# Patient Record
Sex: Female | Born: 1970 | Race: Black or African American | Hispanic: No | Marital: Single | Smoking: Never smoker
Health system: Southern US, Community
[De-identification: ages and names within clinical notes are randomized; demographics above are authoritative.]

## PROBLEM LIST (undated history)

## (undated) ENCOUNTER — Emergency Department (HOSPITAL_COMMUNITY): Admission: EM | Payer: Commercial Managed Care - PPO

## (undated) DIAGNOSIS — E039 Hypothyroidism, unspecified: Secondary | ICD-10-CM

## (undated) DIAGNOSIS — M349 Systemic sclerosis, unspecified: Secondary | ICD-10-CM

## (undated) DIAGNOSIS — K8000 Calculus of gallbladder with acute cholecystitis without obstruction: Secondary | ICD-10-CM

## (undated) DIAGNOSIS — K219 Gastro-esophageal reflux disease without esophagitis: Secondary | ICD-10-CM

## (undated) DIAGNOSIS — E559 Vitamin D deficiency, unspecified: Secondary | ICD-10-CM

## (undated) DIAGNOSIS — E042 Nontoxic multinodular goiter: Secondary | ICD-10-CM

## (undated) DIAGNOSIS — J849 Interstitial pulmonary disease, unspecified: Secondary | ICD-10-CM

## (undated) DIAGNOSIS — J811 Chronic pulmonary edema: Secondary | ICD-10-CM

## (undated) DIAGNOSIS — I10 Essential (primary) hypertension: Secondary | ICD-10-CM

## (undated) DIAGNOSIS — I509 Heart failure, unspecified: Secondary | ICD-10-CM

## (undated) DIAGNOSIS — K3184 Gastroparesis: Secondary | ICD-10-CM

## (undated) DIAGNOSIS — N951 Menopausal and female climacteric states: Secondary | ICD-10-CM

## (undated) HISTORY — DX: Systemic sclerosis, unspecified: M34.9

## (undated) HISTORY — DX: Gastroparesis: K31.84

## (undated) HISTORY — DX: Nontoxic multinodular goiter: E04.2

## (undated) HISTORY — PX: ABDOMINAL HERNIA REPAIR: SHX539

## (undated) HISTORY — DX: Interstitial pulmonary disease, unspecified: J84.9

## (undated) HISTORY — DX: Calculus of gallbladder with acute cholecystitis without obstruction: K80.00

## (undated) HISTORY — DX: Chronic pulmonary edema: J81.1

## (undated) HISTORY — PX: UTERINE FIBROID SURGERY: SHX826

---
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
## 2014-02-16 ENCOUNTER — Other Ambulatory Visit: Payer: Self-pay | Admitting: *Deleted

## 2014-02-16 DIAGNOSIS — R200 Anesthesia of skin: Secondary | ICD-10-CM

## 2014-03-25 ENCOUNTER — Encounter: Payer: Self-pay | Admitting: Neurology

## 2014-04-29 ENCOUNTER — Ambulatory Visit (INDEPENDENT_AMBULATORY_CARE_PROVIDER_SITE_OTHER): Payer: BC Managed Care – PPO | Admitting: Neurology

## 2014-04-29 DIAGNOSIS — M5417 Radiculopathy, lumbosacral region: Secondary | ICD-10-CM

## 2014-04-29 DIAGNOSIS — G5622 Lesion of ulnar nerve, left upper limb: Secondary | ICD-10-CM

## 2014-04-29 DIAGNOSIS — R200 Anesthesia of skin: Secondary | ICD-10-CM

## 2014-04-29 NOTE — Procedures (Signed)
[facility]  [place], [place]  [place], [place] [address] Tel: [phone] Fax:  [phone] Test Date:  04/29/2014  Patient: Karen [name] DOB: 01/14/1971 Physician: Narda Amber, DO  Sex: Female Height: 5\' 6"  Ref Phys: Gavin Pound  ID#: [id number] Temp: 33.6C Technician: Laureen Ochs R. NCS T.   Patient Complaints: Patient is 43 year old female here for evaluation of generalized myalgias in the setting of mildly elevated CK.  NCV & EMG Findings: Extensive electrodiagnostic testing of the left upper, left lower, and additional studies of the right lower extremity shows:  1. All sensory responses in the upper extremity including the left median, ulnar, and radial sensory responses are within normal limits. 2. Left ulnar motor response shows slowed conduction velocity across the elbow, with preserved latency and amplitude. The left median motor responses within normal limits. 3. All sensory responses in the lower extremity including the left sural and superficial peroneal sensory responses are within normal limits. 4. Left peroneal motor response recording at the extensor digitorum brevis is asymmetrically reduced in amplitude. Left tibial (AH) and peroneal (TA) motor responses are within normal limits. Further, right peroneal motor response recording at both the extensor digitorum brevis and tibialis anterior is within normal limits. 5. In the arms, there is no evidence of active or chronic motor axon loss changes. Motor unit recruitment and configuration is within normal limits. 6. In the legs, active motor axon loss changes are seen affecting the L5 myotome bilaterally, with chronic changes isolated to the left flexor digitorum longus muscles. There is no evidence of small, complex, polyphasic motor units affecting any of the muscles tested in the lower extremities.  Impression: 1. Right ulnar neuropathy at the elbow, purely demyelinating in type. Overall, these  findings are mild in degree electrically and patient appears to be asymptomatic. 2. Subacute L5 radiculopathy affecting bilateral lower extremities, mild in degree electrically. Clinical correlation recommended. 3. There is no evidence of a diffuse myopathy or generalized sensorimotor polyneuropathy affecting the left side.   ___________________________ Narda Amber, DO    Nerve Conduction Studies Anti Sensory Summary Table   Site NR Peak (ms) Norm Peak (ms) P-T Amp (V) Norm P-T Amp  Left Median Anti Sensory (2nd Digit)  Wrist    3.0 <3.4 40.3 >20  Left Radial Anti Sensory (Base 1st Digit)  Wrist    2.3 <2.7 46.8 >18  Left Sup Peroneal Anti Sensory (Ant Lat Mall)  12 cm    2.9 <4.5 8.9 >5  Left Sural Anti Sensory (Lat Mall)  Calf    3.9 <4.5 10.2 >5  Left Ulnar Anti Sensory (5th Digit)  Wrist    2.8 <3.1 18.4 >12   Motor Summary Table   Site NR Onset (ms) Norm Onset (ms) O-P Amp (mV) Norm O-P Amp Site1 Site2 Delta-0 (ms) Dist (cm) Vel (m/s) Norm Vel (m/s)  Left Median Motor (Abd Poll Brev)  Wrist    2.9 <3.9 10.0 >6 Elbow Wrist 4.4 27.0 61 >50  Elbow    7.3  9.1         Left Peroneal Motor (Ext Dig Brev)  Ankle    5.2 <5.5 2.3 >3 B Fib Ankle 7.8 34.0 44 >40  B Fib    13.0  2.2  Poplt B Fib 1.7 9.0 53 >40  Poplt    14.7  2.1         Right Peroneal Motor (Ext Dig Brev)  Ankle    4.5 <5.5  4.3 >3 B Fib Ankle 7.9 36.0 46 >40  B Fib    12.4  4.0  Poplt B Fib 1.7 9.0 53 >40  Poplt    14.1  4.0         Left Peroneal TA Motor (Tib Ant)  Fib Head    1.6 <4.0 9.7 >4 Poplit Fib Head 1.5 10.0 67 >40  Poplit    3.1  8.7         Right Peroneal TA Motor (Tib Ant)  Fib Head    2.6 <4.0 6.6 >4 Poplit Fib Head 1.5 8.0 53 >40  Poplit    4.1  6.3         Left Tibial Motor (Abd Hall Brev)  Ankle    4.3 <6.0 10.0 >8 Knee Ankle 8.8 39.0 44 >40  Knee    13.1  8.4         Left Ulnar Motor (Abd Dig Minimi)  Wrist    2.3 <3.1 10.3 >7 B Elbow Wrist 3.7 22.5 61 >50  B Elbow    6.0  9.8  A Elbow  B Elbow 2.2 10.0 45 >50  A Elbow    8.2  9.8         Left Ulnar (FDI) Motor (1st DI)  Wrist    4.0 <4.3 12.1 >7         F Wave Studies   NR F-Lat (ms) Lat Norm (ms) L-R F-Lat (ms)  Left Tibial (Mrkrs) (Abd Hallucis)     54.40 <55   Left Ulnar (Mrkrs) (Abd Dig Min)     27.20 <33    H Reflex Studies   NR H-Lat (ms) Lat Norm (ms) L-R H-Lat (ms)  Left Tibial (Gastroc)     32.24 <35 0.00  Right Tibial (Gastroc)     32.24 <35 0.00   EMG   Side Muscle Ins Act Fibs Psw Fasc Number Recrt Dur Dur. Amp Amp. Poly Poly. Comment  Left 1stDorInt Nml Nml Nml Nml Nml Nml Nml Nml Nml Nml Nml Nml N/A  Left FlexPolLong Nml Nml Nml Nml Nml Nml Nml Nml Nml Nml Nml Nml N/A  Left ABD Dig Min Nml Nml Nml Nml Nml Nml Nml Nml Nml Nml Nml Nml N/A  Left FlexDigProf 4,5 Nml Nml Nml Nml Nml Nml Nml Nml Nml Nml Nml Nml N/A  Left BrachioRad Nml Nml Nml Nml Nml Nml Nml Nml Nml Nml Nml Nml N/A  Left Triceps Nml Nml Nml Nml Nml Nml Nml Nml Nml Nml Nml Nml N/A  Left Deltoid Nml Nml Nml Nml Nml Nml Nml Nml Nml Nml Nml Nml N/A  Left Cervical Parasp Low Nml Nml Nml Nml Nml Nml Nml Nml Nml Nml Nml Nml N/A  Left GluteusMed Nml 1+ Nml Nml Nml Nml Nml Nml Nml Nml Nml Nml N/A  Left BicepsFemS Nml Nml Nml Nml Nml Nml Nml Nml Nml Nml Nml Nml N/A  Left Lumbo Parasp Low Nml Nml Nml Nml Nml Nml Nml Nml Nml Nml Nml Nml N/A  Left Flex Dig Long Nml Nml Nml Nml 1- Mod-R Few 1+ Few 1+ Nml Nml N/A  Left Iliacus Nml Nml Nml Nml Nml Nml Nml Nml Nml Nml Nml Nml N/A  Left AntTibialis Nml 1+ Nml Nml Nml Nml Nml Nml Nml Nml Nml Nml N/A  Left Gastroc Nml Nml Nml Nml Nml Nml Nml Nml Nml Nml Nml Nml N/A  Left RectFemoris Nml Nml Nml Nml Nml Nml Nml Nml Nml Nml  Nml Nml N/A  Right AntTibialis Nml 1+ Nml Nml Nml Nml Nml Nml Nml Nml Nml Nml N/A  Right Gastroc Nml Nml Nml Nml Nml Nml Nml Nml Nml Nml Nml Nml N/A  Right Flex Dig Long 1+ Nml Nml Nml Nml Nml Nml Nml Nml Nml Nml Nml N/A  Right RectFemoris Nml Nml Nml Nml Nml Nml Nml Nml Nml Nml  Nml Nml N/A  Right GluteusMed Nml 1+ Nml Nml Nml Nml Nml Nml Nml Nml Nml Nml N/A      Waveforms:

## 2015-05-14 ENCOUNTER — Other Ambulatory Visit: Payer: Self-pay | Admitting: Internal Medicine

## 2015-05-14 DIAGNOSIS — R928 Other abnormal and inconclusive findings on diagnostic imaging of breast: Secondary | ICD-10-CM

## 2015-05-20 ENCOUNTER — Other Ambulatory Visit: Payer: Self-pay

## 2015-05-24 ENCOUNTER — Other Ambulatory Visit: Payer: Self-pay | Admitting: Internal Medicine

## 2015-05-24 DIAGNOSIS — R928 Other abnormal and inconclusive findings on diagnostic imaging of breast: Secondary | ICD-10-CM

## 2015-05-25 ENCOUNTER — Ambulatory Visit
Admission: RE | Admit: 2015-05-25 | Discharge: 2015-05-25 | Disposition: A | Discharge status: Home / Self Care | Payer: No Typology Code available for payment source | Source: Ambulatory Visit | Attending: Internal Medicine | Admitting: Internal Medicine

## 2015-05-25 DIAGNOSIS — R928 Other abnormal and inconclusive findings on diagnostic imaging of breast: Secondary | ICD-10-CM

## 2015-05-25 DIAGNOSIS — N63 Unspecified lump in breast: Secondary | ICD-10-CM | POA: Diagnosis not present

## 2015-05-25 DIAGNOSIS — N6012 Diffuse cystic mastopathy of left breast: Secondary | ICD-10-CM | POA: Diagnosis not present

## 2015-06-07 MED FILL — AMLODIPINE BESYLATE 2.5 MG: 2.5 | 90 days supply | Qty: 90 | Fill #0

## 2015-06-18 DIAGNOSIS — E860 Dehydration: Secondary | ICD-10-CM | POA: Diagnosis not present

## 2015-06-18 DIAGNOSIS — N200 Calculus of kidney: Secondary | ICD-10-CM | POA: Diagnosis not present

## 2015-06-25 MED FILL — TRIAMTERENE-HCTZ 37.5-25 MG: 37.5-25 | 60 days supply | Qty: 60 | Fill #0

## 2015-08-13 ENCOUNTER — Encounter (HOSPITAL_COMMUNITY): Payer: Self-pay

## 2015-08-13 ENCOUNTER — Emergency Department (HOSPITAL_COMMUNITY)
Admission: EM | Admit: 2015-08-13 | Discharge: 2015-08-13 | Disposition: A | Discharge status: Home / Self Care | Payer: 59 | Attending: Emergency Medicine | Admitting: Emergency Medicine

## 2015-08-13 DIAGNOSIS — Z79899 Other long term (current) drug therapy: Secondary | ICD-10-CM | POA: Diagnosis not present

## 2015-08-13 DIAGNOSIS — R110 Nausea: Secondary | ICD-10-CM | POA: Diagnosis not present

## 2015-08-13 DIAGNOSIS — R51 Headache: Secondary | ICD-10-CM | POA: Insufficient documentation

## 2015-08-13 DIAGNOSIS — G43909 Migraine, unspecified, not intractable, without status migrainosus: Secondary | ICD-10-CM | POA: Diagnosis not present

## 2015-08-13 DIAGNOSIS — G4489 Other headache syndrome: Secondary | ICD-10-CM | POA: Diagnosis not present

## 2015-08-13 DIAGNOSIS — R52 Pain, unspecified: Secondary | ICD-10-CM | POA: Diagnosis not present

## 2015-08-13 DIAGNOSIS — R42 Dizziness and giddiness: Secondary | ICD-10-CM | POA: Diagnosis not present

## 2015-08-13 DIAGNOSIS — I10 Essential (primary) hypertension: Secondary | ICD-10-CM | POA: Insufficient documentation

## 2015-08-13 DIAGNOSIS — R519 Headache, unspecified: Secondary | ICD-10-CM

## 2015-08-13 HISTORY — DX: Essential (primary) hypertension: I10

## 2015-08-13 MED ORDER — KETOROLAC TROMETHAMINE 30 MG/ML IJ SOLN
30.0000 mg | Freq: Once | INTRAMUSCULAR | Status: DC
  Filled 2015-08-13: qty 1

## 2015-08-13 MED ORDER — ONDANSETRON HCL 4 MG/2ML IJ SOLN
4.0000 mg | Freq: Once | INTRAMUSCULAR | Status: DC
  Filled 2015-08-13: qty 2

## 2015-08-13 MED ORDER — BUTALBITAL-APAP-CAFFEINE 50-325-40 MG PO TABS: 1.0000 | ORAL_TABLET | Freq: Four times a day (QID) | ORAL | Status: AC | PRN

## 2015-08-13 MED ORDER — DIPHENHYDRAMINE HCL 50 MG/ML IJ SOLN
25.0000 mg | Freq: Once | INTRAMUSCULAR | Status: DC
  Filled 2015-08-13: qty 1

## 2015-08-13 MED ORDER — BUTALBITAL-APAP-CAFFEINE 50-325-40 MG PO TABS
1.0000 | ORAL_TABLET | Freq: Once | ORAL | Status: AC
  Administered 2015-08-13: 1 via ORAL
  Filled 2015-08-13: qty 1

## 2015-08-13 MED ORDER — ONDANSETRON 4 MG PO TBDP
4.0000 mg | ORAL_TABLET | Freq: Once | ORAL | Status: AC
  Administered 2015-08-13: 4 mg via ORAL
  Filled 2015-08-13: qty 1

## 2015-08-13 MED ORDER — PROCHLORPERAZINE EDISYLATE 5 MG/ML IJ SOLN
10.0000 mg | Freq: Once | INTRAMUSCULAR | Status: DC
  Filled 2015-08-13: qty 2

## 2015-08-13 MED ORDER — SODIUM CHLORIDE 0.9 % IV BOLUS (SEPSIS): 1000.0000 mL | Freq: Once | INTRAVENOUS | Status: DC

## 2015-08-13 MED ORDER — ONDANSETRON 4 MG PO TBDP: 4.0000 mg | ORAL_TABLET | Freq: Three times a day (TID) | ORAL | Status: DC | PRN

## 2015-08-13 NOTE — ED Notes (Signed)
Per EMS - pt is coming from work at [employer] C/o headache w/ dizziness and nausea. Hx of same. Began yesterday afternoon, symptoms worse today. BP 183/110 at facility, EMS BP 142/106. Negative stroke screen.

## 2015-08-13 NOTE — ED Notes (Signed)
This RN attempted IV access x 1. Shawn, PA attempted IV access w/ [place] x 1. No success w/ IV start. Phleb to draw labs. Will give PO meds to attempt to alleviate pain.

## 2015-08-13 NOTE — Discharge Instructions (Signed)
You have been seen today for a headache. Follow up with PCP as soon as possible for reevaluation and chronic management. You will also need to have your blood pressure reevaluated for possible medication adjustment. Return to ED should symptoms worsen.

## 2015-08-13 NOTE — ED Provider Notes (Signed)
CSN: [id number]     Arrival date & time 08/13/15  1443 History   First MD Initiated Contact with Patient 08/13/15 1542     Chief Complaint  Patient presents with  . Headache     (Consider location/radiation/quality/duration/timing/severity/associated sxs/prior Treatment) HPI   Karen [name] is a 45 y.o. female, with a history of hypertension and headaches, presenting to the ED with a headache since yesterday afternoon. Pain is frontal on the right, rates the pain at 5/10, throbbing, nonradiating. Pt states this feels like her previous headaches she has had in the past. Pain is accompanied by nausea and dizziness. Pt takes Norvasc 10 mg qd and HCTZ 12.5 mg qd for her BP and states she took both this morning, as directed. Pt denies vomiting, neuro deficits, LOC, visual disturbances, chest pain, shortness of breath, or any other complaints.    Past Medical History  Diagnosis Date  . Hypertension    No past surgical history on file. No family history on file. Social History  Substance Use Topics  . Smoking status: Never Smoker   . Smokeless tobacco: Not on file  . Alcohol Use: No   OB History    No data available     Review of Systems  Constitutional: Negative for fever, chills and diaphoresis.  Respiratory: Negative for shortness of breath.   Cardiovascular: Negative for chest pain.  Gastrointestinal: Positive for nausea. Negative for vomiting.  Neurological: Positive for dizziness and headaches. Negative for weakness and numbness.  All other systems reviewed and are negative.     Allergies  Lisinopril  Home Medications   Prior to Admission medications   Medication Sig Start Date End Date Taking? Authorizing Provider  amLODipine (NORVASC) 2.5 MG tablet  06/07/15  Yes Historical Provider, MD  triamterene-hydrochlorothiazide (MAXZIDE-25) 37.5-25 MG tablet  06/25/15  Yes Historical Provider, MD  Vitamin D, Ergocalciferol, (DRISDOL) 50000 units CAPS capsule Take 50,000  Units by mouth every 7 (seven) days.   Yes Historical Provider, MD   BP 125/88 mmHg  Pulse 89  Temp(Src) 97.8 F (36.6 C) (Oral)  Resp 20  Ht 5\' 6"  (1.676 m)  Wt 67.586 kg  BMI 24.06 kg/m2  SpO2 100%  LMP 08/02/2015 (Approximate) Physical Exam  Constitutional: She is oriented to person, place, and time. She appears well-developed and well-nourished. No distress.  HENT:  Head: Normocephalic and atraumatic.  Eyes: Conjunctivae and EOM are normal. Pupils are equal, round, and reactive to light.  Neck: Normal range of motion. Neck supple.  Cardiovascular: Normal rate, regular rhythm, normal heart sounds and intact distal pulses.   Pulmonary/Chest: Effort normal and breath sounds normal. No respiratory distress.  Abdominal: Soft. There is no tenderness. There is no guarding.  Musculoskeletal: She exhibits no edema or tenderness.  Full ROM in all extremities and spine. No paraspinal tenderness.   Lymphadenopathy:    She has no cervical adenopathy.  Neurological: She is alert and oriented to person, place, and time. She has normal reflexes.  No sensory deficits. Strength 5/5 in all extremities. No gait disturbance. Coordination intact. Cranial nerves III-XII grossly intact. No facial droop.   Skin: Skin is warm and dry. She is not diaphoretic.  Psychiatric: She has a normal mood and affect. Her behavior is normal.  Nursing note and vitals reviewed.   ED Course  Procedures (including critical care time)  Emergency Ultrasound:  US Guidance for needle guidance  Performed by Lorayne Bender Indication: Poor IV access  Linear probe used in real-time  to visualize location of needle entry through skin. Interpretation: 1 probable vein found, however, patient would not tolerate the procedure. Image archived electronically.  Angiocath insertion Performed by: Lorayne Bender  Consent: Verbal consent obtained. Risks and benefits: risks, benefits and alternatives were discussed Time out:  Immediately prior to procedure a "time out" was called to verify the correct patient, procedure, equipment, support staff and site/side marked as required.  Preparation: Patient was prepped and draped in the usual sterile fashion.  Vein Location: Right upper arm  Ultrasound Guided  Gauge: 20 ga  Attempt was unsuccessful due to patient being unable to tolerate the procedure.    Labs Review Labs Reviewed  CBC WITH DIFFERENTIAL/PLATELET  URINALYSIS, ROUTINE W REFLEX MICROSCOPIC (NOT AT [facility])    Imaging Review No results found. I have personally reviewed and evaluated these lab results as part of my medical decision-making.   EKG Interpretation None       Medications  sodium chloride 0.9 % bolus 1,000 mL (0 mLs Intravenous Hold 08/13/15 1640)  ondansetron (ZOFRAN) injection 4 mg (0 mg Intravenous Hold 08/13/15 1640)  ketorolac (TORADOL) 30 MG/ML injection 30 mg (0 mg Intravenous Hold 08/13/15 1641)  prochlorperazine (COMPAZINE) injection 10 mg (0 mg Intravenous Hold 08/13/15 1641)  diphenhydrAMINE (BENADRYL) injection 25 mg (0 mg Intravenous Hold 08/13/15 1640)  butalbital-acetaminophen-caffeine (FIORICET, ESGIC) 50-325-40 MG per tablet 1 tablet (1 tablet Oral Given 08/13/15 1650)  ondansetron (ZOFRAN-ODT) disintegrating tablet 4 mg (4 mg Oral Given 08/13/15 1727)    MDM   Final diagnoses:  Acute nonintractable headache, unspecified headache type    Karen [name] presents with a headache similar to her previous headaches that began yesterday.  This patient's presentation and history suggest that the patient's headache is a migraine type headache. Patient given migraine cocktail. Basic labs ordered. No indication for imaging at this time. Patient will be advised to follow-up with her PCP as soon as possible after discharge for possible medication adjustment. Patient has no neuro or functional deficits. No red flag symptoms. No indication of hypertensive emergency. Labs were ordered, but  were unable to be drawn due to poor vascular access. Patient was given Fioricet and Zofran instead. Upon reassessment, patient states that her pain has resolved. Patient voices no other complaints. Patient to follow-up with PCP to reevaluate her hypertension and medications. Return precautions discussed. Patient voiced understanding of these instructions and is comfortable with discharge.  Filed Vitals:   08/13/15 1451 08/13/15 1451 08/13/15 1618 08/13/15 1727  BP:  134/93 129/93 125/88  Pulse:  97 95 89  Temp:  97.8 F (36.6 C)    TempSrc:  Oral    Resp:  20 20 20   Height: 5\' 6"  (1.676 m)     Weight: 67.586 kg     SpO2:  98% 98% 100%       Lorayne Bender, PA-C 08/13/15 1800  Virgel Manifold, MD 08/15/15 1547

## 2015-08-13 NOTE — ED Notes (Signed)
Pt verbalized understanding of d/c instructions, prescriptions, and follow-up care. No further questions/concerns, VSS, ambulatory w/ steady gait (refused wheelchair) 

## 2015-08-17 DIAGNOSIS — I10 Essential (primary) hypertension: Secondary | ICD-10-CM | POA: Diagnosis not present

## 2015-08-17 DIAGNOSIS — Z6823 Body mass index (BMI) 23.0-23.9, adult: Secondary | ICD-10-CM | POA: Diagnosis not present

## 2015-09-23 MED FILL — VIT D2 1.25 MG (50,000 UNIT: 1.25 MG | 84 days supply | Qty: 12 | Fill #0

## 2015-09-24 MED FILL — TRIAMTERENE/HCTZ 37.5/25 TB: 37.5-25 | 90 days supply | Qty: 90 | Fill #0

## 2015-10-13 MED FILL — AMLODIPINE BESYLATE 2.5 MG: 2.5 | 60 days supply | Qty: 60 | Fill #1

## 2015-11-19 DIAGNOSIS — E784 Other hyperlipidemia: Secondary | ICD-10-CM | POA: Diagnosis not present

## 2015-11-19 DIAGNOSIS — M158 Other polyosteoarthritis: Secondary | ICD-10-CM | POA: Diagnosis not present

## 2015-11-19 DIAGNOSIS — I10 Essential (primary) hypertension: Secondary | ICD-10-CM | POA: Diagnosis not present

## 2015-11-19 DIAGNOSIS — Z79899 Other long term (current) drug therapy: Secondary | ICD-10-CM | POA: Diagnosis not present

## 2015-11-19 DIAGNOSIS — Z6823 Body mass index (BMI) 23.0-23.9, adult: Secondary | ICD-10-CM | POA: Diagnosis not present

## 2015-12-15 MED FILL — VIT D2 1.25 MG (50,000 UNIT: 1.25 MG | 84 days supply | Qty: 12 | Fill #0

## 2015-12-24 MED FILL — AMLODIPINE BESYLATE 5 MG TA: 5 | 90 days supply | Qty: 90 | Fill #0

## 2015-12-24 MED FILL — TRIAMTERENE/HCTZ 37.5/25 TB: 37.5-25 | 90 days supply | Qty: 90 | Fill #0

## 2016-02-25 DIAGNOSIS — M158 Other polyosteoarthritis: Secondary | ICD-10-CM | POA: Diagnosis not present

## 2016-02-25 DIAGNOSIS — Z6823 Body mass index (BMI) 23.0-23.9, adult: Secondary | ICD-10-CM | POA: Diagnosis not present

## 2016-02-25 DIAGNOSIS — I10 Essential (primary) hypertension: Secondary | ICD-10-CM | POA: Diagnosis not present

## 2016-02-25 DIAGNOSIS — E784 Other hyperlipidemia: Secondary | ICD-10-CM | POA: Diagnosis not present

## 2016-03-01 MED FILL — VIT D2 1.25 MG (50,000 UNIT: 1.25 MG | 84 days supply | Qty: 12 | Fill #0

## 2016-04-14 DIAGNOSIS — I10 Essential (primary) hypertension: Secondary | ICD-10-CM | POA: Insufficient documentation

## 2016-04-14 DIAGNOSIS — Z6824 Body mass index (BMI) 24.0-24.9, adult: Secondary | ICD-10-CM | POA: Diagnosis not present

## 2016-04-14 DIAGNOSIS — Z1231 Encounter for screening mammogram for malignant neoplasm of breast: Secondary | ICD-10-CM | POA: Diagnosis not present

## 2016-04-14 DIAGNOSIS — Z01419 Encounter for gynecological examination (general) (routine) without abnormal findings: Secondary | ICD-10-CM | POA: Diagnosis not present

## 2016-04-14 DIAGNOSIS — N926 Irregular menstruation, unspecified: Secondary | ICD-10-CM | POA: Diagnosis not present

## 2016-05-02 MED FILL — TRIAMTERENE-HCTZ 37.5-25 MG: 37.5-25 | 90 days supply | Qty: 90 | Fill #0

## 2016-06-02 MED FILL — VIT D2 1.25 MG (50,000 UNIT: 1.25 MG | 84 days supply | Qty: 12 | Fill #0

## 2016-06-20 DIAGNOSIS — H16143 Punctate keratitis, bilateral: Secondary | ICD-10-CM | POA: Diagnosis not present

## 2016-06-20 DIAGNOSIS — H524 Presbyopia: Secondary | ICD-10-CM | POA: Diagnosis not present

## 2016-06-20 DIAGNOSIS — H5213 Myopia, bilateral: Secondary | ICD-10-CM | POA: Diagnosis not present

## 2016-06-29 DIAGNOSIS — I10 Essential (primary) hypertension: Secondary | ICD-10-CM | POA: Diagnosis not present

## 2016-06-29 DIAGNOSIS — H02055 Trichiasis without entropian left lower eyelid: Secondary | ICD-10-CM | POA: Diagnosis not present

## 2016-06-29 DIAGNOSIS — E784 Other hyperlipidemia: Secondary | ICD-10-CM | POA: Diagnosis not present

## 2016-06-29 DIAGNOSIS — Z6824 Body mass index (BMI) 24.0-24.9, adult: Secondary | ICD-10-CM | POA: Diagnosis not present

## 2016-06-29 DIAGNOSIS — H02052 Trichiasis without entropian right lower eyelid: Secondary | ICD-10-CM | POA: Diagnosis not present

## 2016-06-29 DIAGNOSIS — H16143 Punctate keratitis, bilateral: Secondary | ICD-10-CM | POA: Diagnosis not present

## 2016-06-29 DIAGNOSIS — M158 Other polyosteoarthritis: Secondary | ICD-10-CM | POA: Diagnosis not present

## 2016-07-25 ENCOUNTER — Ambulatory Visit (HOSPITAL_COMMUNITY)
Admission: EM | Admit: 2016-07-25 | Discharge: 2016-07-25 | Disposition: A | Discharge status: Home / Self Care | Payer: 59 | Attending: Family Medicine | Admitting: Family Medicine

## 2016-07-25 ENCOUNTER — Encounter (HOSPITAL_COMMUNITY): Payer: Self-pay | Admitting: Family Medicine

## 2016-07-25 DIAGNOSIS — H1031 Unspecified acute conjunctivitis, right eye: Secondary | ICD-10-CM | POA: Diagnosis not present

## 2016-07-25 MED ORDER — CIPROFLOXACIN HCL 0.3 % OP SOLN: OPHTHALMIC | 0 refills | Status: DC

## 2016-07-25 NOTE — ED Triage Notes (Signed)
Pt here for redness, swelling and drainage from right eye.

## 2016-07-25 NOTE — ED Provider Notes (Signed)
CSN: [id number]     Arrival date & time 07/25/16  1715 History   First MD Initiated Contact with Patient 07/25/16 1739     Chief Complaint  Patient presents with  . Eye Problem   (Consider location/radiation/quality/duration/timing/severity/associated sxs/prior Treatment) 46 year old female presents to clinic with a 24-hour history of right I discharge. She denies any difficulty seeing, she denies any changes in her vision, she has no pain, she does state that she has had discharge, and she is cleaned her eye several times throughout the day.   The history is provided by the patient.  Eye Problem  Associated symptoms: discharge, itching and redness   Associated symptoms: no headaches, no nausea, no photophobia, no vomiting and no weakness     Past Medical History:  Diagnosis Date  . Hypertension    History reviewed. No pertinent surgical history. History reviewed. No pertinent family history. Social History  Substance Use Topics  . Smoking status: Never Smoker  . Smokeless tobacco: Never Used  . Alcohol use No   OB History    No data available     Review of Systems  Constitutional: Negative for chills and fever.  HENT: Negative for congestion, sinus pain and sinus pressure.   Eyes: Positive for discharge, redness and itching. Negative for photophobia, pain and visual disturbance.  Gastrointestinal: Negative for abdominal pain, diarrhea, nausea and vomiting.  Neurological: Negative for dizziness, weakness, light-headedness and headaches.  All other systems reviewed and are negative.   Allergies  Lisinopril  Home Medications   Prior to Admission medications   Medication Sig Start Date End Date Taking? Authorizing Provider  amLODipine (NORVASC) 2.5 MG tablet  06/07/15   Historical Provider, MD  butalbital-acetaminophen-caffeine (FIORICET) 50-325-40 MG tablet Take 1-2 tablets by mouth every 6 (six) hours as needed for headache. 08/13/15 08/12/16  Shawn C Joy, PA-C   ciprofloxacin (CILOXAN) 0.3 % ophthalmic solution Administer 2 drop, every 2 hours, while awake, for 2 days. Then 2 drop, every 4 hours, while awake, for the next 5 days. 07/25/16   Barnet Glasgow, NP  ondansetron (ZOFRAN ODT) 4 MG disintegrating tablet Take 1 tablet (4 mg total) by mouth every 8 (eight) hours as needed for nausea or vomiting. 08/13/15   Lorayne Bender, PA-C  triamterene-hydrochlorothiazide (MAXZIDE-25) 37.5-25 MG tablet  06/25/15   Historical Provider, MD  Vitamin D, Ergocalciferol, (DRISDOL) 50000 units CAPS capsule Take 50,000 Units by mouth every 7 (seven) days.    Historical Provider, MD   Meds Ordered and Administered this Visit  Medications - No data to display  BP (!) 141/99   Pulse 88   Temp 98.3 F (36.8 C) (Oral)   Resp 18   SpO2 100%  No data found.   Physical Exam  Constitutional: She is oriented to person, place, and time. She appears well-developed and well-nourished. No distress.  HENT:  Head: Normocephalic and atraumatic.  Right Ear: External ear normal.  Left Ear: External ear normal.  Eyes: EOM are normal. Pupils are equal, round, and reactive to light. Right eye exhibits discharge and exudate. Left eye exhibits no discharge and no exudate. Right conjunctiva is injected. Right conjunctiva has no hemorrhage. Left conjunctiva is not injected. Left conjunctiva has no hemorrhage.  Neck: Normal range of motion. Neck supple. No JVD present.  Cardiovascular: Normal rate and regular rhythm.   Pulmonary/Chest: Effort normal and breath sounds normal.  Lymphadenopathy:    She has no cervical adenopathy.  Neurological: She is alert and oriented to  person, place, and time.  Skin: Skin is warm and dry. Capillary refill takes less than 2 seconds. She is not diaphoretic. No erythema. No pallor.  Psychiatric: She has a normal mood and affect. Her behavior is normal.  Nursing note and vitals reviewed.   Urgent Care Course     Procedures (including critical care  time)  Labs Review Labs Reviewed - No data to display  Imaging Review No results found.    MDM   1. Acute bacterial conjunctivitis of right eye     Treating for bacterial conjunctivitis. Prescribed Cipro eyedrops. Advised to follow-up with primary care provider if symptoms persist past one week. Furthermore, advised to go to the emergency if at any time she has change in vision, eye pain, loss of vision, or any other symptoms.    Barnet Glasgow, NP 07/25/16 1756

## 2016-07-25 NOTE — Discharge Instructions (Signed)
I'm treating you for bacterial conjunctivitis. I prescribed Cipro eyedrops. Place 2 drops in the affected eye every 2 hours while awake for 2 days, then 1-2 drops in the affected eye every 4 hours for the next 5 days. Should your symptoms persist, or fail to resolve, follow up with your primary care provider in one week.

## 2016-08-03 DIAGNOSIS — H538 Other visual disturbances: Secondary | ICD-10-CM | POA: Diagnosis not present

## 2016-08-03 DIAGNOSIS — H10231 Serous conjunctivitis, except viral, right eye: Secondary | ICD-10-CM | POA: Diagnosis not present

## 2016-08-15 MED FILL — TRIAMTERENE-HCTZ 37.5-25 MG: 37.5-25 | 30 days supply | Qty: 30 | Fill #1

## 2016-08-16 MED FILL — VIT D2 1.25 MG (50,000 UNIT: 1.25 MG | 27 days supply | Qty: 4 | Fill #0

## 2016-09-08 DIAGNOSIS — D3101 Benign neoplasm of right conjunctiva: Secondary | ICD-10-CM | POA: Diagnosis not present

## 2016-09-08 DIAGNOSIS — H01003 Unspecified blepharitis right eye, unspecified eyelid: Secondary | ICD-10-CM | POA: Diagnosis not present

## 2016-09-08 MED FILL — NEO/POLY/DEXAMET EYE OINT: 3.5-10000-0 | 5 days supply | Qty: 4 | Fill #0

## 2016-09-08 MED FILL — VIT D2 1.25 MG (50,000 UNIT: 1.25 MG | 27 days supply | Qty: 4 | Fill #1

## 2016-09-21 MED FILL — SM ACID REDUCER 200 MG TAB: 200 | 30 days supply | Qty: 60 | Fill #0

## 2016-09-27 MED FILL — TRIAMTERENE-HCTZ 37.5-25 MG: 37.5-25 | 90 days supply | Qty: 90 | Fill #0

## 2016-09-28 DIAGNOSIS — Z6823 Body mass index (BMI) 23.0-23.9, adult: Secondary | ICD-10-CM | POA: Diagnosis not present

## 2016-09-28 DIAGNOSIS — M158 Other polyosteoarthritis: Secondary | ICD-10-CM | POA: Diagnosis not present

## 2016-09-28 DIAGNOSIS — I10 Essential (primary) hypertension: Secondary | ICD-10-CM | POA: Diagnosis not present

## 2016-09-28 DIAGNOSIS — E784 Other hyperlipidemia: Secondary | ICD-10-CM | POA: Diagnosis not present

## 2016-09-28 DIAGNOSIS — Z79899 Other long term (current) drug therapy: Secondary | ICD-10-CM | POA: Diagnosis not present

## 2016-09-29 DIAGNOSIS — D3101 Benign neoplasm of right conjunctiva: Secondary | ICD-10-CM | POA: Diagnosis not present

## 2016-10-11 MED FILL — VIT D2 1.25 MG (50,000 UNIT: 1.25 MG | 28 days supply | Qty: 4 | Fill #2

## 2016-10-26 DIAGNOSIS — Z114 Encounter for screening for human immunodeficiency virus [HIV]: Secondary | ICD-10-CM | POA: Diagnosis not present

## 2016-10-26 DIAGNOSIS — Z113 Encounter for screening for infections with a predominantly sexual mode of transmission: Secondary | ICD-10-CM | POA: Diagnosis not present

## 2016-10-26 DIAGNOSIS — L989 Disorder of the skin and subcutaneous tissue, unspecified: Secondary | ICD-10-CM | POA: Diagnosis not present

## 2016-10-26 DIAGNOSIS — N760 Acute vaginitis: Secondary | ICD-10-CM | POA: Diagnosis not present

## 2016-10-26 MED FILL — ACYCLOVIR 400 MG TABLET: 400 | 10 days supply | Qty: 30 | Fill #0

## 2016-11-16 MED FILL — VIT D2 1.25 MG (50,000 UNIT: 1.25 MG | 28 days supply | Qty: 4 | Fill #0

## 2016-12-27 MED FILL — VIT D2 1.25 MG (50,000 UNIT: 1.25 MG | 28 days supply | Qty: 4 | Fill #1

## 2017-01-23 MED FILL — TRIAMTERENE/HCTZ 37.5/25 TB: 37.5-25 | 30 days supply | Qty: 30 | Fill #1

## 2017-01-23 MED FILL — VIT D2 1.25 MG (50,000 UNIT: 1.25 MG | 28 days supply | Qty: 4 | Fill #2

## 2017-02-01 DIAGNOSIS — Z6822 Body mass index (BMI) 22.0-22.9, adult: Secondary | ICD-10-CM | POA: Diagnosis not present

## 2017-02-01 DIAGNOSIS — R5383 Other fatigue: Secondary | ICD-10-CM | POA: Diagnosis not present

## 2017-02-01 DIAGNOSIS — I10 Essential (primary) hypertension: Secondary | ICD-10-CM | POA: Diagnosis not present

## 2017-02-01 DIAGNOSIS — M158 Other polyosteoarthritis: Secondary | ICD-10-CM | POA: Diagnosis not present

## 2017-02-01 DIAGNOSIS — E784 Other hyperlipidemia: Secondary | ICD-10-CM | POA: Diagnosis not present

## 2017-03-05 MED FILL — VIT D2 1.25 MG (50,000 UNIT: 1.25 MG | 28 days supply | Qty: 4 | Fill #0

## 2017-03-05 MED FILL — TRIAMTERENE/HCTZ 37.5/25 TB: 37.5-25 | 90 days supply | Qty: 90 | Fill #0

## 2017-03-06 DIAGNOSIS — R21 Rash and other nonspecific skin eruption: Secondary | ICD-10-CM | POA: Diagnosis not present

## 2017-03-06 DIAGNOSIS — R5383 Other fatigue: Secondary | ICD-10-CM | POA: Diagnosis not present

## 2017-03-06 DIAGNOSIS — M545 Low back pain: Secondary | ICD-10-CM | POA: Diagnosis not present

## 2017-03-06 DIAGNOSIS — M79604 Pain in right leg: Secondary | ICD-10-CM | POA: Diagnosis not present

## 2017-03-06 DIAGNOSIS — I7300 Raynaud's syndrome without gangrene: Secondary | ICD-10-CM | POA: Diagnosis not present

## 2017-03-06 DIAGNOSIS — Z6824 Body mass index (BMI) 24.0-24.9, adult: Secondary | ICD-10-CM | POA: Diagnosis not present

## 2017-03-06 DIAGNOSIS — M359 Systemic involvement of connective tissue, unspecified: Secondary | ICD-10-CM | POA: Diagnosis not present

## 2017-03-06 DIAGNOSIS — M79605 Pain in left leg: Secondary | ICD-10-CM | POA: Diagnosis not present

## 2017-03-20 ENCOUNTER — Ambulatory Visit (INDEPENDENT_AMBULATORY_CARE_PROVIDER_SITE_OTHER): Payer: 59 | Admitting: Cardiovascular Disease

## 2017-03-20 ENCOUNTER — Encounter: Payer: Self-pay | Admitting: Cardiovascular Disease

## 2017-03-20 VITALS — BP 138/80 | HR 99 | Ht 65.0 in | Wt 140.0 lb

## 2017-03-20 DIAGNOSIS — I10 Essential (primary) hypertension: Secondary | ICD-10-CM

## 2017-03-20 DIAGNOSIS — Z13220 Encounter for screening for lipoid disorders: Secondary | ICD-10-CM

## 2017-03-20 MED ORDER — NEBIVOLOL HCL 5 MG PO TABS
5.0000 mg | ORAL_TABLET | Freq: Every day | ORAL | 3 refills | Status: DC
Start: 2017-03-20 — End: 2017-04-26

## 2017-03-20 MED FILL — BYSTOLIC 5 MG TABLET: 5 | 30 days supply | Qty: 30 | Fill #0

## 2017-03-20 NOTE — Patient Instructions (Addendum)
Medication Instructions:  Your physician has recommended you make the following change in your medication:  START Bystolic 5 mg once daily   Labwork: None Ordered   Testing/Procedures: None Ordered   Follow-Up: Your physician recommends that you schedule a follow-up appointment in: 4 weeks with Dr. Acie Fredrickson   If you need a refill on your cardiac medications before your next appointment, please call your pharmacy.   Thank you for choosing [facility]! Christen Bame, RN [phone]    DASH Eating Plan DASH stands for "Dietary Approaches to Stop Hypertension." The DASH eating plan is a healthy eating plan that has been shown to reduce high blood pressure (hypertension). It may also reduce your risk for type 2 diabetes, heart disease, and stroke. The DASH eating plan may also help with weight loss. What are tips for following this plan? General guidelines  Avoid eating more than 2,300 mg (milligrams) of salt (sodium) a day. If you have hypertension, you may need to reduce your sodium intake to 1,500 mg a day.  Limit alcohol intake to no more than 1 drink a day for nonpregnant women and 2 drinks a day for men. One drink equals 12 oz of beer, 5 oz of wine, or 1 oz of hard liquor.  Work with your health care provider to maintain a healthy body weight or to lose weight. Ask what an ideal weight is for you.  Get at least 30 minutes of exercise that causes your heart to beat faster (aerobic exercise) most days of the week. Activities may include walking, swimming, or biking.  Work with your health care provider or diet and nutrition specialist (dietitian) to adjust your eating plan to your individual calorie needs. Reading food labels  Check food labels for the amount of sodium per serving. Choose foods with less than 5 percent of the Daily Value of sodium. Generally, foods with less than 300 mg of sodium per serving fit into this eating plan.  To find whole grains, look for  the word "whole" as the first word in the ingredient list. Shopping  Buy products labeled as "low-sodium" or "no salt added."  Buy fresh foods. Avoid canned foods and premade or frozen meals. Cooking  Avoid adding salt when cooking. Use salt-free seasonings or herbs instead of table salt or sea salt. Check with your health care provider or pharmacist before using salt substitutes.  Do not fry foods. Cook foods using healthy methods such as baking, boiling, grilling, and broiling instead.  Cook with heart-healthy oils, such as olive, canola, soybean, or sunflower oil. Meal planning   Eat a balanced diet that includes: ? 5 or more servings of fruits and vegetables each day. At each meal, try to fill half of your plate with fruits and vegetables. ? Up to 6-8 servings of whole grains each day. ? Less than 6 oz of lean meat, poultry, or fish each day. A 3-oz serving of meat is about the same size as a deck of cards. One egg equals 1 oz. ? 2 servings of low-fat dairy each day. ? A serving of nuts, seeds, or beans 5 times each week. ? Heart-healthy fats. Healthy fats called Omega-3 fatty acids are found in foods such as flaxseeds and coldwater fish, like sardines, salmon, and mackerel.  Limit how much you eat of the following: ? Canned or prepackaged foods. ? Food that is high in trans fat, such as fried foods. ? Food that is high in saturated fat, such as fatty meat. ?  Sweets, desserts, sugary drinks, and other foods with added sugar. ? Full-fat dairy products.  Do not salt foods before eating.  Try to eat at least 2 vegetarian meals each week.  Eat more home-cooked food and less restaurant, buffet, and fast food.  When eating at a restaurant, ask that your food be prepared with less salt or no salt, if possible. What foods are recommended? The items listed may not be a complete list. Talk with your dietitian about what dietary choices are best for you. Grains Whole-grain or  whole-wheat bread. Whole-grain or whole-wheat pasta. Brown rice. [place]row. Bulgur. Whole-grain and low-sodium cereals. Pita bread. Low-fat, low-sodium crackers. Whole-wheat flour tortillas. Vegetables Fresh or frozen vegetables (raw, steamed, roasted, or grilled). Low-sodium or reduced-sodium tomato and vegetable juice. Low-sodium or reduced-sodium tomato sauce and tomato paste. Low-sodium or reduced-sodium canned vegetables. Fruits All fresh, dried, or frozen fruit. Canned fruit in natural juice (without added sugar). Meat and other protein foods Skinless chicken or [place]. Ground chicken or [place]. Pork with fat trimmed off. Fish and seafood. Egg whites. Dried beans, peas, or lentils. Unsalted nuts, nut butters, and seeds. Unsalted canned beans. Lean cuts of beef with fat trimmed off. Low-sodium, lean deli meat. Dairy Low-fat (1%) or fat-free (skim) milk. Fat-free, low-fat, or reduced-fat cheeses. Nonfat, low-sodium ricotta or cottage cheese. Low-fat or nonfat yogurt. Low-fat, low-sodium cheese. Fats and oils Soft margarine without trans fats. Vegetable oil. Low-fat, reduced-fat, or light mayonnaise and salad dressings (reduced-sodium). Canola, safflower, olive, soybean, and sunflower oils. Avocado. Seasoning and other foods Herbs. Spices. Seasoning mixes without salt. Unsalted popcorn and pretzels. Fat-free sweets. What foods are not recommended? The items listed may not be a complete list. Talk with your dietitian about what dietary choices are best for you. Grains Baked goods made with fat, such as croissants, muffins, or some breads. Dry pasta or rice meal packs. Vegetables Creamed or fried vegetables. Vegetables in a cheese sauce. Regular canned vegetables (not low-sodium or reduced-sodium). Regular canned tomato sauce and paste (not low-sodium or reduced-sodium). Regular tomato and vegetable juice (not low-sodium or reduced-sodium). Angie Fava. Olives. Fruits Canned fruit in a light  or heavy syrup. Fried fruit. Fruit in cream or butter sauce. Meat and other protein foods Fatty cuts of meat. Ribs. Fried meat. Berniece Salines. Sausage. Bologna and other processed lunch meats. Salami. Fatback. Hotdogs. Bratwurst. Salted nuts and seeds. Canned beans with added salt. Canned or smoked fish. Whole eggs or egg yolks. Chicken or [place] with skin. Dairy Whole or 2% milk, cream, and half-and-half. Whole or full-fat cream cheese. Whole-fat or sweetened yogurt. Full-fat cheese. Nondairy creamers. Whipped toppings. Processed cheese and cheese spreads. Fats and oils Butter. Stick margarine. Lard. Shortening. Ghee. Bacon fat. Tropical oils, such as coconut, palm kernel, or palm oil. Seasoning and other foods Salted popcorn and pretzels. Onion salt, garlic salt, seasoned salt, table salt, and sea salt. Worcestershire sauce. Tartar sauce. Barbecue sauce. Teriyaki sauce. Soy sauce, including reduced-sodium. Steak sauce. Canned and packaged gravies. Fish sauce. Oyster sauce. Cocktail sauce. Horseradish that you find on the shelf. Ketchup. Mustard. Meat flavorings and tenderizers. Bouillon cubes. Hot sauce and Tabasco sauce. Premade or packaged marinades. Premade or packaged taco seasonings. Relishes. Regular salad dressings. Where to find more information:  National Heart, Lung, and [place]: https://wilson-eaton.com/  American Heart Association: www.heart.org Summary  The DASH eating plan is a healthy eating plan that has been shown to reduce high blood pressure (hypertension). It may also reduce your risk for type 2 diabetes,  heart disease, and stroke.  With the DASH eating plan, you should limit salt (sodium) intake to 2,300 mg a day. If you have hypertension, you may need to reduce your sodium intake to 1,500 mg a day.  When on the DASH eating plan, aim to eat more fresh fruits and vegetables, whole grains, lean proteins, low-fat dairy, and heart-healthy fats.  Work with your health care provider or  diet and nutrition specialist (dietitian) to adjust your eating plan to your individual calorie needs. This information is not intended to replace advice given to you by your health care provider. Make sure you discuss any questions you have with your health care provider. Document Released: 04/13/2011 Document Revised: 04/17/2016 Document Reviewed: 04/17/2016 Elsevier Interactive Patient Education  2017 [employer].

## 2017-03-20 NOTE — Progress Notes (Signed)
Cardiology Office Note:    Date:  03/20/2017   ID:  [name], DOB April 21, 1971, MRN [id number]  PCP:  Neale Burly, MD  Cardiologist:  Mertie Moores, MD    Referring MD: Neale Burly, MD   Chief Complaint  Patient presents with  . Hypertension    History of Present Illness:    Karen [name] is a 46 y.o. female with a hx of HTN Im seeing Karen [name] today as a  Work in visit for HTN and headache Has had high blood pressure for the past 10 years.  She is tried lisinopril in the past and had an anaphylactic reaction.  She is tried amlodipine in the past but this caused her legs to swell.  She has been on Maxide for several years and seems to tolerate it fairly well.  She was in the clinic yesterday and noted that her blood pressure was very high.  It was associated with a very severe headache.  We gave her Bystolic 5 mg PO which seemed to ease her headache.  This morning her blood pressure is much better.  Does not get any regular exercise  Has some shortness of breath with exertion. Has some shortness of breath waling the dog   BP is well controlled most of the time . - 13 today may be again family history of high blood pressure   Past Medical History:  Diagnosis Date  . Hypertension     History reviewed. No pertinent surgical history.  Current Medications: Current Meds  Medication Sig  . cyanocobalamin 1000 MCG tablet Take 1,000 mcg daily by mouth.  . triamterene-hydrochlorothiazide (MAXZIDE-25) 37.5-25 MG tablet   . TURMERIC PO Take 1,500 mg by mouth.  . Vitamin D, Ergocalciferol, (DRISDOL) 50000 units CAPS capsule Take 50,000 Units by mouth every 7 (seven) days.     Allergies:   Lisinopril   Social History   Socioeconomic History  . Marital status: Single    Spouse name: None  . Number of children: None  . Years of education: None  . Highest education level: None  Social Needs  . Financial resource strain: None  . Food insecurity - worry: None  .  Food insecurity - inability: None  . Transportation needs - medical: None  . Transportation needs - non-medical: None  Occupational History  . None  Tobacco Use  . Smoking status: Never Smoker  . Smokeless tobacco: Never Used  Substance and Sexual Activity  . Alcohol use: No  . Drug use: No  . Sexual activity: None  Other Topics Concern  . None  Social History Narrative  . None     Family History: The patient's family history includes Hypertension in her brother, father, and sister. ROS:   Please see the history of present illness.     All other systems reviewed and are negative.  EKGs/Labs/Other Studies Reviewed:    The following studies were reviewed today:   EKG:  EKG is  ordered today.  The ekg ordered today demonstrates  NSR at 82.   NS ST changes.     Recent Labs: No results found for requested labs within last 8760 hours.  Recent Lipid Panel No results found for: CHOL, TRIG, HDL, CHOLHDL, VLDL, LDLCALC, LDLDIRECT  Physical Exam:    VS:  BP 138/80   Pulse 99   Ht 5\' 5"  (1.651 m)   Wt 140 lb (63.5 kg)   SpO2 92%   BMI 23.30 kg/m     Wt  Readings from Last 3 Encounters:  03/20/17 140 lb (63.5 kg)  08/13/15 149 lb (67.6 kg)     GEN:  Well nourished, well developed in no acute distress HEENT: Normal NECK: No JVD; No carotid bruits LYMPHATICS: No lymphadenopathy CARDIAC: RRR, no murmurs, rubs, gallops RESPIRATORY:  Clear to auscultation without rales, wheezing or rhonchi  ABDOMEN: Soft, non-tender, non-distended MUSCULOSKELETAL:  No edema; No deformity  SKIN: Warm and dry NEUROLOGIC:  Alert and oriented x 3 PSYCHIATRIC:  Normal affect   ASSESSMENT:    No diagnosis found. PLAN:    In order of problems listed above:  1. Essential hypertension: She will presents today following an episode of marked hypertension yesterday.  We started her on Bystolic 5 mg a day yesterday.  She is feeling quite a bit better.  The headache has resolved.  Her blood  pressure is much better today.  I would like to her to continue with the Bystolic 5 mg a day.  Continue with the current dose of triamterene/HCTZ.  I would like for her to exercise on a regular basis.  I have advised her to watch her salt intake.  She has some nonspecific ST changes on her EKG.  We will do a "quick look" echocardiogram today.  I will see her in 4 weeks.     Medication Adjustments/Labs and Tests Ordered: Current medicines are reviewed at length with the patient today.  Concerns regarding medicines are outlined above.  No orders of the defined types were placed in this encounter.  No orders of the defined types were placed in this encounter.   Signed, Mertie Moores, MD  03/20/2017 10:56 AM    [place]

## 2017-03-22 ENCOUNTER — Other Ambulatory Visit: Payer: Self-pay | Admitting: Nurse Practitioner

## 2017-03-22 DIAGNOSIS — I10 Essential (primary) hypertension: Secondary | ICD-10-CM | POA: Diagnosis not present

## 2017-03-22 DIAGNOSIS — Z13220 Encounter for screening for lipoid disorders: Secondary | ICD-10-CM

## 2017-03-22 LAB — BASIC METABOLIC PANEL
BUN/Creatinine Ratio: 25 — ABNORMAL HIGH (ref 9–23)
BUN: 14 mg/dL (ref 6–24)
CALCIUM: 11 mg/dL — AB (ref 8.7–10.2)
CO2: 26 mmol/L (ref 20–29)
CREATININE: 0.55 mg/dL — AB (ref 0.57–1.00)
Chloride: 96 mmol/L (ref 96–106)
GFR calc Af Amer: 128 mL/min/{1.73_m2} (ref >59)
GFR calc non Af Amer: 111 mL/min/{1.73_m2} (ref >59)
GLUCOSE: 103 mg/dL — AB (ref 65–99)
Potassium: 3.9 mmol/L (ref 3.5–5.2)
SODIUM: 139 mmol/L (ref 134–144)

## 2017-03-22 LAB — LIPID PANEL
CHOL/HDL RATIO: 4.3 ratio (ref 0.0–4.4)
Cholesterol, Total: 187 mg/dL (ref 100–199)
HDL: 43 mg/dL (ref >39)
LDL Calculated: 118 mg/dL — ABNORMAL HIGH (ref 0–99)
Triglycerides: 130 mg/dL (ref 0–149)
VLDL Cholesterol Cal: 26 mg/dL (ref 5–40)

## 2017-03-22 LAB — HEPATIC FUNCTION PANEL
ALT: 16 IU/L (ref 0–32)
AST: 33 IU/L (ref 0–40)
Albumin: 4.6 g/dL (ref 3.5–5.5)
Alkaline Phosphatase: 90 IU/L (ref 39–117)
BILIRUBIN, DIRECT: 0.09 mg/dL (ref 0.00–0.40)
Bilirubin Total: 0.4 mg/dL (ref 0.0–1.2)
TOTAL PROTEIN: 8.5 g/dL (ref 6.0–8.5)

## 2017-03-22 NOTE — Addendum Note (Signed)
Addended by: Eulis Foster on: 03/22/2017 09:48 AM   Modules accepted: Orders

## 2017-03-22 NOTE — Progress Notes (Signed)
.  li

## 2017-03-23 ENCOUNTER — Telehealth: Payer: Self-pay | Admitting: *Deleted

## 2017-03-23 DIAGNOSIS — I10 Essential (primary) hypertension: Secondary | ICD-10-CM | POA: Insufficient documentation

## 2017-03-23 HISTORY — DX: Essential (primary) hypertension: I10

## 2017-03-23 MED ORDER — SPIRONOLACTONE 25 MG PO TABS: 25.0000 mg | ORAL_TABLET | Freq: Every day | ORAL | 3 refills | Status: DC

## 2017-03-23 MED FILL — SPIRONOLACTONE 25 MG TABLET: 25 | 90 days supply | Qty: 90 | Fill #0

## 2017-03-23 NOTE — Telephone Encounter (Signed)
-----   Message from Thayer Headings, MD sent at 03/23/2017  2:21 PM EST ----- DC Maxzide Start Aldactone 25 mg a day  BMP in  2 weeks  She needs to greatly decreae her salt intake

## 2017-03-23 NOTE — Telephone Encounter (Signed)
Pt has been notified of lab results and medication changes. Bmet to be done in 2 weeks. Pt agreeable to plan of care. New Rx for Aldactone 25 mg daily sent to [facility] . Results sent to [place]

## 2017-04-02 ENCOUNTER — Other Ambulatory Visit: Payer: 59 | Admitting: *Deleted

## 2017-04-02 DIAGNOSIS — L219 Seborrheic dermatitis, unspecified: Secondary | ICD-10-CM | POA: Diagnosis not present

## 2017-04-02 DIAGNOSIS — I10 Essential (primary) hypertension: Secondary | ICD-10-CM

## 2017-04-02 DIAGNOSIS — Z23 Encounter for immunization: Secondary | ICD-10-CM | POA: Diagnosis not present

## 2017-04-02 DIAGNOSIS — L719 Rosacea, unspecified: Secondary | ICD-10-CM | POA: Diagnosis not present

## 2017-04-02 MED FILL — metroNIDAZOLE 0.75 % CREA: 0.75 | 30 days supply | Qty: 90 | Fill #0

## 2017-04-02 MED FILL — SOD SULFACET-SULFUR 10-5% C: 10-5 | 30 days supply | Qty: 170 | Fill #0

## 2017-04-02 MED FILL — KETOCONAZOLE 2% SHAMPOO: 2 | 90 days supply | Qty: 120 | Fill #0

## 2017-04-03 LAB — BASIC METABOLIC PANEL
BUN/Creatinine Ratio: 15 (ref 9–23)
BUN: 9 mg/dL (ref 6–24)
CALCIUM: 10.4 mg/dL — AB (ref 8.7–10.2)
CHLORIDE: 105 mmol/L (ref 96–106)
CO2: 22 mmol/L (ref 20–29)
CREATININE: 0.6 mg/dL (ref 0.57–1.00)
GFR calc Af Amer: 125 mL/min/{1.73_m2} (ref >59)
GFR calc non Af Amer: 108 mL/min/{1.73_m2} (ref >59)
GLUCOSE: 84 mg/dL (ref 65–99)
Potassium: 4.7 mmol/L (ref 3.5–5.2)
Sodium: 140 mmol/L (ref 134–144)

## 2017-04-20 MED FILL — VIT D2 1.25 MG (50,000 UNIT: 1.25 MG | 28 days supply | Qty: 4 | Fill #1

## 2017-04-26 ENCOUNTER — Ambulatory Visit (INDEPENDENT_AMBULATORY_CARE_PROVIDER_SITE_OTHER): Payer: 59 | Admitting: Cardiovascular Disease

## 2017-04-26 ENCOUNTER — Encounter: Payer: Self-pay | Admitting: Cardiovascular Disease

## 2017-04-26 VITALS — BP 126/90 | HR 100 | Ht 65.0 in | Wt 142.0 lb

## 2017-04-26 DIAGNOSIS — I10 Essential (primary) hypertension: Secondary | ICD-10-CM

## 2017-04-26 MED ORDER — NEBIVOLOL HCL 10 MG PO TABS: 10.0000 mg | ORAL_TABLET | Freq: Every day | ORAL | 3 refills | Status: DC

## 2017-04-26 NOTE — Patient Instructions (Addendum)
Medication Instructions:  Your physician has recommended you make the following change in your medication:  1-INCREASE Bystolic 10 mg by mouth daily  Labwork: NONE  Testing/Procedures: NONE  Follow-Up: Your physician wants you to follow-up in: 3 months with Dr. Acie Fredrickson.   If you need a refill on your cardiac medications before your next appointment, please call your pharmacy.

## 2017-04-26 NOTE — Progress Notes (Signed)
Cardiology Office Note:    Date:  04/26/2017   ID:  Karen [name], DOB 02/23/1971, MRN [id number]  PCP:  Neale Burly, MD  Cardiologist:  Mertie Moores, MD    Referring MD: Neale Burly, MD   No chief complaint on file.   History of Present Illness:    Karen [name] is a 46 y.o. female with a hx of HTN Im seeing Sharyn Lull today as a  Work in visit for HTN and headache Has had high blood pressure for the past 10 years.  She is tried lisinopril in the past and had an anaphylactic reaction.  She is tried amlodipine in the past but this caused her legs to swell.  She has been on Maxide for several years and seems to tolerate it fairly well.  She was in the clinic yesterday and noted that her blood pressure was very high.  It was associated with a very severe headache.  We gave her Bystolic 5 mg PO which seemed to ease her headache.  This morning her blood pressure is much better.  Does not get any regular exercise  Has some shortness of breath with exertion. Has some shortness of breath waling the dog   BP is well controlled most of the time . - 24 today may be again family history of high blood pressure  Dec. 20, 2018:  Karen [name] is seen today  BP has been ok Ankles are slightly swollen toward the end of the day  BP has ranged normal - slightly elevated   Past Medical History:  Diagnosis Date  . Hypertension     History reviewed. No pertinent surgical history.  Current Medications: No outpatient medications have been marked as taking for the 04/26/17 encounter (Office Visit) with Nahser, Wonda Cheng, MD.     Allergies:   Lisinopril   Social History   Socioeconomic History  . Marital status: Single    Spouse name: None  . Number of children: None  . Years of education: None  . Highest education level: None  Social Needs  . Financial resource strain: None  . Food insecurity - worry: None  . Food insecurity - inability: None  . Transportation needs -  medical: None  . Transportation needs - non-medical: None  Occupational History  . None  Tobacco Use  . Smoking status: Never Smoker  . Smokeless tobacco: Never Used  Substance and Sexual Activity  . Alcohol use: No  . Drug use: No  . Sexual activity: None  Other Topics Concern  . None  Social History Narrative  . None     Family History: The patient's family history includes Hypertension in her brother, father, and sister. ROS:   Please see the history of present illness.     All other systems reviewed and are negative.  EKGs/Labs/Other Studies Reviewed:    The following studies were reviewed today:   EKG:  EKG is  ordered today.  The ekg ordered today demonstrates  NSR at 82.   NS ST changes.     Recent Labs: 03/22/2017: ALT 16 04/02/2017: BUN 9; Creatinine, Ser 0.60; Potassium 4.7; Sodium 140  Recent Lipid Panel    Component Value Date/Time   CHOL 187 03/22/2017 0948   TRIG 130 03/22/2017 0948   HDL 43 03/22/2017 0948   CHOLHDL 4.3 03/22/2017 0948   LDLCALC 118 (H) 03/22/2017 0948    Physical Exam:    VS:  BP 126/90   Pulse 100   Ht 5'  5" (1.651 m)   Wt 142 lb (64.4 kg)   BMI 23.63 kg/m     Wt Readings from Last 3 Encounters:  04/26/17 142 lb (64.4 kg)  03/20/17 140 lb (63.5 kg)  08/13/15 149 lb (67.6 kg)     GEN:  Well nourished, young , well developed in no acute distress HEENT: Normal NECK: No JVD; No carotid bruits LYMPHATICS: No lymphadenopathy CARDIAC: RRR, no murmurs, rubs, gallops RESPIRATORY:  Clear to auscultation without rales, wheezing or rhonchi  ABDOMEN: Soft, non-tender, non-distended MUSCULOSKELETAL:  No edema; No deformity  SKIN: Warm and dry NEUROLOGIC:  Alert and oriented x 3 PSYCHIATRIC:  Normal affect   ASSESSMENT:    1. Essential hypertension    PLAN:    In order of problems listed above:  1. Essential hypertension:   BP is better.   HR is still elevated.   We will increase her by Bystolic to 10 mg a day  . Continue spironolactone  25  mg a day  Will see her in several months    Medication Adjustments/Labs and Tests Ordered: Current medicines are reviewed at length with the patient today.  Concerns regarding medicines are outlined above.  No orders of the defined types were placed in this encounter.  Meds ordered this encounter  Medications  . nebivolol (BYSTOLIC) 10 MG tablet    Sig: Take 1 tablet (10 mg total) by mouth daily.    Dispense:  90 tablet    Refill:  3    Signed, Mertie Moores, MD  04/26/2017 2:31 PM    [place]

## 2017-05-17 DIAGNOSIS — Z113 Encounter for screening for infections with a predominantly sexual mode of transmission: Secondary | ICD-10-CM | POA: Diagnosis not present

## 2017-05-17 DIAGNOSIS — Z130 Encounter for screening for diseases of the blood and blood-forming organs and certain disorders involving the immune mechanism: Secondary | ICD-10-CM | POA: Diagnosis not present

## 2017-05-17 DIAGNOSIS — R61 Generalized hyperhidrosis: Secondary | ICD-10-CM | POA: Diagnosis not present

## 2017-05-17 DIAGNOSIS — Z1389 Encounter for screening for other disorder: Secondary | ICD-10-CM | POA: Diagnosis not present

## 2017-05-17 DIAGNOSIS — Z01419 Encounter for gynecological examination (general) (routine) without abnormal findings: Secondary | ICD-10-CM | POA: Diagnosis not present

## 2017-05-17 DIAGNOSIS — E079 Disorder of thyroid, unspecified: Secondary | ICD-10-CM | POA: Diagnosis not present

## 2017-05-17 DIAGNOSIS — Z1151 Encounter for screening for human papillomavirus (HPV): Secondary | ICD-10-CM | POA: Diagnosis not present

## 2017-05-17 DIAGNOSIS — Z124 Encounter for screening for malignant neoplasm of cervix: Secondary | ICD-10-CM | POA: Diagnosis not present

## 2017-05-17 DIAGNOSIS — Z1231 Encounter for screening mammogram for malignant neoplasm of breast: Secondary | ICD-10-CM | POA: Diagnosis not present

## 2017-05-17 DIAGNOSIS — D259 Leiomyoma of uterus, unspecified: Secondary | ICD-10-CM | POA: Diagnosis not present

## 2017-05-18 DIAGNOSIS — Z124 Encounter for screening for malignant neoplasm of cervix: Secondary | ICD-10-CM | POA: Diagnosis not present

## 2017-05-18 DIAGNOSIS — E079 Disorder of thyroid, unspecified: Secondary | ICD-10-CM | POA: Diagnosis not present

## 2017-05-18 LAB — HM PAP SMEAR: HM Pap smear: NORMAL

## 2017-05-23 ENCOUNTER — Other Ambulatory Visit: Payer: Self-pay | Admitting: Nurse Practitioner

## 2017-05-23 DIAGNOSIS — E079 Disorder of thyroid, unspecified: Secondary | ICD-10-CM

## 2017-05-24 ENCOUNTER — Other Ambulatory Visit: Payer: 59 | Admitting: *Deleted

## 2017-05-24 LAB — THYROID PANEL WITH TSH
FREE THYROXINE INDEX: 2 (ref 1.2–4.9)
T3 UPTAKE RATIO: 25 % (ref 24–39)
T4 TOTAL: 8 ug/dL (ref 4.5–12.0)
TSH: 1.43 u[IU]/mL (ref 0.450–4.500)

## 2017-06-01 MED FILL — BYSTOLIC 10 MG TABLET: 10 | 90 days supply | Qty: 90 | Fill #0

## 2017-06-28 ENCOUNTER — Ambulatory Visit: Payer: 59 | Admitting: Podiatry

## 2017-06-28 ENCOUNTER — Ambulatory Visit (INDEPENDENT_AMBULATORY_CARE_PROVIDER_SITE_OTHER): Payer: 59

## 2017-06-28 ENCOUNTER — Encounter: Payer: Self-pay | Admitting: Podiatry

## 2017-06-28 DIAGNOSIS — R609 Edema, unspecified: Secondary | ICD-10-CM

## 2017-06-28 DIAGNOSIS — M7750 Other enthesopathy of unspecified foot: Secondary | ICD-10-CM

## 2017-06-28 DIAGNOSIS — M779 Enthesopathy, unspecified: Secondary | ICD-10-CM

## 2017-06-28 MED ORDER — MELOXICAM 15 MG PO TABS: 15.0000 mg | ORAL_TABLET | Freq: Every day | ORAL | 0 refills | Status: DC

## 2017-06-28 MED FILL — MELOXICAM 15 MG TABLET: 15 | 30 days supply | Qty: 30 | Fill #0

## 2017-06-28 NOTE — Progress Notes (Signed)
Subjective:    Patient ID: Karen [name], female    DOB: 12-10-1970, 47 y.o.   MRN: [id number]  HPI  47 year old female presents the office today for concern of pain to the outside aspect of the right ankle she has some swelling on the ankle.  She does have a history of fracture in 2005 she had no issues In the last 2 months when she started to get discomfort which is worse with walking and stops at times.  She has had no recent injury or trauma and she denies any redness or warmth.  She said no recent treatment for this.  She has no other concerns today.  Review of Systems  All other systems reviewed and are negative.  Past Medical History:  Diagnosis Date  . Hypertension     History reviewed. No pertinent surgical history.   Current Outpatient Medications:  .  cyanocobalamin 1000 MCG tablet, Take 1,000 mcg daily by mouth., Disp: , Rfl:  .  meloxicam (MOBIC) 15 MG tablet, Take 1 tablet (15 mg total) by mouth daily., Disp: 30 tablet, Rfl: 0 .  nebivolol (BYSTOLIC) 10 MG tablet, Take 1 tablet (10 mg total) by mouth daily., Disp: 90 tablet, Rfl: 3 .  spironolactone (ALDACTONE) 25 MG tablet, Take 1 tablet (25 mg total) daily by mouth., Disp: 90 tablet, Rfl: 3 .  TURMERIC PO, Take 1,500 mg by mouth., Disp: , Rfl:  .  Vitamin D, Ergocalciferol, (DRISDOL) 50000 units CAPS capsule, Take 50,000 Units by mouth every 7 (seven) days., Disp: , Rfl:   Allergies  Allergen Reactions  . Lisinopril Hives and Swelling    Social History   Socioeconomic History  . Marital status: Single    Spouse name: Not on file  . Number of children: Not on file  . Years of education: Not on file  . Highest education level: Not on file  Social Needs  . Financial resource strain: Not on file  . Food insecurity - worry: Not on file  . Food insecurity - inability: Not on file  . Transportation needs - medical: Not on file  . Transportation needs - non-medical: Not on file  Occupational History  . Not on  file  Tobacco Use  . Smoking status: Never Smoker  . Smokeless tobacco: Never Used  Substance and Sexual Activity  . Alcohol use: No  . Drug use: No  . Sexual activity: Not on file  Other Topics Concern  . Not on file  Social History Narrative  . Not on file        Objective:   Physical Exam  General: AAO x3, NAD  Dermatological: Skin is warm, dry and supple bilateral. Nails x 10 are well manicured; remaining integument appears unremarkable at this time. There are no open sores, no preulcerative lesions, no rash or signs of infection present.  Vascular: Dorsalis Pedis artery and Posterior Tibial artery pedal pulses are 2/4 bilateral with immedate capillary fill time. Pedal hair growth present. No varicosities and no lower extremity edema present bilateral. There is no pain with calf compression, swelling, warmth, erythema.   Neruologic: Grossly intact via light touch bilateral. Protective threshold with Semmes Wienstein monofilament intact to all pedal sites bilateral.   Musculoskeletal: There is tenderness palpation to the lateral aspect of the right foot on the sinus tarsi.  There is localized edema to this area but there is no erythema or increase in warmth.  Deltoid ligament, lateral ankle ligaments appear to be intact there is  no gross ankle instability present.  No pain to syndesmosis.  No pain to the foot.  No significant crepitation or restriction of range of motion of the ankle or subtalar joint.  Achilles tendon appears to be intact.  Muscular strength 5/5 in all groups tested bilateral.  Gait: Unassisted, Nonantalgic.     Assessment & Plan:  47 year old female capsulitis right subtalar joint -Treatment options discussed including all alternatives, risks, and complications -Etiology of symptoms were discussed -X-rays were obtained and reviewed with the patient.  No definitive evidence of acute fracture identified. -Steroid injection was performed.  See procedure note  below. -Tri-Lock ankle brace -We discussed the change in shoes as well as inserts. -Prescribed mobic. Discussed side effects of the medication and directed to stop if any are to occur and call the office.  -Follow-up as scheduled or sooner if needed.  She has no further questions or concerns today. If symptoms continue, MRI  Procedure: Injection intermediate joint Discussed alternatives, risks, complications and verbal consent was obtained.  Location: Right sinus tarsi, lateral approach Skin Prep: Betadine. Injectate: 0.5 cc 0.5% marcaine plain, 0.5 cc 0.5% Marcaine plain and, 1 cc kenalog 10. Disposition: Patient tolerated procedure well. Injection site dressed with a band-aid.  Post-injection care was discussed and return precautions discussed.    Trula Slade DPM

## 2017-07-12 DIAGNOSIS — M349 Systemic sclerosis, unspecified: Secondary | ICD-10-CM | POA: Diagnosis not present

## 2017-07-12 DIAGNOSIS — R0602 Shortness of breath: Secondary | ICD-10-CM | POA: Diagnosis not present

## 2017-07-12 DIAGNOSIS — I7300 Raynaud's syndrome without gangrene: Secondary | ICD-10-CM | POA: Diagnosis not present

## 2017-07-12 DIAGNOSIS — Z6823 Body mass index (BMI) 23.0-23.9, adult: Secondary | ICD-10-CM | POA: Diagnosis not present

## 2017-07-12 DIAGNOSIS — M79605 Pain in left leg: Secondary | ICD-10-CM | POA: Diagnosis not present

## 2017-07-12 DIAGNOSIS — R21 Rash and other nonspecific skin eruption: Secondary | ICD-10-CM | POA: Diagnosis not present

## 2017-07-12 DIAGNOSIS — M545 Low back pain: Secondary | ICD-10-CM | POA: Diagnosis not present

## 2017-07-12 DIAGNOSIS — M79604 Pain in right leg: Secondary | ICD-10-CM | POA: Diagnosis not present

## 2017-07-16 MED FILL — VIT D2 1.25 MG (50,000 UNIT: 1.25 MG | 28 days supply | Qty: 4 | Fill #2

## 2017-07-18 MED FILL — SPIRONOLACTONE 25 MG TABLET: 25 | 90 days supply | Qty: 90 | Fill #1

## 2017-07-19 ENCOUNTER — Ambulatory Visit: Payer: 59 | Admitting: Podiatry

## 2017-07-19 DIAGNOSIS — M779 Enthesopathy, unspecified: Secondary | ICD-10-CM

## 2017-07-20 ENCOUNTER — Encounter: Payer: Self-pay | Admitting: Cardiovascular Disease

## 2017-07-20 ENCOUNTER — Ambulatory Visit (INDEPENDENT_AMBULATORY_CARE_PROVIDER_SITE_OTHER): Payer: 59 | Admitting: Cardiovascular Disease

## 2017-07-20 VITALS — BP 180/130 | HR 75 | Ht 65.0 in | Wt 139.0 lb

## 2017-07-20 DIAGNOSIS — I10 Essential (primary) hypertension: Secondary | ICD-10-CM | POA: Diagnosis not present

## 2017-07-20 MED ORDER — AMLODIPINE BESYLATE 5 MG PO TABS: 5.0000 mg | ORAL_TABLET | Freq: Every day | ORAL | 3 refills | Status: DC

## 2017-07-20 MED ORDER — HYDROCHLOROTHIAZIDE 25 MG PO TABS: 25.0000 mg | ORAL_TABLET | Freq: Every day | ORAL | 3 refills | Status: DC

## 2017-07-20 MED FILL — HYDROCHLOROTHIAZIDE 25 MG T: 25 | 90 days supply | Qty: 90 | Fill #0

## 2017-07-20 MED FILL — AMLODIPINE BESYLATE 5 MG TA: 5 | 90 days supply | Qty: 90 | Fill #0

## 2017-07-20 NOTE — Progress Notes (Signed)
Cardiology Office Note:    Date:  07/20/2017   ID:  Karen [name], DOB [birth year], MRN [id number]  PCP:  Neale Burly, MD  Cardiologist:  Mertie Moores, MD    Referring MD: Neale Burly, MD   Problem List 1. HTN 2. Scleroderma   Chief Complaint  Patient presents with  . Hypertension      03/20/18  Karen [name] is a 47 y.o. female with a hx of HTN Im seeing Sharyn Lull today as a  Work in visit for HTN and headache Has had high blood pressure for the past 10 years.  She is tried lisinopril in the past and had an anaphylactic reaction.  She is tried amlodipine in the past but this caused her legs to swell.  She has been on Maxide for several years and seems to tolerate it fairly well.  She was in the clinic yesterday and noted that her blood pressure was very high.  It was associated with a very severe headache.  We gave her Bystolic 5 mg PO which seemed to ease her headache.  This morning her blood pressure is much better.  Does not get any regular exercise  Has some shortness of breath with exertion. Has some shortness of breath waling the dog   BP is well controlled most of the time . - 80 today may be again family history of high blood pressure  We had some quick echo done ( no report generated)   Dec. 20, 2018:  Karen [name] is seen today  BP has been ok Ankles are slightly swollen toward the end of the day  BP has ranged normal - slightly elevated  July 20, 2017  BP has been elevated  Says she has not been adding any salt. Feels ok    Past Medical History:  Diagnosis Date  . Hypertension     History reviewed. No pertinent surgical history.  Current Medications: Current Meds  Medication Sig  . cyanocobalamin 1000 MCG tablet Take 1,000 mcg daily by mouth.  . meloxicam (MOBIC) 15 MG tablet Take 1 tablet (15 mg total) by mouth daily.  . nebivolol (BYSTOLIC) 10 MG tablet Take 1 tablet (10 mg total) by mouth daily.  Marland Kitchen spironolactone (ALDACTONE) 25 MG  tablet Take 1 tablet (25 mg total) daily by mouth.  . TURMERIC PO Take 1,500 mg by mouth daily.   . Vitamin D, Ergocalciferol, (DRISDOL) 50000 units CAPS capsule Take 50,000 Units by mouth every 7 (seven) days.     Allergies:   Lisinopril   Social History   Socioeconomic History  . Marital status: Single    Spouse name: None  . Number of children: None  . Years of education: None  . Highest education level: None  Social Needs  . Financial resource strain: None  . Food insecurity - worry: None  . Food insecurity - inability: None  . Transportation needs - medical: None  . Transportation needs - non-medical: None  Occupational History  . None  Tobacco Use  . Smoking status: Never Smoker  . Smokeless tobacco: Never Used  Substance and Sexual Activity  . Alcohol use: No  . Drug use: No  . Sexual activity: None  Other Topics Concern  . None  Social History Narrative  . None     Family History: The patient's family history includes Hypertension in her brother, father, and sister. ROS:   Please see the history of present illness.     All other systems reviewed and  are negative.  EKGs/Labs/Other Studies Reviewed:    The following studies were reviewed today:   EKG:  EKG is  ordered today.  The ekg ordered today demonstrates  NSR at 82.   NS ST changes.     Recent Labs: 03/22/2017: ALT 16 04/02/2017: BUN 9; Creatinine, Ser 0.60; Potassium 4.7; Sodium 140 05/23/2017: TSH 1.430  Recent Lipid Panel    Component Value Date/Time   CHOL 187 03/22/2017 0948   TRIG 130 03/22/2017 0948   HDL 43 03/22/2017 0948   CHOLHDL 4.3 03/22/2017 0948   LDLCALC 118 (H) 03/22/2017 0948    Physical Exam:    VS:  BP (!) 180/130   Pulse 75   Ht 5\' 5"  (1.651 m)   Wt 139 lb (63 kg)   SpO2 99%   BMI 23.13 kg/m     Wt Readings from Last 3 Encounters:  07/20/17 139 lb (63 kg)  04/26/17 142 lb (64.4 kg)  03/20/17 140 lb (63.5 kg)     GEN:  Well nourished, young , well developed  in no acute distress HEENT: Normal NECK: No JVD; No carotid bruits LYMPHATICS: No lymphadenopathy CARDIAC: RRR, no murmurs, rubs, gallops RESPIRATORY:  Clear to auscultation without rales, wheezing or rhonchi  ABDOMEN: Soft, non-tender, non-distended MUSCULOSKELETAL:  No edema; No deformity  SKIN: Warm and dry NEUROLOGIC:  Alert and oriented x 3 PSYCHIATRIC:  Normal affect   ASSESSMENT:    1. Essential hypertension    PLAN:    In order of problems listed above:  Essential hypertension:    Blood pressure is very elevated today.  She insists that she is taking her medication and is avoiding salt.  Continue Bystolic and Aldactone.  We will add amlodipine 5 mg a day and HCTZ 25 mg a day.  I will see her again in as a work in visit next week.  She may ultimately need Lasix. Check a basic metabolic profile in 2 weeks.  Her diet was reviewed by my nurse.   She still eats some excessive salt - eats takeout food almost every day .  Will see if her BP improves. Her blood pressure was very well controlled several months ago with just Aldactone and Bystolic alone.  We will see if we can get her back to that.   Medication Adjustments/Labs and Tests Ordered: Current medicines are reviewed at length with the patient today.  Concerns regarding medicines are outlined above.  Orders Placed This Encounter  Procedures  . Basic Metabolic Panel (BMET)   Meds ordered this encounter  Medications  . hydrochlorothiazide (HYDRODIURIL) 25 MG tablet    Sig: Take 1 tablet (25 mg total) by mouth daily.    Dispense:  90 tablet    Refill:  3  . amLODipine (NORVASC) 5 MG tablet    Sig: Take 1 tablet (5 mg total) by mouth daily.    Dispense:  180 tablet    Refill:  3    Signed, Mertie Moores, MD  07/20/2017 5:49 PM    [place]

## 2017-07-20 NOTE — Patient Instructions (Addendum)
Medication Instructions:  Your physician has recommended you make the following change in your medication: START Amlodipine 5 mg once daily START HCTZ 25 mg once daily   Labwork: Your physician recommends that you return for lab work in: 3 weeks for basic metabolic panel   Testing/Procedures: None Ordered   Follow-Up: Your physician recommends that you return for follow-up appointment in: 1 week on Wednesday 3/20 at 4:20 pm   If you need a refill on your cardiac medications before your next appointment, please call your pharmacy.   Thank you for choosing [facility]! Christen Bame, RN [phone]

## 2017-07-23 DIAGNOSIS — G441 Vascular headache, not elsewhere classified: Secondary | ICD-10-CM | POA: Diagnosis not present

## 2017-07-23 DIAGNOSIS — R51 Headache: Secondary | ICD-10-CM | POA: Diagnosis not present

## 2017-07-23 DIAGNOSIS — R918 Other nonspecific abnormal finding of lung field: Secondary | ICD-10-CM | POA: Diagnosis not present

## 2017-07-23 DIAGNOSIS — T50905A Adverse effect of unspecified drugs, medicaments and biological substances, initial encounter: Secondary | ICD-10-CM | POA: Diagnosis not present

## 2017-07-23 DIAGNOSIS — H6123 Impacted cerumen, bilateral: Secondary | ICD-10-CM | POA: Diagnosis not present

## 2017-07-23 DIAGNOSIS — I161 Hypertensive emergency: Secondary | ICD-10-CM | POA: Diagnosis not present

## 2017-07-23 DIAGNOSIS — I160 Hypertensive urgency: Secondary | ICD-10-CM | POA: Diagnosis not present

## 2017-07-23 DIAGNOSIS — R911 Solitary pulmonary nodule: Secondary | ICD-10-CM | POA: Diagnosis not present

## 2017-07-23 DIAGNOSIS — N390 Urinary tract infection, site not specified: Secondary | ICD-10-CM | POA: Diagnosis not present

## 2017-07-23 DIAGNOSIS — R0602 Shortness of breath: Secondary | ICD-10-CM | POA: Diagnosis not present

## 2017-07-23 DIAGNOSIS — I10 Essential (primary) hypertension: Secondary | ICD-10-CM | POA: Diagnosis not present

## 2017-07-23 DIAGNOSIS — M358 Other specified systemic involvement of connective tissue: Secondary | ICD-10-CM | POA: Diagnosis not present

## 2017-07-23 DIAGNOSIS — E876 Hypokalemia: Secondary | ICD-10-CM | POA: Diagnosis not present

## 2017-07-23 NOTE — Progress Notes (Signed)
Subjective: Karen [name] right subtalar joint capsulitis.  She states the injection helped quite a bit.  She states that she still gets some minimal discomfort to the area but overall she is doing significantly better than she was.  She still has some occasional swelling to the outside aspect of her ankle when she is on it but overall this is improving as well.  She denies any recent injury or changes since the last saw her.  She has no new concerns. Presents the office today for follow-up evaluation of denies any systemic complaints such as fevers, chills, nausea, vomiting. No acute changes since last appointment, and no other complaints at this time.   Objective: AAO x3, NAD DP/PT pulses palpable bilaterally, CRT less than 3 seconds There is minimal discomfort along the lateral aspect the foot on sinus tarsi.  There is minimal edema to this area there is no erythema or increase in warmth.  There is no pain or crepitation subtalar ankle joint range of motion.  There is no area pinpoint bony tenderness or pain to vibratory sensation bilaterally.  There is no other area tenderness. No open lesions or pre-ulcerative lesions.  No pain with calf compression, swelling, warmth, erythema  Assessment: Resolving capsulitis right subtalar joint  Plan: -All treatment options discussed with the patient including all alternatives, risks, complications.  -At this point she is doing well in order to hold off another injection at this time.  She can use meloxicam as needed for anti-inflammatory.  Long-term I think she will benefit from a custom molded orthotic.  Check insurance coverage for her to have her come back in to see [facility] for molding of orthotics.  We also discussed changing shoes. -Other symptoms are improving we will hold off on MRI. -Patient encouraged to call the office with any questions, concerns, change in symptoms.  She agrees with plan has no further questions   Trula Slade DPM

## 2017-07-24 DIAGNOSIS — I10 Essential (primary) hypertension: Secondary | ICD-10-CM | POA: Diagnosis not present

## 2017-07-24 DIAGNOSIS — G441 Vascular headache, not elsewhere classified: Secondary | ICD-10-CM | POA: Diagnosis not present

## 2017-07-24 DIAGNOSIS — E559 Vitamin D deficiency, unspecified: Secondary | ICD-10-CM | POA: Diagnosis not present

## 2017-07-24 DIAGNOSIS — R918 Other nonspecific abnormal finding of lung field: Secondary | ICD-10-CM | POA: Diagnosis not present

## 2017-07-24 DIAGNOSIS — T50905A Adverse effect of unspecified drugs, medicaments and biological substances, initial encounter: Secondary | ICD-10-CM | POA: Diagnosis not present

## 2017-07-24 DIAGNOSIS — M3500 Sicca syndrome, unspecified: Secondary | ICD-10-CM | POA: Diagnosis not present

## 2017-07-24 DIAGNOSIS — M358 Other specified systemic involvement of connective tissue: Secondary | ICD-10-CM | POA: Diagnosis not present

## 2017-07-24 DIAGNOSIS — E876 Hypokalemia: Secondary | ICD-10-CM | POA: Diagnosis not present

## 2017-07-24 DIAGNOSIS — Z888 Allergy status to other drugs, medicaments and biological substances status: Secondary | ICD-10-CM | POA: Diagnosis not present

## 2017-07-24 DIAGNOSIS — N390 Urinary tract infection, site not specified: Secondary | ICD-10-CM | POA: Diagnosis not present

## 2017-07-24 DIAGNOSIS — E042 Nontoxic multinodular goiter: Secondary | ICD-10-CM | POA: Diagnosis not present

## 2017-07-24 DIAGNOSIS — I161 Hypertensive emergency: Secondary | ICD-10-CM | POA: Diagnosis not present

## 2017-07-24 DIAGNOSIS — H6123 Impacted cerumen, bilateral: Secondary | ICD-10-CM | POA: Diagnosis not present

## 2017-07-24 DIAGNOSIS — I160 Hypertensive urgency: Secondary | ICD-10-CM | POA: Diagnosis not present

## 2017-07-24 DIAGNOSIS — E041 Nontoxic single thyroid nodule: Secondary | ICD-10-CM | POA: Diagnosis not present

## 2017-07-24 DIAGNOSIS — Z79899 Other long term (current) drug therapy: Secondary | ICD-10-CM | POA: Diagnosis not present

## 2017-07-24 DIAGNOSIS — R911 Solitary pulmonary nodule: Secondary | ICD-10-CM | POA: Diagnosis not present

## 2017-07-25 ENCOUNTER — Encounter: Payer: Self-pay | Admitting: "Endocrinology

## 2017-07-25 ENCOUNTER — Ambulatory Visit: Payer: 59 | Admitting: Cardiovascular Disease

## 2017-07-26 ENCOUNTER — Other Ambulatory Visit: Payer: Self-pay | Admitting: Rheumatology

## 2017-07-26 ENCOUNTER — Telehealth: Payer: Self-pay | Admitting: Nurse Practitioner

## 2017-07-26 DIAGNOSIS — R0600 Dyspnea, unspecified: Secondary | ICD-10-CM

## 2017-07-26 DIAGNOSIS — J984 Other disorders of lung: Secondary | ICD-10-CM

## 2017-07-26 NOTE — Telephone Encounter (Signed)
Later today BP is 141/100 mmHg, pulse 79 right arm  137/105 mmHg, pulse 80 left arm Advised patient we will continue current medication regimen until Dr. Acie Fredrickson returns to the office next week since medications were just changed this week.

## 2017-07-26 NOTE — Telephone Encounter (Signed)
Updated patient's medication list since she was recently hospitalized at [facility] in [place]. Her BP today is 158/92 mmHg in both arms. Her pulse is 80 bpm. I will forward results to Dr. Acie Fredrickson for follow-up.

## 2017-07-27 ENCOUNTER — Ambulatory Visit: Payer: 59 | Admitting: Internal Medicine

## 2017-07-27 DIAGNOSIS — R0600 Dyspnea, unspecified: Secondary | ICD-10-CM

## 2017-07-27 LAB — PULMONARY FUNCTION TEST
DL/VA % PRED: 131 %
DL/VA: 6.48 ml/min/mmHg/L
DLCO UNC: 11.34 ml/min/mmHg
DLCO unc % pred: 44 %
FEF 25-75 POST: 2.89 L/s
FEF 25-75 Pre: 2.56 L/sec
FEF2575-%Change-Post: 12 %
FEF2575-%PRED-POST: 113 %
FEF2575-%PRED-PRE: 100 %
FEV1-%CHANGE-POST: 1 %
FEV1-%Pred-Post: 62 %
FEV1-%Pred-Pre: 62 %
FEV1-PRE: 1.51 L
FEV1-Post: 1.53 L
FEV1FVC-%Change-Post: 4 %
FEV1FVC-%PRED-PRE: 108 %
FEV6-%Change-Post: -2 %
FEV6-%Pred-Post: 55 %
FEV6-%Pred-Pre: 57 %
FEV6-POST: 1.65 L
FEV6-Pre: 1.69 L
FEV6FVC-%Change-Post: 0 %
FEV6FVC-%PRED-POST: 103 %
FEV6FVC-%Pred-Pre: 102 %
FVC-%Change-Post: -3 %
FVC-%PRED-POST: 54 %
FVC-%PRED-PRE: 56 %
FVC-POST: 1.65 L
FVC-PRE: 1.7 L
PRE FEV1/FVC RATIO: 89 %
Post FEV1/FVC ratio: 93 %
Post FEV6/FVC ratio: 100 %
Pre FEV6/FVC Ratio: 100 %
RV % pred: 62 %
RV: 1.13 L
TLC % PRED: 50 %
TLC: 2.65 L

## 2017-07-27 NOTE — Progress Notes (Signed)
PFT done today. 

## 2017-07-30 ENCOUNTER — Other Ambulatory Visit: Payer: Self-pay | Admitting: *Deleted

## 2017-07-30 NOTE — Telephone Encounter (Signed)
BP is still elevated but is gradually improving . Continue meds Will continue to titrate up meds as needed, / as tolerated.

## 2017-07-30 NOTE — Patient Outreach (Signed)
[facility]) Care Management  07/30/2017  Karen [name] 09-22-1970 [id number]  Subjective: Telephone call to patient's home  / mobile number, no answer, left HIPAA compliant voicemail message, and requested call back.     Objective: Per KPN (Knowledge Performance Now, point of care tool) and chart review, patient hospitalized 07/24/17 for hypertension at [facility], and discharge date unknown.  Patient also has a history of scleroderma.    Assessment: Received [facility]MR Transition of care referral on 07/30/17.   Transition of care follow up pending patient contact.     Plan: RNCM will call patient for 2nd telephone outreach attempt, transition of care follow up, within 10 business days if no return call.    Cydnee Fuquay H. Annia Friendly, BSN, [place] Management [facility] Telephonic CM Phone: [phone] Fax: [phone]

## 2017-07-31 ENCOUNTER — Ambulatory Visit: Payer: Self-pay | Admitting: *Deleted

## 2017-07-31 ENCOUNTER — Other Ambulatory Visit: Payer: Self-pay | Admitting: *Deleted

## 2017-07-31 NOTE — Patient Outreach (Signed)
[facility]) Care Management  07/31/2017  Karen [name] Aug 24, 1970 [id number]   Subjective: Telephone call to patient's home  / mobile number, no answer, left HIPAA compliant voicemail message, and requested call back.     Objective: Per KPN (Knowledge Performance Now, point of care tool) and chart review, patient hospitalized 07/24/17 for hypertension at [facility], and discharge date unknown.  Patient also has a history of scleroderma.    Assessment: Received UMR Transition of care referral on 07/30/17.   Transition of care follow up pending patient contact.     Plan: RNCM will send unsuccessful outreach  letter, [facility] pamphlet, will call patient for 3rd telephone outreach attempt, transition of care follow up, and proceed with case closure, within 10 business days if no return call.    Abella Shugart H. Annia Friendly, BSN, [place] Management [facility] Telephonic CM Phone: [phone] Fax: [phone]

## 2017-08-01 ENCOUNTER — Other Ambulatory Visit: Payer: Self-pay | Admitting: *Deleted

## 2017-08-01 NOTE — Patient Outreach (Signed)
[facility]) Care Management  08/01/2017  Karen [name] 09-28-1970 [id number]  Subjective: Received voicemail message from Ms. Karen [name], states she is returning call, and requested call back. Telephone call to patient's home  / mobile number, no answer, left HIPAA compliant voicemail message, and requested call back.   Objective:Per KPN (Knowledge Performance Now, point of care tool) and chart review,patient hospitalized 07/24/17 for hypertension at [facility], and discharge date unknown. Patient also has a history of scleroderma.    Assessment: Received UMR Transition of care referral on 07/30/17.Transition of care follow up pending patient contact.    Plan:RNCM has sent unsuccessful outreach  letter, [facility] pamphlet, and will proceed with case closure, within 10 business days if no return call.     Karen [name] H. Annia Friendly, BSN, [place] Management [facility] Telephonic CM Phone: [phone] Fax: [phone]

## 2017-08-02 ENCOUNTER — Other Ambulatory Visit: Payer: 59 | Admitting: Orthotics

## 2017-08-02 ENCOUNTER — Encounter: Payer: Self-pay | Admitting: "Endocrinology

## 2017-08-02 ENCOUNTER — Other Ambulatory Visit: Payer: Self-pay | Admitting: *Deleted

## 2017-08-02 DIAGNOSIS — I10 Essential (primary) hypertension: Secondary | ICD-10-CM | POA: Diagnosis not present

## 2017-08-02 DIAGNOSIS — E041 Nontoxic single thyroid nodule: Secondary | ICD-10-CM | POA: Diagnosis not present

## 2017-08-02 DIAGNOSIS — Z6821 Body mass index (BMI) 21.0-21.9, adult: Secondary | ICD-10-CM | POA: Diagnosis not present

## 2017-08-02 NOTE — Patient Outreach (Addendum)
[facility]) Care Management  08/02/2017  Mallarie Voorhies 1971-02-10 [id number]   Subjective: Received voicemail message from Ms. [name], states she is returning call, and requested call back. Telephone call to patient's home / mobile number, spoke with patient, and HIPAA verified.  Discussed [facility] Care Management UMR Transition of care follow up, patient voiced understanding, and is in agreement to follow up.    Patient states she is doing well, was hospitalized for hypertension 07/23/17 -07/25/17, had follow up appointment with primary MD today, Aldactone increased from 25 mg to 50 mg qd, and start increase dose on 08/03/17.  States she has a blood pressure monitor at home, she is able to manage self care and has assistance as needed with activities of daily living / home management.    Patient advised to contact [facility] to inquire if discount card available for Bystolic medication, patient voiced understanding, and states she will follow up.    Patient voices understanding of medical diagnosis and treatment plan.   States she is accessing the following [facility] benefits: outpatient pharmacy, hospital indemnity (RNCM will email contact information for [employer], will file claim if appropriate, will email contact information for itemized bill request), and family medical leave act (FMLA) in progress.    Patient states she does not have any  transition of care, care coordination, transportation, community resource, or pharmacy needs at this time.  RNCM will send patient education material related to hypertension per her request.  Patient in agreement to a referral to [employer] [facility] for hypertension disease management and RNCM will also send [employer] web site in successful outreach letter. States she is very appreciative of the follow up and is in agreement to receive [place] Management information.      Objective:Per KPN (Knowledge Performance Now, point of  care tool) and chart review,patient hospitalized 07/24/17 for hypertension at [facility], and discharge date unknown. Patient also has a history of scleroderma.    Assessment: Received UMR Transition of care referral on 07/30/17.Transition of care follow up completed, no care management needs, and will proceed with case closure.     Plan:RNCM will send patient successful outreach letter, [facility] pamphlet, and magnet. RNCM will complete case closure due to follow up completed / no care management needs.  RNCM will send patient the following educational hand out : Controlling Your Blood Pressure Through Lifestyle and send her a Hypertension video.   RNCM will send email to patient with requested contact information for [employer] and [employer].  RNCM will send request for [employer] Hypertension Management program referral to [facility] to send to [place].        Nohelani Benning H. Annia Friendly, BSN, [place] Management [facility] Telephonic CM Phone: [phone] Fax: [phone]

## 2017-08-06 ENCOUNTER — Other Ambulatory Visit (HOSPITAL_COMMUNITY): Payer: Self-pay | Admitting: Internal Medicine

## 2017-08-06 DIAGNOSIS — E041 Nontoxic single thyroid nodule: Secondary | ICD-10-CM

## 2017-08-06 NOTE — Telephone Encounter (Signed)
Referral to pulmonary placed per Dr. Acie Fredrickson for restrictive airway disease

## 2017-08-06 NOTE — Addendum Note (Signed)
Addended by: Emmaline Life on: 08/06/2017 02:19 PM   Modules accepted: Orders

## 2017-08-07 ENCOUNTER — Ambulatory Visit (INDEPENDENT_AMBULATORY_CARE_PROVIDER_SITE_OTHER): Payer: 59 | Admitting: Orthotics

## 2017-08-07 DIAGNOSIS — M7750 Other enthesopathy of unspecified foot: Secondary | ICD-10-CM

## 2017-08-07 DIAGNOSIS — M779 Enthesopathy, unspecified: Secondary | ICD-10-CM

## 2017-08-07 NOTE — Progress Notes (Signed)
Patient presents to day upon recommendation Dr Jacqualyn Posey for f/o to address sinus tarsi and subtalor cap.   Patient scanned for function f/o w/ long arch support, and valgus RF posted and lifted 1/8". Richy to fab

## 2017-08-09 ENCOUNTER — Other Ambulatory Visit: Payer: 59 | Admitting: Orthotics

## 2017-08-13 ENCOUNTER — Ambulatory Visit: Payer: Self-pay | Admitting: *Deleted

## 2017-08-14 ENCOUNTER — Other Ambulatory Visit (HOSPITAL_COMMUNITY): Payer: Self-pay | Admitting: Internal Medicine

## 2017-08-14 DIAGNOSIS — E041 Nontoxic single thyroid nodule: Secondary | ICD-10-CM

## 2017-08-15 ENCOUNTER — Encounter (HOSPITAL_COMMUNITY): Payer: Self-pay

## 2017-08-15 ENCOUNTER — Ambulatory Visit (HOSPITAL_COMMUNITY)
Admission: RE | Admit: 2017-08-15 | Discharge: 2017-08-15 | Disposition: A | Discharge status: Home / Self Care | Payer: 59 | Source: Ambulatory Visit | Attending: Internal Medicine | Admitting: Internal Medicine

## 2017-08-15 DIAGNOSIS — E041 Nontoxic single thyroid nodule: Secondary | ICD-10-CM | POA: Insufficient documentation

## 2017-08-15 DIAGNOSIS — E042 Nontoxic multinodular goiter: Secondary | ICD-10-CM | POA: Diagnosis not present

## 2017-08-15 MED ORDER — LIDOCAINE HCL (PF) 1 % IJ SOLN
INTRAMUSCULAR | Status: AC
  Administered 2017-08-15: 10 mL
  Filled 2017-08-15: qty 10

## 2017-08-15 MED ORDER — LIDOCAINE HCL (PF) 1 % IJ SOLN
INTRAMUSCULAR | Status: AC
  Administered 2017-08-15: 5 mL
  Filled 2017-08-15: qty 10

## 2017-08-17 MED FILL — BYSTOLIC 20 MG TABLET: 20 | 90 days supply | Qty: 90 | Fill #0

## 2017-08-20 ENCOUNTER — Encounter (HOSPITAL_COMMUNITY): Payer: Self-pay | Admitting: *Deleted

## 2017-08-20 ENCOUNTER — Other Ambulatory Visit: Payer: Self-pay

## 2017-08-20 ENCOUNTER — Emergency Department (HOSPITAL_COMMUNITY)
Admission: EM | Admit: 2017-08-20 | Discharge: 2017-08-20 | Disposition: A | Discharge status: Home / Self Care | Payer: 59 | Attending: Emergency Medicine | Admitting: Emergency Medicine

## 2017-08-20 DIAGNOSIS — I10 Essential (primary) hypertension: Secondary | ICD-10-CM | POA: Diagnosis not present

## 2017-08-20 DIAGNOSIS — M6281 Muscle weakness (generalized): Secondary | ICD-10-CM | POA: Diagnosis not present

## 2017-08-20 DIAGNOSIS — K2940 Chronic atrophic gastritis without bleeding: Secondary | ICD-10-CM | POA: Diagnosis not present

## 2017-08-20 DIAGNOSIS — Z79899 Other long term (current) drug therapy: Secondary | ICD-10-CM | POA: Insufficient documentation

## 2017-08-20 DIAGNOSIS — R1110 Vomiting, unspecified: Secondary | ICD-10-CM | POA: Diagnosis present

## 2017-08-20 DIAGNOSIS — R531 Weakness: Secondary | ICD-10-CM

## 2017-08-20 DIAGNOSIS — Z6820 Body mass index (BMI) 20.0-20.9, adult: Secondary | ICD-10-CM | POA: Diagnosis not present

## 2017-08-20 LAB — CBC WITH DIFFERENTIAL/PLATELET
Basophils Absolute: 0 10*3/uL (ref 0.0–0.1)
Basophils Relative: 0 %
Eosinophils Absolute: 0.2 10*3/uL (ref 0.0–0.7)
Eosinophils Relative: 2 %
HCT: 46.7 % — ABNORMAL HIGH (ref 36.0–46.0)
Hemoglobin: 14.4 g/dL (ref 12.0–15.0)
Lymphocytes Relative: 23 %
Lymphs Abs: 2.4 10*3/uL (ref 0.7–4.0)
MCH: 27.1 pg (ref 26.0–34.0)
MCHC: 30.8 g/dL (ref 30.0–36.0)
MCV: 87.9 fL (ref 78.0–100.0)
Monocytes Absolute: 0.8 10*3/uL (ref 0.1–1.0)
Monocytes Relative: 8 %
Neutro Abs: 6.9 10*3/uL (ref 1.7–7.7)
Neutrophils Relative %: 67 %
Platelets: 224 10*3/uL (ref 150–400)
RBC: 5.31 MIL/uL — ABNORMAL HIGH (ref 3.87–5.11)
RDW: 14.1 % (ref 11.5–15.5)
WBC: 10.3 10*3/uL (ref 4.0–10.5)

## 2017-08-20 LAB — COMPREHENSIVE METABOLIC PANEL
ALT: 15 U/L (ref 14–54)
AST: 25 U/L (ref 15–41)
Albumin: 4.2 g/dL (ref 3.5–5.0)
Alkaline Phosphatase: 78 U/L (ref 38–126)
Anion gap: 13 (ref 5–15)
BUN: 27 mg/dL — ABNORMAL HIGH (ref 6–20)
CO2: 25 mmol/L (ref 22–32)
Calcium: 11.1 mg/dL — ABNORMAL HIGH (ref 8.9–10.3)
Chloride: 99 mmol/L — ABNORMAL LOW (ref 101–111)
Creatinine, Ser: 0.92 mg/dL (ref 0.44–1.00)
GFR calc Af Amer: >60 mL/min (ref >60)
GFR calc non Af Amer: >60 mL/min (ref >60)
Glucose, Bld: 103 mg/dL — ABNORMAL HIGH (ref 65–99)
Potassium: 3.8 mmol/L (ref 3.5–5.1)
Sodium: 137 mmol/L (ref 135–145)
Total Bilirubin: 0.6 mg/dL (ref 0.3–1.2)
Total Protein: 8.9 g/dL — ABNORMAL HIGH (ref 6.5–8.1)

## 2017-08-20 LAB — URINALYSIS, ROUTINE W REFLEX MICROSCOPIC
Bilirubin Urine: NEGATIVE
Glucose, UA: NEGATIVE mg/dL
Hgb urine dipstick: NEGATIVE
Ketones, ur: NEGATIVE mg/dL
Nitrite: NEGATIVE
Protein, ur: 30 mg/dL — AB
Specific Gravity, Urine: 1.02 (ref 1.005–1.030)
pH: 5 (ref 5.0–8.0)

## 2017-08-20 MED ORDER — FAMOTIDINE IN NACL 20-0.9 MG/50ML-% IV SOLN
20.0000 mg | Freq: Once | INTRAVENOUS | Status: DC
  Filled 2017-08-20: qty 50

## 2017-08-20 MED ORDER — PANTOPRAZOLE SODIUM 20 MG PO TBEC: 20.0000 mg | DELAYED_RELEASE_TABLET | Freq: Two times a day (BID) | ORAL | 0 refills | Status: DC

## 2017-08-20 MED ORDER — ONDANSETRON 4 MG PO TBDP
4.0000 mg | ORAL_TABLET | Freq: Once | ORAL | Status: AC
  Administered 2017-08-20: 4 mg via ORAL
  Filled 2017-08-20: qty 1

## 2017-08-20 MED ORDER — PANTOPRAZOLE SODIUM 40 MG PO TBEC
40.0000 mg | DELAYED_RELEASE_TABLET | Freq: Once | ORAL | Status: AC
  Administered 2017-08-20: 40 mg via ORAL
  Filled 2017-08-20: qty 1

## 2017-08-20 MED ORDER — FAMOTIDINE 20 MG PO TABS
20.0000 mg | ORAL_TABLET | Freq: Once | ORAL | Status: AC
  Administered 2017-08-20: 20 mg via ORAL
  Filled 2017-08-20: qty 1

## 2017-08-20 NOTE — ED Triage Notes (Signed)
Pt states that she has been having problems with generalized weakness, n/v, sob for the past week, recently was admitted to [facility] hospital a few weeks ago, pt states that she has not gotten any better since being discharged, spoke with her pcp today and was told to be admitted to [facility] for further evaluation, [facility] did not have any beds so pt left [facility] and came to er,

## 2017-08-20 NOTE — Discharge Instructions (Addendum)
Your blood pressure today is much better than her recent hospitalization at [facility].  I reviewed some records from that hospitalization.  There was a question of a pulmonary nodule noted on chest x-ray but, when they did a subsequent CT scan which has higher definition, this was not noted.  I since some additional blood work screening for potential rheumatologic disease such as scleroderma, but these tests will be need to be followed up by your PCP.

## 2017-08-20 NOTE — ED Provider Notes (Signed)
[facility] EMERGENCY DEPARTMENT Provider Note   CSN: [id number] Arrival date & time: 08/20/17  1956     History   Chief Complaint Chief Complaint  Patient presents with  . Emesis    HPI Karen [name] is a 47 y.o. female.  HPI   47 year old female with a several month history of generalized weakness.  Dyspnea with exertion.  Reflux type symptoms.  She describes burning substernally and early satiety.  Symptoms had been progressing and she and family are frustrated but she does not have a clear diagnosis at this time.  They have numerous questions, particularly thyroid and pulmonary nodules.   Past Medical History:  Diagnosis Date  . Hypertension     Patient Active Problem List   Diagnosis Date Noted  . HTN (hypertension) 03/23/2017    History reviewed. No pertinent surgical history.   OB History   None      Home Medications    Prior to Admission medications   Medication Sig Start Date End Date Taking? Authorizing Provider  cloNIDine (CATAPRES - DOSED IN MG/24 HR) 0.1 mg/24hr patch Place 0.1 mg onto the skin once a week.    [provider]  cloNIDine-chlorthalidone (CLORPRES) 0.2-15 MG tablet Take 1 tablet by mouth daily.    [provider]  cyanocobalamin 1000 MCG tablet Take 1,000 mcg daily by mouth.    [provider]  meloxicam (MOBIC) 15 MG tablet Take 1 tablet (15 mg total) by mouth daily. Patient not taking: Reported on 08/02/2017 06/28/17 06/28/18  Trula Slade, DPM  Nebivolol HCl (BYSTOLIC) 20 MG TABS Take 20 mg by mouth.    [provider]  spironolactone (ALDACTONE) 50 MG tablet Take 50 mg by mouth daily.    [provider]  TURMERIC PO Take 1,500 mg by mouth daily.     [provider]  Vitamin D, Ergocalciferol, (DRISDOL) 50000 units CAPS capsule Take 50,000 Units by mouth every 7 (seven) days.    [provider]    Family History Family History  Problem Relation Age of Onset  .  Hypertension Father   . Hypertension Sister   . Hypertension Brother     Social History Social History   Tobacco Use  . Smoking status: Never Smoker  . Smokeless tobacco: Never Used  Substance Use Topics  . Alcohol use: No  . Drug use: No     Allergies   Lisinopril   Review of Systems Review of Systems  All systems reviewed and negative, other than as noted in HPI.  Physical Exam Updated Vital Signs BP (!) 120/97 (BP Location: Right Arm)   Pulse 87   Temp 97.9 F (36.6 C) (Oral)   Resp (!) 22   Ht 5\' 6"  (1.676 m)   Wt 58.5 kg (129 lb)   BMI 20.82 kg/m   Physical Exam  Constitutional: She appears well-developed and well-nourished. No distress.  HENT:  Head: Normocephalic and atraumatic.  Eyes: Conjunctivae are normal. Right eye exhibits no discharge. Left eye exhibits no discharge.  Neck: Neck supple.  Cardiovascular: Normal rate, regular rhythm and normal heart sounds. Exam reveals no gallop and no friction rub.  No murmur heard. Pulmonary/Chest: Effort normal and breath sounds normal. No respiratory distress.  Abdominal: Soft. She exhibits no distension. There is no tenderness.  Musculoskeletal: She exhibits no edema or tenderness.  Neurological: She is alert.  Skin: Skin is warm and dry.  Possibly some skin thickening of fingers. Fingers are warm to touch.  NVI.   Psychiatric: She has a normal mood and affect. Her behavior is normal. Thought content normal.  Nursing note and vitals reviewed.    ED Treatments / Results  Labs (all labs ordered are listed, but only abnormal results are displayed) Labs Reviewed  COMPREHENSIVE METABOLIC PANEL - Abnormal; Notable for the following components:      Result Value   Chloride 99 (*)    Glucose, Bld 103 (*)    BUN 27 (*)    Calcium 11.1 (*)    Total Protein 8.9 (*)    All other components within normal limits  CBC WITH DIFFERENTIAL/PLATELET - Abnormal; Notable for the following components:   RBC 5.31 (*)     HCT 46.7 (*)    All other components within normal limits  URINALYSIS, ROUTINE W REFLEX MICROSCOPIC - Abnormal; Notable for the following components:   APPearance CLOUDY (*)    Protein, ur 30 (*)    Leukocytes, UA MODERATE (*)    Bacteria, UA RARE (*)    Squamous Epithelial / LPF 6-30 (*)    All other components within normal limits  ANTINUCLEAR ANTIBODIES, IFA  ANTI-SCLERODERMA ANTIBODY  CENTROMERE ANTIBODIES  RHEUMATOID FACTOR    EKG EKG Interpretation  Date/Time:  Monday August 20 2017 21:21:39 EDT Ventricular Rate:  82 PR Interval:    QRS Duration: 84 QT Interval:  383 QTC Calculation: 448 R Axis:   -11 Text Interpretation:  Sinus rhythm Abnormal R-wave progression, early transition Inferior infarct, old Confirmed by Virgel Manifold [phone]) on 08/20/2017 10:41:44 PM   Radiology No results found.  Procedures Procedures (including critical care time)  Medications Ordered in ED Medications  pantoprazole (PROTONIX) EC tablet 40 mg (40 mg Oral Given 08/20/17 2148)  ondansetron (ZOFRAN-ODT) disintegrating tablet 4 mg (4 mg Oral Given 08/20/17 2148)  famotidine (PEPCID) tablet 20 mg (20 mg Oral Given 08/20/17 2211)     Initial Impression / Assessment and Plan / ED Course  I have reviewed the triage vital signs and the nursing notes.  Pertinent labs & imaging results that were available during my care of the patient were reviewed by me and considered in my medical decision making (see chart for details).     47 year old female with multiple complaints.  Progressing over several months.  She is afebrile.  Nontoxic.  Hemopdynamically stable.  I am unsure as to the exact etiology, but I doubt emergent condition.    She may potentially have scleroderma.  She reports skin changes, particularly in her hands.  She also describes pulmonary symptoms, reflux type symptoms and also recently admitted for hypertensive emergency.  All of these things can potentially be associated with  scleroderma although the diagnosis and workup of this is really outside the scope of emergency medicine.  I reviewed her thyroid biopsy results with her and gave her copies of her pathology reports which were benign.  I reviewed records from most recent hospitalization at [facility]. I explained that what was felt to be a pulmonary nodule on x-ray was subsequently not seen when she had her CT scan. Her blood pressure is reasonable today. Will start her on a PPI for possible GERD. She recently had PFTs done. I sent some rheumatologic studies today, but explained that these need followed-up as an outpt.   I agree that she needs further evaluation but I do not find indication for acute hospitalization at this time.   Final Clinical Impressions(s) / ED Diagnoses   Final diagnoses:  Generalized weakness  ED Discharge Orders    None       Virgel Manifold, MD 08/20/17 2332

## 2017-08-22 LAB — ANTINUCLEAR ANTIBODIES, IFA: ANA Ab, IFA: NEGATIVE

## 2017-08-22 LAB — ANTI-SCLERODERMA ANTIBODY: Scleroderma (Scl-70) (ENA) Antibody, IgG: <0.2 AI (ref 0.0–0.9)

## 2017-08-22 LAB — CENTROMERE ANTIBODIES: Centromere Ab Screen: <0.2 AI (ref 0.0–0.9)

## 2017-08-22 LAB — RHEUMATOID FACTOR: Rhuematoid fact SerPl-aCnc: 11.8 IU/mL (ref 0.0–13.9)

## 2017-08-30 ENCOUNTER — Ambulatory Visit (INDEPENDENT_AMBULATORY_CARE_PROVIDER_SITE_OTHER): Payer: 59 | Admitting: "Endocrinology

## 2017-08-30 ENCOUNTER — Other Ambulatory Visit: Payer: 59 | Admitting: Orthotics

## 2017-08-30 ENCOUNTER — Encounter: Payer: Self-pay | Admitting: "Endocrinology

## 2017-08-30 VITALS — BP 125/85 | HR 89 | Ht 65.0 in | Wt 131.0 lb

## 2017-08-30 DIAGNOSIS — E042 Nontoxic multinodular goiter: Secondary | ICD-10-CM

## 2017-08-30 DIAGNOSIS — M3500 Sicca syndrome, unspecified: Secondary | ICD-10-CM | POA: Diagnosis not present

## 2017-08-30 HISTORY — DX: Sjogren syndrome, unspecified: M35.00

## 2017-08-30 HISTORY — DX: Nontoxic multinodular goiter: E04.2

## 2017-08-30 NOTE — Progress Notes (Signed)
Endocrinology Consult Note                                            08/30/2017, 6:04 PM   Subjective:    Patient ID: Karen [name], female    DOB: 09-23-1970, PCP Neale Burly, MD   Past Medical History:  Diagnosis Date  . Hypertension    History reviewed. No pertinent surgical history. Social History   Socioeconomic History  . Marital status: Single    Spouse name: Not on file  . Number of children: Not on file  . Years of education: Not on file  . Highest education level: Not on file  Occupational History  . Not on file  Social Needs  . Financial resource strain: Not on file  . Food insecurity:    Worry: Not on file    Inability: Not on file  . Transportation needs:    Medical: Not on file    Non-medical: Not on file  Tobacco Use  . Smoking status: Never Smoker  . Smokeless tobacco: Never Used  Substance and Sexual Activity  . Alcohol use: No  . Drug use: No  . Sexual activity: Not on file  Lifestyle  . Physical activity:    Days per week: Not on file    Minutes per session: Not on file  . Stress: Not on file  Relationships  . Social connections:    Talks on phone: Not on file    Gets together: Not on file    Attends religious service: Not on file    Active member of club or organization: Not on file    Attends meetings of clubs or organizations: Not on file    Relationship status: Not on file  Other Topics Concern  . Not on file  Social History Narrative  . Not on file   Outpatient Encounter Medications as of 08/30/2017  Medication Sig  . chlorthalidone (HYGROTON) 25 MG tablet daily.  . cyanocobalamin 1000 MCG tablet Take 1,000 mcg daily by mouth.  . Nebivolol HCl (BYSTOLIC) 20 MG TABS Take 20 mg by mouth.  . pantoprazole (PROTONIX) 20 MG tablet Take 1 tablet (20 mg total) by mouth 2 (two) times daily before a meal.  . spironolactone (ALDACTONE) 50 MG tablet Take 50 mg by mouth 2 (two) times daily.   . Vitamin D, Ergocalciferol,  (DRISDOL) 50000 units CAPS capsule Take 50,000 Units by mouth every 7 (seven) days.  . [DISCONTINUED] cloNIDine (CATAPRES - DOSED IN MG/24 HR) 0.1 mg/24hr patch Place 0.1 mg onto the skin once a week.  . [DISCONTINUED] cloNIDine-chlorthalidone (CLORPRES) 0.2-15 MG tablet Take 1 tablet by mouth daily.  . [DISCONTINUED] meloxicam (MOBIC) 15 MG tablet Take 1 tablet (15 mg total) by mouth daily. (Patient not taking: Reported on 08/02/2017)  . [DISCONTINUED] TURMERIC PO Take 1,500 mg by mouth daily.    No facility-administered encounter medications on file as of 08/30/2017.    ALLERGIES: Allergies  Allergen Reactions  . Lisinopril Hives and Swelling    VACCINATION STATUS:  There is no immunization history on file for this patient.  HPI Karen [name] is 47 y.o. female who presents today with a medical history as above. she is being seen in consultation for multinodular goiter requested by Neale Burly, MD.  She has been dealing with several nonspecific body symptoms including  fatigue, weight loss, poor appetite, xerostomia. -She underwent thyroid examination and ultrasound on July 25, 2017 which showed 3.5 cm nodule on the right lobe which measured 5.4 cm x 3.30CM x 2.8 cm ; and 2.5 cm nodule in the left lobe which measures 5.5 cm x 1.1 cm x 1.8 cm. -She underwent subsequent fine-needle aspiration of these dominant nodules on bilateral thyroid lobes on August 15, 2017 with benign findings-benign follicular nodule. -She also underwent thyroid function test on May 23, 2017 which were within normal limits.  Patient did not require any thyroid hormone replacement or supplement nor antithyroid therapy. -She was recently diagnosed with scleroderma and Sjogren's syndrome with associated dysphagia, and odynophagia.  This is made worse by her xerostomia. -She has lost about 8 pounds in the last month.  She denies heat/cold intolerance.  Denies tremors, palpitations, diarrhea . -She denies family  history of thyroid dysfunction nor thyroid cancer.  She is on treatment for hypertension with chlorthalidone,  Bystolic, and Spironolactone.  Review of Systems  Constitutional: + weight loss, + fatigue, no subjective hyperthermia, no subjective hypothermia Eyes: no blurry vision, + xerophthalmia ENT: no sore throat, +nodules palpated in the thyroid, + dysphagia, + odynophagia, no hoarseness Cardiovascular: no Chest Pain, no Shortness of Breath, no palpitations, no leg swelling Respiratory: no cough, no SOB Gastrointestinal: + Nausea, + constipation, - Diarhhea Musculoskeletal: no muscle/joint aches Skin: no rashes Neurological: no tremors, no numbness, no tingling, no dizziness Psychiatric: no depression, no anxiety  Objective:    BP 125/85   Pulse 89   Ht 5\' 5"  (1.651 m)   Wt 131 lb (59.4 kg)   BMI 21.80 kg/m   Wt Readings from Last 3 Encounters:  08/30/17 131 lb (59.4 kg)  08/20/17 129 lb (58.5 kg)  07/20/17 139 lb (63 kg)    Physical Exam  Constitutional: + Appropriate weight for height, not in acute distress, normal state of mind Eyes: PERRLA, EOMI, no exophthalmos ENT: moist mucous membranes, + nodular thyromegaly, no cervical lymphadenopathy Cardiovascular: normal precordial activity, Regular Rate and Rhythm, no Murmur/Rubs/Gallops Respiratory:  adequate breathing efforts, no gross chest deformity, Clear to auscultation bilaterally Gastrointestinal: abdomen soft, Non -tender, No distension, Bowel Sounds present Musculoskeletal: no gross deformities, strength intact in all four extremities Skin: Her skin is shiny, tight and leathery probably related to her scleroderma. Neurological: no tremor with outstretched hands, Deep tendon reflexes normal in all four extremities.  CMP ( most recent) CMP     Component Value Date/Time   NA 137 08/20/2017 2225   NA 140 04/02/2017 1414   K 3.8 08/20/2017 2225   CL 99 (L) 08/20/2017 2225   CO2 25 08/20/2017 2225   GLUCOSE 103  (H) 08/20/2017 2225   BUN 27 (H) 08/20/2017 2225   BUN 9 04/02/2017 1414   CREATININE 0.92 08/20/2017 2225   CALCIUM 11.1 (H) 08/20/2017 2225   PROT 8.9 (H) 08/20/2017 2225   PROT 8.5 03/22/2017 0948   ALBUMIN 4.2 08/20/2017 2225   ALBUMIN 4.6 03/22/2017 0948   AST 25 08/20/2017 2225   ALT 15 08/20/2017 2225   ALKPHOS 78 08/20/2017 2225   BILITOT 0.6 08/20/2017 2225   BILITOT 0.4 03/22/2017 0948   GFRNONAA >60 08/20/2017 2225   GFRAA >60 08/20/2017 2225   Lipid Panel     Component Value Date/Time   CHOL 187 03/22/2017 0948   TRIG 130 03/22/2017 0948   HDL 43 03/22/2017 0948   CHOLHDL 4.3 03/22/2017 0948   LDLCALC 118 (  H) 03/22/2017 0948      Results for CALIOPE, RUPPERT (MRN [id number]) as of 08/31/2017 09:18  Ref. Range 05/23/2017 16:57  TSH Latest Ref Range: 0.450 - 4.500 uIU/mL 1.430  Thyroxine (T4) Latest Ref Range: 4.5 - 12.0 ug/dL 8.0  Free Thyroxine Index Latest Ref Range: 1.2 - 4.9  2.0  T3 Uptake Ratio Latest Ref Range: 24 - 39 % 25     Assessment & Plan:   1. Multinodular goiter 2. Sjogren's syndrome, with unspecified organ involvement   - Karen [name]  is being seen at a kind request of Hasanaj, Samul Dada, MD. - I have reviewed her available thyroid records and clinically evaluated the patient. - Based on  reviews, she has euthyroid multinodular goiter, with already  complete work-up including fine-needle aspiration of bilateral highly dominant thyroid nodules with benign findings.   -She will not require any thyroid intervention at this time. -However, she will need repeat thyroid function tests in 3 months with office visit.  Her next labs will include TSH, free T4/free T3, antithyroid antibodies. -Her multiple nonspecific symptoms are not due to any thyroid dysfunction, however, likely related to her scleroderma/Sjogren's syndrome constellations with dysphagia and odynophagia made worse by xerostomia.  She will benefit better with evaluation with a  rheumatologist. - I did not initiate any new prescriptions today. - I advised patient to maintain close follow up with Neale Burly, MD for primary care needs.   - Time spent with the patient: 45 minutes, of which >50% was spent in obtaining information about her symptoms, reviewing her previous labs, evaluations, and treatments, counseling her about her multinodular goiter, and developing a plan to confirm the diagnosis and long term treatment as necessary.  Karen [name] participated in the discussions, expressed understanding, and voiced agreement with the above plans.  All questions were answered to her satisfaction. she is encouraged to contact clinic should she have any questions or concerns prior to her return visit.  Follow up plan: Return in about 3 months (around 11/29/2017) for follow up with pre-visit labs.   Glade Lloyd, MD [facility] [address] [place], [place] [address] Phone: [phone]  Fax: [phone]     08/30/2017, 6:04 PM  This note was partially dictated with voice recognition software. Similar sounding words can be transcribed inadequately or may not  be corrected upon review.

## 2017-09-03 ENCOUNTER — Other Ambulatory Visit: Payer: 59 | Admitting: Orthotics

## 2017-09-03 ENCOUNTER — Ambulatory Visit: Payer: 59 | Admitting: "Endocrinology

## 2017-09-03 DIAGNOSIS — M349 Systemic sclerosis, unspecified: Secondary | ICD-10-CM | POA: Diagnosis not present

## 2017-09-03 DIAGNOSIS — M545 Low back pain: Secondary | ICD-10-CM | POA: Diagnosis not present

## 2017-09-03 DIAGNOSIS — R0602 Shortness of breath: Secondary | ICD-10-CM | POA: Diagnosis not present

## 2017-09-03 DIAGNOSIS — R5383 Other fatigue: Secondary | ICD-10-CM | POA: Diagnosis not present

## 2017-09-03 DIAGNOSIS — Z6822 Body mass index (BMI) 22.0-22.9, adult: Secondary | ICD-10-CM | POA: Diagnosis not present

## 2017-09-03 DIAGNOSIS — M791 Myalgia, unspecified site: Secondary | ICD-10-CM | POA: Diagnosis not present

## 2017-09-05 ENCOUNTER — Telehealth (HOSPITAL_COMMUNITY): Payer: Self-pay | Admitting: *Deleted

## 2017-09-05 DIAGNOSIS — M349 Systemic sclerosis, unspecified: Secondary | ICD-10-CM

## 2017-09-05 DIAGNOSIS — R0600 Dyspnea, unspecified: Secondary | ICD-10-CM

## 2017-09-05 NOTE — Telephone Encounter (Signed)
Received referral from Dr Trudie Reed office, pt needs echo and appt w/DR Bensimhon ASAP for scleroderma and increased SOB/fatigue.  appts sch for 09/13/17

## 2017-09-06 ENCOUNTER — Encounter: Payer: Self-pay | Admitting: Pulmonary Disease

## 2017-09-06 ENCOUNTER — Telehealth: Payer: Self-pay

## 2017-09-06 ENCOUNTER — Ambulatory Visit (INDEPENDENT_AMBULATORY_CARE_PROVIDER_SITE_OTHER): Payer: 59 | Admitting: Pulmonary Disease

## 2017-09-06 DIAGNOSIS — J849 Interstitial pulmonary disease, unspecified: Secondary | ICD-10-CM | POA: Diagnosis not present

## 2017-09-06 NOTE — Telephone Encounter (Signed)
Record release has been faxed to Dr. Trudie Reed requesting last OV/labs. Will await records.

## 2017-09-06 NOTE — Patient Instructions (Signed)
I will schedule you for high-resolution CT for better evaluation of your lungs Please follow-up with Dr. Haroldine Laws for evaluation of pulmonary hypertension I will also try to get records from Dr. Trudie Reed about your scleroderma  We will follow-up with you in 2 to 4 weeks for review.

## 2017-09-06 NOTE — Progress Notes (Addendum)
Karen [name]    [id number]    1970/05/14  Primary Care Physician:Hasanaj, Samul Dada, MD  Referring Physician: Neale Burly, MD [address] [place], [place] [address]  Chief complaint: Consult for evaluation of abnormal PFTs, scleroderma  HPI: 47 year old with history of hypertension, headaches.  She has complaints of muscle ache, joint pain, fatigue, Raynaud's syndrome and was evaluated by Dr. Trudie Reed, rheumatology earlier this year.  She has a new diagnosis of scleroderma and has been referred to pulmonary for abnormal PFTs showing restriction in diffusion impairment. She also has been referred to Dr. Haroldine Laws, Cardiology for evaluation of pulmonary hypertension.   She has significant issues with dyspnea and gets short of breath walking 5 to 10 m on flat surface.  She does not have symptoms at rest.  Complains of cough, nonproductive in nature.  Denies any wheezing, fevers, chills.  Denies any lower extremity edema Has acid reflux and is on Protonix.  Reports that sometimes food gets stuck while swallowing.  She had a hospitalization in [facility] in March 2019 for hypertension.  A CT chest scan at that time showed subtle lower lung findings of atelectasis, groundglass.  She has a right thyroid nodule which was biopsied on 08/15/17 with pathology showing benign findings.  Thyroid function tests are normal.  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  No mold. No hot tub, Jacuzzi. She has down comforter.  Smoking history: Never smoker Travel history: Grew up in [place].  Lived in [place] and [place] Relevant family history: No family history of lung disease.   Outpatient Encounter Medications as of 09/06/2017  Medication Sig  . chlorthalidone (HYGROTON) 25 MG tablet daily.  . cyanocobalamin 1000 MCG tablet Take 1,000 mcg daily by mouth.  . Nebivolol HCl (BYSTOLIC) 20 MG TABS  Take 20 mg by mouth.  . pantoprazole (PROTONIX) 20 MG tablet Take 1 tablet (20 mg total) by mouth 2 (two) times daily before a meal.  . spironolactone (ALDACTONE) 50 MG tablet Take 50 mg by mouth 2 (two) times daily.   . Vitamin D, Ergocalciferol, (DRISDOL) 50000 units CAPS capsule Take 50,000 Units by mouth every 7 (seven) days.   No facility-administered encounter medications on file as of 09/06/2017.     Allergies as of 09/06/2017 - Review Complete 09/06/2017  Allergen Reaction Noted  . Lisinopril Hives and Swelling 08/13/2015    Past Medical History:  Diagnosis Date  . Hypertension     No past surgical history on file.  Family History  Problem Relation Age of Onset  . Hypertension Father   . Hypertension Sister   . Hypertension Brother     Social History   Socioeconomic History  . Marital status: Single    Spouse name: Not on file  . Number of children: Not on file  . Years of education: Not on file  . Highest education level: Not on file  Occupational History  . Not on file  Social Needs  . Financial resource strain: Not on file  . Food insecurity:    Worry: Not on file    Inability: Not on file  . Transportation needs:    Medical: Not on file    Non-medical: Not on file  Tobacco Use  . Smoking status: Never Smoker  . Smokeless tobacco: Never Used  Substance and Sexual Activity  . Alcohol use: No  .  Drug use: No  . Sexual activity: Not on file  Lifestyle  . Physical activity:    Days per week: Not on file    Minutes per session: Not on file  . Stress: Not on file  Relationships  . Social connections:    Talks on phone: Not on file    Gets together: Not on file    Attends religious service: Not on file    Active member of club or organization: Not on file    Attends meetings of clubs or organizations: Not on file    Relationship status: Not on file  . Intimate partner violence:    Fear of current or ex partner: Not on file    Emotionally abused:  Not on file    Physically abused: Not on file    Forced sexual activity: Not on file  Other Topics Concern  . Not on file  Social History Narrative  . Not on file   Review of systems: Review of Systems  Constitutional: Negative for fever and chills.  HENT: Negative.   Eyes: Negative for blurred vision.  Respiratory: as per HPI  Cardiovascular: Negative for chest pain and palpitations.  Gastrointestinal: Negative for vomiting, diarrhea, blood per rectum. Genitourinary: Negative for dysuria, urgency, frequency and hematuria.  Musculoskeletal: Negative for myalgias, back pain and joint pain.  Skin: Negative for itching and rash.  Neurological: Negative for dizziness, tremors, focal weakness, seizures and loss of consciousness.  Endo/Heme/Allergies: Negative for environmental allergies.  Psychiatric/Behavioral: Negative for depression, suicidal ideas and hallucinations.  All other systems reviewed and are negative.  Physical Exam: Blood pressure 112/78, pulse (!) 103, height 5\' 5"  (1.651 m), weight 129 lb (58.5 kg), SpO2 98 %. Gen:      No acute distress HEENT:  EOMI, sclera anicteric Neck:     No masses; no thyromegaly Lungs:    Clear to auscultation bilaterally; normal respiratory effort CV:         Regular rate and rhythm; no murmurs Abd:      + bowel sounds; soft, non-tender; no palpable masses, no distension Ext:    No edema; adequate peripheral perfusion Skin:      Warm and dry; no rash Neuro: alert and oriented x 3 Psych: normal mood and affect  Data Reviewed: FENO 09/06/2017-unable to complete  PFTs 07/27/2017 FVC 1.65 (54%), FEV1 1.53 (2%], F/F 93, TLC 50, DLCO 44%, DLCO/VA 131% Severe restriction and diffusion impairment.  CT chest, [facility] 07/24/2017 Dependent atelectasis, subtle groundglass opacities at the base.  2.5 cm right thyroid nodule.  I have reviewed the images personally  Assessment:  Abnormal PFTs PFT showed restriction and diffusion impairment.   Given her recent diagnosis of scleroderma she will need further evaluation for interstitial lung disease Previous CT shows subtle changes with ground glass abnormalities at the bases which can be better characterized on high-resolution CT The PFTs changes may also be caused by pulmonary hypertension and she is scheduled for evaluation of this next week  Follow-up in 2 to 4 weeks for review of tests and plan for further steps. Obtain records from Dr. Trudie Reed.  Plan/Recommendations: - High-resolution CT of the chest - Obtain records from rheumatology office.  Marshell Garfinkel MD [facility] 09/06/2017, 1:45 PM  CC: Neale Burly, MD  Addendum: Reviewed note from Dr. Trudie Reed, rheumatology 07/12/2017 Seen for joint pain, Raynaud's, elevated CK, skin thickening Serologies positive for Sjogren's negative,, normal CK and aldolase.  Rheumatoid factor < 10,  CCP-8, ANCA-negative, double-stranded DNA-negative, Smith antibody-negative, SCL 70-negative, RNP-negative SSA 5.8, SSB 1.5 Smith antibody-negative, ANA IFA-negative Myositis panel-negative CK 165, C4-30, C3-168, total complement greater than 60, aldolase 8.8 Sed rate 9, CRP 0.9  WBC 6.8, hemoglobin 12.6, platelets 263, eos 2% LFTs, metabolic panel-normal  Marshell Garfinkel MD [facility] 09/20/2017, 9:03 AM

## 2017-09-07 ENCOUNTER — Telehealth: Payer: Self-pay | Admitting: Pulmonary Disease

## 2017-09-07 NOTE — Telephone Encounter (Signed)
Pt requesting a copy of yesterday's OV note to be mailed to her.  This has been placed in outgoing mail to address on file.  Nothing further needed.

## 2017-09-10 DIAGNOSIS — E559 Vitamin D deficiency, unspecified: Secondary | ICD-10-CM | POA: Diagnosis not present

## 2017-09-10 DIAGNOSIS — M3481 Systemic sclerosis with lung involvement: Secondary | ICD-10-CM | POA: Diagnosis not present

## 2017-09-10 DIAGNOSIS — I10 Essential (primary) hypertension: Secondary | ICD-10-CM | POA: Diagnosis not present

## 2017-09-10 DIAGNOSIS — R5383 Other fatigue: Secondary | ICD-10-CM | POA: Diagnosis not present

## 2017-09-10 NOTE — Telephone Encounter (Signed)
Karen [name] is calling from Dr. Trudie Reed office she received the medication list. Karen [name] was wondering if  there was anything else that was supposed to be at the end of the medication list.

## 2017-09-10 NOTE — Telephone Encounter (Signed)
Records have been received and placed in Dr. Mannam's cubby for review.  Nothing further is needed.  

## 2017-09-10 NOTE — Telephone Encounter (Signed)
Called and spoke with Volin at [phone]. Clarified with Lesleigh Noe what all is needed regarding this situation. Our office needs from Dr. Trudie Reed office the last OV and labs. Jenny Reichmann stated that she will send this to triage and gave our fax number. Will await fax.

## 2017-09-11 ENCOUNTER — Other Ambulatory Visit (HOSPITAL_COMMUNITY): Payer: Self-pay | Admitting: Internal Medicine

## 2017-09-11 DIAGNOSIS — R1310 Dysphagia, unspecified: Secondary | ICD-10-CM

## 2017-09-12 DIAGNOSIS — M3481 Systemic sclerosis with lung involvement: Secondary | ICD-10-CM | POA: Diagnosis not present

## 2017-09-12 DIAGNOSIS — E559 Vitamin D deficiency, unspecified: Secondary | ICD-10-CM | POA: Diagnosis not present

## 2017-09-12 DIAGNOSIS — R5383 Other fatigue: Secondary | ICD-10-CM | POA: Diagnosis not present

## 2017-09-12 DIAGNOSIS — I10 Essential (primary) hypertension: Secondary | ICD-10-CM | POA: Diagnosis not present

## 2017-09-13 ENCOUNTER — Encounter (HOSPITAL_COMMUNITY): Payer: Self-pay | Admitting: *Deleted

## 2017-09-13 ENCOUNTER — Other Ambulatory Visit (HOSPITAL_COMMUNITY): Payer: Self-pay | Admitting: *Deleted

## 2017-09-13 ENCOUNTER — Ambulatory Visit (HOSPITAL_COMMUNITY)
Admission: RE | Admit: 2017-09-13 | Discharge: 2017-09-13 | Disposition: A | Discharge status: Home / Self Care | Payer: 59 | Source: Ambulatory Visit | Attending: Internal Medicine | Admitting: Internal Medicine

## 2017-09-13 ENCOUNTER — Encounter (HOSPITAL_COMMUNITY): Payer: Self-pay | Admitting: Internal Medicine

## 2017-09-13 ENCOUNTER — Ambulatory Visit (HOSPITAL_BASED_OUTPATIENT_CLINIC_OR_DEPARTMENT_OTHER)
Admission: RE | Admit: 2017-09-13 | Discharge: 2017-09-13 | Disposition: A | Discharge status: Home / Self Care | Payer: 59 | Source: Ambulatory Visit | Attending: Internal Medicine | Admitting: Internal Medicine

## 2017-09-13 VITALS — BP 142/84 | Wt 135.0 lb

## 2017-09-13 DIAGNOSIS — I272 Pulmonary hypertension, unspecified: Secondary | ICD-10-CM

## 2017-09-13 DIAGNOSIS — J8410 Pulmonary fibrosis, unspecified: Secondary | ICD-10-CM | POA: Insufficient documentation

## 2017-09-13 DIAGNOSIS — M349 Systemic sclerosis, unspecified: Secondary | ICD-10-CM | POA: Insufficient documentation

## 2017-09-13 DIAGNOSIS — M3500 Sicca syndrome, unspecified: Secondary | ICD-10-CM | POA: Insufficient documentation

## 2017-09-13 DIAGNOSIS — Z888 Allergy status to other drugs, medicaments and biological substances status: Secondary | ICD-10-CM | POA: Insufficient documentation

## 2017-09-13 DIAGNOSIS — I10 Essential (primary) hypertension: Secondary | ICD-10-CM | POA: Insufficient documentation

## 2017-09-13 DIAGNOSIS — R0600 Dyspnea, unspecified: Secondary | ICD-10-CM

## 2017-09-13 DIAGNOSIS — E042 Nontoxic multinodular goiter: Secondary | ICD-10-CM | POA: Diagnosis not present

## 2017-09-13 DIAGNOSIS — Z79899 Other long term (current) drug therapy: Secondary | ICD-10-CM | POA: Diagnosis not present

## 2017-09-13 LAB — PROTIME-INR
INR: 0.99
PROTHROMBIN TIME: 13 s (ref 11.4–15.2)

## 2017-09-13 LAB — BASIC METABOLIC PANEL
Anion gap: 8 (ref 5–15)
BUN: 7 mg/dL (ref 6–20)
CALCIUM: 10.4 mg/dL — AB (ref 8.9–10.3)
CO2: 32 mmol/L (ref 22–32)
CREATININE: 0.58 mg/dL (ref 0.44–1.00)
Chloride: 102 mmol/L (ref 101–111)
GFR calc non Af Amer: >60 mL/min (ref >60)
GLUCOSE: 96 mg/dL (ref 65–99)
Potassium: 2.9 mmol/L — ABNORMAL LOW (ref 3.5–5.1)
Sodium: 142 mmol/L (ref 135–145)

## 2017-09-13 LAB — CBC
HCT: 38.2 % (ref 36.0–46.0)
Hemoglobin: 11.9 g/dL — ABNORMAL LOW (ref 12.0–15.0)
MCH: 27.5 pg (ref 26.0–34.0)
MCHC: 31.2 g/dL (ref 30.0–36.0)
MCV: 88.4 fL (ref 78.0–100.0)
PLATELETS: 234 10*3/uL (ref 150–400)
RBC: 4.32 MIL/uL (ref 3.87–5.11)
RDW: 14.2 % (ref 11.5–15.5)
WBC: 7 10*3/uL (ref 4.0–10.5)

## 2017-09-13 NOTE — Patient Instructions (Signed)
Right Heart Catheterization on Thur 09/27/17, see instruction sheet  Labs today  Your physician recommends that you schedule a follow-up appointment in: 4 months

## 2017-09-13 NOTE — Progress Notes (Signed)
Advanced Heart Failure Clinic Note   Referring Physician: Dr Posey Rea PCP: [name], MD PCP-Cardiologist: Mertie Moores, MD   HPI: Karen [name] is a 47 y.o. female [place] at [facility] (with [name]) with a history of Sjogren's syndrome, multinodular goiter, HTN and recently diagnosed scleroderma referred by Dr. Gavin Pound for evaluation of pulmonary HTN in the setting of new diagnosis of scleroderma.  Denies any h/o known cardiac disease. Nonsmoker. Reports that she sets SOB with mild activity and now has been out of work due to exertional fatigue and dyspnea. Gets some LE edema when BP up. No orthopnea or edema. Occasional lightheadedness but no syncope. Occasional brief palipitations Has seen Dr. Vaughan Browner who ordered hi-resolution CT to further evaluate for lung disease. + arthitic pain, Raynaud's and GERD.   She had a hospitalization in [facility] in March 2019 for hypertension.  A CT chest scan at that time showed subtle lower lung findings of atelectasis, groundglass.  She has a right thyroid nodule which was biopsied on 08/15/17 with pathology showing benign findings.  Thyroid function tests are normal.  Echo today: EF 60-65% grade I DD RV normal. Mild TR.   PFTs 07/27/17: FVC 1.7 L, 56% FEV-1 1.5 1L, 62% DLCO 44%   Review of Systems: [y] = yes, [ ]  = no   General: Weight gain [ ] ; Weight loss [ ] ; Anorexia [ ] ; Fatigue Blue.Reese ]; Fever [ ] ; Chills [ ] ; Weakness [ ]   Cardiac: Chest pain/pressure [ ] ; Resting SOB [ ] ; Exertional SOB Blue.Reese ]; Orthopnea [ ] ; Pedal Edema [ y]; Palpitations Blue.Reese ]; Syncope [ ] ; Presyncope [ ] ; Paroxysmal nocturnal dyspnea[ ]   Pulmonary: Cough [ ] ; Wheezing[ ] ; Hemoptysis[ ] ; Sputum [ ] ; Snoring [ ]   GI: Vomiting[ ] ; Dysphagia[ ] ; Melena[ ] ; Hematochezia [ ] ; Heartburn[y ]; Abdominal pain [ ] ; Constipation [ ] ; Diarrhea [ ] ; BRBPR [ ]   GU: Hematuria[ ] ; Dysuria [ ] ; Nocturia[ ]   Vascular: Pain in legs with walking [ ] ; Pain in feet with  lying flat [ ] ; Non-healing sores [ ] ; Stroke [ ] ; TIA [ ] ; Slurred speech [ ] ;  Neuro: Headaches[ ] ; Vertigo[ ] ; Seizures[ ] ; Paresthesias[ ] ;Blurred vision [ ] ; Diplopia [ ] ; Vision changes [ ]   Ortho/Skin: Arthritis [ y]; Joint pain [ y]; Muscle pain [ ] ; Joint swelling [ ] ; Back Pain [ ] ; Rash [ ]   Psych: Depression[ ] ; Anxiety[ ]   Heme: Bleeding problems [ ] ; Clotting disorders [ ] ; Anemia [ ]   Endocrine: Diabetes [ ] ; Thyroid dysfunction[ ]    Past Medical History:  Diagnosis Date  . Hypertension     Current Outpatient Medications  Medication Sig Dispense Refill  . chlorthalidone (HYGROTON) 25 MG tablet daily.    . cyanocobalamin 1000 MCG tablet Take 1,000 mcg daily by mouth.    . Nebivolol HCl (BYSTOLIC) 20 MG TABS Take 20 mg by mouth.    . pantoprazole (PROTONIX) 20 MG tablet Take 1 tablet (20 mg total) by mouth 2 (two) times daily before a meal. 60 tablet 0  . spironolactone (ALDACTONE) 50 MG tablet Take 50 mg by mouth 2 (two) times daily.     . Vitamin D, Ergocalciferol, (DRISDOL) 50000 units CAPS capsule Take 50,000 Units by mouth every 7 (seven) days.     No current facility-administered medications for this encounter.     Allergies  Allergen Reactions  . Lisinopril Hives and Swelling      Social History   Socioeconomic  History  . Marital status: Single    Spouse name: Not on file  . Number of children: Not on file  . Years of education: Not on file  . Highest education level: Not on file  Occupational History  . Not on file  Social Needs  . Financial resource strain: Not on file  . Food insecurity:    Worry: Not on file    Inability: Not on file  . Transportation needs:    Medical: Not on file    Non-medical: Not on file  Tobacco Use  . Smoking status: Never Smoker  . Smokeless tobacco: Never Used  Substance and Sexual Activity  . Alcohol use: No  . Drug use: No  . Sexual activity: Not on file  Lifestyle  . Physical activity:    Days per week: Not  on file    Minutes per session: Not on file  . Stress: Not on file  Relationships  . Social connections:    Talks on phone: Not on file    Gets together: Not on file    Attends religious service: Not on file    Active member of club or organization: Not on file    Attends meetings of clubs or organizations: Not on file    Relationship status: Not on file  . Intimate partner violence:    Fear of current or ex partner: Not on file    Emotionally abused: Not on file    Physically abused: Not on file    Forced sexual activity: Not on file  Other Topics Concern  . Not on file  Social History Narrative  . Not on file      Family History  Problem Relation Age of Onset  . Hypertension Father   . Hypertension Sister   . Hypertension Brother     Vitals:   09/13/17 1530  BP: (!) 142/84  Weight: 135 lb (61.2 kg)     PHYSICAL EXAM: General:  Well appearing. No respiratory difficulty HEENT: normal Neck: supple. JVP 6-7. Carotids 2+ bilat; no bruits. No lymphadenopathy + goiter Cor: PMI nondisplaced. Regular rate & rhythm. No rubs, gallops or murmurs. Lungs: clear Abdomen: soft, nontender, nondistended. No hepatosplenomegaly. No bruits or masses. Good bowel sounds. Extremities: no cyanosis, clubbing, rash, edema + blanching. scleryldactyly  Neuro: alert & oriented x 3, cranial nerves grossly intact. moves all 4 extremities w/o difficulty. Affect pleasant.   ASSESSMENT & PLAN:  1. Evaluation for pulmonary HTN - Echo today: EF 60-65% grade I DD RV normal. Mild TR.  - RHC in 2 weeks to evaluate for possible WHO group 1 PAH   2. HTN - Continue chlorthalidone 25 mg daily, spiro 50 mg BID, and bystolic 20 mg daily - Add losartan 25 mg daily   3. Scleroderma - Followed by Dr Trudie Reed  4. Pulmonary fibrosis - Followed by Dr Vaughan Browner - High res CT chest scheduled for 5/14   Follow up in 4 months   Georgiana Shore, NP 09/13/17   Patient seen and examined with the  above-signed Advanced Practice Provider and/or Housestaff. I personally reviewed laboratory data, imaging studies and relevant notes. I independently examined the patient and formulated the important aspects of the plan. I have edited the note to reflect any of my changes or salient points. I have personally discussed the plan with the patient and/or family.  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 Currently with NYHA III DOE.  CT at [facility]  suggestive of ILD. PFTs show restriction and abnormal DLCO. Has seen Dr. Vaughan Browner who has ordered hi-res CT. Echo today reviewed personally. EF 6-65% with grade I DD. There is mild TR but RV is normal and no clear evidence of PAH. On exam she has evidence of scleroderma and Raynaud's. No volume overload or evidence of RV strain.   We discussed the risk of ILD and PAH in patients with scleroderma. 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 Vaughan Browner. Echo does not show clear PAH but given how symptomatic she is I think she need a RHC to clearly assess her pulmonary pressures and guide early [facility] therapy as needed. We discussed this and she is willing to proceed.   Glori Bickers, MD  6:04 PM

## 2017-09-13 NOTE — Progress Notes (Signed)
  Echocardiogram 2D Echocardiogram has been performed.  Jennette Dubin 09/13/2017, 3:16 PM

## 2017-09-13 NOTE — H&P (View-Only) (Signed)
Advanced Heart Failure Clinic Note   Referring Physician: Dr Posey Rea PCP: [name], MD PCP-Cardiologist: Mertie Moores, MD   HPI: Karen [name] is a 47 y.o. female [place] at [facility] (with [name]) with a history of Sjogren's syndrome, multinodular goiter, HTN and recently diagnosed scleroderma referred by Dr. Gavin Pound for evaluation of pulmonary HTN in the setting of new diagnosis of scleroderma.  Denies any h/o known cardiac disease. Nonsmoker. Reports that she sets SOB with mild activity and now has been out of work due to exertional fatigue and dyspnea. Gets some LE edema when BP up. No orthopnea or edema. Occasional lightheadedness but no syncope. Occasional brief palipitations Has seen Dr. Vaughan Browner who ordered hi-resolution CT to further evaluate for lung disease. + arthitic pain, Raynaud's and GERD.   She had a hospitalization in [facility] in March 2019 for hypertension.  A CT chest scan at that time showed subtle lower lung findings of atelectasis, groundglass.  She has a right thyroid nodule which was biopsied on 08/15/17 with pathology showing benign findings.  Thyroid function tests are normal.  Echo today: EF 60-65% grade I DD RV normal. Mild TR.   PFTs 07/27/17: FVC 1.7 L, 56% FEV-1 1.5 1L, 62% DLCO 44%   Review of Systems: [y] = yes, [ ]  = no   General: Weight gain [ ] ; Weight loss [ ] ; Anorexia [ ] ; Fatigue Blue.Reese ]; Fever [ ] ; Chills [ ] ; Weakness [ ]   Cardiac: Chest pain/pressure [ ] ; Resting SOB [ ] ; Exertional SOB Blue.Reese ]; Orthopnea [ ] ; Pedal Edema [ y]; Palpitations Blue.Reese ]; Syncope [ ] ; Presyncope [ ] ; Paroxysmal nocturnal dyspnea[ ]   Pulmonary: Cough [ ] ; Wheezing[ ] ; Hemoptysis[ ] ; Sputum [ ] ; Snoring [ ]   GI: Vomiting[ ] ; Dysphagia[ ] ; Melena[ ] ; Hematochezia [ ] ; Heartburn[y ]; Abdominal pain [ ] ; Constipation [ ] ; Diarrhea [ ] ; BRBPR [ ]   GU: Hematuria[ ] ; Dysuria [ ] ; Nocturia[ ]   Vascular: Pain in legs with walking [ ] ; Pain in feet with  lying flat [ ] ; Non-healing sores [ ] ; Stroke [ ] ; TIA [ ] ; Slurred speech [ ] ;  Neuro: Headaches[ ] ; Vertigo[ ] ; Seizures[ ] ; Paresthesias[ ] ;Blurred vision [ ] ; Diplopia [ ] ; Vision changes [ ]   Ortho/Skin: Arthritis [ y]; Joint pain [ y]; Muscle pain [ ] ; Joint swelling [ ] ; Back Pain [ ] ; Rash [ ]   Psych: Depression[ ] ; Anxiety[ ]   Heme: Bleeding problems [ ] ; Clotting disorders [ ] ; Anemia [ ]   Endocrine: Diabetes [ ] ; Thyroid dysfunction[ ]    Past Medical History:  Diagnosis Date  . Hypertension     Current Outpatient Medications  Medication Sig Dispense Refill  . chlorthalidone (HYGROTON) 25 MG tablet daily.    . cyanocobalamin 1000 MCG tablet Take 1,000 mcg daily by mouth.    . Nebivolol HCl (BYSTOLIC) 20 MG TABS Take 20 mg by mouth.    . pantoprazole (PROTONIX) 20 MG tablet Take 1 tablet (20 mg total) by mouth 2 (two) times daily before a meal. 60 tablet 0  . spironolactone (ALDACTONE) 50 MG tablet Take 50 mg by mouth 2 (two) times daily.     . Vitamin D, Ergocalciferol, (DRISDOL) 50000 units CAPS capsule Take 50,000 Units by mouth every 7 (seven) days.     No current facility-administered medications for this encounter.     Allergies  Allergen Reactions  . Lisinopril Hives and Swelling      Social History   Socioeconomic  History  . Marital status: Single    Spouse name: Not on file  . Number of children: Not on file  . Years of education: Not on file  . Highest education level: Not on file  Occupational History  . Not on file  Social Needs  . Financial resource strain: Not on file  . Food insecurity:    Worry: Not on file    Inability: Not on file  . Transportation needs:    Medical: Not on file    Non-medical: Not on file  Tobacco Use  . Smoking status: Never Smoker  . Smokeless tobacco: Never Used  Substance and Sexual Activity  . Alcohol use: No  . Drug use: No  . Sexual activity: Not on file  Lifestyle  . Physical activity:    Days per week: Not  on file    Minutes per session: Not on file  . Stress: Not on file  Relationships  . Social connections:    Talks on phone: Not on file    Gets together: Not on file    Attends religious service: Not on file    Active member of club or organization: Not on file    Attends meetings of clubs or organizations: Not on file    Relationship status: Not on file  . Intimate partner violence:    Fear of current or ex partner: Not on file    Emotionally abused: Not on file    Physically abused: Not on file    Forced sexual activity: Not on file  Other Topics Concern  . Not on file  Social History Narrative  . Not on file      Family History  Problem Relation Age of Onset  . Hypertension Father   . Hypertension Sister   . Hypertension Brother     Vitals:   09/13/17 1530  BP: (!) 142/84  Weight: 135 lb (61.2 kg)     PHYSICAL EXAM: General:  Well appearing. No respiratory difficulty HEENT: normal Neck: supple. JVP 6-7. Carotids 2+ bilat; no bruits. No lymphadenopathy + goiter Cor: PMI nondisplaced. Regular rate & rhythm. No rubs, gallops or murmurs. Lungs: clear Abdomen: soft, nontender, nondistended. No hepatosplenomegaly. No bruits or masses. Good bowel sounds. Extremities: no cyanosis, clubbing, rash, edema + blanching. scleryldactyly  Neuro: alert & oriented x 3, cranial nerves grossly intact. moves all 4 extremities w/o difficulty. Affect pleasant.   ASSESSMENT & PLAN:  1. Evaluation for pulmonary HTN - Echo today: EF 60-65% grade I DD RV normal. Mild TR.  - RHC in 2 weeks to evaluate for possible WHO group 1 PAH   2. HTN - Continue chlorthalidone 25 mg daily, spiro 50 mg BID, and bystolic 20 mg daily - Add losartan 25 mg daily   3. Scleroderma - Followed by Dr Trudie Reed  4. Pulmonary fibrosis - Followed by Dr Vaughan Browner - High res CT chest scheduled for 5/14   Follow up in 4 months   Georgiana Shore, NP 09/13/17   Patient seen and examined with the  above-signed Advanced Practice Provider and/or Housestaff. I personally reviewed laboratory data, imaging studies and relevant notes. I independently examined the patient and formulated the important aspects of the plan. I have edited the note to reflect any of my changes or salient points. I have personally discussed the plan with the patient and/or family.  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 Currently with NYHA III DOE.  CT at [facility]  suggestive of ILD. PFTs show restriction and abnormal DLCO. Has seen Dr. Vaughan Browner who has ordered hi-res CT. Echo today reviewed personally. EF 6-65% with grade I DD. There is mild TR but RV is normal and no clear evidence of PAH. On exam she has evidence of scleroderma and Raynaud's. No volume overload or evidence of RV strain.   We discussed the risk of ILD and PAH in patients with scleroderma. 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 Vaughan Browner. Echo does not show clear PAH but given how symptomatic she is I think she need a RHC to clearly assess her pulmonary pressures and guide early [facility] therapy as needed. We discussed this and she is willing to proceed.   Glori Bickers, MD  6:04 PM

## 2017-09-14 ENCOUNTER — Ambulatory Visit (HOSPITAL_COMMUNITY)
Admission: RE | Admit: 2017-09-14 | Discharge: 2017-09-14 | Disposition: A | Discharge status: Home / Self Care | Payer: 59 | Source: Ambulatory Visit | Attending: Internal Medicine | Admitting: Internal Medicine

## 2017-09-14 DIAGNOSIS — R1310 Dysphagia, unspecified: Secondary | ICD-10-CM | POA: Insufficient documentation

## 2017-09-14 DIAGNOSIS — K224 Dyskinesia of esophagus: Secondary | ICD-10-CM | POA: Diagnosis not present

## 2017-09-17 ENCOUNTER — Telehealth (HOSPITAL_COMMUNITY): Payer: Self-pay | Admitting: *Deleted

## 2017-09-17 ENCOUNTER — Other Ambulatory Visit: Payer: 59 | Admitting: Orthotics

## 2017-09-17 MED ORDER — POTASSIUM CHLORIDE ER 10 MEQ PO TBCR: EXTENDED_RELEASE_TABLET | ORAL | 3 refills | Status: DC

## 2017-09-17 MED FILL — POTASSIUM CL 10 MEQ TAB SA: 10 | 30 days supply | Qty: 125 | Fill #0

## 2017-09-17 NOTE — Addendum Note (Signed)
Addended by: Scarlette Calico on: 09/17/2017 05:07 PM   Modules accepted: Orders

## 2017-09-17 NOTE — Telephone Encounter (Signed)
-----   Message from Jolaine Artist, MD sent at 09/13/2017  5:39 PM EDT ----- Start kcl 40 daily. Take 2 tabs on the first day. Repeat in 1 week.

## 2017-09-17 NOTE — Telephone Encounter (Signed)
Notes recorded by Scarlette Calico, RN on 09/17/2017 at 5:02 PM EDT Pt aware and agreeable, she states she has a hard time swallowing so she will try the 10 meq tabs if she can't get them down she will call us back, will repeat labs 5/23

## 2017-09-18 ENCOUNTER — Ambulatory Visit (INDEPENDENT_AMBULATORY_CARE_PROVIDER_SITE_OTHER)
Admission: RE | Admit: 2017-09-18 | Discharge: 2017-09-18 | Disposition: A | Discharge status: Home / Self Care | Payer: 59 | Source: Ambulatory Visit | Attending: Pulmonary Disease | Admitting: Pulmonary Disease

## 2017-09-18 ENCOUNTER — Encounter: Payer: Self-pay | Admitting: Internal Medicine

## 2017-09-18 DIAGNOSIS — J849 Interstitial pulmonary disease, unspecified: Secondary | ICD-10-CM

## 2017-09-18 DIAGNOSIS — J90 Pleural effusion, not elsewhere classified: Secondary | ICD-10-CM | POA: Diagnosis not present

## 2017-09-21 ENCOUNTER — Ambulatory Visit (INDEPENDENT_AMBULATORY_CARE_PROVIDER_SITE_OTHER): Payer: 59 | Admitting: Pulmonary Disease

## 2017-09-21 ENCOUNTER — Other Ambulatory Visit (INDEPENDENT_AMBULATORY_CARE_PROVIDER_SITE_OTHER): Payer: 59

## 2017-09-21 ENCOUNTER — Other Ambulatory Visit (HOSPITAL_COMMUNITY): Payer: Self-pay

## 2017-09-21 ENCOUNTER — Encounter: Payer: Self-pay | Admitting: Pulmonary Disease

## 2017-09-21 ENCOUNTER — Telehealth: Payer: Self-pay

## 2017-09-21 VITALS — BP 128/72 | HR 85 | Ht 65.0 in | Wt 131.0 lb

## 2017-09-21 DIAGNOSIS — J849 Interstitial pulmonary disease, unspecified: Secondary | ICD-10-CM

## 2017-09-21 LAB — CBC WITH DIFFERENTIAL/PLATELET
BASOS ABS: 0 10*3/uL (ref 0.0–0.1)
BASOS PCT: 0.5 % (ref 0.0–3.0)
EOS ABS: 0.2 10*3/uL (ref 0.0–0.7)
Eosinophils Relative: 2.7 % (ref 0.0–5.0)
HEMATOCRIT: 40.4 % (ref 36.0–46.0)
Hemoglobin: 13.4 g/dL (ref 12.0–15.0)
LYMPHS ABS: 1.8 10*3/uL (ref 0.7–4.0)
Lymphocytes Relative: 21.4 % (ref 12.0–46.0)
MCHC: 33.1 g/dL (ref 30.0–36.0)
MCV: 84.8 fl (ref 78.0–100.0)
MONO ABS: 0.8 10*3/uL (ref 0.1–1.0)
Monocytes Relative: 9.8 % (ref 3.0–12.0)
NEUTROS ABS: 5.4 10*3/uL (ref 1.4–7.7)
NEUTROS PCT: 65.6 % (ref 43.0–77.0)
PLATELETS: 244 10*3/uL (ref 150.0–400.0)
RBC: 4.76 Mil/uL (ref 3.87–5.11)
RDW: 14.5 % (ref 11.5–15.5)
WBC: 8.2 10*3/uL (ref 4.0–10.5)

## 2017-09-21 LAB — COMPREHENSIVE METABOLIC PANEL
ALBUMIN: 4 g/dL (ref 3.5–5.2)
ALK PHOS: 66 U/L (ref 39–117)
ALT: 17 U/L (ref 0–35)
AST: 26 U/L (ref 0–37)
BILIRUBIN TOTAL: 0.7 mg/dL (ref 0.2–1.2)
BUN: 12 mg/dL (ref 6–23)
CALCIUM: 10.4 mg/dL (ref 8.4–10.5)
CHLORIDE: 98 meq/L (ref 96–112)
CO2: 34 mEq/L — ABNORMAL HIGH (ref 19–32)
CREATININE: 0.56 mg/dL (ref 0.40–1.20)
GFR: 147.75 mL/min (ref >60.00)
Glucose, Bld: 95 mg/dL (ref 70–99)
Potassium: 3.1 mEq/L — ABNORMAL LOW (ref 3.5–5.1)
Sodium: 139 mEq/L (ref 135–145)
TOTAL PROTEIN: 8.3 g/dL (ref 6.0–8.3)

## 2017-09-21 MED ORDER — SPIRONOLACTONE 50 MG PO TABS: 50.0000 mg | ORAL_TABLET | Freq: Two times a day (BID) | ORAL | 3 refills | Status: DC

## 2017-09-21 MED FILL — SPIRONOLACTONE 50 MG TAB: 50 | 30 days supply | Qty: 60 | Fill #0

## 2017-09-21 NOTE — Patient Instructions (Addendum)
We will check some blood test today including CBC with differential, complete metabolic panel, serologies for hepatitis B, hepatitis C, interferon, test, G6PD test If these are normal then we will start you on a medication called CellCept for treatment of your lung scarring and inflammation  Follow-up in 1 month.

## 2017-09-21 NOTE — Telephone Encounter (Signed)
spironolactone (ALDACTONE) 50 MG tablet  Medication  Date: 09/21/2017 Department: [place] Ordering/Authorizing: Bensimhon, Shaune Pascal, MD  Order Providers   Prescribing Provider Encounter Provider  Bensimhon, Shaune Pascal, MD Shirley Muscat, RN  Outpatient Medication Detail    Disp Refills Start End   spironolactone (ALDACTONE) 50 MG tablet 60 tablet 3 09/21/2017    Sig - Route: Take 1 tablet (50 mg total) by mouth 2 (two) times daily. - Oral   Sent to pharmacy as: spironolactone (ALDACTONE) 50 MG tablet   E-Prescribing Status: Receipt confirmed by pharmacy (09/21/2017 2:16 PM EDT)   Pharmacy   [place], [place] Encounter  Priority and Order Details

## 2017-09-21 NOTE — Progress Notes (Signed)
Karen [name]    [id number]    Jul 03, 1970  Primary Care Physician:Gosrani, Doristine Johns, MD  Referring Physician: Neale Burly, MD [place], [place] [id number]  Chief complaint: Follow-up for scleroderma ILD  HPI: 47 year old with history of hypertension, headaches.  She has complaints of muscle ache, joint pain, fatigue, Raynaud's syndrome and was evaluated by Dr. Trudie Reed, rheumatology earlier this year.  She has a new diagnosis of scleroderma and has been referred to pulmonary for abnormal PFTs showing restriction in diffusion impairment. She also has been referred to Dr. Haroldine Laws, Cardiology for evaluation of pulmonary hypertension.   She has significant issues with dyspnea and gets short of breath walking 5 to 10 m on flat surface.  She does not have symptoms at rest.  Complains of cough, nonproductive in nature.  Denies any wheezing, fevers, chills.  Denies any lower extremity edema Has acid reflux and is on Protonix.  Reports that sometimes food gets stuck while swallowing.  She had a hospitalization in [facility] in March 2019 for hypertension.  A CT chest scan at that time showed subtle lower lung findings of atelectasis, groundglass.  She has a right thyroid nodule which was biopsied on 08/15/17 with pathology showing benign findings.  Thyroid function tests are normal.  Reviewed note from Dr. Trudie Reed, rheumatology 07/12/2017 Seen for joint pain, Raynaud's, elevated CK, skin thickening Serologies positive for Sjogren's negative,, normal CK and aldolase.  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  No mold. No hot tub, Jacuzzi. She has down comforter.  Smoking history: Never smoker Travel history: Grew up in [place].  Lived in [place] and [place] Relevant family history: No family history of lung disease.    Interim history: She is here for review of CT scan.  Reports that her dyspnea on exertion is unchanged.  She is unable to walk more than a few meters without getting short of breath Denies any cough, sputum production, fevers, chills.  Outpatient Encounter Medications as of 09/21/2017  Medication Sig  . chlorthalidone (HYGROTON) 25 MG tablet Take 25 mg by mouth daily.   . cyanocobalamin 1000 MCG tablet Take 1,000 mcg daily by mouth.  . Nebivolol HCl (BYSTOLIC) 20 MG TABS Take 20 mg by mouth daily.   . pantoprazole (PROTONIX) 20 MG tablet Take 1 tablet (20 mg total) by mouth 2 (two) times daily before a meal.  . potassium chloride (K-DUR) 10 MEQ tablet Take 80 meq first day then take 40 meq daily (Patient taking differently: Take 40 mEq by mouth daily. )  . spironolactone (ALDACTONE) 50 MG tablet Take 50 mg by mouth 2 (two) times daily.   . Vitamin D, Ergocalciferol, (DRISDOL) 50000 units CAPS capsule Take 50,000 Units by mouth every Saturday.    No facility-administered encounter medications on file as of 09/21/2017.     Allergies as of 09/21/2017 - Review Complete 09/21/2017  Allergen Reaction Noted  . Lisinopril Hives and Swelling 08/13/2015    Past Medical History:  Diagnosis Date  . Hypertension     No past  surgical history on file.  Family History  Problem Relation Age of Onset  . Hypertension Father   . Hypertension Sister   . Hypertension Brother     Social History   Socioeconomic History  . Marital status: Single    Spouse name: Not on file  . Number of children: Not on file  . Years of education: Not on file  . Highest education  level: Not on file  Occupational History  . Not on file  Social Needs  . Financial resource strain: Not on file  . Food insecurity:    Worry: Not on file    Inability: Not on file  . Transportation needs:    Medical: Not on file    Non-medical: Not on file  Tobacco Use  . Smoking status: Never Smoker  . Smokeless tobacco: Never Used  Substance and Sexual Activity  . Alcohol use: No  . Drug use: No  . Sexual activity: Not on file  Lifestyle  . Physical activity:    Days per week: Not on file    Minutes per session: Not on file  . Stress: Not on file  Relationships  . Social connections:    Talks on phone: Not on file    Gets together: Not on file    Attends religious service: Not on file    Active member of club or organization: Not on file    Attends meetings of clubs or organizations: Not on file    Relationship status: Not on file  . Intimate partner violence:    Fear of current or ex partner: Not on file    Emotionally abused: Not on file    Physically abused: Not on file    Forced sexual activity: Not on file  Other Topics Concern  . Not on file  Social History Narrative  . Not on file   Review of systems: Review of Systems  Constitutional: Negative for fever and chills.  HENT: Negative.   Eyes: Negative for blurred vision.  Respiratory: as per HPI  Cardiovascular: Negative for chest pain and palpitations.  Gastrointestinal: Negative for vomiting, diarrhea, blood per rectum. Genitourinary: Negative for dysuria, urgency, frequency and hematuria.  Musculoskeletal: Negative for myalgias, back pain and joint pain.  Skin: Negative for itching and rash.  Neurological: Negative for dizziness, tremors, focal weakness, seizures and loss of consciousness.  Endo/Heme/Allergies: Negative for environmental allergies.  Psychiatric/Behavioral: Negative for depression, suicidal ideas and hallucinations.  All other systems reviewed and are negative.  Physical Exam: Blood  pressure 112/78, pulse (!) 103, height 5\' 5"  (1.651 m), weight 129 lb (58.5 kg), SpO2 98 %. Gen:      No acute distress HEENT:  EOMI, sclera anicteric Neck:     No masses; no thyromegaly Lungs:    Clear to auscultation bilaterally; normal respiratory effort CV:         Regular rate and rhythm; no murmurs Abd:      + bowel sounds; soft, non-tender; no palpable masses, no distension Ext:    No edema; adequate peripheral perfusion Skin:      Warm and dry; no rash Neuro: alert and oriented x 3 Psych: normal mood and affect  Data Reviewed: FENO 09/06/2017-unable to complete  PFTs 07/27/2017 FVC 1.65 (54%), FEV1 1.53 (2%], F/F 93, TLC 50, DLCO 44%, DLCO/VA 131% Severe restriction and diffusion impairment.  CT chest, [facility] 07/24/2017 Dependent atelectasis, subtle groundglass opacities at the base.  2.5 cm right thyroid nodule.  I have  reviewed the images personally  CT high-resolution 09/18/2017- patchy reticular groundglass opacities, reticulation with subpleural sparing.  Mild traction bronchiectasis.  No honeycombing.  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 Although the ILD appears mild there is significant reduction in her PFTs with restriction and diffusion impairment  Discussed with Dr. Trudie Reed.  We will initiate CellCept after checking baseline labs Send blood test for CBC differential, metabolic panel, hepatic panel, hepatitis B, C, interferon screening Schedule 6-minute walk test  Esophageal dysfunction GERD Continue on Protonix 20 mg twice daily  Evaluation for pulmonary hypertension She has been seen by Dr. Haroldine Laws, Cardiology.  She is scheduled for a right heart cath next week for evaluation of pulmonary hypertension.  Plan/Recommendations: - Check baseline blood tests.  If normal  then start CellCept - 6MWT - Right heart cath next week  Marshell Garfinkel MD [facility] 09/21/2017, 11:20 AM

## 2017-09-21 NOTE — Telephone Encounter (Signed)
Dose was changed in hospital per Dr. Acie Fredrickson.  Is being managed by Dr. Haroldine Laws. OK to refill at 50 mg BID per Dr. Acie Fredrickson.

## 2017-09-24 ENCOUNTER — Ambulatory Visit: Payer: 59 | Admitting: Orthotics

## 2017-09-24 ENCOUNTER — Telehealth: Payer: Self-pay | Admitting: Pulmonary Disease

## 2017-09-24 ENCOUNTER — Other Ambulatory Visit: Payer: 59

## 2017-09-24 ENCOUNTER — Other Ambulatory Visit: Payer: Self-pay | Admitting: Pulmonary Disease

## 2017-09-24 DIAGNOSIS — J849 Interstitial pulmonary disease, unspecified: Secondary | ICD-10-CM

## 2017-09-24 DIAGNOSIS — M779 Enthesopathy, unspecified: Secondary | ICD-10-CM

## 2017-09-24 LAB — HEPATITIS B CORE ANTIBODY, TOTAL: Hep B Core Total Ab: NONREACTIVE

## 2017-09-24 LAB — HEPATITIS C ANTIBODY
Hepatitis C Ab: NONREACTIVE
SIGNAL TO CUT-OFF: 0.03 (ref <1.00)

## 2017-09-24 LAB — GLUCOSE 6 PHOSPHATE DEHYDROGENASE

## 2017-09-24 LAB — HEPATITIS B SURFACE ANTIGEN: Hepatitis B Surface Ag: NONREACTIVE

## 2017-09-24 LAB — TIQ-NTM

## 2017-09-24 LAB — HEPATITIS B SURFACE ANTIBODY,QUALITATIVE: Hep B S Ab: NONREACTIVE

## 2017-09-24 MED FILL — CHLORTHALIDONE 25 MG TAB: 25 | 30 days supply | Qty: 30 | Fill #0

## 2017-09-24 NOTE — Progress Notes (Signed)
Patient came in today to pick up custom made foot orthotics.  The goals were accomplished and the patient reported no dissatisfaction with said orthotics.  Patient was advised of breakin period and how to report any issues. 

## 2017-09-24 NOTE — Telephone Encounter (Signed)
Please send to Dr. Haroldine Laws for refill

## 2017-09-24 NOTE — Telephone Encounter (Signed)
Parking place card has been completed and signed by RB, as Dr. Vaughan Browner is currently out of the office. Nothing further is needed.

## 2017-09-25 ENCOUNTER — Other Ambulatory Visit: Payer: 59

## 2017-09-25 DIAGNOSIS — J849 Interstitial pulmonary disease, unspecified: Secondary | ICD-10-CM

## 2017-09-26 ENCOUNTER — Other Ambulatory Visit (HOSPITAL_COMMUNITY): Payer: Self-pay | Admitting: *Deleted

## 2017-09-26 LAB — ZINC: Zinc: 59 ug/dL — ABNORMAL LOW (ref 60–130)

## 2017-09-26 LAB — GLUCOSE 6 PHOSPHATE DEHYDROGENASE: G-6PDH: 12.8 U/g Hgb (ref 7.0–20.5)

## 2017-09-26 MED ORDER — SPIRONOLACTONE 50 MG PO TABS: 50.0000 mg | ORAL_TABLET | Freq: Two times a day (BID) | ORAL | 3 refills | Status: DC

## 2017-09-27 ENCOUNTER — Ambulatory Visit (HOSPITAL_COMMUNITY)
Admission: RE | Admit: 2017-09-27 | Discharge: 2017-09-27 | Disposition: A | Discharge status: Home / Self Care | Payer: 59 | Source: Ambulatory Visit | Attending: Internal Medicine | Admitting: Internal Medicine

## 2017-09-27 ENCOUNTER — Ambulatory Visit (HOSPITAL_COMMUNITY)
Admission: RE | Disposition: A | Discharge status: Home / Self Care | Payer: Self-pay | Source: Ambulatory Visit | Attending: Internal Medicine

## 2017-09-27 DIAGNOSIS — I272 Pulmonary hypertension, unspecified: Secondary | ICD-10-CM | POA: Insufficient documentation

## 2017-09-27 DIAGNOSIS — J8410 Pulmonary fibrosis, unspecified: Secondary | ICD-10-CM | POA: Diagnosis not present

## 2017-09-27 DIAGNOSIS — I10 Essential (primary) hypertension: Secondary | ICD-10-CM | POA: Insufficient documentation

## 2017-09-27 DIAGNOSIS — M349 Systemic sclerosis, unspecified: Secondary | ICD-10-CM | POA: Diagnosis not present

## 2017-09-27 DIAGNOSIS — Z8249 Family history of ischemic heart disease and other diseases of the circulatory system: Secondary | ICD-10-CM | POA: Insufficient documentation

## 2017-09-27 DIAGNOSIS — E042 Nontoxic multinodular goiter: Secondary | ICD-10-CM | POA: Diagnosis not present

## 2017-09-27 DIAGNOSIS — M3500 Sicca syndrome, unspecified: Secondary | ICD-10-CM | POA: Insufficient documentation

## 2017-09-27 HISTORY — PX: RIGHT HEART CATH: CATH118263

## 2017-09-27 LAB — BASIC METABOLIC PANEL
Anion gap: 11 (ref 5–15)
BUN: 10 mg/dL (ref 6–20)
CHLORIDE: 100 mmol/L — AB (ref 101–111)
CO2: 30 mmol/L (ref 22–32)
Calcium: 10.6 mg/dL — ABNORMAL HIGH (ref 8.9–10.3)
Creatinine, Ser: 0.58 mg/dL (ref 0.44–1.00)
GFR calc non Af Amer: >60 mL/min (ref >60)
Glucose, Bld: 101 mg/dL — ABNORMAL HIGH (ref 65–99)
POTASSIUM: 2.5 mmol/L — AB (ref 3.5–5.1)
SODIUM: 141 mmol/L (ref 135–145)

## 2017-09-27 LAB — POCT I-STAT 3, VENOUS BLOOD GAS (G3P V)
Acid-Base Excess: 5 mmol/L — ABNORMAL HIGH (ref 0.0–2.0)
Acid-Base Excess: 5 mmol/L — ABNORMAL HIGH (ref 0.0–2.0)
BICARBONATE: 29.3 mmol/L — AB (ref 20.0–28.0)
BICARBONATE: 29.4 mmol/L — AB (ref 20.0–28.0)
O2 Saturation: 74 %
O2 Saturation: 75 %
PCO2 VEN: 41.2 mmHg — AB (ref 44.0–60.0)
PCO2 VEN: 42 mmHg — AB (ref 44.0–60.0)
PH VEN: 7.452 — AB (ref 7.250–7.430)
PH VEN: 7.462 — AB (ref 7.250–7.430)
PO2 VEN: 37 mmHg (ref 32.0–45.0)
PO2 VEN: 38 mmHg (ref 32.0–45.0)
TCO2: 31 mmol/L (ref 22–32)
TCO2: 31 mmol/L (ref 22–32)

## 2017-09-27 LAB — PREGNANCY, URINE: PREG TEST UR: NEGATIVE

## 2017-09-27 SURGERY — RIGHT HEART CATH
Anesthesia: LOCAL

## 2017-09-27 MED ORDER — SODIUM CHLORIDE 0.9 % IV SOLN: INTRAVENOUS | Status: DC

## 2017-09-27 MED ORDER — HEPARIN (PORCINE) IN NACL 1000-0.9 UT/500ML-% IV SOLN
INTRAVENOUS | Status: AC
  Filled 2017-09-27: qty 500

## 2017-09-27 MED ORDER — ACETAMINOPHEN 325 MG PO TABS: 650.0000 mg | ORAL_TABLET | ORAL | Status: DC | PRN

## 2017-09-27 MED ORDER — HEPARIN (PORCINE) IN NACL 2-0.9 UNITS/ML
INTRAMUSCULAR | Status: AC | PRN
  Administered 2017-09-27: 500 mL

## 2017-09-27 MED ORDER — LIDOCAINE HCL (PF) 1 % IJ SOLN
INTRAMUSCULAR | Status: AC
  Filled 2017-09-27: qty 30

## 2017-09-27 MED ORDER — LIDOCAINE HCL (PF) 1 % IJ SOLN
INTRAMUSCULAR | Status: DC | PRN
  Administered 2017-09-27: 1 mL

## 2017-09-27 MED ORDER — SODIUM CHLORIDE 0.9% FLUSH: 3.0000 mL | Freq: Two times a day (BID) | INTRAVENOUS | Status: DC

## 2017-09-27 MED ORDER — POTASSIUM CHLORIDE CRYS ER 20 MEQ PO TBCR
EXTENDED_RELEASE_TABLET | ORAL | Status: AC
  Administered 2017-09-27: 40 meq via ORAL
  Filled 2017-09-27: qty 2

## 2017-09-27 MED ORDER — POTASSIUM CHLORIDE CRYS ER 20 MEQ PO TBCR
40.0000 meq | EXTENDED_RELEASE_TABLET | ORAL | Status: DC
  Administered 2017-09-27: 40 meq via ORAL

## 2017-09-27 MED ORDER — ASPIRIN 81 MG PO CHEW
81.0000 mg | CHEWABLE_TABLET | ORAL | Status: AC
  Administered 2017-09-27: 81 mg via ORAL

## 2017-09-27 MED ORDER — POTASSIUM CHLORIDE 20 MEQ/15ML (10%) PO SOLN: 20.0000 meq | Freq: Every day | ORAL | 0 refills | Status: DC

## 2017-09-27 MED ORDER — ASPIRIN 81 MG PO CHEW
CHEWABLE_TABLET | ORAL | Status: AC
  Administered 2017-09-27: 81 mg via ORAL
  Filled 2017-09-27: qty 1

## 2017-09-27 MED ORDER — SODIUM CHLORIDE 0.9 % IV SOLN: 250.0000 mL | INTRAVENOUS | Status: DC | PRN

## 2017-09-27 MED ORDER — ONDANSETRON HCL 4 MG/2ML IJ SOLN
4.0000 mg | Freq: Four times a day (QID) | INTRAMUSCULAR | Status: DC | PRN
Start: 2017-09-27 — End: 2017-09-27

## 2017-09-27 MED ORDER — SODIUM CHLORIDE 0.9% FLUSH: 3.0000 mL | INTRAVENOUS | Status: DC | PRN

## 2017-09-27 MED ORDER — POTASSIUM CHLORIDE 20 MEQ/15ML (10%) PO SOLN
ORAL | Status: AC
  Filled 2017-09-27: qty 30

## 2017-09-27 MED ORDER — POTASSIUM CHLORIDE 20 MEQ/15ML (10%) PO SOLN
ORAL | Status: DC | PRN
  Administered 2017-09-27: 40 meq via ORAL

## 2017-09-27 MED FILL — POTASSIUM CHLORIDE 20 MEQ/1: 20 MEQ/15ML | 60 days supply | Qty: 900 | Fill #0

## 2017-09-27 SURGICAL SUPPLY — 8 items
CATH BALLN WEDGE 5F 110CM (CATHETERS) ×2 IMPLANT
GUIDEWIRE .025 260CM (WIRE) ×2 IMPLANT
PACK CARDIAC CATHETERIZATION (CUSTOM PROCEDURE TRAY) ×2 IMPLANT
SHEATH RAIN 4/5FR (SHEATH) ×2 IMPLANT
TRANSDUCER W/STOPCOCK (MISCELLANEOUS) ×2 IMPLANT
TUBING ART PRESS 72  MALE/FEM (TUBING) ×1
TUBING ART PRESS 72 MALE/FEM (TUBING) ×1 IMPLANT
TUBING CIL FLEX 10 FLL-RA (TUBING) ×2 IMPLANT

## 2017-09-27 NOTE — Progress Notes (Signed)
**Note Deshae Dickison-Identified via Obfuscation** CRITICAL VALUE ALERT  Critical Value:  K+ 2.5  Date & Time Notied:  09/27/17 at 1044  Provider Notified: Rennis Harding, RN/Cath lab  Orders Received/Actions taken: Patient given 40 mEq of potassium chloride SA at 1103. Patient scheduled for additional dose within 2 hours after first administration.

## 2017-09-27 NOTE — Interval H&P Note (Signed)
History and Physical Interval Note:  09/27/2017 1:24 PM  Karen [name]  has presented today for surgery, with the diagnosis of pulmonary hypertension  The various methods of treatment have been discussed with the patient and family. After consideration of risks, benefits and other options for treatment, the patient has consented to  Procedure(s): RIGHT HEART CATH (N/A) as a surgical intervention .  The patient's history has been reviewed, patient examined, no change in status, stable for surgery.  I have reviewed the patient's chart and labs.  Questions were answered to the patient's satisfaction.     Daniel Bensimhon

## 2017-09-27 NOTE — Discharge Instructions (Signed)
Venogram °A venogram, or venography, is a procedure that uses an X-ray and dye (contrast) to examine how well the veins work and how blood flows through them. Contrast helps the veins show up on X-rays. A venogram may be done: °· To evaluate vein abnormalities. °· To identify clots within veins, such as deep vein thrombosis (DVT). °· To map out the veins that might be needed for another procedure. ° °Tell a health care provider about: °· Any allergies you have, especially to medicines, shellfish, iodine, and contrast. °· All medicines you are taking, including vitamins, herbs, eye drops, creams, and over-the-counter medicines. °· Any problems you or family members have had with anesthetic medicines. °· Any blood disorders you have. °· Any surgeries you have had and any complications that occurred. °· Any medical conditions you have. °· Whether you are pregnant, may be pregnant, or are breastfeeding. °· Any history of smoking or tobacco use. °What are the risks? °Generally, this is a safe procedure. However, problems may occur, including: °· Infection. °· Bleeding. °· Blood clots. °· Allergic reaction to medicines or contrast. °· Damage to other structures or organs. °· Kidney problems. °· X-ray exposure. Being exposed to too much radiation over a lifetime can increase the risk of cancer. The risk of this is small. ° °What happens before the procedure? °Medicines °Ask your health care provider about: °· Changing or stopping your normal medicines. This is especially important if you take diabetes medicines or blood thinners. °· Taking medicines such as aspirin and ibuprofen. These medicines can thin your blood. Do not take these medicines before your procedure if your doctor instructs you not to. ° °General instructions °· You may have blood tests to check how well your kidneys and liver are working and how well your blood can clot. °· Plan to have someone take you home from the hospital or clinic. °· Follow  instructions from your health care provider about eating and drinking. °What happens during the procedure? °· An IV will be inserted into one of your veins. °· You may be given a medicine to help you relax (sedative). °· You will lie down on an X-ray table. The table may be tilted in different directions during the procedure to help the contrast move throughout your body. Safety straps will keep you secure if the table is tilted. °· If veins in your arm or leg will be examined, a band may be wrapped around that arm or leg to keep the veins full of blood. This may cause your arm or leg to feel numb. °· The contrast will be injected into your IV tube. You may notice a hot, flushed feeling as it moves throughout your body. You may also notice a metallic taste in your mouth. Both of these sensations will go away after the test is complete. °· You may be asked to lie in different positions or place your legs or arms in different positions. °· At the end of the procedure, you may be given IV fluids to help wash (flush) the contrast out of your veins. °· The IV tube will be removed, and pressure will be applied to the IV site to prevent bleeding. A bandage (dressing) may be applied to the IV site. °What happens after the procedure? °· Your blood pressure, heart rate, breathing rate, and blood oxygen level will be monitored until the medicines you were given have worn off. °· You may be given something to eat and drink. °· You will be instructed   to drink a lot of fluids for the rest of the day and the next day. This helps to help flush the contrast out of your body. °· If you were given a sedative, do not drive for 24 hours or until your health care provider approves. °Summary °· A venogram, or venography, is a procedure that uses an X-ray and dye (contrast) to examine how well the veins work and how blood flows through them. Contrast is a dye that helps the veins show up on X-rays. °· An IV tube will be inserted into one  of your veins in order to inject the contrast. °· During the exam, you will lie on an X-ray table. The table may be tilted in different directions during the procedure to help the contrast move throughout your body. Safety straps will keep you secure. °· After the procedure, you will need to drink a lot of fluids to help wash (flush) the contrast out of your body. °This information is not intended to replace advice given to you by your health care provider. Make sure you discuss any questions you have with your health care provider. °Document Released: 04/12/2009 Document Revised: 03/18/2016 Document Reviewed: 03/18/2016 °Elsevier Interactive Patient Education © 2017 Elsevier Inc. ° °

## 2017-09-28 ENCOUNTER — Other Ambulatory Visit: Payer: Self-pay | Admitting: Pulmonary Disease

## 2017-09-28 ENCOUNTER — Telehealth: Payer: Self-pay | Admitting: Pulmonary Disease

## 2017-09-28 ENCOUNTER — Encounter (HOSPITAL_COMMUNITY): Payer: Self-pay | Admitting: Internal Medicine

## 2017-09-28 DIAGNOSIS — J849 Interstitial pulmonary disease, unspecified: Secondary | ICD-10-CM

## 2017-09-28 DIAGNOSIS — R0600 Dyspnea, unspecified: Secondary | ICD-10-CM

## 2017-09-28 MED FILL — Lidocaine HCl Local Preservative Free (PF) Inj 1%: INTRAMUSCULAR | Qty: 30 | Status: AC

## 2017-09-28 NOTE — Telephone Encounter (Signed)
Thanks for the update.  Please add a BMET, Phosphorous and magnesium to the labs she is getting done on Tue 5/28.

## 2017-09-28 NOTE — Telephone Encounter (Signed)
BMET, Phosphorus, and Magnesium added to upcoming labs 09/28/17.

## 2017-09-28 NOTE — Progress Notes (Signed)
See phone note from 09/28/17.

## 2017-09-28 NOTE — Telephone Encounter (Signed)
Spoke with Dr. Vaughan Browner and was advised to contact pt regarding recent lab results. Per Dr. Vaughan Browner - pt's potassium is low and she will need to speak with PCP or cardiologist about potassium wasting diuretics. Pt also needs Quantiferon Gold test ordered and pt will need to come by asap to have lab drawn to get pt started on CellCept therapy.   Called [facility] and was advised they are not able to use existing serum to run a Quantiferon Gold. G6PD was ordered along with the other tests.    Called and spoke to pt. Informed her of the recs per Dr. Vaughan Browner. Pt verbalized understanding and states he was started on K+ 28mEq daily. Pt states she also received PO potassium while at hospital on 5.23.2019. Pt states she does not told a time to have the BMET redrawn to follow up on K+ level. Pt also states she does not want to come to the lab today and wants to wait until Tuesday 5/28 to have [place] collected. Order placed. Pt verbalized understanding and denied any further questions or concerns at this time.    Will forward to Dr. Vaughan Browner as Juluis Rainier.

## 2017-10-02 ENCOUNTER — Other Ambulatory Visit (INDEPENDENT_AMBULATORY_CARE_PROVIDER_SITE_OTHER): Payer: 59

## 2017-10-02 DIAGNOSIS — R0600 Dyspnea, unspecified: Secondary | ICD-10-CM | POA: Diagnosis not present

## 2017-10-02 DIAGNOSIS — J849 Interstitial pulmonary disease, unspecified: Secondary | ICD-10-CM | POA: Diagnosis not present

## 2017-10-02 LAB — MAGNESIUM: MAGNESIUM: 1.7 mg/dL (ref 1.5–2.5)

## 2017-10-02 LAB — BASIC METABOLIC PANEL
BUN: 12 mg/dL (ref 6–23)
CO2: 31 mEq/L (ref 19–32)
Calcium: 10.7 mg/dL — ABNORMAL HIGH (ref 8.4–10.5)
Chloride: 99 mEq/L (ref 96–112)
Creatinine, Ser: 0.59 mg/dL (ref 0.40–1.20)
GFR: 139.09 mL/min (ref >60.00)
GLUCOSE: 97 mg/dL (ref 70–99)
POTASSIUM: 3.5 meq/L (ref 3.5–5.1)
SODIUM: 137 meq/L (ref 135–145)

## 2017-10-02 LAB — PHOSPHORUS: Phosphorus: 2.4 mg/dL (ref 2.3–4.6)

## 2017-10-04 ENCOUNTER — Other Ambulatory Visit: Payer: Self-pay

## 2017-10-04 DIAGNOSIS — J849 Interstitial pulmonary disease, unspecified: Secondary | ICD-10-CM

## 2017-10-04 LAB — QUANTIFERON-TB GOLD PLUS
Mitogen-NIL: >10 IU/mL
NIL: 0.04 IU/mL
QuantiFERON-TB Gold Plus: NEGATIVE
TB2-NIL: <0 IU/mL

## 2017-10-04 MED ORDER — MYCOPHENOLATE MOFETIL 500 MG PO TABS: 500.0000 mg | ORAL_TABLET | Freq: Two times a day (BID) | ORAL | 5 refills | Status: DC

## 2017-10-04 MED FILL — MYCOPHENOLATE 500 MG TABLET: 500 | 30 days supply | Qty: 60 | Fill #0

## 2017-10-18 DIAGNOSIS — M791 Myalgia, unspecified site: Secondary | ICD-10-CM | POA: Diagnosis not present

## 2017-10-18 DIAGNOSIS — J849 Interstitial pulmonary disease, unspecified: Secondary | ICD-10-CM | POA: Diagnosis not present

## 2017-10-18 DIAGNOSIS — Z6821 Body mass index (BMI) 21.0-21.9, adult: Secondary | ICD-10-CM | POA: Diagnosis not present

## 2017-10-18 DIAGNOSIS — I7300 Raynaud's syndrome without gangrene: Secondary | ICD-10-CM | POA: Diagnosis not present

## 2017-10-18 DIAGNOSIS — R0602 Shortness of breath: Secondary | ICD-10-CM | POA: Diagnosis not present

## 2017-10-18 DIAGNOSIS — M349 Systemic sclerosis, unspecified: Secondary | ICD-10-CM | POA: Diagnosis not present

## 2017-10-22 ENCOUNTER — Ambulatory Visit: Payer: 59

## 2017-10-22 ENCOUNTER — Ambulatory Visit: Payer: 59 | Admitting: Pulmonary Disease

## 2017-10-23 ENCOUNTER — Other Ambulatory Visit (INDEPENDENT_AMBULATORY_CARE_PROVIDER_SITE_OTHER): Payer: 59

## 2017-10-23 ENCOUNTER — Telehealth: Payer: Self-pay | Admitting: Pulmonary Disease

## 2017-10-23 ENCOUNTER — Ambulatory Visit (INDEPENDENT_AMBULATORY_CARE_PROVIDER_SITE_OTHER): Payer: 59 | Admitting: Pulmonary Disease

## 2017-10-23 ENCOUNTER — Ambulatory Visit (INDEPENDENT_AMBULATORY_CARE_PROVIDER_SITE_OTHER): Payer: 59 | Admitting: *Deleted

## 2017-10-23 VITALS — BP 114/70 | HR 76 | Ht 65.0 in | Wt 131.0 lb

## 2017-10-23 DIAGNOSIS — J849 Interstitial pulmonary disease, unspecified: Secondary | ICD-10-CM | POA: Diagnosis not present

## 2017-10-23 DIAGNOSIS — M349 Systemic sclerosis, unspecified: Secondary | ICD-10-CM

## 2017-10-23 LAB — BASIC METABOLIC PANEL
BUN: 15 mg/dL (ref 6–23)
CALCIUM: 10.6 mg/dL — AB (ref 8.4–10.5)
CO2: 28 mEq/L (ref 19–32)
Chloride: 102 mEq/L (ref 96–112)
Creatinine, Ser: 0.59 mg/dL (ref 0.40–1.20)
GFR: 139.06 mL/min (ref >60.00)
Glucose, Bld: 98 mg/dL (ref 70–99)
Potassium: 4 mEq/L (ref 3.5–5.1)
SODIUM: 137 meq/L (ref 135–145)

## 2017-10-23 LAB — CBC WITH DIFFERENTIAL/PLATELET
BASOS ABS: 0.1 10*3/uL (ref 0.0–0.1)
Basophils Relative: 0.6 % (ref 0.0–3.0)
Eosinophils Absolute: 0.2 10*3/uL (ref 0.0–0.7)
Eosinophils Relative: 2.1 % (ref 0.0–5.0)
HEMATOCRIT: 38.6 % (ref 36.0–46.0)
Hemoglobin: 12.9 g/dL (ref 12.0–15.0)
LYMPHS PCT: 19.2 % (ref 12.0–46.0)
Lymphs Abs: 1.7 10*3/uL (ref 0.7–4.0)
MCHC: 33.3 g/dL (ref 30.0–36.0)
MCV: 85.4 fl (ref 78.0–100.0)
MONOS PCT: 10 % (ref 3.0–12.0)
Monocytes Absolute: 0.9 10*3/uL (ref 0.1–1.0)
Neutro Abs: 6.2 10*3/uL (ref 1.4–7.7)
Neutrophils Relative %: 68.1 % (ref 43.0–77.0)
Platelets: 228 10*3/uL (ref 150.0–400.0)
RBC: 4.52 Mil/uL (ref 3.87–5.11)
RDW: 14.4 % (ref 11.5–15.5)
WBC: 9.1 10*3/uL (ref 4.0–10.5)

## 2017-10-23 MED ORDER — SULFAMETHOXAZOLE-TRIMETHOPRIM 800-160 MG PO TABS: ORAL_TABLET | ORAL | 3 refills | Status: DC

## 2017-10-23 MED ORDER — MYCOPHENOLATE MOFETIL 500 MG PO TABS: 1000.0000 mg | ORAL_TABLET | Freq: Two times a day (BID) | ORAL | 5 refills | Status: DC

## 2017-10-23 MED ORDER — SULFAMETHOXAZOLE-TRIMETHOPRIM 800-160 MG PO TABS: 1.0000 | ORAL_TABLET | Freq: Once | ORAL | 3 refills | Status: DC

## 2017-10-23 MED FILL — SULFAMETHOXAZOLE-TMP DS TAB: 800-160 | 30 days supply | Qty: 30 | Fill #0

## 2017-10-23 MED FILL — MYCOPHENOLATE 500 MG TABLET: 500 | 30 days supply | Qty: 120 | Fill #0

## 2017-10-23 NOTE — Patient Instructions (Signed)
Increase the CellCept to 2 tablets twice daily Please take this medication with food We will start Bactrim 1 double strength tablet daily for prophylaxis Follow-up in 2 to 4 weeks with repeat CBC, comprehensive metabolic panel, spirometry, diffusion capacity.

## 2017-10-23 NOTE — Progress Notes (Signed)
Karen [name]    [id number]    [birth year]  Primary Care Physician:Gosrani, Doristine Johns, MD  Referring Physician: [name], MD [address] [place], [place] [address]  Chief complaint: Follow-up for scleroderma ILD  HPI: 47 year old with history of hypertension, headaches.  She has complaints of muscle ache, joint pain, fatigue, Raynaud's syndrome and was evaluated by Dr. Trudie Reed, rheumatology earlier this year.  She has a new diagnosis of scleroderma and has been referred to pulmonary for abnormal PFTs showing restriction in diffusion impairment. She also has been referred to Dr. Haroldine Laws, Cardiology for evaluation of pulmonary hypertension.   She has significant issues with dyspnea and gets short of breath walking 5 to 10 m on flat surface.  She does not have symptoms at rest.  Complains of cough, nonproductive in nature.  Denies any wheezing, fevers, chills.  Denies any lower extremity edema Has acid reflux and is on Protonix.  Reports that sometimes food gets stuck while swallowing.  She had a hospitalization in [facility] in March 2019 for hypertension.  A CT chest scan at that time showed subtle lower lung findings of atelectasis, groundglass.  She has a right thyroid nodule which was biopsied on 08/15/17 with pathology showing benign findings.  Thyroid function tests are normal.  Reviewed note from Dr. Trudie Reed, rheumatology 07/12/2017 Seen for joint pain, Raynaud's, elevated CK, skin thickening Serologies positive for Sjogren's negative,, normal CK and aldolase.  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  No mold. No hot tub, Jacuzzi. She has down comforter.  Smoking history: Never smoker Travel history: Grew up in [place].  Lived in [place] and [place] Relevant family history: No family history of lung disease.    Interim history: Underwent catheterization by Dr. Haroldine Laws with no evidence of pulmonary hypertension Started on CellCept.  Returns to clinic for follow-up.  States that dyspnea is unchanged continues to have significant symptoms of fatigue, shortness of breath on exertion.  Outpatient Encounter Medications as of 10/23/2017  Medication Sig  . chlorthalidone (HYGROTON) 25 MG tablet Take 25 mg by mouth daily.   . cyanocobalamin 1000 MCG tablet Take 1,000 mcg daily by mouth.  . mycophenolate (CELLCEPT) 500 MG tablet Take 1 tablet (500 mg total) by mouth 2 (two) times daily.  . Nebivolol HCl (BYSTOLIC) 20 MG TABS Take 20 mg by mouth daily.   . pantoprazole (PROTONIX) 20 MG tablet Take 1 tablet (20 mg total) by mouth 2 (two) times daily before a meal.  . potassium chloride (K-DUR) 10 MEQ tablet Take 80 meq first day then take 40 meq daily (Patient taking differently: Take 40 mEq by mouth daily. )  . potassium chloride 20 MEQ/15ML (10%) SOLN Take 15 mLs (20 mEq total) by mouth daily.  Marland Kitchen spironolactone (ALDACTONE) 50 MG tablet Take 1 tablet (50 mg total) by mouth 2 (two) times daily.  . Vitamin D, Ergocalciferol, (DRISDOL) 50000 units CAPS capsule Take 50,000 Units by mouth every Saturday.    No facility-administered encounter medications on file as of 10/23/2017.     Allergies  as of 10/23/2017 - Review Complete 10/23/2017  Allergen Reaction Noted  . Lisinopril Hives and Swelling 08/13/2015    Past Medical History:  Diagnosis Date  . Hypertension     Past Surgical History:  Procedure Laterality Date  . RIGHT HEART CATH N/A 09/27/2017   Procedure: RIGHT  HEART CATH;  Surgeon: Jolaine Artist, MD;  Location: [facility];  Service: Cardiovascular;  Laterality: N/A;    Family History  Problem Relation Age of Onset  . Hypertension Father   . Hypertension Sister   . Hypertension Brother     Social History   Socioeconomic History  . Marital status: Single    Spouse name: Not on file  . Number of children: Not on file  . Years of education: Not on file  . Highest education level: Not on file  Occupational History  . Not on file  Social Needs  . Financial resource strain: Not on file  . Food insecurity:    Worry: Not on file    Inability: Not on file  . Transportation needs:    Medical: Not on file    Non-medical: Not on file  Tobacco Use  . Smoking status: Never Smoker  . Smokeless tobacco: Never Used  Substance and Sexual Activity  . Alcohol use: No  . Drug use: No  . Sexual activity: Not on file  Lifestyle  . Physical activity:    Days per week: Not on file    Minutes per session: Not on file  . Stress: Not on file  Relationships  . Social connections:    Talks on phone: Not on file    Gets together: Not on file    Attends religious service: Not on file    Active member of club or organization: Not on file    Attends meetings of clubs or organizations: Not on file    Relationship status: Not on file  . Intimate partner violence:    Fear of current or ex partner: Not on file    Emotionally abused: Not on file    Physically abused: Not on file    Forced sexual activity: Not on file  Other Topics Concern  . Not on file  Social History Narrative  . Not on file   Review of systems: Review of Systems  Constitutional: Negative for fever and chills.  HENT: Negative.   Eyes: Negative for blurred vision.  Respiratory: as per HPI  Cardiovascular: Negative for chest pain and palpitations.  Gastrointestinal: Negative for vomiting, diarrhea, blood per rectum. Genitourinary: Negative for dysuria, urgency,  frequency and hematuria.  Musculoskeletal: Negative for myalgias, back pain and joint pain.  Skin: Negative for itching and rash.  Neurological: Negative for dizziness, tremors, focal weakness, seizures and loss of consciousness.  Endo/Heme/Allergies: Negative for environmental allergies.  Psychiatric/Behavioral: Negative for depression, suicidal ideas and hallucinations.  All other systems reviewed and are negative.  Physical Exam: Blood pressure 114/70, pulse 76, height '5\' 5"'$  (1.651 m), weight 131 lb (59.4 kg), SpO2 100 %. Gen:      No acute distress HEENT:  EOMI, sclera anicteric Neck:     No masses; no thyromegaly Lungs:    Scattered basilar crackles. CV:         Regular rate and rhythm; no murmurs Abd:      + bowel sounds; soft, non-tender; no palpable masses, no distension Ext:    No edema; adequate peripheral perfusion Skin:      Warm and dry;  no rash Neuro: alert and oriented x 3 Psych: normal mood and affect  Data Reviewed: FENO 09/06/2017-unable to complete  PFTs 07/27/2017 FVC 1.65 (54%), FEV1 1.53 (2%], F/F 93, TLC 50, DLCO 44%, DLCO/VA 131% Severe restriction and diffusion impairment.  6-minute walk 10/23/2017 144 m Post walk heart rate, stats 94, 91%  CT chest, [facility] 07/24/2017 Dependent atelectasis, subtle groundglass opacities at the base.  2.5 cm right thyroid nodule.  I have reviewed the images personally  CT high-resolution 09/18/2017- patchy reticular groundglass opacities, reticulation with subpleural sparing.  Mild traction bronchiectasis.  No honeycombing.  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 Although the ILD appears mild there is significant reduction in her PFTs with restriction and diffusion impairment  Started on CellCept.  She has mild GI symptoms with cellcept. Advised to take medications with food Increase CellCept dose to 1 g twice daily. Start Bactrim prophylaxis. Monitor lung function closely. If there is worsening of lung disease then she may need initiation of Ofev based on recent SENSICUS trial.  Https://www.nejm.org/doi/10.1056/[email or web address]  She wants to know if she can return to work.  I advised her to stay away for now as she continues to be significantly symptomatic with fatigue and dyspnea.  Esophageal dysfunction GERD Continue on Protonix 20 mg twice daily  Plan/Recommendations: - Increase CellCept to 1 g twice daily.  Start Bactrim prophylaxis. - Monitor met panel, CBC  Marshell Garfinkel MD [facility] 10/23/2017, 2:42 PM

## 2017-10-23 NOTE — Progress Notes (Signed)
SIX MIN WALK 10/23/2017  Medications Pt states that she took all of medications but could not tell me which ones  Supplimental Oxygen during Test? (L/min) No  Laps 3  Partial Lap (in Meters) 0  Baseline BP (sitting) 118/64  Baseline Heartrate 85  Baseline Dyspnea (Borg Scale) 3  Baseline Fatigue (Borg Scale) 7  Baseline SPO2 97  BP (sitting) 124/76  Heartrate 94  Dyspnea (Borg Scale) 6  Fatigue (Borg Scale) 8.5  SPO2 91  BP (sitting) 114/70  Heartrate 76  SPO2 100  Stopped or Paused before Six Minutes Yes  Other Symptoms at end of Exercise Pt complained of being very fatigued and short of breath.  Distance Completed 144

## 2017-10-23 NOTE — Telephone Encounter (Signed)
Called and spoke with the pharmacist, she is needing clarification on both Cellcept and bactrim. Dosage, quantity, and amount of refills. Dr. Vaughan Browner please advise, thank you.    Instructions   Increase the CellCept to 2 tablets twice daily Please take this medication with food We will start Bactrim 1 double strength tablet daily for prophylaxis Follow-up in 2 to 4 weeks with repeat CBC, comprehensive metabolic panel, spirometry, diffusion capacity.

## 2017-10-23 NOTE — Telephone Encounter (Signed)
Called and spoke to [place] with [facility].  Verified dosage and qty of both Cellcept and Bactrim. Nothing further is needed

## 2017-10-24 ENCOUNTER — Encounter: Payer: Self-pay | Admitting: Pulmonary Disease

## 2017-10-24 DIAGNOSIS — M349 Systemic sclerosis, unspecified: Secondary | ICD-10-CM | POA: Insufficient documentation

## 2017-10-25 ENCOUNTER — Telehealth: Payer: Self-pay | Admitting: Pulmonary Disease

## 2017-10-25 ENCOUNTER — Other Ambulatory Visit: Payer: Self-pay

## 2017-10-25 ENCOUNTER — Ambulatory Visit (INDEPENDENT_AMBULATORY_CARE_PROVIDER_SITE_OTHER): Payer: 59 | Admitting: Internal Medicine

## 2017-10-25 DIAGNOSIS — J849 Interstitial pulmonary disease, unspecified: Secondary | ICD-10-CM

## 2017-10-25 LAB — PULMONARY FUNCTION TEST
FEF 25-75 Pre: 1.75 L/sec
FEF2575-%PRED-PRE: 68 %
FEV1-%PRED-PRE: 51 %
FEV1-Pre: 1.26 L
FEV1FVC-%Pred-Pre: 101 %
FEV6-%Pred-Pre: 51 %
FEV6-PRE: 1.51 L
FEV6FVC-%Pred-Pre: 102 %
FVC-%PRED-PRE: 50 %
FVC-Pre: 1.51 L
PRE FEV6/FVC RATIO: 100 %
Pre FEV1/FVC ratio: 83 %

## 2017-10-25 NOTE — Telephone Encounter (Signed)
Forms were received from pt that she needs to have filled out and forms were hand delivered to [facility].

## 2017-10-25 NOTE — Telephone Encounter (Signed)
Rec'd FMLA forms for completion - fwd to [employer] for processing via interoffice mail -pr

## 2017-10-25 NOTE — Progress Notes (Addendum)
PFT performed 10/25/17. Pre spiro obtained. Unable to get DLCO due to not taking in a big enough breath. Patient stated that the first time performing the test was easier than this time. Patients arches on Pre spiro are not as tall as last test in March. Patient gave moderate effort.

## 2017-10-25 NOTE — Progress Notes (Signed)
pf

## 2017-11-07 ENCOUNTER — Telehealth: Payer: Self-pay | Admitting: Pulmonary Disease

## 2017-11-07 NOTE — Telephone Encounter (Signed)
I called [employer] and asked them to return my call. I wanted to see if they could have the paperwork done if we send it in Monday after Dr. Vaughan Browner signs it. Left message to call back to let me know.

## 2017-11-07 NOTE — Telephone Encounter (Signed)
Patient calling to check on status of FMLA paperwork.  Advised paperwork was recd from [employer] this am and that PM is due back in the office on 07/08.  She states due date is 11/13/2017 for paperwork. She is asking if there is anyway this can be completed Monday and sent to her employer.  CB is [phone]. She states she has part of the paperwork herself also that has to be returned also.

## 2017-11-07 NOTE — Telephone Encounter (Signed)
Spoke with Charlena Cross and she states she spoke with pt this morning and awaiting Dr. Matilde Bash signature. Please send back to her when complete.   Spoke with pt she would like a copy of the papers as well. Please leave a copy up front and call pt when ready to pick up.

## 2017-11-12 NOTE — Telephone Encounter (Signed)
ATC pt to make her aware that FMLA forms have been fwd back to [employer].  Unable to leave vm, as mailbox is full. Will call back.

## 2017-11-12 NOTE — Telephone Encounter (Signed)
Rec'd completed paperwork back from [employer]. Fwd to [employer] via interoffice mail-pr

## 2017-11-13 DIAGNOSIS — M3481 Systemic sclerosis with lung involvement: Secondary | ICD-10-CM | POA: Diagnosis not present

## 2017-11-13 DIAGNOSIS — R5383 Other fatigue: Secondary | ICD-10-CM | POA: Diagnosis not present

## 2017-11-13 DIAGNOSIS — E559 Vitamin D deficiency, unspecified: Secondary | ICD-10-CM | POA: Diagnosis not present

## 2017-11-13 DIAGNOSIS — I10 Essential (primary) hypertension: Secondary | ICD-10-CM | POA: Diagnosis not present

## 2017-11-16 ENCOUNTER — Encounter: Payer: Self-pay | Admitting: Pulmonary Disease

## 2017-11-16 ENCOUNTER — Other Ambulatory Visit (INDEPENDENT_AMBULATORY_CARE_PROVIDER_SITE_OTHER): Payer: 59

## 2017-11-16 ENCOUNTER — Ambulatory Visit (INDEPENDENT_AMBULATORY_CARE_PROVIDER_SITE_OTHER): Payer: 59 | Admitting: Pulmonary Disease

## 2017-11-16 VITALS — BP 102/76 | HR 52 | Ht 65.0 in | Wt 128.4 lb

## 2017-11-16 DIAGNOSIS — K219 Gastro-esophageal reflux disease without esophagitis: Secondary | ICD-10-CM

## 2017-11-16 DIAGNOSIS — Z5181 Encounter for therapeutic drug level monitoring: Secondary | ICD-10-CM | POA: Diagnosis not present

## 2017-11-16 DIAGNOSIS — J849 Interstitial pulmonary disease, unspecified: Secondary | ICD-10-CM | POA: Diagnosis not present

## 2017-11-16 DIAGNOSIS — M349 Systemic sclerosis, unspecified: Secondary | ICD-10-CM | POA: Diagnosis not present

## 2017-11-16 LAB — CBC WITH DIFFERENTIAL/PLATELET
Basophils Absolute: 0 10*3/uL (ref 0.0–0.1)
Basophils Relative: 0.5 % (ref 0.0–3.0)
EOS ABS: 0.1 10*3/uL (ref 0.0–0.7)
Eosinophils Relative: 1.5 % (ref 0.0–5.0)
HEMATOCRIT: 40.7 % (ref 36.0–46.0)
HEMOGLOBIN: 13.3 g/dL (ref 12.0–15.0)
LYMPHS PCT: 19.4 % (ref 12.0–46.0)
Lymphs Abs: 1.7 10*3/uL (ref 0.7–4.0)
MCHC: 32.8 g/dL (ref 30.0–36.0)
MCV: 86.7 fl (ref 78.0–100.0)
MONOS PCT: 10 % (ref 3.0–12.0)
Monocytes Absolute: 0.9 10*3/uL (ref 0.1–1.0)
NEUTROS ABS: 5.8 10*3/uL (ref 1.4–7.7)
Neutrophils Relative %: 68.6 % (ref 43.0–77.0)
PLATELETS: 260 10*3/uL (ref 150.0–400.0)
RBC: 4.7 Mil/uL (ref 3.87–5.11)
RDW: 14.1 % (ref 11.5–15.5)
WBC: 8.5 10*3/uL (ref 4.0–10.5)

## 2017-11-16 LAB — COMPREHENSIVE METABOLIC PANEL
ALBUMIN: 4.4 g/dL (ref 3.5–5.2)
ALT: 12 U/L (ref 0–35)
AST: 24 U/L (ref 0–37)
Alkaline Phosphatase: 70 U/L (ref 39–117)
BUN: 19 mg/dL (ref 6–23)
CALCIUM: 10.7 mg/dL — AB (ref 8.4–10.5)
CO2: 29 meq/L (ref 19–32)
Chloride: 101 mEq/L (ref 96–112)
Creatinine, Ser: 0.74 mg/dL (ref 0.40–1.20)
GFR: 107.04 mL/min (ref >60.00)
Glucose, Bld: 105 mg/dL — ABNORMAL HIGH (ref 70–99)
Potassium: 4 mEq/L (ref 3.5–5.1)
Sodium: 137 mEq/L (ref 135–145)
Total Bilirubin: 0.5 mg/dL (ref 0.2–1.2)
Total Protein: 8.5 g/dL — ABNORMAL HIGH (ref 6.0–8.3)

## 2017-11-16 NOTE — Progress Notes (Signed)
Samayra Hebel    [id number]    1970-11-17  Primary Care Physician:Gosrani, Doristine Johns, MD  Referring Physician: [name], MD [address] [place], [place] [address]  Chief complaint: Follow-up for scleroderma ILD  HPI: 47 year old with history of hypertension, headaches.  She has complaints of muscle ache, joint pain, fatigue, Raynaud's syndrome and was evaluated by Dr. Trudie Reed, rheumatology earlier this year.  She has a new diagnosis of scleroderma and has been referred to pulmonary for abnormal PFTs showing restriction in diffusion impairment. She also has been referred to Dr. Haroldine Laws, Cardiology for evaluation of pulmonary hypertension.   She has significant issues with dyspnea and gets short of breath walking 5 to 10 m on flat surface.  She does not have symptoms at rest.  Complains of cough, nonproductive in nature.  Denies any wheezing, fevers, chills.  Denies any lower extremity edema Has acid reflux and is on Protonix.  Reports that sometimes food gets stuck while swallowing.  She had a hospitalization in [facility] in March 2019 for hypertension.  A CT chest scan at that time showed subtle lower lung findings of atelectasis, groundglass.  She has a right thyroid nodule which was biopsied on 08/15/17 with pathology showing benign findings.  Thyroid function tests are normal. Underwent catheterization by Dr. Haroldine Laws on 09/27/17 with no evidence of pulmonary hypertension  Reviewed note from Dr. Trudie Reed, rheumatology 07/12/2017 Seen for joint pain, Raynaud's, elevated CK, skin thickening Serologies positive for Sjogren's negative,, normal CK and aldolase.  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  No mold. No hot tub, Jacuzzi. She has down comforter.  Smoking history: Never smoker Travel history: Grew up in [place].  Lived in [place] and [place] Relevant family history: No family history of lung disease.    Interim history: Continues on CellCept 1 g twice daily.  States that dyspnea is unchanged.  Still gets markedly symptomatic on minimal exertion  Outpatient Encounter Medications as of 11/16/2017  Medication Sig  . chlorthalidone (HYGROTON) 25 MG tablet Take 25 mg by mouth daily.   . Cholecalciferol (VITAMIN D-3) 5000 units TABS Take by mouth.  . mycophenolate (CELLCEPT) 500 MG tablet Take 2 tablets (1,000 mg total) by mouth 2 (two) times daily.  . Nebivolol HCl (BYSTOLIC) 20 MG TABS Take 20 mg by mouth daily.   . NP THYROID 30 MG tablet Take 30 mg by mouth daily.  . pantoprazole (PROTONIX) 20 MG tablet Take 1 tablet (20 mg total) by mouth 2 (two) times daily before a meal.  . spironolactone (ALDACTONE) 50 MG tablet Take 1 tablet (50 mg total) by mouth 2 (two) times daily.  Marland Kitchen sulfamethoxazole-trimethoprim (BACTRIM DS) 800-160 MG tablet 1 tablet daily  . [DISCONTINUED] cyanocobalamin 1000 MCG tablet Take 1,000 mcg daily by mouth.  . [DISCONTINUED] potassium chloride (K-DUR) 10 MEQ tablet Take 80 meq first day then take 40 meq daily (Patient taking differently: Take 40 mEq by mouth daily. )  . [DISCONTINUED] potassium chloride 20 MEQ/15ML (10%) SOLN Take 15 mLs (20 mEq total) by mouth daily.  . [  DISCONTINUED] Vitamin D, Ergocalciferol, (DRISDOL) 50000 units CAPS capsule Take 50,000 Units by mouth every Saturday.    No facility-administered encounter medications on file as of 11/16/2017.     Allergies as of 11/16/2017 - Review Complete 11/16/2017  Allergen  Reaction Noted  . Lisinopril Hives and Swelling 08/13/2015    Past Medical History:  Diagnosis Date  . Hypertension     Past Surgical History:  Procedure Laterality Date  . RIGHT HEART CATH N/A 09/27/2017   Procedure: RIGHT HEART CATH;  Surgeon: Jolaine Artist, MD;  Location: [facility];  Service: Cardiovascular;  Laterality: N/A;    Family History  Problem Relation Age of Onset  . Hypertension Father   . Hypertension Sister   . Hypertension Brother     Social History   Socioeconomic History  . Marital status: Single    Spouse name: Not on file  . Number of children: Not on file  . Years of education: Not on file  . Highest education level: Not on file  Occupational History  . Not on file  Social Needs  . Financial resource strain: Not on file  . Food insecurity:    Worry: Not on file    Inability: Not on file  . Transportation needs:    Medical: Not on file    Non-medical: Not on file  Tobacco Use  . Smoking status: Never Smoker  . Smokeless tobacco: Never Used  Substance and Sexual Activity  . Alcohol use: No    Frequency: Never  . Drug use: No  . Sexual activity: Not on file  Lifestyle  . Physical activity:    Days per week: Not on file    Minutes per session: Not on file  . Stress: Not on file  Relationships  . Social connections:    Talks on phone: Not on file    Gets together: Not on file    Attends religious service: Not on file    Active member of club or organization: Not on file    Attends meetings of clubs or organizations: Not on file    Relationship status: Not on file  . Intimate partner violence:    Fear of current or ex partner: Not on file    Emotionally abused: Not on file    Physically abused: Not on file    Forced sexual activity: Not on file  Other Topics Concern  . Not on file  Social History Narrative  . Not on file   Review of systems: Review of Systems  Constitutional: Negative for fever and chills.  HENT:  Negative.   Eyes: Negative for blurred vision.  Respiratory: as per HPI  Cardiovascular: Negative for chest pain and palpitations.  Gastrointestinal: Negative for vomiting, diarrhea, blood per rectum. Genitourinary: Negative for dysuria, urgency, frequency and hematuria.  Musculoskeletal: Negative for myalgias, back pain and joint pain.  Skin: Negative for itching and rash.  Neurological: Negative for dizziness, tremors, focal weakness, seizures and loss of consciousness.  Endo/Heme/Allergies: Negative for environmental allergies.  Psychiatric/Behavioral: Negative for depression, suicidal ideas and hallucinations.  All other systems reviewed and are negative.  Physical Exam: Blood pressure 102/76, pulse (!) 52, height 5' 5" (1.651 m), weight 128 lb 6.4 oz (58.2 kg), SpO2 98 %. Gen:      No acute distress HEENT:  EOMI, sclera anicteric Neck:     No masses; no thyromegaly Lungs:    Clear to auscultation bilaterally; normal respiratory effort CV:  Regular rate and rhythm; no murmurs Abd:      + bowel sounds; soft, non-tender; no palpable masses, no distension Ext:    No edema; adequate peripheral perfusion Skin:      Warm and dry; no rash Neuro: alert and oriented x 3 Psych: normal mood and affect  Data Reviewed: FENO 09/06/2017-unable to complete  PFTs 07/27/2017 FVC 1.65 (54%), FEV1 1.53 (62%], F/F 93, TLC 50, DLCO 44%, DLCO/VA 131% Severe restriction and diffusion impairment.  PFTs 10/25/2017 FVC 1.51 [50%], FEV1 1.26 [51%], F/F 83 Severe restriction, unable to complete DLCO  6-minute walk 10/23/2017 144 m Post walk heart rate, stats 94, 91%  CT chest, [facility] 07/24/2017 Dependent atelectasis, subtle groundglass opacities at the base.  2.5 cm right thyroid nodule.  I have reviewed the images personally  CT high-resolution 09/18/2017- patchy reticular groundglass opacities, reticulation with subpleural sparing.  Mild traction bronchiectasis.  No honeycombing.  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 Although the ILD appears mild there is significant reduction in her PFTs with restriction and diffusion impairment  Currently on CellCept 1gm bid Increase dose to 1.5 g in the morning, 1 g at night Continue Bactrim prophylaxis  Check comprehensive metabolic panel, CBC today. Monitor lung function closely. If there is worsening of lung disease then she may need initiation of Ofev based on recent SENSICUS trial.  Https://www.nejm.org/doi/10.1056/NEJMoa1903076  Esophageal dysfunction GERD Continue on Protonix 20 mg twice daily  Plan/Recommendations: - Increase CellCept to 1.5 gm am and 1 gm pm - Continue Bactrim prophylaxis. - Monitor met panel, CBC  Marshell Garfinkel MD [facility] 11/16/2017, 3:42 PM

## 2017-11-16 NOTE — Patient Instructions (Signed)
Increase CellCept to 1.5 g in the morning [3 tablets], 1 g at night [2 tablets], continue Bactrim prophylaxis We will check some labs today including comprehensive metabolic panel, CBC Follow-up in 2 months.

## 2017-11-21 NOTE — Telephone Encounter (Signed)
Pt had apt with Dr. Vaughan Browner on 11/16/17 and was made aware of FMLA update.  Nothing further is needed.

## 2017-11-29 ENCOUNTER — Ambulatory Visit: Payer: 59 | Admitting: "Endocrinology

## 2017-12-04 ENCOUNTER — Telehealth: Payer: Self-pay | Admitting: Pulmonary Disease

## 2017-12-04 MED FILL — PANTOPRAZOLE SOD DR 20 MG T: 20 | 30 days supply | Qty: 90 | Fill #0

## 2017-12-04 NOTE — Telephone Encounter (Signed)
Talked with Sonia Baller, medical records.  The paperwork the Patient is talking about is disability papers for a bank. Sonia Baller stated that the forms have been filled out and need MD signature. They will leave medical records today by courier, to [facility] office.  I called and explained process to Patient. She stated understanding. Nothing further at this time.

## 2017-12-04 NOTE — Telephone Encounter (Signed)
Called and spoke with Patient.  Patient stated that she left continuing benefits papers, not FMLA, at medical records. Medical records, Sonia Baller, told her that they would fax them to [facility].  Called and spoke with medical records, left message for Sonia Baller, to call back, in re guarding paper work.

## 2017-12-04 NOTE — Telephone Encounter (Signed)
Rec'd disability forms via interoffice mail from [employer]. Fwd to [facility] to have Dr. Vaughan Browner to sign. - pr

## 2017-12-05 DIAGNOSIS — M349 Systemic sclerosis, unspecified: Secondary | ICD-10-CM | POA: Diagnosis not present

## 2017-12-05 DIAGNOSIS — L700 Acne vulgaris: Secondary | ICD-10-CM | POA: Diagnosis not present

## 2017-12-05 DIAGNOSIS — L6681 Central centrifugal cicatricial alopecia: Secondary | ICD-10-CM | POA: Insufficient documentation

## 2017-12-05 DIAGNOSIS — L309 Dermatitis, unspecified: Secondary | ICD-10-CM | POA: Insufficient documentation

## 2017-12-05 DIAGNOSIS — L308 Other specified dermatitis: Secondary | ICD-10-CM | POA: Diagnosis not present

## 2017-12-05 HISTORY — DX: Central centrifugal cicatricial alopecia: L66.81

## 2017-12-05 MED FILL — CLINDAMYCIN PHOS-BENZOYL PE: 1-5 | 30 days supply | Qty: 50 | Fill #0

## 2017-12-07 NOTE — Telephone Encounter (Signed)
Forms have been given to Dr. Vaughan Browner to be completed.

## 2017-12-10 NOTE — Telephone Encounter (Signed)
Rec'd completed forms from [place] to [employer] via [employer] -pr

## 2017-12-17 DIAGNOSIS — M349 Systemic sclerosis, unspecified: Secondary | ICD-10-CM | POA: Diagnosis not present

## 2017-12-17 DIAGNOSIS — L308 Other specified dermatitis: Secondary | ICD-10-CM | POA: Diagnosis not present

## 2017-12-17 DIAGNOSIS — L650 Telogen effluvium: Secondary | ICD-10-CM | POA: Diagnosis not present

## 2017-12-17 DIAGNOSIS — L219 Seborrheic dermatitis, unspecified: Secondary | ICD-10-CM | POA: Diagnosis not present

## 2017-12-17 MED FILL — TRIAMCINOLONE 0.1% CREAM: 0.1 | 30 days supply | Qty: 45 | Fill #0

## 2017-12-17 MED FILL — HYDROCORTISONE 2.5% CREAM: 2.5 | 30 days supply | Qty: 30 | Fill #0

## 2017-12-17 MED FILL — NP THYROID 30 MG TABLET: 30 | 30 days supply | Qty: 30 | Fill #0

## 2017-12-18 ENCOUNTER — Telehealth: Payer: Self-pay | Admitting: Internal Medicine

## 2017-12-18 ENCOUNTER — Telehealth: Payer: Self-pay | Admitting: *Deleted

## 2017-12-18 ENCOUNTER — Other Ambulatory Visit: Payer: Self-pay | Admitting: *Deleted

## 2017-12-18 ENCOUNTER — Encounter: Payer: Self-pay | Admitting: *Deleted

## 2017-12-18 ENCOUNTER — Encounter: Payer: Self-pay | Admitting: Gastroenterology

## 2017-12-18 ENCOUNTER — Ambulatory Visit (INDEPENDENT_AMBULATORY_CARE_PROVIDER_SITE_OTHER): Payer: 59 | Admitting: Gastroenterology

## 2017-12-18 DIAGNOSIS — K219 Gastro-esophageal reflux disease without esophagitis: Secondary | ICD-10-CM

## 2017-12-18 DIAGNOSIS — R1310 Dysphagia, unspecified: Secondary | ICD-10-CM | POA: Insufficient documentation

## 2017-12-18 DIAGNOSIS — R1319 Other dysphagia: Secondary | ICD-10-CM

## 2017-12-18 HISTORY — DX: Gastro-esophageal reflux disease without esophagitis: K21.9

## 2017-12-18 MED ORDER — PANTOPRAZOLE SODIUM 40 MG PO TBEC: 40.0000 mg | DELAYED_RELEASE_TABLET | Freq: Two times a day (BID) | ORAL | 3 refills | Status: DC

## 2017-12-18 NOTE — Telephone Encounter (Signed)
Pre-op scheduled for 01/21/18 at 1:45pm. [facility] message sent per pt request that is how she keeps up with her appointments

## 2017-12-18 NOTE — Telephone Encounter (Signed)
Initiated PA on [employer] for EGD/ED. Awaiting approval/denial.

## 2017-12-18 NOTE — Telephone Encounter (Signed)
Questions about her meds. Please call [phone]

## 2017-12-18 NOTE — Telephone Encounter (Signed)
Spoke with pt. Pt has Pantoprazole 20 mg at home and LSL would like her to start 40 mg bid. Pt would like to use the Pantoprazole 20mg  pills up first since she just got a new bottle. Pt is aware that she should take 2 20mg  tabs before breakfast and her evening meal until they are gone. RX of Pantoprazole 40 mg is a the pharmacy and she will take 1 pill bid after she finishes up the bottle of the 20mg  that she purchased.

## 2017-12-18 NOTE — Progress Notes (Signed)
CC'D TO PCP °

## 2017-12-18 NOTE — Patient Instructions (Signed)
1. Upper endoscopy as scheduled. See separate instructions. 2. Increase pantoprazole to 40 mg, 30 minutes before breakfast and 40 mg 30 minutes before your evening meal.  New prescription sent to pharmacy. 3. Consider colonoscopy at any time based on current recommendation guidelines for screening to begin at age 49.  Please let us know when you are ready.

## 2017-12-18 NOTE — Progress Notes (Signed)
Primary Care Physician:  [name], MD  Primary Gastroenterologist:  Garfield Cornea, MD   Chief Complaint  Patient presents with  . Gastroesophageal Reflux  . Dysphagia    HPI:  Karen [name] is a 47 y.o. female here at the request of Dr. Anastasio Champion for further evaluation of GERD and dysphagia.  Patient was diagnosed with scleroderma earlier this year.  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  She presents to [place] with complaints of refractory GERD.  Symptoms started several months ago. Started out on Protonix 20mg  once daily couple months ago. Gradually increased to 3 per day.  Continues to have significant heartburn/indigestion/belching. Regurgitation frequently. If lays on right side, then regurgitation at night.  Prior to pantoprazole she was taking over-the-counter Tums.  She continues to use Tums as needed as well.  She complains of swallowing problems with large pills, bread, meats for the past 4 to 5 months.  When she eats/drinks she feels like everything stops in the epigastrium.  Bowel movements are regular.  No blood in the stool or melena.  Overall her weight is down over the past 2 years from a high of 149 in April 2017 to 129 now.  Barium pill esophagram back in May showed laryngeal penetration without aspiration, mild esophageal dysmotility without obvious stricture.  Chest CT in May showed small hiatal hernia.  Current Outpatient Medications  Medication Sig Dispense Refill  . chlorthalidone (HYGROTON) 25 MG tablet Take 25 mg by mouth daily.     . Cholecalciferol (VITAMIN D-3) 5000 units TABS Take by mouth daily.     . mycophenolate (CELLCEPT) 500 MG tablet Take 2 tablets (1,000 mg total) by mouth 2 (two) times daily. (Patient taking differently: Take 1,000-1,500 mg by mouth 2 (two) times daily. Takes 3 tablets in am and 2 tablets in evening) 120 tablet 5  . Nebivolol  HCl (BYSTOLIC) 20 MG TABS Take 20 mg by mouth daily.     . NP THYROID 30 MG tablet Take 30 mg by mouth daily.  3  . pantoprazole (PROTONIX) 20 MG tablet Take 1 tablet (20 mg total) by mouth 2 (two) times daily before a meal. (Patient taking differently: Take 20 mg by mouth 2 (two) times daily before a meal. Has been taking 3 tablets/day) 60 tablet 0  . spironolactone (ALDACTONE) 50 MG tablet Take 1 tablet (50 mg total) by mouth 2 (two) times daily. 60 tablet 3  . sulfamethoxazole-trimethoprim (BACTRIM DS) 800-160 MG tablet 1 tablet daily 30 tablet 3   No current facility-administered medications for this visit.     Allergies as of 12/18/2017 - Review Complete 12/18/2017  Allergen Reaction Noted  . Lisinopril Hives and Swelling 08/13/2015    Past Medical History:  Diagnosis Date  . Hypertension   . Interstitial lung disease ([place])   . Multinodular thyroid    benign FNA 08/2017.  Marland Kitchen Scleroderma [facility])     Past Surgical History:  Procedure Laterality Date  . ABDOMINAL HERNIA REPAIR    . RIGHT HEART CATH N/A 09/27/2017   Procedure: RIGHT HEART CATH;  Surgeon: Jolaine Artist, MD;  Location: [facility];  Service: Cardiovascular;  Laterality: N/A;  . UTERINE FIBROID SURGERY      Family History  Problem Relation Age of Onset  . Hypertension Father   . Hypertension Sister   . Hypertension Brother   .  Diabetes Maternal Grandfather   . Diabetes Maternal Uncle   . Colon cancer Neg Hx     Social History   Socioeconomic History  . Marital status: Single    Spouse name: Not on file  . Number of children: Not on file  . Years of education: Not on file  . Highest education level: Not on file  Occupational History  . Not on file  Social Needs  . Financial resource strain: Not on file  . Food insecurity:    Worry: Not on file    Inability: Not on file  . Transportation needs:    Medical: Not on file    Non-medical: Not on file  Tobacco Use  . Smoking status: Never  Smoker  . Smokeless tobacco: Never Used  Substance and Sexual Activity  . Alcohol use: No    Frequency: Never  . Drug use: No  . Sexual activity: Not on file  Lifestyle  . Physical activity:    Days per week: Not on file    Minutes per session: Not on file  . Stress: Not on file  Relationships  . Social connections:    Talks on phone: Not on file    Gets together: Not on file    Attends religious service: Not on file    Active member of club or organization: Not on file    Attends meetings of clubs or organizations: Not on file    Relationship status: Not on file  . Intimate partner violence:    Fear of current or ex partner: Not on file    Emotionally abused: Not on file    Physically abused: Not on file    Forced sexual activity: Not on file  Other Topics Concern  . Not on file  Social History Narrative  . Not on file      ROS:  General: Negative for anorexia, fever, chills, positive fatigue, see HPI  eyes: Negative for vision changes.  ENT: Negative for hoarseness,  nasal congestion. CV: Negative for chest pain, angina, palpitations,  peripheral edema.  Positive DOE Respiratory: Negative for dyspnea at rest, cough, sputum, wheezing.  Positive DOE GI: See history of present illness. GU:  Negative for dysuria, hematuria, urinary incontinence, urinary frequency, nocturnal urination.  MS: Negative for joint pain, low back pain.   Derm: Negative for rash or itching.  Neuro: Negative for weakness, abnormal sensation, seizure, frequent headaches, memory loss, confusion.  Psych: Negative for anxiety, depression, suicidal ideation, hallucinations.  Endo: See HPI Heme: Negative for bruising or bleeding. Allergy: Negative for rash or hives.    Physical Examination:  BP 124/88   Pulse 77   Temp (!) 97.3 F (36.3 C) (Oral)   Ht 5\' 5"  (1.651 m)   Wt 129 lb (58.5 kg)   LMP 11/05/2017 (Approximate)   BMI 21.47 kg/m    General: Well-nourished, well-developed in no  acute distress.  Head: Normocephalic, atraumatic.   Eyes: Conjunctiva pink, no icterus. Mouth: Oropharyngeal mucosa moist and pink , no lesions erythema or exudate. Neck: Supple without thyromegaly, masses, or lymphadenopathy.  Lungs: CTA bilaterally  heart: Regular rate and rhythm, no murmurs rubs or gallops.  Abdomen: Bowel sounds are normal, nontender, nondistended, no hepatosplenomegaly or masses, no abdominal bruits or    hernia , no rebound or guarding.   Rectal: Not performed Extremities: No lower extremity edema. No clubbing or deformities.  Neuro: Alert and oriented x 4 , grossly normal neurologically.  Skin: Warm and dry,  no rash or jaundice.   Psych: Alert and cooperative, normal mood and affect.  Labs: Lab Results  Component Value Date   WBC 8.5 11/16/2017   HGB 13.3 11/16/2017   HCT 40.7 11/16/2017   MCV 86.7 11/16/2017   PLT 260.0 11/16/2017   Lab Results  Component Value Date   CREATININE 0.74 11/16/2017   BUN 19 11/16/2017   NA 137 11/16/2017   K 4.0 11/16/2017   CL 101 11/16/2017   CO2 29 11/16/2017   Lab Results  Component Value Date   ALT 12 11/16/2017   AST 24 11/16/2017   ALKPHOS 70 11/16/2017   BILITOT 0.5 11/16/2017     Imaging Studies: No results found.  Impression/plan  47 year old female presenting for further evaluation of refractory GERD, solid food/pill dysphasia.  Symptoms refractory to pantoprazole 20 mg 3 times daily.  No obvious stricture on esophagram, some mild dysmotility.  I suspect scleroderma playing a role in her symptoms.  We discussed given her refractory GERD and dysphagia we should consider EGD with esophageal dilation for diagnostic/therapeutic purposes.  Need to exclude other etiologies such as Candida esophagitis, viral esophagitis, eosinophilic esophagitis.  Residual lung disease, plan for deep sedation/anesthesiology assistance.  I have discussed the risks, alternatives, benefits with regards to but not limited to the risk  of reaction to medication, bleeding, infection, perforation and the patient is agreeable to proceed. Written consent to be obtained.  In the interim we will change her pantoprazole to 40 mg twice daily, 30 minutes before breakfast and evening meal.  Prescription provided.  Discussed that she is due for a screening colonoscopy at any time based on guidelines for screening to begin at age 22 for African-Americans.  She would like to hold off for now and will let us know when she is ready.

## 2017-12-19 NOTE — Telephone Encounter (Signed)
[facility] called and no PA is required for EGD/ED.

## 2018-01-01 MED FILL — CHLORTHALIDONE 25 MG TAB: 25 | 30 days supply | Qty: 30 | Fill #0

## 2018-01-01 MED FILL — PANTOPRAZOLE SOD DR 40 MG T: 40 | 90 days supply | Qty: 180 | Fill #0

## 2018-01-14 ENCOUNTER — Encounter: Payer: Self-pay | Admitting: Pulmonary Disease

## 2018-01-14 ENCOUNTER — Other Ambulatory Visit (INDEPENDENT_AMBULATORY_CARE_PROVIDER_SITE_OTHER): Payer: 59

## 2018-01-14 ENCOUNTER — Ambulatory Visit (INDEPENDENT_AMBULATORY_CARE_PROVIDER_SITE_OTHER): Payer: 59 | Admitting: Pulmonary Disease

## 2018-01-14 DIAGNOSIS — J849 Interstitial pulmonary disease, unspecified: Secondary | ICD-10-CM

## 2018-01-14 LAB — CBC WITH DIFFERENTIAL/PLATELET
Basophils Absolute: 0 K/uL (ref 0.0–0.1)
Basophils Relative: 0.4 % (ref 0.0–3.0)
Eosinophils Absolute: 0.1 K/uL (ref 0.0–0.7)
Eosinophils Relative: 1.1 % (ref 0.0–5.0)
HCT: 40.9 % (ref 36.0–46.0)
Hemoglobin: 13.2 g/dL (ref 12.0–15.0)
Lymphocytes Relative: 22.6 % (ref 12.0–46.0)
Lymphs Abs: 1.9 K/uL (ref 0.7–4.0)
MCHC: 32.4 g/dL (ref 30.0–36.0)
MCV: 86.5 fl (ref 78.0–100.0)
Monocytes Absolute: 0.8 K/uL (ref 0.1–1.0)
Monocytes Relative: 10.2 % (ref 3.0–12.0)
Neutro Abs: 5.4 K/uL (ref 1.4–7.7)
Neutrophils Relative %: 65.7 % (ref 43.0–77.0)
Platelets: 273 K/uL (ref 150.0–400.0)
RBC: 4.73 Mil/uL (ref 3.87–5.11)
RDW: 13.3 % (ref 11.5–15.5)
WBC: 8.2 K/uL (ref 4.0–10.5)

## 2018-01-14 LAB — COMPREHENSIVE METABOLIC PANEL
ALBUMIN: 4.5 g/dL (ref 3.5–5.2)
ALK PHOS: 63 U/L (ref 39–117)
ALT: 9 U/L (ref 0–35)
AST: 20 U/L (ref 0–37)
BILIRUBIN TOTAL: 0.5 mg/dL (ref 0.2–1.2)
BUN: 19 mg/dL (ref 6–23)
CALCIUM: 11.7 mg/dL — AB (ref 8.4–10.5)
CO2: 31 mEq/L (ref 19–32)
Chloride: 100 mEq/L (ref 96–112)
Creatinine, Ser: 0.73 mg/dL (ref 0.40–1.20)
GFR: 108.66 mL/min (ref >60.00)
Glucose, Bld: 92 mg/dL (ref 70–99)
Potassium: 5 mEq/L (ref 3.5–5.1)
Sodium: 139 mEq/L (ref 135–145)
TOTAL PROTEIN: 8.6 g/dL — AB (ref 6.0–8.3)

## 2018-01-14 MED FILL — NP THYROID 30 MG TABLET: 30 | 30 days supply | Qty: 30 | Fill #1

## 2018-01-14 MED FILL — SULFAMETHOXAZOLE-TMP DS TAB: 800-160 | 30 days supply | Qty: 30 | Fill #1

## 2018-01-14 NOTE — Patient Instructions (Addendum)
I am glad that your breathing is improving We will schedule a spirometry, diffusion capacity, 6-minute walk test and high-resolution CT  Follow-up in 1 month for review.

## 2018-01-14 NOTE — Progress Notes (Signed)
Karen [name]    [id number]    1971-04-30  Primary Care Physician:Gosrani, Doristine Johns, MD  Referring Physician: [name], MD [address] [place],  [address]  Chief complaint: Follow-up for scleroderma ILD  HPI: 47 year old with history of hypertension, headaches.  She has complaints of muscle ache, joint pain, fatigue, Raynaud's syndrome and was evaluated by Dr. Trudie Reed, rheumatology earlier this year.  She has a new diagnosis of scleroderma and has been referred to pulmonary for abnormal PFTs showing restriction in diffusion impairment. She also has been referred to Dr. Haroldine Laws, Cardiology for evaluation of pulmonary hypertension.   She has significant issues with dyspnea and gets short of breath walking 5 to 10 m on flat surface.  She does not have symptoms at rest.  Complains of cough, nonproductive in nature.  Denies any wheezing, fevers, chills.  Denies any lower extremity edema Has acid reflux and is on Protonix.  Reports that sometimes food gets stuck while swallowing.  She had a hospitalization in [facility] in March 2019 for hypertension.  A CT chest scan at that time showed subtle lower lung findings of atelectasis, groundglass.  She has a right thyroid nodule which was biopsied on 08/15/17 with pathology showing benign findings.  Thyroid function tests are normal. Underwent catheterization by Dr. Haroldine Laws on 09/27/17 with no evidence of pulmonary hypertension  Reviewed note from Dr. Trudie Reed, rheumatology 07/12/2017 Seen for joint pain, Raynaud's, elevated CK, skin thickening Serologies positive for Sjogren's negative,, normal CK and aldolase.  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  No mold. No hot tub, Jacuzzi. She has down comforter.  Smoking history: Never smoker Travel history: Grew up in [place].  Lived in [place] and [place] Relevant family history: No family history of lung disease.    Interim history: Continues on CellCept.  Feels that dyspnea is improving Complains of intermittent leg pain.  She is still feels she is not at the stage where she can return to work.  Physical Exam: Blood pressure 110/78, pulse 78, height _0  (1.651 m), weight 128 lb 3.2 oz (58.2 kg), SpO2 95 %. Gen:      No acute distress HEENT:  EOMI, sclera anicteric Neck:     No masses; no thyromegaly Lungs:    Clear to auscultation bilaterally; normal respiratory effort CV:         Regular rate and rhythm; no murmurs Abd:      + bowel sounds; soft, non-tender; no palpable masses, no distension Ext:    No edema; adequate peripheral perfusion Skin:      Warm and dry; no rash Neuro: alert and oriented x 3 Psych: normal mood and affect  Data Reviewed: Imaging CT chest, [facility] 07/24/2017 Dependent atelectasis, subtle groundglass opacities at the base.  2.5 cm right thyroid nodule.  I have reviewed the images personally  CT high-resolution 09/18/2017- patchy reticular groundglass opacities, reticulation with subpleural sparing.  Mild traction bronchiectasis.  No honeycombing.  Patulous esophagus, multinodular goiter I have reviewed the images personally   PFTs FENO 09/06/2017-unable  to complete  PFTs 07/27/2017 FVC 1.65 (54%), FEV1 1.53 (62%], F/F 93, TLC 50, DLCO 44%, DLCO/VA 131% Severe restriction and diffusion impairment.  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 Although the ILD appears mild there is significant reduction in her PFTs with restriction and diffusion impairment  Currently on CellCept 1.5 g in the morning, 1 g at night Continue Bactrim prophylaxis  Check comprehensive metabolic panel, CBC today.  Overall she is seems to be improving symptomatically.  We will schedule her for a repeat 6-minute walk test, PFTs and had a CT. Monitor lung function closely. If there is worsening of lung disease then she may need initiation of Ofev based on recent SENSICUS trial.  Https://www.nejm.org/doi/10.1056/NEJMoa1903076  Esophageal dysfunction GERD Continue on Protonix 20 mg twice daily  Plan/Recommendations: - Continue CellCept, Bactrim prophylaxis. - Monitor met panel, CBC - Reval with PFTs, 6 MW and high res CT  Outpatient Encounter Medications as of 01/14/2018  Medication Sig  . chlorthalidone (HYGROTON) 25 MG tablet Take 25 mg by mouth daily.   . Cholecalciferol (VITAMIN D-3) 5000 units TABS Take by mouth daily.   . mycophenolate (CELLCEPT) 500 MG tablet Take 2 tablets (1,000 mg total) by mouth 2 (two) times daily. (Patient taking differently: Take 1,000-1,500 mg by mouth 2 (two) times daily. Takes 3 tablets in am and 2 tablets in evening)  . Nebivolol HCl (BYSTOLIC) 20 MG TABS Take 20 mg by mouth daily.   . NP THYROID 30 MG tablet Take 30 mg by mouth daily.  . pantoprazole (PROTONIX) 40 MG tablet Take 1 tablet (40 mg  total) by mouth 2 (two) times daily before a meal.  . spironolactone (ALDACTONE) 50 MG tablet Take 1 tablet (50 mg total) by mouth 2 (two) times daily.  Marland Kitchen sulfamethoxazole-trimethoprim (BACTRIM DS) 800-160 MG tablet 1 tablet daily   No facility-administered encounter medications on file as of 01/14/2018.     Allergies as of 01/14/2018 - Review Complete 01/14/2018  Allergen Reaction Noted  . Lisinopril Hives and Swelling 08/13/2015    Past Medical History:  Diagnosis Date  . Hypertension   . Interstitial lung disease ([place])   . Multinodular thyroid    benign FNA 08/2017.  Marland Kitchen Scleroderma [facility])     Past Surgical History:  Procedure Laterality Date  . ABDOMINAL HERNIA REPAIR    . RIGHT HEART CATH N/A 09/27/2017   Procedure: RIGHT HEART CATH;  Surgeon: Jolaine Artist, MD;  Location: [place] [facility];  Service: Cardiovascular;  Laterality: N/A;  . UTERINE FIBROID SURGERY      Family History  Problem Relation Age of Onset  . Hypertension Father   . Hypertension Sister   . Hypertension Brother   . Diabetes Maternal Grandfather   . Diabetes Maternal Uncle   . Colon cancer Neg Hx     Social History   Socioeconomic History  . Marital status: Single  Spouse name: Not on file  . Number of children: Not on file  . Years of education: Not on file  . Highest education level: Not on file  Occupational History  . Not on file  Social Needs  . Financial resource strain: Not on file  . Food insecurity:    Worry: Not on file    Inability: Not on file  . Transportation needs:    Medical: Not on file    Non-medical: Not on file  Tobacco Use  . Smoking status: Never Smoker  . Smokeless tobacco: Never Used  Substance and Sexual Activity  . Alcohol use: No    Frequency: Never  . Drug use: No  . Sexual activity: Not on file  Lifestyle  . Physical activity:    Days per week: Not on file    Minutes per session: Not on file  . Stress: Not on file  Relationships  . Social  connections:    Talks on phone: Not on file    Gets together: Not on file    Attends religious service: Not on file    Active member of club or organization: Not on file    Attends meetings of clubs or organizations: Not on file    Relationship status: Not on file  . Intimate partner violence:    Fear of current or ex partner: Not on file    Emotionally abused: Not on file    Physically abused: Not on file    Forced sexual activity: Not on file  Other Topics Concern  . Not on file  Social History Narrative  . Not on file   Review of systems: Review of Systems  Constitutional: Negative for fever and chills.  HENT: Negative.   Eyes: Negative for blurred vision.  Respiratory: as per HPI  Cardiovascular: Negative for chest pain and palpitations.  Gastrointestinal: Negative for vomiting, diarrhea, blood per rectum. Genitourinary: Negative for dysuria, urgency, frequency and hematuria.  Musculoskeletal: Negative for myalgias, back pain and joint pain.  Skin: Negative for itching and rash.  Neurological: Negative for dizziness, tremors, focal weakness, seizures and loss of consciousness.  Endo/Heme/Allergies: Negative for environmental allergies.  Psychiatric/Behavioral: Negative for depression, suicidal ideas and hallucinations.  All other systems reviewed and are negative.  Marshell Garfinkel MD [facility] 01/14/2018, 3:25 PM

## 2018-01-21 ENCOUNTER — Encounter (HOSPITAL_COMMUNITY)
Admission: RE | Admit: 2018-01-21 | Discharge: 2018-01-21 | Disposition: A | Discharge status: Home / Self Care | Payer: 59 | Source: Ambulatory Visit | Attending: Internal Medicine | Admitting: Internal Medicine

## 2018-01-21 ENCOUNTER — Encounter (HOSPITAL_COMMUNITY): Payer: Self-pay

## 2018-01-21 DIAGNOSIS — I10 Essential (primary) hypertension: Secondary | ICD-10-CM | POA: Diagnosis not present

## 2018-01-21 DIAGNOSIS — Z01812 Encounter for preprocedural laboratory examination: Secondary | ICD-10-CM | POA: Insufficient documentation

## 2018-01-21 DIAGNOSIS — M3500 Sicca syndrome, unspecified: Secondary | ICD-10-CM | POA: Diagnosis not present

## 2018-01-21 DIAGNOSIS — M349 Systemic sclerosis, unspecified: Secondary | ICD-10-CM | POA: Diagnosis not present

## 2018-01-21 DIAGNOSIS — E042 Nontoxic multinodular goiter: Secondary | ICD-10-CM | POA: Diagnosis not present

## 2018-01-21 DIAGNOSIS — J8410 Pulmonary fibrosis, unspecified: Secondary | ICD-10-CM | POA: Diagnosis not present

## 2018-01-21 DIAGNOSIS — Z8249 Family history of ischemic heart disease and other diseases of the circulatory system: Secondary | ICD-10-CM | POA: Diagnosis not present

## 2018-01-21 DIAGNOSIS — Z79899 Other long term (current) drug therapy: Secondary | ICD-10-CM | POA: Diagnosis not present

## 2018-01-21 DIAGNOSIS — Z888 Allergy status to other drugs, medicaments and biological substances status: Secondary | ICD-10-CM | POA: Diagnosis not present

## 2018-01-21 LAB — PREGNANCY, URINE: Preg Test, Ur: NEGATIVE

## 2018-01-21 NOTE — Patient Instructions (Addendum)
Karen [name]  01/21/2018     @PREFPERIOPPHARMACY @   Your procedure is scheduled on 01/28/2018.  Report to [facility] at 6:30 A.M.  Call this number if you have problems the morning of surgery:  [phone]   Remember:  Do not eat or drink after midnight.   Take these medicines the morning of surgery with A SIP OF WATER : Bystolic, Protonix, Chlorthadone, thyroid medication    Do not wear jewelry, make-up or nail polish.  Do not wear lotions, powders, or perfumes, or deodorant.  Do not shave 48 hours prior to surgery.  Men may shave face and neck.  Do not bring valuables to the hospital.  [facility] is not responsible for any belongings or valuables.  Contacts, dentures or bridgework may not be worn into surgery.  Leave your suitcase in the car.  After surgery it may be brought to your room.  For patients admitted to the hospital, discharge time will be determined by your treatment team.  Patients discharged the day of surgery will not be allowed to drive home.   Name and phone number of your driver:   family Special instructions:  n/a  Please read over the following fact sheets that you were given. Care and Recovery After Surgery    Esophagogastroduodenoscopy Esophagogastroduodenoscopy (EGD) is a procedure to examine the lining of the esophagus, stomach, and first part of the small intestine (duodenum). This procedure is done to check for problems such as inflammation, bleeding, ulcers, or growths. During this procedure, a long, flexible, lighted tube with a camera attached (endoscope) is inserted down the throat. Tell a health care provider about:  Any allergies you have.  All medicines you are taking, including vitamins, herbs, eye drops, creams, and over-the-counter medicines.  Any problems you or family members have had with anesthetic medicines.  Any blood disorders you have.  Any surgeries you have had.  Any medical conditions you have.  Whether you are  pregnant or may be pregnant. What are the risks? Generally, this is a safe procedure. However, problems may occur, including:  Infection.  Bleeding.  A tear (perforation) in the esophagus, stomach, or duodenum.  Trouble breathing.  Excessive sweating.  Spasms of the larynx.  A slowed heartbeat.  Low blood pressure.  What happens before the procedure?  Follow instructions from your health care provider about eating or drinking restrictions.  Ask your health care provider about: ? Changing or stopping your regular medicines. This is especially important if you are taking diabetes medicines or blood thinners. ? Taking medicines such as aspirin and ibuprofen. These medicines can thin your blood. Do not take these medicines before your procedure if your health care provider instructs you not to.  Plan to have someone take you home after the procedure.  If you wear dentures, be ready to remove them before the procedure. What happens during the procedure?  To reduce your risk of infection, your health care team will wash or sanitize their hands.  An IV tube will be put in a vein in your hand or arm. You will get medicines and fluids through this tube.  You will be given one or more of the following: ? A medicine to help you relax (sedative). ? A medicine to numb the area (local anesthetic). This medicine may be sprayed into your throat. It will make you feel more comfortable and keep you from gagging or coughing during the procedure. ? A medicine for pain.  A mouth guard  may be placed in your mouth to protect your teeth and to keep you from biting on the endoscope.  You will be asked to lie on your left side.  The endoscope will be lowered down your throat into your esophagus, stomach, and duodenum.  Air will be put into the endoscope. This will help your health care provider see better.  The lining of your esophagus, stomach, and duodenum will be examined.  Your health  care provider may: ? Take a tissue sample so it can be looked at in a lab (biopsy). ? Remove growths. ? Remove objects (foreign bodies) that are stuck. ? Treat any bleeding with medicines or other devices that stop tissue from bleeding. ? Widen (dilate) or stretch narrowed areas of your esophagus and stomach.  The endoscope will be taken out. The procedure may vary among health care providers and hospitals. What happens after the procedure?  Your blood pressure, heart rate, breathing rate, and blood oxygen level will be monitored often until the medicines you were given have worn off.  Do not eat or drink anything until the numbing medicine has worn off and your gag reflex has returned. This information is not intended to replace advice given to you by your health care provider. Make sure you discuss any questions you have with your health care provider. Document Released: 08/25/2004 Document Revised: 09/30/2015 Document Reviewed: 03/18/2015 Elsevier Interactive Patient Education  [employer].

## 2018-01-24 ENCOUNTER — Ambulatory Visit (HOSPITAL_COMMUNITY)
Admission: RE | Admit: 2018-01-24 | Discharge: 2018-01-24 | Disposition: A | Discharge status: Home / Self Care | Payer: 59 | Source: Ambulatory Visit | Attending: Internal Medicine | Admitting: Internal Medicine

## 2018-01-24 ENCOUNTER — Other Ambulatory Visit: Payer: Self-pay

## 2018-01-24 ENCOUNTER — Encounter (HOSPITAL_COMMUNITY): Payer: Self-pay | Admitting: Internal Medicine

## 2018-01-24 VITALS — BP 118/70 | HR 75 | Wt 129.0 lb

## 2018-01-24 DIAGNOSIS — Z8249 Family history of ischemic heart disease and other diseases of the circulatory system: Secondary | ICD-10-CM | POA: Insufficient documentation

## 2018-01-24 DIAGNOSIS — M349 Systemic sclerosis, unspecified: Secondary | ICD-10-CM | POA: Diagnosis not present

## 2018-01-24 DIAGNOSIS — Z888 Allergy status to other drugs, medicaments and biological substances status: Secondary | ICD-10-CM | POA: Diagnosis not present

## 2018-01-24 DIAGNOSIS — J8410 Pulmonary fibrosis, unspecified: Secondary | ICD-10-CM | POA: Diagnosis not present

## 2018-01-24 DIAGNOSIS — M3500 Sicca syndrome, unspecified: Secondary | ICD-10-CM | POA: Diagnosis not present

## 2018-01-24 DIAGNOSIS — E042 Nontoxic multinodular goiter: Secondary | ICD-10-CM | POA: Insufficient documentation

## 2018-01-24 DIAGNOSIS — I10 Essential (primary) hypertension: Secondary | ICD-10-CM | POA: Insufficient documentation

## 2018-01-24 DIAGNOSIS — Z79899 Other long term (current) drug therapy: Secondary | ICD-10-CM | POA: Insufficient documentation

## 2018-01-24 NOTE — Progress Notes (Signed)
Advanced Heart Failure Clinic Note   Referring Physician: Dr Posey Rea PCP: [name], MD PCP-Cardiologist: Mertie Moores, MD   HPI: Karen [name] is a 47 y.o. female [place] [facility] (with [name]) with a history of Sjogren's syndrome, multinodular goiter, HTN and recently diagnosed scleroderma referred by Dr. Gavin Pound for evaluation of pulmonary HTN in the setting of new diagnosis of scleroderma.  Since we last saw her has had RHC with minimally elevated pressures and hi-res CT which showed ILD. Follows with Dr. Vaughan Browner and scheduled for repeat CT next week. Getting around a lot better. Can do all ADLs and go to store without too much problem. Gets SOB on steps. No edema, orthopnea or PND. BP very well controlled.   Echo 5/19: EF 60-65% grade I DD RV normal. Mild TR.   PFTs 07/27/17: FVC 1.7 L, 56% FEV-1 1.5 1L, 62% DLCO 44%  RHC 5/19  RA = 2 RV = 33/6 PA = 32/10 (19) PCW = 6 Fick cardiac output/index = 5.0/3.0 PVR = 2.6 Ao sat = 99% PA sat = 74%, 75%  Hi-res CT 09/18/17 1. Spectrum of findings compatible with basilar predominant fibrotic interstitial lung disease with mild traction bronchiolectasis and no frank honeycombing. 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 Follow-up high-resolution chest CT may be obtained in 12 months to assess temporal pattern stability, as clinically warranted. 2. Trace dependent left pleural effusion. 3. Mildly patulous thoracic esophagus, typical of scleroderma. Small hiatal hernia. 4. Multinodular goiter with dominant 2.8 cm right thyroid lobe nodule. Correlate with recent thyroid ultrasound and thyroid nodule FNA results.   Past Medical History:  Diagnosis Date  . Hypertension   . Interstitial lung disease ([place])   . Multinodular thyroid    benign FNA 08/2017.  Marland Kitchen Scleroderma ([place])     Current Outpatient Medications  Medication Sig Dispense Refill  .  chlorthalidone (HYGROTON) 25 MG tablet Take 25 mg by mouth daily.     . Cholecalciferol (VITAMIN D-3) 5000 units TABS Take by mouth daily.     . mycophenolate (CELLCEPT) 500 MG tablet Tale 3 tablets in the AM and 2 tablets in the PM    . Nebivolol HCl (BYSTOLIC) 20 MG TABS Take 20 mg by mouth daily.     . NP THYROID 30 MG tablet Take 30 mg by mouth daily.  3  . pantoprazole (PROTONIX) 40 MG tablet Take 1 tablet (40 mg total) by mouth 2 (two) times daily before a meal. 180 tablet 3  . spironolactone (ALDACTONE) 50 MG tablet Take 1 tablet (50 mg total) by mouth 2 (two) times daily. 60 tablet 3  . sulfamethoxazole-trimethoprim (BACTRIM DS) 800-160 MG tablet 1 tablet daily 30 tablet 3   No current facility-administered medications for this encounter.     Allergies  Allergen Reactions  . Lisinopril Hives and Swelling      Social History   Socioeconomic History  . Marital status: Single    Spouse name: Not on file  . Number of children: Not on file  . Years of education: Not on file  . Highest education level: Not on file  Occupational History  . Not on file  Social Needs  . Financial resource strain: Not on file  . Food insecurity:    Worry: Not on file    Inability: Not on file  . Transportation needs:    Medical: Not on file    Non-medical: Not on file  Tobacco  Use  . Smoking status: Never Smoker  . Smokeless tobacco: Never Used  Substance and Sexual Activity  . Alcohol use: No    Frequency: Never  . Drug use: No  . Sexual activity: Not on file  Lifestyle  . Physical activity:    Days per week: Not on file    Minutes per session: Not on file  . Stress: Not on file  Relationships  . Social connections:    Talks on phone: Not on file    Gets together: Not on file    Attends religious service: Not on file    Active member of club or organization: Not on file    Attends meetings of clubs or organizations: Not on file    Relationship status: Not on file  . Intimate  partner violence:    Fear of current or ex partner: Not on file    Emotionally abused: Not on file    Physically abused: Not on file    Forced sexual activity: Not on file  Other Topics Concern  . Not on file  Social History Narrative  . Not on file      Family History  Problem Relation Age of Onset  . Hypertension Father   . Hypertension Sister   . Hypertension Brother   . Diabetes Maternal Grandfather   . Diabetes Maternal Uncle   . Colon cancer Neg Hx     Vitals:   01/24/18 1047  BP: 118/70  Pulse: 75  SpO2: 99%  Weight: 58.5 kg (129 lb)    PHYSICAL EXAM: General:  Well appearing. No respiratory difficulty HEENT: normal Neck: supple. JVP 6-7. Carotids 2+ bilat; no bruits. No lymphadenopathy + goiter Cor: PMI nondisplaced. Regular rate & rhythm. No rubs, gallops or murmurs. Lungs: clear Abdomen: soft, nontender, nondistended. No hepatosplenomegaly. No bruits or masses. Good bowel sounds. Extremities: no cyanosis, clubbing, rash, edema + blanching. scleryldactyly  Neuro: alert & oriented x 3, cranial nerves grossly intact. moves all 4 extremities w/o difficulty. Affect pleasant.   ASSESSMENT & PLAN:  1. Evaluation for pulmonary HTN - Echo 5/19: EF 60-65% grade I DD RV normal. Mild TR.  - RHC 5/19 with minimally elevated PA pressures  - W/u so far shows scleroderma related ILD and minimally elevated pulmonary pressures. Will not start selective pulmonay vasodilators at this point but will follow closely. Repeat echo 6 months. Will also likely need repeat RHC within 1 year.   2. HTN - BR well controlled on chlorthalidone 25 mg daily, spiro 50 mg daily and Bystolic 20 - Wants to stop 1 BP med. Ok to try to stop Bystolic if SBP > 517 should restart  3. Scleroderma - Followed by Dr Trudie Reed  4. Pulmonary fibrosis - High res CT chest c/w with ILD  - Followed by Dr Hedy Camara, MD 01/24/18

## 2018-01-24 NOTE — Addendum Note (Signed)
Encounter addended by: Harvie Junior, CMA on: 01/24/2018 11:14 AM  Actions taken: Sign clinical note

## 2018-01-24 NOTE — Patient Instructions (Signed)
Follow up and echo in 6 months   

## 2018-01-24 NOTE — Addendum Note (Signed)
Encounter addended by: Harvie Junior, CMA on: 01/24/2018 11:16 AM  Actions taken: Order list changed, Diagnosis association updated

## 2018-01-25 MED FILL — SPIRONOLACTONE 50 MG TAB: 50 | 30 days supply | Qty: 60 | Fill #0

## 2018-01-28 ENCOUNTER — Ambulatory Visit (HOSPITAL_COMMUNITY): Payer: 59 | Admitting: Anesthesiology

## 2018-01-28 ENCOUNTER — Telehealth: Payer: Self-pay | Admitting: *Deleted

## 2018-01-28 ENCOUNTER — Other Ambulatory Visit: Payer: Self-pay | Admitting: Pulmonary Disease

## 2018-01-28 ENCOUNTER — Ambulatory Visit (HOSPITAL_COMMUNITY)
Admission: RE | Admit: 2018-01-28 | Discharge: 2018-01-28 | Disposition: A | Discharge status: Home / Self Care | Payer: 59 | Source: Ambulatory Visit | Attending: Internal Medicine | Admitting: Internal Medicine

## 2018-01-28 ENCOUNTER — Encounter (HOSPITAL_COMMUNITY): Payer: Self-pay

## 2018-01-28 ENCOUNTER — Encounter (HOSPITAL_COMMUNITY)
Admission: RE | Disposition: A | Discharge status: Home / Self Care | Payer: Self-pay | Source: Ambulatory Visit | Attending: Internal Medicine

## 2018-01-28 DIAGNOSIS — K2100 Gastro-esophageal reflux disease with esophagitis, without bleeding: Secondary | ICD-10-CM

## 2018-01-28 DIAGNOSIS — R1310 Dysphagia, unspecified: Secondary | ICD-10-CM | POA: Diagnosis not present

## 2018-01-28 DIAGNOSIS — Z79899 Other long term (current) drug therapy: Secondary | ICD-10-CM | POA: Insufficient documentation

## 2018-01-28 DIAGNOSIS — K209 Esophagitis, unspecified: Secondary | ICD-10-CM | POA: Diagnosis not present

## 2018-01-28 DIAGNOSIS — I10 Essential (primary) hypertension: Secondary | ICD-10-CM | POA: Insufficient documentation

## 2018-01-28 DIAGNOSIS — K3184 Gastroparesis: Secondary | ICD-10-CM | POA: Insufficient documentation

## 2018-01-28 DIAGNOSIS — R12 Heartburn: Secondary | ICD-10-CM | POA: Diagnosis not present

## 2018-01-28 DIAGNOSIS — R1314 Dysphagia, pharyngoesophageal phase: Secondary | ICD-10-CM | POA: Insufficient documentation

## 2018-01-28 DIAGNOSIS — M349 Systemic sclerosis, unspecified: Secondary | ICD-10-CM | POA: Insufficient documentation

## 2018-01-28 DIAGNOSIS — K219 Gastro-esophageal reflux disease without esophagitis: Secondary | ICD-10-CM

## 2018-01-28 DIAGNOSIS — J849 Interstitial pulmonary disease, unspecified: Secondary | ICD-10-CM

## 2018-01-28 DIAGNOSIS — R1319 Other dysphagia: Secondary | ICD-10-CM

## 2018-01-28 HISTORY — PX: ESOPHAGOGASTRODUODENOSCOPY (EGD) WITH PROPOFOL: SHX5813

## 2018-01-28 LAB — GLUCOSE, CAPILLARY
GLUCOSE-CAPILLARY: 25 mg/dL — AB (ref 70–99)
GLUCOSE-CAPILLARY: 52 mg/dL — AB (ref 70–99)
GLUCOSE-CAPILLARY: 92 mg/dL (ref 70–99)
Glucose-Capillary: 61 mg/dL — ABNORMAL LOW (ref 70–99)
Glucose-Capillary: 67 mg/dL — ABNORMAL LOW (ref 70–99)

## 2018-01-28 SURGERY — ESOPHAGOGASTRODUODENOSCOPY (EGD) WITH PROPOFOL
Anesthesia: Monitor Anesthesia Care

## 2018-01-28 MED ORDER — PROPOFOL 10 MG/ML IV BOLUS
INTRAVENOUS | Status: AC
  Filled 2018-01-28: qty 40

## 2018-01-28 MED ORDER — DEXTROSE 50 % IV SOLN
12.5000 g | Freq: Once | INTRAVENOUS | Status: AC
  Administered 2018-01-28: 12.5 g via INTRAVENOUS

## 2018-01-28 MED ORDER — LACTATED RINGERS IV SOLN: INTRAVENOUS | Status: DC

## 2018-01-28 MED ORDER — MEPERIDINE HCL 100 MG/ML IJ SOLN: 6.2500 mg | INTRAMUSCULAR | Status: DC | PRN

## 2018-01-28 MED ORDER — DEXTROSE 50 % IV SOLN
INTRAVENOUS | Status: AC
  Filled 2018-01-28: qty 50

## 2018-01-28 MED ORDER — FENTANYL CITRATE (PF) 100 MCG/2ML IJ SOLN
INTRAMUSCULAR | Status: AC
  Filled 2018-01-28: qty 2

## 2018-01-28 MED ORDER — MIDAZOLAM HCL 5 MG/5ML IJ SOLN
INTRAMUSCULAR | Status: DC | PRN
  Administered 2018-01-28: 2 mg via INTRAVENOUS

## 2018-01-28 MED ORDER — PROMETHAZINE HCL 25 MG/ML IJ SOLN: 6.2500 mg | INTRAMUSCULAR | Status: DC | PRN

## 2018-01-28 MED ORDER — CHLORHEXIDINE GLUCONATE CLOTH 2 % EX PADS: 6.0000 | MEDICATED_PAD | Freq: Once | CUTANEOUS | Status: DC

## 2018-01-28 MED ORDER — HYDROMORPHONE HCL 1 MG/ML IJ SOLN: 0.2500 mg | INTRAMUSCULAR | Status: DC | PRN

## 2018-01-28 MED ORDER — MIDAZOLAM HCL 2 MG/2ML IJ SOLN
INTRAMUSCULAR | Status: AC
  Filled 2018-01-28: qty 2

## 2018-01-28 MED ORDER — HYDROCODONE-ACETAMINOPHEN 7.5-325 MG PO TABS: 1.0000 | ORAL_TABLET | Freq: Once | ORAL | Status: DC | PRN

## 2018-01-28 MED ORDER — LACTATED RINGERS IV SOLN
INTRAVENOUS | Status: DC
  Administered 2018-01-28: 09:00:00 via INTRAVENOUS

## 2018-01-28 MED ORDER — DEXTROSE 50 % IV SOLN
1.0000 | Freq: Once | INTRAVENOUS | Status: AC
  Administered 2018-01-28: 50 mL via INTRAVENOUS

## 2018-01-28 MED ORDER — DEXTROSE 5 % IV SOLN: INTRAVENOUS | Status: DC

## 2018-01-28 MED ORDER — PROPOFOL 500 MG/50ML IV EMUL
INTRAVENOUS | Status: DC | PRN
  Administered 2018-01-28: 150 ug/kg/min via INTRAVENOUS

## 2018-01-28 NOTE — Anesthesia Postprocedure Evaluation (Signed)
Anesthesia Post Note  Patient: Karen [name]  Procedure(s) Performed: ESOPHAGOGASTRODUODENOSCOPY (EGD) WITH PROPOFOL (N/A )  Patient location during evaluation: PACU Anesthesia Type: MAC Level of consciousness: awake and alert and oriented Pain management: pain level controlled Vital Signs Assessment: post-procedure vital signs reviewed and stable Respiratory status: spontaneous breathing, nonlabored ventilation and respiratory function stable Cardiovascular status: stable Postop Assessment: no apparent nausea or vomiting Anesthetic complications: no     Last Vitals:  Vitals:   01/28/18 0716  BP: 112/81  Pulse: 71  Resp: 20  Temp: (!) 36.4 C  SpO2: 96%    Last Pain:  Vitals:   01/28/18 0716  TempSrc: Oral  PainSc: 0-No pain                 Jaterrius Ricketson

## 2018-01-28 NOTE — Telephone Encounter (Signed)
Received call from [place] in Endo. She reports RMR wants patient to have emptying study done.

## 2018-01-28 NOTE — Telephone Encounter (Signed)
GES scheduled for 02/04/18 at 8:00am, arrival time 7:45am, npo after midnight, no stomach medications morning of test. (including pantoprazole). LMOVM for pt. [facility] message sent with appt details as well.

## 2018-01-28 NOTE — Telephone Encounter (Signed)
Patient called back and is aware of appt details. She reports she can't do this day. She has been provided with the # to r/s appt as she stated she has several upcoming appointment.

## 2018-01-28 NOTE — Discharge Instructions (Signed)
EGD Discharge instructions Please read the instructions outlined below and refer to this sheet in the next few weeks. These discharge instructions provide you with general information on caring for yourself after you leave the hospital. Your doctor may also give you specific instructions. While your treatment has been planned according to the most current medical practices available, unavoidable complications occasionally occur. If you have any problems or questions after discharge, please call your doctor. ACTIVITY  You may resume your regular activity but move at a slower pace for the next 24 hours.   Take frequent rest periods for the next 24 hours.   Walking will help expel (get rid of) the air and reduce the bloated feeling in your abdomen.   No driving for 24 hours (because of the anesthesia (medicine) used during the test).   You may shower.   Do not sign any important legal documents or operate any machinery for 24 hours (because of the anesthesia used during the test).  NUTRITION  Drink plenty of fluids.   You may resume your normal diet.   Begin with a light meal and progress to your normal diet.   Avoid alcoholic beverages for 24 hours or as instructed by your caregiver.  MEDICATIONS  You may resume your normal medications unless your caregiver tells you otherwise.  WHAT YOU CAN EXPECT TODAY  You may experience abdominal discomfort such as a feeling of fullness or gas pains.  FOLLOW-UP  Your doctor will discuss the results of your test with you.  SEEK IMMEDIATE MEDICAL ATTENTION IF ANY OF THE FOLLOWING OCCUR:  Excessive nausea (feeling sick to your stomach) and/or vomiting.   Severe abdominal pain and distention (swelling).   Trouble swallowing.   Temperature over 101 F (37.8 C).   Rectal bleeding or vomiting of blood.    Stop Protonix; begin Nexium 40 mg twice daily  Solid-phase gastric emptying study to evaluate for gastroparesis ( lazy stomach with  slow emptying)  Office will call with appointment.  Further recommendations to follow.   Monitored Anesthesia Care Anesthesia is a term that refers to techniques, procedures, and medicines that help a person stay safe and comfortable during a medical procedure. Monitored anesthesia care, or sedation, is one type of anesthesia. Your anesthesia specialist may recommend sedation if you will be having a procedure that does not require you to be unconscious, such as:  Cataract surgery.  A dental procedure.  A biopsy.  A colonoscopy.  During the procedure, you may receive a medicine to help you relax (sedative). There are three levels of sedation:  Mild sedation. At this level, you may feel awake and relaxed. You will be able to follow directions.  Moderate sedation. At this level, you will be sleepy. You may not remember the procedure.  Deep sedation. At this level, you will be asleep. You will not remember the procedure.  The more medicine you are given, the deeper your level of sedation will be. Depending on how you respond to the procedure, the anesthesia specialist may change your level of sedation or the type of anesthesia to fit your needs. An anesthesia specialist will monitor you closely during the procedure. Let your health care provider know about:  Any allergies you have.  All medicines you are taking, including vitamins, herbs, eye drops, creams, and over-the-counter medicines.  Any use of steroids (by mouth or as a cream).  Any problems you or family members have had with sedatives and anesthetic medicines.  Any blood disorders  you have.  Any surgeries you have had.  Any medical conditions you have, such as sleep apnea.  Whether you are pregnant or may be pregnant.  Any use of cigarettes, alcohol, or street drugs. What are the risks? Generally, this is a safe procedure. However, problems may occur, including:  Getting too much medicine  (oversedation).  Nausea.  Allergic reaction to medicines.  Trouble breathing. If this happens, a breathing tube may be used to help with breathing. It will be removed when you are awake and breathing on your own.  Heart trouble.  Lung trouble.  Before the procedure Staying hydrated Follow instructions from your health care provider about hydration, which may include:  Up to 2 hours before the procedure - you may continue to drink clear liquids, such as water, clear fruit juice, black coffee, and plain tea.  Eating and drinking restrictions Follow instructions from your health care provider about eating and drinking, which may include:  8 hours before the procedure - stop eating heavy meals or foods such as meat, fried foods, or fatty foods.  6 hours before the procedure - stop eating light meals or foods, such as toast or cereal.  6 hours before the procedure - stop drinking milk or drinks that contain milk.  2 hours before the procedure - stop drinking clear liquids.  Medicines Ask your health care provider about:  Changing or stopping your regular medicines. This is especially important if you are taking diabetes medicines or blood thinners.  Taking medicines such as aspirin and ibuprofen. These medicines can thin your blood. Do not take these medicines before your procedure if your health care provider instructs you not to.  Tests and exams  You will have a physical exam.  You may have blood tests done to show: ? How well your kidneys and liver are working. ? How well your blood can clot.  General instructions  Plan to have someone take you home from the hospital or clinic.  If you will be going home right after the procedure, plan to have someone with you for 24 hours.  What happens during the procedure?  Your blood pressure, heart rate, breathing, level of pain and overall condition will be monitored.  An IV tube will be inserted into one of your  veins.  Your anesthesia specialist will give you medicines as needed to keep you comfortable during the procedure. This may mean changing the level of sedation.  The procedure will be performed. After the procedure  Your blood pressure, heart rate, breathing rate, and blood oxygen level will be monitored until the medicines you were given have worn off.  Do not drive for 24 hours if you received a sedative.  You may: ? Feel sleepy, clumsy, or nauseous. ? Feel forgetful about what happened after the procedure. ? Have a sore throat if you had a breathing tube during the procedure. ? Vomit. This information is not intended to replace advice given to you by your health care provider. Make sure you discuss any questions you have with your health care provider. Document Released: 01/18/2005 Document Revised: 10/01/2015 Document Reviewed: 08/15/2015 Elsevier Interactive Patient Education  2018 [facility], Care After Refer to this sheet in the next few weeks. These instructions provide you with information about caring for yourself after your procedure. Your health care provider may also give you more specific instructions. Your treatment has been planned according to current medical practices, but problems sometimes occur. Call your  health care provider if you have any problems or questions after your procedure. What can I expect after the procedure? After the procedure, it is common to have:  A sore throat.  Bloating.  Nausea.  Follow these instructions at home:  Follow instructions from your health care provider about what to eat or drink after your procedure.  Return to your normal activities as told by your health care provider. Ask your health care provider what activities are safe for you.  Take over-the-counter and prescription medicines only as told by your health care provider.  Do not drive for 24 hours if you received a sedative.  Keep all follow-up  visits as told by your health care provider. This is important. Contact a health care provider if:  You have a sore throat that lasts longer than one day.  You have trouble swallowing. Get help right away if:  You have a fever.  You vomit blood or your vomit looks like coffee grounds.  You have bloody, black, or tarry stools.  You have a severe sore throat or you cannot swallow.  You have difficulty breathing.  You have severe pain in your chest or belly. This information is not intended to replace advice given to you by your health care provider. Make sure you discuss any questions you have with your health care provider. Document Released: 10/24/2011 Document Revised: 09/30/2015 Document Reviewed: 02/04/2015 Elsevier Interactive Patient Education  [employer].

## 2018-01-28 NOTE — Telephone Encounter (Signed)
Called [facility] and spoke with Shoua H and he advised me no PA is required for GES.

## 2018-01-28 NOTE — Addendum Note (Signed)
Addendum  created 01/28/18 1159 by Vista Deck, CRNA   Charge Capture section accepted

## 2018-01-28 NOTE — Transfer of Care (Signed)
Immediate Anesthesia Transfer of Care Note  Patient: Karen [name]  Procedure(s) Performed: ESOPHAGOGASTRODUODENOSCOPY (EGD) WITH PROPOFOL (N/A )  Patient Location: PACU  Anesthesia Type:MAC  Level of Consciousness: awake, alert  and oriented  Airway & Oxygen Therapy: Patient Spontanous Breathing and Patient connected to nasal cannula oxygen  Post-op Assessment: Report given to RN and Post -op Vital signs reviewed and stable  Post vital signs: Reviewed and stable  Last Vitals:  Vitals Value Taken Time  BP    Temp    Pulse    Resp    SpO2      Last Pain:  Vitals:   01/28/18 0716  TempSrc: Oral  PainSc: 0-No pain         Complications: No apparent anesthesia complications

## 2018-01-28 NOTE — H&P (Signed)
@LOGO @   Primary Care Physician:  [name], MD Primary Gastroenterologist:  Dr. [name]  Pre-Procedure History & Physical: HPI:  Karen [name] is a 47 y.o. female here for further evaluation of GERD and esophageal dysphagia via EGD.  Past Medical History:  Diagnosis Date  . Hypertension   . Interstitial lung disease ([place])   . Multinodular thyroid    benign FNA 08/2017.  Marland Kitchen Scleroderma [facility])     Past Surgical History:  Procedure Laterality Date  . ABDOMINAL HERNIA REPAIR    . RIGHT HEART CATH N/A 09/27/2017   Procedure: RIGHT HEART CATH;  Surgeon: Jolaine Artist, MD;  Location: [facility];  Service: Cardiovascular;  Laterality: N/A;  . UTERINE FIBROID SURGERY      Prior to Admission medications   Medication Sig Start Date End Date Taking? Authorizing Provider  chlorthalidone (HYGROTON) 25 MG tablet Take 25 mg by mouth daily.  07/25/17  Yes [provider]  Cholecalciferol (VITAMIN D-3) 5000 units TABS Take by mouth daily.    Yes [provider]  mycophenolate (CELLCEPT) 500 MG tablet Tale 3 tablets in the AM and 2 tablets in the PM   Yes [provider]  Nebivolol HCl (BYSTOLIC) 20 MG TABS Take 20 mg by mouth daily.    Yes [provider]  NP THYROID 30 MG tablet Take 30 mg by mouth daily. 11/13/17  Yes [provider]  pantoprazole (PROTONIX) 40 MG tablet Take 1 tablet (40 mg total) by mouth 2 (two) times daily before a meal. 12/18/17  Yes Mahala Menghini, PA-C  spironolactone (ALDACTONE) 50 MG tablet Take 1 tablet (50 mg total) by mouth 2 (two) times daily. 09/26/17  Yes Bensimhon, Shaune Pascal, MD  sulfamethoxazole-trimethoprim (BACTRIM DS) 800-160 MG tablet 1 tablet daily 10/23/17  Yes Mannam, Hart Robinsons, MD    Allergies as of 12/18/2017 - Review Complete 12/18/2017  Allergen Reaction Noted  . Lisinopril Hives and Swelling 08/13/2015    Family History  Problem Relation Age of Onset  . Hypertension Father   .  Hypertension Sister   . Hypertension Brother   . Diabetes Maternal Grandfather   . Diabetes Maternal Uncle   . Colon cancer Neg Hx     Social History   Socioeconomic History  . Marital status: Single    Spouse name: Not on file  . Number of children: Not on file  . Years of education: Not on file  . Highest education level: Not on file  Occupational History  . Not on file  Social Needs  . Financial resource strain: Not on file  . Food insecurity:    Worry: Not on file    Inability: Not on file  . Transportation needs:    Medical: Not on file    Non-medical: Not on file  Tobacco Use  . Smoking status: Never Smoker  . Smokeless tobacco: Never Used  Substance and Sexual Activity  . Alcohol use: No    Frequency: Never  . Drug use: No  . Sexual activity: Not on file  Lifestyle  . Physical activity:    Days per week: Not on file    Minutes per session: Not on file  . Stress: Not on file  Relationships  . Social connections:    Talks on phone: Not on file    Gets together: Not on file    Attends religious service: Not on file    Active member of club or organization: Not on file  Attends meetings of clubs or organizations: Not on file    Relationship status: Not on file  . Intimate partner violence:    Fear of current or ex partner: Not on file    Emotionally abused: Not on file    Physically abused: Not on file    Forced sexual activity: Not on file  Other Topics Concern  . Not on file  Social History Narrative  . Not on file    Review of Systems: See HPI, otherwise negative ROS  Physical Exam: BP 112/81   Pulse 71   Temp (!) 97.5 F (36.4 C) (Oral)   Resp 20   SpO2 96%  General:   Alert,  Well-developed, well-nourished, pleasant and cooperative in NAD Neck:  Supple; no masses or thyromegaly. No significant cervical adenopathy. Lungs:  Clear throughout to auscultation.   No wheezes, crackles, or rhonchi. No acute distress. Heart:  Regular rate and  rhythm; no murmurs, clicks, rubs,  or gallops. Abdomen: Non-distended, normal bowel sounds.  Soft and nontender without appreciable mass or hepatosplenomegaly.  Pulses:  Normal pulses noted. Extremities:  Without clubbing or edema.  Impression/Plan: dysphagia and GERD in a 47 year old lady with scleroderma on immunosuppressive therapy. EGD with possible esophageal dilation now being performed.  The risks, benefits, limitations, alternatives and imponderables have been reviewed with the patient. Potential for esophageal dilation, biopsy, etc. have also been reviewed.  Questions have been answered. All parties agreeable.     Notice: This dictation was prepared with Dragon dictation along with smaller phrase technology. Any transcriptional errors that result from this process are unintentional and may not be corrected upon review.

## 2018-01-28 NOTE — Op Note (Addendum)
[facility] Patient Name: Karen [name] Procedure Date: 01/28/2018 10:10 AM MRN: [id number] Date of Birth: 15-May-1970 Attending MD: Norvel Richards , MD CSN: [id number] Age: 47 Admit Type: Outpatient Procedure:                Upper GI endoscopy Indications:              Dysphagia, Heartburn Providers:                Norvel Richards, MD, Janeece Riggers, RN, Nelma Rothman, Technician Referring MD:              Medicines:                Propofol per Anesthesia Complications:            No immediate complications. Estimated Blood Loss:     Estimated blood loss: none. Procedure:                Pre-Anesthesia Assessment:                           - Prior to the procedure, a History and Physical                            was performed, and patient medications and                            allergies were reviewed. The patient's tolerance of                            previous anesthesia was also reviewed. The risks                            and benefits of the procedure and the sedation                            options and risks were discussed with the patient.                            All questions were answered, and informed consent                            was obtained. Prior Anticoagulants: The patient has                            taken no previous anticoagulant or antiplatelet                            agents. After reviewing the risks and benefits, the                            patient was deemed in satisfactory condition to  undergo the procedure.                           After obtaining informed consent, the endoscope was                            passed under direct vision. Throughout the                            procedure, the patient's blood pressure, pulse, and                            oxygen saturations were monitored continuously. The                            [id number]-[id number] ([id number]) scope was  introduced through the                            and advanced to the second part of duodenum. Scope In: 10:16:07 AM Scope Out: 10:19:11 AM Total Procedure Duration: 0 hours 3 minutes 4 seconds  Findings:      Esophagitis was found. patulous esophagus. Distal esophageal erosions -       skipped - coming up 5 cm from the GE junction. No Barrett's epithelium       seen. Much retained food in the gastric cavity precluded complete       evaluation and esophageal dilation (last intake of solid food 16 hours       before this procedure). Pylorus patent.      The duodenal bulb and second portion of the duodenum were normal. Impression:               - Erosive reflux esophagitis. Patulous EG junction.                            Incomplete EGD / No dilation. Underlying                            gastroparesis may be making a significant                            contribution to today's findings.                           - Normal duodenal bulb and second portion of the                            duodenum.                           - No specimens collected. Moderate Sedation:      Moderate (conscious) sedation was personally administered by an       anesthesia professional. The following parameters were monitored: oxygen       saturation, heart rate, blood pressure, respiratory rate, EKG, adequacy       of pulmonary ventilation, and response to care. Total physician       intraservice time was 1 minute.  Moderate (conscious) sedation was personally administered by an       anesthesia professional. The following parameters were monitored: oxygen       saturation, heart rate, blood pressure, respiratory rate, EKG, adequacy       of pulmonary ventilation, and response to care. Total physician       intraservice time was 9 minutes. Recommendation:           - Patient has a contact number available for                            emergencies. The signs and symptoms of potential                             delayed complications were discussed with the                            patient. Return to normal activities tomorrow.                            Written discharge instructions were provided to the                            patient.                           - Resume previous diet. Stop Protonix. Begin Nexium                            40 mg twice daily. Obtain a solid phase gastricg                            study.                           - Continue present medications.                           - Return to GI office (date not yet determined). Procedure Code(s):        --- Professional ---                           [phone], Esophagogastroduodenoscopy, flexible,                            transoral; diagnostic, including collection of                            specimen(s) by brushing or washing, when performed                            (separate procedure) Diagnosis Code(s):        --- Professional ---                           K20.9, Esophagitis, unspecified  R13.10, Dysphagia, unspecified                           R12, Heartburn CPT copyright 2017 American Medical Association. All rights reserved. The codes documented in this report are preliminary and upon coder review may  be revised to meet current compliance requirements. Cristopher Estimable. Vedder Brittian, MD Norvel Richards, MD 01/28/2018 10:30:13 AM This report has been signed electronically. Number of Addenda: 0

## 2018-01-28 NOTE — Anesthesia Preprocedure Evaluation (Signed)
Anesthesia Evaluation  Patient identified by MRN, date of birth, ID band Patient awake    Reviewed: Allergy & Precautions, H&P , NPO status , Patient's Chart, lab work & pertinent test results, reviewed documented beta blocker date and time   Airway Mallampati: I  TM Distance: >3 FB Neck ROM: full    Dental no notable dental hx. 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 Multinodular goiter  GERD with dysphagia  Reproductive/Obstetrics negative OB ROS                             Anesthesia Physical Anesthesia Plan  ASA: III  Anesthesia Plan: MAC   Post-op Pain Management:    Induction:   PONV Risk Score and Plan:   Airway Management Planned:   Additional Equipment:   Intra-op Plan:   Post-operative Plan:   Informed Consent: I have reviewed the patients History and Physical, chart, labs and discussed the procedure including the risks, benefits and alternatives for the proposed anesthesia with the patient or authorized representative who has indicated his/her understanding and acceptance.   Dental Advisory Given  Plan Discussed with: CRNA and Anesthesiologist  Anesthesia Plan Comments:         Anesthesia Quick Evaluation

## 2018-01-29 ENCOUNTER — Ambulatory Visit (INDEPENDENT_AMBULATORY_CARE_PROVIDER_SITE_OTHER)
Admission: RE | Admit: 2018-01-29 | Discharge: 2018-01-29 | Disposition: A | Discharge status: Home / Self Care | Payer: 59 | Source: Ambulatory Visit | Attending: Pulmonary Disease | Admitting: Pulmonary Disease

## 2018-01-29 ENCOUNTER — Ambulatory Visit (INDEPENDENT_AMBULATORY_CARE_PROVIDER_SITE_OTHER): Payer: 59 | Admitting: Pulmonary Disease

## 2018-01-29 DIAGNOSIS — J849 Interstitial pulmonary disease, unspecified: Secondary | ICD-10-CM

## 2018-01-29 DIAGNOSIS — J984 Other disorders of lung: Secondary | ICD-10-CM | POA: Diagnosis not present

## 2018-01-29 LAB — PULMONARY FUNCTION TEST
DL/VA % pred: 94 %
DL/VA: 4.65 ml/min/mmHg/L
DLCO UNC % PRED: 41 %
DLCO UNC: 10.57 ml/min/mmHg
FEF 25-75 Pre: 2.32 L/sec
FEF2575-%PRED-PRE: 91 %
FEV1-%Pred-Pre: 61 %
FEV1-PRE: 1.5 L
FEV1FVC-%PRED-PRE: 108 %
FEV6-%Pred-Pre: 57 %
FEV6-Pre: 1.69 L
FEV6FVC-%Pred-Pre: 103 %
FVC-%Pred-Pre: 55 %
FVC-Pre: 1.69 L
PRE FEV1/FVC RATIO: 89 %
PRE FEV6/FVC RATIO: 100 %

## 2018-01-29 LAB — GLUCOSE, CAPILLARY: Glucose-Capillary: 25 mg/dL — CL (ref 70–99)

## 2018-01-29 NOTE — Progress Notes (Signed)
Spirometry and Dlco done today. 

## 2018-01-30 ENCOUNTER — Telehealth: Payer: Self-pay | Admitting: Pulmonary Disease

## 2018-01-30 NOTE — Telephone Encounter (Signed)
Pt returning call; pt contact number [phone]

## 2018-01-30 NOTE — Telephone Encounter (Signed)
Left detailed message to make pt aware that ILD questionnaire will be mailed to address of file.  I have requested that she complete forms and bring them to her upcoming OV. Nothing further is needed.

## 2018-01-30 NOTE — Telephone Encounter (Signed)
Please see earlier phone note dated as 01/30/18. Pt is aware that ILD packet will be mailed to her. Nothing further is needed.

## 2018-01-31 ENCOUNTER — Encounter (HOSPITAL_COMMUNITY): Payer: Self-pay | Admitting: Internal Medicine

## 2018-01-31 DIAGNOSIS — M3481 Systemic sclerosis with lung involvement: Secondary | ICD-10-CM | POA: Diagnosis not present

## 2018-01-31 DIAGNOSIS — R5383 Other fatigue: Secondary | ICD-10-CM | POA: Diagnosis not present

## 2018-01-31 DIAGNOSIS — Z23 Encounter for immunization: Secondary | ICD-10-CM | POA: Diagnosis not present

## 2018-01-31 DIAGNOSIS — I10 Essential (primary) hypertension: Secondary | ICD-10-CM | POA: Diagnosis not present

## 2018-01-31 DIAGNOSIS — E559 Vitamin D deficiency, unspecified: Secondary | ICD-10-CM | POA: Diagnosis not present

## 2018-01-31 DIAGNOSIS — R1310 Dysphagia, unspecified: Secondary | ICD-10-CM | POA: Diagnosis not present

## 2018-02-04 ENCOUNTER — Encounter: Payer: Self-pay | Admitting: Pulmonary Disease

## 2018-02-04 ENCOUNTER — Ambulatory Visit (INDEPENDENT_AMBULATORY_CARE_PROVIDER_SITE_OTHER): Payer: 59 | Admitting: Pulmonary Disease

## 2018-02-04 ENCOUNTER — Ambulatory Visit (HOSPITAL_COMMUNITY)
Admission: RE | Admit: 2018-02-04 | Discharge: 2018-02-04 | Disposition: A | Discharge status: Home / Self Care | Payer: 59 | Source: Ambulatory Visit | Attending: Internal Medicine | Admitting: Internal Medicine

## 2018-02-04 ENCOUNTER — Ambulatory Visit: Payer: 59 | Admitting: *Deleted

## 2018-02-04 VITALS — BP 104/68 | HR 82 | Ht 65.0 in | Wt 128.0 lb

## 2018-02-04 DIAGNOSIS — R1310 Dysphagia, unspecified: Secondary | ICD-10-CM | POA: Insufficient documentation

## 2018-02-04 DIAGNOSIS — Z5181 Encounter for therapeutic drug level monitoring: Secondary | ICD-10-CM

## 2018-02-04 DIAGNOSIS — J849 Interstitial pulmonary disease, unspecified: Secondary | ICD-10-CM | POA: Diagnosis not present

## 2018-02-04 DIAGNOSIS — M349 Systemic sclerosis, unspecified: Secondary | ICD-10-CM | POA: Diagnosis not present

## 2018-02-04 DIAGNOSIS — K2100 Gastro-esophageal reflux disease with esophagitis, without bleeding: Secondary | ICD-10-CM

## 2018-02-04 DIAGNOSIS — K3184 Gastroparesis: Secondary | ICD-10-CM | POA: Diagnosis not present

## 2018-02-04 DIAGNOSIS — R1319 Other dysphagia: Secondary | ICD-10-CM

## 2018-02-04 MED ORDER — SODIUM CHLORIDE 0.9% FLUSH
INTRAVENOUS | Status: AC
  Filled 2018-02-04: qty 150

## 2018-02-04 MED ORDER — TECHNETIUM TC 99M SULFUR COLLOID
2.0000 | Freq: Once | INTRAVENOUS | Status: AC | PRN
  Administered 2018-02-04: 1.89 via ORAL

## 2018-02-04 MED ORDER — MYCOPHENOLATE MOFETIL 500 MG PO TABS: ORAL_TABLET | ORAL | 1 refills | Status: DC

## 2018-02-04 MED FILL — NP THYROID 60 MG TABLET: 60 | 30 days supply | Qty: 30 | Fill #0

## 2018-02-04 MED FILL — CHLORTHALIDONE 25 MG TAB: 25 | 30 days supply | Qty: 30 | Fill #1

## 2018-02-04 MED FILL — MYCOPHENOLATE 500 MG TABLET: 500 | 28 days supply | Qty: 170 | Fill #0

## 2018-02-04 NOTE — Patient Instructions (Signed)
Increase CellCept to 1.5 g twice daily Follow-up in 1 month with CBC, comprehensive metabolic panel.

## 2018-02-04 NOTE — Progress Notes (Signed)
Karen [name]    [id number]    [birth year]  Primary Care Physician:Gosrani, Doristine Johns, MD  Referring Physician: [name], MD [address] [place], [place] [address]  Chief complaint: Follow-up for scleroderma ILD  HPI: 47 year old with history of hypertension, headaches.  She has complaints of muscle ache, joint pain, fatigue, Raynaud's syndrome and was evaluated by Dr. Trudie Reed, rheumatology earlier this year.  She has a new diagnosis of scleroderma and has been referred to pulmonary for abnormal PFTs showing restriction in diffusion impairment. She also has been referred to Dr. Haroldine Laws, Cardiology for evaluation of pulmonary hypertension.   She has significant issues with dyspnea and gets short of breath walking 5 to 10 m on flat surface.  She does not have symptoms at rest.  Complains of cough, nonproductive in nature.  Denies any wheezing, fevers, chills.  Denies any lower extremity edema Has acid reflux and is on Protonix.  Reports that sometimes food gets stuck while swallowing.  She had a hospitalization in [facility] in March 2019 for hypertension.  A CT chest scan at that time showed subtle lower lung findings of atelectasis, groundglass.  She has a right thyroid nodule which was biopsied on 08/15/17 with pathology showing benign findings.  Thyroid function tests are normal. Underwent catheterization by Dr. Haroldine Laws on 09/27/17 with no evidence of pulmonary hypertension  Reviewed note from Dr. Trudie Reed, rheumatology 07/12/2017 Seen for joint pain, Raynaud's, elevated CK, skin thickening Serologies positive for Sjogren's negative,, normal CK and aldolase.  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  No mold. No hot tub, Jacuzzi. She has down comforter.  Smoking history: Never smoker Travel history: Grew up in [place].  Lived in [place] and [place] Relevant family history: No family history of lung disease.    Interim history: Continues on CellCept.  Feels that dyspnea is improving Still feels fatigued.  Outpatient Encounter Medications as of 02/04/2018  Medication Sig  . chlorthalidone (HYGROTON) 25 MG tablet Take 25 mg by mouth daily.   . Cholecalciferol (VITAMIN D-3) 5000 units TABS Take by mouth daily.   Marland Kitchen esomeprazole (NEXIUM) 40 MG capsule Take 40 mg by mouth 2 (two) times daily.  . mycophenolate (CELLCEPT) 500 MG tablet Tale 3 tablets in the AM and 2 tablets in the PM  . Nebivolol HCl (BYSTOLIC) 20 MG TABS Take 20 mg by mouth daily as needed.   . NP THYROID 30 MG tablet Take 60 mg by mouth daily.   Marland Kitchen spironolactone (ALDACTONE) 50 MG tablet Take 1 tablet (50 mg total) by mouth 2 (two) times daily.  Marland Kitchen sulfamethoxazole-trimethoprim (BACTRIM DS) 800-160 MG tablet 1 tablet daily  . [DISCONTINUED] pantoprazole (PROTONIX) 40 MG tablet Take 1 tablet (40 mg total) by mouth 2 (two) times daily before a meal.   Facility-Administered Encounter Medications as of 02/04/2018  Medication  . sodium chloride flush (NS) 0.9 % injection    Physical Exam: Blood pressure 104/68, pulse 82, height 5' 5" (1.651 m), weight 128 lb (58.1 kg), SpO2 92 %. Gen:  No acute distress HEENT:  EOMI, sclera anicteric Neck:     No masses; no thyromegaly Lungs:    Clear to auscultation bilaterally; normal respiratory effort CV:         Regular rate and rhythm; no murmurs Abd:      + bowel sounds; soft, non-tender; no palpable masses, no distension Ext:    No edema; adequate peripheral  perfusion Skin:      Warm and dry; no rash Neuro: alert and oriented x 3 Psych: normal mood and affect  Data Reviewed: Imaging CT chest, [facility] 07/24/2017 Dependent atelectasis, subtle groundglass opacities at the base.  2.5 cm right thyroid nodule.  I have reviewed the images personally  CT high-resolution 09/18/2017- patchy reticular groundglass opacities, reticulation with subpleural sparing.  Mild traction bronchiectasis.  No honeycombing.  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  Aortic atherosclerosis, three-vessel coronary artery disease. I have reviewed the images personally.  PFTs FENO 09/06/2017-unable to complete  07/27/2017 FVC 1.65 (54%), FEV1 1.53 (62%], F/F 93, TLC 50, DLCO 44%, DLCO/VA 131% Severe restriction and diffusion impairment.  10/25/2017 FVC 1.51 [50%], FEV1 1.26 [51%], F/F 83 Severe restriction, unable to complete DLCO  01/29/2018 FVC 1.69 [5%), FEV1 1.50 [61%], F/F 89, DLCO 10.57 [41%] Severe restriction and diffusion impairment.  6-minute walk  10/23/2017- 144 m Post walk heart rate, stats 94, 91%  02/04/2018- 249 m Post walk heart rate, sats 101, 99%  Labs 08/20/2017-ANA, centromere, rheumatoid factor, SCL 70-negative QuantiFERON 09/12/2017-negative Hepatitis B, C screening 09/21/2017-negative G6PD 09/25/2017-13  CBC, hepatic panel 01/14/1989-within normal limits.  Cardiac Cardiac cath 09/27/17 RA = 2 RV = 33/6 PA = 32/10 (19) PCW = 6 Fick cardiac output/index = 5.0/3.0 PVR = 2.6 Ao sat = 99% PA sat = 74%, 75%  Assessment:  Scleroderma ILD Reviewed the CT scan which shows NSIP fibrosis which is consistent with scleroderma related interstitial lung disease. Although the ILD appears mild there is significant reduction in her PFTs with restriction and diffusion impairment Review of her repeat CT scan, 6-minute walk test and PFTs show stable lung  function.  Currently on CellCept 1.5 g in the morning, 1 g at night.  Will increase it to 1.5 g twice daily Continue Bactrim prophylaxis  Continue monitoring comprehensive metabolic panel, CBC.  Overall she is seems to be improving symptomatically. Monitor lung function closely. If there is worsening of lung disease then she may need initiation of Ofev based on recent SENSICUS trial.  Https://www.nejm.org/doi/10.1056/NEJMoa1903076  Esophageal dysfunction GERD Continue on Protonix 20 mg twice daily  Plan/Recommendations: - Continue CellCept.  Increase dose to 1.5 g twice daily - Bactrim prophylaxis. - Monitor met panel, CBC  Marshell Garfinkel MD [facility] 02/04/2018, 3:36 PM

## 2018-02-08 ENCOUNTER — Encounter: Payer: Self-pay | Admitting: Pulmonary Disease

## 2018-02-08 DIAGNOSIS — M359 Systemic involvement of connective tissue, unspecified: Secondary | ICD-10-CM

## 2018-02-08 DIAGNOSIS — J849 Interstitial pulmonary disease, unspecified: Secondary | ICD-10-CM

## 2018-02-08 HISTORY — DX: Interstitial pulmonary disease, unspecified: J84.9

## 2018-02-08 HISTORY — DX: Systemic involvement of connective tissue, unspecified: M35.9

## 2018-02-20 ENCOUNTER — Encounter: Payer: Self-pay | Admitting: Neurology

## 2018-02-20 DIAGNOSIS — I7300 Raynaud's syndrome without gangrene: Secondary | ICD-10-CM | POA: Diagnosis not present

## 2018-02-20 DIAGNOSIS — M349 Systemic sclerosis, unspecified: Secondary | ICD-10-CM | POA: Diagnosis not present

## 2018-02-20 DIAGNOSIS — Z6821 Body mass index (BMI) 21.0-21.9, adult: Secondary | ICD-10-CM | POA: Diagnosis not present

## 2018-02-20 DIAGNOSIS — M545 Low back pain: Secondary | ICD-10-CM | POA: Diagnosis not present

## 2018-02-20 DIAGNOSIS — R202 Paresthesia of skin: Secondary | ICD-10-CM | POA: Diagnosis not present

## 2018-02-20 DIAGNOSIS — J849 Interstitial pulmonary disease, unspecified: Secondary | ICD-10-CM | POA: Diagnosis not present

## 2018-02-27 MED FILL — SPIRONOLACTONE 50 MG TABLET: 50 | 30 days supply | Qty: 60 | Fill #1

## 2018-03-01 MED FILL — CHLORTHALIDONE 25 MG TAB: 25 | 60 days supply | Qty: 60 | Fill #2

## 2018-03-06 ENCOUNTER — Other Ambulatory Visit (INDEPENDENT_AMBULATORY_CARE_PROVIDER_SITE_OTHER): Payer: 59

## 2018-03-06 ENCOUNTER — Encounter: Payer: Self-pay | Admitting: Pulmonary Disease

## 2018-03-06 ENCOUNTER — Ambulatory Visit (INDEPENDENT_AMBULATORY_CARE_PROVIDER_SITE_OTHER): Payer: 59 | Admitting: Pulmonary Disease

## 2018-03-06 VITALS — BP 108/72 | HR 103 | Ht 65.0 in | Wt 130.6 lb

## 2018-03-06 DIAGNOSIS — J849 Interstitial pulmonary disease, unspecified: Secondary | ICD-10-CM

## 2018-03-06 LAB — COMPREHENSIVE METABOLIC PANEL
ALK PHOS: 72 U/L (ref 39–117)
ALT: 10 U/L (ref 0–35)
AST: 20 U/L (ref 0–37)
Albumin: 4.5 g/dL (ref 3.5–5.2)
BUN: 18 mg/dL (ref 6–23)
CO2: 28 meq/L (ref 19–32)
Calcium: 11.1 mg/dL — ABNORMAL HIGH (ref 8.4–10.5)
Chloride: 101 mEq/L (ref 96–112)
Creatinine, Ser: 0.75 mg/dL (ref 0.40–1.20)
GFR: 105.27 mL/min (ref >60.00)
GLUCOSE: 91 mg/dL (ref 70–99)
POTASSIUM: 3.9 meq/L (ref 3.5–5.1)
Sodium: 138 mEq/L (ref 135–145)
TOTAL PROTEIN: 8.5 g/dL — AB (ref 6.0–8.3)
Total Bilirubin: 0.4 mg/dL (ref 0.2–1.2)

## 2018-03-06 LAB — CBC WITH DIFFERENTIAL/PLATELET
Basophils Absolute: 0.1 10*3/uL (ref 0.0–0.1)
Basophils Relative: 0.7 % (ref 0.0–3.0)
Eosinophils Absolute: 0.1 10*3/uL (ref 0.0–0.7)
Eosinophils Relative: 1.4 % (ref 0.0–5.0)
HCT: 41.7 % (ref 36.0–46.0)
Hemoglobin: 13.6 g/dL (ref 12.0–15.0)
LYMPHS ABS: 1.7 10*3/uL (ref 0.7–4.0)
Lymphocytes Relative: 21.6 % (ref 12.0–46.0)
MCHC: 32.7 g/dL (ref 30.0–36.0)
MCV: 86.8 fl (ref 78.0–100.0)
MONO ABS: 0.7 10*3/uL (ref 0.1–1.0)
Monocytes Relative: 9.1 % (ref 3.0–12.0)
NEUTROS ABS: 5.4 10*3/uL (ref 1.4–7.7)
NEUTROS PCT: 67.2 % (ref 43.0–77.0)
PLATELETS: 233 10*3/uL (ref 150.0–400.0)
RBC: 4.8 Mil/uL (ref 3.87–5.11)
RDW: 13.3 % (ref 11.5–15.5)
WBC: 8 10*3/uL (ref 4.0–10.5)

## 2018-03-06 NOTE — Patient Instructions (Signed)
We will check some labs today including comprehensive metabolic panel and CBC Continue part-time job until he can be reevaluated in 3 months  Follow-up in 3 months with spirometry, diffusion capacity and 6-minute walk test, comprehensive metabolic panel and CBC

## 2018-03-06 NOTE — Progress Notes (Signed)
Karen [name]    [id number]    09/11/1970  Primary Care Physician:Gosrani, Doristine Johns, MD  Referring Physician: [name], MD [place], [place] [address]  Chief complaint: Follow-up for scleroderma ILD  HPI: 47 year old with history of hypertension, headaches, scleroderma with interstitial lung disease  Diagnosed with scleroderma in early 2019 with NSIP fibrosis on CT scan Underwent catheterization by Dr. Haroldine Laws on 09/27/17 with no evidence of pulmonary hypertension Started on CellCept May 2019 and uptitrated to max dose of 1.5 mg twice daily.  She had a hospitalization in [facility] in March 2019 for hypertension.  A CT chest scan at that time showed subtle lower lung findings of atelectasis, groundglass.  She has a right thyroid nodule which was biopsied on 08/15/17 with pathology showing benign findings.  Thyroid function tests are normal.  Reviewed note from Dr. Trudie Reed, rheumatology 07/12/2017 Seen for joint pain, Raynaud's, elevated CK, skin thickening Serologies positive for Sjogren's negative,, normal CK and aldolase.  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  No mold. No hot tub, Jacuzzi. She has down comforter.  Smoking history: Never smoker Travel history: Grew up in [place].  Lived in [place] and [place] Relevant family history: No family history of lung disease.    Interim history: Continues on CellCept 1.5 mg twice daily.   Feels that overall her skin tightening and dyspnea is improved.  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  She is able to get through the work day but notes that she is fatigued by the end of the shift.  She still has dyspnea on exertion which is unchanged.  No cough, sputum production, fevers, chills. 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  Outpatient Encounter Medications as of 03/06/2018  Medication Sig  . chlorthalidone (HYGROTON) 25 MG tablet Take 25 mg by mouth daily.   . Cholecalciferol (VITAMIN D-3) 5000 units TABS Take by mouth daily.   Marland Kitchen esomeprazole (NEXIUM) 40 MG capsule Take 40 mg by mouth 2 (two) times daily.  . mycophenolate (CELLCEPT) 500 MG tablet Tale 3 tablets in the AM ('1500mg'$  total) and 3 tablets in the PM ('1500mg'$ )  . Nebivolol HCl (BYSTOLIC) 20 MG TABS Take 20 mg by mouth daily as needed.   . NP THYROID 30 MG tablet Take 60 mg by mouth daily.   Marland Kitchen spironolactone (ALDACTONE) 50 MG tablet Take 1 tablet (50 mg total) by mouth 2 (two) times daily. (Patient taking differently: Take 50 mg by mouth daily. )  . sulfamethoxazole-trimethoprim (BACTRIM DS) 800-160 MG tablet 1 tablet daily   No facility-administered encounter medications on file as of 03/06/2018.     Physical Exam: Blood pressure 108/72, pulse (!) 103, height '5\' 5"'$  (1.651 m), weight 130 lb 9.6 oz (59.2 kg), SpO2 97 %. Gen:      No acute distress HEENT:  EOMI, sclera anicteric Neck:     No masses; no thyromegaly Lungs:    Clear to auscultation bilaterally; normal respiratory effort CV:         Regular rate and rhythm; no murmurs Abd:      +  bowel sounds; soft, non-tender; no palpable masses, no distension Ext:    No edema; adequate peripheral perfusion Skin:      Warm and dry; no rash Neuro: alert and oriented x 3 Psych: normal mood and affect  Data Reviewed: Imaging CT chest, [facility] 07/24/2017 Dependent atelectasis, subtle groundglass opacities at the base.  2.5 cm right thyroid nodule.  I have reviewed the images personally  CT high-resolution  09/18/2017- patchy reticular groundglass opacities, reticulation with subpleural sparing.  Mild traction bronchiectasis.  No honeycombing.  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  Aortic atherosclerosis, three-vessel coronary artery disease. I have reviewed the images personally.  PFTs FENO 09/06/2017-unable to complete  07/27/2017 FVC 1.65 (54%), FEV1 1.53 (62%], F/F 93, TLC 50, DLCO 44%, DLCO/VA 131% Severe restriction and diffusion impairment.  10/25/2017 FVC 1.51 [50%], FEV1 1.26 [51%], F/F 83 Severe restriction, unable to complete DLCO  01/29/2018 FVC 1.69 [5%), FEV1 1.50 [61%], F/F 89, DLCO 10.57 [41%] Severe restriction and diffusion impairment.  6-minute walk  10/23/2017- 144 m Post walk heart rate, stats 94, 91%  02/04/2018- 249 m Post walk heart rate, sats 101, 99%  Labs 08/20/2017-ANA, centromere, rheumatoid factor, SCL 70-negative QuantiFERON 09/12/2017-negative Hepatitis B, C screening 09/21/2017-negative G6PD 09/25/2017-13  CBC, hepatic function panel 01/14/2018-within normal limits.  Cardiac Cardiac cath 09/27/17 RA = 2 RV = 33/6 PA = 32/10 (19) PCW = 6 Fick cardiac output/index = 5.0/3.0 PVR = 2.6 Ao sat = 99% PA sat = 74%, 75%  Assessment:  Scleroderma ILD Reviewed the CT scan which shows NSIP fibrosis which is consistent with scleroderma related interstitial lung disease. Although the ILD appears mild there is significant reduction in her PFTs with restriction and diffusion impairment. Review of her repeat CT scan, 6-minute walk test and PFTs show stable lung function.  Currently on CellCept 1.5 g in the morning, 1 g at night.  Will increase it to 1.5 g twice daily Continue Bactrim prophylaxis  Continue monitoring comprehensive metabolic panel, CBC.  Overall she is seems to be improving symptomatically.  Able to tolerate part-time work.  Will keep her work schedule at this intensity  for now until she can be reevaluated in 3 months. Monitor lung function closely. If there is worsening of lung disease then she may need initiation of Ofev based on recent SENSICUS trial.  Https://www.nejm.org/doi/10.1056/NEJMoa1903076  Esophageal dysfunction GERD Continue on Protonix 20 mg twice daily Follows with [facility] gastroenterology She is due to get dilatation of esophageal stricture in the near future.  Numbness of feet Referred to neurology by Dr. Trudie Reed.  Plan/Recommendations: - Continue CellCept at current dose of 1.5 twice daily. - Follow-up PFTs, 6-minute walk test in 3 months. - Bactrim prophylaxis. - Monitor met panel, CBC  Marshell Garfinkel MD [facility] 03/06/2018, 12:19 PM

## 2018-03-12 MED FILL — NP THYROID 60 MG TABLET: 60 | 30 days supply | Qty: 30 | Fill #1

## 2018-03-19 MED FILL — CLINDAMYCIN PHOS-BENZOYL PE: 1-5 | 30 days supply | Qty: 50 | Fill #1

## 2018-03-26 MED FILL — ESOMEPRAZOLE MAG DR 40 MG C: 40 | 30 days supply | Qty: 60 | Fill #0

## 2018-04-11 MED FILL — NP THYROID 60 MG TABLET: 60 | 30 days supply | Qty: 30 | Fill #2

## 2018-04-12 ENCOUNTER — Telehealth: Payer: Self-pay | Admitting: Pulmonary Disease

## 2018-04-12 ENCOUNTER — Telehealth: Payer: Self-pay | Admitting: Nurse Practitioner

## 2018-04-12 DIAGNOSIS — K219 Gastro-esophageal reflux disease without esophagitis: Secondary | ICD-10-CM

## 2018-04-12 MED ORDER — SUCRALFATE 1 GM/10ML PO SUSP: 1.0000 g | Freq: Four times a day (QID) | ORAL | 2 refills | Status: DC | PRN

## 2018-04-12 MED ORDER — AZITHROMYCIN 250 MG PO TABS: ORAL_TABLET | ORAL | 0 refills | Status: AC

## 2018-04-12 MED ORDER — ONDANSETRON HCL 4 MG PO TABS: 4.0000 mg | ORAL_TABLET | Freq: Three times a day (TID) | ORAL | 0 refills | Status: DC | PRN

## 2018-04-12 MED FILL — AZITHROMYCIN 250 MG TABLET: 250 | 5 days supply | Qty: 6 | Fill #0

## 2018-04-12 MED FILL — CARAFATE 1 GM/10 ML SUSP: 1 | 11 days supply | Qty: 420 | Fill #0

## 2018-04-12 MED FILL — ONDANSETRON HCL 4 MG TABLET: 4 | 10 days supply | Qty: 30 | Fill #0

## 2018-04-12 NOTE — Telephone Encounter (Signed)
Rx for Zofran 4mg  and Zpak has been sent to preferred pharmacy. Nothing further is needed.

## 2018-04-12 NOTE — Telephone Encounter (Signed)
Received notification to call patient back regarding page.  I called the patient who is having flares of GERD despite Nexium 40 mg bid. Flares typicaly overnight and wakes up regurgitating/vomiting bile during the night or first ting in the morening. She has known gastroparesis. Eats dinner about 8 pm and goes to bed within 1-1.5 hours afterward.  Recommended she continue Nexium 40 mg bid. Discussed eating no sooned than 3-4 hours before bed. Elevate head of bed. Will send in Rx for Carafate prn for breakthrough symptoms.  Requested she call Monday and let us know how she is doing.  She verbalized understanding of all instructions.

## 2018-04-12 NOTE — Telephone Encounter (Signed)
I called patient.  Looks like her office has been closed for neurovirus We will call in Zofran 4 mg p.o. every 8 hours as needed.  Dispense 30 tablets with no refills Advised her to drink plenty of fluids and stay well-hydrated.  She also is complaining of some sore throat but no mucus I will also call in Z-Pak.  I have asked her not to use it unless she has productive cough and fevers over the weekend.  Marshell Garfinkel MD [facility] 04/12/2018, 4:46 PM

## 2018-04-12 NOTE — Telephone Encounter (Signed)
Called and spoke with Patient.  Patient stated that her office has been closed today due to virus.  She stated that she started feeling bad this morning.  She denies fever or SHOB.  She has sore throat, cough with clear mucus, nausea, and vomiting x's 1.  She says she feels like she has phlegm in her throat. She is requesting a prescription to be sent to [facility] out patient pharmacy.   Will route to Dr. Vaughan Browner

## 2018-04-26 MED FILL — SPIRONOLACTONE 50 MG TABS: 50 | 30 days supply | Qty: 60 | Fill #2

## 2018-04-29 DIAGNOSIS — R5383 Other fatigue: Secondary | ICD-10-CM | POA: Diagnosis not present

## 2018-04-29 DIAGNOSIS — I10 Essential (primary) hypertension: Secondary | ICD-10-CM | POA: Diagnosis not present

## 2018-04-29 DIAGNOSIS — E559 Vitamin D deficiency, unspecified: Secondary | ICD-10-CM | POA: Diagnosis not present

## 2018-04-29 DIAGNOSIS — M3481 Systemic sclerosis with lung involvement: Secondary | ICD-10-CM | POA: Diagnosis not present

## 2018-04-29 MED FILL — NP THYROID 90 MG TABLET: 90 | 30 days supply | Qty: 30 | Fill #0

## 2018-04-30 ENCOUNTER — Ambulatory Visit: Payer: 59 | Admitting: Gastroenterology

## 2018-05-06 MED FILL — CHLORTHALIDONE 25 MG TABS: 25 | 30 days supply | Qty: 30 | Fill #0

## 2018-05-09 ENCOUNTER — Other Ambulatory Visit: Payer: Self-pay | Admitting: Internal Medicine

## 2018-05-09 ENCOUNTER — Other Ambulatory Visit (HOSPITAL_COMMUNITY): Payer: Self-pay | Admitting: Internal Medicine

## 2018-05-09 DIAGNOSIS — E041 Nontoxic single thyroid nodule: Secondary | ICD-10-CM

## 2018-05-10 ENCOUNTER — Emergency Department (HOSPITAL_COMMUNITY)
Admission: EM | Admit: 2018-05-10 | Discharge: 2018-05-10 | Disposition: A | Discharge status: Home / Self Care | Payer: 59 | Source: Home / Self Care | Attending: Emergency Medicine | Admitting: Emergency Medicine

## 2018-05-10 ENCOUNTER — Emergency Department (HOSPITAL_COMMUNITY): Payer: 59

## 2018-05-10 ENCOUNTER — Encounter (HOSPITAL_COMMUNITY): Payer: Self-pay | Admitting: Emergency Medicine

## 2018-05-10 ENCOUNTER — Emergency Department (HOSPITAL_COMMUNITY)
Admission: EM | Admit: 2018-05-10 | Discharge: 2018-05-10 | Discharge status: Against Medical Advice | Payer: 59 | Attending: Emergency Medicine | Admitting: Emergency Medicine

## 2018-05-10 ENCOUNTER — Other Ambulatory Visit: Payer: Self-pay

## 2018-05-10 DIAGNOSIS — I10 Essential (primary) hypertension: Secondary | ICD-10-CM

## 2018-05-10 DIAGNOSIS — Z79899 Other long term (current) drug therapy: Secondary | ICD-10-CM

## 2018-05-10 DIAGNOSIS — K8020 Calculus of gallbladder without cholecystitis without obstruction: Secondary | ICD-10-CM | POA: Diagnosis not present

## 2018-05-10 DIAGNOSIS — Z5321 Procedure and treatment not carried out due to patient leaving prior to being seen by health care provider: Secondary | ICD-10-CM | POA: Diagnosis not present

## 2018-05-10 DIAGNOSIS — N3000 Acute cystitis without hematuria: Secondary | ICD-10-CM | POA: Diagnosis not present

## 2018-05-10 DIAGNOSIS — K3184 Gastroparesis: Secondary | ICD-10-CM

## 2018-05-10 DIAGNOSIS — R197 Diarrhea, unspecified: Secondary | ICD-10-CM | POA: Diagnosis not present

## 2018-05-10 DIAGNOSIS — R112 Nausea with vomiting, unspecified: Secondary | ICD-10-CM | POA: Insufficient documentation

## 2018-05-10 LAB — CBC WITH DIFFERENTIAL/PLATELET
Abs Immature Granulocytes: 0.05 10*3/uL (ref 0.00–0.07)
BASOS ABS: 0 10*3/uL (ref 0.0–0.1)
BASOS PCT: 0 %
Eosinophils Absolute: 0.4 10*3/uL (ref 0.0–0.5)
Eosinophils Relative: 4 %
HCT: 42.8 % (ref 36.0–46.0)
Hemoglobin: 13.2 g/dL (ref 12.0–15.0)
Immature Granulocytes: 1 %
Lymphocytes Relative: 12 %
Lymphs Abs: 1.3 10*3/uL (ref 0.7–4.0)
MCH: 28.1 pg (ref 26.0–34.0)
MCHC: 30.8 g/dL (ref 30.0–36.0)
MCV: 91.3 fL (ref 80.0–100.0)
Monocytes Absolute: 0.7 10*3/uL (ref 0.1–1.0)
Monocytes Relative: 6 %
Neutro Abs: 8.3 10*3/uL — ABNORMAL HIGH (ref 1.7–7.7)
Neutrophils Relative %: 77 %
PLATELETS: 250 10*3/uL (ref 150–400)
RBC: 4.69 MIL/uL (ref 3.87–5.11)
RDW: 12.7 % (ref 11.5–15.5)
WBC: 10.6 10*3/uL — AB (ref 4.0–10.5)
nRBC: 0 % (ref 0.0–0.2)

## 2018-05-10 LAB — COMPREHENSIVE METABOLIC PANEL
ALT: 11 U/L (ref 0–44)
ANION GAP: 11 (ref 5–15)
AST: 23 U/L (ref 15–41)
Albumin: 4.4 g/dL (ref 3.5–5.0)
Alkaline Phosphatase: 72 U/L (ref 38–126)
BUN: 17 mg/dL (ref 6–20)
CO2: 26 mmol/L (ref 22–32)
Calcium: 11 mg/dL — ABNORMAL HIGH (ref 8.9–10.3)
Chloride: 101 mmol/L (ref 98–111)
Creatinine, Ser: 0.55 mg/dL (ref 0.44–1.00)
GFR calc Af Amer: >60 mL/min (ref >60)
GFR calc non Af Amer: >60 mL/min (ref >60)
Glucose, Bld: 129 mg/dL — ABNORMAL HIGH (ref 70–99)
Potassium: 3 mmol/L — ABNORMAL LOW (ref 3.5–5.1)
SODIUM: 138 mmol/L (ref 135–145)
Total Bilirubin: 0.6 mg/dL (ref 0.3–1.2)
Total Protein: 8.8 g/dL — ABNORMAL HIGH (ref 6.5–8.1)

## 2018-05-10 LAB — URINALYSIS, ROUTINE W REFLEX MICROSCOPIC
BILIRUBIN URINE: NEGATIVE
Glucose, UA: NEGATIVE mg/dL
Hgb urine dipstick: NEGATIVE
Ketones, ur: 5 mg/dL — AB
Nitrite: NEGATIVE
Protein, ur: 30 mg/dL — AB
Specific Gravity, Urine: 1.036 — ABNORMAL HIGH (ref 1.005–1.030)
pH: 6 (ref 5.0–8.0)

## 2018-05-10 LAB — LIPASE, BLOOD: Lipase: 26 U/L (ref 11–51)

## 2018-05-10 LAB — HCG, QUANTITATIVE, PREGNANCY: hCG, Beta Chain, Quant, S: <1 m[IU]/mL (ref <5)

## 2018-05-10 MED ORDER — HYDROMORPHONE HCL 1 MG/ML IJ SOLN
1.0000 mg | Freq: Once | INTRAMUSCULAR | Status: AC
  Administered 2018-05-10: 1 mg via INTRAVENOUS
  Filled 2018-05-10: qty 1

## 2018-05-10 MED ORDER — SODIUM CHLORIDE 0.9 % IV BOLUS
1000.0000 mL | Freq: Once | INTRAVENOUS | Status: AC
  Administered 2018-05-10: 1000 mL via INTRAVENOUS

## 2018-05-10 MED ORDER — METOCLOPRAMIDE HCL 10 MG PO TABS: 10.0000 mg | ORAL_TABLET | Freq: Four times a day (QID) | ORAL | 0 refills | Status: DC

## 2018-05-10 MED ORDER — ONDANSETRON HCL 4 MG/2ML IJ SOLN
4.0000 mg | Freq: Once | INTRAMUSCULAR | Status: AC
  Administered 2018-05-10: 4 mg via INTRAVENOUS
  Filled 2018-05-10: qty 2

## 2018-05-10 MED ORDER — IOPAMIDOL (ISOVUE-300) INJECTION 61%
80.0000 mL | Freq: Once | INTRAVENOUS | Status: AC | PRN
  Administered 2018-05-10: 80 mL via INTRAVENOUS

## 2018-05-10 MED ORDER — CEPHALEXIN 500 MG PO CAPS: 500.0000 mg | ORAL_CAPSULE | Freq: Two times a day (BID) | ORAL | 0 refills | Status: AC

## 2018-05-10 MED ORDER — CEPHALEXIN 500 MG PO CAPS: 500.0000 mg | ORAL_CAPSULE | Freq: Two times a day (BID) | ORAL | 0 refills | Status: DC

## 2018-05-10 NOTE — Discharge Instructions (Signed)
Keflex twice daily for 7 days Reglan every 6 hours as needed to help your bowels move faster ER for worsening symptoms.

## 2018-05-10 NOTE — ED Provider Notes (Signed)
[facility] EMERGENCY DEPARTMENT Provider Note   CSN: [id number] Arrival date & time: 05/10/18  1302    History   Chief Complaint Chief Complaint  Patient presents with  . Abdominal Pain    HPI Karen [name] is a 48 y.o. female.  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  She was taken immediately to the hospital but due to wait time she came here to this hospital instead.  She states that the pain is unchanged, persistent, has some radiation to the back and is not associated with any fevers, diarrhea, blood in the stools, hematuria, frequency or dysuria.  She does report a prior history of an abdominal wall hernia which was repaired as well as a history of having kidney stones removed in the past.  The patient is not very forthcoming with information, her friend in the room reports most of the other information.  Medical record was reviewed confirming the patient's medical history.  Looking back over the last several months the patient has had several evaluations 1 of which was from gastroenterology, she actually underwent EGD suspecting that she would have an esophageal stricture however it was found that she had some erosions from esophagitis from her acid as well as significant amounts of retained food in her stomach despite not eating for more than 12 hours.  Subsequently she underwent a gastric emptying study which showed significant delayed gastric emptying with only about 53% emptied at 4 hours.  Past Medical History:  Diagnosis Date  . Hypertension   . ILD (interstitial lung disease) ([place]) 02/08/2018  . Interstitial lung disease ([employer])   .  Multinodular thyroid    benign FNA 08/2017.  Marland Kitchen Scleroderma [facility])     Patient Active Problem List   Diagnosis Date Noted  . ILD (interstitial lung disease) ([place]) 02/08/2018  . GERD (gastroesophageal reflux disease) 12/18/2017  . Esophageal dysphagia 12/18/2017  . Scleroderma ([place]) 10/24/2017  . Multinodular goiter 08/30/2017  . Sjogren's syndrome ([place]) 08/30/2017  . HTN (hypertension) 03/23/2017    Past Surgical History:  Procedure Laterality Date  . ABDOMINAL HERNIA REPAIR    . ESOPHAGOGASTRODUODENOSCOPY (EGD) WITH PROPOFOL N/A 01/28/2018   Procedure: ESOPHAGOGASTRODUODENOSCOPY (EGD) WITH PROPOFOL;  Surgeon: Daneil Dolin, MD;  [facility];  Service: Endoscopy;  Laterality: N/A;  8:00am  . RIGHT HEART CATH N/A 09/27/2017   Procedure: RIGHT HEART CATH;  Surgeon: Jolaine Artist, MD;  Location: [facility] CV LAB;  Service: Cardiovascular;  Laterality: N/A;  . UTERINE FIBROID SURGERY       OB History   No obstetric history on file.      Home Medications    Prior to Admission medications   Medication Sig Start Date End Date Taking? Authorizing Provider  cephALEXin (KEFLEX) 500 MG capsule Take 1 capsule (500 mg total) by mouth 2 (two) times daily for 7 days. 05/10/18 05/17/18  Noemi Chapel, MD  chlorthalidone (HYGROTON) 25 MG tablet Take 25 mg by mouth daily.  07/25/17   [provider]  Cholecalciferol (VITAMIN D-3) 5000 units TABS Take by mouth daily.     [provider]  esomeprazole (NEXIUM) 40 MG capsule Take 40 mg by mouth 2 (two)  times daily. 01/28/18   [provider]  metoCLOPramide (REGLAN) 10 MG tablet Take 1 tablet (10 mg total) by mouth every 6 (six) hours. 05/10/18   Noemi Chapel, MD  mycophenolate (CELLCEPT) 500 MG tablet Tale 3 tablets in the AM (1500mg  total) and 3 tablets in the PM (1500mg ) 02/04/18   Mannam, Praveen, MD  Nebivolol HCl (BYSTOLIC) 20 MG TABS Take 20 mg by mouth daily as needed.     [provider]    NP THYROID 30 MG tablet Take 60 mg by mouth daily.  11/13/17   [provider]  ondansetron (ZOFRAN) 4 MG tablet Take 1 tablet (4 mg total) by mouth every 8 (eight) hours as needed for nausea or vomiting. 04/12/18   Mannam, Hart Robinsons, MD  spironolactone (ALDACTONE) 50 MG tablet Take 1 tablet (50 mg total) by mouth 2 (two) times daily. Patient taking differently: Take 50 mg by mouth daily.  09/26/17   Bensimhon, Shaune Pascal, MD  sucralfate (CARAFATE) 1 GM/10ML suspension Take 10 mLs (1 g total) by mouth 4 (four) times daily as needed (breakthrough heartburn symptoms). 04/12/18   Carlis Stable, NP  sulfamethoxazole-trimethoprim (BACTRIM DS) 800-160 MG tablet 1 tablet daily 10/23/17   Mannam, Hart Robinsons, MD  Vitamin D, Ergocalciferol, (DRISDOL) 1.25 MG (50000 UT) CAPS capsule Take 1 capsule by mouth once a week.    [provider]    Family History Family History  Problem Relation Age of Onset  . Hypertension Father   . Hypertension Sister   . Hypertension Brother   . Diabetes Maternal Grandfather   . Diabetes Maternal Uncle   . Colon cancer Neg Hx     Social History Social History   Tobacco Use  . Smoking status: Never Smoker  . Smokeless tobacco: Never Used  Substance Use Topics  . Alcohol use: No    Frequency: Never  . Drug use: No     Allergies   Lisinopril   Review of Systems Review of Systems  All other systems reviewed and are negative.    Physical Exam Updated Vital Signs BP (!) 127/106 (BP Location: Right Arm)   Pulse (!) 101   Temp 97.6 F (36.4 C) (Oral)   Resp 18   Ht 1.651 m (5\' 5" )   Wt 59 kg   SpO2 100%   BMI 21.63 kg/m   Physical Exam Vitals signs and nursing note reviewed.  Constitutional:      General: She is in acute distress.     Appearance: She is well-developed. She is diaphoretic.  HENT:     Head: Normocephalic and atraumatic.     Mouth/Throat:     Pharynx: No oropharyngeal exudate.  Eyes:     General: No scleral icterus.        Right eye: No discharge.        Left eye: No discharge.     Conjunctiva/sclera: Conjunctivae normal.     Pupils: Pupils are equal, round, and reactive to light.  Neck:     Musculoskeletal: Normal range of motion and neck supple.     Thyroid: No thyromegaly.     Vascular: No JVD.  Cardiovascular:     Rate and Rhythm: Normal rate and regular rhythm.     Heart sounds: Normal heart sounds. No murmur. No friction rub. No gallop.   Pulmonary:     Effort: Pulmonary effort is normal. No respiratory distress.     Breath sounds: Normal breath sounds. No wheezing or rales.  Abdominal:     General: Bowel sounds are normal. There is no distension.     Palpations: Abdomen is soft. There is no mass.     Tenderness: There is abdominal tenderness.     Comments: Mild diffuse abdominal pain but seems to be more in the periumbilical region.  Mild guarding, no peritoneal signs  Musculoskeletal: Normal range of motion.        General: No tenderness.  Lymphadenopathy:     Cervical: No cervical adenopathy.  Skin:    General: Skin is warm.     Findings: No erythema or rash.     Comments: Clammy  Neurological:     Mental Status: She is alert.     Coordination: Coordination normal.  Psychiatric:        Behavior: Behavior normal.      ED Treatments / Results  Labs (all labs ordered are listed, but only abnormal results are displayed) Labs Reviewed  CBC WITH DIFFERENTIAL/PLATELET - Abnormal; Notable for the following components:      Result Value   WBC 10.6 (*)    Neutro Abs 8.3 (*)    All other components within normal limits  COMPREHENSIVE METABOLIC PANEL - Abnormal; Notable for the following components:   Potassium 3.0 (*)    Glucose, Bld 129 (*)    Calcium 11.0 (*)    Total Protein 8.8 (*)    All other components within normal limits  URINALYSIS, ROUTINE W REFLEX MICROSCOPIC - Abnormal; Notable for the following components:   APPearance CLOUDY (*)    Specific Gravity, Urine 1.036 (*)     Ketones, ur 5 (*)    Protein, ur 30 (*)    Leukocytes, UA LARGE (*)    Bacteria, UA RARE (*)    All other components within normal limits  URINE CULTURE  LIPASE, BLOOD  HCG, QUANTITATIVE, PREGNANCY    EKG None  Radiology Ct Abdomen Pelvis W Contrast  Result Date: 05/10/2018 CLINICAL DATA:  Per ED notes: Pt here with n/v/d times 3 months , pt has a GI MD but was unable to been seen by them today EXAM: CT ABDOMEN AND PELVIS WITH CONTRAST TECHNIQUE: Multidetector CT imaging of the abdomen and pelvis was performed using the standard protocol following bolus administration of intravenous contrast. CONTRAST:  41mL ISOVUE-300 IOPAMIDOL (ISOVUE-300) INJECTION 61% COMPARISON:  12/28/2014 and 01/16/2014 FINDINGS: Lower chest: No acute findings. Lungs show peripheral interstitial thickening similar to the prior exam. Hepatobiliary: Liver is normal in size attenuation. No masses. There are multiple gallstones, stable from the prior CT, largest measuring 2.8 cm. No gallbladder wall thickening or pericholecystic inflammation. No bile duct dilation. Pancreas: Unremarkable. No pancreatic ductal dilatation or surrounding inflammatory changes. Spleen: Normal in size without focal abnormality. Adrenals/Urinary Tract: No adrenal masses. Kidneys normal size, orientation and position. There is symmetric renal enhancement excretion. Tiny stone noted in the lower pole the right kidney. Small stones noted in the upper and midpole of the left kidney. No masses. No hydronephrosis. Normal ureters. Bladder is unremarkable. Stomach/Bowel: Stomach is unremarkable. They are mildly distended loops of proximal a central small bowel, without a defined transition point. Small bowel and colon demonstrates scattered air-fluid levels. There is no small bowel or colonic wall thickening or adjacent inflammation. Normal appendix visualized. Vascular/Lymphatic: No significant vascular findings are present. No enlarged abdominal or pelvic  lymph nodes. Reproductive: Uterus is enlarged by multiple fibroids. Largest fibroid bulges the anterior mid to lower uterine segment. It measures 7.2 cm in  size. There is a posterior pedunculated fibroid measuring 5.1 cm in greatest dimension there are several other pedunculated fibroids. No ovarian masses. Other: No abdominal wall hernia.  No significant ascites. Musculoskeletal: No acute or significant osseous findings. IMPRESSION: 1. There are mildly dilated loops of small bowel without a transition point. There also small bowel and colonic air-fluid levels. Findings support a gastroenteritis. No convincing bowel obstruction. No bowel wall thickening or inflammation. 2. No other evidence of an acute abnormality. 3. Multiple gallstones similar to the prior CT. 4. Small nonobstructing intrarenal stones. 5. Multiple uterine fibroids. These have markedly increased in size when compared to the CT from 12/28/2014. Electronically Signed   By: Lajean Manes M.D.   On: 05/10/2018 16:11    Procedures Procedures (including critical care time)  Medications Ordered in ED Medications  ondansetron (ZOFRAN) injection 4 mg (4 mg Intravenous Given 05/10/18 1405)  HYDROmorphone (DILAUDID) injection 1 mg (1 mg Intravenous Given 05/10/18 1406)  sodium chloride 0.9 % bolus 1,000 mL (0 mLs Intravenous Stopped 05/10/18 1517)  iopamidol (ISOVUE-300) 61 % injection 80 mL (80 mLs Intravenous Contrast Given 05/10/18 1548)     Initial Impression / Assessment and Plan / ED Course  I have reviewed the triage vital signs and the nursing notes.  Pertinent labs & imaging results that were available during my care of the patient were reviewed by me and considered in my medical decision making (see chart for details).  Clinical Course as of May 10 1810  Fri May 10, 2018  1809 UA shows that she has 21-50 WBC, and while I don't think that this is the cause of her pain - it will need to be cultured - she has no sx at this time after  being given meds - she feels better and is amenable to d/c.  Start reglan for possible gastroparesis as the cause of her sx    [BM]    Clinical Course User Index [BM] Noemi Chapel, MD   The patient appears very uncomfortable, she will need pain medication nausea medication and evaluation for the cause of her pain which was acute in onset.  We will consider multiple etiologies including gallstone, kidney stone, bowel obstruction, perforation, less likely to be abdominal aortic aneurysm or appendicitis given the vague location of the pain.  This seems to be more colicky in nature.  CT scan ordered, patient agreeable to the plan.  Final Clinical Impressions(s) / ED Diagnoses   Final diagnoses:  Gastroparesis  Acute cystitis without hematuria    ED Discharge Orders         Ordered    metoCLOPramide (REGLAN) 10 MG tablet  Every 6 hours     05/10/18 1811    cephALEXin (KEFLEX) 500 MG capsule  2 times daily     05/10/18 1811           Noemi Chapel, MD 05/10/18 [phone]

## 2018-05-10 NOTE — ED Triage Notes (Signed)
Pt here with n/v/d times 3 months , pt has a GI MD but was unable to been seen by them today

## 2018-05-10 NOTE — ED Triage Notes (Signed)
Onset Abdominal pain today, Nausea and vomiting

## 2018-05-12 LAB — URINE CULTURE: Culture: >=100000 — AB

## 2018-05-13 ENCOUNTER — Telehealth: Payer: Self-pay | Admitting: *Deleted

## 2018-05-13 NOTE — Telephone Encounter (Signed)
Post ED Visit - Positive Culture Follow-up  Culture report reviewed by antimicrobial stewardship pharmacist:  []  Elenor Quinones, Pharm.D. []  Heide Guile, Pharm.D., BCPS AQ-ID []  Parks Neptune, Pharm.D., BCPS []  Alycia Rossetti, Pharm.D., BCPS []  [place], [place].D., BCPS, AAHIVP []  Legrand Como, Pharm.D., BCPS, AAHIVP [x]  Salome Arnt, PharmD, BCPS []  Johnnette Gourd, PharmD, BCPS []  Hughes Better, PharmD, BCPS []  Leeroy Cha, PharmD  Positive urine culture Treated with Cephalexin, organism sensitive to the same and no further patient follow-up is required at this time.  Harlon Flor [facility] 05/13/2018, 9:40 AM

## 2018-05-15 ENCOUNTER — Ambulatory Visit (HOSPITAL_COMMUNITY)
Admission: RE | Admit: 2018-05-15 | Discharge: 2018-05-15 | Disposition: A | Discharge status: Home / Self Care | Payer: 59 | Source: Ambulatory Visit | Attending: Internal Medicine | Admitting: Internal Medicine

## 2018-05-15 DIAGNOSIS — E041 Nontoxic single thyroid nodule: Secondary | ICD-10-CM | POA: Insufficient documentation

## 2018-05-16 DIAGNOSIS — N200 Calculus of kidney: Secondary | ICD-10-CM | POA: Diagnosis not present

## 2018-05-16 DIAGNOSIS — N3090 Cystitis, unspecified without hematuria: Secondary | ICD-10-CM | POA: Diagnosis not present

## 2018-05-16 DIAGNOSIS — K8020 Calculus of gallbladder without cholecystitis without obstruction: Secondary | ICD-10-CM | POA: Diagnosis not present

## 2018-05-16 DIAGNOSIS — R809 Proteinuria, unspecified: Secondary | ICD-10-CM | POA: Diagnosis not present

## 2018-05-16 NOTE — Progress Notes (Signed)
[facility] Clinic Note - Initial Visit   Date: 05/17/18  Karen Karen [name] MRN: [id number] DOB: 05/14/1970   Dear Dr. Trudie [name]:  Thank you for your kind referral of Karen Karen [name] for consultation of numbness of the feet. Although her history is well known to you, please allow [place] to reiterate it for the purpose of our medical record. The patient was accompanied to the clinic by mother who also provides collateral information.     History of Present Illness: Karen Karen [name] is a 48 y.o. right-handed African-American Karen [name] with scleroderma associated with interstitial lung disease and Raynauds, hypertension and GERD  presenting for evaluation of numbness of the feet.  She works as a Psychologist, sport and exercise at [employer].  Starting around late 2018, she began noticing tingling and numbness in the right toes, which then moved into her left toes, which is constant.  She is mostly bothered by numbness, less so tingling.  Symptoms are worse in the morning, because she feels that the toes will not weakness.  Nothing alleviates her symptoms.  She has mild imbalance, she has not suffered any falls and walk unassisted.  She has intermittent low back pain.  Paresthesias do not extend into the dorsum of the feet or lower legs. No hand paresthesias.    She was diagnosed with scleroderma in early 2019 after presenting to [facility] with hypertension and CT chest showed groundglass opacities concerning for interstitial fibrosis.  She was evaluated by rheumatology whose evaluation led to the diagnosis of scleroderma.  She takes CellCept 500 mg twice daily which has helped her shortness of breath.  She is followed by Dr. Vaughan Karen [name] and pulmonology and Dr. Trudie [name] in rheumatology.    Past Medical History:  Diagnosis Date  . Hypertension   . ILD (interstitial lung disease) ([place]) 02/08/2018  . Interstitial lung disease ([place])   . Multinodular thyroid    benign FNA 08/2017.  Marland Kitchen  Scleroderma [facility])     Past Surgical History:  Procedure Laterality Date  . ABDOMINAL HERNIA REPAIR    . ESOPHAGOGASTRODUODENOSCOPY (EGD) WITH PROPOFOL N/A 01/28/2018   Procedure: ESOPHAGOGASTRODUODENOSCOPY (EGD) WITH PROPOFOL;  Surgeon: Daneil Dolin, MD;  Location[facility];  Service: Endoscopy;  Laterality: N/A;  8:00am  . RIGHT HEART CATH N/A 09/27/2017   Procedure: RIGHT HEART CATH;  Surgeon: Jolaine Artist, MD;  Location: [facility] CV LAB;  Service: Cardiovascular;  Laterality: N/A;  . UTERINE FIBROID SURGERY       Medications:  Outpatient Encounter Medications as of 05/17/2018  Medication Sig  . cephALEXin (KEFLEX) 500 MG capsule Take 1 capsule (500 mg total) by mouth 2 (two) times daily for 7 days.  . chlorthalidone (HYGROTON) 25 MG tablet Take 25 mg by mouth daily.   . Cholecalciferol (VITAMIN D-3) 5000 units TABS Take by mouth daily.   Marland Kitchen esomeprazole (NEXIUM) 40 MG capsule Take 40 mg by mouth 2 (two) times daily.  . metoCLOPramide (REGLAN) 10 MG tablet Take 1 tablet (10 mg total) by mouth every 6 (six) hours.  . mycophenolate (CELLCEPT) 500 MG tablet Tale 3 tablets in the AM (1500mg  total) and 3 tablets in the PM (1500mg )  . Nebivolol HCl (BYSTOLIC) 20 MG TABS Take 20 mg by mouth daily as needed.   . NP THYROID 30 MG tablet Take 60 mg by mouth daily.   . ondansetron (ZOFRAN) 4 MG tablet Take 1 tablet (4 mg total) by mouth every 8 (eight) hours as needed for nausea or vomiting.  Marland Kitchen  spironolactone (ALDACTONE) 50 MG tablet Take 1 tablet (50 mg total) by mouth 2 (two) times daily. (Patient taking differently: Take 50 mg by mouth daily. )  . sucralfate (CARAFATE) 1 GM/10ML suspension Take 10 mLs (1 g total) by mouth 4 (four) times daily as needed (breakthrough heartburn symptoms).  Marland Kitchen sulfamethoxazole-trimethoprim (BACTRIM DS) 800-160 MG tablet 1 tablet daily  . Vitamin D, Ergocalciferol, (DRISDOL) 1.25 MG (50000 UT) CAPS capsule Take 1 capsule by mouth once a week.   No  facility-administered encounter medications on file as of 05/17/2018.      Allergies:  Allergies  Allergen Reactions  . Lisinopril Hives and Swelling    Family History: Family History  Problem Relation Age of Onset  . Hypertension Father   . Hypertension Sister   . Hypertension Brother   . Diabetes Maternal Grandfather   . Diabetes Maternal Uncle   . Diabetes Mother   . Colon cancer Neg Hx     Social History: Social History   Tobacco Use  . Smoking status: Never Smoker  . Smokeless tobacco: Never Used  Substance Use Topics  . Alcohol use: No    Frequency: Never  . Drug use: No   Social History   Social History Narrative   Patient is right-handed. She lives alone in one level home, a few steps to enter.    Review of Systems:  CONSTITUTIONAL: No fevers, chills, night sweats, or weight loss.   EYES: No visual changes or eye pain ENT: No hearing changes.  No history of nose bleeds.   RESPIRATORY: No cough, wheezing +shortness of breath.   CARDIOVASCULAR: Negative for chest pain, and palpitations.   GI: Negative for abdominal discomfort, blood in stools or black stools.  No recent change in bowel habits.   GU:  No history of incontinence.   MUSCLOSKELETAL: No history of joint pain or swelling.  No myalgias.   SKIN: Negative for lesions, rash, and itching.   HEMATOLOGY/ONCOLOGY: Negative for prolonged bleeding, bruising easily, and swollen nodes.  No history of cancer.   ENDOCRINE: Negative for cold or heat intolerance, polydipsia or goiter.   PSYCH:  No depression or anxiety symptoms.   NEURO: As Above.   Vital Signs:  BP 106/82   Pulse 91   Ht 5\' 5"  (1.651 m)   Wt 128 lb (58.1 kg)   SpO2 95%   BMI 21.30 kg/m    General Medical Exam:   General:  Well appearing, comfortable.   Eyes/ENT: see cranial nerve examination.   Neck: No masses appreciated.  Full range of motion without tenderness.  No carotid bruits. Respiratory:  Clear to auscultation, good air  entry bilaterally.   Cardiac:  Regular rate and rhythm, no murmur.   Extremities:  No deformities, edema, or skin discoloration.  Skin:  No rashes or lesions, slight thickening of skin of the face and legs..  Neurological Exam: MENTAL STATUS including orientation to time, place, person, recent and remote memory, attention span and concentration, language, and fund of knowledge is normal.  Speech is not dysarthric.  CRANIAL NERVES: II:  No visual field defects.  Unremarkable fundi.   III-IV-VI: Pupils equal round and reactive to light.  Normal conjugate, extra-ocular eye movements in all directions of gaze.  No nystagmus.  No ptosis.   V:  Normal facial sensation.     VII:  Normal facial symmetry and movements. VIII:  Normal hearing and vestibular function.   IX-X:  Normal palatal movement.   XI:  Normal shoulder shrug and head rotation.   XII:  Normal tongue strength and range of motion, no deviation or fasciculation.  MOTOR:  No atrophy, fasciculations or abnormal movements.  No pronator drift.  Tone is normal.    Right Upper Extremity:    Left Upper Extremity:    Deltoid  5/5   Deltoid  5/5   Biceps  5/5   Biceps  5/5   Triceps  5/5   Triceps  5/5   Wrist extensors  5/5   Wrist extensors  5/5   Wrist flexors  5/5   Wrist flexors  5/5   Finger extensors  5/5   Finger extensors  5/5   Finger flexors  5/5   Finger flexors  5/5   Dorsal interossei  5/5   Dorsal interossei  5/5   Abductor pollicis  5/5   Abductor pollicis  5/5   Tone (Ashworth scale)  0  Tone (Ashworth scale)  0   Right Lower Extremity:    Left Lower Extremity:    Hip flexors  5/5   Hip flexors  5/5   Hip extensors  5/5   Hip extensors  5/5   Knee flexors  5/5   Knee flexors  5/5   Knee extensors  5/5   Knee extensors  5/5   Dorsiflexors  4/5   Dorsiflexors  4/5   Plantarflexors  4/5   Plantarflexors  4/5   Toe extensors  4/5   Toe extensors  4/5   Toe flexors  4/5   Toe flexors  4/5   Tone (Ashworth scale)  0   Tone (Ashworth scale)  0   MSRs:  Right                                                                 Left brachioradialis 2+  brachioradialis 2+  biceps 2+  biceps 2+  triceps 2+  triceps 2+  patellar 2+  patellar 2+  ankle jerk 1+  ankle jerk 1+  Hoffman no  Hoffman no  plantar response down  plantar response down   SENSORY:  Vibration is reduced to 80% at the ankle and great toe bilaterally.  Pinprick and vibration is intact distally.  Romberg sign is negative.    COORDINATION/GAIT: Normal finger-to- nose-finger and heel-to-shin.  Intact rapid alternating movements bilaterally.  Able to rise from a chair without using arms.  Gait appears slow, wide-based, unassisted.  She is unable to perform stressed gait due to feet pain.  Tandem gait is intact.    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  Electrodiagnostic testing of the legs will be ordered to better characterize the nature of her symptoms.  I will also check vitamin B12, folate, copper, TSH for treatable causes of neuropathy.  We discussed the role of medication for painful paresthesias, however she reports greater numbness than tingling.  Unfortunately, medications are not effective in managing symptoms of numbness.  She was advised to check her feet daily and take precautions on uneven ground.  Return to clinic in 6 months.   Thank you for allowing me to participate in patient's care.  If I can answer any additional questions, I would be pleased to do so.  Sincerely,    Jaramie Bastos K. Posey Pronto, DO

## 2018-05-17 ENCOUNTER — Encounter

## 2018-05-17 ENCOUNTER — Encounter: Payer: Self-pay | Admitting: Neurology

## 2018-05-17 ENCOUNTER — Other Ambulatory Visit (INDEPENDENT_AMBULATORY_CARE_PROVIDER_SITE_OTHER): Payer: 59

## 2018-05-17 ENCOUNTER — Ambulatory Visit (INDEPENDENT_AMBULATORY_CARE_PROVIDER_SITE_OTHER): Payer: 59 | Admitting: Neurology

## 2018-05-17 VITALS — BP 106/82 | HR 91 | Ht 65.0 in | Wt 128.0 lb

## 2018-05-17 DIAGNOSIS — R202 Paresthesia of skin: Secondary | ICD-10-CM | POA: Diagnosis not present

## 2018-05-17 DIAGNOSIS — R6889 Other general symptoms and signs: Secondary | ICD-10-CM

## 2018-05-17 LAB — VITAMIN B12: Vitamin B-12: 305 pg/mL (ref 211–911)

## 2018-05-17 LAB — TSH: TSH: 0.26 u[IU]/mL — ABNORMAL LOW (ref 0.35–4.50)

## 2018-05-17 LAB — FOLATE: Folate: 6.7 ng/mL (ref >5.9)

## 2018-05-17 NOTE — Patient Instructions (Addendum)
NCS/EMG of the legs.  Do not apply lotion.  Check labs  If the tingling gets worse, let me know  Please inspect soles of feet daily  Take caution on uneven ground  Return to clinic 6 months   ELECTROMYOGRAM AND NERVE CONDUCTION STUDIES (EMG/NCS) INSTRUCTIONS  How to Prepare The neurologist conducting the EMG will need to know if you have certain medical conditions. Tell the neurologist and other EMG lab personnel if you: . Have a pacemaker or any other electrical medical device . Take blood-thinning medications . Have hemophilia, a blood-clotting disorder that causes prolonged bleeding Bathing Take a shower or bath shortly before your exam in order to remove oils from your skin. Don't apply lotions or creams before the exam.  What to Expect You'll likely be asked to change into a hospital gown for the procedure and lie down on an examination table. The following explanations can help you understand what will happen during the exam.  . Electrodes. The neurologist or a technician places surface electrodes at various locations on your skin depending on where you're experiencing symptoms. Or the neurologist may insert needle electrodes at different sites depending on your symptoms.  . Sensations. The electrodes will at times transmit a tiny electrical current that you may feel as a twinge or spasm. The needle electrode may cause discomfort or pain that usually ends shortly after the needle is removed. If you are concerned about discomfort or pain, you may want to talk to the neurologist about taking a short break during the exam.  . Instructions. During the needle EMG, the neurologist will assess whether there is any spontaneous electrical activity when the muscle is at rest - activity that isn't present in healthy muscle tissue - and the degree of activity when you slightly contract the muscle.  He or she will give you instructions on resting and contracting a muscle at appropriate times.  Depending on what muscles and nerves the neurologist is examining, he or she may ask you to change positions during the exam.  After your EMG You may experience some temporary, minor bruising where the needle electrode was inserted into your muscle. This bruising should fade within several days. If it persists, contact your primary care doctor.

## 2018-05-20 DIAGNOSIS — D259 Leiomyoma of uterus, unspecified: Secondary | ICD-10-CM | POA: Diagnosis not present

## 2018-05-20 DIAGNOSIS — M349 Systemic sclerosis, unspecified: Secondary | ICD-10-CM | POA: Insufficient documentation

## 2018-05-20 DIAGNOSIS — Z6820 Body mass index (BMI) 20.0-20.9, adult: Secondary | ICD-10-CM | POA: Diagnosis not present

## 2018-05-20 DIAGNOSIS — Z3169 Encounter for other general counseling and advice on procreation: Secondary | ICD-10-CM | POA: Diagnosis not present

## 2018-05-20 DIAGNOSIS — Z01419 Encounter for gynecological examination (general) (routine) without abnormal findings: Secondary | ICD-10-CM | POA: Diagnosis not present

## 2018-05-20 DIAGNOSIS — R61 Generalized hyperhidrosis: Secondary | ICD-10-CM | POA: Diagnosis not present

## 2018-05-20 DIAGNOSIS — Z1389 Encounter for screening for other disorder: Secondary | ICD-10-CM | POA: Diagnosis not present

## 2018-05-20 DIAGNOSIS — Z1231 Encounter for screening mammogram for malignant neoplasm of breast: Secondary | ICD-10-CM | POA: Diagnosis not present

## 2018-05-20 LAB — COPPER, SERUM: Copper: 130 ug/dL (ref 70–175)

## 2018-05-20 MED FILL — PARoxetine HCL 10 MG TABS: 10 | 30 days supply | Qty: 30 | Fill #0

## 2018-05-21 ENCOUNTER — Other Ambulatory Visit (INDEPENDENT_AMBULATORY_CARE_PROVIDER_SITE_OTHER): Payer: 59

## 2018-05-21 ENCOUNTER — Telehealth: Payer: Self-pay | Admitting: *Deleted

## 2018-05-21 ENCOUNTER — Other Ambulatory Visit: Payer: Self-pay | Admitting: *Deleted

## 2018-05-21 DIAGNOSIS — R7989 Other specified abnormal findings of blood chemistry: Secondary | ICD-10-CM

## 2018-05-21 LAB — T4, FREE: Free T4: 0.97 ng/dL (ref 0.60–1.60)

## 2018-05-21 LAB — T3, FREE: T3, Free: 3.9 pg/mL (ref 2.3–4.2)

## 2018-05-21 MED FILL — ESOMEPRAZOLE MAG DR 40 MG C: 40 | 30 days supply | Qty: 60 | Fill #1

## 2018-05-21 NOTE — Telephone Encounter (Signed)
Patient coming in today for lab work.

## 2018-05-21 NOTE — Telephone Encounter (Signed)
Left message for patient to call me back. 

## 2018-05-21 NOTE — Telephone Encounter (Signed)
-----   Message from Alda Berthold, DO sent at 05/20/2018  9:28 AM EST ----- Please call lab and see if they can add free T3/4 to blood work, since TSH is low. Thanks.

## 2018-05-30 ENCOUNTER — Ambulatory Visit (INDEPENDENT_AMBULATORY_CARE_PROVIDER_SITE_OTHER): Payer: 59 | Admitting: Neurology

## 2018-05-30 DIAGNOSIS — R202 Paresthesia of skin: Secondary | ICD-10-CM

## 2018-05-30 DIAGNOSIS — G63 Polyneuropathy in diseases classified elsewhere: Secondary | ICD-10-CM

## 2018-05-30 DIAGNOSIS — R6889 Other general symptoms and signs: Secondary | ICD-10-CM

## 2018-05-30 NOTE — Procedures (Signed)
[facility] Neurology  [place]  [place], [place] [address] Tel: [phone] Fax:  [phone] Test Date:  05/30/2018  Patient: Karen [name] DOB: [birth year] Physician: Narda Amber, DO  Sex: Female Height: 5\' 5"  Ref Phys: Narda Amber, DO  ID#: [id number] Temp: 31.0C Technician:    Patient Complaints: This is a 48 year old female with scleroderma, interstitial lung disease, and Raynard's phenomena referred for evaluation of bilateral feet numbness.  NCV & EMG Findings: Extensive electrodiagnostic testing of the right lower extremity and additional studies of the left shows:  1. Bilateral superficial peroneal and sural sensory responses are absent. 2. Bilateral peroneal motor responses are absent at the extensor digitorum brevis, and normal at the tibialis anterior.  Bilateral tibial motor responses are absent. 3. Bilateral tibial H reflex studies are within normal limits. 4. Chronic motor axonal loss changes are seen affecting the lower extremities conforming to a gradient pattern and worse distally.  There is no evidence of accompanied active denervation.  Impression: The electrophysiologic findings are most consistent with a chronic and symmetric sensorimotor axonal polyneuropathy affecting the lower extremities, which is severe in degree electrically.   ___________________________ Narda Amber, DO    Nerve Conduction Studies Anti Sensory Summary Table   Site NR Peak (ms) Norm Peak (ms) P-T Amp (V) Norm P-T Amp  Left Sup Peroneal Anti Sensory (Ant Lat Mall)  31C  12 cm NR  <4.6  >4  Right Sup Peroneal Anti Sensory (Ant Lat Mall)  31C  12 cm NR  <4.6  >4  Left Sural Anti Sensory (Lat Mall)  31C  Calf NR  <4.6  >4  Right Sural Anti Sensory (Lat Mall)  31C  Calf NR  <4.6  >4   Motor Summary Table   Site NR Onset (ms) Norm Onset (ms) O-P Amp (mV) Norm O-P Amp Site1 Site2 Delta-0 (ms) Dist (cm) Vel (m/s) Norm Vel (m/s)  Left Peroneal Motor (Ext  Dig Brev)  31C  Ankle NR  <6.0  >2.5 B Fib Ankle  0.0  >40  B Fib NR     Poplt B Fib  0.0  >40  Poplt NR            Right Peroneal Motor (Ext Dig Brev)  31C  Ankle NR  <6.0  >2.5 B Fib Ankle  0.0  >40  B Fib NR     Poplt B Fib  0.0  >40  Poplt NR            Left Peroneal TA Motor (Tib Ant)  31C  Fib Head    2.3 <4.5 4.6 >3 Poplit Fib Head 1.1 7.0 64 >40  Poplit    3.4  4.4         Right Peroneal TA Motor (Tib Ant)  31C  Fib Head    3.0 <4.5 4.1 >3 Poplit Fib Head 1.1 7.0 64 >40  Poplit    4.1  4.1         Left Tibial Motor (Abd Hall Brev)  31C  Ankle NR  <6.0  >4 Knee Ankle  0.0  >40  Knee NR            Right Tibial Motor (Abd Hall Brev)  31C  Ankle NR  <6.0  >4 Knee Ankle  0.0  >40  Knee NR             H Reflex Studies   NR H-Lat (ms) Lat Norm (ms) L-R H-Lat (ms)  Left Tibial (Gastroc)  31C     30.75 <35 0.00  Right Tibial (Gastroc)  31C     30.75 <35 0.00   EMG   Side Muscle Ins Act Fibs Psw Fasc Number Recrt Dur Dur. Amp Amp. Poly Poly. Comment  Left BicepsFemS Nml Nml Nml Nml 1- Rapid Few 1+ Few 1+ Few 1+ N/A  Left AntTibialis Nml Nml Nml Nml 1- Rapid Some 1+ Some 1+ Some 1+ N/A  Left Gastroc Nml Nml Nml Nml 1- Rapid Some 1+ Some 1+ Some 1+ N/A  Left Flex Dig Long Nml Nml Nml Nml 2- Rapid Many 1+ Many 1+ Many 1+ N/A  Left RectFemoris Nml Nml Nml Nml Nml Nml Nml Nml Nml Nml Nml Nml N/A  Left GluteusMed Nml Nml Nml Nml Nml Nml Nml Nml Nml Nml Nml Nml N/A  Right AntTibialis Nml Nml Nml Nml 1- Rapid Some 1+ Some 1+ Some 1+ N/A  Right BicepsFemS Nml Nml Nml Nml 1- Rapid Some 1+ Some 1+ Some 1+ N/A  Right Gastroc Nml Nml Nml Nml 1- Rapid Some 1+ Some 1+ Some 1+ N/A  Right Flex Dig Long Nml Nml Nml Nml 2- Rapid Many 1+ Many 1+ Many 1+ N/A  Right RectFemoris Nml Nml Nml Nml Nml Nml Nml Nml Nml Nml Nml Nml N/A  Right GluteusMed Nml Nml Nml Nml Nml Nml Nml Nml Nml Nml Nml Nml N/A      Waveforms:

## 2018-06-06 ENCOUNTER — Ambulatory Visit (INDEPENDENT_AMBULATORY_CARE_PROVIDER_SITE_OTHER): Payer: 59 | Admitting: *Deleted

## 2018-06-06 DIAGNOSIS — R809 Proteinuria, unspecified: Secondary | ICD-10-CM | POA: Diagnosis not present

## 2018-06-06 DIAGNOSIS — I10 Essential (primary) hypertension: Secondary | ICD-10-CM | POA: Diagnosis not present

## 2018-06-06 DIAGNOSIS — E041 Nontoxic single thyroid nodule: Secondary | ICD-10-CM | POA: Diagnosis not present

## 2018-06-06 DIAGNOSIS — J849 Interstitial pulmonary disease, unspecified: Secondary | ICD-10-CM | POA: Diagnosis not present

## 2018-06-06 DIAGNOSIS — R5383 Other fatigue: Secondary | ICD-10-CM | POA: Diagnosis not present

## 2018-06-06 NOTE — Progress Notes (Signed)
SIX MIN WALK 06/06/2018 02/04/2018 10/23/2017  Medications None None Pt states that she took all of medications but could not tell me which ones  Supplimental Oxygen during Test? (L/min) No No No  Laps 6 5 3   Partial Lap (in Meters) 8 9 0  Baseline BP (sitting) 130/68 110/70 118/64  Baseline Heartrate 97 87 85  Baseline Dyspnea (Borg Scale) 3 0 3  Baseline Fatigue (Borg Scale) 2 3 7   Baseline SPO2 99 98 97  BP (sitting) 126/70 118/78 124/76  Heartrate 86 101 94  Dyspnea (Borg Scale) 6 4 6   Fatigue (Borg Scale) 6 4 8.5  SPO2 92 99 91  BP (sitting) 124/68 98/72 114/70  Heartrate 60 82 76  SPO2 93 92 100  Stopped or Paused before Six Minutes Yes Yes Yes  Other Symptoms at end of Exercise stopped a total of 4 times due to SOB and fatigue. stopped at 4:11, 3:30, 2:00, and 36 secs TIred and feeling SOB. Patient had to stop twice. The first time for 57 seconds and the 2nd time for 30 seconds. Walked a slow pace.  Pt complained of being very fatigued and short of breath.  Distance Completed 212 249 144  Tech Comments: pt steady walk but stopped 4 times to rest, did not desat under 88% TA/CMA - -

## 2018-06-10 DIAGNOSIS — I10 Essential (primary) hypertension: Secondary | ICD-10-CM | POA: Diagnosis not present

## 2018-06-10 DIAGNOSIS — R809 Proteinuria, unspecified: Secondary | ICD-10-CM | POA: Diagnosis not present

## 2018-06-10 MED FILL — SPIRONOLACTONE 50 MG TABS: 50 | 30 days supply | Qty: 60 | Fill #3

## 2018-06-10 MED FILL — CHLORTHALIDONE 25 MG TABS: 25 | 30 days supply | Qty: 30 | Fill #1

## 2018-06-10 MED FILL — NP THYROID 90 MG TABLET: 90 | 30 days supply | Qty: 30 | Fill #1

## 2018-06-12 DIAGNOSIS — D485 Neoplasm of uncertain behavior of skin: Secondary | ICD-10-CM | POA: Insufficient documentation

## 2018-06-12 DIAGNOSIS — L819 Disorder of pigmentation, unspecified: Secondary | ICD-10-CM | POA: Diagnosis not present

## 2018-06-12 DIAGNOSIS — L668 Other cicatricial alopecia: Secondary | ICD-10-CM | POA: Diagnosis not present

## 2018-06-12 DIAGNOSIS — L309 Dermatitis, unspecified: Secondary | ICD-10-CM | POA: Diagnosis not present

## 2018-06-12 DIAGNOSIS — L2084 Intrinsic (allergic) eczema: Secondary | ICD-10-CM | POA: Insufficient documentation

## 2018-06-12 DIAGNOSIS — L219 Seborrheic dermatitis, unspecified: Secondary | ICD-10-CM | POA: Diagnosis not present

## 2018-06-12 DIAGNOSIS — L650 Telogen effluvium: Secondary | ICD-10-CM | POA: Insufficient documentation

## 2018-06-12 DIAGNOSIS — M349 Systemic sclerosis, unspecified: Secondary | ICD-10-CM | POA: Diagnosis not present

## 2018-06-12 DIAGNOSIS — L659 Nonscarring hair loss, unspecified: Secondary | ICD-10-CM | POA: Diagnosis not present

## 2018-06-12 MED FILL — TRIAMCINOLONE 0.1% OINTMENT: 0.1 | 30 days supply | Qty: 454 | Fill #0

## 2018-06-12 MED FILL — TACROLIMUS 0.1 % OINT: 0.1 | 30 days supply | Qty: 60 | Fill #0

## 2018-06-17 ENCOUNTER — Encounter: Payer: Self-pay | Admitting: Pulmonary Disease

## 2018-06-17 ENCOUNTER — Ambulatory Visit (INDEPENDENT_AMBULATORY_CARE_PROVIDER_SITE_OTHER): Payer: 59 | Admitting: Pulmonary Disease

## 2018-06-17 VITALS — BP 114/62 | HR 95 | Ht 65.0 in | Wt 128.6 lb

## 2018-06-17 DIAGNOSIS — Z5181 Encounter for therapeutic drug level monitoring: Secondary | ICD-10-CM

## 2018-06-17 DIAGNOSIS — J849 Interstitial pulmonary disease, unspecified: Secondary | ICD-10-CM | POA: Diagnosis not present

## 2018-06-17 DIAGNOSIS — M349 Systemic sclerosis, unspecified: Secondary | ICD-10-CM | POA: Diagnosis not present

## 2018-06-17 LAB — CBC
HCT: 38.9 % (ref 36.0–46.0)
Hemoglobin: 12.7 g/dL (ref 12.0–15.0)
MCHC: 32.7 g/dL (ref 30.0–36.0)
MCV: 86.5 fl (ref 78.0–100.0)
PLATELETS: 255 10*3/uL (ref 150.0–400.0)
RBC: 4.5 Mil/uL (ref 3.87–5.11)
RDW: 13.1 % (ref 11.5–15.5)
WBC: 8.6 10*3/uL (ref 4.0–10.5)

## 2018-06-17 LAB — COMPREHENSIVE METABOLIC PANEL
ALT: 10 U/L (ref 0–35)
AST: 21 U/L (ref 0–37)
Albumin: 4.4 g/dL (ref 3.5–5.2)
Alkaline Phosphatase: 72 U/L (ref 39–117)
BUN: 16 mg/dL (ref 6–23)
CALCIUM: 10.4 mg/dL (ref 8.4–10.5)
CHLORIDE: 101 meq/L (ref 96–112)
CO2: 28 mEq/L (ref 19–32)
Creatinine, Ser: 0.62 mg/dL (ref 0.40–1.20)
GFR: 123.23 mL/min (ref >60.00)
Glucose, Bld: 87 mg/dL (ref 70–99)
Potassium: 3.8 mEq/L (ref 3.5–5.1)
Sodium: 136 mEq/L (ref 135–145)
Total Bilirubin: 0.4 mg/dL (ref 0.2–1.2)
Total Protein: 8.2 g/dL (ref 6.0–8.3)

## 2018-06-17 NOTE — Patient Instructions (Signed)
Get CBC and comprehensive metabolic panel today We will reschedule the pulmonary function test and schedule her for high-resolution CT for follow-up of interstitial lung disease We will start paperwork for ofev 150 mg twice daily next  I will see you back in clinic in 2 weeks.

## 2018-06-17 NOTE — Addendum Note (Signed)
Addended by: Karmen Stabs on: 06/17/2018 02:45 PM   Modules accepted: Orders

## 2018-06-17 NOTE — Addendum Note (Signed)
Addended by: Suzzanne Cloud E on: 06/17/2018 02:49 PM   Modules accepted: Orders

## 2018-06-17 NOTE — Progress Notes (Signed)
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 Haroldine Laws on 09/27/17 with no evidence of pulmonary hypertension Started on CellCept May 2019 and uptitrated to max dose of 1.5 mg twice daily.  She had a hospitalization in [facility] in March 2019 for hypertension.  A CT chest scan at that time showed subtle lower lung findings of atelectasis, groundglass.  She has a right thyroid nodule which was biopsied on 08/15/17 with pathology showing benign findings.  Thyroid function tests are normal.  Reviewed note from Dr. Trudie Reed, rheumatology 07/12/2017 Seen for joint pain, Raynaud's, elevated CK, skin thickening Serologies positive for Sjogren's negative,, normal CK and aldolase.  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  No mold. No hot tub, Jacuzzi. She has down comforter.  Smoking history: Never smoker Travel history: Grew up in [place].  Lived in [place] and [place] Relevant family history: No family history of lung disease.    Interim history: Continues on CellCept 1.5 mg twice daily.   Stable respiratory status.  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 Seen by  neurology and diagnosed with peripheral neuropathy which is thought secondary to scleroderma.  Outpatient Encounter Medications as of 06/17/2018  Medication Sig  . chlorthalidone (HYGROTON) 25 MG tablet Take 25 mg by mouth daily.   . Cholecalciferol (VITAMIN D-3) 5000 units TABS Take by mouth daily.   Marland Kitchen esomeprazole (NEXIUM) 40 MG capsule Take 40 mg by mouth 2 (two) times daily.  . metoCLOPramide (REGLAN) 10 MG tablet Take 1 tablet (10 mg total) by mouth every 6 (six) hours.  . mycophenolate (CELLCEPT) 500 MG tablet Tale 3 tablets in the AM (1500mg  total) and 3 tablets in the PM (1500mg )  . Nebivolol HCl (BYSTOLIC) 20 MG TABS Take 20 mg by mouth daily as needed.   . NP THYROID 30 MG tablet Take 60 mg by mouth daily.   . ondansetron (ZOFRAN) 4 MG tablet Take 1 tablet (4 mg total) by mouth every 8 (eight) hours as needed for nausea or vomiting.  Marland Kitchen spironolactone (ALDACTONE) 50 MG tablet Take 1 tablet (50 mg total) by mouth 2 (two) times daily. (Patient taking differently: Take 50 mg by mouth daily. )  . sucralfate (CARAFATE) 1 GM/10ML suspension Take 10 mLs (1 g total) by mouth 4 (four) times daily as needed (breakthrough heartburn symptoms).  Marland Kitchen sulfamethoxazole-trimethoprim (BACTRIM DS) 800-160 MG tablet 1 tablet daily  . Vitamin D, Ergocalciferol, (DRISDOL) 1.25 MG (50000 UT) CAPS capsule Take 1 capsule by mouth once a week.   No facility-administered encounter medications on file as of 06/17/2018.     Physical Exam: Blood pressure 114/62, pulse 95, height 5\' 5"  (1.651 m), weight 128 lb 9.6 oz (58.3 kg), SpO2 100 %. Gen:  No acute distress HEENT:  EOMI, sclera anicteric Neck:     No masses; no thyromegaly Lungs:    Clear to auscultation bilaterally; normal respiratory effort CV:         Regular rate and rhythm; no murmurs Abd:      + bowel sounds; soft, non-tender; no palpable masses, no distension Ext:    No edema; adequate peripheral perfusion Skin:      Warm and dry; no rash Neuro:  alert and oriented x 3 Psych: normal mood and affect  Data Reviewed: Imaging CT chest, [facility] 07/24/2017 Dependent atelectasis, subtle groundglass opacities at the base.  2.5 cm right thyroid nodule.  I have reviewed the images personally  CT high-resolution 09/18/2017- patchy reticular groundglass opacities, reticulation with subpleural sparing.  Mild traction bronchiectasis.  No honeycombing.  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  Aortic atherosclerosis, three-vessel coronary artery disease. I have reviewed the images personally.  PFTs FENO 09/06/2017-unable to complete  07/27/2017 FVC 1.65 (54%), FEV1 1.53 (62%], F/F 93, TLC 50, DLCO 44%, DLCO/VA 131% Severe restriction and diffusion impairment.  10/25/2017 FVC 1.51 [50%], FEV1 1.26 [51%], F/F 83 Severe restriction, unable to complete DLCO  01/29/2018 FVC 1.69 [5%), FEV1 1.50 [61%], F/F 89, DLCO 10.57 [41%] Severe restriction and diffusion impairment.  6-minute walk  10/23/2017- 144 m Post walk heart rate, stats 94, 91%  02/04/2018- 249 m Post walk heart rate, sats 101, 99%  06/06/2018-212 m Post walk heart rate, sats 86, 92%  Labs 08/20/2017-ANA, centromere, rheumatoid factor, SCL 70-negative QuantiFERON 09/12/2017-negative Hepatitis B, C screening 09/21/2017-negative G6PD 09/25/2017-13  CBC, hepatic function panel 01/14/2018-within normal limits.  Cardiac Cardiac cath 09/27/17 RA = 2 RV = 33/6 PA = 32/10 (19) PCW = 6 Fick cardiac output/index = 5.0/3.0 PVR = 2.6 Ao sat = 99% PA sat = 74%, 75%  Assessment:  Scleroderma ILD Reviewed the CT scan which shows NSIP fibrosis which is consistent with scleroderma related interstitial lung disease. Although the ILD appears mild there is significant reduction in her PFTs with restriction and diffusion impairment. Review of her repeat CT scan, 6-minute walk test and PFTs show stable lung  function.  Currently on CellCept 1.5 g in the morning, 1 g at night.  Will increase it to 1.5 g twice daily Continue Bactrim prophylaxis   Her improvement appears to have plateaued off.  6-minute walk test was slightly lower We will recheck high-resolution CT and spirometry, diffusion capacity Start paperwork for Ofev therapy based on results of recent SENSICUS trial.  Https://www.nejm.org/doi/10.1056/NEJMoa1903076  Esophageal dysfunction GERD Continue on Protonix 20 mg twice daily Follows with [facility] gastroenterology She is due to get dilatation of esophageal stricture in the near future.  Plan/Recommendations: - Continue CellCept at current dose of 1.5 twice daily. - Bactrim prophylaxis. - Check CBC, CMP - Start Ofev - High-resolution CT, spirometry, diffusion capacity  Marshell Garfinkel MD  Pulmonary and Critical Care 06/17/2018, 2:09 PM

## 2018-06-19 ENCOUNTER — Telehealth: Payer: Self-pay | Admitting: Pulmonary Disease

## 2018-06-19 NOTE — Telephone Encounter (Signed)
lmom 

## 2018-06-20 NOTE — Telephone Encounter (Signed)
Notes recorded by Marshell Garfinkel, MD on 06/17/2018 at 4:51 PM EST Please let patient know labs are normal. -------------------------------- LMTCB x2 for pt.

## 2018-06-21 NOTE — Telephone Encounter (Signed)
ATC pt, no answer. Left message for pt to call back.  

## 2018-06-24 NOTE — Telephone Encounter (Signed)
I have left a detailed message on the pt's voicemail making her aware of her results. Nothing further was needed.

## 2018-06-26 DIAGNOSIS — M349 Systemic sclerosis, unspecified: Secondary | ICD-10-CM | POA: Diagnosis not present

## 2018-06-26 DIAGNOSIS — L669 Cicatricial alopecia, unspecified: Secondary | ICD-10-CM | POA: Diagnosis not present

## 2018-06-26 DIAGNOSIS — Z4802 Encounter for removal of sutures: Secondary | ICD-10-CM | POA: Diagnosis not present

## 2018-06-26 DIAGNOSIS — L089 Local infection of the skin and subcutaneous tissue, unspecified: Secondary | ICD-10-CM | POA: Diagnosis not present

## 2018-06-26 DIAGNOSIS — L658 Other specified nonscarring hair loss: Secondary | ICD-10-CM | POA: Diagnosis not present

## 2018-06-26 DIAGNOSIS — L650 Telogen effluvium: Secondary | ICD-10-CM | POA: Diagnosis not present

## 2018-06-26 MED FILL — CLOBETASOL PROPIONATE 0.05: 0.05 | 30 days supply | Qty: 45 | Fill #0

## 2018-06-27 ENCOUNTER — Ambulatory Visit (INDEPENDENT_AMBULATORY_CARE_PROVIDER_SITE_OTHER)
Admission: RE | Admit: 2018-06-27 | Discharge: 2018-06-27 | Disposition: A | Discharge status: Home / Self Care | Payer: 59 | Source: Ambulatory Visit | Attending: Pulmonary Disease | Admitting: Pulmonary Disease

## 2018-06-27 DIAGNOSIS — J849 Interstitial pulmonary disease, unspecified: Secondary | ICD-10-CM | POA: Diagnosis not present

## 2018-06-28 ENCOUNTER — Ambulatory Visit: Payer: 59 | Admitting: Gastroenterology

## 2018-07-01 ENCOUNTER — Ambulatory Visit (INDEPENDENT_AMBULATORY_CARE_PROVIDER_SITE_OTHER): Payer: 59 | Admitting: Pulmonary Disease

## 2018-07-01 ENCOUNTER — Encounter: Payer: Self-pay | Admitting: Pulmonary Disease

## 2018-07-01 ENCOUNTER — Encounter: Payer: Self-pay | Admitting: *Deleted

## 2018-07-01 VITALS — BP 116/70 | HR 101 | Ht 65.0 in | Wt 131.0 lb

## 2018-07-01 DIAGNOSIS — M349 Systemic sclerosis, unspecified: Secondary | ICD-10-CM | POA: Diagnosis not present

## 2018-07-01 DIAGNOSIS — Z5181 Encounter for therapeutic drug level monitoring: Secondary | ICD-10-CM | POA: Diagnosis not present

## 2018-07-01 DIAGNOSIS — J849 Interstitial pulmonary disease, unspecified: Secondary | ICD-10-CM | POA: Diagnosis not present

## 2018-07-01 LAB — PULMONARY FUNCTION TEST
DL/VA % pred: 137 %
DL/VA: 5.89 ml/min/mmHg/L
DLCO UNC: 12.54 ml/min/mmHg
DLCO cor % pred: 58 %
DLCO cor: 12.82 ml/min/mmHg
DLCO unc % pred: 57 %
FEF 25-75 PRE: 2.62 L/s
FEF2575-%PRED-PRE: 103 %
FEV1-%Pred-Pre: 60 %
FEV1-Pre: 1.47 L
FEV1FVC-%Pred-Pre: 114 %
FEV6-%Pred-Pre: 53 %
FEV6-PRE: 1.57 L
FEV6FVC-%Pred-Pre: 102 %
FVC-%Pred-Pre: 52 %
FVC-Pre: 1.57 L
Pre FEV1/FVC ratio: 93 %
Pre FEV6/FVC Ratio: 100 %

## 2018-07-01 NOTE — Patient Instructions (Signed)
Glad you are stable with the breathing CT scan and pulmonary function test looks stable We will give a letter stating that we recommend part-time job indefinitely  We will start you on a medication called ofev Follow-up in 1 month We will also refer you for lung transplant evaluation at [facility]

## 2018-07-01 NOTE — Progress Notes (Signed)
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 Haroldine Laws on 09/27/17 with no evidence of pulmonary hypertension Started on CellCept May 2019 and uptitrated to max dose of 1.5 mg twice daily.  She had a hospitalization in [facility] in March 2019 for hypertension.  A CT chest scan at that time showed subtle lower lung findings of atelectasis, groundglass.  She has a right thyroid nodule which was biopsied on 08/15/17 with pathology showing benign findings.  Thyroid function tests are normal.  Reviewed note from Dr. Trudie Reed, rheumatology 07/12/2017 Seen for joint pain, Raynaud's, elevated CK, skin thickening Serologies positive for Sjogren's negative,, normal CK and aldolase.  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  No mold. No hot tub, Jacuzzi. She has down comforter.  Smoking history: Never smoker Travel history: Grew up in [place].  Lived in [place] and [place] Relevant family history: No family history of lung disease.    Interim history: Continues on CellCept 1.5 mg twice daily.   Stable respiratory status.  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 Seen by  neurology and diagnosed with peripheral neuropathy which is thought secondary to scleroderma.  Outpatient Encounter Medications as of 07/01/2018  Medication Sig  . chlorthalidone (HYGROTON) 25 MG tablet Take 25 mg by mouth daily.   . Cholecalciferol (VITAMIN D-3) 5000 units TABS Take by mouth daily.   Marland Kitchen esomeprazole (NEXIUM) 40 MG capsule Take 40 mg by mouth 2 (two) times daily.  . Multiple Vitamins-Minerals (MEGA MULTIVITAMIN) POWD Take 1 Scoop by mouth daily.  . mycophenolate (CELLCEPT) 500 MG tablet Tale 3 tablets in the AM (1500mg  total) and 3 tablets in the PM (1500mg )  . NP THYROID 90 MG tablet   . spironolactone (ALDACTONE) 50 MG tablet Take 1 tablet (50 mg total) by mouth 2 (two) times daily. (Patient taking differently: Take 50 mg by mouth daily. )  . sulfamethoxazole-trimethoprim (BACTRIM DS) 800-160 MG tablet 1 tablet daily  . [DISCONTINUED] metoCLOPramide (REGLAN) 10 MG tablet Take 1 tablet (10 mg total) by mouth every 6 (six) hours.  . [DISCONTINUED] Nebivolol HCl (BYSTOLIC) 20 MG TABS Take 20 mg by mouth daily as needed.   . [DISCONTINUED] NP THYROID 30 MG tablet Take 60 mg by mouth daily.   . [DISCONTINUED] ondansetron (ZOFRAN) 4 MG tablet Take 1 tablet (4 mg total) by mouth every 8 (eight) hours as needed for nausea or vomiting.  . [DISCONTINUED] sucralfate (CARAFATE) 1 GM/10ML suspension Take 10 mLs (1 g total) by mouth 4 (four) times daily as needed (breakthrough heartburn symptoms).  . [DISCONTINUED] Vitamin D, Ergocalciferol, (DRISDOL) 1.25 MG (50000 UT) CAPS capsule Take 1 capsule by mouth once a week.   No facility-administered encounter medications on file as of 07/01/2018.     Physical  Exam: Blood pressure 114/62, pulse 95, height 5\' 5"  (1.651 m), weight 128 lb 9.6 oz (58.3 kg), SpO2 100 %. Gen:      No acute distress HEENT:  EOMI, sclera anicteric Neck:     No masses; no thyromegaly Lungs:    Clear to auscultation bilaterally; normal respiratory effort CV:          Regular rate and rhythm; no murmurs Abd:      + bowel sounds; soft, non-tender; no palpable masses, no distension Ext:    No edema; adequate peripheral perfusion Skin:      Warm and dry; no rash Neuro: alert and oriented x 3 Psych: normal mood and affect  Data Reviewed: Imaging CT chest, [facility] 07/24/2017 Dependent atelectasis, subtle groundglass opacities at the base.  2.5 cm right thyroid nodule.  I have reviewed the images personally  CT high-resolution 09/18/2017- patchy reticular groundglass opacities, reticulation with subpleural sparing.  Mild traction bronchiectasis.  No honeycombing.  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  Aortic atherosclerosis, three-vessel coronary artery disease.  CT high-resolution 06/27/2018-stable interstitial lung disease. I reviewed the images personally.  PFTs FENO 09/06/2017-unable to complete  07/27/2017 FVC 1.65 (54%), FEV1 1.53 (62%], F/F 93, TLC 50, DLCO 44%, DLCO/VA 131% Severe restriction and diffusion impairment.  10/25/2017 FVC 1.51 [50%], FEV1 1.26 [51%], F/F 83 Severe restriction, unable to complete DLCO  01/29/2018 FVC 1.69 [5%), FEV1 1.50 [61%], F/F 89, DLCO 10.57 [41%] Severe restriction and diffusion impairment.  07/01/2018 FVC 1.57 [52%), FEV1 1.47 [60%], F/F 93, DLCO 12.54 [59%)  6-minute walk  10/23/2017- 144 m Post walk heart rate, stats 94, 91%  02/04/2018- 249 m Post walk heart rate, sats 101, 99%  06/06/2018-212 m Post walk heart rate, sats 86, 92%  Labs 08/20/2017-ANA, centromere, rheumatoid factor, SCL 70-negative QuantiFERON 09/12/2017-negative Hepatitis B, C screening 09/21/2017-negative G6PD 09/25/2017-13  CBC, hepatic function panel 06/17/2018-within normal limits.  Cardiac Cardiac cath 09/27/17 RA = 2 RV = 33/6 PA = 32/10 (19) PCW = 6 Fick cardiac output/index = 5.0/3.0 PVR = 2.6 Ao sat = 99% PA sat = 74%,  75%  Assessment:  Scleroderma ILD Reviewed the CT scan which shows NSIP fibrosis which is consistent with scleroderma related interstitial lung disease. Although the ILD appears mild there is significant reduction in her PFTs with restriction and diffusion impairment. Review of her repeat CT scan, 6-minute walk test and PFTs show stable lung function.  Currently on CellCept 1.5 g in the morning, 1 g at night.  Will increase it to 1.5 g twice daily Continue Bactrim prophylaxis   Clinically improvement appears to have plateaued off.  6-minute walk test was slightly lower Start paperwork for Ofev therapy based on results of recent SENSICUS trial.  Https://www.nejm.org/doi/10.1056/NEJMoa1903076  Refer to lung transplantation at [facility]  Esophageal dysfunction GERD Continue on Protonix 20 mg twice daily Follows with [facility] gastroenterology She is due to get dilatation of esophageal stricture in the near future.  Plan/Recommendations: - Continue CellCept at current dose of 1.5 twice daily. - Bactrim prophylaxis. - Start Ofev - Referral for lung transplantation.  Marshell Garfinkel MD [place] Pulmonary and Critical Care 07/01/2018, 4:58 PM

## 2018-07-01 NOTE — Progress Notes (Signed)
Spirometry and DLCO performed today. °

## 2018-07-02 ENCOUNTER — Telehealth: Payer: Self-pay | Admitting: Pulmonary Disease

## 2018-07-02 NOTE — Telephone Encounter (Signed)
Pt seen at office yesterday, 07/01/2018 by Dr. Vaughan Browner. Application was filled out for [facility] enrollment. Application was faxed to [employer] for the Patient Assistance and to [place] and a confirmation fax was received that the fax went through.  Routing to [place] as an FYI and to follow up on status. Pt does know that it could take around 2-4 weeks or so for process of approval of medication.

## 2018-07-03 ENCOUNTER — Ambulatory Visit: Payer: 59 | Admitting: Gastroenterology

## 2018-07-03 ENCOUNTER — Encounter: Payer: Self-pay | Admitting: Gastroenterology

## 2018-07-03 VITALS — BP 112/81 | HR 102 | Temp 97.5°F | Ht 65.0 in | Wt 131.0 lb

## 2018-07-03 DIAGNOSIS — K219 Gastro-esophageal reflux disease without esophagitis: Secondary | ICD-10-CM

## 2018-07-03 DIAGNOSIS — K3184 Gastroparesis: Secondary | ICD-10-CM | POA: Diagnosis not present

## 2018-07-03 MED ORDER — METOCLOPRAMIDE HCL 5 MG PO TABS: 5.0000 mg | ORAL_TABLET | Freq: Two times a day (BID) | ORAL | 3 refills | Status: DC

## 2018-07-03 MED FILL — METOCLOPRAMIDE 5 MG TABLET: 5 | 30 days supply | Qty: 60 | Fill #0 | Status: TO

## 2018-07-03 NOTE — Progress Notes (Signed)
cc'ed to pcp °

## 2018-07-03 NOTE — Progress Notes (Signed)
Referring Provider: [name], MD Primary Care Physician:  [name], MD Primary GI: Dr. [name]  Chief Complaint  Patient presents with  . Gastroesophageal Reflux    HPI:   Karen [name] is a 48 y.o. female presenting today with a history of GERD and dysphagia. EGD completed in interim from last visit with erosive reflux esophagitis, patulous EG Junction, no dilation, incomplete EGD due to retained food in stomach. GES thereafter with delayed gastric emptying.    149 in April 2017, 129 in Aug 2019. Today 131.  Feels full with small amounts, nausea, food isn't moving out. Eating 5 small meals per day. Has abdominal pain if stomach is getting full. Burping, regurgitating acid. Nexium BID. Still with dysphagia if larger amounts of food swallowing. Nocturnal reflux severe at times.  Needs routine screening colonoscopy in near future.   Past Medical History:  Diagnosis Date  . Hypertension   . ILD (interstitial lung disease) ([place]) 02/08/2018  . Interstitial lung disease ([place])   . Multinodular thyroid    benign FNA 08/2017.  Marland Kitchen Scleroderma [facility])     Past Surgical History:  Procedure Laterality Date  . ABDOMINAL HERNIA REPAIR    . ESOPHAGOGASTRODUODENOSCOPY (EGD) WITH PROPOFOL N/A 01/28/2018   erosive reflux esophagitis, patulous EG Junction, no dilation, incomplete EGD due to retained food in stomach. GES thereafter with delayed gastric emptying.   Marland Kitchen RIGHT HEART CATH N/A 09/27/2017   Procedure: RIGHT HEART CATH;  Surgeon: Jolaine Artist, MD;  Location: [facility];  Service: Cardiovascular;  Laterality: N/A;  . UTERINE FIBROID SURGERY      Current Outpatient Medications  Medication Sig Dispense Refill  . chlorthalidone (HYGROTON) 25 MG tablet Take 25 mg by mouth daily.     . Cholecalciferol (VITAMIN D-3) 5000 units TABS Take by mouth daily.     Marland Kitchen esomeprazole (NEXIUM) 40 MG capsule Take 40 mg by mouth 2 (two) times daily.  5  . Multiple  Vitamins-Minerals (MEGA MULTIVITAMIN) POWD Take 1 Scoop by mouth daily.    . mycophenolate (CELLCEPT) 500 MG tablet Tale 3 tablets in the AM (1500mg  total) and 3 tablets in the PM (1500mg ) 170 tablet 1  . NP THYROID 90 MG tablet     . spironolactone (ALDACTONE) 50 MG tablet Take 1 tablet (50 mg total) by mouth 2 (two) times daily. (Patient taking differently: Take 50 mg by mouth daily. ) 60 tablet 3  . sulfamethoxazole-trimethoprim (BACTRIM DS) 800-160 MG tablet 1 tablet daily 30 tablet 3  . metoCLOPramide (REGLAN) 5 MG tablet Take 1 tablet (5 mg total) by mouth 2 (two) times daily before a meal. 60 tablet 3   No current facility-administered medications for this visit.     Allergies as of 07/03/2018 - Review Complete 07/03/2018  Allergen Reaction Noted  . Lisinopril Hives and Swelling 08/13/2015    Family History  Problem Relation Age of Onset  . Hypertension Father   . Hypertension Sister   . Hypertension Brother   . Diabetes Maternal Grandfather   . Diabetes Maternal Uncle   . Diabetes Mother   . Colon cancer Neg Hx     Social History   Socioeconomic History  . Marital status: Single    Spouse name: Not on file  . Number of children: 0  . Years of education: Not on file  . Highest education level: Associate degree: occupational, Hotel manager, or vocational program  Occupational History  . Occupation: CMA  Employer: [employer]  Social Needs  . Financial resource strain: Not on file  . Food insecurity:    Worry: Not on file    Inability: Not on file  . Transportation needs:    Medical: Not on file    Non-medical: Not on file  Tobacco Use  . Smoking status: Never Smoker  . Smokeless tobacco: Never Used  Substance and Sexual Activity  . Alcohol use: No    Frequency: Never  . Drug use: No  . Sexual activity: Not on file  Lifestyle  . Physical activity:    Days per week: Not on file    Minutes per session: Not on file  . Stress: Not on file    Relationships  . Social connections:    Talks on phone: Not on file    Gets together: Not on file    Attends religious service: Not on file    Active member of club or organization: Not on file    Attends meetings of clubs or organizations: Not on file    Relationship status: Not on file  Other Topics Concern  . Not on file  Social History Narrative   Patient is right-handed. She lives alone in one level home, a few steps to enter.    Review of Systems: Gen: Denies fever, chills, anorexia. Denies fatigue, weakness, weight loss.  CV: Denies chest pain, palpitations, syncope, peripheral edema, and claudication. Resp: Denies dyspnea at rest, cough, wheezing, coughing up blood, and pleurisy. GI: see HPI Derm: Denies rash, itching, dry skin Psych: Denies depression, anxiety, memory loss, confusion. No homicidal or suicidal ideation.  Heme: Denies bruising, bleeding, and enlarged lymph nodes.  Physical Exam: BP 112/81   Pulse (!) 102   Temp (!) 97.5 F (36.4 C) (Oral)   Ht 5\' 5"  (1.651 m)   Wt 131 lb (59.4 kg)   BMI 21.80 kg/m  General:   Alert and oriented. No distress noted. Pleasant and cooperative.  Head:  Normocephalic and atraumatic. Eyes:  Conjuctiva clear without scleral icterus. Mouth:  Oral mucosa pink and moist.  Abdomen:  +BS, soft, non-tender and non-distended. No rebound or guarding. No HSM or masses noted. Msk:  Symmetrical without gross deformities. Normal posture. Extremities:  Without edema. Neurologic:  Alert and  oriented x4 Psych:  Alert and cooperative. Normal mood and affect.

## 2018-07-03 NOTE — Assessment & Plan Note (Signed)
Start low-dose Reglan twice a day.  Return in 2 months.

## 2018-07-03 NOTE — Assessment & Plan Note (Signed)
48 year old very pleasant female with a history of chronic GERD status post endoscopy recently showing erosive reflux esophagitis, patulous EG junction, no dilation completed due to retained food in stomach.  She completed a gastric emptying study that did show delayed gastric emptying.  Reflux is overall improved from last visit while on Nexium twice a day; however, she notes persistent nausea, early satiety satiety, and abdominal discomfort with bloating despite dietary modification.  We will have her continue PPI twice a day and start low-dose Reglan at 5 mg before the first meal of the day and before dinner.  We discussed adverse side effects with this and she is to call if any of these occur.  We will see her back in 2 months or sooner if needed.

## 2018-07-03 NOTE — Patient Instructions (Addendum)
I sent in Reglan 5 milligram tablets to take before your first meal of the day and before dinner. Monitor for dry mouth, dizziness, confusion, involuntary movements like we discussed. Stop immediately if that happens. We may need to increase or decrease this.  I will see you in 2 months or sooner if needed!  It was a pleasure to see you today. I strive to create trusting relationships with patients to provide genuine, compassionate, and quality care. I value your feedback. If you receive a survey regarding your visit,  I greatly appreciate you taking time to fill this out.   Annitta Needs, PhD, ANP-BC [facility]    Gastroparesis  Gastroparesis is a condition in which food takes longer than normal to empty from the stomach. The condition is usually long-lasting (chronic). It may also be called delayed gastric emptying. There is no cure, but there are treatments and things that you can do at home to help relieve symptoms. Treating the underlying condition that causes gastroparesis can also help relieve symptoms. What are the causes? In many cases, the cause of this condition is not known. Possible causes include:  A hormone (endocrine) disorder, such as hypothyroidism or diabetes.  A nervous system disease, such as Parkinson's disease or multiple sclerosis.  Cancer, infection, or surgery that affects the stomach or vagus nerve. The vagus nerve runs from your chest, through your neck, to the lower part of your brain.  A connective tissue disorder, such as scleroderma.  Certain medicines. What increases the risk? You are more likely to develop this condition if you:  Have certain disorders or diseases, including: ? An endocrine disorder. ? An eating disorder. ? Amyloidosis. ? Scleroderma. ? Parkinson's disease. ? Multiple sclerosis. ? Cancer or infection of the stomach or the vagus nerve.  Have had surgery on the stomach or vagus nerve.  Take certain  medicines.  Are female. What are the signs or symptoms? Symptoms of this condition include:  Feeling full after eating very little.  Nausea.  Vomiting.  Heartburn.  Abdominal bloating.  Inconsistent blood sugar (glucose) levels on blood tests.  Lack of appetite.  Weight loss.  Acid from the stomach coming up into the esophagus (gastroesophageal reflux).  Sudden tightening (spasm) of the stomach, which can be painful. Symptoms may come and go. Some people may not notice any symptoms. How is this diagnosed? This condition is diagnosed with tests, such as:  Tests that check how long it takes food to move through the stomach and intestines. These tests include: ? Upper gastrointestinal (GI) series. For this test, you drink a liquid that shows up well on X-rays, and then X-rays will be taken of your intestines. ? Gastric emptying scintigraphy. For this test, you eat food that contains a small amount of radioactive material, and then scans are taken. ? Wireless capsule GI monitoring system. For this test, you swallow a pill (capsule) that records information about how foods and fluid move through your stomach.  Gastric manometry. For this test, a tube is passed down your throat and into your stomach to measure electrical and muscular activity.  Endoscopy. For this test, a long, thin tube is passed down your throat and into your stomach to check for problems in your stomach lining.  Ultrasound. This test uses sound waves to create images of inside the body. This can help rule out gallbladder disease or pancreatitis as a cause of your symptoms. How is this treated? There is no cure for gastroparesis. Treatment  may include:  Treating the underlying cause.  Managing your symptoms by making changes to your diet and exercise habits.  Taking medicines to control nausea and vomiting and to stimulate stomach muscles.  Getting food through a feeding tube in the hospital. This may be  done in severe cases.  Having surgery to insert a device into your body that helps improve stomach emptying and control nausea and vomiting (gastric neurostimulator). Follow these instructions at home:  Take over-the-counter and prescription medicines only as told by your health care provider.  Follow instructions from your health care provider about eating or drinking restrictions. Your health care provider may recommend that you: ? Eat smaller meals more often. ? Eat low-fat foods. ? Eat low-fiber forms of high-fiber foods. For example, eat cooked vegetables instead of raw vegetables. ? Have only liquid foods instead of solid foods. Liquid foods are easier to digest.  Drink enough fluid to keep your urine pale yellow.  Exercise as often as told by your health care provider.  Keep all follow-up visits as told by your health care provider. This is important. Contact a health care provider if you:  Notice that your symptoms do not improve with treatment.  Have new symptoms. Get help right away if you:  Have severe abdominal pain that does not improve with treatment.  Have nausea that is severe or does not go away.  Cannot drink fluids without vomiting. Summary  Gastroparesis is a chronic condition in which food takes longer than normal to empty from the stomach.  Symptoms include nausea, vomiting, heartburn, abdominal bloating, and loss of appetite.  Eating smaller portions, and low-fat, low-fiber foods may help you manage your symptoms.  Get help right away if you have severe abdominal pain. This information is not intended to replace advice given to you by your health care provider. Make sure you discuss any questions you have with your health care provider. Document Released: 04/24/2005 Document Revised: 02/27/2017 Document Reviewed: 02/27/2017 Elsevier Interactive Patient Education  2019 [employer].

## 2018-07-11 ENCOUNTER — Telehealth: Payer: Self-pay | Admitting: Pulmonary Disease

## 2018-07-11 ENCOUNTER — Encounter: Payer: Self-pay | Admitting: Podiatry

## 2018-07-11 ENCOUNTER — Ambulatory Visit: Payer: 59 | Admitting: Podiatry

## 2018-07-11 ENCOUNTER — Ambulatory Visit (INDEPENDENT_AMBULATORY_CARE_PROVIDER_SITE_OTHER): Payer: 59

## 2018-07-11 DIAGNOSIS — M779 Enthesopathy, unspecified: Secondary | ICD-10-CM

## 2018-07-11 DIAGNOSIS — M2042 Other hammer toe(s) (acquired), left foot: Secondary | ICD-10-CM

## 2018-07-11 DIAGNOSIS — M21619 Bunion of unspecified foot: Secondary | ICD-10-CM

## 2018-07-11 DIAGNOSIS — M2041 Other hammer toe(s) (acquired), right foot: Secondary | ICD-10-CM

## 2018-07-11 NOTE — Progress Notes (Signed)
Subjective:   Patient ID: Karen [name], female   DOB: 48 y.o.   MRN: [id number]   HPI Patient presents stating that she is had chronic bunions which are becoming more symptomatic and hammertoe deformities of both feet that she states makes it hard to wear shoe gear comfortably and she has significant family history with both sister and mother having had the same problem.  States she is been trying to accommodate with wider shoes and padding but it is no longer working and at this time she wants a more permanent solution to her problem.  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  She wants surgery and states that she has thought about this for a long time and would like to get it done soon.  I discussed with her a distal osteotomy along with digital fusion and arthroplasty fifth digit 1 foot at a time and this is what she wants and at this time I allowed her to read consent form for correction of left foot going over alternative treatments complications associated with surgery.  We did discuss her scleroderma and she is can to touch base with her doctor but given her description to me I do not think there is any issue with it and I did discuss the surgical procedures that will be necessary in all complications and after review she signed consent form.  She understands total recovery will take approximately 6 months and  she is scheduled for outpatient surgery and was dispensed air fracture walker for the postoperative.  And was encouraged to call with any questions that may come up prior to the procedure in the next 2 to 3 weeks  X-rays indicate significant HAV deformity bilateral with elevated digits especially nonweightbearing digits 2 through 4 bilateral with rotated fifth digit

## 2018-07-11 NOTE — Patient Instructions (Addendum)
Hammer Toe  Hammer toe is a change in the shape (a deformity) of your toe. The deformity causes the middle joint of your toe to stay bent. This causes pain, especially when you are wearing shoes. Hammer toe starts gradually. At first, the toe can be straightened. Gradually over time, the deformity becomes stiff and permanent. Early treatments to keep the toe straight may relieve pain. As the deformity becomes stiff and permanent, surgery may be needed to straighten the toe. What are the causes? Hammer toe is caused by abnormal bending of the toe joint that is closest to your foot. It happens gradually over time. This pulls on the muscles and connections (tendons) of the toe joint, making them weak and stiff. It is often related to wearing shoes that are too short or narrow and do not let your toes straighten. What increases the risk? You may be at greater risk for hammer toe if you:  Are female.  Are older.  Wear shoes that are too small.  Wear high-heeled shoes that pinch your toes.  Are a ballet dancer.  Have a second toe that is longer than your big toe (first toe).  Injure your foot or toe.  Have arthritis.  Have a family history of hammer toe.  Have a nerve or muscle disorder. What are the signs or symptoms? The main symptoms of this condition are pain and deformity of the toe. The pain is worse when wearing shoes, walking, or running. Other symptoms may include:  Corns or calluses over the bent part of the toe or between the toes.  Redness and a burning feeling on the toe.  An open sore that forms on the top of the toe.  Not being able to straighten the toe. How is this diagnosed? This condition is diagnosed based on your symptoms and a physical exam. During the exam, your health care provider will try to straighten your toe to see how stiff the deformity is. You may also have tests, such as:  A blood test to check for rheumatoid arthritis.  An X-ray to show how  severe the deformity is. How is this treated? Treatment for this condition will depend on how stiff the deformity is. Surgery is often needed. However, sometimes a hammer toe can be straightened without surgery. Treatments that do not involve surgery include:  Taping the toe into a straightened position.  Using pads and cushions to protect the toe (orthotics).  Wearing shoes that provide enough room for the toes.  Doing toe-stretching exercises at home.  Taking an NSAID to reduce pain and swelling. If these treatments do not help or the toe cannot be straightened, surgery is the next option. The most common surgeries used to straighten a hammer toe include:  Arthroplasty. In this procedure, part of the joint is removed, and that allows the toe to straighten.  Fusion. In this procedure, cartilage between the two bones of the joint is taken out and the bones are fused together into one longer bone.  Implantation. In this procedure, part of the bone is removed and replaced with an implant to let the toe move again.  Flexor tendon transfer. In this procedure, the tendons that curl the toes down (flexor tendons) are repositioned. Follow these instructions at home:  Take over-the-counter and prescription medicines only as told by your health care provider.  Do toe straightening and stretching exercises as told by your health care provider.  Keep all follow-up visits as told by your health care   provider. This is important. How is this prevented?  Wear shoes that give your toes enough room and do not cause pain.  Do not wear high-heeled shoes. Contact a health care provider if:  Your pain gets worse.  Your toe becomes red or swollen.  You develop an open sore on your toe. This information is not intended to replace advice given to you by your health care provider. Make sure you discuss any questions you have with your health care provider. Document Released: 04/21/2000 Document  Revised: 11/20/2016 Document Reviewed: 08/18/2015 Elsevier Interactive Patient Education  2019 [place]  A bunion is a bump on the base of the big toe that forms when the bones of the big toe joint move out of position. Bunions may be small at first, but they often get larger over time. They can make walking painful. What are the causes? A bunion may be caused by:  Wearing narrow or pointed shoes that force the big toe to press against the other toes.  Abnormal foot development that causes the foot to roll inward (pronate).  Changes in the foot that are caused by certain diseases, such as rheumatoid arthritis or polio.  A foot injury. What increases the risk? The following factors may make you more likely to develop this condition:  Wearing shoes that squeeze the toes together.  Having certain diseases, such as: ? Rheumatoid arthritis. ? Polio. ? Cerebral palsy.  Having family members who have bunions.  Being born with a foot deformity, such as flat feet or low arches.  Doing activities that put a lot of pressure on the feet, such as ballet dancing. What are the signs or symptoms? The main symptom of a bunion is a noticeable bump on the big toe. Other symptoms may include:  Pain.  Swelling around the big toe.  Redness and inflammation.  Thick or hardened skin on the big toe or between the toes.  Stiffness or loss of motion in the big toe.  Trouble with walking. How is this diagnosed? A bunion may be diagnosed based on your symptoms, medical history, and activities. You may have tests, such as:  X-rays. These allow your health care provider to check the position of the bones in your foot and look for damage to your joint. They also help your health care provider determine the severity of your bunion and the best way to treat it.  Joint aspiration. In this test, a sample of fluid is removed from the toe joint. This test may be done if you are in a lot of  pain. It helps rule out diseases that cause painful swelling of the joints, such as arthritis. How is this treated? Treatment depends on the severity of your symptoms. The goal of treatment is to relieve symptoms and prevent the bunion from getting worse. Your health care provider may recommend:  Wearing shoes that have a wide toe box.  Using bunion pads to cushion the affected area.  Taping your toes together to keep them in a normal position.  Placing a device inside your shoe (orthotics) to help reduce pressure on your toe joint.  Taking medicine to ease pain, inflammation, and swelling.  Applying heat or ice to the affected area.  Doing stretching exercises.  Surgery to remove scar tissue and move the toes back into their normal position. This treatment is rare. Follow these instructions at home: Managing pain, stiffness, and swelling   If directed, put ice on the painful area: ?  Put ice in a plastic bag. ? Place a towel between your skin and the bag. ? Leave the ice on for 20 minutes, 2-3 times a day. Activity   If directed, apply heat to the affected area before you exercise. Use the heat source that your health care provider recommends, such as a moist heat pack or a heating pad. ? Place a towel between your skin and the heat source. ? Leave the heat on for 20-30 minutes. ? Remove the heat if your skin turns bright red. This is especially important if you are unable to feel pain, heat, or cold. You may have a greater risk of getting burned.  Do exercises as told by your health care provider. General instructions  Support your toe joint with proper footwear, shoe padding, or taping as told by your health care provider.  Take over-the-counter and prescription medicines only as told by your health care provider.  Keep all follow-up visits as told by your health care provider. This is important. Contact a health care provider if your symptoms:  Get worse.  Do not  improve in 2 weeks. Get help right away if you have:  Severe pain and trouble with walking. Summary  A bunion is a bump on the base of the big toe that forms when the bones of the big toe joint move out of position.  Bunions can make walking painful.  Treatment depends on the severity of your symptoms.  Support your toe joint with proper footwear, shoe padding, or taping as told by your health care provider. This information is not intended to replace advice given to you by your health care provider. Make sure you discuss any questions you have with your health care provider. Document Released: 04/24/2005 Document Revised: 09/04/2017 Document Reviewed: 09/04/2017 Elsevier Interactive Patient Education  2019 [place] Instructions  Congratulations, you have decided to take an important step towards improving your quality of life.  You can be assured that the doctors and staff at [facility] will be with you every step of the way.  Here are some important things you should know:  1. Plan to be at the surgery center/hospital at least 1 (one) hour prior to your scheduled time, unless otherwise directed by the surgical center/hospital staff.  You must have a responsible adult accompany you, remain during the surgery and drive you home.  Make sure you have directions to the surgical center/hospital to ensure you arrive on time. 2. If you are having surgery at [facility] or [facility], you will need a copy of your medical history and physical form from your family physician within one month prior to the date of surgery. We will give you a form for your primary physician to complete.  3. We make every effort to accommodate the date you request for surgery.  However, there are times where surgery dates or times have to be moved.  We will contact you as soon as possible if a change in schedule is required.   4. No aspirin/ibuprofen for one week before surgery.  If  you are on aspirin, any non-steroidal anti-inflammatory medications (Mobic, Aleve, Ibuprofen) should not be taken seven (7) days prior to your surgery.  You make take Tylenol for pain prior to surgery.  5. Medications - If you are taking daily heart and blood pressure medications, seizure, reflux, allergy, asthma, anxiety, pain or diabetes medications, make sure you notify the surgery center/hospital before the day of surgery  so they can tell you which medications you should take or avoid the day of surgery. 6. No food or drink after midnight the night before surgery unless directed otherwise by surgical center/hospital staff. 7. No alcoholic beverages [id number] prior to surgery.  No smoking 24-hours prior or 24-hours after surgery. 8. Wear loose pants or shorts. They should be loose enough to fit over bandages, boots, and casts. 9. Don't wear slip-on shoes. Sneakers are preferred. 10. Bring your boot with you to the surgery center/hospital.  Also bring crutches or a walker if your physician has prescribed it for you.  If you do not have this equipment, it will be provided for you after surgery. 11. If you have not been contacted by the surgery center/hospital by the day before your surgery, call to confirm the date and time of your surgery. 12. Leave-time from work may vary depending on the type of surgery you have.  Appropriate arrangements should be made prior to surgery with your employer. 13. Prescriptions will be provided immediately following surgery by your doctor.  Fill these as soon as possible after surgery and take the medication as directed. Pain medications will not be refilled on weekends and must be approved by the doctor. 14. Remove nail polish on the operative foot and avoid getting pedicures prior to surgery. 15. Wash the night before surgery.  The night before surgery wash the foot and leg well with water and the antibacterial soap provided. Be sure to pay special attention to beneath  the toenails and in between the toes.  Wash for at least three (3) minutes. Rinse thoroughly with water and dry well with a towel.  Perform this wash unless told not to do so by your physician.  Enclosed: 1 Ice pack (please put in freezer the night before surgery)   1 Hibiclens skin cleaner   Pre-op instructions  If you have any questions regarding the instructions, please do not hesitate to call our office.  [place]: [address], [place], [place] [address] -- [place]: [address] [place], [place] [address] -- [phone]  [place]: [place], [place] [address] -- [phone]  [place]: [address] [place], [place], [place] [address] -- [phone]  Website: [email or web address]

## 2018-07-11 NOTE — Telephone Encounter (Signed)
Called and spoke with Levada Dy from [employer]. She stated that they received the referral and forms for the patients OFEV but they are needing the prescription.   Dr. Vaughan Browner please advise on prescription amount and signature. Thank you.    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

## 2018-07-15 NOTE — Telephone Encounter (Signed)
[facility] accerdo for f/u and was placed on long hold.  Will call back.

## 2018-07-16 NOTE — Telephone Encounter (Signed)
Called [facility] and spoke with [facility] and gave him a verbal for pt's OFEV Rx. Per Leroy Sea, they will get working on the Rx for pt. Nothing further needed.

## 2018-07-16 NOTE — Telephone Encounter (Signed)
Dr. Vaughan Browner please advise on prescription, thank you.

## 2018-07-16 NOTE — Telephone Encounter (Signed)
Dose is 150 mg bid

## 2018-07-17 ENCOUNTER — Encounter: Payer: Self-pay | Admitting: Podiatry

## 2018-07-18 ENCOUNTER — Telehealth: Payer: Self-pay

## 2018-07-18 NOTE — Telephone Encounter (Signed)
Returning call to [facility]  Call back # [phone]

## 2018-07-18 NOTE — Telephone Encounter (Signed)
Spoke to [place] with accerdo for update on Ofev.  Anderson Malta stated on 2/26, accerdo was unable to file Rx and transferred Rx to [facility].     I then called [facility] and spoke to [place]. Judeen Hammans was unable to locate pt within their system. Lm for pt to see if a pharmacy has reached out to her?

## 2018-07-18 NOTE — Telephone Encounter (Signed)
Called and spoke with patient. She did not know why we were calling her. I will forward this to [facility] as that is who the message was left by. I do not see any recent labs or procedures or images.

## 2018-07-19 NOTE — Telephone Encounter (Signed)
Please see 07/18/18 phone note.

## 2018-07-19 NOTE — Telephone Encounter (Signed)
  Spoke with pt, she states she doesnt know which pharmacy reached out to her. She doesn't remember the name or phone number. She stated the pharmacy said they tried to reach out to [place] but reminded her that we changed our phone number and location. Pt understood and will try to get [place] the pharmacy name since they need [place] to send the Ofev Rx to them. Will await return call.         2:45 PM  Note    Spoke to [place] with accerdo for update on Ofev.  Anderson Malta stated on 2/26, accerdo was unable to file Rx and transferred Rx to [facility].     I then called [facility] and spoke to [place]. Judeen Hammans was unable to locate pt within their system. Lm for pt to see if a pharmacy has reached out to her?

## 2018-07-19 NOTE — Telephone Encounter (Signed)
Please see 07/02/18 phone note

## 2018-07-19 NOTE — Telephone Encounter (Signed)
lmom to follow up with patient.

## 2018-07-23 MED FILL — CHLORTHALIDONE 25 MG TABS: 25 | 30 days supply | Qty: 30 | Fill #2 | Status: TO

## 2018-07-23 MED FILL — NP THYROID 90 MG TABLET: 90 | 30 days supply | Qty: 30 | Fill #2

## 2018-07-25 ENCOUNTER — Telehealth: Payer: Self-pay | Admitting: Pulmonary Disease

## 2018-07-25 MED ORDER — NINTEDANIB ESYLATE 150 MG PO CAPS: 150.0000 mg | ORAL_CAPSULE | Freq: Two times a day (BID) | ORAL | 11 refills | Status: DC

## 2018-07-25 NOTE — Telephone Encounter (Signed)
Marketing executive and spoke with Lam in regards to pt's OFEV needing to go to a different pharmacy. Stated to Lam that the message received states the med needs to be sent to [place] and wanted to know if they could provide [place] with the fax number and phone number so we can take care of this for pt's med.  Phone number for [facility] is [phone].  [place] ([place]) and spoke with [facility] Flattery to see if they could get OFEV there from their distributor and per Vito Berger is not a med that is in their system that they can get but stated if we wanted to send pt's info to them in regards to the med, we could.  I stated to Baxter Flattery that I was going to call the specialty pharmacy again to see if I could get more info from them and then would call her back if needed.  [place] again and spoke with Sam to check status of OFEV enrollment/get more info if med needed to come from another pharmacy. Per Sam, they see the Rx for pt's OFEV and stated that it is currently with their insurance department. Sam also stated that the med is out of their network per [employer].  Pt needs to get med from [place] ([place]).  [facility] again and was told since pt does have [employer] that the med would come from their pharmacy and we just need to electronically send the Rx to them so they can take care of the med for pt. I have electronically sent the OFEV Rx to [facility] so they can work on it for pt. Nothing further needed.

## 2018-07-26 ENCOUNTER — Telehealth: Payer: Self-pay | Admitting: Pulmonary Disease

## 2018-07-26 NOTE — Telephone Encounter (Signed)
[facility] at phone number provided and spoke with Cyril Mourning who stated to me that they do not carry OFEV medication. I stated to her that yesterday 07/25/2018 after speaking with [place] twice, they told me due to pt's insurance pt would have to get the med from [facility].  Cyril Mourning stated that [place] should be able to do an override and pt should be able to get med from them. Stated to [place] that I would call [facility] to state this to them.  [place] at [phone] and spoke with Alyse Low stating to her the above information. Per [place], this would have to be done with insurance. Stated again to [place] that they do not carry OFEV and based on this, Alyse Low stated she would have insurance team reach out to the pharmacy to see what override needs to be done since [place] does not carry the medication.

## 2018-07-26 NOTE — Telephone Encounter (Signed)
Received a call 07/25/2018 from [facility] stating that the [place] would have to come from [place] ([place]). I called pharmacy yesterday and spoke with Baxter Flattery stating to me to electronically send the [facility] Rx to them and they would see if they could get med taken care of for pt since pt has [employer].  Called and spoke with pt stating to her the info that I found out yesterday from [facility] and that I sent the med to [facility] as stated by [facility]. Pt expressed understanding. Routing to [place] and Lattie Haw T (whom has also been working with Dr. Vaughan Browner) so they can follow up on.

## 2018-07-29 ENCOUNTER — Encounter (HOSPITAL_COMMUNITY): Payer: 59 | Admitting: Internal Medicine

## 2018-07-29 ENCOUNTER — Telehealth: Payer: Self-pay | Admitting: *Deleted

## 2018-07-29 NOTE — Telephone Encounter (Signed)
LVM for [facility] to f/u with OFEV medication for patient update. X1 Called [phone] today

## 2018-07-29 NOTE — Telephone Encounter (Signed)
Noted  

## 2018-07-29 NOTE — Telephone Encounter (Signed)
"  I'm calling to see if my surgery is still on for tomorrow."  Unfortunately, we have to reschedule your surgery.  Dr. Paulla Dolly wants to reschedule it to April 7.  "If this is the earliest he has, go ahead and put me down for then.  I was hoping I wasn't going to have to reschedule."  I rescheduled her surgery via [facility].

## 2018-07-29 NOTE — Telephone Encounter (Signed)
There is several messages open on the same concerns Please see recent note on 07/29/2018 for update. Closing this message

## 2018-07-30 NOTE — Telephone Encounter (Signed)
Received fax from [place], ofev PA# [id number].  Requesting additional info, HRCT, PFT,and diagnosis info.  Requested information faxed to [phone], for PA approval. Medimpact phone # [phone], customer service

## 2018-07-31 ENCOUNTER — Encounter: Payer: Self-pay | Admitting: Podiatry

## 2018-07-31 MED FILL — ESOMEPRAZOLE MAG DR 40 MG C: 40 | 30 days supply | Qty: 60 | Fill #2

## 2018-08-01 ENCOUNTER — Ambulatory Visit: Payer: 59 | Admitting: Pulmonary Disease

## 2018-08-01 ENCOUNTER — Telehealth: Payer: Self-pay | Admitting: Pulmonary Disease

## 2018-08-01 NOTE — Telephone Encounter (Signed)
Tried to call [facility] pt's insurance at phone [phone], hold time was too long. Will try again later. X1

## 2018-08-01 NOTE — Telephone Encounter (Signed)
Yes. Please appeal decision. Thanks.

## 2018-08-01 NOTE — Telephone Encounter (Signed)
Patient's insurance has denied OFEV. Will we be proceeding with an appeal?

## 2018-08-02 NOTE — Telephone Encounter (Signed)
Attempted to call pt's insurance but the current hold time was greater than 47minutes. Will try to call back later.

## 2018-08-05 NOTE — Telephone Encounter (Signed)
Call made to initiate PA Appeal, [phone] med impact, spoke with Vicente Males, they will be faxing over the forms today to get this appeal initiated. Once received we will update chart.

## 2018-08-06 ENCOUNTER — Telehealth: Payer: Self-pay | Admitting: *Deleted

## 2018-08-06 NOTE — Telephone Encounter (Signed)
"  I got your message to call."  Yes, I was calling to let you know we must reschedule your surgery again.  The surgical center scheduler informed me yesterday that we have to wait until the third week of May.  So, can we reschedule you to Sep 24, 2018?  "Yes, that date will be fine.  I will need you to let my job know about the change in the date.  I had some paperwork completed by you all, so I need the date changed on that."  I'll let Helayne Seminole know, she handles the FMLA forms.  "Okay, thank you very much."

## 2018-08-06 NOTE — Telephone Encounter (Signed)
Raquel Sarna, do you know who the OFEV rep is so we can call?

## 2018-08-06 NOTE — Telephone Encounter (Signed)
Whitney Post at [phone] is the [facility] rep that comes to our office.  Attempted to call Maudie Mercury but unable to reach her. Left message for Maudie Mercury to return call.

## 2018-08-06 NOTE — Telephone Encounter (Signed)
08/06/2018 0918  I have filled out the med impact appeal form for OFEV.  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  The patient does qualify and does need this medication.  We may need to contact [place] for help and support with getting this approved.   Wyn Quaker FNP

## 2018-08-06 NOTE — Telephone Encounter (Signed)
Jonelle Sidle thank you for following up with Maudie Mercury.  That paperwork would be helpful.  I completed the form and was handed back to [place] LPN.  I spoke with [place] regarding this.  She reports that the forms are setting and Dr. Matilde Bash look at folder.  We can hold off on sending the paperwork for the appeal until we get the data from [place].Wyn Quaker, FNP

## 2018-08-06 NOTE — Telephone Encounter (Signed)
PA appeal form has been received. Will give form to app of the day in absence of Dr. Vaughan Browner.

## 2018-08-06 NOTE — Telephone Encounter (Signed)
Will give form to Dr. Vaughan Browner nurse to hold until we have spoke with Ofev rep.

## 2018-08-06 NOTE — Telephone Encounter (Signed)
Ok thanks [place]. I will print off and give to [place].

## 2018-08-06 NOTE — Telephone Encounter (Signed)
I attempted to call the patient to reschedule her surgery from September 03, 2018 to Sep 24, 2018.  I left her a message to call me back.

## 2018-08-06 NOTE — Telephone Encounter (Signed)
Kim returning call

## 2018-08-06 NOTE — Telephone Encounter (Signed)
Spoke with Karen [name], I explained to her what was going on with the [facility], she stated she would send me some data on these types of patients that usually will help witht he appeal process. I will await forms. In the meantime, Karen [name] did you fax the paperwork already? I want to add this information to it so we can fax it all together.  Letter-FDA information

## 2018-08-08 DIAGNOSIS — Z3169 Encounter for other general counseling and advice on procreation: Secondary | ICD-10-CM | POA: Diagnosis not present

## 2018-08-08 NOTE — Telephone Encounter (Signed)
Yes once filled out will update chart.

## 2018-08-08 NOTE — Telephone Encounter (Signed)
Paper received. Printed letter explaining to patient we are appealing the denial of the [place]. Letter and form placed in mail to have patient sign. Will route message to nurse to hold until patient signs and sends back to [place]. Paperwork placed upfront for outgoing mail.

## 2018-08-08 NOTE — Telephone Encounter (Signed)
[place] do you still have the paperwork?

## 2018-08-09 ENCOUNTER — Other Ambulatory Visit: Payer: 59

## 2018-08-13 NOTE — Telephone Encounter (Signed)
Contacted patient by phone re: status of OFEV paperwork that was mailed to her last week.  Patient states she has not received it. She had only received educational literature regarding OFEV.  Patient instructed to please let us know if she does not receive the info from our office this week, as we need her signature to proceed with completing an appeal for this medication.  Patient acknowledged understanding and will call us if she does not receive our information.

## 2018-08-21 ENCOUNTER — Telehealth: Payer: Self-pay | Admitting: Pulmonary Disease

## 2018-08-21 ENCOUNTER — Other Ambulatory Visit (HOSPITAL_COMMUNITY): Payer: Self-pay | Admitting: Internal Medicine

## 2018-08-21 MED FILL — CLINDAMYCIN PHOS-BENZOYL PE: 1-5 | 15 days supply | Qty: 25 | Fill #0

## 2018-08-21 MED FILL — NP THYROID 120 MG TABLET: 120 | 30 days supply | Qty: 30 | Fill #0 | Status: TO

## 2018-08-21 NOTE — Telephone Encounter (Signed)
Handicap placard form has been signed and forms placed in mail to be mailed out. Nothing further is needed at this time.   Will route mssage to nurse Lattie Haw to hold until patient returns appeal form.

## 2018-08-21 NOTE — Telephone Encounter (Signed)
Received form that was mailed for [facility] appeal. Return to sender address as no mail box at address listed.   Call returned to patient, made aware the form was returned. Patient asked that we send to her P.O. Box instead. Confirmed P.O. Box address. She also states her Handicap placard expires this month, requesting renewal.   Handicap form given to [place]. Once signed will update note.

## 2018-08-22 ENCOUNTER — Ambulatory Visit (HOSPITAL_COMMUNITY): Payer: 59

## 2018-08-22 ENCOUNTER — Encounter (HOSPITAL_COMMUNITY): Payer: 59 | Admitting: Internal Medicine

## 2018-08-22 MED FILL — CHLORTHALIDONE 25 MG TABS: 25 | 30 days supply | Qty: 30 | Fill #0 | Status: TO

## 2018-08-23 ENCOUNTER — Other Ambulatory Visit: Payer: 59

## 2018-08-23 NOTE — Telephone Encounter (Signed)
As of today, appeal form not received back.  Will leave message in my box to follow up once form received back from Patient.

## 2018-08-26 DIAGNOSIS — D250 Submucous leiomyoma of uterus: Secondary | ICD-10-CM | POA: Diagnosis not present

## 2018-08-26 DIAGNOSIS — Z3169 Encounter for other general counseling and advice on procreation: Secondary | ICD-10-CM | POA: Diagnosis not present

## 2018-08-28 NOTE — Telephone Encounter (Signed)
Appeal form not received back from Patient.  Will follow up with Patient later this week, if nothing arrives.

## 2018-08-29 NOTE — Telephone Encounter (Signed)
[employer] appeal form received through mail today. Form is completed and signed by Patient.  Appeal form will be  faxed to [phone]. Will continue to follow up.

## 2018-08-30 NOTE — Telephone Encounter (Signed)
Letter received today from [place] stating, they have received the request of level 1 appeal. [employer] will "notify by mail the determination on later than 15 days from receipt of appeal".  Will wait for determination

## 2018-09-03 ENCOUNTER — Telehealth: Payer: Self-pay

## 2018-09-03 ENCOUNTER — Ambulatory Visit (INDEPENDENT_AMBULATORY_CARE_PROVIDER_SITE_OTHER): Payer: 59 | Admitting: Gastroenterology

## 2018-09-03 ENCOUNTER — Encounter: Payer: Self-pay | Admitting: Gastroenterology

## 2018-09-03 ENCOUNTER — Other Ambulatory Visit: Payer: Self-pay

## 2018-09-03 DIAGNOSIS — R1031 Right lower quadrant pain: Secondary | ICD-10-CM | POA: Diagnosis not present

## 2018-09-03 NOTE — Telephone Encounter (Signed)
CT scheduled for 09/05/18 (as AB requested) at 5:00pm, arrive at 4:45pm. Pick up contrast. NPO 4 hours prior to test. Called and informed pt of appt.

## 2018-09-03 NOTE — Telephone Encounter (Signed)
AB ordered stat CT abd/pelvis w/contrast (request appt 09/05/18). PA for CT submitted via [facility] website. Case ID# [id number].

## 2018-09-03 NOTE — Telephone Encounter (Signed)
No PA needed for CT abd/pelvis.

## 2018-09-03 NOTE — Patient Instructions (Signed)
We have ordered a stat CT scan.   Further recommendations to follow!  I enjoyed seeing you again today! As you know, I value our relationship and want to provide genuine, compassionate, and quality care. I welcome your feedback. If you receive a survey regarding your visit,  I greatly appreciate you taking time to fill this out. See you next time!  Annitta Needs, PhD, ANP-BC [facility]

## 2018-09-03 NOTE — Progress Notes (Signed)
Primary Care Physician:  [name], MD  Primary GI: Dr. [name]  Virtual Visit via Telephone Note Due to COVID-19, visit is conducted virtually and was requested by patient.   I connected with Karen [name] on 09/03/18 at 11:30 AM EDT by telephone and verified that I am speaking with the correct person using two identifiers.   I discussed the limitations, risks, security and privacy concerns of performing an evaluation and management service by telephone and the availability of in person appointments. I also discussed with the patient that there may be a patient responsible charge related to this service. The patient expressed understanding and agreed to proceed.  Chief Complaint  Patient presents with  . Abdominal Pain    right lower abd, radiates to lower back; worse since last Friday  . Nausea    some yesterday     History of Present Illness: 48 year old female with history of GERD and dysphagia. EGD completed Sept 2019 with erosive reflux esophagitis, patulous EG Junction, no dilation, incomplete EGD due to retained food in stomach. GES thereafter with delayed gastric emptying.   Gastroparesis: appetite has not been good since Friday. Nexium BID, Reglan once per day. No issues with gastroparesis.   Acute onset of pain Friday. RLQ, lower abdomen, radiates around to the back. Constant pain. No fever or chills. Mild nausea off and on. No appetite with pain. Pain 8 or 9, worse Friday at about a 10. Saturday felt slightly better. Pain described as stabbing, sore, no diarrhea. No constipation. Unchanged with BM. Placed heating pad which helps a little. Laying down doesn't hurt as bad but worse when getting up. Urinary: no burning, foul odor, or increased frequency. Has had kidney stones before, somewhat similar but not sure.   Past Medical History:  Diagnosis Date  . Hypertension   . ILD (interstitial lung disease) ([place]) 02/08/2018  . Interstitial lung disease ([place])   .  Multinodular thyroid    benign FNA 08/2017.  Marland Kitchen Scleroderma [facility])      Past Surgical History:  Procedure Laterality Date  . ABDOMINAL HERNIA REPAIR    . ESOPHAGOGASTRODUODENOSCOPY (EGD) WITH PROPOFOL N/A 01/28/2018   erosive reflux esophagitis, patulous EG Junction, no dilation, incomplete EGD due to retained food in stomach. GES thereafter with delayed gastric emptying.   Marland Kitchen RIGHT HEART CATH N/A 09/27/2017   Procedure: RIGHT HEART CATH;  Surgeon: Jolaine Artist, MD;  Location: [place];  Service: Cardiovascular;  Laterality: N/A;  . UTERINE FIBROID SURGERY       Current Meds  Medication Sig  . chlorthalidone (HYGROTON) 25 MG tablet Take 25 mg by mouth daily.   . Cholecalciferol (VITAMIN D-3) 5000 units TABS Take by mouth daily.   Marland Kitchen esomeprazole (NEXIUM) 40 MG capsule Take 40 mg by mouth 2 (two) times daily.  . metoCLOPramide (REGLAN) 5 MG tablet Take 1 tablet (5 mg total) by mouth 2 (two) times daily before a meal. (Patient taking differently: Take 5 mg by mouth daily. )  . Multiple Vitamins-Minerals (MEGA MULTIVITAMIN) POWD Take 1 Scoop by mouth daily.  . mycophenolate (CELLCEPT) 500 MG tablet Tale 3 tablets in the AM (1500mg  total) and 3 tablets in the PM (1500mg )  . spironolactone (ALDACTONE) 50 MG tablet TAKE 1 TABLET BY MOUTH TWICE A DAY (Patient taking differently: Take 50 mg by mouth daily. )  . sulfamethoxazole-trimethoprim (BACTRIM DS) 800-160 MG tablet 1 tablet daily  . thyroid (NP THYROID) 120 MG tablet Take 120 mg  by mouth daily.      Review of Systems: See HPI  Observations/Objective: No distress. Video call completed but does appear uncomfortable.   Assessment and Plan: 48 year old female with acute RLQ pain onset several days ago, now with pain lower abdomen as well, radiating to back. Afebrile per patient but has associated nausea and decreased appetite. Worsened with movement. Discussed stat CT due to acuity, persistent pain. Broad differentials.  Suspect this could be renal stone as she has a history of same. Appendix in situ. Doubt GI etiology. I recommended the scan within 24 hours, but she requested to complete this on Thursday. CT scan for this Thursday.   Follow Up Instructions: See AVS   I discussed the assessment and treatment plan with the patient. The patient was provided an opportunity to ask questions and all were answered. The patient agreed with the plan and demonstrated an understanding of the instructions.   The patient was advised to call back or seek an in-person evaluation if the symptoms worsen or if the condition fails to improve as anticipated.  I provided 20 minutes of ace-to-face time during this video call encounter.   Annitta Needs, PhD, ANP-BC [facility]

## 2018-09-03 NOTE — Addendum Note (Signed)
Addended by: Georjean Mode on: 09/03/2018 12:05 PM   Modules accepted: Orders

## 2018-09-05 ENCOUNTER — Ambulatory Visit (INDEPENDENT_AMBULATORY_CARE_PROVIDER_SITE_OTHER): Payer: 59 | Admitting: Internal Medicine

## 2018-09-05 ENCOUNTER — Other Ambulatory Visit: Payer: Self-pay | Admitting: Gastroenterology

## 2018-09-05 ENCOUNTER — Other Ambulatory Visit: Payer: Self-pay

## 2018-09-05 ENCOUNTER — Ambulatory Visit (HOSPITAL_COMMUNITY)
Admission: RE | Admit: 2018-09-05 | Discharge: 2018-09-05 | Disposition: A | Discharge status: Home / Self Care | Payer: 59 | Source: Ambulatory Visit | Attending: Gastroenterology | Admitting: Gastroenterology

## 2018-09-05 DIAGNOSIS — I10 Essential (primary) hypertension: Secondary | ICD-10-CM | POA: Diagnosis not present

## 2018-09-05 DIAGNOSIS — R1031 Right lower quadrant pain: Secondary | ICD-10-CM | POA: Diagnosis not present

## 2018-09-05 DIAGNOSIS — M3481 Systemic sclerosis with lung involvement: Secondary | ICD-10-CM | POA: Diagnosis not present

## 2018-09-05 DIAGNOSIS — K8020 Calculus of gallbladder without cholecystitis without obstruction: Secondary | ICD-10-CM | POA: Diagnosis not present

## 2018-09-05 DIAGNOSIS — R5383 Other fatigue: Secondary | ICD-10-CM | POA: Diagnosis not present

## 2018-09-05 LAB — POCT I-STAT CREATININE: Creatinine, Ser: 0.8 mg/dL (ref 0.44–1.00)

## 2018-09-05 MED ORDER — CIPROFLOXACIN HCL 500 MG PO TABS: 500.0000 mg | ORAL_TABLET | Freq: Two times a day (BID) | ORAL | 0 refills | Status: AC

## 2018-09-05 MED ORDER — METRONIDAZOLE 500 MG PO TABS: 500.0000 mg | ORAL_TABLET | Freq: Three times a day (TID) | ORAL | 0 refills | Status: AC

## 2018-09-05 MED ORDER — IOHEXOL 300 MG/ML  SOLN
100.0000 mL | Freq: Once | INTRAMUSCULAR | Status: AC | PRN
  Administered 2018-09-05: 100 mL via INTRAVENOUS

## 2018-09-06 ENCOUNTER — Telehealth: Payer: Self-pay | Admitting: Pulmonary Disease

## 2018-09-06 NOTE — Telephone Encounter (Signed)
Received a fax from [place] on 08/30/2018 stating that the first level appeal was denied for Ofev.   Denial reason: "Your doctor provided the results of a HRCT that you underwent, but it was not clear whether this test revealed at least 10% fibrosis.  Your doctor also has not attested that other causes of interstitial lung disease have been ruled out."  Karen [name] please advise on what to include in second level appeal letter.  Thanks!   (complete fax from [place] has been given to [place] for review)

## 2018-09-06 NOTE — Telephone Encounter (Signed)
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  Further review February/2020 office visit from Dr. Hassell Done where he specifically states that this is for scleroderma ILD he even gives the specific study that supports the use of 0FEV for this patient.  Please print office note and fax to med impact.  Unfortunately believe med impact is still operating off of the belief that anti-fibrotic's are only for idiopathic pulmonary fibrosis.  When in reality in 2019 we have started using them and connective tissue disorders as well such as scleroderma.  Wyn Quaker, FNP

## 2018-09-06 NOTE — Telephone Encounter (Signed)
I have printed off Dr. Matilde Bash last office visit note, the study mentioned below, and this phone note and faxed it to [place] attn: Appeal Coordinator at [phone].  Documents have been left on Lauren's desk for follow-up.  Will route to Lauren for continued follow-up.

## 2018-09-09 NOTE — Progress Notes (Signed)
CC'D TO PCP °

## 2018-09-10 NOTE — Telephone Encounter (Signed)
Looks by Karen [name] last note she faxed all information to the Cytogeneticist. No new forms or faxes have come in as of 09/10/18 1535

## 2018-09-10 NOTE — Telephone Encounter (Signed)
Lauren, please advise if you have all paperwork needed for appeal. Thank you.

## 2018-09-11 ENCOUNTER — Telehealth: Payer: Self-pay | Admitting: *Deleted

## 2018-09-11 NOTE — Progress Notes (Signed)
Karen [name]: can we reach out to patient to see how she is doing? I treated empirically for possible diverticulitis last week. She was to call on Monday. Sending message in [place] as well.

## 2018-09-11 NOTE — Telephone Encounter (Signed)
"  Could you please give me a call back today?"  I'm returning your call.  How can I help you.  "I want to know if I am still scheduled for May 19 for my surgery.  My job is asking.  I told them I was not for sure because of all the cancellations due to the Virus.  Yes, you are still scheduled for Sep 24, 2018.  The surgical center is back open.  "My job said they need some updated information."  I'll ask Marcie Bal to update them.  She handles all FMLA paperwork.

## 2018-09-12 MED FILL — METOCLOPRAMIDE 5 MG TABLET: 5 | 30 days supply | Qty: 60 | Fill #0

## 2018-09-12 MED FILL — SPIRONOLACTONE 50 MG TABS: 50 | 30 days supply | Qty: 60 | Fill #0

## 2018-09-12 NOTE — Telephone Encounter (Signed)
Spoke with Karen [name], she states the OFEV case was closed from [facility] and she wanted to follow up on this. I read her the previous messages and advised her that [place] sent in more information for the appeal. Karen [name] is going to follow up and call me back to advise the next steps to help patient with receiving the OFEV. Will await return call.

## 2018-09-12 NOTE — Telephone Encounter (Signed)
Alcario Drought from [facility] checking on the status of the Ofev medication.  Kim phone number [phone].

## 2018-09-13 NOTE — Telephone Encounter (Signed)
Karen [name] is returning phone call.  States we need to call [place] for the appeal.  Karen [name] phone number is [phone].

## 2018-09-13 NOTE — Telephone Encounter (Signed)
Jackie Plum forwarded to you since you've been working on this one.

## 2018-09-13 NOTE — Telephone Encounter (Signed)
Spoke with Judeen Hammans at [employer] and she stated there is a second tier appeal on file but there is no determination. I asked her when we would know and she responded saying she was unable to tell me that information. I called Kim to let her know and she stated she would send over paperwork for the bridge program dso the patient can start her medication for free. She is going to fax a form to my attention. Will await form.

## 2018-09-16 ENCOUNTER — Ambulatory Visit: Payer: 59 | Admitting: Gastroenterology

## 2018-09-19 ENCOUNTER — Telehealth: Payer: Self-pay | Admitting: Podiatry

## 2018-09-19 NOTE — Telephone Encounter (Addendum)
Caren Griffins sent me an email she received from [facility].  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 Should reschedule when covid19 restrictions are eased. Hall Busing (09/19/2018 13:50) emailed dm for okay - Rhetta Mura (09/19/2018 13:53) spoke w pt, notified cs/office - Rhetta Mura (09/19/2018 14:11) I spoke w this patient, she was not to happy about being postponed until restrictions are lifted. ? Can you forward to office?  Thanks,  Rhetta Mura RN [place] Pre Anesthesia Assessment Nurse

## 2018-09-19 NOTE — Telephone Encounter (Signed)
Pt called regarding her surgery that is scheduled with Dr.Regal. The patient received a call from the [place] today stating that her surgery might be postponed and the patient would like some clarification. Please give patient a call.

## 2018-09-23 ENCOUNTER — Telehealth: Payer: Self-pay | Admitting: *Deleted

## 2018-09-23 NOTE — Telephone Encounter (Signed)
"  I was supposed to have surgery on tomorrow.  I got a call from someone from the surgical center.  They said my surgery was canceled.  I don't know why.  I called last week and no one called me back.  I left a message for Dr. Paulla Dolly."  My apologies, Dr. Paulla Dolly was not in the office on Friday.  Let me see what he says and I will give you a call back with his response.  As for the cancellation of the surgery, the anesthesiologist canceled the surgery due to you having Scleroderma.  He suggested you hold off on doing it until the Covid 19 restrictions are eased.  Do you have Scleroderma?  "Yes, I do have Scleroderma?  So when can I reschedule it?"  I'll see what Dr. Paulla Dolly suggests and call you back.

## 2018-09-23 NOTE — Telephone Encounter (Signed)
Called and spoke with Maudie Mercury with [facility] regarding status of determination of OFEV Maudie Mercury advised that the pt was denied for OFEV first on 09/07/18 due to not enough info with paperwork Maudie Mercury advised to call [employer] to follow up for determination  LVM on general VM for [employer] at [phone] regarding tier 2 appeal for [facility] and spoke with Delsa Sale with [employer] at [phone] regarding appeal Delsa Sale advised that this is still udner review as of 09/23/18. Standard appeal takes 10-15 days for determination; advised that this has been over 15 days She advised nothing further is needed, just waiting on determination She advised to call [place] billing dept for additional info  Called and spoke with [place] billing at [phone] with [facility] She advised there is no info on OFEV on patient, and no claim on file at this time. She advised to call the pharmacy to better assist my request  Called and spoke with [place], they are both unable to provide info on [employer] and spoke with Maudie Mercury with [facility] to provide her of this info At this time we are waiting on a decision from [place]  Need to call [employer] at [phone] tomorrow to f/u on determination of the appeal for [facility]  Routing message to [place] T for update and review.

## 2018-09-23 NOTE — Telephone Encounter (Signed)
I called Dr. Ouida Sills to see if they can overide the cancellation as she is healthy

## 2018-09-23 NOTE — Telephone Encounter (Signed)
I am calling to let you know that Dr. Paulla Dolly spoke to the anesthesiologist and informed him that you are in good health.  He said it was okay for you to have the surgery.  So, would you like me to put you back on for tomorrow.  "I can't do it tomorrow now.  This is last minute.  I had called and informed my job that I wasn't having the surgery.  I canceled all my arrangements.  It's not possible to do it now on tomorrow.  Dr. Paulla Dolly can do it next week or October 08, 2018.  "Let's schedule it for October 08, 2018, that will give me time to make arrangements again and get my FMLA situated again."  I'll get it reschedule to October 08, 2018.  I called and informed Caren Griffins at [facility] that we were rescheduling the patient from 09/24/2018 to 10/08/2018.  I also changed it via the [employer] Portal.

## 2018-09-23 NOTE — Telephone Encounter (Signed)
LVM for Karen [name] with [employer] at phone [phone] Ref #[id number] to return call back.X1

## 2018-09-23 NOTE — Telephone Encounter (Signed)
Cyrstal called from [employer] because she needs to speak with someone about the pts appeal for OFEV. She can be reached at [phone]. Reference number [id number]

## 2018-09-24 NOTE — Telephone Encounter (Signed)
Kim from [place] is calling back [phone] for OFEV prior authorization. The medication is under 2nd appeal.

## 2018-09-24 NOTE — Telephone Encounter (Signed)
See 09/06/18 note.

## 2018-09-24 NOTE — Telephone Encounter (Signed)
Karen [name] is returning phone call.  Karen phone number is [phone].  Medication is under 2nd appeal.

## 2018-09-24 NOTE — Telephone Encounter (Signed)
I called [employer] and I had to leave a message for Maudie Mercury to call back. Will await a return call.

## 2018-09-24 NOTE — Telephone Encounter (Signed)
Called [phone], [place], spoke with Lattie Haw.  Lattie Haw stated appeal is second appeal and they do not handle second appeals.  Lattie Haw could not give time frame or contact for second appeal info. Lattie Haw stated she did not need ref number. Called [phone] for Crystal, left detailed message for her to call back with update and to let us know if there is anything else needed. Crystal has been calling office requesting to speak with someone re guarding Ofev appeal. Ref # [id number].

## 2018-09-24 NOTE — Telephone Encounter (Signed)
Called and left message for [place], [place], [phone], to return call re guarding Ofev appeal. Ref # [id number].

## 2018-09-25 NOTE — Telephone Encounter (Signed)
Karen [name] from [place] states under 2nd appeal. May take up to 30 days.  [place] phone number is [phone], Ref# [id number].

## 2018-09-25 NOTE — Telephone Encounter (Signed)
I called [employer] to speak with Crystal but there was no answer so I left a detailed message advising her to call back. Will await return call.

## 2018-10-02 ENCOUNTER — Other Ambulatory Visit: Payer: 59

## 2018-10-03 NOTE — Telephone Encounter (Signed)
Waiting on response for second appeal.

## 2018-10-04 MED FILL — NP THYROID 120 MG TABLET: 120 | 90 days supply | Qty: 90 | Fill #0

## 2018-10-04 MED FILL — CHLORTHALIDONE 25 MG TABS: 25 | 60 days supply | Qty: 60 | Fill #0

## 2018-10-04 MED FILL — ESOMEPRAZOLE MAG DR 40 MG C: 40 | 60 days supply | Qty: 120 | Fill #3

## 2018-10-07 HISTORY — PX: BUNIONECTOMY: SHX129

## 2018-10-08 ENCOUNTER — Encounter: Payer: Self-pay | Admitting: Podiatry

## 2018-10-08 DIAGNOSIS — M2042 Other hammer toe(s) (acquired), left foot: Secondary | ICD-10-CM | POA: Diagnosis not present

## 2018-10-08 DIAGNOSIS — K219 Gastro-esophageal reflux disease without esophagitis: Secondary | ICD-10-CM | POA: Diagnosis not present

## 2018-10-08 DIAGNOSIS — M2012 Hallux valgus (acquired), left foot: Secondary | ICD-10-CM | POA: Diagnosis not present

## 2018-10-08 MED FILL — ONDANSETRON HCL 4 MG TABLET: 4 | 5 days supply | Qty: 15 | Fill #0

## 2018-10-08 MED FILL — OXYCODONE-ACETAMINOPHEN 10-: 10-325 | 4 days supply | Qty: 25 | Fill #0

## 2018-10-09 ENCOUNTER — Telehealth: Payer: Self-pay | Admitting: *Deleted

## 2018-10-09 NOTE — Telephone Encounter (Signed)
Called patient at [phone] (Cell #) to see how they were doing from when they got surgery done on Tuesday, October 08, 2018.  DOS 10/08/2018 [place] Osteotomy; [place] Bunionectomy LT; Hammertoe repair w/pin 2, 3, 4 and 5 LT  Patient stated, "I am feeling okay; Pain 8-9/10; I am taking pain medicine; I have had no nausea or vomiting; I took my pain medicine today at 11:45 am and it is making me tired. I am not sure if this pain medicine is helping. There are no fever, chills and no bleeding. My bandages are dry and intact and I am wearing my boot and elevating my foot"

## 2018-10-10 ENCOUNTER — Encounter: Payer: Self-pay | Admitting: Podiatry

## 2018-10-10 NOTE — Telephone Encounter (Signed)
I called pt and she states she is having a dull constant pain, occasional sharp shooting in the toes. I asked pt to check the temperature of the toes and she stated she was scared. I told pt it would be okay. Pt states the toes are not warm or cold. I told pt to take the pain medication as directed, and if able to tolerate take 2 OTC ibuprofen every 4-6 hours between the dosing of the pain medication, to remove the cam boot, open ended sock, ace wrap only, elevate surgery foot for 15 minutes, then place foot level with the hip and rewrap the ace looser beginning at the toes and rolling up the leg, replace sock and boot. Pt states Dr. Paulla Dolly said she could take off the boot except to walk, and that is what she has been doing. Pt asked if she had to sleep with the foot elevated and I told her no.

## 2018-10-10 NOTE — Telephone Encounter (Signed)
10/10/2018 1528  I have received in the mail today a third and final external review decision.  They have continued to deny the patient's anti-fibrotic appeal.  I have placed these records in Dr. Matilde Bash Lookup folder.  I have handed these to [employer].  Will route this message to Dr. Vaughan Browner and Salome Arnt as Juluis [place].  Wyn Quaker, FNP

## 2018-10-11 ENCOUNTER — Other Ambulatory Visit: Payer: Self-pay

## 2018-10-11 ENCOUNTER — Ambulatory Visit (INDEPENDENT_AMBULATORY_CARE_PROVIDER_SITE_OTHER): Payer: 59 | Admitting: Gastroenterology

## 2018-10-11 ENCOUNTER — Encounter: Payer: Self-pay | Admitting: Gastroenterology

## 2018-10-11 DIAGNOSIS — K219 Gastro-esophageal reflux disease without esophagitis: Secondary | ICD-10-CM | POA: Diagnosis not present

## 2018-10-11 DIAGNOSIS — Z1211 Encounter for screening for malignant neoplasm of colon: Secondary | ICD-10-CM

## 2018-10-11 DIAGNOSIS — K3184 Gastroparesis: Secondary | ICD-10-CM | POA: Diagnosis not present

## 2018-10-11 DIAGNOSIS — K5900 Constipation, unspecified: Secondary | ICD-10-CM | POA: Diagnosis not present

## 2018-10-11 NOTE — Progress Notes (Signed)
Primary Care Physician:  [name], MD  Primary GI: Dr. [name]   Patient Location: Home   Provider Location: [facility] office   Reason for Visit: Follow-up   Persons present on the virtual encounter, with roles: Aliene Altes, PA-C (provider); Karen [name] (patient)   Total time (minutes) spent on medical discussion: 19 minutes   Due to COVID-19, visit was conducted using virtual method.  Visit was requested by patient.  Virtual Visit via Telephone Note Due to COVID-19, visit is conducted virtually and was requested by patient.   I connected with Karen [name] on 10/11/18 at  8:00 AM EDT by [email or web address] and verified that I am speaking with the correct person using two identifiers.   I discussed the limitations, risks, security and privacy concerns of performing an evaluation and management service by video and the availability of in person appointments. I also discussed with the patient that there may be a patient responsible charge related to this service. The patient expressed understanding and agreed to proceed.  Chief Complaint  Patient presents with   Gastroesophageal Reflux   Emesis      History of Present Illness:  Karen [name] is a 48 yo female presenting for follow up. PMHx of chronic lung disease, GERD, dysphagia, and gastroparesis diagnosed September 2019. EGD prior to GES in September 2019 with erosive esophagitis, patulous EG Junction. No dilation and limited exam due to retained food in the stomach.   Last seen by our office on 09/03/18 for acute RLQ pain. CT on 09/05/18 remarkable for questionable mild inflammatory changes of rectosigmoid junction/rectum representing possible diverticulitis or proctitis, cholelithiasis, enlarged fibroid uterus with local mass effect on sigmoid colon/rectum and urinary bladder, physiologic cystic changes of adnexa, and redemonstration of left-sided nonobstructive nephrolithiasis.Patient was empirically treated for  diverticulitis with ciprofloxacin and metronidazole for 7 days.   Today she states she is feeling improved since last visit. RLQ is now resolved. She is still feeling very full throughout the day. Improved some with Reglan in the morning, but worsens throughout the day. Denies side effects from Reglan. Eats at least 2 full meals a day and most of the time 3 meals a day. She has tried eating smaller more frequent meals, but says she starts losing weight then. Occasional reflux if she lays down after eating. Occasional nausea, Zofran helps, needs this about 1x.week. One episode of vomiting yesterday due to feeling very full, which makes her nauseous. No vomiting prior to yesterday and none since. No dysphagia at this time. Doesn't think she is losing weight now. BMs typically soft daily until started pain medicine after foot surgery recently. She follows up with podiatry next Wednesday. Has not tried anything for constipation. Last BM Monday. Denies brbpr, melena, abdominal pain.  Screening colonoscopy overdue. No family history of colon polyps or colon cancer.     Past Medical History:  Diagnosis Date   Hypertension    ILD (interstitial lung disease) ([place]) 02/08/2018   Interstitial lung disease ([place])    Multinodular thyroid    benign FNA 08/2017.   Scleroderma ([place])      Past Surgical History:  Procedure Laterality Date   ABDOMINAL HERNIA REPAIR     ESOPHAGOGASTRODUODENOSCOPY (EGD) WITH PROPOFOL N/A 01/28/2018   erosive reflux esophagitis, patulous EG Junction, no dilation, incomplete EGD due to retained food in stomach. GES thereafter with delayed gastric emptying.    RIGHT HEART CATH N/A 09/27/2017   Procedure: RIGHT HEART CATH;  Surgeon: Jolaine Artist, MD;  Location: [facility];  Service: Cardiovascular;  Laterality: N/A;   UTERINE FIBROID SURGERY       Current Meds  Medication Sig   chlorthalidone (HYGROTON) 25 MG tablet Take 25 mg by mouth daily.     Cholecalciferol (VITAMIN D-3) 5000 units TABS Take by mouth daily.    esomeprazole (NEXIUM) 40 MG capsule Take 40 mg by mouth 2 (two) times daily.   metoCLOPramide (REGLAN) 5 MG tablet Take 1 tablet (5 mg total) by mouth 2 (two) times daily before a meal. (Patient taking differently: Take 5 mg by mouth daily. )   Multiple Vitamins-Minerals (MEGA MULTIVITAMIN) POWD Take 1 Scoop by mouth daily.   mycophenolate (CELLCEPT) 500 MG tablet Tale 3 tablets in the AM (1500mg  total) and 3 tablets in the PM (1500mg )   ondansetron (ZOFRAN) 4 MG tablet Take 4 mg by mouth every 8 (eight) hours as needed for nausea or vomiting. Dispensed 15 with zero refills on 10/08/18   oxyCODONE-acetaminophen (PERCOCET) 10-325 MG tablet Take 1 tablet by mouth every 4 (four) hours as needed for pain. Dispensed 25 with zero refills on 10/08/18   spironolactone (ALDACTONE) 50 MG tablet TAKE 1 TABLET BY MOUTH TWICE A DAY (Patient taking differently: Take 50 mg by mouth daily. )   sulfamethoxazole-trimethoprim (BACTRIM DS) 800-160 MG tablet 1 tablet daily   thyroid (NP THYROID) 120 MG tablet Take 120 mg by mouth daily.     Family History  Problem Relation Age of Onset   Hypertension Father    Hypertension Sister    Hypertension Brother    Diabetes Maternal Grandfather    Diabetes Maternal Uncle    Diabetes Mother    Colon cancer Neg Hx     Social History   Socioeconomic History   Marital status: Single    Spouse name: Not on file   Number of children: 0   Years of education: Not on file   Highest education level: Associate degree: occupational, Hotel manager, or vocational program  Occupational History   Occupation: CMA    Employer: [employer] resource strain: Not on file   Food insecurity:    Worry: Not on file    Inability: Not on file   Transportation needs:    Medical: Not on file    Non-medical: Not on file  Tobacco Use   Smoking status: Never Smoker    Smokeless tobacco: Never Used  Substance and Sexual Activity   Alcohol use: No    Frequency: Never   Drug use: No   Sexual activity: Not on file  Lifestyle   Physical activity:    Days per week: Not on file    Minutes per session: Not on file   Stress: Not on file  Relationships   Social connections:    Talks on phone: Not on file    Gets together: Not on file    Attends religious service: Not on file    Active member of club or organization: Not on file    Attends meetings of clubs or organizations: Not on file    Relationship status: Not on file  Other Topics Concern   Not on file  Social History Narrative   Patient is right-handed. She lives alone in one level home, a few steps to enter.       Review of Systems: Gen: Denies fever, chills CV: Denies chest pain, palpitations Resp: Denies dyspnea at rest, occasional dry cough GI: see HPI  Heme: See HPI  Observations/Objective: No distress. Unable to perform physical exam due to telephone encounter. No video available.   Assessment and Plan:  48 y.o. female with history of GERD, gastroparesis, hypertension, and chronic lung disease. Symptoms of fullness in the setting of gastroparesis with slight improvement on Reglan 5mg  daily, but not adequately managed. Will increase Reglan to 5mg  BID before breakfast and dinner. This should also help with the nausea she is experiencing. Side effects of Reglan discussed and patient is to call if she begins having any involuntary movements or is concerned about possible side effects with this dose increase. Continue Zofran as needed for nausea.  GERD: Well controlled. Occasional reflux with laying down after eating. Continue Nexium BID. Improving gastric emptying with increasing Reglan will also likely help with the occasional symptoms.   Constipation in the setting of Percocet due to recent foot surgery. Will start MiraLAX 17g daily with at least 8 oz of water for 1 week.  Patient is to call if she is not having adequate relief. Patient is following up with podiatry next Wednesday.   Screening colonoscopy overdue. No colonoscopy in the past. No alarm symptoms. No family history of colon cancer or colon polyps. Will proceed with TCS with Dr. [name] in the near future.The risks, benefits, and alternatives have been discussed in detail with patient. They have stated understanding and desire to proceed. We will use propofol with patients history of chronic lung disease.   Follow up in 3 months.   Follow Up Instructions: See AVS   I discussed the assessment and treatment plan with the patient. The patient was provided an opportunity to ask questions and all were answered. The patient agreed with the plan and demonstrated an understanding of the instructions.   The patient was advised to call back or seek an in-person evaluation if the symptoms worsen or if the condition fails to improve as anticipated.  I provided 19 minutes of doxy.me-face-to-face time during this encounter.  Aliene Altes, PA-C [facility]

## 2018-10-11 NOTE — Patient Instructions (Addendum)
1. Increase your Reglan to 5mg  twice daily, once before breakfast and once before dinner. This should help improving your stomach emptying and the constant full feeling you are having. Call if you start having any involuntary movements or are concerned about possible side effects.   2. Continue Zofran as needed for nausea.   3. Continue Nexium 40mg  twice daily for GERD  4. Start taking MiraLAX 1 capful (17g) daily with at least 8 ounces of water for 1 week for constipation. Call if you are not having improvement.   5. We will schedule you for a colonoscopy sometime in August with Dr. [name].   Aliene Altes, PA-C [facility]

## 2018-10-14 ENCOUNTER — Telehealth: Payer: Self-pay

## 2018-10-14 ENCOUNTER — Encounter: Payer: Self-pay | Admitting: Internal Medicine

## 2018-10-14 NOTE — Progress Notes (Signed)
CC'D TO PCP °

## 2018-10-14 NOTE — Telephone Encounter (Signed)
Tried to call pt to schedule TCS w/Propofol w/RMR, no answer, LMOVM for return call.

## 2018-10-15 ENCOUNTER — Telehealth: Payer: Self-pay | Admitting: Pulmonary Disease

## 2018-10-15 NOTE — Telephone Encounter (Signed)
Kim  From [facility] is calling back [phone]

## 2018-10-15 NOTE — Telephone Encounter (Signed)
Papers hand delivered to Dr Vaughan Browner. After he reviewed, papers were placed in scan folder.

## 2018-10-15 NOTE — Telephone Encounter (Signed)
Kim called back, she is inquiring about this patient's OFEV. She states [facility] in the specialty pharmacy that has been working with the patient on this. [employer] program. Patient assistance. Their clinical educator has called the patient and let her know what she needs to do to get enrolled for this. Maudie Mercury states she will have [employer] fax those forms. She also wanted to send some info regarding ofev and contacts and things that will help get patients approved. She wanted be sure Dr. Vaughan Browner as well as his nurse was aware that she has been denied several times but they think she may be able to be approved for free. Made aware I would route this information to Dr. Vaughan Browner and his nurse as Juluis Rainier.

## 2018-10-15 NOTE — Telephone Encounter (Signed)
LMTCB. Please ask her what is her call in reference to since we seem to keep missing each other or have someone from triage speak to her directly.

## 2018-10-15 NOTE — Telephone Encounter (Signed)
LMTCB

## 2018-10-16 ENCOUNTER — Ambulatory Visit (INDEPENDENT_AMBULATORY_CARE_PROVIDER_SITE_OTHER): Payer: 59

## 2018-10-16 ENCOUNTER — Encounter: Payer: Self-pay | Admitting: Podiatry

## 2018-10-16 ENCOUNTER — Ambulatory Visit (INDEPENDENT_AMBULATORY_CARE_PROVIDER_SITE_OTHER): Payer: 59 | Admitting: Podiatry

## 2018-10-16 ENCOUNTER — Other Ambulatory Visit: Payer: Self-pay

## 2018-10-16 VITALS — Temp 98.2°F

## 2018-10-16 DIAGNOSIS — M2041 Other hammer toe(s) (acquired), right foot: Secondary | ICD-10-CM

## 2018-10-16 DIAGNOSIS — Z09 Encounter for follow-up examination after completed treatment for conditions other than malignant neoplasm: Secondary | ICD-10-CM

## 2018-10-16 DIAGNOSIS — M2042 Other hammer toe(s) (acquired), left foot: Secondary | ICD-10-CM | POA: Diagnosis not present

## 2018-10-16 NOTE — Progress Notes (Signed)
This patient presents the office following surgery on her left foot.  Patient had surgery to correct a bunion left  foot and a hammertoe correction 2 through 5 left  foot.  Surgery was done by Dr. Paulla Dolly.  Patient states that she had significant pain following the surgery but her pain has diminished and eased off since surgery.  She says she has been very hesitant upon walking and placing weight through her surgical forefoot.  She says she is ambulating with the cam walker when she walks but has elevated her left foot and rested her foot following the surgery.  She presents the office today wearing the bandage that was applied at the surgical center.  She presents the office today for an evaluation and treatment of her surgical left foot.  Neurovascular status intact for this patient.  Good wound coaptation and healing noted at the incisions over the first metatarsal and digits 2 through 5 right foot.  No evidence of any redness or drainage at the incision sites.  The K wires are intact 2 through 4 right foot.  Patient has good range of motion of the first MPJ of the right foot with no unrestricted motion in the big toe joint left foot.  S/P foot surgery  ROV.  Discussed this patient's foot following the surgery.  Told her her surgical sites are healing well and no evidence of an infection is noted.  The left foot was re-bandaged and she was told to keep the bandage clean and dry.  She is also told to ambulate with the cam walker until she is seen back in the office.  Patient to call the office if any problems occur.   Gardiner Barefoot DPM

## 2018-10-17 NOTE — Telephone Encounter (Signed)
Tried to call pt, no answer, LMOVM for return call.  

## 2018-10-18 NOTE — Telephone Encounter (Signed)
Letter mailed to pt.  

## 2018-10-24 ENCOUNTER — Other Ambulatory Visit: Payer: Self-pay

## 2018-10-24 ENCOUNTER — Telehealth: Payer: Self-pay

## 2018-10-24 DIAGNOSIS — Z1211 Encounter for screening for malignant neoplasm of colon: Secondary | ICD-10-CM

## 2018-10-24 MED ORDER — CLENPIQ 10-3.5-12 MG-GM -GM/160ML PO SOLN: 1.0000 | Freq: Once | ORAL | 0 refills | Status: AC

## 2018-10-24 MED FILL — CLENPIQ 10-3.5-12 MG-GM -GM: 10-3.5-12 M | 1 days supply | Qty: 320 | Fill #0

## 2018-10-24 NOTE — Telephone Encounter (Signed)
Pt received letter to call office to schedule TCS. TCS w/Propofol w/RMR scheduled for 01/02/19 at 12:30pm. Rx for prep sent to pharmacy. Orders entered.

## 2018-10-24 NOTE — Telephone Encounter (Signed)
PA for TCS submitted via [facility] website. Case ID# [id number].

## 2018-10-25 NOTE — Telephone Encounter (Signed)
Pre-op appt 12/30/18 at 2:45pm. Appt letter mailed with procedure instructions.

## 2018-10-28 NOTE — Telephone Encounter (Signed)
Received email from [facility], no PA needed for TCS. Code [phone] does not require prior authorization for this member

## 2018-10-30 ENCOUNTER — Ambulatory Visit (INDEPENDENT_AMBULATORY_CARE_PROVIDER_SITE_OTHER): Payer: 59 | Admitting: Podiatry

## 2018-10-30 ENCOUNTER — Other Ambulatory Visit: Payer: Self-pay

## 2018-10-30 ENCOUNTER — Encounter: Payer: Self-pay | Admitting: Podiatry

## 2018-10-30 ENCOUNTER — Ambulatory Visit (INDEPENDENT_AMBULATORY_CARE_PROVIDER_SITE_OTHER): Payer: 59

## 2018-10-30 DIAGNOSIS — Z09 Encounter for follow-up examination after completed treatment for conditions other than malignant neoplasm: Secondary | ICD-10-CM

## 2018-10-30 DIAGNOSIS — M21619 Bunion of unspecified foot: Secondary | ICD-10-CM

## 2018-10-30 DIAGNOSIS — M2042 Other hammer toe(s) (acquired), left foot: Secondary | ICD-10-CM

## 2018-10-30 DIAGNOSIS — M2041 Other hammer toe(s) (acquired), right foot: Secondary | ICD-10-CM

## 2018-10-30 NOTE — Patient Instructions (Signed)
Pre-Operative Instructions  Congratulations, you have decided to take an important step towards improving your quality of life.  You can be assured that the doctors and staff at [facility] will be with you every step of the way.  Here are some important things you should know:  1. Plan to be at the surgery center/hospital at least 1 (one) hour prior to your scheduled time, unless otherwise directed by the surgical center/hospital staff.  You must have a responsible adult accompany you, remain during the surgery and drive you home.  Make sure you have directions to the surgical center/hospital to ensure you arrive on time. 2. If you are having surgery at [facility] or [facility], you will need a copy of your medical history and physical form from your family physician within one month prior to the date of surgery. We will give you a form for your primary physician to complete.  3. We make every effort to accommodate the date you request for surgery.  However, there are times where surgery dates or times have to be moved.  We will contact you as soon as possible if a change in schedule is required.   4. No aspirin/ibuprofen for one week before surgery.  If you are on aspirin, any non-steroidal anti-inflammatory medications (Mobic, Aleve, Ibuprofen) should not be taken seven (7) days prior to your surgery.  You make take Tylenol for pain prior to surgery.  5. Medications - If you are taking daily heart and blood pressure medications, seizure, reflux, allergy, asthma, anxiety, pain or diabetes medications, make sure you notify the surgery center/hospital before the day of surgery so they can tell you which medications you should take or avoid the day of surgery. 6. No food or drink after midnight the night before surgery unless directed otherwise by surgical center/hospital staff. 7. No alcoholic beverages 24-hours prior to surgery.  No smoking 24-hours prior or 24-hours after  surgery. 8. Wear loose pants or shorts. They should be loose enough to fit over bandages, boots, and casts. 9. Don't wear slip-on shoes. Sneakers are preferred. 10. Bring your boot with you to the surgery center/hospital.  Also bring crutches or a walker if your physician has prescribed it for you.  If you do not have this equipment, it will be provided for you after surgery. 11. If you have not been contacted by the surgery center/hospital by the day before your surgery, call to confirm the date and time of your surgery. 12. Leave-time from work may vary depending on the type of surgery you have.  Appropriate arrangements should be made prior to surgery with your employer. 13. Prescriptions will be provided immediately following surgery by your doctor.  Fill these as soon as possible after surgery and take the medication as directed. Pain medications will not be refilled on weekends and must be approved by the doctor. 14. Remove nail polish on the operative foot and avoid getting pedicures prior to surgery. 15. Wash the night before surgery.  The night before surgery wash the foot and leg well with water and the antibacterial soap provided. Be sure to pay special attention to beneath the toenails and in between the toes.  Wash for at least three (3) minutes. Rinse thoroughly with water and dry well with a towel.  Perform this wash unless told not to do so by your physician.  Enclosed: 1 Ice pack (please put in freezer the night before surgery)   1 Hibiclens skin cleaner     Pre-op instructions  If you have any questions regarding the instructions, please do not hesitate to call our office.  [place]: [address], [place], [place] [address] -- [phone]  [place]: [address] [place], [place] [address] -- [phone]  [place]: [address]  [place], [place] [address] -- [phone]  [place]: [address], [address], [place], [place] [address] -- [phone]  Website:  [email or web address] 

## 2018-11-03 NOTE — Progress Notes (Signed)
Subjective:   Patient ID: Karen [name], female   DOB: 48 y.o.   MRN: [id number]   HPI Patient states that her foot is feeling much better and is having only mild discomfort if she is been on it too long   ROS      Objective:  Physical Exam  Neurovascular status intact with patient's left foot showing good alignment with the hallux in rectus position pins in place and good digital alignment with stitches intact     Assessment:  Overall doing well post forefoot reconstruction left     Plan:  H&P x-ray reviewed stitches removed sterile dressing reapplied and discussed continued immobilization elevation compression.  Reappoint 2 weeks pin removal  X-rays indicate fixation looks good structural alignment looks good

## 2018-11-04 ENCOUNTER — Encounter: Payer: Self-pay | Admitting: Pulmonary Disease

## 2018-11-04 ENCOUNTER — Ambulatory Visit: Payer: 59 | Admitting: Pulmonary Disease

## 2018-11-04 ENCOUNTER — Other Ambulatory Visit: Payer: Self-pay

## 2018-11-04 ENCOUNTER — Other Ambulatory Visit: Payer: 59

## 2018-11-04 DIAGNOSIS — J849 Interstitial pulmonary disease, unspecified: Secondary | ICD-10-CM

## 2018-11-04 LAB — COMPREHENSIVE METABOLIC PANEL
ALT: 12 U/L (ref 0–35)
AST: 17 U/L (ref 0–37)
Albumin: 4.3 g/dL (ref 3.5–5.2)
Alkaline Phosphatase: 74 U/L (ref 39–117)
BUN: 12 mg/dL (ref 6–23)
CO2: 31 mEq/L (ref 19–32)
Calcium: 10.9 mg/dL — ABNORMAL HIGH (ref 8.4–10.5)
Chloride: 99 mEq/L (ref 96–112)
Creatinine, Ser: 0.66 mg/dL (ref 0.40–1.20)
GFR: 114.48 mL/min (ref >60.00)
Glucose, Bld: 85 mg/dL (ref 70–99)
Potassium: 3.8 mEq/L (ref 3.5–5.1)
Sodium: 137 mEq/L (ref 135–145)
Total Bilirubin: 0.4 mg/dL (ref 0.2–1.2)
Total Protein: 8.3 g/dL (ref 6.0–8.3)

## 2018-11-04 LAB — CBC WITH DIFFERENTIAL/PLATELET
Basophils Absolute: 0.1 10*3/uL (ref 0.0–0.1)
Basophils Relative: 0.7 % (ref 0.0–3.0)
Eosinophils Absolute: 0.3 10*3/uL (ref 0.0–0.7)
Eosinophils Relative: 2.9 % (ref 0.0–5.0)
HCT: 39.3 % (ref 36.0–46.0)
Hemoglobin: 12.6 g/dL (ref 12.0–15.0)
Lymphocytes Relative: 24.8 % (ref 12.0–46.0)
Lymphs Abs: 2.2 10*3/uL (ref 0.7–4.0)
MCHC: 32.1 g/dL (ref 30.0–36.0)
MCV: 86.2 fl (ref 78.0–100.0)
Monocytes Absolute: 1 10*3/uL (ref 0.1–1.0)
Monocytes Relative: 10.8 % (ref 3.0–12.0)
Neutro Abs: 5.4 10*3/uL (ref 1.4–7.7)
Neutrophils Relative %: 60.8 % (ref 43.0–77.0)
Platelets: 278 10*3/uL (ref 150.0–400.0)
RBC: 4.56 Mil/uL (ref 3.87–5.11)
RDW: 14 % (ref 11.5–15.5)
WBC: 8.9 10*3/uL (ref 4.0–10.5)

## 2018-11-04 NOTE — Progress Notes (Signed)
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 Haroldine Laws on 09/27/17 with no evidence of pulmonary hypertension Started on CellCept May 2019 and uptitrated to max dose of 1.5 mg twice daily.  She had a hospitalization in [facility] in March 2019 for hypertension.  A CT chest scan at that time showed subtle lower lung findings of atelectasis, groundglass.  She has a right thyroid nodule which was biopsied on 08/15/17 with pathology showing benign findings.  Thyroid function tests are normal.  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  Reviewed note from Dr. Trudie Reed, rheumatology 07/12/2017 Seen for joint pain, Raynaud's, elevated CK, skin thickening Serologies positive for Sjogren's negative,, normal CK and aldolase.  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  No mold. No hot tub, Jacuzzi. She has down comforter.  Smoking history: Never smoker Travel history: Grew up in [place].  Lived in [place] and [place] Relevant family history: No family history of lung disease.    Interim history: Continues on CellCept 1.5 mg twice daily.   Stable respiratory status.  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  We had tried to get her on Ofev but has been denied thrice by insurance company.  She is currently working through [employer] cares program to get patient assistance for therapy.  Recently underwent foot surgery  without any complications  Outpatient Encounter Medications as of 11/04/2018  Medication Sig  . chlorthalidone (HYGROTON) 25 MG tablet Take 25 mg by mouth daily.   . Cholecalciferol (VITAMIN D-3) 5000 units TABS Take by mouth daily.   Marland Kitchen esomeprazole (NEXIUM) 40 MG capsule Take 40 mg by mouth 2 (two) times daily.  . metoCLOPramide (REGLAN) 5 MG tablet Take 1 tablet (5 mg total) by mouth 2 (two) times daily before a meal. (Patient taking differently: Take 5 mg by mouth daily. )  . Multiple Vitamins-Minerals (MEGA MULTIVITAMIN) POWD Take 1 Scoop by mouth daily.  . mycophenolate (CELLCEPT) 500 MG tablet Tale 3 tablets in the AM (1500mg  total) and 3 tablets in the PM (1500mg )  . spironolactone (ALDACTONE) 50 MG tablet TAKE 1 TABLET BY MOUTH TWICE A DAY (Patient taking differently: Take 50 mg by mouth daily. )  . sulfamethoxazole-trimethoprim (BACTRIM DS) 800-160 MG tablet 1 tablet daily  . thyroid (NP THYROID) 120 MG tablet Take 120 mg by mouth daily.  . [DISCONTINUED] ondansetron (ZOFRAN) 4 MG tablet Take 4 mg by mouth every 8 (eight) hours as needed for nausea or vomiting. Dispensed 15 with zero refills on 10/08/18  . [DISCONTINUED] oxyCODONE-acetaminophen (PERCOCET) 10-325 MG tablet Take 1 tablet by mouth every 4 (four) hours as needed for pain. Dispensed 25 with zero refills on 10/08/18   No facility-administered encounter medications on file as of 11/04/2018.     Physical Exam: Blood pressure 126/80, pulse (!) 101, temperature 98.1 F (36.7 C), temperature source Oral, height 5\' 5"  (1.651 m), weight 125 lb 12.8 oz (57.1 kg), SpO2 99 %.  Gen:      No acute distress HEENT:  EOMI, sclera anicteric Neck:     No masses; no thyromegaly Lungs:    Clear to auscultation bilaterally; normal respiratory effort CV:         Regular rate and rhythm; no murmurs Abd:      + bowel sounds; soft, non-tender; no palpable masses, no distension Ext:    No edema; adequate peripheral perfusion Skin:      Warm and dry;  no rash Neuro: alert and oriented x 3 Psych: normal mood and affect  Data Reviewed: Imaging CT chest, [facility] 07/24/2017 Dependent atelectasis, subtle groundglass opacities at the base.  2.5 cm right thyroid nodule.  I have reviewed the images personally  CT high-resolution 09/18/2017- patchy reticular groundglass opacities, reticulation with subpleural sparing.  Mild traction bronchiectasis.  No honeycombing.  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  Aortic atherosclerosis, three-vessel coronary artery disease.  CT high-resolution 06/27/2018-stable interstitial lung disease. I reviewed the images personally.  PFTs FENO 09/06/2017-unable to complete  07/27/2017 FVC 1.65 (54%), FEV1 1.53 (62%], F/F 93, TLC 50, DLCO 44%, DLCO/VA 131% Severe restriction and diffusion impairment.  10/25/2017 FVC 1.51 [50%], FEV1 1.26 [51%], F/F 83 Severe restriction, unable to complete DLCO  01/29/2018 FVC 1.69 [5%), FEV1 1.50 [61%], F/F 89, DLCO 10.57 [41%] Severe restriction and diffusion impairment.  07/01/2018 FVC 1.57 [52%), FEV1 1.47 [60%], F/F 93, DLCO 12.54 [59%)  6-minute walk  10/23/2017- 144 m Post walk heart rate, stats 94, 91%  02/04/2018- 249 m Post walk heart rate, sats 101, 99%  06/06/2018-212 m Post walk heart rate, sats 86, 92%  Labs 08/20/2017-ANA, centromere, rheumatoid factor, SCL 70-negative QuantiFERON 09/12/2017-negative Hepatitis B, C screening 09/21/2017-negative G6PD 09/25/2017-13  Labs from Dr. Trudie Reed 07/12/17 Rheumatoid factor < 10, CCP-8, ANCA-negative, double-stranded DNA-negative, Smith antibody-negative, SCL 70-negative, RNP-negative SSA 5.8, SSB 1.5 Smith antibody-negative, ANA IFA-negative Myositis panel-negative CK 165, C4-30, C3-168, total complement greater than 60, aldolase 8.8 Sed rate 9, CRP 0.9  CBC, hepatic function panel 06/17/2018-within normal limits.  Cardiac Cardiac  cath 09/27/17 RA = 2 RV = 33/6 PA = 32/10 (19) PCW = 6 Fick cardiac output/index = 5.0/3.0 PVR = 2.6 Ao sat = 99% PA sat = 74%, 75%  Assessment:  Scleroderma ILD Reviewed the CT scan which shows NSIP fibrosis which is consistent with scleroderma related interstitial lung disease. Although the ILD appears mild there is significant reduction in her PFTs with restriction and diffusion impairment.  Continue on CellCept 1.5 g twice daily, Bactrim prophylaxis.  Check CBC and CMP today Monitor with high-res CT, PFTs and 6-minute walk test in 3 months.  Will work through [employer] for [employer] She will benefit from anti-fibrotic based on SENSICUS trial.  [email or web address]://www.nejm.org/doi/10.1056/NEJMoa1903076  She has been referred to lung transplant at [facility] but clinic visit has been on hold on hold due to recent foot surgery.  Esophageal dysfunction GERD Continue on Protonix 20 mg twice daily Follows with [facility] gastroenterology She is due to get dilatation of esophageal stricture in the near future.  Plan/Recommendations: - Continue CellCept at current dose of 1.5 twice daily. - CBC, CMP - CT, PFTs, 6-minute walk test in 3 months - Bactrim prophylaxis. - Paperwork for Mertie Clause MD [facility] 11/04/2018, 11:37 AM

## 2018-11-04 NOTE — Patient Instructions (Signed)
We will order a follow-up high-resolution CT, spirometry, diffusion capacity and 6-minute walk test in 3 months time.  Follow-up in clinic after these tests.

## 2018-11-06 ENCOUNTER — Encounter: Payer: Self-pay | Admitting: Podiatry

## 2018-11-07 ENCOUNTER — Telehealth: Payer: Self-pay | Admitting: *Deleted

## 2018-11-07 NOTE — Telephone Encounter (Signed)
DOS 11/12/2018; [address]36 - Karen [name], [id number] - METATARSAL OSTEOTOMY 5TH, AND HAMMER TOE REPAIR 2,3,4 W/ PIN AND 5TH RT. FOOT  UMR: Effective Date - 05/08/2018  Benefit percentage  60%Plan pays  40%You pay  Individual deductible  $0.00 to go     $300.00 out of $300         Individual integrated out-of-pocket  $5,664.63 to go     $2,235.37 out of $7900.00

## 2018-11-08 ENCOUNTER — Other Ambulatory Visit (HOSPITAL_COMMUNITY): Payer: Self-pay | Admitting: Internal Medicine

## 2018-11-08 MED FILL — SUNMARK BP KIT: 30 days supply | Qty: 1 | Fill #0

## 2018-11-12 ENCOUNTER — Encounter: Payer: Self-pay | Admitting: Podiatry

## 2018-11-12 DIAGNOSIS — M21611 Bunion of right foot: Secondary | ICD-10-CM | POA: Diagnosis not present

## 2018-11-12 DIAGNOSIS — M2011 Hallux valgus (acquired), right foot: Secondary | ICD-10-CM | POA: Diagnosis not present

## 2018-11-12 DIAGNOSIS — M2041 Other hammer toe(s) (acquired), right foot: Secondary | ICD-10-CM | POA: Diagnosis not present

## 2018-11-12 DIAGNOSIS — K219 Gastro-esophageal reflux disease without esophagitis: Secondary | ICD-10-CM | POA: Diagnosis not present

## 2018-11-12 DIAGNOSIS — M21541 Acquired clubfoot, right foot: Secondary | ICD-10-CM | POA: Diagnosis not present

## 2018-11-12 DIAGNOSIS — M21621 Bunionette of right foot: Secondary | ICD-10-CM | POA: Diagnosis not present

## 2018-11-13 MED FILL — SPIRONOLACTONE 50 MG TABS: 50 | 30 days supply | Qty: 60 | Fill #0

## 2018-11-15 ENCOUNTER — Telehealth: Payer: Self-pay | Admitting: *Deleted

## 2018-11-15 ENCOUNTER — Encounter: Payer: Self-pay | Admitting: Neurology

## 2018-11-15 ENCOUNTER — Telehealth (INDEPENDENT_AMBULATORY_CARE_PROVIDER_SITE_OTHER): Payer: 59 | Admitting: Neurology

## 2018-11-15 ENCOUNTER — Other Ambulatory Visit: Payer: Self-pay

## 2018-11-15 VITALS — Ht 65.0 in | Wt 125.0 lb

## 2018-11-15 DIAGNOSIS — G63 Polyneuropathy in diseases classified elsewhere: Secondary | ICD-10-CM | POA: Diagnosis not present

## 2018-11-15 NOTE — Telephone Encounter (Signed)
Called and spoke with the patient and patient stated that she was doing ok and the boot was not too tight and just a little pain and there was no fever or chills and nausea and doing the pain medicine as directed and I stated to call the office if any concerns or questions. Lattie Haw

## 2018-11-15 NOTE — Progress Notes (Signed)
   Virtual Visit via Video Note The purpose of this virtual visit is to provide medical care while limiting exposure to the novel coronavirus.    Consent was obtained for video visit:  Yes.   Answered questions that patient had about telehealth interaction:  Yes.   I discussed the limitations, risks, security and privacy concerns of performing an evaluation and management service by telemedicine. I also discussed with the patient that there may be a patient responsible charge related to this service. The patient expressed understanding and agreed to proceed.  Pt location: Home Physician Location: office Name of referring provider:  [name], MD I connected with Karen [name] at patients initiation/request on 11/15/2018 at  3:30 PM EDT by video enabled telemedicine application and verified that I am speaking with the correct person using two identifiers. Pt MRN:  [id number] Pt DOB:  10-08-1970 Video Participants:  Karen [name]   History of Present Illness: This is a 47 y.o. female returning for follow-up of neuropathy.  There has been no significant change in the numbness of her feet.  She denies any pain or imbalance.  Her scleroderma is stable.  She recently underwent foot surgery for bunionectomy and hammertoe and is recovering from this.   Observations/Objective:   Vitals:   11/15/18 1413  Weight: 125 lb (56.7 kg)  Height: 5\' 5"  (1.651 m)   Patient is awake, alert, and appears comfortable.  Oriented x 4.   Extraocular muscles are intact. No ptosis.  Face is symmetric.  Speech is not dysarthric. Tongue is midline. Antigravity in all extremities.   DATA:   NCS/EMG of the legs 05/30/2018: chronic and symmetric sensorimotor axonal polyneuropathy affecting the lower extremities, which is severe in degree electrically.  Assessment and Plan:  Peripheral neuropathy in the setting of scleroderma. NCS/EMG results show severe sensorimotor polyneuropathy in the legs.  Overall,  symptoms are stable with distal numbness in the feet.  She does not have any painful paresthesias.  Again, she was reminded to check her feet daily and take extra caution when walking on uneven ground.  I will continue to monitor clinically.   Follow Up Instructions:   I discussed the assessment and treatment plan with the patient. The patient was provided an opportunity to ask questions and all were answered. The patient agreed with the plan and demonstrated an understanding of the instructions.   The patient was advised to call back or seek an in-person evaluation if the symptoms worsen or if the condition fails to improve as anticipated.  Follow-up in 9 months  Total time spent:  15 minutes     Alda Berthold, DO

## 2018-11-18 ENCOUNTER — Encounter (HOSPITAL_COMMUNITY): Payer: Self-pay | Admitting: Internal Medicine

## 2018-11-18 ENCOUNTER — Encounter: Payer: Self-pay | Admitting: Podiatry

## 2018-11-18 ENCOUNTER — Ambulatory Visit (HOSPITAL_BASED_OUTPATIENT_CLINIC_OR_DEPARTMENT_OTHER)
Admission: RE | Admit: 2018-11-18 | Discharge: 2018-11-18 | Disposition: A | Discharge status: Home / Self Care | Payer: 59 | Source: Ambulatory Visit | Attending: Internal Medicine | Admitting: Internal Medicine

## 2018-11-18 ENCOUNTER — Other Ambulatory Visit: Payer: Self-pay

## 2018-11-18 ENCOUNTER — Ambulatory Visit (INDEPENDENT_AMBULATORY_CARE_PROVIDER_SITE_OTHER): Payer: 59 | Admitting: Podiatry

## 2018-11-18 ENCOUNTER — Ambulatory Visit (INDEPENDENT_AMBULATORY_CARE_PROVIDER_SITE_OTHER): Payer: 59

## 2018-11-18 ENCOUNTER — Ambulatory Visit (HOSPITAL_COMMUNITY)
Admission: RE | Admit: 2018-11-18 | Discharge: 2018-11-18 | Disposition: A | Discharge status: Home / Self Care | Payer: 59 | Source: Ambulatory Visit | Attending: Internal Medicine | Admitting: Internal Medicine

## 2018-11-18 VITALS — BP 130/92 | HR 110

## 2018-11-18 VITALS — Temp 98.0°F

## 2018-11-18 DIAGNOSIS — M3500 Sicca syndrome, unspecified: Secondary | ICD-10-CM | POA: Diagnosis not present

## 2018-11-18 DIAGNOSIS — M2041 Other hammer toe(s) (acquired), right foot: Secondary | ICD-10-CM | POA: Diagnosis not present

## 2018-11-18 DIAGNOSIS — Z09 Encounter for follow-up examination after completed treatment for conditions other than malignant neoplasm: Secondary | ICD-10-CM

## 2018-11-18 DIAGNOSIS — M349 Systemic sclerosis, unspecified: Secondary | ICD-10-CM | POA: Diagnosis not present

## 2018-11-18 DIAGNOSIS — I10 Essential (primary) hypertension: Secondary | ICD-10-CM | POA: Insufficient documentation

## 2018-11-18 DIAGNOSIS — M21619 Bunion of unspecified foot: Secondary | ICD-10-CM

## 2018-11-18 DIAGNOSIS — M2042 Other hammer toe(s) (acquired), left foot: Secondary | ICD-10-CM | POA: Diagnosis not present

## 2018-11-18 DIAGNOSIS — R000 Tachycardia, unspecified: Secondary | ICD-10-CM | POA: Diagnosis not present

## 2018-11-18 DIAGNOSIS — R0600 Dyspnea, unspecified: Secondary | ICD-10-CM | POA: Insufficient documentation

## 2018-11-18 DIAGNOSIS — Z888 Allergy status to other drugs, medicaments and biological substances status: Secondary | ICD-10-CM | POA: Diagnosis not present

## 2018-11-18 DIAGNOSIS — Z79890 Hormone replacement therapy (postmenopausal): Secondary | ICD-10-CM | POA: Diagnosis not present

## 2018-11-18 DIAGNOSIS — E042 Nontoxic multinodular goiter: Secondary | ICD-10-CM | POA: Diagnosis not present

## 2018-11-18 DIAGNOSIS — Z79899 Other long term (current) drug therapy: Secondary | ICD-10-CM | POA: Diagnosis not present

## 2018-11-18 DIAGNOSIS — Z833 Family history of diabetes mellitus: Secondary | ICD-10-CM | POA: Insufficient documentation

## 2018-11-18 DIAGNOSIS — Z8249 Family history of ischemic heart disease and other diseases of the circulatory system: Secondary | ICD-10-CM | POA: Diagnosis not present

## 2018-11-18 DIAGNOSIS — J8410 Pulmonary fibrosis, unspecified: Secondary | ICD-10-CM | POA: Diagnosis not present

## 2018-11-18 LAB — TSH: TSH: 2.842 u[IU]/mL (ref 0.350–4.500)

## 2018-11-18 LAB — T4, FREE: Free T4: 0.94 ng/dL (ref 0.61–1.12)

## 2018-11-18 MED ORDER — SPIRONOLACTONE 25 MG PO TABS: 25.0000 mg | ORAL_TABLET | Freq: Every day | ORAL | 6 refills | Status: DC

## 2018-11-18 MED ORDER — VERAPAMIL HCL ER 180 MG PO TBCR: 180.0000 mg | EXTENDED_RELEASE_TABLET | Freq: Every day | ORAL | 6 refills | Status: DC

## 2018-11-18 MED FILL — VERAPAMIL ER 180 MG TABLET: 180 | 30 days supply | Qty: 30 | Fill #0

## 2018-11-18 NOTE — Progress Notes (Signed)
Appointment scheduled.

## 2018-11-18 NOTE — Patient Instructions (Signed)
Stop Chlorthalidone  Decrease Spironolactone to 25 mg daily  Start Verapamil CR 180 mg daily  Labs done today, we will notify you if labs are abnormal  Your physician recommends that you schedule a follow-up appointment in: 2-3 months  At the [facility], you and your health needs are our priority. As part of our continuing mission to provide you with exceptional heart care, we have created designated Provider Care Teams. These Care Teams include your primary Cardiologist (physician) and Advanced Practice Providers (APPs- Physician Assistants and Nurse Practitioners) who all work together to provide you with the care you need, when you need it.   You may see any of the following providers on your designated Care Team at your next follow up: Marland Kitchen Dr Glori Bickers . Dr Loralie Champagne . Darrick Grinder, NP

## 2018-11-18 NOTE — Progress Notes (Signed)
Advanced Heart Failure Clinic Note   Referring Physician: Dr Posey Rea PCP: [name], MD PCP-Cardiologist: Mertie Moores, MD   HPI: Karen [name] is a 48 y.o. female [place] at [facility] (with [name]) with a history of Sjogren's syndrome, multinodular goiter, HTN and recently diagnosed scleroderma referred by Dr. Gavin Pound for evaluation of pulmonary HTN in the setting of scleroderma.  RHC in 5/19 with minimally elevated pressures and hi-res CT which showed ILD. Follows with Dr. Vaughan Browner . F/u CT in 2/20 showed stable ILD  Remains weak and SOB. Recently had surgery on both feet to straighten her toes. Can do ADls without too much problem but gets SOB and tired with going to store or going up steps. Remains tachycardic. Taking BP every day usually SBP 110-120 with HRs 100-115. Occasional edema. No orthopnea or PND. Says she tries to drink enough fluid. No presyncope/syncope.   Echo today 11/18/18: EF hyperdynamic 65-70% RV normal   Echo 5/19: EF 60-65% grade I DD RV normal. Mild TR.   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  Aortic atherosclerosis, three-vessel coronary artery disease.  CT high-resolution 06/27/2018-stable interstitial lung disease. I reviewed the images personally.  PFTs  07/27/2017 FVC 1.65 (54%), FEV1 1.53 (62%], F/F 93, TLC 50, DLCO 44%, DLCO/VA 131% Severe restriction and diffusion impairment.  10/25/2017 FVC 1.51 [50%], FEV1 1.26 [51%], F/F 83 Severe restriction, unable to complete DLCO  01/29/2018 FVC 1.69 [5%), FEV1 1.50 [61%], F/F 89, DLCO 10.57 [41%] Severe restriction and diffusion impairment.  07/01/2018 FVC 1.57 [52%), FEV1 1.47 [60%], F/F 93, DLCO 12.54 [59%)  6-minute walk  10/23/2017- 144 m  Post walk heart rate, stats 94, 91%  02/04/2018- 249 m Post walk heart rate, sats 101, 99%  06/06/2018-212 m Post walk heart rate, sats 86, 92%   Past Medical History:  Diagnosis Date  . Hypertension   . ILD (interstitial lung disease) ([place]) 02/08/2018  . Interstitial lung disease ([place])   . Multinodular thyroid    benign FNA 08/2017.  Marland Kitchen Scleroderma ([place])     Current Outpatient Medications  Medication Sig Dispense Refill  . chlorthalidone (HYGROTON) 25 MG tablet Take 25 mg by mouth daily.     . Cholecalciferol (VITAMIN D-3) 5000 units TABS Take by mouth daily.     Marland Kitchen esomeprazole (NEXIUM) 40 MG capsule Take 40 mg by mouth 2 (two) times daily.  5  . metoCLOPramide (REGLAN) 5 MG tablet Take 1 tablet (5 mg total) by mouth 2 (two) times daily before a meal. (Patient taking differently: Take 5 mg by mouth daily. ) 60 tablet 3  . Multiple Vitamins-Minerals (MEGA MULTIVITAMIN) POWD Take 1 Scoop by mouth daily.    . mycophenolate (CELLCEPT) 500 MG tablet Tale 3 tablets in the AM (1500mg  total) and 3 tablets in the PM (1500mg ) 170 tablet 1  . spironolactone (ALDACTONE) 50 MG tablet TAKE 1 TABLET (50 MG TOTAL) BY MOUTH 2 (TWO) TIMES DAILY. 60 tablet 0  . sulfamethoxazole-trimethoprim (BACTRIM DS) 800-160 MG tablet 1 tablet daily 30 tablet 3  . thyroid (NP THYROID) 120 MG tablet Take 120 mg by mouth daily.     No current facility-administered medications for this encounter.  Allergies  Allergen Reactions  . Lisinopril Hives and Swelling      Social History   Socioeconomic History  . Marital status: Single    Spouse name: Not on file  . Number of children: 0  . Years of education: Not on file  . Highest education level: Associate degree: occupational, Hotel manager, or vocational program  Occupational History  . Occupation: CMA    Employer: [employer]  Social Needs  . Financial resource strain: Not on file  . Food insecurity    Worry: Not on file    Inability: Not on  file  . Transportation needs    Medical: Not on file    Non-medical: Not on file  Tobacco Use  . Smoking status: Never Smoker  . Smokeless tobacco: Never Used  Substance and Sexual Activity  . Alcohol use: No    Frequency: Never  . Drug use: No  . Sexual activity: Not on file  Lifestyle  . Physical activity    Days per week: Not on file    Minutes per session: Not on file  . Stress: Not on file  Relationships  . Social Herbalist on phone: Not on file    Gets together: Not on file    Attends religious service: Not on file    Active member of club or organization: Not on file    Attends meetings of clubs or organizations: Not on file    Relationship status: Not on file  . Intimate partner violence    Fear of current or ex partner: Not on file    Emotionally abused: Not on file    Physically abused: Not on file    Forced sexual activity: Not on file  Other Topics Concern  . Not on file  Social History Narrative   Patient is right-handed. She lives alone in one level home, a few steps to enter.      Family History  Problem Relation Age of Onset  . Hypertension Father   . Hypertension Sister   . Hypertension Brother   . Diabetes Maternal Grandfather   . Diabetes Maternal Uncle   . Diabetes Mother   . Colon cancer Neg Hx     Vitals:   11/18/18 1222  BP: (!) 130/92  Pulse: (!) 110  SpO2: 97%    PHYSICAL EXAM: General:  Weak appearing. No resp difficulty HEENT: normal Neck: supple. JVP 5-6. Carotids 2+ bilat; no bruits. No lymphadenopathy or thryomegaly appreciated. Cor: PMI nondisplaced. Tachy regular  No murmur Lungs: clear Abdomen: soft, nontender, nondistended. No hepatosplenomegaly. No bruits or masses. Good bowel sounds. Extremities: no cyanosis, clubbing, rash, edema  + feet in boots Neuro: alert & orientedx3, cranial nerves grossly intact. moves all 4 extremities w/o difficulty. Affect pleasant  ASSESSMENT & PLAN:  1. Dyspnea - Suspect  this is mostly related to her scleroderma related ILD. [place] 5/19 with minimally elevated PA pressures  - Echo 5/19: EF 60-65% grade I DD RV normal. Mild TR.  - Echo today 11/18/18: EF hyperdynamic 65-70% RV normal Grade 1 DD - We discussed need for repeat RHC to assess for development of PAH but with recent foot surgery we will put off and see her back in 2-3 months and schedule at that time. RV normal on echo today  2. HTN - BP well controlled at home but LV appears underfilled on echo today with hyperdynamic LV - Will stop chlorithalidone. Decrease spiro to 25. Add verapamil 180  daily. Will follow to see how she toelrates  3. Scleroderma - Followed by Dr Trudie Reed - see discussion above  4. Pulmonary fibrosis - High res CT chest c/w with ILD  - Followed by Dr Vaughan Browner  5. Tachycardia - check ECG - cut diuretics - recent labs ok no evidence infection or anemia. Will check TFTs today  Glori Bickers, MD 11/18/18

## 2018-11-18 NOTE — Progress Notes (Signed)
  Echocardiogram 2D Echocardiogram has been performed.  Burnett Kanaris 11/18/2018, 12:19 PM

## 2018-11-18 NOTE — Progress Notes (Signed)
Subjective:   Patient ID: Karen [name], female   DOB: 48 y.o.   MRN: [id number]   HPI Patient states doing well with only mild pain and states the left foot continues to improve.  States she is walking better and is satisfied so far   ROS      Objective:  Physical Exam  Neurovascular status intact negative Homans sign noted with pins intact second third fourth digit right wound edges well coapted hallux in rectus position with left foot healing well with incision sites crusted over with no drainage and good alignment noted     Assessment:  Overall doing well post foot surgery right and left foot     Plan:  H&P condition reviewed and recommended continued elevation compression immobilization and reapplied sterile dressing right foot.  Dispensed ankle compression stocking for the left and reviewed gradual increase in activity and we will start physical therapy due to moderate stiffness of the first MPJ left foot  X-ray indicates that there is good alignment of the bones with structure healthy and fixation in place

## 2018-11-19 LAB — T3, FREE: T3, Free: 2.3 pg/mL (ref 2.0–4.4)

## 2018-11-20 ENCOUNTER — Telehealth (HOSPITAL_COMMUNITY): Payer: Self-pay

## 2018-11-20 NOTE — Telephone Encounter (Signed)
Called pt to review lab work. Pt complained of headache with verapamil. Pt had been taking in the morning with morning meds. Asked pt to start taking it at night before bed and to let us know if it continues to cause her issues.

## 2018-11-20 NOTE — Telephone Encounter (Signed)
-----   Message from Jolaine Artist, MD sent at 11/18/2018  5:01 PM EDT ----- Thyroid normal

## 2018-11-25 DIAGNOSIS — M25674 Stiffness of right foot, not elsewhere classified: Secondary | ICD-10-CM | POA: Diagnosis not present

## 2018-11-25 DIAGNOSIS — M79671 Pain in right foot: Secondary | ICD-10-CM | POA: Diagnosis not present

## 2018-11-25 DIAGNOSIS — M79672 Pain in left foot: Secondary | ICD-10-CM | POA: Diagnosis not present

## 2018-11-25 DIAGNOSIS — R269 Unspecified abnormalities of gait and mobility: Secondary | ICD-10-CM | POA: Diagnosis not present

## 2018-11-27 DIAGNOSIS — M79672 Pain in left foot: Secondary | ICD-10-CM | POA: Diagnosis not present

## 2018-11-27 DIAGNOSIS — M79671 Pain in right foot: Secondary | ICD-10-CM | POA: Diagnosis not present

## 2018-11-27 DIAGNOSIS — R269 Unspecified abnormalities of gait and mobility: Secondary | ICD-10-CM | POA: Diagnosis not present

## 2018-11-27 DIAGNOSIS — M25674 Stiffness of right foot, not elsewhere classified: Secondary | ICD-10-CM | POA: Diagnosis not present

## 2018-11-27 DIAGNOSIS — M25675 Stiffness of left foot, not elsewhere classified: Secondary | ICD-10-CM | POA: Diagnosis not present

## 2018-11-28 DIAGNOSIS — J849 Interstitial pulmonary disease, unspecified: Secondary | ICD-10-CM | POA: Diagnosis not present

## 2018-11-28 DIAGNOSIS — M349 Systemic sclerosis, unspecified: Secondary | ICD-10-CM | POA: Diagnosis not present

## 2018-11-28 DIAGNOSIS — R5383 Other fatigue: Secondary | ICD-10-CM | POA: Diagnosis not present

## 2018-11-28 DIAGNOSIS — I7300 Raynaud's syndrome without gangrene: Secondary | ICD-10-CM | POA: Diagnosis not present

## 2018-11-28 DIAGNOSIS — M791 Myalgia, unspecified site: Secondary | ICD-10-CM | POA: Diagnosis not present

## 2018-12-02 ENCOUNTER — Ambulatory Visit (INDEPENDENT_AMBULATORY_CARE_PROVIDER_SITE_OTHER): Payer: 59 | Admitting: Podiatry

## 2018-12-02 ENCOUNTER — Other Ambulatory Visit: Payer: Self-pay

## 2018-12-02 ENCOUNTER — Ambulatory Visit (INDEPENDENT_AMBULATORY_CARE_PROVIDER_SITE_OTHER): Payer: 59

## 2018-12-02 DIAGNOSIS — R269 Unspecified abnormalities of gait and mobility: Secondary | ICD-10-CM | POA: Diagnosis not present

## 2018-12-02 DIAGNOSIS — Z09 Encounter for follow-up examination after completed treatment for conditions other than malignant neoplasm: Secondary | ICD-10-CM

## 2018-12-02 DIAGNOSIS — M2041 Other hammer toe(s) (acquired), right foot: Secondary | ICD-10-CM

## 2018-12-02 DIAGNOSIS — M2042 Other hammer toe(s) (acquired), left foot: Secondary | ICD-10-CM

## 2018-12-02 DIAGNOSIS — M21619 Bunion of unspecified foot: Secondary | ICD-10-CM

## 2018-12-02 DIAGNOSIS — M79671 Pain in right foot: Secondary | ICD-10-CM | POA: Diagnosis not present

## 2018-12-02 DIAGNOSIS — M79672 Pain in left foot: Secondary | ICD-10-CM | POA: Diagnosis not present

## 2018-12-02 DIAGNOSIS — M25675 Stiffness of left foot, not elsewhere classified: Secondary | ICD-10-CM | POA: Diagnosis not present

## 2018-12-02 DIAGNOSIS — M25674 Stiffness of right foot, not elsewhere classified: Secondary | ICD-10-CM | POA: Diagnosis not present

## 2018-12-03 NOTE — Progress Notes (Signed)
Subjective:   Patient ID: Karen [name], female   DOB: 48 y.o.   MRN: [id number]   HPI Patient states doing really well with surgery with minimal discomfort and states the left one continues to improve   ROS      Objective:  Physical Exam  Neurovascular status intact negative Homans sign noted with well-healing surgical sites right secondary to surgery with pins in place and good alignment of the left with good range of motion and incision site healing well     Assessment:  Doing well post forefoot reconstruction right and forefoot reconstruction left     Plan:  H&P discussed both conditions and for the right I did have stitches removed wound edges well coapted reapplied sterile dressing and for the left continue range of motion exercises and increased activity  X-ray indicates osteotomies healing well digits in good alignment fixation in place

## 2018-12-09 DIAGNOSIS — M79671 Pain in right foot: Secondary | ICD-10-CM | POA: Diagnosis not present

## 2018-12-09 DIAGNOSIS — M25674 Stiffness of right foot, not elsewhere classified: Secondary | ICD-10-CM | POA: Diagnosis not present

## 2018-12-09 DIAGNOSIS — R269 Unspecified abnormalities of gait and mobility: Secondary | ICD-10-CM | POA: Diagnosis not present

## 2018-12-09 DIAGNOSIS — M25675 Stiffness of left foot, not elsewhere classified: Secondary | ICD-10-CM | POA: Diagnosis not present

## 2018-12-09 DIAGNOSIS — M79672 Pain in left foot: Secondary | ICD-10-CM | POA: Diagnosis not present

## 2018-12-12 DIAGNOSIS — M79671 Pain in right foot: Secondary | ICD-10-CM | POA: Diagnosis not present

## 2018-12-12 DIAGNOSIS — M79672 Pain in left foot: Secondary | ICD-10-CM | POA: Diagnosis not present

## 2018-12-12 DIAGNOSIS — M25674 Stiffness of right foot, not elsewhere classified: Secondary | ICD-10-CM | POA: Diagnosis not present

## 2018-12-12 DIAGNOSIS — R269 Unspecified abnormalities of gait and mobility: Secondary | ICD-10-CM | POA: Diagnosis not present

## 2018-12-12 DIAGNOSIS — M25675 Stiffness of left foot, not elsewhere classified: Secondary | ICD-10-CM | POA: Diagnosis not present

## 2018-12-16 ENCOUNTER — Ambulatory Visit (INDEPENDENT_AMBULATORY_CARE_PROVIDER_SITE_OTHER): Payer: 59 | Admitting: Podiatry

## 2018-12-16 ENCOUNTER — Other Ambulatory Visit: Payer: Self-pay

## 2018-12-16 ENCOUNTER — Ambulatory Visit (INDEPENDENT_AMBULATORY_CARE_PROVIDER_SITE_OTHER): Payer: 59

## 2018-12-16 DIAGNOSIS — M2041 Other hammer toe(s) (acquired), right foot: Secondary | ICD-10-CM

## 2018-12-16 DIAGNOSIS — M21619 Bunion of unspecified foot: Secondary | ICD-10-CM | POA: Diagnosis not present

## 2018-12-16 DIAGNOSIS — M2042 Other hammer toe(s) (acquired), left foot: Secondary | ICD-10-CM

## 2018-12-16 DIAGNOSIS — Z09 Encounter for follow-up examination after completed treatment for conditions other than malignant neoplasm: Secondary | ICD-10-CM | POA: Diagnosis not present

## 2018-12-16 DIAGNOSIS — M79671 Pain in right foot: Secondary | ICD-10-CM | POA: Diagnosis not present

## 2018-12-16 DIAGNOSIS — M779 Enthesopathy, unspecified: Secondary | ICD-10-CM

## 2018-12-16 DIAGNOSIS — R269 Unspecified abnormalities of gait and mobility: Secondary | ICD-10-CM | POA: Diagnosis not present

## 2018-12-16 DIAGNOSIS — M79672 Pain in left foot: Secondary | ICD-10-CM | POA: Diagnosis not present

## 2018-12-16 DIAGNOSIS — M25674 Stiffness of right foot, not elsewhere classified: Secondary | ICD-10-CM | POA: Diagnosis not present

## 2018-12-16 DIAGNOSIS — M25675 Stiffness of left foot, not elsewhere classified: Secondary | ICD-10-CM | POA: Diagnosis not present

## 2018-12-17 NOTE — Progress Notes (Signed)
Subjective:   Patient ID: Karen [name], female   DOB: 48 y.o.   MRN: [id number]   HPI Patient states her feet are doing really well and she is ready to get the pins out of the right foot   ROS      Objective:  Physical Exam  Neurovascular status intact negative Homans sign noted with both feet clinically in excellent alignment wound edges well coapted digits in good position pins in place right     Assessment:  Overall doing very well post surgery of both feet     Plan:  H&P both feet discussed x-rays reviewed and today pins removed right second third fourth toe sterile dressings applied and instructed on continued physical therapy range of motion exercises and gradual return to soft shoe gear over the next 3 weeks.  Reappoint 4 weeks to recheck  X-rays indicate osteotomies healing well fixation of digits in good position with no indications of movement with movement around the fifth metatarsal right but it should heal with secondary healing around this gradually over the next month or 2 and I gave instructions for continued immobilization right until this completely heals signed visit

## 2018-12-18 DIAGNOSIS — M25674 Stiffness of right foot, not elsewhere classified: Secondary | ICD-10-CM | POA: Diagnosis not present

## 2018-12-18 DIAGNOSIS — M25675 Stiffness of left foot, not elsewhere classified: Secondary | ICD-10-CM | POA: Diagnosis not present

## 2018-12-18 DIAGNOSIS — M79671 Pain in right foot: Secondary | ICD-10-CM | POA: Diagnosis not present

## 2018-12-18 DIAGNOSIS — M79672 Pain in left foot: Secondary | ICD-10-CM | POA: Diagnosis not present

## 2018-12-18 DIAGNOSIS — R269 Unspecified abnormalities of gait and mobility: Secondary | ICD-10-CM | POA: Diagnosis not present

## 2018-12-23 DIAGNOSIS — M79671 Pain in right foot: Secondary | ICD-10-CM | POA: Diagnosis not present

## 2018-12-23 DIAGNOSIS — M25674 Stiffness of right foot, not elsewhere classified: Secondary | ICD-10-CM | POA: Diagnosis not present

## 2018-12-23 DIAGNOSIS — M79672 Pain in left foot: Secondary | ICD-10-CM | POA: Diagnosis not present

## 2018-12-23 DIAGNOSIS — M25675 Stiffness of left foot, not elsewhere classified: Secondary | ICD-10-CM | POA: Diagnosis not present

## 2018-12-23 DIAGNOSIS — R269 Unspecified abnormalities of gait and mobility: Secondary | ICD-10-CM | POA: Diagnosis not present

## 2018-12-25 DIAGNOSIS — R269 Unspecified abnormalities of gait and mobility: Secondary | ICD-10-CM | POA: Diagnosis not present

## 2018-12-25 DIAGNOSIS — M79671 Pain in right foot: Secondary | ICD-10-CM | POA: Diagnosis not present

## 2018-12-25 DIAGNOSIS — M25674 Stiffness of right foot, not elsewhere classified: Secondary | ICD-10-CM | POA: Diagnosis not present

## 2018-12-25 DIAGNOSIS — M25675 Stiffness of left foot, not elsewhere classified: Secondary | ICD-10-CM | POA: Diagnosis not present

## 2018-12-25 DIAGNOSIS — M79672 Pain in left foot: Secondary | ICD-10-CM | POA: Diagnosis not present

## 2018-12-27 ENCOUNTER — Telehealth: Payer: Self-pay | Admitting: Podiatry

## 2018-12-27 NOTE — Telephone Encounter (Signed)
Pt is having pain in right foot, which has a stress fracture. Pt is scheduled to go back to work on 07 September and her job wants to know if she will be able to return to work then. Pt is unsure due to how she feels she is progressing in physical therapy.

## 2018-12-30 ENCOUNTER — Encounter (HOSPITAL_COMMUNITY): Payer: Self-pay

## 2018-12-30 ENCOUNTER — Encounter (HOSPITAL_COMMUNITY)
Admission: RE | Admit: 2018-12-30 | Discharge: 2018-12-30 | Disposition: A | Discharge status: Home / Self Care | Payer: 59 | Source: Ambulatory Visit | Attending: Internal Medicine | Admitting: Internal Medicine

## 2018-12-30 ENCOUNTER — Other Ambulatory Visit: Payer: Self-pay

## 2018-12-30 HISTORY — DX: Gastro-esophageal reflux disease without esophagitis: K21.9

## 2018-12-30 HISTORY — DX: Hypothyroidism, unspecified: E03.9

## 2018-12-30 NOTE — Telephone Encounter (Signed)
Left message informing pt she should make an appt prior to 01/13/2019 to be evaluated for return to work status and let her employer know.

## 2018-12-31 ENCOUNTER — Other Ambulatory Visit (HOSPITAL_COMMUNITY)
Admission: RE | Admit: 2018-12-31 | Discharge: 2018-12-31 | Disposition: A | Discharge status: Home / Self Care | Payer: 59 | Source: Ambulatory Visit | Attending: Internal Medicine | Admitting: Internal Medicine

## 2018-12-31 DIAGNOSIS — Z01812 Encounter for preprocedural laboratory examination: Secondary | ICD-10-CM | POA: Insufficient documentation

## 2018-12-31 DIAGNOSIS — Z20828 Contact with and (suspected) exposure to other viral communicable diseases: Secondary | ICD-10-CM | POA: Insufficient documentation

## 2018-12-31 LAB — SARS CORONAVIRUS 2 (TAT 6-24 HRS): SARS Coronavirus 2: NEGATIVE

## 2018-12-31 MED FILL — NP THYROID 120 MG TABLET: 120 | 30 days supply | Qty: 30 | Fill #0

## 2018-12-31 MED FILL — METOCLOPRAMIDE 5 MG TABLET: 5 | 30 days supply | Qty: 60 | Fill #0

## 2019-01-01 DIAGNOSIS — M25674 Stiffness of right foot, not elsewhere classified: Secondary | ICD-10-CM | POA: Diagnosis not present

## 2019-01-01 DIAGNOSIS — M79671 Pain in right foot: Secondary | ICD-10-CM | POA: Diagnosis not present

## 2019-01-01 DIAGNOSIS — M79672 Pain in left foot: Secondary | ICD-10-CM | POA: Diagnosis not present

## 2019-01-01 DIAGNOSIS — R269 Unspecified abnormalities of gait and mobility: Secondary | ICD-10-CM | POA: Diagnosis not present

## 2019-01-01 DIAGNOSIS — M25675 Stiffness of left foot, not elsewhere classified: Secondary | ICD-10-CM | POA: Diagnosis not present

## 2019-01-01 NOTE — Progress Notes (Signed)
Pt informed to be here at 1030 on 01/02/2019 for procedure. Voiced understanding.

## 2019-01-02 ENCOUNTER — Encounter (HOSPITAL_COMMUNITY): Payer: Self-pay | Admitting: *Deleted

## 2019-01-02 ENCOUNTER — Encounter (HOSPITAL_COMMUNITY)
Admission: RE | Disposition: A | Discharge status: Home / Self Care | Payer: Self-pay | Source: Home / Self Care | Attending: Internal Medicine

## 2019-01-02 ENCOUNTER — Other Ambulatory Visit: Payer: Self-pay

## 2019-01-02 ENCOUNTER — Ambulatory Visit (HOSPITAL_COMMUNITY): Payer: 59 | Admitting: Anesthesiology

## 2019-01-02 ENCOUNTER — Ambulatory Visit (HOSPITAL_COMMUNITY)
Admission: RE | Admit: 2019-01-02 | Discharge: 2019-01-02 | Disposition: A | Discharge status: Home / Self Care | Payer: 59 | Attending: Internal Medicine | Admitting: Internal Medicine

## 2019-01-02 DIAGNOSIS — M349 Systemic sclerosis, unspecified: Secondary | ICD-10-CM | POA: Insufficient documentation

## 2019-01-02 DIAGNOSIS — Z1211 Encounter for screening for malignant neoplasm of colon: Secondary | ICD-10-CM | POA: Insufficient documentation

## 2019-01-02 DIAGNOSIS — Z79899 Other long term (current) drug therapy: Secondary | ICD-10-CM | POA: Insufficient documentation

## 2019-01-02 DIAGNOSIS — E039 Hypothyroidism, unspecified: Secondary | ICD-10-CM | POA: Insufficient documentation

## 2019-01-02 DIAGNOSIS — I10 Essential (primary) hypertension: Secondary | ICD-10-CM | POA: Diagnosis not present

## 2019-01-02 DIAGNOSIS — Z79890 Hormone replacement therapy (postmenopausal): Secondary | ICD-10-CM | POA: Insufficient documentation

## 2019-01-02 DIAGNOSIS — K219 Gastro-esophageal reflux disease without esophagitis: Secondary | ICD-10-CM | POA: Diagnosis not present

## 2019-01-02 HISTORY — PX: COLONOSCOPY WITH PROPOFOL: SHX5780

## 2019-01-02 SURGERY — COLONOSCOPY WITH PROPOFOL
Anesthesia: General

## 2019-01-02 MED ORDER — MIDAZOLAM HCL 2 MG/2ML IJ SOLN
INTRAMUSCULAR | Status: AC
  Filled 2019-01-02: qty 2

## 2019-01-02 MED ORDER — PROPOFOL 10 MG/ML IV BOLUS
INTRAVENOUS | Status: AC
  Filled 2019-01-02: qty 40

## 2019-01-02 MED ORDER — LACTATED RINGERS IV SOLN
INTRAVENOUS | Status: DC | PRN
  Administered 2019-01-02: 11:00:00 via INTRAVENOUS

## 2019-01-02 MED ORDER — PROPOFOL 500 MG/50ML IV EMUL
INTRAVENOUS | Status: DC | PRN
  Administered 2019-01-02: 125 ug/kg/min via INTRAVENOUS

## 2019-01-02 MED ORDER — CHLORHEXIDINE GLUCONATE CLOTH 2 % EX PADS: 6.0000 | MEDICATED_PAD | Freq: Once | CUTANEOUS | Status: DC

## 2019-01-02 MED ORDER — LACTATED RINGERS IV SOLN
Freq: Once | INTRAVENOUS | Status: AC
  Administered 2019-01-02: 11:00:00 via INTRAVENOUS

## 2019-01-02 NOTE — Op Note (Signed)
[facility] Patient Name: Karen [name] Procedure Date: 01/02/2019 12:52 PM MRN: [id number] Date of Birth: [birth year] Attending MD: Norvel Richards , MD CSN: [id number] Age: 48 Admit Type: Outpatient Procedure:                Colonoscopy Indications:              Screening for colorectal malignant neoplasm Providers:                Norvel Richards, MD, Hinton Rao, RN, Nelma Rothman, Technician Referring MD:              Medicines:                Propofol per Anesthesia Complications:            No immediate complications. Estimated Blood Loss:     Estimated blood loss: none. Procedure:                After obtaining informed consent, the colonoscope                            was passed under direct vision. Throughout the                            procedure, the patient's blood pressure, pulse, and                            oxygen saturations were monitored continuously. The                            [id number] scope was introduced through                            the anus and advanced to the the cecum, identified                            by appendiceal orifice and ileocecal valve. The                            colonoscopy was performed without difficulty. The                            patient tolerated the procedure well. The quality                            of the bowel preparation was adequate. The                            ileocecal valve, appendiceal orifice, and rectum                            were photographed. The entire colon was well  visualized. Scope In: 1:09:31 PM Scope Out: 1:22:48 PM Scope Withdrawal Time: 0 hours 6 minutes 54 seconds  Total Procedure Duration: 0 hours 13 minutes 17 seconds  Findings:      The colon (entire examined portion) appeared normal.      The retroflexed view of the distal rectum and anal verge was normal and       showed no anal or rectal  abnormalities. Impression:               - The entire examined colon is normal.                           - No specimens collected. Moderate Sedation:      Moderate (conscious) sedation was personally administered by an       anesthesia professional. The following parameters were monitored: oxygen       saturation, heart rate, blood pressure, respiratory rate, EKG, adequacy       of pulmonary ventilation, and response to care. Recommendation:           - Patient has a contact number available for                            emergencies. The signs and symptoms of potential                            delayed complications were discussed with the                            patient. Return to normal activities tomorrow.                            Written discharge instructions were provided to the                            patient.                           - Continue present medications.                           - Repeat colonoscopy in 10 years for screening                            purposes. Office visit with [place] in 3 months Procedure Code(s):        --- Professional ---                           [phone], Colonoscopy, flexible; diagnostic, including                            collection of specimen(s) by brushing or washing,                            when performed (separate procedure) Diagnosis Code(s):        --- Professional ---  Z12.11, Encounter for screening for malignant                            neoplasm of colon CPT copyright 2019 American Medical Association. All rights reserved. The codes documented in this report are preliminary and upon coder review may  be revised to meet current compliance requirements. Cristopher Estimable. Jarry Manon, MD Norvel Richards, MD 01/02/2019 1:34:41 PM This report has been signed electronically. Number of Addenda: 0

## 2019-01-02 NOTE — Discharge Instructions (Signed)
°  Colonoscopy Discharge Instructions  Read the instructions outlined below and refer to this sheet in the next few weeks. These discharge instructions provide you with general information on caring for yourself after you leave the hospital. Your doctor may also give you specific instructions. While your treatment has been planned according to the most current medical practices available, unavoidable complications occasionally occur. If you have any problems or questions after discharge, call Dr. [name] at [phone]. ACTIVITY  You may resume your regular activity, but move at a slower pace for the next 24 hours.   Take frequent rest periods for the next 24 hours.   Walking will help get rid of the air and reduce the bloated feeling in your belly (abdomen).   No driving for 24 hours (because of the medicine (anesthesia) used during the test).    Do not sign any important legal documents or operate any machinery for 24 hours (because of the anesthesia used during the test).  NUTRITION  Drink plenty of fluids.   You may resume your normal diet as instructed by your doctor.   Begin with a light meal and progress to your normal diet. Heavy or fried foods are harder to digest and may make you feel sick to your stomach (nauseated).   Avoid alcoholic beverages for 24 hours or as instructed.  MEDICATIONS  You may resume your normal medications unless your doctor tells you otherwise.  WHAT YOU CAN EXPECT TODAY  Some feelings of bloating in the abdomen.   Passage of more gas than usual.   Spotting of blood in your stool or on the toilet paper.  IF YOU HAD POLYPS REMOVED DURING THE COLONOSCOPY:  No aspirin products for 7 days or as instructed.   No alcohol for 7 days or as instructed.   Eat a soft diet for the next 24 hours.  FINDING OUT THE RESULTS OF YOUR TEST Not all test results are available during your visit. If your test results are not back during the visit, make an appointment  with your caregiver to find out the results. Do not assume everything is normal if you have not heard from your caregiver or the medical facility. It is important for you to follow up on all of your test results.  SEEK IMMEDIATE MEDICAL ATTENTION IF:  You have more than a spotting of blood in your stool.   Your belly is swollen (abdominal distention).   You are nauseated or vomiting.   You have a temperature over 101.   You have abdominal pain or discomfort that is severe or gets worse throughout the day.   Repeat colonoscopy in 10 years for screening purposes  Office visit with [place] in 3 months  I discussed my findings and recommendations with Karen [name] at [phone]

## 2019-01-02 NOTE — Transfer of Care (Signed)
Immediate Anesthesia Transfer of Care Note  Patient: Karen [name]  Procedure(s) Performed: COLONOSCOPY WITH PROPOFOL (N/A )  Patient Location: PACU  Anesthesia Type:General  Level of Consciousness: drowsy and patient cooperative  Airway & Oxygen Therapy: Patient Spontanous Breathing and Patient connected to nasal cannula oxygen  Post-op Assessment: Report given to RN  Post vital signs: Reviewed and stable  Last Vitals:  Vitals Value Taken Time  BP    Temp    Pulse    Resp    SpO2      Last Pain:  Vitals:   01/02/19 1028  TempSrc: Oral  PainSc: 0-No pain      Patients Stated Pain Goal: 5 ([id number])  Complications: No apparent anesthesia complications

## 2019-01-02 NOTE — Anesthesia Preprocedure Evaluation (Signed)
Anesthesia Evaluation  Patient identified by MRN, date of birth, ID band Patient awake    Reviewed: Allergy & Precautions, NPO status , Patient's Chart, lab work & pertinent test results  Airway Mallampati: II  TM Distance: >3 FB Neck ROM: Full    Dental   Pulmonary shortness of breath and with exertion,  scleroderma with interstitial disease   Pulmonary exam normal breath sounds clear to auscultation       Cardiovascular METS: 3 - Mets hypertension, Pt. on medications Normal cardiovascular exam     Neuro/Psych negative psych ROS   GI/Hepatic GERD  Medicated and Controlled,  Endo/Other  Hypothyroidism   Renal/GU      Musculoskeletal   Abdominal   Peds  Hematology   Anesthesia Other Findings scleroderma with interstitial disease  Reproductive/Obstetrics                            Anesthesia Physical Anesthesia Plan  ASA: III  Anesthesia Plan: General   Post-op Pain Management:    Induction:   PONV Risk Score and Plan:   Airway Management Planned: Natural Airway, Nasal Cannula and Simple Face Mask  Additional Equipment:   Intra-op Plan:   Post-operative Plan:   Informed Consent: I have reviewed the patients History and Physical, chart, labs and discussed the procedure including the risks, benefits and alternatives for the proposed anesthesia with the patient or authorized representative who has indicated his/her understanding and acceptance.     Dental advisory given  Plan Discussed with: CRNA  Anesthesia Plan Comments:        Anesthesia Quick Evaluation

## 2019-01-02 NOTE — Anesthesia Postprocedure Evaluation (Signed)
Anesthesia Post Note  Patient: Karen [name]  Procedure(s) Performed: COLONOSCOPY WITH PROPOFOL (N/A )  Patient location during evaluation: PACU Anesthesia Type: General Level of consciousness: awake and patient cooperative Pain management: pain level controlled Vital Signs Assessment: post-procedure vital signs reviewed and stable Respiratory status: spontaneous breathing, respiratory function stable and nonlabored ventilation Cardiovascular status: blood pressure returned to baseline Postop Assessment: no apparent nausea or vomiting Anesthetic complications: no     Last Vitals:  Vitals:   01/02/19 1028 01/02/19 1330  BP: 112/82   Pulse: 98   Resp: (!) 22   Temp: 36.8 C 36.4 C  SpO2: 100% 96%    Last Pain:  Vitals:   01/02/19 1330  TempSrc:   PainSc: Asleep                 Zayvion Stailey J

## 2019-01-02 NOTE — H&P (Signed)
@LOGO @   Primary Care Physician:  [name], MD Primary Gastroenterologist:  Dr. [name]  Pre-Procedure History & Physical: HPI:  Karen [name] is a 48 y.o. female here for first ever screening colonoscopy.  Inflammatory changes in the rectosigmoid area treated as diverticulitis early in the year.  Abdominal pain associated with that episode has resolved.   Past Medical History:  Diagnosis Date  . GERD (gastroesophageal reflux disease)   . Hypertension   . Hypothyroidism   . ILD (interstitial lung disease) ([place]) 02/08/2018  . Interstitial lung disease ([place])   . Multinodular thyroid    benign FNA 08/2017.  Marland Kitchen Scleroderma [facility])     Past Surgical History:  Procedure Laterality Date  . ABDOMINAL HERNIA REPAIR    . BUNIONECTOMY Bilateral 10/2018  . ESOPHAGOGASTRODUODENOSCOPY (EGD) WITH PROPOFOL N/A 01/28/2018   erosive reflux esophagitis, patulous EG Junction, no dilation, incomplete EGD due to retained food in stomach. GES thereafter with delayed gastric emptying.   Marland Kitchen RIGHT HEART CATH N/A 09/27/2017   Procedure: RIGHT HEART CATH;  Surgeon: Jolaine Artist, MD;  Location: [place] [facility];  Service: Cardiovascular;  Laterality: N/A;  . UTERINE FIBROID SURGERY      Prior to Admission medications   Medication Sig Start Date End Date Taking? Authorizing Provider  Cholecalciferol (VITAMIN D-3) 5000 units TABS Take 5,000 Units by mouth daily.    Yes [provider]  esomeprazole (NEXIUM) 40 MG capsule Take 40 mg by mouth 2 (two) times daily before a meal.  01/28/18  Yes [provider]  metoCLOPramide (REGLAN) 5 MG tablet Take 1 tablet (5 mg total) by mouth 2 (two) times daily before a meal. 07/03/18  Yes Annitta Needs, NP  Multiple Vitamins-Minerals (MEGA MULTIVITAMIN) POWD Take 1 Scoop by mouth daily. Mix 1 scoop in water   Yes [provider]  mycophenolate (CELLCEPT) 500 MG tablet Tale 3 tablets in the AM (1500mg  total) and 3 tablets in the PM  (1500mg ) Patient taking differently: Take 1,500 mg by mouth 2 (two) times daily.  02/04/18  Yes Mannam, Praveen, MD  spironolactone (ALDACTONE) 25 MG tablet Take 1 tablet (25 mg total) by mouth daily. 11/18/18  Yes Bensimhon, Shaune Pascal, MD  sulfamethoxazole-trimethoprim (BACTRIM DS) 800-160 MG tablet 1 tablet daily Patient taking differently: Take 1 tablet by mouth daily.  10/23/17  Yes Mannam, Praveen, MD  thyroid (NP THYROID) 120 MG tablet Take 120 mg by mouth daily.   Yes [provider]  verapamil (CALAN-SR) 180 MG CR tablet Take 1 tablet (180 mg total) by mouth at bedtime. 11/18/18  Yes Bensimhon, Shaune Pascal, MD    Allergies as of 10/24/2018 - Review Complete 10/16/2018  Allergen Reaction Noted  . Lisinopril Hives and Swelling 08/13/2015    Family History  Problem Relation Age of Onset  . Hypertension Father   . Hypertension Sister   . Hypertension Brother   . Diabetes Maternal Grandfather   . Diabetes Maternal Uncle   . Diabetes Mother   . Colon cancer Neg Hx     Social History   Socioeconomic History  . Marital status: Single    Spouse name: Not on file  . Number of children: 0  . Years of education: Not on file  . Highest education level: Associate degree: occupational, Hotel manager, or vocational program  Occupational History  . Occupation: CMA    Employer: [employer]  Social Needs  . Financial resource strain: Not on file  . Food insecurity  Worry: Not on file    Inability: Not on file  . Transportation needs    Medical: Not on file    Non-medical: Not on file  Tobacco Use  . Smoking status: Never Smoker  . Smokeless tobacco: Never Used  Substance and Sexual Activity  . Alcohol use: No    Frequency: Never  . Drug use: No  . Sexual activity: Yes  Lifestyle  . Physical activity    Days per week: Not on file    Minutes per session: Not on file  . Stress: Not on file  Relationships  . Social Herbalist on phone: Not on file     Gets together: Not on file    Attends religious service: Not on file    Active member of club or organization: Not on file    Attends meetings of clubs or organizations: Not on file    Relationship status: Not on file  . Intimate partner violence    Fear of current or ex partner: Not on file    Emotionally abused: Not on file    Physically abused: Not on file    Forced sexual activity: Not on file  Other Topics Concern  . Not on file  Social History Narrative   Patient is right-handed. She lives alone in one level home, a few steps to enter.    Review of Systems: See HPI, otherwise negative ROS  Physical Exam: BP 112/82   Pulse 98   Temp 98.2 F (36.8 C) (Oral)   Resp (!) 22   Ht 5\' 5"  (1.651 m)   Wt 56.7 kg   LMP 12/25/2018   SpO2 100%   BMI 20.80 kg/m  General:   Alert,  Well-developed, well-nourished, pleasant and cooperative in NAD Neck:  Supple; no masses or thyromegaly. No significant cervical adenopathy. Lungs:  Clear throughout to auscultation.   No wheezes, crackles, or rhonchi. No acute distress. Heart:  Regular rate and rhythm; no murmurs, clicks, rubs,  or gallops. Abdomen: Non-distended, normal bowel sounds.  Soft and nontender without appreciable mass or hepatosplenomegaly.  Pulses:  Normal pulses noted. Extremities:  Without clubbing or edema.  Impression/Plan: 48 year old lady presents for first ever screening colonoscopy.  The risks, benefits, limitations, alternatives and imponderables have been reviewed with the patient. Questions have been answered. All parties are agreeable.      Notice: This dictation was prepared with Dragon dictation along with smaller phrase technology. Any transcriptional errors that result from this process are unintentional and may not be corrected upon review.

## 2019-01-03 DIAGNOSIS — M25675 Stiffness of left foot, not elsewhere classified: Secondary | ICD-10-CM | POA: Diagnosis not present

## 2019-01-03 DIAGNOSIS — M79672 Pain in left foot: Secondary | ICD-10-CM | POA: Diagnosis not present

## 2019-01-03 DIAGNOSIS — R269 Unspecified abnormalities of gait and mobility: Secondary | ICD-10-CM | POA: Diagnosis not present

## 2019-01-03 DIAGNOSIS — M25674 Stiffness of right foot, not elsewhere classified: Secondary | ICD-10-CM | POA: Diagnosis not present

## 2019-01-03 DIAGNOSIS — M79671 Pain in right foot: Secondary | ICD-10-CM | POA: Diagnosis not present

## 2019-01-06 ENCOUNTER — Encounter (HOSPITAL_COMMUNITY): Payer: Self-pay | Admitting: Internal Medicine

## 2019-01-06 ENCOUNTER — Other Ambulatory Visit: Payer: Self-pay

## 2019-01-06 ENCOUNTER — Ambulatory Visit (HOSPITAL_COMMUNITY)
Admission: RE | Admit: 2019-01-06 | Discharge: 2019-01-06 | Disposition: A | Discharge status: Home / Self Care | Payer: 59 | Source: Ambulatory Visit | Attending: Pulmonary Disease | Admitting: Pulmonary Disease

## 2019-01-06 DIAGNOSIS — J849 Interstitial pulmonary disease, unspecified: Secondary | ICD-10-CM | POA: Insufficient documentation

## 2019-01-06 LAB — PULMONARY FUNCTION TEST
DL/VA % pred: 91 %
DL/VA: 3.93 ml/min/mmHg/L
DLCO unc % pred: 42 %
DLCO unc: 9.16 ml/min/mmHg
FEF 25-75 Post: 2.92 L/sec
FEF 25-75 Pre: 2.51 L/sec
FEF2575-%Change-Post: 16 %
FEF2575-%Pred-Post: 115 %
FEF2575-%Pred-Pre: 99 %
FEV1-%Change-Post: 0 %
FEV1-%Pred-Post: 60 %
FEV1-%Pred-Pre: 60 %
FEV1-Post: 1.46 L
FEV1-Pre: 1.46 L
FEV1FVC-%Change-Post: 2 %
FEV1FVC-%Pred-Pre: 111 %
FEV6-%Change-Post: -1 %
FEV6-%Pred-Post: 54 %
FEV6-%Pred-Pre: 54 %
FEV6-Post: 1.58 L
FEV6-Pre: 1.61 L
FEV6FVC-%Change-Post: 0 %
FEV6FVC-%Pred-Post: 102 %
FEV6FVC-%Pred-Pre: 102 %
FVC-%Change-Post: -1 %
FVC-%Pred-Post: 52 %
FVC-%Pred-Pre: 53 %
FVC-Post: 1.58 L
FVC-Pre: 1.61 L
Post FEV1/FVC ratio: 92 %
Post FEV6/FVC ratio: 100 %
Pre FEV1/FVC ratio: 91 %
Pre FEV6/FVC Ratio: 100 %
RV % pred: 51 %
RV: 0.95 L
TLC % pred: 50 %
TLC: 2.62 L

## 2019-01-06 MED ORDER — ALBUTEROL SULFATE (2.5 MG/3ML) 0.083% IN NEBU
2.5000 mg | INHALATION_SOLUTION | Freq: Once | RESPIRATORY_TRACT | Status: AC
  Administered 2019-01-06: 2.5 mg via RESPIRATORY_TRACT

## 2019-01-09 ENCOUNTER — Ambulatory Visit (INDEPENDENT_AMBULATORY_CARE_PROVIDER_SITE_OTHER): Payer: 59 | Admitting: Podiatry

## 2019-01-09 ENCOUNTER — Other Ambulatory Visit: Payer: Self-pay

## 2019-01-09 ENCOUNTER — Ambulatory Visit (INDEPENDENT_AMBULATORY_CARE_PROVIDER_SITE_OTHER): Payer: 59

## 2019-01-09 ENCOUNTER — Encounter: Payer: Self-pay | Admitting: Podiatry

## 2019-01-09 DIAGNOSIS — Z09 Encounter for follow-up examination after completed treatment for conditions other than malignant neoplasm: Secondary | ICD-10-CM

## 2019-01-09 DIAGNOSIS — M2042 Other hammer toe(s) (acquired), left foot: Secondary | ICD-10-CM | POA: Diagnosis not present

## 2019-01-09 DIAGNOSIS — M2041 Other hammer toe(s) (acquired), right foot: Secondary | ICD-10-CM | POA: Diagnosis not present

## 2019-01-10 DIAGNOSIS — M25674 Stiffness of right foot, not elsewhere classified: Secondary | ICD-10-CM | POA: Diagnosis not present

## 2019-01-10 DIAGNOSIS — M25675 Stiffness of left foot, not elsewhere classified: Secondary | ICD-10-CM | POA: Diagnosis not present

## 2019-01-10 DIAGNOSIS — M79672 Pain in left foot: Secondary | ICD-10-CM | POA: Diagnosis not present

## 2019-01-10 DIAGNOSIS — R269 Unspecified abnormalities of gait and mobility: Secondary | ICD-10-CM | POA: Diagnosis not present

## 2019-01-10 DIAGNOSIS — M79671 Pain in right foot: Secondary | ICD-10-CM | POA: Diagnosis not present

## 2019-01-10 NOTE — Progress Notes (Signed)
Subjective:   Patient ID: Karen [name], female   DOB: 48 y.o.   MRN: [id number]   HPI Patient states overall doing well with my right foot being tender on the outside and the toes in the left foot doing better in that foot was done about 5 weeks before the other 1.  Overall and very pleased but I would like to get back to work full-time if possible   ROS      Objective:  Physical Exam  Neurovascular status intact negative Homans sign noted with clinical excellent position of all digits with good range of motion first MPJ bilateral with mild discomfort around the fifth MPJ right and the lesser digits on the right foot with good alignment of the digits noted and no excessive swelling noted anywhere at the current time     Assessment:  Does have pressure on the right fifth metatarsal with continued healing of the osteotomy with the digits in good alignment and excellent position of the first metatarsal bilateral     Plan:  H&P reviewed x-rays.  At this point I did discuss that there is still healing to go the fifth MPJ right but I do believe it will heal uneventfully at this position even though I want to keep an eye on it as there is stress on the screw and I want her to continue with immobilization to try to keep pressure off of it.  Patient will be seen back to me recheck in 4 weeks and was given strict instructions if there should be any increase in swelling or if it should not continue to progress patient will let me know and will gradually return to work over the next several weeks  X-rays indicate that there is some medial movement of the fifth metatarsal right with fatigue of the screw but I do think it will heal uneventfully at this position with some pop probable secondary intent.  Everything else looks appropriate and excellent alignment is noted of the first MPJ bilateral with good fixation no indication of movement

## 2019-01-14 ENCOUNTER — Other Ambulatory Visit: Payer: 59

## 2019-01-14 ENCOUNTER — Telehealth: Payer: Self-pay | Admitting: Podiatry

## 2019-01-14 ENCOUNTER — Encounter: Payer: Self-pay | Admitting: Podiatry

## 2019-01-14 NOTE — Telephone Encounter (Signed)
fine

## 2019-01-14 NOTE — Telephone Encounter (Signed)
Pt wanted to be able to go back to work on light duty. She stated that her job could accomodate and she needs a note from Dr. Paulla Dolly saying its ok.

## 2019-01-15 DIAGNOSIS — M25675 Stiffness of left foot, not elsewhere classified: Secondary | ICD-10-CM | POA: Diagnosis not present

## 2019-01-15 DIAGNOSIS — M79672 Pain in left foot: Secondary | ICD-10-CM | POA: Diagnosis not present

## 2019-01-15 DIAGNOSIS — M79671 Pain in right foot: Secondary | ICD-10-CM | POA: Diagnosis not present

## 2019-01-15 DIAGNOSIS — R269 Unspecified abnormalities of gait and mobility: Secondary | ICD-10-CM | POA: Diagnosis not present

## 2019-01-15 DIAGNOSIS — M25674 Stiffness of right foot, not elsewhere classified: Secondary | ICD-10-CM | POA: Diagnosis not present

## 2019-01-15 NOTE — Telephone Encounter (Signed)
Pt called states she Dr. Paulla Dolly originally had her out of work until 02/12/2019, pt states she returned to work today at seated duty and will be reevaluated 02/06/2019, and needs [employer] to up date FMLA. Pt states she received note yesterday, so only needs FMLA updated.

## 2019-01-16 ENCOUNTER — Telehealth (HOSPITAL_COMMUNITY): Payer: Self-pay

## 2019-01-16 ENCOUNTER — Encounter: Payer: Self-pay | Admitting: Podiatry

## 2019-01-16 DIAGNOSIS — G44209 Tension-type headache, unspecified, not intractable: Secondary | ICD-10-CM | POA: Diagnosis not present

## 2019-01-16 DIAGNOSIS — R51 Headache: Secondary | ICD-10-CM | POA: Diagnosis not present

## 2019-01-16 DIAGNOSIS — Z888 Allergy status to other drugs, medicaments and biological substances status: Secondary | ICD-10-CM | POA: Diagnosis not present

## 2019-01-16 DIAGNOSIS — I10 Essential (primary) hypertension: Secondary | ICD-10-CM | POA: Diagnosis not present

## 2019-01-16 DIAGNOSIS — Z79899 Other long term (current) drug therapy: Secondary | ICD-10-CM | POA: Diagnosis not present

## 2019-01-16 MED ORDER — BISOPROLOL FUMARATE 5 MG PO TABS: 5.0000 mg | ORAL_TABLET | Freq: Every day | ORAL | 1 refills | Status: DC

## 2019-01-16 NOTE — Telephone Encounter (Signed)
   Discussed and reviewed with Dr Haroldine Laws.   Stop verapamil. She is allergic to lisinopril and has had hives in the past.  -Start 50 mg losartan by mouth daily. Please let her know there is a 10% chance she may have an allergic reaction.   Check BMET 7 days.   Everhett Bozard NP-C  12:05 PM

## 2019-01-16 NOTE — Telephone Encounter (Signed)
Spoke with patient, advised of recommendations for blood pressure control.  She states that she had a bad reaction requiring hospitilazation after taking lisinopril and wishes not to take risk with taking losartan.  Routed to NP  Please advise

## 2019-01-16 NOTE — Addendum Note (Signed)
Addended by: Valeda Malm on: 01/16/2019 01:09 PM   Modules accepted: Orders

## 2019-01-16 NOTE — Telephone Encounter (Signed)
  Discussed with Dr Haroldine Laws.  Please call and ask her to start bisoprolol 5 mg daily.   Amy Clegg NP-C  12:45 PM

## 2019-01-16 NOTE — Telephone Encounter (Signed)
Pt advised of recommendations. Advised to stop verapamil and start bisoprolol 5mg  daily. Verbalized understanding. Advised to alert office if no improvement of blood pressure and headache

## 2019-01-16 NOTE — Telephone Encounter (Signed)
Pt reports that she has been having terrible headaches since starting the new medication Verapamil.  Pt reports that her blood pressure has been up and down.  This morning 167/113 HR 100's.  1 week ago highest BP was Q000111Q systolic, diastolic [id number].  Headaches usally starts in the evening and she wakes up the next morning with headaches and facial swelling.  Pt reports that she went to the emergency room this morning for the headaches and facial swelling. They did a CT scan of her head however it was negative.  They gave her medicine for the headaches and sent her home once her bp improved.  She was told to f/u with her doctor that follows her bp.  Her next office f/u is on 9/24.  Routed to NP.  Please advise

## 2019-01-24 ENCOUNTER — Ambulatory Visit: Payer: 59 | Admitting: Gastroenterology

## 2019-01-30 ENCOUNTER — Encounter (HOSPITAL_COMMUNITY): Payer: Self-pay | Admitting: Internal Medicine

## 2019-01-30 ENCOUNTER — Other Ambulatory Visit (HOSPITAL_COMMUNITY): Payer: Self-pay

## 2019-01-30 ENCOUNTER — Other Ambulatory Visit: Payer: Self-pay

## 2019-01-30 ENCOUNTER — Ambulatory Visit (HOSPITAL_COMMUNITY)
Admission: RE | Admit: 2019-01-30 | Discharge: 2019-01-30 | Disposition: A | Discharge status: Home / Self Care | Payer: 59 | Source: Ambulatory Visit | Attending: Internal Medicine | Admitting: Internal Medicine

## 2019-01-30 ENCOUNTER — Ambulatory Visit (INDEPENDENT_AMBULATORY_CARE_PROVIDER_SITE_OTHER): Payer: 59

## 2019-01-30 ENCOUNTER — Encounter: Payer: Self-pay | Admitting: Pulmonary Disease

## 2019-01-30 ENCOUNTER — Telehealth: Payer: Self-pay | Admitting: Pulmonary Disease

## 2019-01-30 ENCOUNTER — Ambulatory Visit (INDEPENDENT_AMBULATORY_CARE_PROVIDER_SITE_OTHER): Payer: 59 | Admitting: Pulmonary Disease

## 2019-01-30 VITALS — BP 110/70 | HR 74 | Temp 97.7°F | Ht 65.0 in | Wt 132.6 lb

## 2019-01-30 VITALS — BP 152/100 | HR 72 | Wt 133.6 lb

## 2019-01-30 DIAGNOSIS — Z79899 Other long term (current) drug therapy: Secondary | ICD-10-CM | POA: Insufficient documentation

## 2019-01-30 DIAGNOSIS — J8410 Pulmonary fibrosis, unspecified: Secondary | ICD-10-CM | POA: Insufficient documentation

## 2019-01-30 DIAGNOSIS — Z8249 Family history of ischemic heart disease and other diseases of the circulatory system: Secondary | ICD-10-CM | POA: Insufficient documentation

## 2019-01-30 DIAGNOSIS — M349 Systemic sclerosis, unspecified: Secondary | ICD-10-CM | POA: Insufficient documentation

## 2019-01-30 DIAGNOSIS — R0989 Other specified symptoms and signs involving the circulatory and respiratory systems: Secondary | ICD-10-CM | POA: Diagnosis not present

## 2019-01-30 DIAGNOSIS — R0600 Dyspnea, unspecified: Secondary | ICD-10-CM | POA: Diagnosis not present

## 2019-01-30 DIAGNOSIS — R0902 Hypoxemia: Secondary | ICD-10-CM | POA: Diagnosis not present

## 2019-01-30 DIAGNOSIS — K219 Gastro-esophageal reflux disease without esophagitis: Secondary | ICD-10-CM | POA: Insufficient documentation

## 2019-01-30 DIAGNOSIS — M3500 Sicca syndrome, unspecified: Secondary | ICD-10-CM | POA: Insufficient documentation

## 2019-01-30 DIAGNOSIS — J849 Interstitial pulmonary disease, unspecified: Secondary | ICD-10-CM

## 2019-01-30 DIAGNOSIS — I10 Essential (primary) hypertension: Secondary | ICD-10-CM | POA: Insufficient documentation

## 2019-01-30 DIAGNOSIS — E785 Hyperlipidemia, unspecified: Secondary | ICD-10-CM | POA: Insufficient documentation

## 2019-01-30 DIAGNOSIS — R0601 Orthopnea: Secondary | ICD-10-CM | POA: Diagnosis not present

## 2019-01-30 DIAGNOSIS — R0602 Shortness of breath: Secondary | ICD-10-CM | POA: Diagnosis not present

## 2019-01-30 LAB — COMPREHENSIVE METABOLIC PANEL
ALT: 13 U/L (ref 0–35)
AST: 19 U/L (ref 0–37)
Albumin: 4.1 g/dL (ref 3.5–5.2)
Alkaline Phosphatase: 72 U/L (ref 39–117)
BUN: 12 mg/dL (ref 6–23)
CO2: 27 mEq/L (ref 19–32)
Calcium: 10.6 mg/dL — ABNORMAL HIGH (ref 8.4–10.5)
Chloride: 106 mEq/L (ref 96–112)
Creatinine, Ser: 0.61 mg/dL (ref 0.40–1.20)
GFR: 125.25 mL/min (ref >60.00)
Glucose, Bld: 90 mg/dL (ref 70–99)
Potassium: 4 mEq/L (ref 3.5–5.1)
Sodium: 139 mEq/L (ref 135–145)
Total Bilirubin: 0.4 mg/dL (ref 0.2–1.2)
Total Protein: 7.5 g/dL (ref 6.0–8.3)

## 2019-01-30 LAB — CBC WITH DIFFERENTIAL/PLATELET
Basophils Absolute: 0 10*3/uL (ref 0.0–0.1)
Basophils Relative: 0.7 % (ref 0.0–3.0)
Eosinophils Absolute: 0.2 10*3/uL (ref 0.0–0.7)
Eosinophils Relative: 3.3 % (ref 0.0–5.0)
HCT: 35.9 % — ABNORMAL LOW (ref 36.0–46.0)
Hemoglobin: 11.3 g/dL — ABNORMAL LOW (ref 12.0–15.0)
Lymphocytes Relative: 27.6 % (ref 12.0–46.0)
Lymphs Abs: 2 10*3/uL (ref 0.7–4.0)
MCHC: 31.6 g/dL (ref 30.0–36.0)
MCV: 86.4 fl (ref 78.0–100.0)
Monocytes Absolute: 0.8 10*3/uL (ref 0.1–1.0)
Monocytes Relative: 10.8 % (ref 3.0–12.0)
Neutro Abs: 4.1 10*3/uL (ref 1.4–7.7)
Neutrophils Relative %: 57.6 % (ref 43.0–77.0)
Platelets: 240 10*3/uL (ref 150.0–400.0)
RBC: 4.15 Mil/uL (ref 3.87–5.11)
RDW: 13.6 % (ref 11.5–15.5)
WBC: 7.1 10*3/uL (ref 4.0–10.5)

## 2019-01-30 LAB — SEDIMENTATION RATE: Sed Rate: 20 mm/hr (ref 0–22)

## 2019-01-30 LAB — CBC
HCT: 38.6 % (ref 36.0–46.0)
Hemoglobin: 11.7 g/dL — ABNORMAL LOW (ref 12.0–15.0)
MCH: 27.2 pg (ref 26.0–34.0)
MCHC: 30.3 g/dL (ref 30.0–36.0)
MCV: 89.8 fL (ref 80.0–100.0)
Platelets: 270 10*3/uL (ref 150–400)
RBC: 4.3 MIL/uL (ref 3.87–5.11)
RDW: 13.4 % (ref 11.5–15.5)
WBC: 7.3 10*3/uL (ref 4.0–10.5)
nRBC: 0 % (ref 0.0–0.2)

## 2019-01-30 LAB — BRAIN NATRIURETIC PEPTIDE: B Natriuretic Peptide: 199.4 pg/mL — ABNORMAL HIGH (ref 0.0–100.0)

## 2019-01-30 MED ORDER — POTASSIUM CHLORIDE 20 MEQ/15ML (10%) PO SOLN: 20.0000 meq | Freq: Every day | ORAL | 0 refills | Status: DC

## 2019-01-30 MED ORDER — FUROSEMIDE 40 MG PO TABS: 40.0000 mg | ORAL_TABLET | Freq: Every day | ORAL | 5 refills | Status: DC

## 2019-01-30 MED ORDER — POTASSIUM CHLORIDE ER 20 MEQ PO TBCR: 20.0000 meq | EXTENDED_RELEASE_TABLET | Freq: Every day | ORAL | 5 refills | Status: DC

## 2019-01-30 MED FILL — POTASSIUM CL 10% (20 MEQ/15: 20 MEQ/15ML | 30 days supply | Qty: 473 | Fill #0

## 2019-01-30 MED FILL — FUROSEMIDE 40 MG TAB: 40 | 30 days supply | Qty: 30 | Fill #0

## 2019-01-30 NOTE — Telephone Encounter (Signed)
Pt called stating that she was supposed to have liquid k vs the tab that was ordered while in office. Office staff Doyle spoke with Dr Haroldine Laws, rx switched to liquid

## 2019-01-30 NOTE — Addendum Note (Signed)
Addended by: Hildred Alamin I on: 01/30/2019 10:56 AM   Modules accepted: Orders

## 2019-01-30 NOTE — Progress Notes (Signed)
ResClip  Station A Chest size:27.5 Reading:50%

## 2019-01-30 NOTE — Telephone Encounter (Signed)
Called back cardiology, but no pick up as it was after 5:00. The patient was seen in clinic today by Dr. Vaughan Browner in addition to cardiology. The orders placed by Cardiology are CBC, ESR, B Nat Peptide, BMP by Dr. Haroldine Laws.  Will keep message in triage for follow up tomorrow with Cardiology.

## 2019-01-30 NOTE — Patient Instructions (Signed)
Labs done today. We will contact you only if your labs are abnormal.  START Lasix 40mg (1 tab) by mouth once daily  START Potassium 20mg (1 TAB) by mouth once daily  Your physician has requested that you have an X-Ray done. You will be able to get this done today in the radiology department.   Your physician recommends that you schedule a follow-up appointment on Monday as a tele visit. Dr. Haroldine Laws will contact you around 12pm.   At the [facility], you and your health needs are our priority. As part of our continuing mission to provide you with exceptional heart care, we have created designated Provider Care Teams. These Care Teams include your primary Cardiologist (physician) and Advanced Practice Providers (APPs- Physician Assistants and Nurse Practitioners) who all work together to provide you with the care you need, when you need it.   You may see any of the following providers on your designated Care Team at your next follow up: Marland Kitchen Dr Glori Bickers . Dr Loralie Champagne . Darrick Grinder, NP   Please be sure to bring in all your medications bottles to every appointment.

## 2019-01-30 NOTE — Telephone Encounter (Signed)
Attempted to call, no answer.

## 2019-01-30 NOTE — Telephone Encounter (Signed)
Line rings until busy no vmail: Pt saw Dr. Haroldine Laws today:  1. Dyspnea - Much worse today. Having symptoms of volume overload but previous RHC with PCWP of 6 - However we have previously stopped chloritahlidone and weight up and ReDS reading today very high 49%, recheck 50% suggestive of pulmonary edema or pneumonitis - Start lasix 40 daily and kcl 20 daily - CXR today - BMET, CBC, BNP, ESR - RHC 5/19 with minimally elevated PA pressures  - Echo 5/19: EF 60-65% grade I DD RV normal. Mild TR.  - Echo 11/18/18: EF hyperdynamic 65-70% RV normal Grade 1 DD - Can consider repeat RHC as needed. D/w Dr. Vaughan Browner

## 2019-01-30 NOTE — Progress Notes (Addendum)
Advanced Heart Failure Clinic Note   Referring Physician: Dr Posey Rea PCP: [name], MD PCP-Cardiologist: Mertie Moores, MD   HPI: Karen [name] is a 48 y.o. female [place] at [facility] (with [name]) with a history of Sjogren's syndrome, multinodular goiter, HTN and recently diagnosed scleroderma referred by Dr. Gavin Pound for evaluation of pulmonary HTN in the setting of scleroderma.  RHC in 5/19 with minimally elevated pressures and hi-res CT which showed ILD. Follows with Dr. Vaughan Browner . F/u CT in 2/20 showed stable ILD  Says weight has been down in 120s now back up 130s. Feels she has fluid on board. Dyspnea is worse. +orthopnea/PND. Struggles with daily activities. Saw Dr. Vaughan Browner in Pulmonary today with 6MW and PFTs. Both were concerning for worsening lung disease   Echo 11/18/18: EF hyperdynamic 65-70% RV normal   Echo 5/19: EF 60-65% grade I DD RV normal. Mild TR.   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  Aortic atherosclerosis, three-vessel coronary artery disease.  CT high-resolution 06/27/2018-stable interstitial lung disease. I reviewed the images personally.  PFTs  FENO 09/06/2017-unable to complete  07/27/2017 FVC 1.65 (54%), FEV1 1.53 (62%], F/F 93, TLC 50, DLCO 44%, DLCO/VA 131%  10/25/2017 FVC 1.51 [50%], FEV1 1.26 [51%], F/F 83  01/29/2018 FVC 1.69 [5%), FEV1 1.50 [61%], F/F 89, DLCO 10.57 [41%]  07/01/2018 FVC 1.57 [52%), FEV1 1.47 [60%], F/F 93, DLCO 12.54 [59%)  01/06/2019 FVC 1.58 [52%], FEV1 1.46 [60%], F/F 92, TLC 2.62 [50%], DLCO 9.16 [42%] Severe restriction and diffusion impairment.  FVC is stable but DLCO has worsened.  6-minute walk  10/23/2017- 144 m Post walk heart rate, stats 94, 91%   02/04/2018- 249 m Post walk heart rate, sats 101, 99%  06/06/2018-212 m Post walk heart rate, sats 86, 92%  01/30/2019-175mPost walk heart rate, sats 83, 98%   Past Medical History:  Diagnosis Date  . GERD (gastroesophageal reflux disease)   . Hypertension   . Hypothyroidism   . ILD (interstitial lung disease) ([place] 02/08/2018  . Interstitial lung disease ([place]   . Multinodular thyroid    benign FNA 08/2017.  .Marland KitchenScleroderma ([place]     Current Outpatient Medications  Medication Sig Dispense Refill  . bisoprolol (ZEBETA) 5 MG tablet Take 1 tablet (5 mg total) by mouth daily. 30 tablet 1  . Cholecalciferol (VITAMIN D-3) 5000 units TABS Take 5,000 Units by mouth daily.     .Marland Kitchenesomeprazole (NEXIUM) 40 MG capsule Take 40 mg by mouth 2 (two) times daily before a meal.   5  . metoCLOPramide (REGLAN) 5 MG tablet Take 1 tablet (5 mg total) by mouth 2 (two) times daily before a meal. 60 tablet 3  . Multiple Vitamins-Minerals (MEGA MULTIVITAMIN) POWD Take 1 Scoop by mouth daily. Mix 1 scoop in water    . mycophenolate (CELLCEPT) 500 MG tablet Take 3 tablets by mouth every morning and every evening.    .Marland Kitchenspironolactone (ALDACTONE) 25 MG tablet Take 1 tablet (25 mg total) by mouth daily. 30 tablet 6  . sulfamethoxazole-trimethoprim (BACTRIM DS) 800-160 MG tablet Take 1 tablet by mouth daily.    .Marland Kitchenthyroid (NP THYROID) 120 MG tablet Take 120 mg by  mouth daily.     No current facility-administered medications for this encounter.     Allergies  Allergen Reactions  . Lisinopril Hives and Swelling      Social History   Socioeconomic History  . Marital status: Single    Spouse name: Not on file  . Number of children: 0  . Years of education: Not on file  . Highest education level: Associate degree: occupational, Hotel manager, or vocational program  Occupational History  . Occupation: CMA    Employer: [employer]  Social Needs  . Financial resource strain: Not on file  . Food  insecurity    Worry: Not on file    Inability: Not on file  . Transportation needs    Medical: Not on file    Non-medical: Not on file  Tobacco Use  . Smoking status: Never Smoker  . Smokeless tobacco: Never Used  Substance and Sexual Activity  . Alcohol use: No    Frequency: Never  . Drug use: No  . Sexual activity: Yes  Lifestyle  . Physical activity    Days per week: Not on file    Minutes per session: Not on file  . Stress: Not on file  Relationships  . Social Herbalist on phone: Not on file    Gets together: Not on file    Attends religious service: Not on file    Active member of club or organization: Not on file    Attends meetings of clubs or organizations: Not on file    Relationship status: Not on file  . Intimate partner violence    Fear of current or ex partner: Not on file    Emotionally abused: Not on file    Physically abused: Not on file    Forced sexual activity: Not on file  Other Topics Concern  . Not on file  Social History Narrative   Patient is right-handed. She lives alone in one level home, a few steps to enter.      Family History  Problem Relation Age of Onset  . Hypertension Father   . Hypertension Sister   . Hypertension Brother   . Diabetes Maternal Grandfather   . Diabetes Maternal Uncle   . Diabetes Mother   . Colon cancer Neg Hx     Vitals:   01/30/19 1201  BP: (!) 152/100  Pulse: 72  SpO2: 98%  Weight: 60.6 kg (133 lb 9.6 oz)    PHYSICAL EXAM: General:  Well appearing. No resp difficulty HEENT: normal Neck: supple. JVP 7-8. Carotids 2+ bilat; no bruits. No lymphadenopathy or thryomegaly appreciated. Cor: PMI nondisplaced. Regular rate & rhythm. No rubs, gallops or murmurs. Lungs: clear Abdomen: soft, nontender, + distended. No hepatosplenomegaly. No bruits or masses. Good bowel sounds. Extremities: no cyanosis, clubbing, rash, edema Neuro: alert & orientedx3, cranial nerves grossly intact. moves all 4  extremities w/o difficulty. Affect pleasant   ASSESSMENT & PLAN:  1. Dyspnea - Much worse today. Having symptoms of volume overload but previous RHC with PCWP of 6 - However we have previously stopped chloritahlidone and weight up and ReDS reading today very high 49%, recheck 50% suggestive of pulmonary edema or pneumonitis - Start lasix 40 daily and kcl 20 daily - CXR today - BMET, CBC, BNP, ESR - RHC 5/19 with minimally elevated PA pressures  - Echo 5/19: EF 60-65% grade I DD RV normal. Mild TR.  - Echo 11/18/18: EF hyperdynamic 65-70% RV normal Grade 1  DD - Can consider repeat RHC as needed. D/w Dr. Vaughan Browner  2. HTN - BP high here should improve with diuresis  3. Scleroderma - Followed by Dr Trudie Reed - see discussion above  4. Pulmonary fibrosis - High res CT chest c/w with ILD. Repeat pending for next week  - Followed by Dr Vaughan Browner - Continue OFev  Total time spent 35 minutes. Over half that time spent discussing above.    Glori Bickers, MD 01/30/19

## 2019-01-30 NOTE — Patient Instructions (Addendum)
We will check some labs today including comprehensive metabolic panel and a CBC with differential We will refer you back to [facility] lung transplant We will check an overnight oximetry to ensure there are no desaturations at night  Follow-up in 3 months.

## 2019-01-30 NOTE — Progress Notes (Addendum)
Karen [name]    IM:[id number]    June 30, 1970  Primary Care Physician:Gosrani, Doristine Johns, MD  Referring Physician: [name], MD [place],  [place] [address]  Chief complaint: Follow-up for scleroderma ILD  HPI: 48 year old with history of hypertension, headaches, scleroderma with interstitial lung disease  Diagnosed with scleroderma in early 2019 with NSIP fibrosis on CT scan Underwent catheterization by Dr. Haroldine Laws on 09/27/17 with no evidence of pulmonary hypertension Started on CellCept May 2019 and uptitrated to max dose of 1.5 mg twice daily.  She had a hospitalization in [facility] in March 2019 for hypertension.  A CT chest scan at that time showed subtle lower lung findings of atelectasis, groundglass.  She has a right thyroid nodule which was biopsied on 08/15/17 with pathology showing benign findings.  Thyroid function tests are normal.  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  Reviewed note from Dr. Trudie Reed, rheumatology 07/12/2017 Seen for joint pain, Raynaud's, elevated CK, skin thickening Serologies positive for Sjogren's negative,, normal CK and aldolase.  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  No mold. No hot tub, Jacuzzi. She has down comforter.  Smoking history: Never smoker Travel history: Grew up in [place].  Lived in [place] and [place] Relevant family history: No family history of lung disease.    Interim history: Continues on CellCept 1.5 mg twice daily.   States that breathing is stable.  She does note increased cough and chest tightness at night She is return to work full-time.  We had tried to get her on Ofev but has been denied thrice by insurance company.  She is currently working through [employer] cares program to get patient assistance for therapy.    Outpatient Encounter Medications as of 01/30/2019  Medication Sig  . bisoprolol (ZEBETA) 5 MG tablet Take 1 tablet (5 mg total) by mouth daily.  . Cholecalciferol (VITAMIN D-3) 5000 units TABS Take 5,000 Units by mouth daily.   Marland Kitchen esomeprazole (NEXIUM) 40 MG capsule Take 40 mg by mouth 2 (two) times daily before a meal.   . metoCLOPramide (REGLAN) 5 MG tablet Take 1 tablet (5 mg total) by mouth 2 (two) times daily before a meal.  . Multiple Vitamins-Minerals (MEGA MULTIVITAMIN) POWD Take 1 Scoop by mouth daily. Mix 1 scoop in water  . mycophenolate (CELLCEPT) 500 MG tablet Tale 3 tablets in the AM (1500mg  total) and 3 tablets in the PM (1500mg ) (Patient taking differently: Take 1,500 mg by mouth 2 (two) times daily. )  . spironolactone (ALDACTONE) 25 MG tablet Take 1 tablet (25 mg total) by mouth daily.  Marland Kitchen sulfamethoxazole-trimethoprim (BACTRIM DS) 800-160 MG tablet 1 tablet daily (Patient taking differently: Take 1 tablet by mouth daily. )  . thyroid (NP THYROID) 120 MG tablet Take 120 mg by mouth daily.   No facility-administered encounter medications on file as of 01/30/2019.     Physical Exam: Blood pressure 110/70, pulse 74, temperature 97.7 F (36.5 C), temperature source Temporal, height 5\' 5"  (1.651 m), weight 132 lb 9.6 oz (60.1 kg), SpO2 99 %. Gen:      No acute distress HEENT:  EOMI, sclera anicteric Neck:     No masses; no thyromegaly Lungs:    Basal crackles. CV:         Regular rate and rhythm;  no murmurs Abd:      + bowel sounds; soft, non-tender; no palpable masses, no distension Ext:    No edema; adequate peripheral perfusion Skin:      Warm and dry; no rash Neuro: alert and oriented x 3 Psych: normal mood and affect  Data Reviewed: Imaging CT chest, [facility] 07/24/2017 Dependent atelectasis, subtle groundglass opacities at the base.  2.5 cm right thyroid nodule.  I have reviewed the images personally  CT high-resolution 09/18/2017- patchy reticular groundglass  opacities, reticulation with subpleural sparing.  Mild traction bronchiectasis.  No honeycombing.  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  Aortic atherosclerosis, three-vessel coronary artery disease.  CT high-resolution 06/27/2018-stable interstitial lung disease. I reviewed the images personally.  PFTs FENO 09/06/2017-unable to complete  07/27/2017 FVC 1.65 (54%), FEV1 1.53 (62%], F/F 93, TLC 50, DLCO 44%, DLCO/VA 131%  10/25/2017 FVC 1.51 [50%], FEV1 1.26 [51%], F/F 83  01/29/2018 FVC 1.69 [5%), FEV1 1.50 [61%], F/F 89, DLCO 10.57 [41%]  07/01/2018 FVC 1.57 [52%), FEV1 1.47 [60%], F/F 93, DLCO 12.54 [59%)  01/06/2019 FVC 1.58 [52%], FEV1 1.46 [60%], F/F 92, TLC 2.62 [50%], DLCO 9.16 [42%] Severe restriction and diffusion impairment.  FVC is stable but DLCO has worsened.  6-minute walk  10/23/2017- 144 m Post walk heart rate, stats 94, 91%  02/04/2018- 249 m Post walk heart rate, sats 101, 99%  06/06/2018-212 m Post walk heart rate, sats 86, 92%  01/30/2019-130m Post walk heart rate, sats 83, 98%  Labs 08/20/2017-ANA, centromere, rheumatoid factor, SCL 70-negative QuantiFERON 09/12/2017-negative Hepatitis B, C screening 09/21/2017-negative G6PD 09/25/2017-13  Labs from Dr. Trudie Reed 07/12/17 Rheumatoid factor < 10, CCP-8, ANCA-negative, double-stranded DNA-negative, Smith antibody-negative, SCL 70-negative, RNP-negative SSA 5.8, SSB 1.5 Smith antibody-negative, ANA IFA-negative Myositis panel-negative CK 165, C4-30, C3-168, total complement greater than 60, aldolase 8.8 Sed rate 9, CRP 0.9  CBC, hepatic function panel 06/17/2018-within normal limits.  Cardiac Cardiac cath 09/27/17 RA = 2 RV = 33/6 PA = 32/10 (19) PCW = 6 Fick cardiac output/index = 5.0/3.0 PVR = 2.6 Ao sat = 99% PA sat = 74%, 75%  Assessment:  Scleroderma ILD Continue on CellCept 1.5 g twice daily, Bactrim prophylaxis.  Check  CBC and CMP today Clinically she is stable but PFTs show reduction in DLCO and there is reduction in 6-minute walk test Awaiting high-resolution CT for reevaluation of ILD No evidence of pulmonary hypertension on right heart catheterization in the past. Did not desat today on exertion.  Will check overnight oximetry  We have been trying to get her approved for Ofev as she will benefit from anti-fibrotic based on SENSICUS trial.  Https://www.nejm.org/doi/10.1056/NEJMoa1903076 Will work with going [place] to get this approved as soon as possible.  She has been referred to lung transplant at [facility] but clinic visit has been on hold on hold due to foot surgery.  We will make a referral again  Esophageal dysfunction, esophageal stricture, GERD Continue on Protonix 20 mg twice daily Follows with [facility] gastroenterology  Plan/Recommendations: - Continue CellCept at current dose of 1.5 twice daily. - CBC, CMP - High-resolution CT, overnight oximetry - Referral to [facility] transplant - Bactrim prophylaxis. - Paperwork for Mertie Clause MD [place] [facility] 01/30/2019, 9:30 AM   Addendum Overnight oximetry 01/30/2019- test time 8 hours 25 minutes Lowest O2 sat 87%.  Mean O2 sat 95%.  Time spent less than 88% - 1 minute 30 seconds No  significant desaturation.  Does not need supplemental oxygen.  Marshell Garfinkel MD [place] [facility] 02/13/2019, 1:42 PM

## 2019-01-30 NOTE — Progress Notes (Signed)
6 minute walk completed today 01/30/19.

## 2019-01-31 NOTE — Telephone Encounter (Signed)
[facility] heart failure clinic.  Multiple rings, no answer, and no VM.

## 2019-02-03 ENCOUNTER — Other Ambulatory Visit (HOSPITAL_COMMUNITY): Payer: Self-pay | Admitting: *Deleted

## 2019-02-03 ENCOUNTER — Ambulatory Visit (HOSPITAL_COMMUNITY)
Admission: RE | Admit: 2019-02-03 | Discharge: 2019-02-03 | Disposition: A | Discharge status: Home / Self Care | Payer: 59 | Source: Ambulatory Visit | Attending: Internal Medicine | Admitting: Internal Medicine

## 2019-02-03 ENCOUNTER — Other Ambulatory Visit: Payer: Self-pay

## 2019-02-03 DIAGNOSIS — M349 Systemic sclerosis, unspecified: Secondary | ICD-10-CM

## 2019-02-03 DIAGNOSIS — I5032 Chronic diastolic (congestive) heart failure: Secondary | ICD-10-CM

## 2019-02-03 DIAGNOSIS — I272 Pulmonary hypertension, unspecified: Secondary | ICD-10-CM

## 2019-02-03 NOTE — Progress Notes (Signed)
Heart Failure TeleHealth Note  Due to national recommendations of social distancing due to [place] 19, Audio/video telehealth visit is felt to be most appropriate for this patient at this time.  See MyChart message from today for patient consent regarding telehealth for [facility].  Date:  02/03/2019   ID:  Karen [name], DOB 1970-07-26, MRN [id number]  Location: Home  Provider location: [place] [facility] Type of Visit: Established patient  PCP:  [name], MD  Cardiologist:  Mertie Moores, MD Primary HF: Guelda Batson  Chief Complaint: Heart Failure follow-up   History of Present Illness:  HPI: Karen [name] is a 48 y.o. female [place] at [facility] (with [name]) with a history of Sjogren's syndrome, multinodular goiter, HTN and recently diagnosed scleroderma referred by Dr. Gavin Pound for evaluation of pulmonary HTN in the setting of scleroderma.  RHC in 5/19 with minimally elevated pressures and hi-res CT which showed ILD. Follows with Dr. Vaughan Browner . F/u CT in 2/20 showed stable ILD  She presents via audio/video conferencing for a telehealth visit today. We saw her last week and was markedly SOB and ReDS very high @ 49%. CXR with mild pulmonary vascular congestion. ESR normal. Started lasix 40 daily and kcl 20 daily. Feeling a lot better. Breathing better. Less bloated. Ankle swelling resolved. No orthopnea or PND. Does not have a scale to weigh herself.  Not taking her BPs at home.   Studies:  Echo 11/18/18: EF hyperdynamic 65-70% RV normal   Echo 5/19: EF 60-65% grade I DD RV normal. Mild TR.   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 Aortic atherosclerosis, three-vessel coronary artery disease.   CT high-resolution 06/27/2018-stable interstitial lung disease. I reviewed the images personally.  PFTs  FENO 09/06/2017-unable to complete  07/27/2017 FVC 1.65 (54%), FEV1 1.53 (62%], F/F 93, TLC 50, DLCO 44%, DLCO/VA 131%  10/25/2017 FVC 1.51 [50%], FEV1 1.26 [51%], F/F 83  01/29/2018 FVC 1.69 [5%), FEV1 1.50 [61%], F/F 89, DLCO 10.57 [41%]  07/01/2018 FVC 1.57 [52%), FEV1 1.47 [60%], F/F 93, DLCO 12.54 [59%)  01/06/2019 FVC 1.58 [52%], FEV1 1.46 [60%],F/F 92, TLC 2.62 [50%], DLCO 9.16 [42%] Severe restriction and diffusion impairment. FVC is stable but DLCO has worsened.  6-minute walk  10/23/2017- 144 m Post walk heart rate, stats 94, 91%  02/04/2018-249 m Post walk heart rate, sats 101, 99%  06/06/2018-212 m Post walk heart rate, sats 86, 92%  01/30/2019-161mPost walk heart rate, sats 83, 98%      MNorvella Loscalzodenies symptoms worrisome for COVID 19.   Past Medical History:  Diagnosis Date  . GERD (gastroesophageal reflux disease)   . Hypertension   . Hypothyroidism   . ILD (interstitial lung disease) ([place] 02/08/2018  . Interstitial lung disease ([place]   . Multinodular thyroid    benign FNA 08/2017.  .Marland KitchenScleroderma ([facility]    Past Surgical History:  Procedure Laterality Date  . ABDOMINAL HERNIA REPAIR    . BUNIONECTOMY Bilateral 10/2018  . COLONOSCOPY WITH PROPOFOL N/A 01/02/2019   Procedure: COLONOSCOPY WITH PROPOFOL;  Surgeon: RDaneil Dolin MD;  Location: [facility];  Service: Endoscopy;  Laterality: N/A;  12:30pm  .  ESOPHAGOGASTRODUODENOSCOPY (EGD) WITH PROPOFOL N/A 01/28/2018   erosive reflux esophagitis, patulous EG Junction, no dilation, incomplete EGD due to retained food in stomach. GES thereafter with delayed gastric emptying.   Marland Kitchen RIGHT HEART CATH N/A 09/27/2017   Procedure: RIGHT HEART CATH;  Surgeon: Jolaine Artist, MD;  Location: [place] CV LAB;  Service: Cardiovascular;  Laterality: N/A;  . UTERINE FIBROID SURGERY        Current Outpatient Medications  Medication Sig Dispense Refill  . bisoprolol (ZEBETA) 5 MG tablet Take 1 tablet (5 mg total) by mouth daily. 30 tablet 1  . Cholecalciferol (VITAMIN D-3) 5000 units TABS Take 5,000 Units by mouth daily.     Marland Kitchen esomeprazole (NEXIUM) 40 MG capsule Take 40 mg by mouth 2 (two) times daily before a meal.   5  . furosemide (LASIX) 40 MG tablet Take 1 tablet (40 mg total) by mouth daily. 30 tablet 5  . metoCLOPramide (REGLAN) 5 MG tablet Take 1 tablet (5 mg total) by mouth 2 (two) times daily before a meal. 60 tablet 3  . Multiple Vitamins-Minerals (MEGA MULTIVITAMIN) POWD Take 1 Scoop by mouth daily. Mix 1 scoop in water    . mycophenolate (CELLCEPT) 500 MG tablet Take 3 tablets by mouth every morning and every evening.    . potassium chloride 20 MEQ/15ML (10%) SOLN Take 15 mLs (20 mEq total) by mouth daily. 473 mL 0  . spironolactone (ALDACTONE) 25 MG tablet Take 1 tablet (25 mg total) by mouth daily. 30 tablet 6  . sulfamethoxazole-trimethoprim (BACTRIM DS) 800-160 MG tablet Take 1 tablet by mouth daily.    Marland Kitchen thyroid (NP THYROID) 120 MG tablet Take 120 mg by mouth daily.     No current facility-administered medications for this encounter.     Allergies:   Lisinopril   Social History:  The patient  reports that she has never smoked. She has never used smokeless tobacco. She reports that she does not drink alcohol or use drugs.   Family History:  The patient's family history includes Diabetes in her maternal grandfather, maternal uncle, and mother; Hypertension in her brother, father, and sister.   ROS:  Please see the history of present illness.   All other systems are personally reviewed and negative.   Exam:  (Video/Tele Health Call; Exam is subjective and or/visual.) General:  Speaks in full sentences. No resp difficulty. Lungs: Normal respiratory effort with conversation.  Abdomen: Non-distended per patient report Extremities: Pt denies edema. Neuro: Alert  & oriented x 3.   Recent Labs: 11/18/2018: TSH 2.842 01/30/2019: ALT 13; B Natriuretic Peptide 199.4; BUN 12; Creatinine, Ser 0.61; Hemoglobin 11.7; Platelets 270; Potassium 4.0; Sodium 139  Personally reviewed   Wt Readings from Last 3 Encounters:  01/30/19 60.6 kg (133 lb 9.6 oz)  01/30/19 60.1 kg (132 lb 9.6 oz)  01/02/19 56.7 kg (125 lb)      ASSESSMENT AND PLAN:  1. Acute on chronic diastolic HF - We saw her last week and was markedly SOB and ReDS very high @ 49% - CXR with mild pulmonary vascular congestion. ESR normal - Started lasix 40 daily and kcl 20 daily - Now much better. Will switch lasix to M/W/F + PRN - Check labs tomorrow - RHC 5/19 with minimally elevated PA pressures  - Echo 5/19: EF 60-65% grade I DD RV normal. Mild TR.  - Echo 11/18/18: EF hyperdynamic 65-70% RV normal Grade 1 DD - Can consider repeat RHC as needed. D/w Dr. Vaughan Browner  2. HTN - BP high last week. Will have her follow BPs at home  3. Scleroderma - Followed by Dr Trudie Reed - see discussion above  4. Pulmonary fibrosis - High res CT chest c/w with ILD. Repeat pending for next week  - Followed by Dr Vaughan Browner - Working on getting her Ofev. Continue Cellcept   COVID screen The patient does not have any symptoms that suggest any further testing/ screening at this time.  Social distancing reinforced today.  Recommended follow-up:  As above  Relevant cardiac medications were reviewed at length with the patient today.   The patient does not have concerns regarding their medications at this time.   The following changes were made today:  As above  Today, I have spent 14 minutes with the patient with telehealth technology discussing the above issues .    Signed, Glori Bickers, MD  02/03/2019 4:50 PM  Advanced Heart Failure [place] [address] [facility] and [place] [address] [phone] (office) [phone] (fax)

## 2019-02-03 NOTE — Telephone Encounter (Signed)
LMTCB. Patient already had labs drawn. Will need to come to office for remainder labs.

## 2019-02-04 ENCOUNTER — Encounter (HOSPITAL_COMMUNITY): Payer: Self-pay | Admitting: *Deleted

## 2019-02-04 MED ORDER — FUROSEMIDE 40 MG PO TABS: 40.0000 mg | ORAL_TABLET | ORAL | 5 refills | Status: DC

## 2019-02-04 MED ORDER — POTASSIUM CHLORIDE 20 MEQ/15ML (10%) PO SOLN: 20.0000 meq | ORAL | 0 refills | Status: DC

## 2019-02-04 NOTE — Addendum Note (Signed)
Encounter addended by: Scarlette Calico, RN on: 02/04/2019 9:55 AM  Actions taken: Order list changed, Diagnosis association updated, Clinical Note Signed

## 2019-02-04 NOTE — Progress Notes (Signed)
Orders placed per Dr Haroldine Laws, AVS completed and sent to pt via mychart, message sent to schedulers to arrange f/u appt.

## 2019-02-04 NOTE — Patient Instructions (Addendum)
Please have labs drawn at [facility], the orders are in epic  Decrease Furosemide and Potassium to only Monday, Wednesday and Friday and can take it other days as needed.  Our office will contact you to schedule a follow up appointment for 4-6 weeks with Dr Haroldine Laws.  If you have any questions or concerns before your next appointment please send [place] a message through [place] or call our office at [phone].

## 2019-02-04 NOTE — Telephone Encounter (Signed)
Patient has been seen by cardiology and has additional labs ordered. Nothing further at this time.

## 2019-02-05 ENCOUNTER — Ambulatory Visit (INDEPENDENT_AMBULATORY_CARE_PROVIDER_SITE_OTHER)
Admission: RE | Admit: 2019-02-05 | Discharge: 2019-02-05 | Disposition: A | Discharge status: Home / Self Care | Payer: 59 | Source: Ambulatory Visit | Attending: Pulmonary Disease | Admitting: Pulmonary Disease

## 2019-02-05 ENCOUNTER — Other Ambulatory Visit: Payer: Self-pay

## 2019-02-05 ENCOUNTER — Telehealth (HOSPITAL_COMMUNITY): Payer: Self-pay

## 2019-02-05 DIAGNOSIS — M79672 Pain in left foot: Secondary | ICD-10-CM | POA: Diagnosis not present

## 2019-02-05 DIAGNOSIS — M25675 Stiffness of left foot, not elsewhere classified: Secondary | ICD-10-CM | POA: Diagnosis not present

## 2019-02-05 DIAGNOSIS — R269 Unspecified abnormalities of gait and mobility: Secondary | ICD-10-CM | POA: Diagnosis not present

## 2019-02-05 DIAGNOSIS — I5032 Chronic diastolic (congestive) heart failure: Secondary | ICD-10-CM | POA: Diagnosis not present

## 2019-02-05 DIAGNOSIS — M25674 Stiffness of right foot, not elsewhere classified: Secondary | ICD-10-CM | POA: Diagnosis not present

## 2019-02-05 DIAGNOSIS — J849 Interstitial pulmonary disease, unspecified: Secondary | ICD-10-CM

## 2019-02-05 DIAGNOSIS — M79671 Pain in right foot: Secondary | ICD-10-CM | POA: Diagnosis not present

## 2019-02-05 NOTE — Telephone Encounter (Signed)
Pt called in to document her weight.  Weight today is 127.8 lb.  Pt also stated that she went to have labs done at [facility] and orders was not  Viewable.  New orders placed.   Dr Haroldine Laws, pt weight is 127.8

## 2019-02-06 ENCOUNTER — Ambulatory Visit (INDEPENDENT_AMBULATORY_CARE_PROVIDER_SITE_OTHER): Payer: 59

## 2019-02-06 ENCOUNTER — Other Ambulatory Visit: Payer: Self-pay

## 2019-02-06 ENCOUNTER — Ambulatory Visit (INDEPENDENT_AMBULATORY_CARE_PROVIDER_SITE_OTHER): Payer: 59 | Admitting: Podiatry

## 2019-02-06 ENCOUNTER — Encounter: Payer: Self-pay | Admitting: Podiatry

## 2019-02-06 DIAGNOSIS — M79672 Pain in left foot: Secondary | ICD-10-CM | POA: Diagnosis not present

## 2019-02-06 DIAGNOSIS — M2041 Other hammer toe(s) (acquired), right foot: Secondary | ICD-10-CM

## 2019-02-06 DIAGNOSIS — M79671 Pain in right foot: Secondary | ICD-10-CM | POA: Diagnosis not present

## 2019-02-06 DIAGNOSIS — M25674 Stiffness of right foot, not elsewhere classified: Secondary | ICD-10-CM | POA: Diagnosis not present

## 2019-02-06 DIAGNOSIS — M2042 Other hammer toe(s) (acquired), left foot: Secondary | ICD-10-CM | POA: Diagnosis not present

## 2019-02-06 DIAGNOSIS — M25675 Stiffness of left foot, not elsewhere classified: Secondary | ICD-10-CM | POA: Diagnosis not present

## 2019-02-06 DIAGNOSIS — R269 Unspecified abnormalities of gait and mobility: Secondary | ICD-10-CM | POA: Diagnosis not present

## 2019-02-06 MED ORDER — MELOXICAM 15 MG PO TABS: 15.0000 mg | ORAL_TABLET | Freq: Every day | ORAL | 2 refills | Status: DC

## 2019-02-06 MED FILL — MELOXICAM 15 MG TABLET: 15 | 30 days supply | Qty: 30 | Fill #0

## 2019-02-12 IMAGING — US US BIOPSY FNA W/ IMAGING
1 series · 13 of 22 positions shown · non-contrast
Comparison: US Soft Tissue Head Neck 07/25/17

INDICATION: Indeterminate thyroid nodule

Right mid nodule: 3.5 cm
Left inferior nodule: 2.5 cm
EXAM:
ULTRASOUND GUIDED FINE NEEDLE ASPIRATION OF INDETERMINATE THYROID
NODULE
TECHNIQUE: Informed written consent was obtained from the patient after a
discussion of the risks, benefits and alternatives to treatment.
Questions regarding the procedure were encouraged and answered. A
timeout was performed prior to the initiation of the procedure.

[Series 1: us biopsy fna w/ imaging · 0.06mm/px · 13 of 22 slices shown]
[im 1/22]
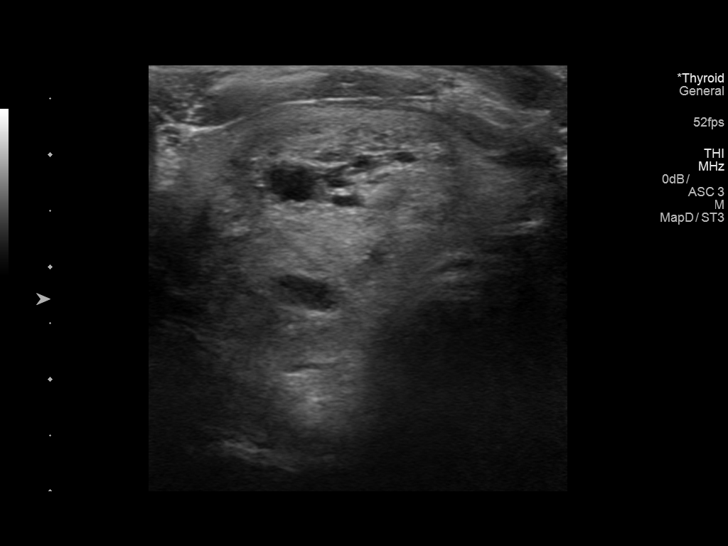
[im 3/22]
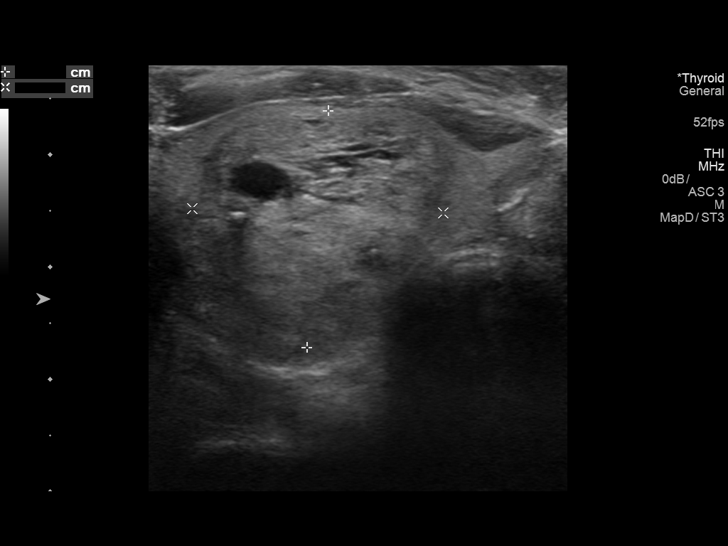
[im 5/22]
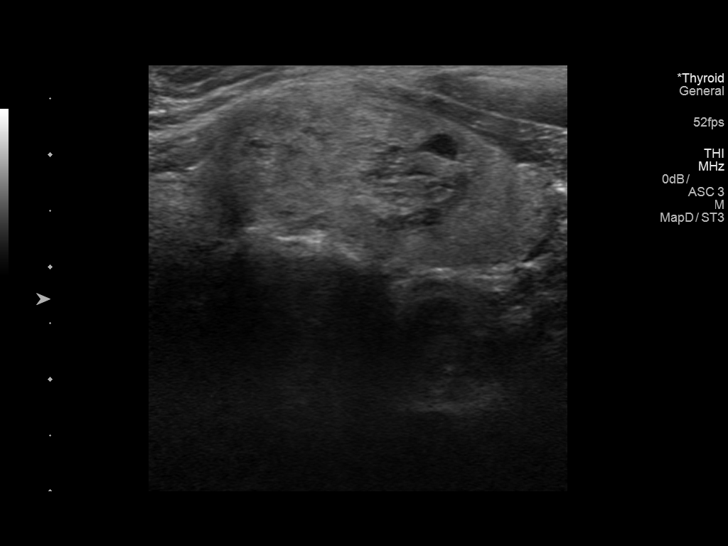
[im 6/22]
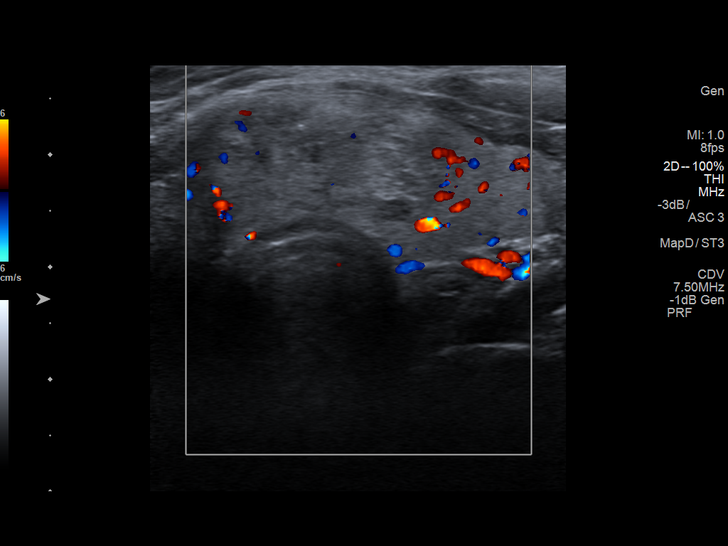
[im 8/22]
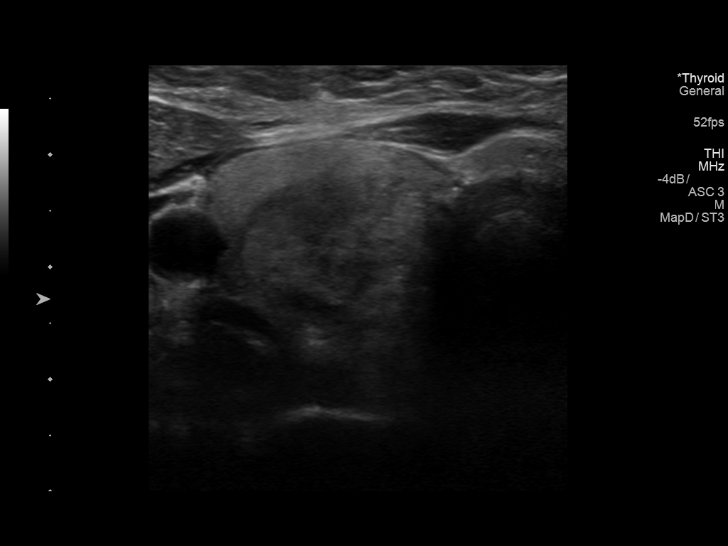
[im 10/22]
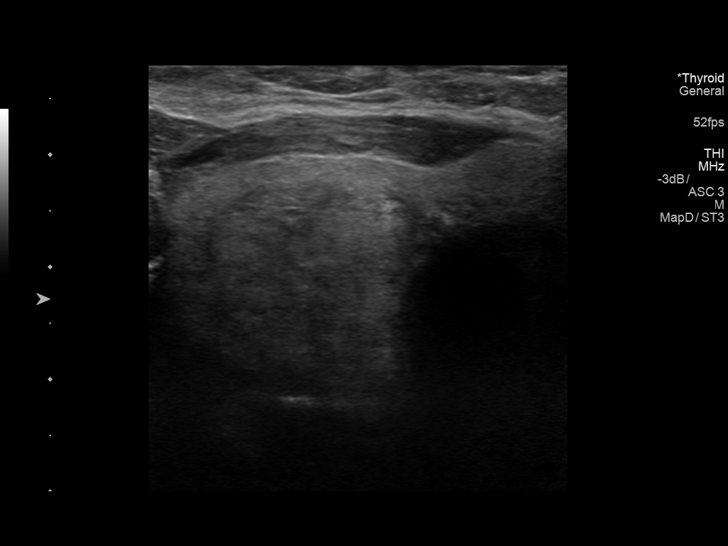
[im 12/22]
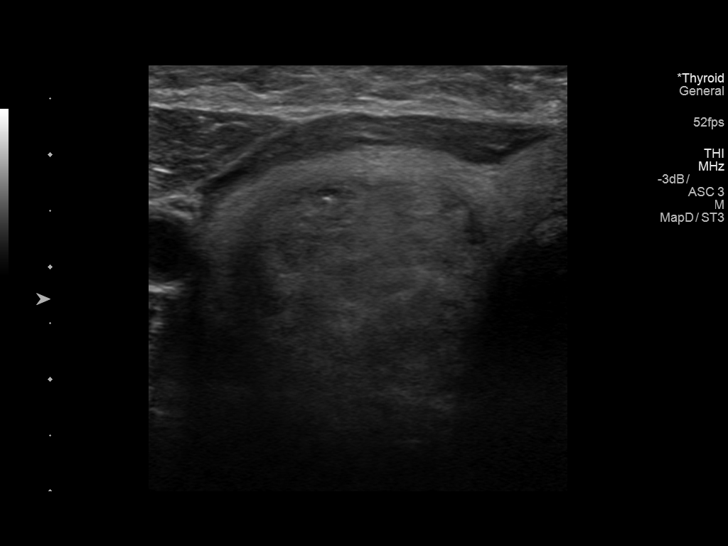
[im 13/22]
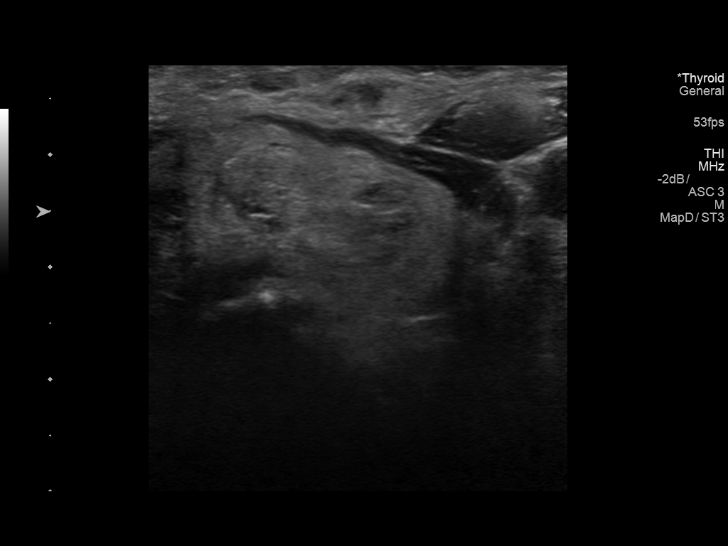
[im 15/22]
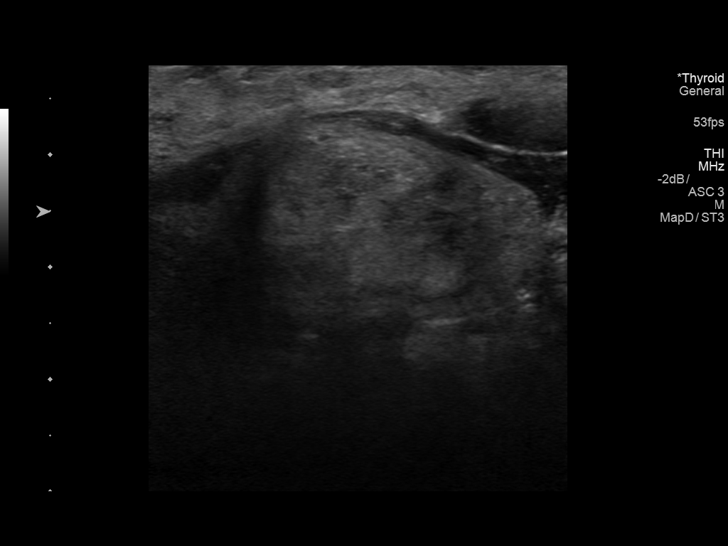
[im 17/22]
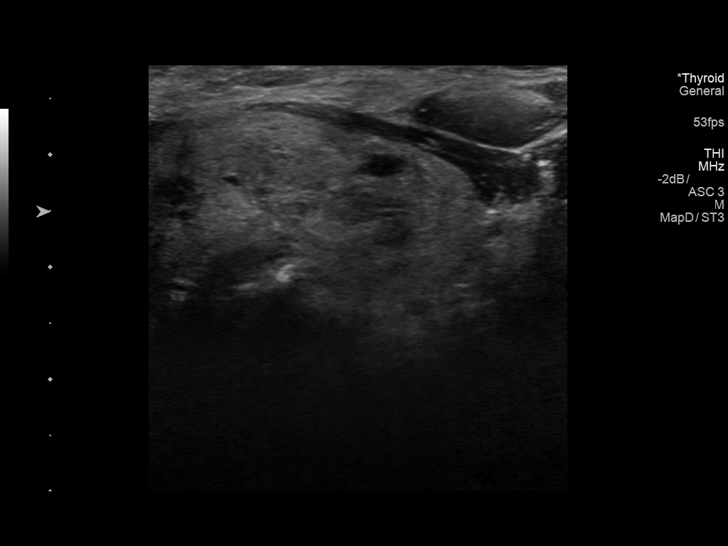
[im 18/22]
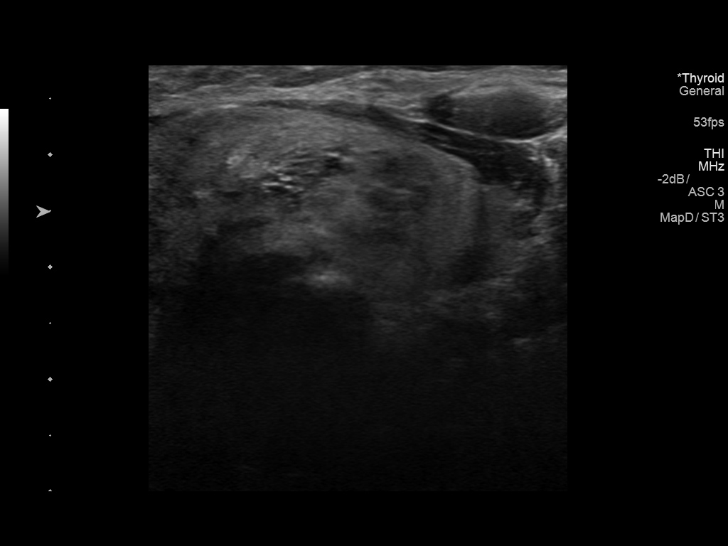
[im 20/22]
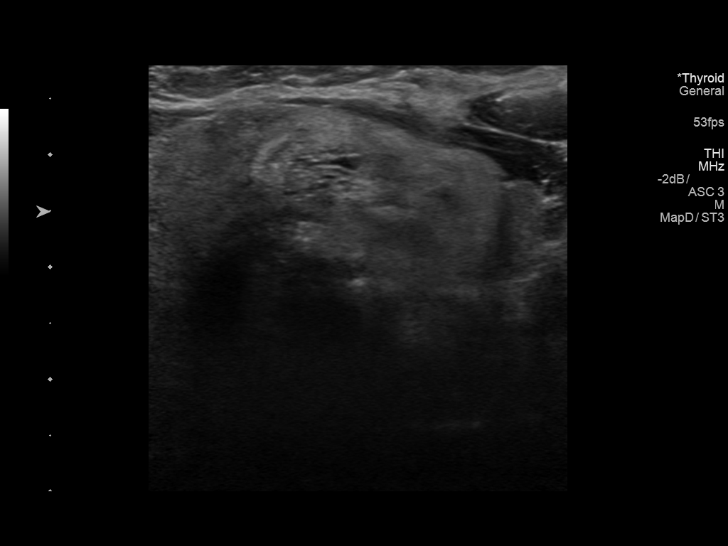
[im 22/22]
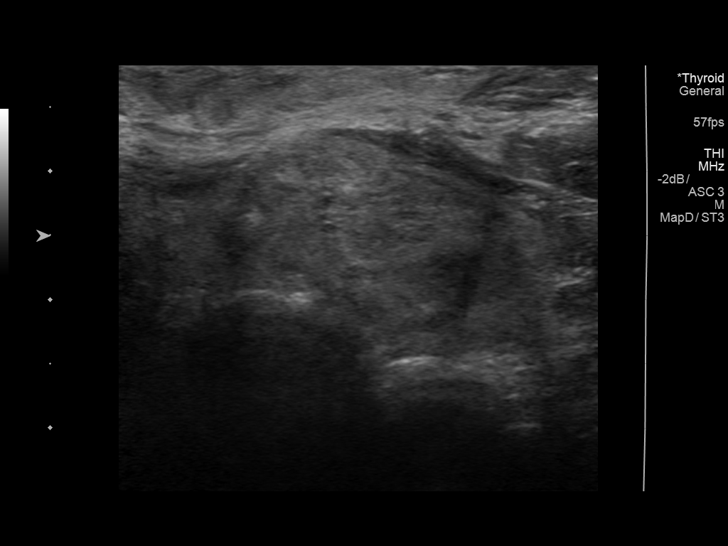

[13 of 22 positions shown; findings below may reference images not displayed]

[GENCHO TSVETELINA]-[place]

MEDICATIONS:
15 cc 1% lidocaine

COMPLICATIONS:
None immediate
Pre-procedural ultrasound scanning demonstrated unchanged size and
appearance of the indeterminate nodules within the right and left
thyroid

The procedure was planned. The neck was prepped in the usual sterile
fashion, and a sterile drape was applied covering the operative
field. A timeout was performed prior to the initiation of the
procedure. Local anesthesia was provided with 1% lidocaine.

Under direct ultrasound guidance, 5 FNA biopsies were performed of
the right mid lobe thyroid with a 25 gauge needle.

2 of these were obtained for AFIRMA as per ordering GODOY.

Multiple ultrasound images were saved for procedural documentation
purposes. The samples were prepared and submitted to pathology.

Under direct ultrasound guidance, 6 FNA biopsies were performed of
the left inferior thyroid nodule with a 25 gauge needle.

2 of these samples were obtained for AFIRMA as per ordering GODOY.

Multiple ultrasound images were saved for procedural documentation
purposes. The samples were prepared and submitted to pathology.

Limited post procedural scanning was negative for hematoma or
additional complication. Dressings were placed. The patient
tolerated the above procedures procedure well without immediate
postprocedural complication.
FINDINGS: Nodule reference number based on prior diagnostic ultrasound: 1

Maximum size: 3.5 cm

Location: Right; Mid

ACR TI-RADS risk category: TR3 (3 points)

Reason for biopsy: meets ACR TI-RADS criteria

_________________________________________________________

Nodule reference number based on prior diagnostic ultrasound: 2

Maximum size: 2.5 cm

Location: Left; Inferior

ACR TI-RADS risk category: TR3 (3 points)

Reason for biopsy: meets ACR TI-RADS criteria

Ultrasound imaging confirms appropriate placement of the needles
within the thyroid nodule.
IMPRESSION: 1. Technically successful ultrasound guided fine needle aspiration
of right mid lobe thyroid nodule
2. Technically successful ultrasound guided fine needle aspiration
of left inferior thyroid nodule

Read by

Jose Omar Leah

## 2019-02-12 NOTE — Progress Notes (Signed)
Subjective:   Patient ID: Karen [name], female   DOB: 48 y.o.   MRN: [id number]   HPI Patient overall states she is doing well but she still has discomfort more in the right over the left foot and states she still gets some swelling if she is on her feet all the time.  She is wearing normal shoes at this time and is able to make it through her day but can get some discomfort at night   ROS      Objective:  Physical Exam  Neurovascular status intact with patient noted to have good structural alignment of the lesser digits with incisions intact and good range of motion first MPJ bilateral with mild edema consistent with the postoperative process     Assessment:  Overall doing well with mild discomfort more right foot over left which was done 5 weeks after the left foot     Plan:  H&P conditions reviewed and explained is normal to still have mild discomfort 12 weeks after surgery but it should continue to improve but it is a 6 to 95-month process most likely to get on percent better.  At this point we will utilize anti-inflammatories and I gave instructions for continued elevation at night ice as needed and patient will be seen back 6 weeks or earlier if needed  X-rays indicate osteotomies are healing well with mild stress on the fifth metatarsal right but I do think it will heal uneventfully at this position over time.  Fixation in place patient encouraged to let us know with any concerns issues which may come up

## 2019-02-13 ENCOUNTER — Telehealth: Payer: Self-pay | Admitting: Internal Medicine

## 2019-02-13 NOTE — Telephone Encounter (Signed)
On call- patient was returning a call "from the other day from [facility]". She will call the office during office hours to see what that was about.

## 2019-02-17 MED FILL — CLINDAMYCIN PHOS-BENZOYL PE: 1-5 | 15 days supply | Qty: 25 | Fill #0

## 2019-02-20 ENCOUNTER — Telehealth: Payer: Self-pay | Admitting: Pulmonary Disease

## 2019-02-20 NOTE — Telephone Encounter (Signed)
Spoke with patient.  She said someone was calling with results  Last ov was 01/30/19. Looks like patient had labs but they were not resulted.  Dr. Vaughan Browner please advise on lab results  Danae Chen stated she had called the patient in regards to getting a signature from patient to start Ofev. She states she is only off on Thursdays. She lives in [place] and won't be able to make it over here until next Thursday 02/27/19. I said this would be okay but would let Dr. Vaughan Browner aware.

## 2019-02-27 ENCOUNTER — Other Ambulatory Visit: Payer: Self-pay

## 2019-02-27 ENCOUNTER — Ambulatory Visit (INDEPENDENT_AMBULATORY_CARE_PROVIDER_SITE_OTHER): Payer: 59 | Admitting: Internal Medicine

## 2019-02-27 ENCOUNTER — Encounter (INDEPENDENT_AMBULATORY_CARE_PROVIDER_SITE_OTHER): Payer: Self-pay | Admitting: Internal Medicine

## 2019-02-27 VITALS — BP 138/71 | HR 76 | Temp 98.0°F | Ht 65.0 in | Wt 132.0 lb

## 2019-02-27 DIAGNOSIS — N951 Menopausal and female climacteric states: Secondary | ICD-10-CM | POA: Diagnosis not present

## 2019-02-27 DIAGNOSIS — K219 Gastro-esophageal reflux disease without esophagitis: Secondary | ICD-10-CM | POA: Diagnosis not present

## 2019-02-27 DIAGNOSIS — I10 Essential (primary) hypertension: Secondary | ICD-10-CM

## 2019-02-27 DIAGNOSIS — F5101 Primary insomnia: Secondary | ICD-10-CM

## 2019-02-27 DIAGNOSIS — E559 Vitamin D deficiency, unspecified: Secondary | ICD-10-CM | POA: Diagnosis not present

## 2019-02-27 DIAGNOSIS — M349 Systemic sclerosis, unspecified: Secondary | ICD-10-CM

## 2019-02-27 HISTORY — DX: Vitamin D deficiency, unspecified: E55.9

## 2019-02-27 HISTORY — DX: Menopausal and female climacteric states: N95.1

## 2019-02-27 MED ORDER — PROGESTERONE MICRONIZED 200 MG PO CAPS: 200.0000 mg | ORAL_CAPSULE | Freq: Every day | ORAL | 3 refills | Status: DC

## 2019-02-27 MED FILL — PROGESTERONE MICRONIZED 200: 200 | 30 days supply | Qty: 30 | Fill #0

## 2019-02-27 NOTE — Telephone Encounter (Signed)
Erica please advise on labs and if the patient is able to complete paperwork so we can close encounter. Thank you!

## 2019-02-27 NOTE — Telephone Encounter (Signed)
Patient has signed the paperwork to restart her Ofev process. I will have to speak with Dr. Vaughan Browner in regards to the [facility]. Patient is very hard to get on the phone and has been called several times in regards to a lot of things.

## 2019-02-27 NOTE — Progress Notes (Signed)
Wellness Office Visit  Subjective:  Patient ID: Karen [name], female    DOB: Jan 24, 1971  Age: 48 y.o. MRN: [id number]  CC: This lady comes in for follow-up of hypertension, hypothyroidism, gastroesophageal reflux disease, vitamin D deficiency and perimenopause. HPI Her periods become more irregular and she is having hot flashes at night.  She also is having difficulty getting consistent sleep at night. Her antihypertensive medication has been changed and she has been found to have diastolic congestive heart failure and sees cardiology and Lasix has been started on her 3 times a week. Today, she does not have symptoms of dyspnea. Since her dose of NP thyroid was increased, she feels more energized than she used to.  Past Medical History:  Diagnosis Date  . GERD (gastroesophageal reflux disease)   . Hypertension   . Hypothyroidism   . ILD (interstitial lung disease) ([place]) 02/08/2018  . Interstitial lung disease ([place])   . Multinodular thyroid    benign FNA 08/2017.  Marland Kitchen Perimenopause 02/27/2019  . Scleroderma ([place])   . Vitamin D deficiency disease 02/27/2019      Family History  Problem Relation Age of Onset  . Hypertension Father   . Hypertension Sister   . Hypertension Brother   . Diabetes Maternal Grandfather   . Diabetes Maternal Uncle   . Diabetes Mother   . Colon cancer Neg Hx     Social History   Social History Narrative   Patient is right-handed. She lives alone in one level home, a few steps to enter.CMA [facility].     Current Meds  Medication Sig  . bisoprolol (ZEBETA) 5 MG tablet Take 1 tablet (5 mg total) by mouth daily.  . Cholecalciferol (VITAMIN D-3) 5000 units TABS Take 5,000 Units by mouth daily.   Marland Kitchen esomeprazole (NEXIUM) 40 MG capsule Take 40 mg by mouth 2 (two) times daily before a meal.   . furosemide (LASIX) 40 MG tablet Take 1 tablet (40 mg total) by mouth 3 (three) times a week. Every Mon/Wed/Fri and other days as needed  .  Multiple Vitamins-Minerals (MEGA MULTIVITAMIN) POWD Take 1 Scoop by mouth daily. Mix 1 scoop in water  . mycophenolate (CELLCEPT) 500 MG tablet Take 3 tablets by mouth every morning and every evening.  . potassium chloride 20 MEQ/15ML (10%) SOLN Take 15 mLs (20 mEq total) by mouth 3 (three) times a week. Every Mon/Wed/Fri and other days when you take Furosemide  . spironolactone (ALDACTONE) 25 MG tablet Take 1 tablet (25 mg total) by mouth daily.  Marland Kitchen sulfamethoxazole-trimethoprim (BACTRIM DS) 800-160 MG tablet Take 1 tablet by mouth daily.  Marland Kitchen thyroid (NP THYROID) 120 MG tablet Take 120 mg by mouth daily.      Objective:   Today's Vitals: BP 138/71 (BP Location: Right Arm, Patient Position: Sitting, Cuff Size: Normal)   Pulse 76   Temp 98 F (36.7 C) (Temporal)   Ht 5\' 5"  (1.651 m)   Wt 132 lb (59.9 kg)   SpO2 98%   BMI 21.97 kg/m  Vitals with BMI 02/27/2019 01/30/2019 01/30/2019  Height 5\' 5"  - -  Weight 132 lbs 133 lbs 10 oz -  BMI [id number] - -  Systolic 0000000 0000000 [id number]  Diastolic 71 [id number] 70  Pulse 76 72 74     Physical Exam   She looks systemically well.  Weight is stable.  Lung fields are clear.  She is alert and orientated without any focal neurological signs.    Assessment  1. Essential hypertension   2. Gastroesophageal reflux disease, unspecified whether esophagitis present   3. 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 Primary insomnia   6. Perimenopause       Tests ordered Orders Placed This Encounter  Procedures  . COMPLETE METABOLIC PANEL WITH GFR  . T3, free  . TSH  . VITAMIN D 25 Hydroxy (Vit-D Deficiency, Fractures)     Plan: 1. She will continue with antihypertensive therapy and this seems to be controlling her blood pressure. 2. She will continue with PPI for acid reflux disease. 3. She will continue vitamin D3 supplementation. 4. I am going to prescribe for her progesterone which will help with perimenopausal symptoms of hot flashes  and poor sleep. 5. Blood work is ordered as above. 6. I will see her in 2 to 3 months for annual physical exam.   Meds ordered this encounter  Medications  . progesterone (PROMETRIUM) 200 MG capsule    Sig: Take 1 capsule (200 mg total) by mouth daily.    Dispense:  30 capsule    Refill:  3    Nimish Luther Parody, MD

## 2019-02-28 ENCOUNTER — Telehealth: Payer: Self-pay | Admitting: Pharmacy Technician

## 2019-02-28 DIAGNOSIS — J849 Interstitial pulmonary disease, unspecified: Secondary | ICD-10-CM

## 2019-02-28 LAB — COMPLETE METABOLIC PANEL WITH GFR
AG Ratio: 1.4 (calc) (ref 1.0–2.5)
ALT: 6 U/L (ref 6–29)
AST: 15 U/L (ref 10–35)
Albumin: 4 g/dL (ref 3.6–5.1)
Alkaline phosphatase (APISO): 63 U/L (ref 37–153)
BUN: 14 mg/dL (ref 7–25)
CO2: 23 mmol/L (ref 20–32)
Calcium: 10.1 mg/dL (ref 8.6–10.4)
Chloride: 105 mmol/L (ref 98–110)
Creat: 0.75 mg/dL (ref 0.50–1.05)
GFR, Est African American: 108 mL/min/{1.73_m2} (ref >=60)
GFR, Est Non African American: 93 mL/min/{1.73_m2} (ref >=60)
Globulin: 2.9 g/dL (calc) (ref 1.9–3.7)
Glucose, Bld: 73 mg/dL (ref 65–99)
Potassium: 3.8 mmol/L (ref 3.5–5.3)
Sodium: 140 mmol/L (ref 135–146)
Total Bilirubin: 0.5 mg/dL (ref 0.2–1.2)
Total Protein: 6.9 g/dL (ref 6.1–8.1)

## 2019-02-28 LAB — T3, FREE: T3, Free: 2.5 pg/mL (ref 2.3–4.2)

## 2019-02-28 LAB — TSH: TSH: 2.41 mIU/L

## 2019-02-28 LAB — VITAMIN D 25 HYDROXY (VIT D DEFICIENCY, FRACTURES): Vit D, 25-Hydroxy: 44 ng/mL (ref 30–100)

## 2019-02-28 NOTE — Telephone Encounter (Signed)
Received Ofev 150mg  BID New Start forms for patient. Will initiate benefits investigation and will update as we receive responses.  3:46 PM Beatriz Chancellor, CPhT

## 2019-03-03 NOTE — Telephone Encounter (Signed)
Submitted a Prior Authorization request to [facility] for Ofev via Cover My Meds. Will update once we receive a response.  PA Case ID: [id number]   Mariella Saa, PharmD, [place], CPP Clinical Specialty Pharmacist [phone]  03/03/2019 10:42 AM

## 2019-03-04 MED FILL — NP THYROID 120 MG TABLET: 120 | 30 days supply | Qty: 30 | Fill #1

## 2019-03-06 ENCOUNTER — Telehealth: Payer: Self-pay | Admitting: Pulmonary Disease

## 2019-03-06 NOTE — Telephone Encounter (Signed)
That is fine 

## 2019-03-06 NOTE — Telephone Encounter (Signed)
Called and spoke to pt. Pt is requesting a handicap placard. Pt states it expires at the end of the month. Advised pt that Dr. Vaughan Browner would be back in by 11/4 to sign the form. Pt states she doesn't want to wait that long since it will be expired by then and is requesting if another provider will sign the placard. Pt last seen 01/30/2019 for ILD by Dr. Vaughan Browner.   TP please advise. Thanks.

## 2019-03-06 NOTE — Telephone Encounter (Signed)
Handicap placard signed by TP Called spoke with patient who verified she would like placard mailed to her - address verified.  Erica w/ Dr Vaughan Browner overheard conversation and reported that handicap placard was placed in the mail when patient was last here in the office on 10.22.2020 to sign Ofev paperowork.  Patient still has not received this.  She would like the placard from today to be mailed as well in case the previous one is not received.  Nothing further needed; will sign off.

## 2019-03-07 NOTE — Telephone Encounter (Signed)
Noted. Will await lab results from Dr. Vaughan Browner

## 2019-03-10 ENCOUNTER — Encounter (HOSPITAL_COMMUNITY): Payer: 59 | Admitting: Internal Medicine

## 2019-03-10 NOTE — Telephone Encounter (Signed)
Let patient know labs checked at last clinic visit are stable

## 2019-03-10 NOTE — Telephone Encounter (Signed)
Left message for patient to call back  

## 2019-03-11 ENCOUNTER — Telehealth: Payer: Self-pay | Admitting: Pulmonary Disease

## 2019-03-11 NOTE — Telephone Encounter (Signed)
ATC pt, no answer. Left message for pt to call back.  

## 2019-03-11 NOTE — Telephone Encounter (Signed)
Spoke with pt, she is requesting update on the OFEV. Please advise and leave detailed message letting patient know if she is unable to come to the phone.

## 2019-03-11 NOTE — Telephone Encounter (Signed)
Advised pt of results. Pt understood and nothing further is needed.   Notes recorded by Marshell Garfinkel, MD on 02/12/2019 at 10:13 AM EDT  CT shows stable findings of interstitial lung disease with no worsening compared to prior CT scan earlier this year. This is good news  Continue current therapy without change.

## 2019-03-12 NOTE — Telephone Encounter (Signed)
Called patient, left a message stating that Ofev prior authorization is still pending and we will update her once we receive a response.  8:56 AM Beatriz Chancellor, CPhT

## 2019-03-12 NOTE — Telephone Encounter (Signed)
LMOM TCB x3 Will sign off per office protocol

## 2019-03-12 NOTE — Telephone Encounter (Signed)
Received fax from [place] requesting additional information for PA.  Answered clinical questions and faxed along with supporting literature and office visit notes to [phone].  Will update when we hear a response.  Mariella Saa, PharmD, [place], [place] Clinical Specialty Pharmacist [phone]  03/12/2019 8:48 AM

## 2019-03-13 ENCOUNTER — Encounter (HOSPITAL_COMMUNITY): Payer: 59 | Admitting: Internal Medicine

## 2019-03-19 NOTE — Telephone Encounter (Signed)
Received a fax regarding Prior Authorization from [place] for Ofev '150mg'$ . Authorization has been DENIED because request does not meet plans guidelines. Plan guideline named NINTEDANIB (Ofev) requires the following rule(s) bet met: 1) You have a least 10% fibrosis (tissue scarring) on a chest high resolution computed tomography (HRCT) 2) Other causes of interstitial lung disease are ruled out.  PA Case ID: [id number]  Pharmacy team will appeal.  Phone# [phone]  Tried to call patient to give update, mailbox was full.  9:37 AM Beatriz Chancellor, CPhT

## 2019-03-20 ENCOUNTER — Other Ambulatory Visit: Payer: Self-pay

## 2019-03-20 ENCOUNTER — Encounter (HOSPITAL_COMMUNITY): Payer: Self-pay | Admitting: Internal Medicine

## 2019-03-20 ENCOUNTER — Ambulatory Visit (HOSPITAL_COMMUNITY)
Admission: RE | Admit: 2019-03-20 | Discharge: 2019-03-20 | Disposition: A | Discharge status: Home / Self Care | Payer: 59 | Source: Ambulatory Visit | Attending: Internal Medicine | Admitting: Internal Medicine

## 2019-03-20 VITALS — BP 110/84 | HR 99 | Wt 130.8 lb

## 2019-03-20 DIAGNOSIS — Z888 Allergy status to other drugs, medicaments and biological substances status: Secondary | ICD-10-CM | POA: Diagnosis not present

## 2019-03-20 DIAGNOSIS — E039 Hypothyroidism, unspecified: Secondary | ICD-10-CM | POA: Diagnosis not present

## 2019-03-20 DIAGNOSIS — K219 Gastro-esophageal reflux disease without esophagitis: Secondary | ICD-10-CM | POA: Diagnosis not present

## 2019-03-20 DIAGNOSIS — E559 Vitamin D deficiency, unspecified: Secondary | ICD-10-CM | POA: Diagnosis not present

## 2019-03-20 DIAGNOSIS — Z8249 Family history of ischemic heart disease and other diseases of the circulatory system: Secondary | ICD-10-CM | POA: Diagnosis not present

## 2019-03-20 DIAGNOSIS — J8410 Pulmonary fibrosis, unspecified: Secondary | ICD-10-CM | POA: Diagnosis not present

## 2019-03-20 DIAGNOSIS — Z833 Family history of diabetes mellitus: Secondary | ICD-10-CM | POA: Diagnosis not present

## 2019-03-20 DIAGNOSIS — M349 Systemic sclerosis, unspecified: Secondary | ICD-10-CM

## 2019-03-20 DIAGNOSIS — Z79899 Other long term (current) drug therapy: Secondary | ICD-10-CM | POA: Diagnosis not present

## 2019-03-20 DIAGNOSIS — J849 Interstitial pulmonary disease, unspecified: Secondary | ICD-10-CM

## 2019-03-20 DIAGNOSIS — I110 Hypertensive heart disease with heart failure: Secondary | ICD-10-CM | POA: Insufficient documentation

## 2019-03-20 DIAGNOSIS — I5032 Chronic diastolic (congestive) heart failure: Secondary | ICD-10-CM | POA: Diagnosis not present

## 2019-03-20 MED ORDER — FUROSEMIDE 40 MG PO TABS: ORAL_TABLET | ORAL | 5 refills | Status: DC

## 2019-03-20 NOTE — Patient Instructions (Signed)
Take Furosemide everyday EXCEPT Tue and [place]  Your physician recommends that you schedule a follow-up appointment in: 4 months  If you have any questions or concerns before your next appointment please send [place] a message through [place] or call our office at [phone].  At the [facility], you and your health needs are our priority. As part of our continuing mission to provide you with exceptional heart care, we have created designated Provider Care Teams. These Care Teams include your primary Cardiologist (physician) and Advanced Practice Providers (APPs- Physician Assistants and Nurse Practitioners) who all work together to provide you with the care you need, when you need it.   You may see any of the following providers on your designated Care Team at your next follow up: Marland Kitchen Dr Glori Bickers . Dr Loralie Champagne . Darrick Grinder, NP . Lyda Jester, PA   Please be sure to bring in all your medications bottles to every appointment.

## 2019-03-20 NOTE — Progress Notes (Signed)
Heart Failure Clinic Note  Date:  03/20/2019   ID:  Karen [name], DOB [birth year], MRN [id number]  Location: Home  Provider location: [facility] Type of Visit: Established patient  PCP:  [name], MD  Cardiologist:  Mertie Moores, MD Primary HF: Bensimhon  Chief Complaint: Heart Failure follow-up   History of Present Illness:  HPI: Karen [name] is a 48 y.o. female [place] [facility] (with Richardson [name]) with a history of Sjogren's syndrome, multinodular goiter, HTN and recently diagnosed scleroderma referred by Dr. Gavin [name] for evaluation of pulmonary HTN in the setting of scleroderma.  RHC in 5/19 with minimally elevated pressures and hi-res CT which showed ILD. Follows with Dr. Vaughan [name] . F/u CT in 2/20 showed stable ILD  We saw her in 9/20 markedly SOB and ReDS very high @ 49%. CXR with mild pulmonary vascular congestion. ESR normal. Started lasix 40 daily and kcl 20 daily. Fluid got much better. Now taking lasix 40 mg MWF. Not feeling well. Mild edema. Still with DOE. Having HAs. Tired and fatigued/   Studies:  Echo 11/18/18: EF hyperdynamic 65-70% RV normal   Echo 5/19: EF 60-65% grade I DD RV normal. Mild TR.   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 Aortic atherosclerosis, three-vessel coronary artery disease.  CT high-resolution 06/27/2018-stable interstitial lung disease. I reviewed the images personally.  Hi res CT 02/05/19: stable ILD   PFTs  FENO 09/06/2017-unable to complete  07/27/2017 FVC 1.65 (54%), FEV1 1.53 (62%], F/F 93, TLC 50, DLCO 44%, DLCO/VA 131%  10/25/2017 FVC 1.51 [50%], FEV1 1.26 [51%], F/F 83  01/29/2018 FVC 1.69 [5%), FEV1 1.50 [61%], F/F 89, DLCO  10.57 [41%]  07/01/2018 FVC 1.57 [52%), FEV1 1.47 [60%], F/F 93, DLCO 12.54 [59%)  01/06/2019 FVC 1.58 [52%], FEV1 1.46 [60%],F/F 92, TLC 2.62 [50%], DLCO 9.16 [42%] Severe restriction and diffusion impairment. FVC is stable but DLCO has worsened.  6-minute walk  10/23/2017- 144 m Post walk heart rate, stats 94, 91%  02/04/2018-249 m Post walk heart rate, sats 101, 99%  06/06/2018-212 m Post walk heart rate, sats 86, 92%  01/30/2019-187mPost walk heart rate, sats 83, 98%      Karen [name] symptoms worrisome for COVID 19.   Past Medical History:  Diagnosis Date  . GERD (gastroesophageal reflux disease)   . Hypertension   . Hypothyroidism   . ILD ([place]stitial lung disease) ([place] 02/08/2018  . Interstitial lung disease ([place]   . Multinodular thyroid    benign FNA 08/2017.  .Marland KitchenPerimenopause 02/27/2019  . Scleroderma ([place]   . Vitamin D deficiency disease 02/27/2019   Past Surgical History:  Procedure Laterality Date  . ABDOMINAL HERNIA REPAIR    . BUNIONECTOMY Bilateral 10/2018  . COLONOSCOPY WITH PROPOFOL N/A 01/02/2019   Procedure: COLONOSCOPY WITH PROPOFOL;  Surgeon: RDaneil Dolin MD;  [facility];  Service: Endoscopy;  Laterality: N/A;  12:30pm  . ESOPHAGOGASTRODUODENOSCOPY (EGD) WITH PROPOFOL N/A 01/28/2018   erosive reflux esophagitis, patulous EG [place], no dilation, incomplete EGD due to retained food in stomach. GES thereafter with delayed gastric emptying.   .Marland KitchenRIGHT HEART CATH N/A 09/27/2017   Procedure:  RIGHT HEART CATH;  Surgeon: Jolaine Artist, MD;  Location: [place] [facility] LAB;  Service: Cardiovascular;  Laterality: N/A;  . UTERINE FIBROID SURGERY       Current Outpatient Medications  Medication Sig Dispense Refill  . bisoprolol (ZEBETA) 5 MG tablet Take 1 tablet (5 mg total) by mouth daily. 30 tablet 1  . Cholecalciferol (VITAMIN D-3) 5000 units TABS Take 5,000 Units by mouth daily.     . clindamycin-benzoyl peroxide  (BENZACLIN) gel APPLY TO FACE EACH MORNING AND NIGHT TO TOLERANCE    . esomeprazole (NEXIUM) 40 MG capsule Take 40 mg by mouth 2 (two) times daily before a meal.   5  . furosemide (LASIX) 40 MG tablet Take 1 tablet (40 mg total) by mouth 3 (three) times a week. Every Mon/Wed/Fri and other days as needed 30 tablet 5  . Multiple Vitamins-Minerals (MEGA MULTIVITAMIN) POWD Take 1 Scoop by mouth daily. Mix 1 scoop in water    . mycophenolate (CELLCEPT) 500 MG tablet Take 3 tablets by mouth every morning and every evening.    . potassium chloride 20 MEQ/15ML (10%) SOLN Take 15 mLs (20 mEq total) by mouth 3 (three) times a week. Every Mon/Wed/Fri and other days when you take Furosemide 473 mL 0  . progesterone (PROMETRIUM) 200 MG capsule Take 1 capsule (200 mg total) by mouth daily. 30 capsule 3  . spironolactone (ALDACTONE) 25 MG tablet Take 1 tablet (25 mg total) by mouth daily. 30 tablet 6  . sulfamethoxazole-trimethoprim (BACTRIM DS) 800-160 MG tablet Take 1 tablet by mouth daily.    Marland Kitchen thyroid (NP THYROID) 120 MG tablet Take 120 mg by mouth daily.     No current facility-administered medications for this encounter.     Allergies:   Lisinopril   Social History:  The patient  reports that she has never smoked. She has never used smokeless tobacco. She reports that she does not drink alcohol or use drugs.   Family History:  The patient's family history includes Diabetes in her maternal grandfather, maternal uncle, and mother; Hypertension in her brother, father, and sister.   ROS:  Please see the history of present illness.   All other systems are personally reviewed and negative.   Vitals:   03/20/19 1459  BP: 110/84  Pulse: 99  SpO2: 99%  Weight: 59.3 kg (130 lb 12.8 oz)    Exam:   General:  Well appearing. No resp difficulty HEENT: normal Neck: supple. JVP 7. Carotids 2+ bilat; no bruits. No lymphadenopathy or thryomegaly appreciated. Cor: PMI nondisplaced. Regular rate & rhythm. No  rubs, gallops or murmurs. Lungs: clear Abdomen: soft, nontender, nondistended. No hepatosplenomegaly. No bruits or masses. Good bowel sounds. Extremities: no cyanosis, clubbing, rash, edema Neuro: alert & orientedx3, cranial nerves grossly intact. moves all 4 extremities w/o difficulty. Affect pleasant   Recent Labs: 01/30/2019: B Natriuretic Peptide 199.4; Hemoglobin 11.7; Platelets 270 02/27/2019: ALT 6; BUN 14; Creat 0.75; Potassium 3.8; Sodium 140; TSH 2.41  Personally reviewed   Wt Readings from Last 3 Encounters:  03/20/19 59.3 kg (130 lb 12.8 oz)  02/27/19 59.9 kg (132 lb)  01/30/19 60.6 kg (133 lb 9.6 oz)      ASSESSMENT AND PLAN:  1. Chronic diastolic HF - Volume status starting to creep back up again.  - Increase lasix to M/W/F/S/S + PRN - RHC 5/19 with minimally elevated PA pressures  - Echo 5/19: EF 60-65% grade I DD RV normal. Mild TR.  - Echo 11/18/18:  EF hyperdynamic 65-70% RV normal Grade 1 DD - Can consider repeat RHC as needed.  2. HTN - B.bp  3. Scleroderma - Followed by Dr Trudie Reed  4. Pulmonary fibrosis - High res CT chest c/w with ILD. Repeat stable 02/05/19 (Reviewed with her personally) - Followed by Dr Karen [name] - Working on getting her Ofev. Continue Cellcept  Signed, Glori Bickers, MD  03/20/2019 3:37 PM  [facility] [place] [address] [facility] [place] [address] [phone] (office) [phone] (fax)

## 2019-03-26 ENCOUNTER — Encounter: Payer: Self-pay | Admitting: Pharmacist

## 2019-03-26 NOTE — Progress Notes (Signed)
Opened in error

## 2019-03-26 NOTE — Telephone Encounter (Signed)
Faxed appeal letter to [place] for Ofev, included chart notes and HRCT. Will update when we receive a response.  Phone# [phone] Fax# [phone]  9:33 AM Beatriz Chancellor, CPhT

## 2019-03-27 ENCOUNTER — Encounter: Payer: Self-pay | Admitting: Podiatry

## 2019-03-27 ENCOUNTER — Other Ambulatory Visit: Payer: Self-pay

## 2019-03-27 ENCOUNTER — Ambulatory Visit (INDEPENDENT_AMBULATORY_CARE_PROVIDER_SITE_OTHER): Payer: 59

## 2019-03-27 ENCOUNTER — Ambulatory Visit (INDEPENDENT_AMBULATORY_CARE_PROVIDER_SITE_OTHER): Payer: 59 | Admitting: Podiatry

## 2019-03-27 DIAGNOSIS — M2041 Other hammer toe(s) (acquired), right foot: Secondary | ICD-10-CM

## 2019-03-27 DIAGNOSIS — M2042 Other hammer toe(s) (acquired), left foot: Secondary | ICD-10-CM

## 2019-03-27 DIAGNOSIS — M779 Enthesopathy, unspecified: Secondary | ICD-10-CM | POA: Diagnosis not present

## 2019-03-27 NOTE — Progress Notes (Signed)
Subjective:   Patient ID: Karen [name], female   DOB: 48 y.o.   MRN: [id number]   HPI Patient states overall doing well but states that she does have flatfeet and she is nervous about going back to work full-time as her feet still get sore neuro   ROS      Objective:  Physical Exam  Vascular status intact with patient's feet postoperatively doing very well with significant reduction of edema and no discomfort with discomfort more in the arch and posterior tib tendon secondary to flatfoot structure     Assessment:  Doing very well post operatively foot surgery bilateral with tendinitis-like condition     Plan:  H&P reviewed condition and recommended long-term orthotics and patient is scanned for customized orthotic devices.  I then went ahead and reviewed her x-rays advising her on gradual increase in activity and hopeful return to full-time work in the next 2 to 4 weeks  X-rays indicate that there is good structural alignment with bone healing occurring across all surfaces were osteotomies were made with fixation in place

## 2019-03-28 ENCOUNTER — Ambulatory Visit: Payer: 59 | Admitting: Gastroenterology

## 2019-04-02 ENCOUNTER — Ambulatory Visit: Payer: 59 | Admitting: Podiatry

## 2019-04-09 DIAGNOSIS — Z01818 Encounter for other preprocedural examination: Secondary | ICD-10-CM | POA: Diagnosis not present

## 2019-04-09 DIAGNOSIS — F54 Psychological and behavioral factors associated with disorders or diseases classified elsewhere: Secondary | ICD-10-CM | POA: Diagnosis not present

## 2019-04-09 NOTE — Telephone Encounter (Signed)
Have not heard from [place] about Appeal decision, will call to check status.  Phone# [phone] Fax# [phone]  12:59 PM Beatriz Chancellor, CPhT

## 2019-04-10 ENCOUNTER — Ambulatory Visit: Payer: 59 | Admitting: Pulmonary Disease

## 2019-04-10 DIAGNOSIS — J84112 Idiopathic pulmonary fibrosis: Secondary | ICD-10-CM | POA: Diagnosis not present

## 2019-04-10 DIAGNOSIS — Z7682 Awaiting organ transplant status: Secondary | ICD-10-CM | POA: Diagnosis not present

## 2019-04-10 DIAGNOSIS — R0602 Shortness of breath: Secondary | ICD-10-CM | POA: Diagnosis not present

## 2019-04-10 DIAGNOSIS — R918 Other nonspecific abnormal finding of lung field: Secondary | ICD-10-CM | POA: Diagnosis not present

## 2019-04-10 DIAGNOSIS — J849 Interstitial pulmonary disease, unspecified: Secondary | ICD-10-CM | POA: Diagnosis not present

## 2019-04-10 DIAGNOSIS — I2510 Atherosclerotic heart disease of native coronary artery without angina pectoris: Secondary | ICD-10-CM | POA: Diagnosis not present

## 2019-04-10 DIAGNOSIS — K224 Dyskinesia of esophagus: Secondary | ICD-10-CM | POA: Diagnosis not present

## 2019-04-10 DIAGNOSIS — K8020 Calculus of gallbladder without cholecystitis without obstruction: Secondary | ICD-10-CM | POA: Diagnosis not present

## 2019-04-10 DIAGNOSIS — E042 Nontoxic multinodular goiter: Secondary | ICD-10-CM | POA: Diagnosis not present

## 2019-04-10 DIAGNOSIS — K30 Functional dyspepsia: Secondary | ICD-10-CM | POA: Diagnosis not present

## 2019-04-10 NOTE — Telephone Encounter (Signed)
Ran test claim, copay for 1 month supply is zero.

## 2019-04-10 NOTE — Telephone Encounter (Signed)
Received notification from [facility] regarding a prior authorization for Ofev 150mg . Authorization has been APPROVED from 03/26/19 to 03/24/20.   Will send document to scan center.  Authorization # [id number] Phone # [phone]  Plan states patient must fill through [facility]. Sent message to [facility] to advise on access to medication.  11:35 AM Beatriz Chancellor, CPhT

## 2019-04-11 NOTE — Telephone Encounter (Signed)
Heard back from [place], and override has been placed and patient can fill Ofev medication at [facility].  8:02 AM Beatriz Chancellor, CPhT

## 2019-04-13 DIAGNOSIS — Z7682 Awaiting organ transplant status: Secondary | ICD-10-CM | POA: Insufficient documentation

## 2019-04-15 MED ORDER — OFEV 150 MG PO CAPS: 150.0000 mg | ORAL_CAPSULE | Freq: Two times a day (BID) | ORAL | 2 refills | Status: DC

## 2019-04-15 NOTE — Telephone Encounter (Signed)
Called to notify patient of approval.  No answer.  Left voicemail ans requested a return call.  Prescription sent to [facility].  Future lab order placed and due 1 month after starting Ofev.   Mariella Saa, PharmD, [place], [place] [facility] Pharmacist [phone]  04/15/2019 2:22 PM

## 2019-04-24 ENCOUNTER — Ambulatory Visit (INDEPENDENT_AMBULATORY_CARE_PROVIDER_SITE_OTHER): Payer: 59 | Admitting: Pulmonary Disease

## 2019-04-24 ENCOUNTER — Encounter: Payer: Self-pay | Admitting: Pulmonary Disease

## 2019-04-24 ENCOUNTER — Other Ambulatory Visit: Payer: 59 | Admitting: Orthotics

## 2019-04-24 ENCOUNTER — Telehealth: Payer: Self-pay | Admitting: Podiatry

## 2019-04-24 ENCOUNTER — Other Ambulatory Visit: Payer: Self-pay

## 2019-04-24 ENCOUNTER — Encounter (HOSPITAL_COMMUNITY): Payer: Self-pay | Admitting: *Deleted

## 2019-04-24 VITALS — BP 118/68 | HR 90 | Temp 98.0°F | Ht 64.0 in | Wt 129.8 lb

## 2019-04-24 DIAGNOSIS — M349 Systemic sclerosis, unspecified: Secondary | ICD-10-CM | POA: Diagnosis not present

## 2019-04-24 DIAGNOSIS — J849 Interstitial pulmonary disease, unspecified: Secondary | ICD-10-CM | POA: Diagnosis not present

## 2019-04-24 DIAGNOSIS — Z5181 Encounter for therapeutic drug level monitoring: Secondary | ICD-10-CM | POA: Diagnosis not present

## 2019-04-24 LAB — CBC WITH DIFFERENTIAL/PLATELET
Basophils Absolute: 0 10*3/uL (ref 0.0–0.1)
Basophils Relative: 0.6 % (ref 0.0–3.0)
Eosinophils Absolute: 0.2 10*3/uL (ref 0.0–0.7)
Eosinophils Relative: 2 % (ref 0.0–5.0)
HCT: 36.5 % (ref 36.0–46.0)
Hemoglobin: 11.7 g/dL — ABNORMAL LOW (ref 12.0–15.0)
Lymphocytes Relative: 28.1 % (ref 12.0–46.0)
Lymphs Abs: 2.3 10*3/uL (ref 0.7–4.0)
MCHC: 32.1 g/dL (ref 30.0–36.0)
MCV: 85 fl (ref 78.0–100.0)
Monocytes Absolute: 1 10*3/uL (ref 0.1–1.0)
Monocytes Relative: 11.5 % (ref 3.0–12.0)
Neutro Abs: 4.8 10*3/uL (ref 1.4–7.7)
Neutrophils Relative %: 57.8 % (ref 43.0–77.0)
Platelets: 245 10*3/uL (ref 150.0–400.0)
RBC: 4.29 Mil/uL (ref 3.87–5.11)
RDW: 14.7 % (ref 11.5–15.5)
WBC: 8.3 10*3/uL (ref 4.0–10.5)

## 2019-04-24 LAB — COMPREHENSIVE METABOLIC PANEL
ALT: 11 U/L (ref 0–35)
AST: 18 U/L (ref 0–37)
Albumin: 4.2 g/dL (ref 3.5–5.2)
Alkaline Phosphatase: 74 U/L (ref 39–117)
BUN: 14 mg/dL (ref 6–23)
CO2: 28 mEq/L (ref 19–32)
Calcium: 10.6 mg/dL — ABNORMAL HIGH (ref 8.4–10.5)
Chloride: 100 mEq/L (ref 96–112)
Creatinine, Ser: 0.61 mg/dL (ref 0.40–1.20)
GFR: 125.14 mL/min (ref >60.00)
Glucose, Bld: 90 mg/dL (ref 70–99)
Potassium: 3.4 mEq/L — ABNORMAL LOW (ref 3.5–5.1)
Sodium: 136 mEq/L (ref 135–145)
Total Bilirubin: 0.4 mg/dL (ref 0.2–1.2)
Total Protein: 7.9 g/dL (ref 6.0–8.3)

## 2019-04-24 NOTE — Telephone Encounter (Signed)
Pt left message yesterday and 1 this morning about her appt scheduled for today. Orthotics not in.  I called pt back and voicemail is full but it looks like pt has been r/s for 1.4.2021.Marland KitchenMarland KitchenMarland Kitchen

## 2019-04-24 NOTE — Patient Instructions (Addendum)
We will get some labs today including comprehensive metabolic panel and CBC Start Ofev Continue CellCept and Bactrim Refer to pulmonary rehab  We will refer you to pharmacy for medication management Schedule high-resolution CT in February 2020 Follow-up after CT

## 2019-04-24 NOTE — Progress Notes (Signed)
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 Haroldine Laws on 09/27/17 with no evidence of pulmonary hypertension Started on CellCept May 2019 and uptitrated to max dose of 1.5 mg twice daily.  She had a hospitalization in [facility] in March 2019 for hypertension.  A CT chest scan at that time showed subtle lower lung findings of atelectasis, groundglass.  She has a right thyroid nodule which was biopsied on 08/15/17 with pathology showing benign findings.  Thyroid function tests are normal.  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  Reviewed note from Dr. Trudie Reed, rheumatology 07/12/2017 Seen for joint pain, Raynaud's, elevated CK, skin thickening Serologies positive for Sjogren's negative,, normal CK and aldolase.  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  No mold. No hot tub, Jacuzzi. She has down comforter.  Smoking history: Never smoker Travel history: Grew up in [place].  Lived in [place] and [place] Relevant family history: No family history of lung disease.    Interim history: Continues on CellCept 1.5 mg twice daily.   States that breathing is stable.  She does note increased cough and chest tightness at night She is return to work full-time.  We had tried to get her on Ofev but has been denied thrice by insurance company.  She is currently working through [employer] cares program to get patient assistance for therapy.    Outpatient Encounter Medications as of 04/24/2019  Medication Sig  . bisoprolol (ZEBETA) 5 MG tablet Take 1 tablet (5 mg total) by mouth daily.  . Cholecalciferol (VITAMIN D-3) 5000 units TABS Take 5,000 Units by mouth daily.   . clindamycin-benzoyl peroxide (BENZACLIN) gel APPLY TO FACE EACH MORNING AND NIGHT TO TOLERANCE  . esomeprazole (NEXIUM) 40 MG capsule Take 40 mg by mouth 2 (two) times daily before a meal.   . furosemide (LASIX) 40 MG tablet Take every day except Tue and Thur (Patient taking differently: Take every day except Saturday and Sunday)  . Multiple Vitamins-Minerals (MEGA MULTIVITAMIN) POWD Take 1 Scoop by mouth daily. Mix 1 scoop in water  . mycophenolate (CELLCEPT) 500 MG tablet Take 3 tablets by mouth every morning and every evening.  . Nintedanib (OFEV) 150 MG CAPS Take 1 capsule (150 mg total) by mouth 2 (two) times daily.  . potassium chloride 20 MEQ/15ML (10%) SOLN Take 15 mLs (20 mEq total) by mouth 3 (three) times a week. Every Mon/Wed/Fri and other days when you take Furosemide  . progesterone (PROMETRIUM) 200 MG capsule Take 1 capsule (200 mg total) by mouth daily.  Marland Kitchen spironolactone (ALDACTONE) 25 MG tablet Take 1 tablet (25 mg total) by mouth daily.  Marland Kitchen sulfamethoxazole-trimethoprim (BACTRIM DS) 800-160 MG tablet Take 1 tablet by mouth daily.  Marland Kitchen thyroid (NP THYROID) 120 MG tablet Take 120 mg by mouth daily.   No facility-administered encounter medications on file as of 04/24/2019.    Physical Exam: Blood pressure 110/70, pulse 74, temperature 97.7 F (36.5 C), temperature source Temporal, height  5\' 5"  (1.651 m), weight 132 lb 9.6 oz (60.1 kg), SpO2 99 %. Gen:      No acute distress HEENT:  EOMI, sclera anicteric Neck:     No masses; no thyromegaly Lungs:    Basal crackles. CV:         Regular rate and rhythm; no murmurs Abd:      + bowel sounds; soft, non-tender; no palpable masses, no distension Ext:    No edema; adequate peripheral perfusion Skin:       Warm and dry; no rash Neuro: alert and oriented x 3 Psych: normal mood and affect  Data Reviewed: Imaging CT chest, [facility] 07/24/2017 Dependent atelectasis, subtle groundglass opacities at the base.  2.5 cm right thyroid nodule.  I have reviewed the images personally  CT high-resolution 09/18/2017- patchy reticular groundglass opacities, reticulation with subpleural sparing.  Mild traction bronchiectasis.  No honeycombing.  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  Aortic atherosclerosis, three-vessel coronary artery disease.  CT high-resolution 06/27/2018-stable interstitial lung disease. I reviewed the images personally.  PFTs FENO 09/06/2017-unable to complete  07/27/2017 FVC 1.65 (54%), FEV1 1.53 (62%], F/F 93, TLC 50, DLCO 44%, DLCO/VA 131%  10/25/2017 FVC 1.51 [50%], FEV1 1.26 [51%], F/F 83  01/29/2018 FVC 1.69 [5%), FEV1 1.50 [61%], F/F 89, DLCO 10.57 [41%]  07/01/2018 FVC 1.57 [52%), FEV1 1.47 [60%], F/F 93, DLCO 12.54 [59%)  01/06/2019 FVC 1.58 [52%], FEV1 1.46 [60%], F/F 92, TLC 2.62 [50%], DLCO 9.16 [42%] Severe restriction and diffusion impairment.  FVC is stable but DLCO has worsened.  6-minute walk  10/23/2017- 144 m Post walk heart rate, stats 94, 91%  02/04/2018- 249 m Post walk heart rate, sats 101, 99%  06/06/2018-212 m Post walk heart rate, sats 86, 92%  01/30/2019-130m Post walk heart rate, sats 83, 98%  Labs 08/20/2017-ANA, centromere, rheumatoid factor, SCL 70-negative QuantiFERON 09/12/2017-negative Hepatitis B, C screening 09/21/2017-negative G6PD 09/25/2017-13  Labs from Dr. Trudie Reed 07/12/17 Rheumatoid factor < 10, CCP-8, ANCA-negative, double-stranded DNA-negative, Smith antibody-negative, SCL 70-negative, RNP-negative SSA 5.8, SSB 1.5 Smith antibody-negative, ANA IFA-negative Myositis panel-negative CK 165, C4-30, C3-168, total complement greater than 60, aldolase  8.8 Sed rate 9, CRP 0.9  CBC, hepatic function panel 06/17/2018-within normal limits.  Cardiac Cardiac cath 09/27/17 RA = 2 RV = 33/6 PA = 32/10 (19) PCW = 6 Fick cardiac output/index = 5.0/3.0 PVR = 2.6 Ao sat = 99% PA sat = 74%, 75%  Assessment:  Scleroderma ILD Continue on CellCept 1.5 g twice daily, Bactrim prophylaxis.  Check CBC and CMP today Clinically she is stable but PFTs show reduction in DLCO and there is reduction in 6-minute walk test Awaiting high-resolution CT for reevaluation of ILD No evidence of pulmonary hypertension on right heart catheterization in the past. Did not desat today on exertion.  Will check overnight oximetry  We have been trying to get her approved for Ofev as she will benefit from anti-fibrotic based on SENSICUS trial.  Https://www.nejm.org/doi/10.1056/NEJMoa1903076 Will work with going [place] to get this approved as soon as possible.  She has been referred to lung transplant at [facility] but clinic visit has been on hold on hold due to foot surgery.  We will make a referral again  Esophageal dysfunction, esophageal stricture, GERD Continue on Protonix 20 mg twice daily Follows with [facility] gastroenterology  Plan/Recommendations: - Continue CellCept at current dose of 1.5 twice daily. - CBC, CMP - High-resolution CT, overnight oximetry -  Referral to [facility] transplant - Bactrim prophylaxis. - Paperwork for Mertie Clause MD [place] [facility] 04/24/2019, 1:53 PM   Addendum Overnight oximetry 01/30/2019- test time 8 hours 25 minutes Lowest O2 sat 87%.  Mean O2 sat 95%.  Time spent less than 88% - 1 minute 30 seconds No significant desaturation.  Does not need supplemental oxygen.  Marshell Garfinkel MD [place] [facility] 04/24/2019, 1:53 PM

## 2019-04-24 NOTE — Progress Notes (Signed)
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 Haroldine Laws on 09/27/17 with no evidence of pulmonary hypertension Started on CellCept May 2019 and uptitrated to max dose of 1.5 mg twice daily.  She had a hospitalization in [facility] in March 2019 for hypertension.  A CT chest scan at that time showed subtle lower lung findings of atelectasis, groundglass.  She has a right thyroid nodule which was biopsied on 08/15/17 with pathology showing benign findings.  Thyroid function tests are normal.  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  Reviewed note from Dr. Trudie Reed, rheumatology 07/12/2017 Seen for joint pain, Raynaud's, elevated CK, skin thickening Serologies positive for Sjogren's negative,, normal CK and aldolase.  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  No mold. No hot tub, Jacuzzi. She has down comforter.  Smoking history: Never smoker Travel history: Grew up in [place].  Lived in [place] and [place] Relevant family history: No family history of lung disease.    Interim history: Continues on CellCept 1.5 mg twice daily.   States that breathing is stable.  She does note increased cough and chest tightness at night She is return to work full-time.  Has been evaluated at [facility] transplant have reviewed her case and are going to discuss at multidisciplinary conference.  They note that she is deconditioned and have recommended  pulmonary rehab  Outpatient Encounter Medications as of 04/24/2019  Medication Sig  . bisoprolol (ZEBETA) 5 MG tablet Take 1 tablet (5 mg total) by mouth daily.  . Cholecalciferol (VITAMIN D-3) 5000 units TABS Take 5,000 Units by mouth daily.   . clindamycin-benzoyl peroxide (BENZACLIN) gel APPLY TO FACE EACH MORNING AND NIGHT TO TOLERANCE  . esomeprazole (NEXIUM) 40 MG capsule Take 40 mg by mouth 2 (two) times daily before a meal.   . furosemide (LASIX) 40 MG tablet Take every day except Tue and Thur (Patient taking differently: Take every day except Saturday and Sunday)  . Multiple Vitamins-Minerals (MEGA MULTIVITAMIN) POWD Take 1 Scoop by mouth daily. Mix 1 scoop in water  . mycophenolate (CELLCEPT) 500 MG tablet Take 3 tablets by mouth every morning and every evening.  . Nintedanib (OFEV) 150 MG CAPS Take 1 capsule (150 mg total) by mouth 2 (two) times daily.  . potassium chloride 20 MEQ/15ML (10%) SOLN Take 15 mLs (20 mEq total) by mouth 3 (three) times a week. Every Mon/Wed/Fri and other days when you take Furosemide  . progesterone (PROMETRIUM) 200 MG capsule Take 1 capsule (200 mg total) by mouth daily.  Marland Kitchen spironolactone (ALDACTONE) 25 MG tablet Take 1 tablet (25 mg total) by mouth daily.  Marland Kitchen sulfamethoxazole-trimethoprim (BACTRIM DS) 800-160 MG tablet Take 1 tablet by mouth daily.  Marland Kitchen thyroid (NP THYROID) 120 MG tablet Take 120 mg by mouth daily.   No facility-administered encounter medications on file as of 04/24/2019.    Physical Exam: Blood pressure 118/68, pulse 90, temperature 98 F (36.7 C), temperature source Temporal, height 5\' 4"  (  1.626 m), weight 129 lb 12.8 oz (58.9 kg), SpO2 100 %. Gen:      No acute distress HEENT:  EOMI, sclera anicteric Neck:     No masses; no thyromegaly Lungs:    Clear to auscultation bilaterally; normal respiratory effort CV:         Regular rate and rhythm; no murmurs Abd:      + bowel sounds; soft, non-tender; no palpable masses, no  distension Ext:    No edema; adequate peripheral perfusion Skin:      Warm and dry; no rash Neuro: alert and oriented x 3 Psych: normal mood and affect  Data Reviewed: Imaging CT chest, [facility] 07/24/2017 Dependent atelectasis, subtle groundglass opacities at the base.  2.5 cm right thyroid nodule.  I have reviewed the images personally  CT high-resolution 09/18/2017- patchy reticular groundglass opacities, reticulation with subpleural sparing.  Mild traction bronchiectasis.  No honeycombing.  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  Aortic atherosclerosis, three-vessel coronary artery disease.  CT high-resolution 06/27/2018-stable interstitial lung disease. I reviewed the images personally.  PFTs FENO 09/06/2017-unable to complete  07/27/2017 FVC 1.65 (54%), FEV1 1.53 (62%], F/F 93, TLC 50, DLCO 44%, DLCO/VA 131%  10/25/2017 FVC 1.51 [50%], FEV1 1.26 [51%], F/F 83  01/29/2018 FVC 1.69 [5%), FEV1 1.50 [61%], F/F 89, DLCO 10.57 [41%]  07/01/2018 FVC 1.57 [52%), FEV1 1.47 [60%], F/F 93, DLCO 12.54 [59%)  01/06/2019 FVC 1.58 [52%], FEV1 1.46 [60%], F/F 92, TLC 2.62 [50%], DLCO 9.16 [42%] Severe restriction and diffusion impairment.  FVC is stable but DLCO has worsened.  6-minute walk  10/23/2017- 144 m Post walk heart rate, stats 94, 91%  02/04/2018- 249 m Post walk heart rate, sats 101, 99%  06/06/2018-212 m Post walk heart rate, sats 86, 92%  01/30/2019-140m Post walk heart rate, sats 83, 98%  Labs 08/20/2017-ANA, centromere, rheumatoid factor, SCL 70-negative QuantiFERON 09/12/2017-negative Hepatitis B, C screening 09/21/2017-negative G6PD 09/25/2017-13  Labs from Dr. Trudie Reed 07/12/17 Rheumatoid factor < 10, CCP-8, ANCA-negative, double-stranded DNA-negative, Smith antibody-negative, SCL 70-negative, RNP-negative SSA 5.8, SSB 1.5 Smith antibody-negative, ANA IFA-negative Myositis  panel-negative CK 165, C4-30, C3-168, total complement greater than 60, aldolase 8.8 Sed rate 9, CRP 0.9  CBC, hepatic function panel 06/17/2018-within normal limits.  Cardiac Cardiac cath 09/27/17 RA = 2 RV = 33/6 PA = 32/10 (19) PCW = 6 Fick cardiac output/index = 5.0/3.0 PVR = 2.6 Ao sat = 99% PA sat = 74%, 75%  Overnight oximetry 01/30/2019- test time 8 hours 25 minutes Lowest O2 sat 87%.  Mean O2 sat 95%.  Time spent less than 88% - 1 minute 30 seconds No significant desaturation.  Does not need supplemental oxygen.  Assessment:  Scleroderma ILD Continue on CellCept 1.5 g twice daily, Bactrim prophylaxis.  Check CBC and CMP today Clinically she is stable but PFTs show reduction in DLCO and there is reduction in 6-minute walk test No evidence of pulmonary hypertension on right heart catheterization in the past.  Continues on CellCept.  We have gotten Ofev approved after multiple appeals She will start the medication this week.  Check CBC and CMP for baseline assessment  Refer to pulmonary rehab  Esophageal dysfunction, esophageal stricture, GERD Continue on Protonix 20 mg twice daily Follows with [facility] gastroenterology  Plan/Recommendations: - Continue CellCept at current dose of 1.5 twice daily. - Start Ofev - CBC, CMP - Bactrim prophylaxis. - Pulmonary rehab  Marshell Garfinkel MD [place] [facility] 04/24/2019, 1:51 PM

## 2019-04-24 NOTE — Progress Notes (Signed)
Received referral from Dr. Vaughan Browner for this pt to participate in pulmonary rehab with the the diagnosis of ILD. Clinical review of pt follow up appt on 12/17.  Although she lives in the [place] area she works for [employer] on [address] here in [place].  She works 4 ten hour shifts and typically has Thursdays off.  Pulmonary office note.  Pt with Covid Risk Score - 3.Pt appropriate for scheduling for Pulmonary rehab. Because of her work schedule she would be interested in participating virtually.  Checklist:  1. Pt has smart device  ie smartphone and/or ipad for downloading an app  Yes 2. Reliable internet/wifi service    Yes 3. Understands how to use their smartphone and navigate within an app.  Yes   Pt verbalized understanding and is in agreement.  Will forward to support staff for scheduling and verification of insurance eligibility/benefits with pt consent. Cherre Huger, BSN Cardiac and Training and development officer

## 2019-04-29 ENCOUNTER — Telehealth (HOSPITAL_COMMUNITY): Payer: Self-pay

## 2019-04-29 NOTE — Telephone Encounter (Signed)
Pt insurance is active and benefits verified through [facility]. Co-pay $0.00, DED $300.00/$300.00 met, out of pocket $7,900.00/$7,900.00 met, co-insurance 20%. No pre-authorization required. Anthony/[facility], 04/29/2019 @ 2:21PM, BTC#[id number]

## 2019-05-01 MED FILL — BISOPROLOL FUMARATE 5 MG TA: 5 | 30 days supply | Qty: 30 | Fill #0

## 2019-05-12 ENCOUNTER — Other Ambulatory Visit: Payer: 59 | Admitting: Orthotics

## 2019-05-22 ENCOUNTER — Encounter: Payer: Self-pay | Admitting: Nurse Practitioner

## 2019-05-22 ENCOUNTER — Ambulatory Visit (INDEPENDENT_AMBULATORY_CARE_PROVIDER_SITE_OTHER): Payer: 59 | Admitting: Nurse Practitioner

## 2019-05-22 ENCOUNTER — Other Ambulatory Visit: Payer: Self-pay

## 2019-05-22 VITALS — BP 148/105 | HR 98 | Temp 97.5°F | Ht 64.0 in | Wt 130.4 lb

## 2019-05-22 DIAGNOSIS — K219 Gastro-esophageal reflux disease without esophagitis: Secondary | ICD-10-CM

## 2019-05-22 DIAGNOSIS — K3184 Gastroparesis: Secondary | ICD-10-CM

## 2019-05-22 MED ORDER — LIDOCAINE VISCOUS HCL 2 % MT SOLN: 15.0000 mL | Freq: Four times a day (QID) | OROMUCOSAL | 3 refills | Status: DC | PRN

## 2019-05-22 MED FILL — LIDOCAINE 2% VISCOUS SOLN: 2 | 2 days supply | Qty: 100 | Fill #0

## 2019-05-22 NOTE — Assessment & Plan Note (Signed)
History of known gastroparesis on Reglan 5 mg twice daily.  No overt side effects from Reglan.  This manages her gastroparesis symptoms relatively well but she still does have some symptoms of early satiety and nausea.  This is to be expected.  I reinforced instructions for trying to eat smaller more frequent meals which she does make intensity.  She also avoids greasy and fatty foods which I congratulated her on is still help prevent worsening gastroparesis.  Continue current medications and return for follow-up in 6 months.  Call for any worsening or severe symptoms.

## 2019-05-22 NOTE — Patient Instructions (Signed)
Your health issues we discussed today were:   Gastroparesis (slow emptying stomach): 1. Continue taking Reglan 5 mg twice daily 2. Call us if you have any worsening or severe symptoms  GERD (reflux/heartburn): 1. Continue taking Nexium twice daily as you have been 2. I have sent a prescription for viscous lidocaine.  You can take 15 mL up to every 6 hours as needed for breakthrough GERD/reflux symptoms 3. You can use a straw to try to get the medication to the back your throat rather than numbing your tongue 4. Call us if you have any worsening or severe symptoms  Overall I recommend:  1. We will give you the phone number for radiology scheduling in order to schedule your CT of your chest 2. Return for follow-up in 6 months 3. Call us if you have any questions or concerns 4. Continue your other medications otherwise.   Because of recent events of COVID-19 ("Coronavirus"), follow CDC recommendations:  Wash your hand frequently Avoid touching your face Stay away from people who are sick If you have symptoms such as fever, cough, shortness of breath then call your healthcare provider for further guidance If you are sick, STAY AT HOME unless otherwise directed by your healthcare provider. Follow directions from state and national officials regarding staying safe   At [facility] we value your feedback. You may receive a survey about your visit today. Please share your experience as we strive to create trusting relationships with our patients to provide genuine, compassionate, quality care.  We appreciate your understanding and patience as we review any laboratory studies, imaging, and other diagnostic tests that are ordered as we care for you. Our office policy is 5 business days for review of these results, and any emergent or urgent results are addressed in a timely manner for your best interest. If you do not hear from our office in 1 week, please contact us.   We also  encourage the use of MyChart, which contains your medical information for your review as well. If you are not enrolled in this feature, an access code is on this after visit summary for your convenience. Thank you for allowing [place] to be involved in your care.  It was great to see you today!  I hope you have a Happy New Year!!

## 2019-05-22 NOTE — Progress Notes (Signed)
Referring Provider: [name], MD Primary Care Physician:  [name], MD Primary GI:  Dr. [name]  Chief Complaint  Patient presents with  . Gastroesophageal Reflux    occ; has some coughing    HPI:   Karen [name] is a 49 y.o. female who presents for follow-up. The patient was last seen in our office 10/11/2018 for gastroparesis, GERD, Constipation, and to schedule a colonoscopy. The visit was a virtual office visit due to COVID-19/Coronavirus pandemic. Noted history of chronic lung disease, GERD, dysphagia, and gastroparesis (diagnosed September 2019). Last EGD in September 2019 withe rosive esophagitis, patulous EG junction; no dilation and limited exam due to retained food in the stomach.  CT 09/05/18 remarkable for questionable mild inflammatory changes of rectosigmoid junction/rectum representing possible diverticulitis or proctitis, cholelithiasis, enlarged fibroid uterus with local mass effect on sigmoid colon/rectum and urinary bladder, physiologic cystic changes of adnexa, and redemonstration of left-sided nonobstructive nephrolithiasis.Patient was empirically treated for diverticulitis with ciprofloxacin and metronidazole for 7 days.   At her last visit she felt improved, RLQ pain resolved. Persistent fullness improved with Reglan in the morning but worsening later in the day with no Reglan ADEs. Eats at least 2 full meals a day and most of the time 3 meals a day. Tried smaller meals but was losing weight. Occasional reflux and nausea which helped with Zofran which she takes about once a week. Rare vomiting. Weight stable. Screening colonoscopy overdue. Recommended increase Reglan to 5 mg bid, continued Zofran and Nexium, MiraLAX daily for a week and call if no improvement. Plan colonoscopy in August.  Colonoscopy was scheduled for 12/30/18 was completed 01/02/2019 and found entirely normal colon, no specimens collected.  Recommended repeat colonoscopy in 10 years  (2030).  Today she states she's doing ok overall. Gastroparesis is about the same, early satiety with nausea and eventually clears up. Reglan helped. Some occasional GERD symptoms with coughing. Currently on Nexium bid. Doesn't take anything when she has a GERD flare. Nexium controls symptoms the majority of the time. GERD flares especially when she lays flat. Still with swallowing difficulties at times. Has a persistent cough that seems to be getting worse. She has mentioned the cough to her Pulmonologist in [place] and he said they'd schedule a test. Denies abdominal pain, hematochezia, melena, fever, chills, unintentional weight loss. Baseline intermittent dyspnea. Intermittent/rare dizziness/lightheadedness which self resolves. Denies syncope, near syncope. Denies any other upper or lower GI symptoms.  Past Medical History:  Diagnosis Date  . GERD (gastroesophageal reflux disease)   . Hypertension   . Hypothyroidism   . ILD (interstitial lung disease) ([place]) 02/08/2018  . Interstitial lung disease ([place])   . Multinodular thyroid    benign FNA 08/2017.  Marland Kitchen Perimenopause 02/27/2019  . Scleroderma ([place])   . Vitamin D deficiency disease 02/27/2019    Past Surgical History:  Procedure Laterality Date  . ABDOMINAL HERNIA REPAIR    . BUNIONECTOMY Bilateral 10/2018  . COLONOSCOPY WITH PROPOFOL N/A 01/02/2019   Procedure: COLONOSCOPY WITH PROPOFOL;  Surgeon: Daneil Dolin, MD;  Location: [facility];  Service: Endoscopy;  Laterality: N/A;  12:30pm  . ESOPHAGOGASTRODUODENOSCOPY (EGD) WITH PROPOFOL N/A 01/28/2018   erosive reflux esophagitis, patulous EG Junction, no dilation, incomplete EGD due to retained food in stomach. GES thereafter with delayed gastric emptying.   Marland Kitchen RIGHT HEART CATH N/A 09/27/2017   Procedure: RIGHT HEART CATH;  Surgeon: Jolaine Artist, MD;  Location: [facility];  Service: Cardiovascular;  Laterality: N/A;  . UTERINE FIBROID SURGERY      Current Outpatient  Medications  Medication Sig Dispense Refill  . bisoprolol (ZEBETA) 5 MG tablet Take 1 tablet (5 mg total) by mouth daily. 30 tablet 1  . Cholecalciferol (VITAMIN D-3) 5000 units TABS Take 5,000 Units by mouth daily.     . clindamycin-benzoyl peroxide (BENZACLIN) gel as needed.     Marland Kitchen esomeprazole (NEXIUM) 40 MG capsule Take 40 mg by mouth 2 (two) times daily before a meal.   5  . furosemide (LASIX) 40 MG tablet Take every day except Tue and Thur (Patient taking differently: Take every day except Saturday and Sunday) 30 tablet 5  . Multiple Vitamins-Minerals (MEGA MULTIVITAMIN) POWD Take 1 Scoop by mouth daily. Mix 1 scoop in water    . mycophenolate (CELLCEPT) 500 MG tablet Take 3 tablets by mouth every morning and every evening.    . Nintedanib (OFEV) 150 MG CAPS Take 1 capsule (150 mg total) by mouth 2 (two) times daily. 60 capsule 2  . potassium chloride 20 MEQ/15ML (10%) SOLN Take 15 mLs (20 mEq total) by mouth 3 (three) times a week. Every Mon/Wed/Fri and other days when you take Furosemide 473 mL 0  . spironolactone (ALDACTONE) 25 MG tablet Take 1 tablet (25 mg total) by mouth daily. 30 tablet 6  . sulfamethoxazole-trimethoprim (BACTRIM DS) 800-160 MG tablet Take 1 tablet by mouth daily.    Marland Kitchen thyroid (NP THYROID) 120 MG tablet Take 120 mg by mouth daily.     No current facility-administered medications for this visit.    Allergies as of 05/22/2019 - Review Complete 05/22/2019  Allergen Reaction Noted  . Lisinopril Hives and Swelling 08/13/2015    Family History  Problem Relation Age of Onset  . Hypertension Father   . Hypertension Sister   . Hypertension Brother   . Diabetes Maternal Grandfather   . Diabetes Maternal Uncle   . Diabetes Mother   . Colon cancer Neg Hx     Social History   Socioeconomic History  . Marital status: Single    Spouse name: Not on file  . Number of children: 0  . Years of education: Not on file  . Highest education level: Associate degree:  occupational, Hotel manager, or vocational program  Occupational History  . Occupation: CMA    Employer: [employer]  Tobacco Use  . Smoking status: Never Smoker  . Smokeless tobacco: Never Used  Substance and Sexual Activity  . Alcohol use: No  . Drug use: No  . Sexual activity: Yes  Other Topics Concern  . Not on file  Social History Narrative   Patient is right-handed. She lives alone in one level home, a few steps to enter.CMA [employer].   Social Determinants of Health   Financial Resource Strain:   . Difficulty of Paying Living Expenses: Not on file  Food Insecurity:   . Worried About Charity fundraiser in the Last Year: Not on file  . Ran Out of Food in the Last Year: Not on file  Transportation Needs:   . Lack of Transportation (Medical): Not on file  . Lack of Transportation (Non-Medical): Not on file  Physical Activity:   . Days of Exercise per Week: Not on file  . Minutes of Exercise per Session: Not on file  Stress:   . Feeling of Stress : Not on file  Social Connections:   . Frequency of Communication with Friends and Family: Not on  file  . Frequency of Social Gatherings with Friends and Family: Not on file  . Attends Religious Services: Not on file  . Active Member of Clubs or Organizations: Not on file  . Attends Archivist Meetings: Not on file  . Marital Status: Not on file    Review of Systems: General: Negative for anorexia, weight loss, fever, chills, fatigue, weakness. ENT: Negative for hoarseness, difficulty swallowing. CV: Negative for chest pain, angina, palpitations, peripheral edema.  Respiratory: Negative for dyspnea at rest, cough, sputum, wheezing.  GI: See history of present illness. Endo: Negative for unusual weight change.  Heme: Negative for bruising or bleeding. Allergy: Negative for rash or hives.   Physical Exam: BP (!) 148/105   Pulse 98   Temp (!) 97.5 F (36.4 C) (Temporal)   Ht 5\' 4"  (1.626 m)    Wt 130 lb 6.4 oz (59.1 kg)   BMI 22.38 kg/m  General:   Alert and oriented. Pleasant and cooperative. Well-nourished and well-developed.  Eyes:  Without icterus, sclera clear and conjunctiva pink.  Ears:  Normal auditory acuity. Cardiovascular:  S1, S2 present without murmurs appreciated. Extremities without clubbing or edema. Respiratory:  Clear to auscultation bilaterally. No wheezes, rales, or rhonchi. No distress.  Gastrointestinal:  +BS, soft, non-tender and non-distended. No HSM noted. No guarding or rebound. No masses appreciated.  Rectal:  Deferred  Musculoskalatal:  Symmetrical without gross deformities. Neurologic:  Alert and oriented x4;  grossly normal neurologically. Psych:  Alert and cooperative. Normal mood and affect. Heme/Lymph/Immune: No excessive bruising noted.    05/22/2019 2:35 PM   Disclaimer: This note was dictated with voice recognition software. Similar sounding words can inadvertently be transcribed and may not be corrected upon review.

## 2019-05-22 NOTE — Assessment & Plan Note (Signed)
GERD overall well managed on Nexium twice daily.  She does have occasional breakthrough and does not take anything at this time.  This is likely related to her gastroparesis and persistent fluid retention.  Symptoms typically after she lies down.  I will send in a prescription for viscous lidocaine that she can use as needed.  She was previously taking Tums but developed kidney stones on this.  Return for follow-up in 6 months, continue Nexium, call us for any worsening or severe symptoms.

## 2019-05-23 NOTE — Progress Notes (Signed)
Cc'ed to pcp °

## 2019-05-27 ENCOUNTER — Ambulatory Visit: Payer: 59 | Admitting: Orthotics

## 2019-05-27 ENCOUNTER — Other Ambulatory Visit: Payer: Self-pay

## 2019-05-27 DIAGNOSIS — M779 Enthesopathy, unspecified: Secondary | ICD-10-CM

## 2019-05-27 DIAGNOSIS — M2042 Other hammer toe(s) (acquired), left foot: Secondary | ICD-10-CM

## 2019-05-27 DIAGNOSIS — R609 Edema, unspecified: Secondary | ICD-10-CM

## 2019-05-27 DIAGNOSIS — M2041 Other hammer toe(s) (acquired), right foot: Secondary | ICD-10-CM

## 2019-05-27 NOTE — Progress Notes (Signed)
Patient came in today to pick up custom made foot orthotics.  The goals were accomplished and the patient reported no dissatisfaction with said orthotics.  Patient was advised of breakin period and how to report any issues. 

## 2019-05-28 ENCOUNTER — Telehealth: Payer: Self-pay | Admitting: *Deleted

## 2019-05-28 ENCOUNTER — Other Ambulatory Visit: Payer: Self-pay | Admitting: Gastroenterology

## 2019-05-28 DIAGNOSIS — K3184 Gastroparesis: Secondary | ICD-10-CM

## 2019-05-28 MED ORDER — METOCLOPRAMIDE HCL 5 MG PO TABS: 5.0000 mg | ORAL_TABLET | Freq: Every day | ORAL | 5 refills | Status: DC

## 2019-05-28 MED ORDER — ONDANSETRON HCL 4 MG PO TABS: 4.0000 mg | ORAL_TABLET | Freq: Three times a day (TID) | ORAL | 1 refills | Status: DC | PRN

## 2019-05-28 MED FILL — ONDANSETRON HCL 4 MG TABLET: 4 | 10 days supply | Qty: 30 | Fill #0

## 2019-05-28 NOTE — Telephone Encounter (Signed)
Pt called in and said she saw EG on 05/22/19.   She said that she still feels bad.  She said that she is having stomach spasms, headaches, and vomiting.  She said that she has not picked up her RX for viscous lidocaine because she has been so sick.  Routing to [facility] in EG's absence.

## 2019-05-28 NOTE — Telephone Encounter (Signed)
Spoke with patient.  Nausea and abdominal pain started Saturday night.  Sunday she started with vomiting and constant abdominal pain.  Symptoms are improving.  Today she has intermittent stomach spasms, nausea, but no vomiting.  States this is a typical gastroparesis flare for her.  She stopped Reglan about 2 months ago.  Does not have any Reglan at home.  She does continue taking Nexium twice daily for reflux.  Feels she is able to drink plenty of liquids.  Urine is not dark.  I am sending in a new prescription for Reglan 5 mg daily before breakfast.  Of note, she was on Reglan twice daily but had been doing well for 2 months without any Reglan until this recent flare, so we will start again at the lower dose.  Historically, she has tolerated Reglan well without any side effects.  We discussed the side effects again and she was advised to stop the medication if she developed any.  I am also sending in Zofran 4 mg every 8 hours as needed for nausea/vomiting.  She is advised to back down to soft foods or clear liquid diet until she is able to pick her medications up tomorrow.  Ensure she is drinking plenty of fluids to keep hydrated.  Urine should be pale yellow to clear.  She will call us back if she does not have any improvement over the next few days.

## 2019-05-29 ENCOUNTER — Telehealth (HOSPITAL_COMMUNITY): Payer: Self-pay | Admitting: *Deleted

## 2019-05-29 NOTE — Telephone Encounter (Signed)
Pt called c/o nausea and a headache states her stomach feels full and her bp is elevated. Per note from GI yesterday patient is having a gastroparesis flare and has not picked up her medications from the pharmacy. Pt will start her GI medications and see if that relieves the headache and elevated bp. If she still has those symptoms or worsening symptoms she will contact our office. Please see GI note below.     Erenest Rasher, PA-C  Physician Assistant Certified  Specialty:  Gastroenterology  Telephone Encounter  Signed  Encounter Date:  05/28/2019          Signed         Spoke with patient.  Nausea and abdominal pain started Saturday night.  Sunday she started with vomiting and constant abdominal pain.  Symptoms are improving.  Today she has intermittent stomach spasms, nausea, but no vomiting.  States this is a typical gastroparesis flare for her.  She stopped Reglan about 2 months ago.  Does not have any Reglan at home.  She does continue taking Nexium twice daily for reflux.  Feels she is able to drink plenty of liquids.  Urine is not dark.  I am sending in a new prescription for Reglan 5 mg daily before breakfast.  Of note, she was on Reglan twice daily but had been doing well for 2 months without any Reglan until this recent flare, so we will start again at the lower dose.  Historically, she has tolerated Reglan well without any side effects.  We discussed the side effects again and she was advised to stop the medication if she developed any.  I am also sending in Zofran 4 mg every 8 hours as needed for nausea/vomiting.  She is advised to back down to soft foods or clear liquid diet until she is able to pick her medications up tomorrow.  Ensure she is drinking plenty of fluids to keep hydrated.  Urine should be pale yellow to clear.  She will call us back if she does not have any improvement over the next few days.

## 2019-06-05 ENCOUNTER — Other Ambulatory Visit: Payer: Self-pay

## 2019-06-05 ENCOUNTER — Ambulatory Visit (INDEPENDENT_AMBULATORY_CARE_PROVIDER_SITE_OTHER): Payer: 59 | Admitting: Internal Medicine

## 2019-06-05 ENCOUNTER — Ambulatory Visit (HOSPITAL_COMMUNITY): Payer: 59

## 2019-06-05 ENCOUNTER — Encounter (INDEPENDENT_AMBULATORY_CARE_PROVIDER_SITE_OTHER): Payer: Self-pay | Admitting: Internal Medicine

## 2019-06-05 VITALS — BP 110/70 | HR 72 | Temp 97.6°F | Ht 64.0 in | Wt 128.0 lb

## 2019-06-05 DIAGNOSIS — R519 Headache, unspecified: Secondary | ICD-10-CM | POA: Diagnosis not present

## 2019-06-05 DIAGNOSIS — G8929 Other chronic pain: Secondary | ICD-10-CM

## 2019-06-05 DIAGNOSIS — N951 Menopausal and female climacteric states: Secondary | ICD-10-CM

## 2019-06-05 DIAGNOSIS — I10 Essential (primary) hypertension: Secondary | ICD-10-CM | POA: Diagnosis not present

## 2019-06-05 MED ORDER — PROGESTERONE MICRONIZED 200 MG PO CAPS: 200.0000 mg | ORAL_CAPSULE | Freq: Every day | ORAL | 3 refills | Status: DC

## 2019-06-05 MED FILL — PROGESTERONE MICRONIZED 200: 200 | 30 days supply | Qty: 30 | Fill #0

## 2019-06-05 NOTE — Progress Notes (Signed)
Metrics: Intervention Frequency ACO  Documented Smoking Status Yearly  Screened one or more times in 24 months  Cessation Counseling or  Active cessation medication Past 24 months  Past 24 months   Guideline developer: UpToDate (See UpToDate for funding source) Date Released: 2014       Wellness Office Visit  Subjective:  Patient ID: Karen [name], female    DOB: 11/27/1970  Age: 49 y.o. MRN: [id number]  CC: This lady was supposed to come in today for an annual physical exam but time was limited and so we did not office visit to address her immediate concerns. She is concerned about almost daily headaches for the last couple of months. HPI  She also describes hot flashes.  On the last visit when I had seen her, I prescribed her progesterone at night but unfortunately she never got this medication filled. She also has not been taking desiccated NP thyroid for the last couple of months, which is coincidental with the amount of time that she has been having headaches. The headaches are typically quite severe and can last all day but do not seem to be associated with photosensitivity but sometimes do cause nausea.  There is no loss of consciousness with the headaches nor any neurological symptoms or signs. She also had questions about COVID-19 vaccination as she is somewhat hesitant and she wonders whether she should be taking it. Past Medical History:  Diagnosis Date  . GERD (gastroesophageal reflux disease)   . Hypertension   . Hypothyroidism   . ILD (interstitial lung disease) ([place]) 02/08/2018  . Interstitial lung disease ([place])   . Multinodular thyroid    benign FNA 08/2017.  Marland Kitchen Perimenopause 02/27/2019  . Scleroderma ([place])   . Vitamin D deficiency disease 02/27/2019      Family History  Problem Relation Age of Onset  . Hypertension Father   . Hypertension Sister   . Hypertension Brother   . Diabetes Maternal Grandfather   . Diabetes Maternal Uncle   . Diabetes Mother   .  Colon cancer Neg Hx     Social History   Social History Narrative   Patient is right-handed. 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   Social History   Tobacco Use  . Smoking status: Never Smoker  . Smokeless tobacco: Never Used  Substance Use Topics  . Alcohol use: No    Current Meds  Medication Sig  . bisoprolol (ZEBETA) 5 MG tablet Take 1 tablet (5 mg total) by mouth daily.  . Cholecalciferol (VITAMIN D-3) 5000 units TABS Take 5,000 Units by mouth daily.   Marland Kitchen esomeprazole (NEXIUM) 40 MG capsule Take 40 mg by mouth 2 (two) times daily before a meal.   . furosemide (LASIX) 40 MG tablet Take every day except Tue and Thur (Patient taking differently: Take 40 mg by mouth daily. Take every day except Saturday and Sunday)  . metoCLOPramide (REGLAN) 5 MG tablet Take 1 tablet (5 mg total) by mouth daily before breakfast.  . Multiple Vitamins-Minerals (MEGA MULTIVITAMIN) POWD Take 1 Scoop by mouth daily. Mix 1 scoop in water  . mycophenolate (CELLCEPT) 500 MG tablet Take 3 tablets by mouth every morning and every evening.  . Nintedanib (OFEV) 150 MG CAPS Take 1 capsule (150 mg total) by mouth 2 (two) times daily.  . ondansetron (ZOFRAN) 4 MG tablet Take 1 tablet (4 mg total) by mouth every 8 (eight) hours as needed for nausea or vomiting.  Marland Kitchen  potassium chloride 20 MEQ/15ML (10%) SOLN Take 15 mLs (20 mEq total) by mouth 3 (three) times a week. Every Mon/Wed/Fri and other days when you take Furosemide  . spironolactone (ALDACTONE) 25 MG tablet Take 1 tablet (25 mg total) by mouth daily.  Marland Kitchen sulfamethoxazole-trimethoprim (BACTRIM DS) 800-160 MG tablet Take 1 tablet by mouth daily.  . [DISCONTINUED] clindamycin-benzoyl peroxide (BENZACLIN) gel as needed.   . [DISCONTINUED] lidocaine (XYLOCAINE) 2 % solution Use as directed 15 mLs in the mouth or throat every 6 (six) hours as needed (breakthrough reflux).  . [DISCONTINUED] thyroid (NP THYROID) 120 MG tablet  Take 120 mg by mouth daily.      Objective:   Today's Vitals: BP 110/70 (BP Location: Left Arm, Patient Position: Sitting, Cuff Size: Small)   Pulse 72   Temp 97.6 F (36.4 C) (Temporal)   Ht 5\' 4"  (1.626 m)   Wt 128 lb (58.1 kg)   BMI 21.97 kg/m  Vitals with BMI 06/05/2019 05/22/2019 04/24/2019  Height 5\' 4"  5\' 4"  5\' 4"   Weight 128 lbs 130 lbs 6 oz 129 lbs 13 oz  BMI 21.96 [id number] [id number]  Systolic A999333 [id number]  Diastolic 70 [id number] 68  Pulse 72 98 90     Physical Exam She is alert and orientated and there are no focal neurological signs.  Blood pressure is well controlled.      Assessment   1. Perimenopause   2. Essential hypertension   3. Chronic nonintractable headache, unspecified headache type       Tests ordered Orders Placed This Encounter  Procedures  . COMPLETE METABOLIC PANEL WITH GFR  . T3, free  . TSH     Plan: 1. Blood work is ordered as above. 2. I am going to try her on again on progesterone to see if this will help her hot flashes but also may help her headaches. 3. I suspect she needs to go back on desiccated NP thyroid but I will see what her thyroid function test show. 4. I also spent some time discussing COVID-19 vaccination and I would recommend that she receive the vaccination.  I also told her to check with her pulmonologist with this question too. 5. Further recommendations will depend on blood results and I will see her in about 3 months time for follow-up.   Meds ordered this encounter  Medications  . progesterone (PROMETRIUM) 200 MG capsule    Sig: Take 1 capsule (200 mg total) by mouth daily.    Dispense:  30 capsule    Refill:  3    Indria Bishara Luther Parody, MD

## 2019-06-06 ENCOUNTER — Other Ambulatory Visit (INDEPENDENT_AMBULATORY_CARE_PROVIDER_SITE_OTHER): Payer: Self-pay | Admitting: Internal Medicine

## 2019-06-06 LAB — TSH: TSH: 0.72 mIU/L

## 2019-06-06 LAB — COMPLETE METABOLIC PANEL WITH GFR
AG Ratio: 1.2 (calc) (ref 1.0–2.5)
ALT: 7 U/L (ref 6–29)
AST: 15 U/L (ref 10–35)
Albumin: 4.2 g/dL (ref 3.6–5.1)
Alkaline phosphatase (APISO): 71 U/L (ref 37–153)
BUN: 16 mg/dL (ref 7–25)
CO2: 27 mmol/L (ref 20–32)
Calcium: 10.9 mg/dL — ABNORMAL HIGH (ref 8.6–10.4)
Chloride: 103 mmol/L (ref 98–110)
Creat: 0.68 mg/dL (ref 0.50–1.05)
GFR, Est African American: 117 mL/min/{1.73_m2} (ref >=60)
GFR, Est Non African American: 101 mL/min/{1.73_m2} (ref >=60)
Globulin: 3.5 g/dL (calc) (ref 1.9–3.7)
Glucose, Bld: 92 mg/dL (ref 65–99)
Potassium: 4 mmol/L (ref 3.5–5.3)
Sodium: 140 mmol/L (ref 135–146)
Total Bilirubin: 0.4 mg/dL (ref 0.2–1.2)
Total Protein: 7.7 g/dL (ref 6.1–8.1)

## 2019-06-06 LAB — T3, FREE: T3, Free: 2.6 pg/mL (ref 2.3–4.2)

## 2019-06-06 MED ORDER — THYROID 30 MG PO TABS: 30.0000 mg | ORAL_TABLET | Freq: Every day | ORAL | 3 refills | Status: DC

## 2019-06-06 MED FILL — NP THYROID 30 MG TABLET: 30 | 30 days supply | Qty: 30 | Fill #0

## 2019-06-11 ENCOUNTER — Other Ambulatory Visit: Payer: Self-pay

## 2019-06-11 ENCOUNTER — Ambulatory Visit (INDEPENDENT_AMBULATORY_CARE_PROVIDER_SITE_OTHER)
Admission: RE | Admit: 2019-06-11 | Discharge: 2019-06-11 | Disposition: A | Discharge status: Home / Self Care | Payer: 59 | Source: Ambulatory Visit | Attending: Pulmonary Disease | Admitting: Pulmonary Disease

## 2019-06-11 DIAGNOSIS — J8410 Pulmonary fibrosis, unspecified: Secondary | ICD-10-CM | POA: Diagnosis not present

## 2019-06-11 DIAGNOSIS — J849 Interstitial pulmonary disease, unspecified: Secondary | ICD-10-CM | POA: Diagnosis not present

## 2019-06-12 ENCOUNTER — Inpatient Hospital Stay: Admission: RE | Admit: 2019-06-12 | Payer: 59 | Source: Ambulatory Visit

## 2019-06-16 ENCOUNTER — Telehealth (HOSPITAL_COMMUNITY): Payer: Self-pay | Admitting: *Deleted

## 2019-06-16 NOTE — Telephone Encounter (Signed)
Left message that she is scheduled for orientation/walk test for pulmonary rehab 06/19/2019 @ 1030.

## 2019-06-19 ENCOUNTER — Other Ambulatory Visit: Payer: Self-pay

## 2019-06-19 ENCOUNTER — Ambulatory Visit: Payer: 59 | Admitting: Pulmonary Disease

## 2019-06-19 ENCOUNTER — Encounter (HOSPITAL_COMMUNITY)
Admission: RE | Admit: 2019-06-19 | Discharge: 2019-06-19 | Disposition: A | Discharge status: Home / Self Care | Payer: 59 | Source: Ambulatory Visit | Attending: Pulmonary Disease | Admitting: Pulmonary Disease

## 2019-06-19 VITALS — BP 142/82 | HR 88 | Temp 97.9°F | Ht 64.0 in | Wt 122.8 lb

## 2019-06-19 DIAGNOSIS — J849 Interstitial pulmonary disease, unspecified: Secondary | ICD-10-CM | POA: Insufficient documentation

## 2019-06-19 NOTE — Telephone Encounter (Signed)
Dr Vaughan Browner please advise on pt email:  Dr Vaughan Browner I was wanting to know what do you advise about me taking the COVID vaccine I'm not sure if I should or not with my lung disease, I was told to ask you first because I don't know if it would be safe for me or not. Please advise. Thank you    Karen [name]

## 2019-06-19 NOTE — Progress Notes (Signed)
         Confirm Consent - In the setting of the current Covid19 crisis, you are scheduled for a phone visit with your Cardiac or Pulmonary team member.  Just as we do with many in-gym visits, in order for you to participate in this visit, we must obtain consent.  If you'd like, I can send this to your mychart (if signed up) or email for you to review.  Otherwise, I can obtain your verbal consent now.  By agreeing to a telephone visit, we'd like you to understand that the technology does not allow for your Cardiac or Pulmonary Rehab team member to perform a physical assessment, and thus may limit their ability to fully assess your ability to perform exercise programs. If your provider identifies any concerns that need to be evaluated in person, we will make arrangements to do so.  Finally, though the technology is pretty good, we cannot assure that it will always work on either your or our end and we cannot ensure that we have a secure connection.  Cardiac and Pulmonary Rehab Telehealth visits and "At Home" cardiac and pulmonary rehab are provided at no cost to you.        Are you willing to proceed?"        STAFF: Did the patient verbally acknowledge consent to telehealth visit? Document YES/NO here: Yes     Rosebud Poles RN  Cardiac and Pulmonary Rehab Staff        Date 06/19/19     @ Time 11:21 AM

## 2019-06-19 NOTE — Progress Notes (Signed)
Karen [name] 49 y.o. female Pulmonary Rehab Orientation Note Patient arrived today in Cardiac and Pulmonary Rehab for orientation to Pulmonary Rehab. She walked from the [facility] parking deck with minimal shortness of breath.. She does not carry portable oxygen. Per pt, she uses oxygen never. Color good, skin warm and dry. Patient is oriented to time and place. Patient's medical history, psychosocial health, and medications reviewed. Psychosocial assessment reveals pt lives alone, but stays with her mother during the week because patient's mother, who is retired,  helps patient with her meals so she can continue working full time. Patient has multiple health issues and it is a struggle for her to work full time. Pt is currently full time job doing as a CMA at [employer]. Pt reports her stress level is moderate. Areas of stress/anxiety include Health.  Pt does not exhibit signs of depression. She does experience difficulty sleeping, but attributes this to hot flashes.   PHQ2/9 score 0/3. Pt shows good  coping skills with not done outlook .  Offered emotional support and reassurance. We discusses that she may benefit from counseling to help   Will continue to monitor and evaluate progress toward psychosocial goal(s) of continued well being and handle her stress in a healthy manner. Physical assessment reveals heart rate is normal, breath sounds clear to auscultation, no wheezes, rales, or rhonchi. Grip strength equal, strong. Distal pulses 3+ without peripheral edema. Patient reports she does take medications as prescribed. Patient state she follows a Low Sodium diet. The patient reports no specific efforts to gain or lose weight.. Patient's weight will be monitored closely. Demonstration and practice of PLB using pulse oximeter. Patient able to return demonstration satisfactorily. Safety and hand hygiene in the exercise area reviewed with patient. Patient voices understanding of the information reviewed.  Department expectations discussed with patient and achievable goals were set. The patient shows enthusiasm about attending the program and we look forward to working with this nice lady. The patient completed a 6 min walk test today and to begin exercise on 07/01/19 in the 10am class.Marland Kitchen  [id number]

## 2019-06-19 NOTE — Progress Notes (Addendum)
Pulmonary Individual Treatment Plan  Patient Details  Name: Karen [name] MRN: [id number] Date of Birth: [birth year] Referring Provider:     Pulmonary Rehab Walk Test from 06/19/2019 in [place]  Referring Provider  Dr. Vaughan Browner      Initial Encounter Date:    Pulmonary Rehab Walk Test from 06/19/2019 in [place]  Date  06/19/19      Visit Diagnosis: Interstitial lung disease ([place])  Patient's Home Medications on Admission:   Current Outpatient Medications:  .  bisoprolol (ZEBETA) 5 MG tablet, Take 1 tablet (5 mg total) by mouth daily., Disp: 30 tablet, Rfl: 1 .  Cholecalciferol (VITAMIN D-3) 5000 units TABS, Take 5,000 Units by mouth daily. , Disp: , Rfl:  .  esomeprazole (NEXIUM) 40 MG capsule, Take 40 mg by mouth 2 (two) times daily before a meal. , Disp: , Rfl: 5 .  furosemide (LASIX) 40 MG tablet, Take every day except Tue and Thur (Patient taking differently: Take 40 mg by mouth daily. Take every day except Saturday and Sunday), Disp: 30 tablet, Rfl: 5 .  metoCLOPramide (REGLAN) 5 MG tablet, Take 1 tablet (5 mg total) by mouth daily before breakfast., Disp: 30 tablet, Rfl: 5 .  Multiple Vitamins-Minerals (MEGA MULTIVITAMIN) POWD, Take 1 Scoop by mouth daily. Mix 1 scoop in water, Disp: , Rfl:  .  mycophenolate (CELLCEPT) 500 MG tablet, Take 3 tablets by mouth every morning and every evening., Disp: , Rfl:  .  Nintedanib (OFEV) 150 MG CAPS, Take 1 capsule (150 mg total) by mouth 2 (two) times daily., Disp: 60 capsule, Rfl: 2 .  ondansetron (ZOFRAN) 4 MG tablet, Take 1 tablet (4 mg total) by mouth every 8 (eight) hours as needed for nausea or vomiting., Disp: 30 tablet, Rfl: 1 .  potassium chloride 20 MEQ/15ML (10%) SOLN, Take 15 mLs (20 mEq total) by mouth 3 (three) times a week. Every Mon/Wed/Fri and other days when you take Furosemide, Disp: 473 mL, Rfl: 0 .  progesterone (PROMETRIUM) 200 MG capsule, Take 1 capsule  (200 mg total) by mouth daily., Disp: 30 capsule, Rfl: 3 .  spironolactone (ALDACTONE) 25 MG tablet, Take 1 tablet (25 mg total) by mouth daily., Disp: 30 tablet, Rfl: 6 .  sulfamethoxazole-trimethoprim (BACTRIM DS) 800-160 MG tablet, Take 1 tablet by mouth daily., Disp: , Rfl:  .  thyroid (NP THYROID) 30 MG tablet, Take 1 tablet (30 mg total) by mouth daily before breakfast., Disp: 30 tablet, Rfl: 3  Past Medical History: Past Medical History:  Diagnosis Date  . GERD (gastroesophageal reflux disease)   . Hypertension   . Hypothyroidism   . ILD (interstitial lung disease) ([place]) 02/08/2018  . Interstitial lung disease ([place])   . Multinodular thyroid    benign FNA 08/2017.  Marland Kitchen Perimenopause 02/27/2019  . Scleroderma ([place])   . Vitamin D deficiency disease 02/27/2019    Tobacco Use: Social History   Tobacco Use  Smoking Status Never Smoker  Smokeless Tobacco Never Used    Labs: Recent Review Flowsheet Data    Labs for ITP Cardiac and Pulmonary Rehab Latest Ref Rng & Units 03/22/2017 09/27/2017 09/27/2017   Cholestrol 100 - 199 mg/dL 187 - -   LDLCALC 0 - 99 mg/dL 118(H) - -   HDL >39 mg/dL 43 - -   Trlycerides 0 - 149 mg/dL 130 - -   HCO3 20.0 - 28.0 mmol/L - 29.4(H) 29.3(H)   TCO2 22 - 32 mmol/L -  31 31   O2SAT % - 74.0 75.0      Capillary Blood Glucose: Lab Results  Component Value Date   GLUCAP 92 01/28/2018   GLUCAP 61 (L) 01/28/2018   GLUCAP 67 (L) 01/28/2018   GLUCAP 25 (LL) 01/28/2018   GLUCAP 25 (LL) 01/28/2018     Pulmonary Assessment Scores: Pulmonary Assessment Scores    Row Name 06/19/19 1119 06/19/19 1139       ADL UCSD   ADL Phase  Entry  --    SOB Score total  51  --      CAT Score   CAT Score  19  --      mMRC Score   mMRC Score  --  4      UCSD: Self-administered rating of dyspnea associated with activities of daily living (ADLs) 6-point scale (0 = "not at all" to 5 = "maximal or unable to do because of breathlessness")  Scoring Scores  range from 0 to 120.  Minimally important difference is 5 units  CAT: CAT can identify the health impairment of COPD patients and is better correlated with disease progression.  CAT has a scoring range of zero to 40. The CAT score is classified into four groups of low (less than 10), medium (10 - 20), high (21-30) and very high (31-40) based on the impact level of disease on health status. A CAT score over 10 suggests significant symptoms.  A worsening CAT score could be explained by an exacerbation, poor medication adherence, poor inhaler technique, or progression of COPD or comorbid conditions.  CAT MCID is 2 points  mMRC: mMRC (Modified Medical Research Council) Dyspnea Scale is used to assess the degree of baseline functional disability in patients of respiratory disease due to dyspnea. No minimal important difference is established. A decrease in score of 1 point or greater is considered a positive change.   Pulmonary Function Assessment: Pulmonary Function Assessment - 06/19/19 1032      Breath   Bilateral Breath Sounds  Clear    Shortness of Breath  Yes;Fear of Shortness of Breath;Limiting activity       Exercise Target Goals: Exercise Program Goal: Individual exercise prescription set using results from initial 6 min walk test and THRR while considering  patient's activity barriers and safety.   Exercise Prescription Goal: Initial exercise prescription builds to 30-45 minutes a day of aerobic activity, 2-3 days per week.  Home exercise guidelines will be given to patient during program as part of exercise prescription that the participant will acknowledge.  Activity Barriers & Risk Stratification: Activity Barriers & Cardiac Risk Stratification - 06/19/19 1030      Activity Barriers & Cardiac Risk Stratification   Activity Barriers  Deconditioning;Shortness of Breath       6 Minute Walk: 6 Minute Walk    Row Name 06/19/19 1130         6 Minute Walk   Phase  Initial      Distance  800 feet     Walk Time  6 minutes     # of Rest Breaks  0     MPH  1.5     METS  3.4     RPE  12     Perceived Dyspnea   2     VO2 Peak  12.19     Symptoms  Yes (comment)     Comments  Tiredness, +2 SOB     Resting HR  88 bpm  Resting BP  142/82     Resting Oxygen Saturation   95 %     Exercise Oxygen Saturation  during 6 min walk  92 %     Max Ex. HR  97 bpm     Max Ex. BP  146/82     2 Minute Post BP  124/84       Interval HR   1 Minute HR  95     2 Minute HR  97     3 Minute HR  97     4 Minute HR  95     5 Minute HR  95     6 Minute HR  94     2 Minute Post HR  83     Interval Heart Rate?  Yes       Interval Oxygen   Interval Oxygen?  Yes     Baseline Oxygen Saturation %  95 % RA     1 Minute Oxygen Saturation %  93 % RA     1 Minute Liters of Oxygen  -- RA     2 Minute Oxygen Saturation %  93 %     2 Minute Liters of Oxygen  -- RA     3 Minute Oxygen Saturation %  93 %     3 Minute Liters of Oxygen  -- RA     4 Minute Oxygen Saturation %  92 %     4 Minute Liters of Oxygen  -- RA     5 Minute Oxygen Saturation %  95 %     5 Minute Liters of Oxygen  -- RA     6 Minute Oxygen Saturation %  94 %     6 Minute Liters of Oxygen  -- RA     2 Minute Post Oxygen Saturation %  96 %     2 Minute Post Liters of Oxygen  -- RA        Oxygen Initial Assessment: Oxygen Initial Assessment - 06/19/19 1031      Home Oxygen   Home Oxygen Device  None    Sleep Oxygen Prescription  None    Home Exercise Oxygen Prescription  None    Home at Rest Exercise Oxygen Prescription  None    Compliance with Home Oxygen Use  No       Oxygen Re-Evaluation:   Oxygen Discharge (Final Oxygen Re-Evaluation):   Initial Exercise Prescription: Initial Exercise Prescription - 06/19/19 1100      Date of Initial Exercise RX and Referring Provider   Date  06/19/19    Referring Provider  Dr. Vaughan Browner    Expected Discharge Date  08/20/19      Recumbant Bike   Level  1     Watts  5    Minutes  15    METs  2.29      NuStep   Level  2    SPM  75    Minutes  15    METs  1.5      Prescription Details   Frequency (times per week)  1    Duration  Progress to 30 minutes of continuous aerobic without signs/symptoms of physical distress      Intensity   THRR 40-80% of Max Heartrate  68-136    Ratings of Perceived Exertion  11-13    Perceived Dyspnea  0-4      Progression   Progression  Continue to progress workloads  to maintain intensity without signs/symptoms of physical distress.      Resistance Training   Training Prescription  Yes    Weight  --   Resistance Bands- Orange    Reps  10-15       Perform Capillary Blood Glucose checks as needed.  Exercise Prescription Changes:   Exercise Comments:   Exercise Goals and Review: Exercise Goals    Row Name 06/19/19 1147             Exercise Goals   Increase Physical Activity  Yes       Intervention  Provide advice, education, support and counseling about physical activity/exercise needs.;Develop an individualized exercise prescription for aerobic and resistive training based on initial evaluation findings, risk stratification, comorbidities and participant's personal goals.       Expected Outcomes  Short Term: Attend rehab on a regular basis to increase amount of physical activity.;Long Term: Add in home exercise to make exercise part of routine and to increase amount of physical activity.;Long Term: Exercising regularly at least 3-5 days a week.       Increase Strength and Stamina  Yes       Intervention  Provide advice, education, support and counseling about physical activity/exercise needs.;Develop an individualized exercise prescription for aerobic and resistive training based on initial evaluation findings, risk stratification, comorbidities and participant's personal goals.       Expected Outcomes  Short Term: Increase workloads from initial exercise prescription for resistance, speed,  and METs.;Short Term: Perform resistance training exercises routinely during rehab and add in resistance training at home;Long Term: Improve cardiorespiratory fitness, muscular endurance and strength as measured by increased METs and functional capacity (6MWT)       Able to understand and use rate of perceived exertion (RPE) scale  Yes       Intervention  Provide education and explanation on how to use RPE scale       Expected Outcomes  Short Term: Able to use RPE daily in rehab to express subjective intensity level;Long Term:  Able to use RPE to guide intensity level when exercising independently       Able to understand and use Dyspnea scale  Yes       Intervention  Provide education and explanation on how to use Dyspnea scale       Expected Outcomes  Short Term: Able to use Dyspnea scale daily in rehab to express subjective sense of shortness of breath during exertion;Long Term: Able to use Dyspnea scale to guide intensity level when exercising independently       Knowledge and understanding of Target Heart Rate Range (THRR)  Yes       Intervention  Provide education and explanation of THRR including how the numbers were predicted and where they are located for reference       Expected Outcomes  Short Term: Able to state/look up THRR;Long Term: Able to use THRR to govern intensity when exercising independently;Short Term: Able to use daily as guideline for intensity in rehab       Able to check pulse independently  Yes       Intervention  Provide education and demonstration on how to check pulse in carotid and radial arteries.;Review the importance of being able to check your own pulse for safety during independent exercise       Expected Outcomes  Short Term: Able to explain why pulse checking is important during independent exercise;Long Term: Able to check pulse independently and accurately  Understanding of Exercise Prescription  Yes       Intervention  Provide education, explanation, and  written materials on patient's individual exercise prescription       Expected Outcomes  Short Term: Able to explain program exercise prescription;Long Term: Able to explain home exercise prescription to exercise independently          Exercise Goals Re-Evaluation :   Discharge Exercise Prescription (Final Exercise Prescription Changes):   Nutrition:  Target Goals: Understanding of nutrition guidelines, daily intake of sodium 1500mg , cholesterol 200mg , calories 30% from fat and 7% or less from saturated fats, daily to have 5 or more servings of fruits and vegetables.  Biometrics: Pre Biometrics - 06/19/19 1101      Pre Biometrics   Grip Strength  17 kg        Nutrition Therapy Plan and Nutrition Goals:   Nutrition Assessments:   Nutrition Goals Re-Evaluation:   Nutrition Goals Discharge (Final Nutrition Goals Re-Evaluation):   Psychosocial: Target Goals: Acknowledge presence or absence of significant depression and/or stress, maximize coping skills, provide positive support system. Participant is able to verbalize types and ability to use techniques and skills needed for reducing stress and depression.  Initial Review & Psychosocial Screening: Initial Psych Review & Screening - 06/19/19 1034      Initial Review   Current issues with  None Identified      Family Dynamics   Good Support System?  Yes    Comments  is dealing with chronic illnesses, mother is very supportive, for now she does not want to see a counselor, discussed that would be an option if she feels she needs help.      Barriers   Psychosocial barriers to participate in program  There are no identifiable barriers or psychosocial needs.      Screening Interventions   Interventions  Encouraged to exercise       Quality of Life Scores:  Scores of 19 and below usually indicate a poorer quality of life in these areas.  A difference of  2-3 points is a clinically meaningful difference.  A difference  of 2-3 points in the total score of the Quality of Life Index has been associated with significant improvement in overall quality of life, self-image, physical symptoms, and general health in studies assessing change in quality of life.  PHQ-9: Recent Review Flowsheet Data    Depression screen [facility] 2/9 06/19/2019 08/30/2017   Decreased Interest 0 0   Down, Depressed, Hopeless 0 0   PHQ - 2 Score 0 0   Altered sleeping 1  -   Tired, decreased energy 2 -   Change in appetite 0 -   Feeling bad or failure about yourself  0 -   Trouble concentrating 0 -   Moving slowly or fidgety/restless 0 -   Suicidal thoughts 0 -   PHQ-9 Score 3 -   Difficult doing work/chores Somewhat difficult -     Interpretation of Total Score  Total Score Depression Severity:  1-4 = Minimal depression, 5-9 = Mild depression, 10-14 = Moderate depression, 15-19 = Moderately severe depression, 20-27 = Severe depression   Psychosocial Evaluation and Intervention: Psychosocial Evaluation - 06/19/19 1037      Psychosocial Evaluation & Interventions   Interventions  Encouraged to exercise with the program and follow exercise prescription;Stress management education;Relaxation education    Comments  follow for change in psychosocial issues.    Continue Psychosocial Services   Follow up required by staff  Psychosocial Re-Evaluation:   Psychosocial Discharge (Final Psychosocial Re-Evaluation):   Education: Education Goals: Education classes will be provided on a weekly basis, covering required topics. Participant will state understanding/return demonstration of topics presented.  Learning Barriers/Preferences: Learning Barriers/Preferences - 06/19/19 1038      Learning Barriers/Preferences   Learning Barriers  None    Learning Preferences  Audio;Computer/Internet;Group Instruction;Individual Instruction;Pictoral;Skilled Demonstration;Verbal Instruction;Video;Written Material       Education Topics: Risk  Factor Reduction:  -Group instruction that is supported by a PowerPoint presentation. Instructor discusses the definition of a risk factor, different risk factors for pulmonary disease, and how the heart and lungs work together.     Nutrition for Pulmonary Patient:  -Group instruction provided by PowerPoint slides, verbal discussion, and written materials to support subject matter. The instructor gives an explanation and review of healthy diet recommendations, which includes a discussion on weight management, recommendations for fruit and vegetable consumption, as well as protein, fluid, caffeine, fiber, sodium, sugar, and alcohol. Tips for eating when patients are short of breath are discussed.   Pursed Lip Breathing:  -Group instruction that is supported by demonstration and informational handouts. Instructor discusses the benefits of pursed lip and diaphragmatic breathing and detailed demonstration on how to preform both.     Oxygen Safety:  -Group instruction provided by PowerPoint, verbal discussion, and written material to support subject matter. There is an overview of "What is Oxygen" and "Why do we need it".  Instructor also reviews how to create a safe environment for oxygen use, the importance of using oxygen as prescribed, and the risks of noncompliance. There is a brief discussion on traveling with oxygen and resources the patient may utilize.   Oxygen Equipment:  -Group instruction provided by [facility] Staff utilizing handouts, written materials, and equipment demonstrations.   Signs and Symptoms:  -Group instruction provided by written material and verbal discussion to support subject matter. Warning signs and symptoms of infection, stroke, and heart attack are reviewed and when to call the physician/911 reinforced. Tips for preventing the spread of infection discussed.   Advanced Directives:  -Group instruction provided by verbal instruction and written material to support  subject matter. Instructor reviews Advanced Directive laws and proper instruction for filling out document.   Pulmonary Video:  -Group video education that reviews the importance of medication and oxygen compliance, exercise, good nutrition, pulmonary hygiene, and pursed lip and diaphragmatic breathing for the pulmonary patient.   Exercise for the Pulmonary Patient:  -Group instruction that is supported by a PowerPoint presentation. Instructor discusses benefits of exercise, core components of exercise, frequency, duration, and intensity of an exercise routine, importance of utilizing pulse oximetry during exercise, safety while exercising, and options of places to exercise outside of rehab.     Pulmonary Medications:  -Verbally interactive group education provided by instructor with focus on inhaled medications and proper administration.   Anatomy and Physiology of the Respiratory System and Intimacy:  -Group instruction provided by PowerPoint, verbal discussion, and written material to support subject matter. Instructor reviews respiratory cycle and anatomical components of the respiratory system and their functions. Instructor also reviews differences in obstructive and restrictive respiratory diseases with examples of each. Intimacy, Sex, and Sexuality differences are reviewed with a discussion on how relationships can change when diagnosed with pulmonary disease. Common sexual concerns are reviewed.   MD DAY -A group question and answer session with a medical doctor that allows participants to ask questions that relate to their pulmonary disease state.  OTHER EDUCATION -Group or individual verbal, written, or video instructions that support the educational goals of the pulmonary rehab program.   Holiday Eating Survival Tips:  -Group instruction provided by PowerPoint slides, verbal discussion, and written materials to support subject matter. The instructor gives patients tips,  tricks, and techniques to help them not only survive but enjoy the holidays despite the onslaught of food that accompanies the holidays.   Knowledge Questionnaire Score: Knowledge Questionnaire Score - 06/19/19 1112      Knowledge Questionnaire Score   Pre Score  15/18       Core Components/Risk Factors/Patient Goals at Admission: Personal Goals and Risk Factors at Admission - 06/19/19 1039      Core Components/Risk Factors/Patient Goals on Admission   Improve shortness of breath with ADL's  Yes    Intervention  Provide education, individualized exercise plan and daily activity instruction to help decrease symptoms of SOB with activities of daily living.    Expected Outcomes  Short Term: Improve cardiorespiratory fitness to achieve a reduction of symptoms when performing ADLs;Long Term: Be able to perform more ADLs without symptoms or delay the onset of symptoms       Core Components/Risk Factors/Patient Goals Review:  Goals and Risk Factor Review    Row Name 06/19/19 1039             Core Components/Risk Factors/Patient Goals Review   Personal Goals Review  Develop more efficient breathing techniques such as purse lipped breathing and diaphragmatic breathing and practicing self-pacing with activity.;Increase knowledge of respiratory medications and ability to use respiratory devices properly.;Improve shortness of breath with ADL's          Core Components/Risk Factors/Patient Goals at Discharge (Final Review):  Goals and Risk Factor Review - 06/19/19 1039      Core Components/Risk Factors/Patient Goals Review   Personal Goals Review  Develop more efficient breathing techniques such as purse lipped breathing and diaphragmatic breathing and practicing self-pacing with activity.;Increase knowledge of respiratory medications and ability to use respiratory devices properly.;Improve shortness of breath with ADL's       ITP Comments:   Comments:

## 2019-06-20 NOTE — Telephone Encounter (Signed)
Yes. You should definitely get the COVID-19 vaccine whenever it is available for you.  With your lung disease and immunosuppression you are at high risk of getting severe pneumonia in case of infection so must be protected with vaccine.

## 2019-07-03 ENCOUNTER — Ambulatory Visit: Payer: 59 | Admitting: Pulmonary Disease

## 2019-07-03 ENCOUNTER — Encounter: Payer: Self-pay | Admitting: Pulmonary Disease

## 2019-07-03 ENCOUNTER — Encounter (HOSPITAL_COMMUNITY)
Admission: RE | Admit: 2019-07-03 | Discharge: 2019-07-03 | Disposition: A | Discharge status: Home / Self Care | Payer: 59 | Source: Ambulatory Visit | Attending: Pulmonary Disease | Admitting: Pulmonary Disease

## 2019-07-03 ENCOUNTER — Other Ambulatory Visit: Payer: Self-pay

## 2019-07-03 VITALS — BP 116/70 | HR 85 | Temp 97.6°F | Ht 64.0 in | Wt 133.2 lb

## 2019-07-03 DIAGNOSIS — J849 Interstitial pulmonary disease, unspecified: Secondary | ICD-10-CM | POA: Diagnosis not present

## 2019-07-03 DIAGNOSIS — Z5181 Encounter for therapeutic drug level monitoring: Secondary | ICD-10-CM | POA: Diagnosis not present

## 2019-07-03 LAB — COMPREHENSIVE METABOLIC PANEL
ALT: 9 U/L (ref 0–35)
AST: 18 U/L (ref 0–37)
Albumin: 3.8 g/dL (ref 3.5–5.2)
Alkaline Phosphatase: 64 U/L (ref 39–117)
BUN: 11 mg/dL (ref 6–23)
CO2: 28 mEq/L (ref 19–32)
Calcium: 10.5 mg/dL (ref 8.4–10.5)
Chloride: 104 mEq/L (ref 96–112)
Creatinine, Ser: 0.68 mg/dL (ref 0.40–1.20)
GFR: 110.31 mL/min (ref >60.00)
Glucose, Bld: 86 mg/dL (ref 70–99)
Potassium: 3.7 mEq/L (ref 3.5–5.1)
Sodium: 138 mEq/L (ref 135–145)
Total Bilirubin: 0.4 mg/dL (ref 0.2–1.2)
Total Protein: 7.6 g/dL (ref 6.0–8.3)

## 2019-07-03 LAB — CBC WITH DIFFERENTIAL/PLATELET
Basophils Absolute: 0 10*3/uL (ref 0.0–0.1)
Basophils Relative: 0.6 % (ref 0.0–3.0)
Eosinophils Absolute: 0.1 10*3/uL (ref 0.0–0.7)
Eosinophils Relative: 1.3 % (ref 0.0–5.0)
HCT: 36.2 % (ref 36.0–46.0)
Hemoglobin: 11.5 g/dL — ABNORMAL LOW (ref 12.0–15.0)
Lymphocytes Relative: 26.7 % (ref 12.0–46.0)
Lymphs Abs: 2.1 10*3/uL (ref 0.7–4.0)
MCHC: 31.8 g/dL (ref 30.0–36.0)
MCV: 85.5 fl (ref 78.0–100.0)
Monocytes Absolute: 0.9 10*3/uL (ref 0.1–1.0)
Monocytes Relative: 10.8 % (ref 3.0–12.0)
Neutro Abs: 4.8 10*3/uL (ref 1.4–7.7)
Neutrophils Relative %: 60.6 % (ref 43.0–77.0)
Platelets: 230 10*3/uL (ref 150.0–400.0)
RBC: 4.23 Mil/uL (ref 3.87–5.11)
RDW: 14.4 % (ref 11.5–15.5)
WBC: 7.9 10*3/uL (ref 4.0–10.5)

## 2019-07-03 LAB — HEPATIC FUNCTION PANEL
ALT: 9 U/L (ref 0–35)
AST: 18 U/L (ref 0–37)
Albumin: 3.8 g/dL (ref 3.5–5.2)
Alkaline Phosphatase: 64 U/L (ref 39–117)
Bilirubin, Direct: 0.1 mg/dL (ref 0.0–0.3)
Total Bilirubin: 0.4 mg/dL (ref 0.2–1.2)
Total Protein: 7.6 g/dL (ref 6.0–8.3)

## 2019-07-03 NOTE — Addendum Note (Signed)
Addended by: Suzzanne Cloud E on: 07/03/2019 11:37 AM   Modules accepted: Orders

## 2019-07-03 NOTE — Progress Notes (Signed)
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 Haroldine Laws on 09/27/17 with no evidence of pulmonary hypertension Started on CellCept May 2019 and uptitrated to max dose of 1.5 mg twice daily.  She had a hospitalization in [facility] in March 2019 for hypertension.  A CT chest scan at that time showed subtle lower lung findings of atelectasis, groundglass.  She has a right thyroid nodule which was biopsied on 08/15/17 with pathology showing benign findings.  Thyroid function tests are normal.  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  Reviewed note from Dr. Trudie Reed, rheumatology 07/12/2017 Seen for joint pain, Raynaud's, elevated CK, skin thickening Serologies positive for Sjogren's negative,, normal CK and aldolase.  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  No mold. No hot tub, Jacuzzi. She has down comforter.  Smoking history: Never smoker Travel history: Grew up in [place].  Lived in [place] and [place] Relevant family history: No family history of lung disease.    Interim history: Continues on CellCept 1.5 mg twice daily.   States that breathing is stable.  She does note increased cough and chest tightness at night She is return to work full-time.  Has been evaluated at [facility] transplant who recommended pulmonary rehab She has started rehab this week at [facility] on 04/25/2019.  She is tolerating the therapy  well.  Outpatient Encounter Medications as of 07/03/2019  Medication Sig  . bisoprolol (ZEBETA) 5 MG tablet Take 1 tablet (5 mg total) by mouth daily.  . Cholecalciferol (VITAMIN D-3) 5000 units TABS Take 5,000 Units by mouth daily.   Marland Kitchen esomeprazole (NEXIUM) 40 MG capsule Take 40 mg by mouth 2 (two) times daily before a meal.   . furosemide (LASIX) 40 MG tablet Take every day except Tue and Thur (Patient taking differently: Take 40 mg by mouth daily. Take every day except Saturday and Sunday)  . metoCLOPramide (REGLAN) 5 MG tablet Take 1 tablet (5 mg total) by mouth daily before breakfast.  . Multiple Vitamins-Minerals (MEGA MULTIVITAMIN) POWD Take 1 Scoop by mouth daily. Mix 1 scoop in water  . mycophenolate (CELLCEPT) 500 MG tablet Take 3 tablets by mouth every morning and every evening.  . Nintedanib (OFEV) 150 MG CAPS Take 1 capsule (150 mg total) by mouth 2 (two) times daily.  . ondansetron (ZOFRAN) 4 MG tablet Take 1 tablet (4 mg total) by mouth every 8 (eight) hours as needed for nausea or vomiting.  . potassium chloride 20 MEQ/15ML (10%) SOLN Take 15 mLs (20 mEq total) by mouth 3 (three) times a week. Every Mon/Wed/Fri and other days when you take Furosemide  . progesterone (PROMETRIUM) 200 MG capsule Take 1 capsule (200 mg total) by mouth daily.  Marland Kitchen spironolactone (ALDACTONE) 25 MG tablet Take 1 tablet (25 mg total) by mouth daily.  Marland Kitchen sulfamethoxazole-trimethoprim (BACTRIM DS) 800-160 MG tablet Take 1 tablet by mouth daily.  Marland Kitchen thyroid (NP THYROID) 30 MG tablet Take 1 tablet (30 mg  total) by mouth daily before breakfast.   No facility-administered encounter medications on file as of 07/03/2019.    Physical Exam: Blood pressure 116/70, pulse 85, temperature 97.6 F (36.4 C), temperature source Temporal, height 5\' 4"  (1.626 m), weight 133 lb 3.2 oz (60.4 kg), SpO2 97 %. Gen:      No acute distress HEENT:  EOMI, sclera anicteric Neck:     No masses; no thyromegaly Lungs:    Scattered  basal crackles CV:         Regular rate and rhythm; no murmurs Abd:      + bowel sounds; soft, non-tender; no palpable masses, no distension Ext:    No edema; adequate peripheral perfusion Skin:      Warm and dry; no rash Neuro: alert and oriented x 3 Psych: normal mood and affect  Data Reviewed: Imaging CT chest, [facility] 07/24/2017 Dependent atelectasis, subtle groundglass opacities at the base.  2.5 cm right thyroid nodule.  I have reviewed the images personally  CT high-resolution 09/18/2017- patchy reticular groundglass opacities, reticulation with subpleural sparing.  Mild traction bronchiectasis.  No honeycombing.  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  Aortic atherosclerosis, three-vessel coronary artery disease.  CT high-resolution 06/27/2018-stable interstitial lung disease.  CT high-resolution 06/11/2019-stable interstitial lung disease I have reviewed the images personally.  PFTs FENO 09/06/2017-unable to complete  07/27/2017 FVC 1.65 (54%), FEV1 1.53 (62%], F/F 93, TLC 50, DLCO 44%, DLCO/VA 131%  10/25/2017 FVC 1.51 [50%], FEV1 1.26 [51%], F/F 83  01/29/2018 FVC 1.69 [5%), FEV1 1.50 [61%], F/F 89, DLCO 10.57 [41%]  07/01/2018 FVC 1.57 [52%), FEV1 1.47 [60%], F/F 93, DLCO 12.54 [59%)  01/06/2019 FVC 1.58 [52%], FEV1 1.46 [60%], F/F 92, TLC 2.62 [50%], DLCO 9.16 [42%] Severe restriction and diffusion impairment.  FVC is stable but DLCO has worsened.  6-minute walk  10/23/2017- 144 m Post walk heart rate, stats 94, 91%  02/04/2018- 249 m Post walk heart rate, sats 101, 99%  06/06/2018-212 m Post walk heart rate, sats 86, 92%  01/30/2019-126m Post walk heart rate, sats 83, 98%  Labs 08/20/2017-ANA, centromere, rheumatoid factor, SCL 70-negative QuantiFERON 09/12/2017-negative Hepatitis B, C screening 09/21/2017-negative G6PD 09/25/2017-13  Labs from Dr. Trudie Reed 07/12/17 Rheumatoid factor  < 10, CCP-8, ANCA-negative, double-stranded DNA-negative, Smith antibody-negative, SCL 70-negative, RNP-negative SSA 5.8, SSB 1.5 Smith antibody-negative, ANA IFA-negative Myositis panel-negative CK 165, C4-30, C3-168, total complement greater than 60, aldolase 8.8 Sed rate 9, CRP 0.9  CBC, hepatic function panel 06/17/2018-within normal limits.  Cardiac Cardiac cath 09/27/17 RA = 2 RV = 33/6 PA = 32/10 (19) PCW = 6 Fick cardiac output/index = 5.0/3.0 PVR = 2.6 Ao sat = 99% PA sat = 74%, 75%  Overnight oximetry 01/30/2019- test time 8 hours 25 minutes Lowest O2 sat 87%.  Mean O2 sat 95%.  Time spent less than 88% - 1 minute 30 seconds No significant desaturation.  Does not need supplemental oxygen.  Assessment:  Scleroderma ILD Continue on CellCept 1.5 g twice daily, Bactrim prophylaxis.   Clinically she is stable but PFTs show reduction in DLCO and there is reduction in 6-minute walk test No evidence of pulmonary hypertension on right heart catheterization in the past.  With worsening PFTs we have started her on Ofev in December 2020 Check CMP, CBC today for monitoring CT this month reviewed with stable pulmonary fibrosis Continue pulmonary rehab Follow-up at [facility] transplant.  She is still early stage with regard to  transplant but it is good that she has established with them now  Esophageal dysfunction, esophageal stricture, GERD Continue on Protonix 20 mg twice daily Follows with [facility] gastroenterology  Plan/Recommendations: - Continue CellCept at current dose of 1.5 twice daily. - Continue Ofev - CBC, CMP - Bactrim prophylaxis. - Pulmonary rehab  Marshell Garfinkel MD [facility] 07/03/2019, 11:18 AM

## 2019-07-03 NOTE — Patient Instructions (Signed)
Continue CellCept, Ofev and Bactrim We will check labs today including comprehensive metabolic panel, CBC Follow-up in 1 month for repeat labs with either me or nurse practitioner

## 2019-07-03 NOTE — Progress Notes (Signed)
Daily Session Note  Patient Details  Name: Karen [name] MRN: [id number] Date of Birth: October 09, 1970 Referring Provider:     Pulmonary Rehab Walk Test from 06/19/2019 in [place]  Referring Provider  Dr. Vaughan Browner      Encounter Date: 07/03/2019  Check In: Session Check In - 07/03/19 1047      Check-In   Supervising physician immediately available to respond to emergencies  [facility] immediately available    Physician(s)  Dr. Cyndia Skeeters    Location  [facility]    Staff Present  Rosebud Poles, RN, Bjorn Loser, MS, Exercise Physiologist;Lisa Ysidro Evert, RN    Virtual Visit  No    Medication changes reported      No    Fall or balance concerns reported     No    Tobacco Cessation  No Change    Warm-up and Cool-down  Performed as group-led instruction    Resistance Training Performed  Yes    VAD Patient?  No    PAD/SET Patient?  No      Pain Assessment   Currently in Pain?  No/denies    Multiple Pain Sites  No       Capillary Blood Glucose: No results found for this or any previous visit (from the past 24 hour(s)).    Social History   Tobacco Use  Smoking Status Never Smoker  Smokeless Tobacco Never Used    Goals Met:  Proper associated with RPD/PD & O2 Sat Exercise tolerated well Strength training completed today  Goals Unmet:  Not Applicable  Comments: Service time is from 0945 to 8    Dr. Fransico Him is Medical Director for Cardiac Rehab at [facility].

## 2019-07-10 ENCOUNTER — Other Ambulatory Visit: Payer: Self-pay

## 2019-07-10 ENCOUNTER — Encounter (HOSPITAL_COMMUNITY)
Admission: RE | Admit: 2019-07-10 | Discharge: 2019-07-10 | Disposition: A | Discharge status: Home / Self Care | Payer: 59 | Source: Ambulatory Visit | Attending: Pulmonary Disease | Admitting: Pulmonary Disease

## 2019-07-10 VITALS — Wt 134.7 lb

## 2019-07-10 DIAGNOSIS — J849 Interstitial pulmonary disease, unspecified: Secondary | ICD-10-CM | POA: Diagnosis not present

## 2019-07-10 NOTE — Progress Notes (Signed)
Patient came in for second day of exercise and was very tired afterwards.  Upon last vital sign check she informed me that she had not eaten breakfast and felt dizzy and nauseated. Gave patient ginger ale and crackers, but she was not able to eat much due to feeling nauseated.  BP 136/80, oxygen saturations 95%, heart rate 91.  She was too nauseated and jittery to drive herself home, after multiple attempts her sister Neoma Laming was contacted and she will come to pick her up.  I also called her supervisor, Janan Halter to tell her that patient would not be returning to work.  She was later able to sip ginger ale and eat a few crackers and nausea improved and had her lying down in our treatment room.  Recheck BP 142/90, states she did not take her blood pressure medications this morning.  She stayed in our department until her sister picked her up.

## 2019-07-10 NOTE — Progress Notes (Signed)
Patient's sister still has not left to pick her up @ 1230. Patient feeling much better, she has drank 2 ginger ales and eaten 2 packs of crackers, she walked around the gym and felt fine, no dizziness or nausea. I cleared her to drive herself home, she agreed.  I walked with her to the car and she tolerated it well.

## 2019-07-10 NOTE — Progress Notes (Signed)
Daily Session Note  Patient Details  Name: Karen [name] MRN: [id number] Date of Birth: 11-27-1970 Referring Provider:     Pulmonary Rehab Walk Test from 06/19/2019 in [place]  Referring Provider  Dr. Vaughan Browner      Encounter Date: 07/10/2019  Check In: Session Check In - 07/10/19 1141      Check-In   Supervising physician immediately available to respond to emergencies  [facility] immediately available    Physician(s)  Dr. Broadus John    Location  [facility]    Staff Present  Rosebud Poles, RN, Bjorn Loser, MS, Exercise Physiologist;Brittany Durene Fruits, BS, ACSM CEP, Exercise Physiologist    Virtual Visit  No    Medication changes reported      No    Fall or balance concerns reported     No    Tobacco Cessation  No Change    Warm-up and Cool-down  Performed as group-led instruction    Resistance Training Performed  Yes    VAD Patient?  No    PAD/SET Patient?  No      Pain Assessment   Currently in Pain?  No/denies    Multiple Pain Sites  No       Capillary Blood Glucose: No results found for this or any previous visit (from the past 24 hour(s)).    Social History   Tobacco Use  Smoking Status Never Smoker  Smokeless Tobacco Never Used    Goals Met:  Proper associated with RPD/PD & O2 Sat Strength training completed today  Goals Unmet:  She was able to complete her exercise, but afterwards became nauseated and dizzy due to not eating breakfast and taking half of her medications  Comments: Service time is from 0945 to [place]    Dr. Fransico Him is Medical Director for Cardiac Rehab at [facility].

## 2019-07-15 DIAGNOSIS — K3184 Gastroparesis: Secondary | ICD-10-CM | POA: Diagnosis not present

## 2019-07-15 DIAGNOSIS — K2100 Gastro-esophageal reflux disease with esophagitis, without bleeding: Secondary | ICD-10-CM | POA: Diagnosis not present

## 2019-07-17 ENCOUNTER — Ambulatory Visit: Payer: 59 | Admitting: Pulmonary Disease

## 2019-07-17 ENCOUNTER — Other Ambulatory Visit: Payer: Self-pay

## 2019-07-17 ENCOUNTER — Encounter (HOSPITAL_COMMUNITY)
Admission: RE | Admit: 2019-07-17 | Discharge: 2019-07-17 | Disposition: A | Discharge status: Home / Self Care | Payer: 59 | Source: Ambulatory Visit | Attending: Pulmonary Disease | Admitting: Pulmonary Disease

## 2019-07-17 DIAGNOSIS — J849 Interstitial pulmonary disease, unspecified: Secondary | ICD-10-CM | POA: Diagnosis not present

## 2019-07-17 NOTE — Progress Notes (Signed)
Daily Session Note  Patient Details  Name: Karen [name] MRN: [id number] Date of Birth: Sep 16, 1970 Referring Provider:     Pulmonary Rehab Walk Test from 06/19/2019 in [place]  Referring Provider  Dr. Vaughan Browner      Encounter Date: 07/17/2019  Check In: Session Check In - 07/17/19 1115      Check-In   Supervising physician immediately available to respond to emergencies  [facility] immediately available    Physician(s)  Dr. Dessa Phi    Location  [facility]    Staff Present  Rosebud Poles, RN, Bjorn Loser, MS, Exercise Physiologist;Lisa Ysidro Evert, RN    Virtual Visit  No    Medication changes reported      No    Fall or balance concerns reported     No    Tobacco Cessation  No Change    Warm-up and Cool-down  Performed as group-led instruction    Resistance Training Performed  Yes    VAD Patient?  No    PAD/SET Patient?  No      Pain Assessment   Currently in Pain?  No/denies    Multiple Pain Sites  No       Capillary Blood Glucose: No results found for this or any previous visit (from the past 24 hour(s)).    Social History   Tobacco Use  Smoking Status Never Smoker  Smokeless Tobacco Never Used    Goals Met:  Proper associated with RPD/PD & O2 Sat Exercise tolerated well Strength training completed today  Goals Unmet:  Not Applicable  Comments: Service time is from 1005 to 29    Dr. Fransico Him is Medical Director for Cardiac Rehab at [facility].

## 2019-07-23 ENCOUNTER — Other Ambulatory Visit (HOSPITAL_COMMUNITY): Payer: Self-pay | Admitting: Internal Medicine

## 2019-07-23 MED FILL — FUROSEMIDE 40 MG TAB: 40 | 30 days supply | Qty: 30 | Fill #1

## 2019-07-24 ENCOUNTER — Encounter (HOSPITAL_COMMUNITY)
Admission: RE | Admit: 2019-07-24 | Discharge: 2019-07-24 | Disposition: A | Discharge status: Home / Self Care | Payer: 59 | Source: Ambulatory Visit | Attending: Pulmonary Disease | Admitting: Pulmonary Disease

## 2019-07-24 ENCOUNTER — Ambulatory Visit (HOSPITAL_COMMUNITY)
Admission: RE | Admit: 2019-07-24 | Discharge: 2019-07-24 | Disposition: A | Discharge status: Home / Self Care | Payer: 59 | Source: Ambulatory Visit | Attending: Internal Medicine | Admitting: Internal Medicine

## 2019-07-24 ENCOUNTER — Other Ambulatory Visit: Payer: Self-pay

## 2019-07-24 ENCOUNTER — Encounter (HOSPITAL_COMMUNITY): Payer: Self-pay | Admitting: Internal Medicine

## 2019-07-24 VITALS — BP 114/88 | HR 93 | Wt 133.6 lb

## 2019-07-24 DIAGNOSIS — J849 Interstitial pulmonary disease, unspecified: Secondary | ICD-10-CM | POA: Diagnosis not present

## 2019-07-24 DIAGNOSIS — Z888 Allergy status to other drugs, medicaments and biological substances status: Secondary | ICD-10-CM | POA: Insufficient documentation

## 2019-07-24 DIAGNOSIS — M349 Systemic sclerosis, unspecified: Secondary | ICD-10-CM

## 2019-07-24 DIAGNOSIS — I272 Pulmonary hypertension, unspecified: Secondary | ICD-10-CM

## 2019-07-24 DIAGNOSIS — I10 Essential (primary) hypertension: Secondary | ICD-10-CM | POA: Diagnosis not present

## 2019-07-24 DIAGNOSIS — I509 Heart failure, unspecified: Secondary | ICD-10-CM | POA: Diagnosis present

## 2019-07-24 DIAGNOSIS — Z79890 Hormone replacement therapy (postmenopausal): Secondary | ICD-10-CM | POA: Diagnosis not present

## 2019-07-24 DIAGNOSIS — I2721 Secondary pulmonary arterial hypertension: Secondary | ICD-10-CM | POA: Insufficient documentation

## 2019-07-24 DIAGNOSIS — Z833 Family history of diabetes mellitus: Secondary | ICD-10-CM | POA: Diagnosis not present

## 2019-07-24 DIAGNOSIS — Z79899 Other long term (current) drug therapy: Secondary | ICD-10-CM | POA: Diagnosis not present

## 2019-07-24 DIAGNOSIS — I5032 Chronic diastolic (congestive) heart failure: Secondary | ICD-10-CM | POA: Insufficient documentation

## 2019-07-24 DIAGNOSIS — M3500 Sicca syndrome, unspecified: Secondary | ICD-10-CM | POA: Diagnosis not present

## 2019-07-24 DIAGNOSIS — E039 Hypothyroidism, unspecified: Secondary | ICD-10-CM | POA: Diagnosis not present

## 2019-07-24 DIAGNOSIS — Z8249 Family history of ischemic heart disease and other diseases of the circulatory system: Secondary | ICD-10-CM | POA: Diagnosis not present

## 2019-07-24 DIAGNOSIS — J8410 Pulmonary fibrosis, unspecified: Secondary | ICD-10-CM | POA: Diagnosis not present

## 2019-07-24 DIAGNOSIS — I110 Hypertensive heart disease with heart failure: Secondary | ICD-10-CM | POA: Insufficient documentation

## 2019-07-24 MED FILL — BISOPROLOL FUMARATE 5 MG TA: 5 | 30 days supply | Qty: 30 | Fill #0

## 2019-07-24 NOTE — Progress Notes (Signed)
Heart Failure Clinic Note  Date:  07/24/2019   ID:  Karen [name], DOB 08-Oct-1970, MRN [id number]  Location: Home  Provider location: [facility] Type of Visit: Established patient  PCP:  [name], MD  Cardiologist:  Mertie Moores, MD Primary HF: Karen [name]  Chief Complaint: Heart Failure follow-up   History of Present Illness:  HPI: Karen [name] is a 49 y.o. female Karen [name] at [facility] (with Karen [name]) with a history of Sjogren's syndrome, multinodular goiter, HTN and recently diagnosed scleroderma referred by Dr. Gavin Pound for evaluation of pulmonary HTN in the setting of scleroderma.  RHC in 5/19 with minimally elevated pressures and hi-res CT which showed ILD. Follows with Dr. Vaughan Browner . F/u CT in 2/20 showed stable ILD  We saw her in 9/20 markedly SOB and ReDS very high @ 49%. CXR with mild pulmonary vascular congestion. ESR normal. Started lasix 40 daily and kcl 20 daily. Fluid got much better. Then switched to lasix 40 mg MWF but that wasn't enough so lasix increased to 5 days per week.   Returns for f/u. Feeling better. Volume status better controlled on lasix 40 mg 5days/week. Going to pulmonary rehab 2x/week. Very mild edema. Can do basic exercises without to much problem. Unable to do whole 6MW without stopping. Back to work FT.   Studies:  Echo 11/18/18: EF hyperdynamic 65-70% RV normal   Echo 5/19: EF 60-65% grade I DD RV normal. Mild TR.   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 Aortic atherosclerosis, three-vessel coronary artery disease.  CT high-resolution 06/27/2018-stable interstitial lung disease. I reviewed the images personally.  Hi res CT 02/05/19: stable ILD    PFTs  FENO 09/06/2017-unable to complete  07/27/2017 FVC 1.65 (54%), FEV1 1.53 (62%], F/F 93, TLC 50, DLCO 44%, DLCO/VA 131%  10/25/2017 FVC 1.51 [50%], FEV1 1.26 [51%], F/F 83  01/29/2018 FVC 1.69 [5%), FEV1 1.50 [61%], F/F 89, DLCO 10.57 [41%]  07/01/2018 FVC 1.57 [52%), FEV1 1.47 [60%], F/F 93, DLCO 12.54 [59%)  01/06/2019 FVC 1.58 [52%], FEV1 1.46 [60%],F/F 92, TLC 2.62 [50%], DLCO 9.16 [42%] Severe restriction and diffusion impairment. FVC is stable but DLCO has worsened.  6-minute walk  10/23/2017- 144 m Post walk heart rate, stats 94, 91%  02/04/2018-249 m Post walk heart rate, sats 101, 99%  06/06/2018-212 m Post walk heart rate, sats 86, 92%  01/30/2019-175mPost walk heart rate, sats 83, 98%      MGerlean Ciddenies symptoms worrisome for COVID 19.   Past Medical History:  Diagnosis Date  . GERD (gastroesophageal reflux disease)   . Hypertension   . Hypothyroidism   . ILD ([place]stitial lung disease) ([place] 02/08/2018  . [place]stitial lung disease ([place]   . Multinodular thyroid    benign FNA 08/2017.  .Marland KitchenPerimenopause 02/27/2019  . Scleroderma ([facility]   . Vitamin D deficiency disease 02/27/2019   Past Surgical History:  Procedure Laterality Date  . ABDOMINAL HERNIA REPAIR    . BUNIONECTOMY Bilateral 10/2018  . COLONOSCOPY WITH PROPOFOL N/A 01/02/2019   Procedure: COLONOSCOPY WITH PROPOFOL;  Surgeon: RDaneil Dolin MD;  [facility];  Service: Endoscopy;  Laterality: N/A;  12:30pm  .  ESOPHAGOGASTRODUODENOSCOPY (EGD) WITH PROPOFOL N/A 01/28/2018   erosive reflux esophagitis, patulous EG Junction, no dilation, incomplete EGD due to retained food in stomach. GES thereafter with delayed gastric emptying.   Marland Kitchen RIGHT HEART CATH N/A 09/27/2017   Procedure: RIGHT HEART CATH;  Surgeon: Jolaine Artist, MD;  Location: [place] [facility];  Service: Cardiovascular;  Laterality: N/A;  . UTERINE FIBROID SURGERY       Current Outpatient Medications   Medication Sig Dispense Refill  . bisoprolol (ZEBETA) 5 MG tablet TAKE ONE TABLET BY MOUTH DAILY. 30 tablet 1  . Cholecalciferol (VITAMIN D-3) 5000 units TABS Take 5,000 Units by mouth daily.     Marland Kitchen esomeprazole (NEXIUM) 40 MG capsule Take 40 mg by mouth 2 (two) times daily before a meal.   5  . furosemide (LASIX) 40 MG tablet Take 40 mg by mouth daily. Except Saturdays and Sundays    . metoCLOPramide (REGLAN) 5 MG tablet Take 1 tablet (5 mg total) by mouth daily before breakfast. 30 tablet 5  . Multiple Vitamins-Minerals (MEGA MULTIVITAMIN) POWD Take 1 Scoop by mouth daily. Mix 1 scoop in water    . mycophenolate (CELLCEPT) 500 MG tablet Take 3 tablets by mouth every morning and every evening.    . Nintedanib (OFEV) 150 MG CAPS Take 1 capsule (150 mg total) by mouth 2 (two) times daily. 60 capsule 2  . ondansetron (ZOFRAN) 4 MG tablet Take 1 tablet (4 mg total) by mouth every 8 (eight) hours as needed for nausea or vomiting. 30 tablet 1  . potassium chloride 20 MEQ/15ML (10%) SOLN Take 20 mEq by mouth daily. Except on saturdays and sundays    . progesterone (PROMETRIUM) 200 MG capsule Take 1 capsule (200 mg total) by mouth daily. 30 capsule 3  . spironolactone (ALDACTONE) 25 MG tablet Take 1 tablet (25 mg total) by mouth daily. 30 tablet 6  . sulfamethoxazole-trimethoprim (BACTRIM DS) 800-160 MG tablet Take 1 tablet by mouth daily.    Marland Kitchen thyroid (NP THYROID) 30 MG tablet Take 1 tablet (30 mg total) by mouth daily before breakfast. 30 tablet 3   No current facility-administered medications for this encounter.    Allergies:   Lisinopril   Social History:  The patient  reports that she has never smoked. She has never used smokeless tobacco. She reports that she does not drink alcohol or use drugs.   Family History:  The patient's family history includes Diabetes in her maternal grandfather, maternal uncle, and mother; Hypertension in her brother, father, and sister.   ROS:  Please see the  history of present illness.   All other systems are personally reviewed and negative.   Vitals:   07/24/19 1447  BP: 114/88  Pulse: 93  SpO2: 97%  Weight: 60.6 kg (133 lb 9.6 oz)    Exam:   General:  Well appearing. No resp difficulty HEENT: normal Neck: supple. JVP 7 Carotids 2+ bilat; no bruits. No lymphadenopathy or thryomegaly appreciated. Cor: PMI nondisplaced. Regular rate & rhythm. No rubs, gallops or murmurs. Prominent P2 Lungs: clear with decreased BS Abdomen: soft, nontender, nondistended. No hepatosplenomegaly. No bruits or masses. Good bowel sounds. Extremities: no cyanosis, clubbing, rash, edema Neuro: alert & orientedx3, cranial nerves grossly intact. moves all 4 extremities w/o difficulty. Affect pleasant   Recent Labs: 01/30/2019: B Natriuretic Peptide 199.4 06/05/2019: TSH 0.72 07/03/2019: ALT 9; ALT 9; BUN 11; Creatinine, Ser 0.68; Hemoglobin 11.5; Platelets 230.0; Potassium 3.7; Sodium 138  Personally reviewed  Wt Readings from Last 3 Encounters:  07/24/19 60.6 kg (133 lb 9.6 oz)  07/15/19 61.1 kg (134 lb 11.2 oz)  07/03/19 60.4 kg (133 lb 3.2 oz)      ASSESSMENT AND PLAN:  1. Chronic diastolic HF/mild PAH-  - Fluid status improved on lasix 40 M-F + PRN - RHC 5/19 with minimally elevated PA pressures  - Echo 5/19: EF 60-65% grade I DD RV normal. Mild TR.  - Echo 11/18/18: EF hyperdynamic 65-70% RV normal Grade 1 DD - In setting of scleroderma I think she will need a repeat RHC  to make sure Harlan isn't progressing. I would have low threshhold to add selective pulmonary artery vasodilators   2. HTN - Blood pressure well controlled. Continue current regimen.  3. Scleroderma - Followed by Dr Trudie Reed - No change currently  4. Pulmonary fibrosis - High res CT chest c/w with ILD. Repeat stable 02/05/19 (Reviewed with her personally) - Followed by Dr Vaughan Browner - Continue Ofev & Cellcept  Signed, Glori Bickers, MD  07/24/2019 3:02 PM  [place] [address] Heart and [place] [address] [phone] (office) [phone] (fax)

## 2019-07-24 NOTE — Patient Instructions (Signed)
Heart Catheterization on Thur 4/30, see instructions below  Please call our office in July to schedule your follow up appointment   CATHETERIZATION INSTRUCTIONS:  You are scheduled for a Cardiac Catheterization on Friday, April 30 with Dr. Glori Bickers.  1. Please arrive at the [facility] (Main Entrance A) at [facility]: [address] [place],  [id number] at 10:00 AM (This time is two hours before your procedure to ensure your preparation). Free valet parking service is available.   Special note: Every effort is made to have your procedure done on time. Please understand that emergencies sometimes delay scheduled procedures.  2. Diet: Do not eat solid foods after midnight.  The patient may have clear liquids until 5am upon the day of the procedure.  3. COVID TEST:  THURSDAY April 29TH AT 11:30 (AFTER YOUR REHAB)  4. Medication instructions in preparation for your procedure:  DO NOT TAKE FUROSEMIDE OR SPIRONOLACTONE FRI 4/30 AM  On the morning of your procedure you may take your morning medicines NOT listed above.  You may use sips of water.  5. Plan for one night stay--bring personal belongings. 6. Bring a current list of your medications and current insurance cards. 7. You MUST have a responsible person to drive you home. 8. Someone MUST be with you the first 24 hours after you arrive home or your discharge will be delayed. 9. Please wear clothes that are easy to get on and off and wear slip-on shoes.  Thank you for allowing [place] to care for you!   -- [facility]

## 2019-07-24 NOTE — Progress Notes (Signed)
Daily Session Note  Patient Details  Name: Karen [name] MRN: [id number] Date of Birth: 01-31-1971 Referring Provider:     Pulmonary Rehab Walk Test from 06/19/2019 in [place]  Referring Provider  Dr. Vaughan Browner      Encounter Date: 07/24/2019  Check In: Session Check In - 07/24/19 1121      Check-In   Supervising physician immediately available to respond to emergencies  [facility] immediately available    Physician(s)  Dr. Darrick Meigs    Location  [facility]    Staff Present  Rosebud Poles, RN, Bjorn Loser, MS, Exercise Physiologist;Jimmie Rueter Ysidro Evert, RN    Virtual Visit  No    Medication changes reported      No    Fall or balance concerns reported     No    Tobacco Cessation  No Change    Warm-up and Cool-down  Performed as group-led instruction    Resistance Training Performed  Yes    VAD Patient?  No    PAD/SET Patient?  No      Pain Assessment   Currently in Pain?  No/denies    Multiple Pain Sites  No       Capillary Blood Glucose: No results found for this or any previous visit (from the past 24 hour(s)).    Social History   Tobacco Use  Smoking Status Never Smoker  Smokeless Tobacco Never Used    Goals Met:  Exercise tolerated well No report of cardiac concerns or symptoms Strength training completed today  Goals Unmet:  Not Applicable  Comments: Service time is from 0950 to [place]    Dr. Fransico Him is Medical Director for Cardiac Rehab at [facility].

## 2019-07-24 NOTE — Addendum Note (Signed)
Encounter addended by: Scarlette Calico, RN on: 07/24/2019 3:49 PM  Actions taken: Clinical Note Signed

## 2019-07-28 ENCOUNTER — Other Ambulatory Visit (HOSPITAL_COMMUNITY): Payer: Self-pay | Admitting: *Deleted

## 2019-07-28 DIAGNOSIS — I272 Pulmonary hypertension, unspecified: Secondary | ICD-10-CM

## 2019-07-28 DIAGNOSIS — I5032 Chronic diastolic (congestive) heart failure: Secondary | ICD-10-CM

## 2019-07-28 MED ORDER — SODIUM CHLORIDE 0.9% FLUSH: 3.0000 mL | Freq: Two times a day (BID) | INTRAVENOUS | Status: DC

## 2019-07-29 NOTE — Progress Notes (Signed)
Pulmonary Individual Treatment Plan  Patient Details  Name: Karen [name] MRN: [id number] Date of Birth: 24-Jan-1971 Referring Provider:     Pulmonary Rehab Walk Test from 06/19/2019 in [place]  Referring Provider  Dr. Vaughan Browner      Initial Encounter Date:    Pulmonary Rehab Walk Test from 06/19/2019 in [place]  Date  06/19/19      Visit Diagnosis: Interstitial lung disease ([place])  Patient's Home Medications on Admission:   Current Outpatient Medications:  .  bisoprolol (ZEBETA) 5 MG tablet, TAKE ONE TABLET BY MOUTH DAILY., Disp: 30 tablet, Rfl: 1 .  Cholecalciferol (VITAMIN D-3) 5000 units TABS, Take 5,000 Units by mouth daily. , Disp: , Rfl:  .  esomeprazole (NEXIUM) 40 MG capsule, Take 40 mg by mouth 2 (two) times daily before a meal. , Disp: , Rfl: 5 .  furosemide (LASIX) 40 MG tablet, Take 40 mg by mouth daily. Except Saturdays and Sundays, Disp: , Rfl:  .  metoCLOPramide (REGLAN) 5 MG tablet, Take 1 tablet (5 mg total) by mouth daily before breakfast., Disp: 30 tablet, Rfl: 5 .  Multiple Vitamins-Minerals (MEGA MULTIVITAMIN) POWD, Take 1 Scoop by mouth daily. Mix 1 scoop in water, Disp: , Rfl:  .  mycophenolate (CELLCEPT) 500 MG tablet, Take 3 tablets by mouth every morning and every evening., Disp: , Rfl:  .  Nintedanib (OFEV) 150 MG CAPS, Take 1 capsule (150 mg total) by mouth 2 (two) times daily., Disp: 60 capsule, Rfl: 2 .  ondansetron (ZOFRAN) 4 MG tablet, Take 1 tablet (4 mg total) by mouth every 8 (eight) hours as needed for nausea or vomiting., Disp: 30 tablet, Rfl: 1 .  potassium chloride 20 MEQ/15ML (10%) SOLN, Take 20 mEq by mouth daily. Except on saturdays and sundays, Disp: , Rfl:  .  progesterone (PROMETRIUM) 200 MG capsule, Take 1 capsule (200 mg total) by mouth daily., Disp: 30 capsule, Rfl: 3 .  spironolactone (ALDACTONE) 25 MG tablet, Take 1 tablet (25 mg total) by mouth daily., Disp: 30 tablet,  Rfl: 6 .  sulfamethoxazole-trimethoprim (BACTRIM DS) 800-160 MG tablet, Take 1 tablet by mouth daily., Disp: , Rfl:  .  thyroid (NP THYROID) 30 MG tablet, Take 1 tablet (30 mg total) by mouth daily before breakfast., Disp: 30 tablet, Rfl: 3  Past Medical History: Past Medical History:  Diagnosis Date  . GERD (gastroesophageal reflux disease)   . Hypertension   . Hypothyroidism   . ILD (interstitial lung disease) ([place]) 02/08/2018  . Interstitial lung disease ([place])   . Multinodular thyroid    benign FNA 08/2017.  [facility] Kitchen Perimenopause 02/27/2019  . Scleroderma ([place])   . Vitamin D deficiency disease 02/27/2019    Tobacco Use: Social History   Tobacco Use  Smoking Status Never Smoker  Smokeless Tobacco Never Used    Labs: Recent Review Flowsheet Data    Labs for ITP Cardiac and Pulmonary Rehab Latest Ref Rng & Units 03/22/2017 09/27/2017 09/27/2017   Cholestrol 100 - 199 mg/dL 187 - -   LDLCALC 0 - 99 mg/dL 118(H) - -   HDL >39 mg/dL 43 - -   Trlycerides 0 - 149 mg/dL 130 - -   HCO3 20.0 - 28.0 mmol/L - 29.4(H) 29.3(H)   TCO2 22 - 32 mmol/L - 31 31   O2SAT % - 74.0 75.0      Capillary Blood Glucose: Lab Results  Component Value Date   GLUCAP 92 01/28/2018  GLUCAP 61 (L) 01/28/2018   GLUCAP 67 (L) 01/28/2018   GLUCAP 25 (LL) 01/28/2018   GLUCAP 25 (LL) 01/28/2018     Pulmonary Assessment Scores: Pulmonary Assessment Scores    Row Name 06/19/19 1119 06/19/19 1139       ADL UCSD   ADL Phase  Entry  --    SOB Score total  51  --      CAT Score   CAT Score  19  --      mMRC Score   mMRC Score  --  4      UCSD: Self-administered rating of dyspnea associated with activities of daily living (ADLs) 6-point scale (0 = "not at all" to 5 = "maximal or unable to do because of breathlessness")  Scoring Scores range from 0 to 120.  Minimally important difference is 5 units  CAT: CAT can identify the health impairment of COPD patients and is better correlated with disease  progression.  CAT has a scoring range of zero to 40. The CAT score is classified into four groups of low (less than 10), medium (10 - 20), high (21-30) and very high (31-40) based on the impact level of disease on health status. A CAT score over 10 suggests significant symptoms.  A worsening CAT score could be explained by an exacerbation, poor medication adherence, poor inhaler technique, or progression of COPD or comorbid conditions.  CAT MCID is 2 points  mMRC: mMRC (Modified Medical Research Council) Dyspnea Scale is used to assess the degree of baseline functional disability in patients of respiratory disease due to dyspnea. No minimal important difference is established. A decrease in score of 1 point or greater is considered a positive change.   Pulmonary Function Assessment: Pulmonary Function Assessment - 06/19/19 1032      Breath   Bilateral Breath Sounds  Clear    Shortness of Breath  Yes;Fear of Shortness of Breath;Limiting activity       Exercise Target Goals: Exercise Program Goal: Individual exercise prescription set using results from initial 6 min walk test and THRR while considering  patient's activity barriers and safety.   Exercise Prescription Goal: Initial exercise prescription builds to 30-45 minutes a day of aerobic activity, 2-3 days per week.  Home exercise guidelines will be given to patient during program as part of exercise prescription that the participant will acknowledge.  Activity Barriers & Risk Stratification: Activity Barriers & Cardiac Risk Stratification - 06/19/19 1030      Activity Barriers & Cardiac Risk Stratification   Activity Barriers  Deconditioning;Shortness of Breath       6 Minute Walk: 6 Minute Walk    Row Name 06/19/19 1130         6 Minute Walk   Phase  Initial     Distance  800 feet     Walk Time  6 minutes     # of Rest Breaks  0     MPH  1.5     METS  3.4     RPE  12     Perceived Dyspnea   2     VO2 Peak  12.19       Symptoms  Yes (comment)     Comments  Tiredness, +2 SOB     Resting HR  88 bpm     Resting BP  142/82     Resting Oxygen Saturation   95 %     Exercise Oxygen Saturation  during 6 min walk  92 %     Max Ex. HR  97 bpm     Max Ex. BP  146/82     2 Minute Post BP  124/84       Interval HR   1 Minute HR  95     2 Minute HR  97     3 Minute HR  97     4 Minute HR  95     5 Minute HR  95     6 Minute HR  94     2 Minute Post HR  83     Interval Heart Rate?  Yes       Interval Oxygen   Interval Oxygen?  Yes     Baseline Oxygen Saturation %  95 % RA     1 Minute Oxygen Saturation %  93 % RA     1 Minute Liters of Oxygen  -- RA     2 Minute Oxygen Saturation %  93 %     2 Minute Liters of Oxygen  -- RA     3 Minute Oxygen Saturation %  93 %     3 Minute Liters of Oxygen  -- RA     4 Minute Oxygen Saturation %  92 %     4 Minute Liters of Oxygen  -- RA     5 Minute Oxygen Saturation %  95 %     5 Minute Liters of Oxygen  -- RA     6 Minute Oxygen Saturation %  94 %     6 Minute Liters of Oxygen  -- RA     2 Minute Post Oxygen Saturation %  96 %     2 Minute Post Liters of Oxygen  -- RA        Oxygen Initial Assessment: Oxygen Initial Assessment - 06/19/19 1031      Home Oxygen   Home Oxygen Device  None    Sleep Oxygen Prescription  None    Home Exercise Oxygen Prescription  None    Home at Rest Exercise Oxygen Prescription  None    Compliance with Home Oxygen Use  No       Oxygen Re-Evaluation: Oxygen Re-Evaluation    Row Name 07/29/19 0658             Program Oxygen Prescription   Program Oxygen Prescription  None         Home Oxygen   Home Oxygen Device  None       Home Exercise Oxygen Prescription  None       Home at Rest Exercise Oxygen Prescription  None       Compliance with Home Oxygen Use  Yes         Goals/Expected Outcomes   Short Term Goals  To learn and exhibit compliance with exercise, home and travel O2 prescription;To learn and  understand importance of monitoring SPO2 with pulse oximeter and demonstrate accurate use of the pulse oximeter.;To learn and understand importance of maintaining oxygen saturations>88%;To learn and demonstrate proper pursed lip breathing techniques or other breathing techniques.;To learn and demonstrate proper use of respiratory medications       Long  Term Goals  Exhibits compliance with exercise, home and travel O2 prescription;Verbalizes importance of monitoring SPO2 with pulse oximeter and return demonstration;Maintenance of O2 saturations>88%;Exhibits proper breathing techniques, such as pursed lip breathing or other method taught during program session;Compliance with respiratory medication;Demonstrates proper use of MDI's  Goals/Expected Outcomes  compliance          Oxygen Discharge (Final Oxygen Re-Evaluation): Oxygen Re-Evaluation - 07/29/19 0658      Program Oxygen Prescription   Program Oxygen Prescription  None      Home Oxygen   Home Oxygen Device  None    Home Exercise Oxygen Prescription  None    Home at Rest Exercise Oxygen Prescription  None    Compliance with Home Oxygen Use  Yes      Goals/Expected Outcomes   Short Term Goals  To learn and exhibit compliance with exercise, home and travel O2 prescription;To learn and understand importance of monitoring SPO2 with pulse oximeter and demonstrate accurate use of the pulse oximeter.;To learn and understand importance of maintaining oxygen saturations>88%;To learn and demonstrate proper pursed lip breathing techniques or other breathing techniques.;To learn and demonstrate proper use of respiratory medications    Long  Term Goals  Exhibits compliance with exercise, home and travel O2 prescription;Verbalizes importance of monitoring SPO2 with pulse oximeter and return demonstration;Maintenance of O2 saturations>88%;Exhibits proper breathing techniques, such as pursed lip breathing or other method taught during program  session;Compliance with respiratory medication;Demonstrates proper use of MDI's    Goals/Expected Outcomes  compliance       Initial Exercise Prescription: Initial Exercise Prescription - 06/19/19 1100      Date of Initial Exercise RX and Referring Provider   Date  06/19/19    Referring Provider  Dr. Vaughan Browner    Expected Discharge Date  08/20/19      Recumbant Bike   Level  1    Watts  5    Minutes  15    METs  2.29      NuStep   Level  2    SPM  75    Minutes  15    METs  1.5      Prescription Details   Frequency (times per week)  1    Duration  Progress to 30 minutes of continuous aerobic without signs/symptoms of physical distress      Intensity   THRR 40-80% of Max Heartrate  68-136    Ratings of Perceived Exertion  11-13    Perceived Dyspnea  0-4      Progression   Progression  Continue to progress workloads to maintain intensity without signs/symptoms of physical distress.      Resistance Training   Training Prescription  Yes    Weight  --   Resistance Bands- Orange    Reps  10-15       Perform Capillary Blood Glucose checks as needed.  Exercise Prescription Changes: Exercise Prescription Changes    Row Name 07/15/19 1100             Response to Exercise   Blood Pressure (Admit)  120/66       Blood Pressure (Exercise)  132/76       Blood Pressure (Exit)  114/64       Heart Rate (Admit)  87 bpm       Heart Rate (Exercise)  90 bpm       Heart Rate (Exit)  91 bpm       Oxygen Saturation (Admit)  94 %       Oxygen Saturation (Exercise)  93 %       Oxygen Saturation (Exit)  93 %       Rating of Perceived Exertion (Exercise)  13       Perceived Dyspnea (  Exercise)  3       Duration  Continue with 30 min of aerobic exercise without signs/symptoms of physical distress.       Intensity  -- 40-80% HRR         Progression   Progression  Continue to progress workloads to maintain intensity without signs/symptoms of physical distress.         Resistance  Training   Training Prescription  Yes       Reps  10-15       Time  10 Minutes         Recumbant Bike   Level  1       Watts  5       Minutes  15         NuStep   Level  2       Minutes  15       METs  1.8          Exercise Comments:   Exercise Goals and Review: Exercise Goals    Row Name 06/19/19 1147 07/29/19 0658           Exercise Goals   Increase Physical Activity  Yes  Yes      Intervention  Provide advice, education, support and counseling about physical activity/exercise needs.;Develop an individualized exercise prescription for aerobic and resistive training based on initial evaluation findings, risk stratification, comorbidities and participant's personal goals.  Provide advice, education, support and counseling about physical activity/exercise needs.;Develop an individualized exercise prescription for aerobic and resistive training based on initial evaluation findings, risk stratification, comorbidities and participant's personal goals.      Expected Outcomes  Short Term: Attend rehab on a regular basis to increase amount of physical activity.;Long Term: Add in home exercise to make exercise part of routine and to increase amount of physical activity.;Long Term: Exercising regularly at least 3-5 days a week.  Short Term: Attend rehab on a regular basis to increase amount of physical activity.;Long Term: Add in home exercise to make exercise part of routine and to increase amount of physical activity.;Long Term: Exercising regularly at least 3-5 days a week.      Increase Strength and Stamina  Yes  Yes      Intervention  Provide advice, education, support and counseling about physical activity/exercise needs.;Develop an individualized exercise prescription for aerobic and resistive training based on initial evaluation findings, risk stratification, comorbidities and participant's personal goals.  Provide advice, education, support and counseling about physical  activity/exercise needs.;Develop an individualized exercise prescription for aerobic and resistive training based on initial evaluation findings, risk stratification, comorbidities and participant's personal goals.      Expected Outcomes  Short Term: Increase workloads from initial exercise prescription for resistance, speed, and METs.;Short Term: Perform resistance training exercises routinely during rehab and add in resistance training at home;Long Term: Improve cardiorespiratory fitness, muscular endurance and strength as measured by increased METs and functional capacity (6MWT)  Short Term: Increase workloads from initial exercise prescription for resistance, speed, and METs.;Short Term: Perform resistance training exercises routinely during rehab and add in resistance training at home;Long Term: Improve cardiorespiratory fitness, muscular endurance and strength as measured by increased METs and functional capacity (6MWT)      Able to understand and use rate of perceived exertion (RPE) scale  Yes  Yes      Intervention  Provide education and explanation on how to use RPE scale  Provide education and explanation on how to use RPE  scale      Expected Outcomes  Short Term: Able to use RPE daily in rehab to express subjective intensity level;Long Term:  Able to use RPE to guide intensity level when exercising independently  Short Term: Able to use RPE daily in rehab to express subjective intensity level;Long Term:  Able to use RPE to guide intensity level when exercising independently      Able to understand and use Dyspnea scale  Yes  Yes      Intervention  Provide education and explanation on how to use Dyspnea scale  Provide education and explanation on how to use Dyspnea scale      Expected Outcomes  Short Term: Able to use Dyspnea scale daily in rehab to express subjective sense of shortness of breath during exertion;Long Term: Able to use Dyspnea scale to guide intensity level when exercising  independently  Short Term: Able to use Dyspnea scale daily in rehab to express subjective sense of shortness of breath during exertion;Long Term: Able to use Dyspnea scale to guide intensity level when exercising independently      Knowledge and understanding of Target Heart Rate Range (THRR)  Yes  Yes      Intervention  Provide education and explanation of THRR including how the numbers were predicted and where they are located for reference  Provide education and explanation of THRR including how the numbers were predicted and where they are located for reference      Expected Outcomes  Short Term: Able to state/look up THRR;Long Term: Able to use THRR to govern intensity when exercising independently;Short Term: Able to use daily as guideline for intensity in rehab  Short Term: Able to state/look up THRR;Long Term: Able to use THRR to govern intensity when exercising independently;Short Term: Able to use daily as guideline for intensity in rehab      Able to check pulse independently  Yes  --      Intervention  Provide education and demonstration on how to check pulse in carotid and radial arteries.;Review the importance of being able to check your own pulse for safety during independent exercise  --      Expected Outcomes  Short Term: Able to explain why pulse checking is important during independent exercise;Long Term: Able to check pulse independently and accurately  --      Understanding of Exercise Prescription  Yes  Yes      Intervention  Provide education, explanation, and written materials on patient's individual exercise prescription  Provide education, explanation, and written materials on patient's individual exercise prescription      Expected Outcomes  Short Term: Able to explain program exercise prescription;Long Term: Able to explain home exercise prescription to exercise independently  Short Term: Able to explain program exercise prescription;Long Term: Able to explain home exercise  prescription to exercise independently         Exercise Goals Re-Evaluation : Exercise Goals Re-Evaluation    Row Name 07/29/19 0658             Exercise Goal Re-Evaluation   Exercise Goals Review  Increase Physical Activity;Increase Strength and Stamina;Able to understand and use rate of perceived exertion (RPE) scale;Able to understand and use Dyspnea scale;Knowledge and understanding of Target Heart Rate Range (THRR);Understanding of Exercise Prescription       Comments  Pt has attended 4 exercise sessions. Pt only attends once a week due to work schedule. Pt was extremely deconditioned upon entering program and is progressing slowly. Pt currently  exercises at 1.5 METs on the stepper. Will continue to monitor and progress as able.       Expected Outcomes  Through exercise at rehab and at home, the patient will decrease shortness of breath with daily activities and feel confident in carrying out an exercise regime at home.          Discharge Exercise Prescription (Final Exercise Prescription Changes): Exercise Prescription Changes - 07/15/19 1100      Response to Exercise   Blood Pressure (Admit)  120/66    Blood Pressure (Exercise)  132/76    Blood Pressure (Exit)  114/64    Heart Rate (Admit)  87 bpm    Heart Rate (Exercise)  90 bpm    Heart Rate (Exit)  91 bpm    Oxygen Saturation (Admit)  94 %    Oxygen Saturation (Exercise)  93 %    Oxygen Saturation (Exit)  93 %    Rating of Perceived Exertion (Exercise)  13    Perceived Dyspnea (Exercise)  3    Duration  Continue with 30 min of aerobic exercise without signs/symptoms of physical distress.    Intensity  --   40-80% HRR     Progression   Progression  Continue to progress workloads to maintain intensity without signs/symptoms of physical distress.      Resistance Training   Training Prescription  Yes    Reps  10-15    Time  10 Minutes      Recumbant Bike   Level  1    Watts  5    Minutes  15      NuStep    Level  2    Minutes  15    METs  1.8       Nutrition:  Target Goals: Understanding of nutrition guidelines, daily intake of sodium '1500mg'$ , cholesterol '200mg'$ , calories 30% from fat and 7% or less from saturated fats, daily to have 5 or more servings of fruits and vegetables.  Biometrics: Pre Biometrics - 06/19/19 1101      Pre Biometrics   Grip Strength  17 kg        Nutrition Therapy Plan and Nutrition Goals:   Nutrition Assessments:   Nutrition Goals Re-Evaluation:   Nutrition Goals Discharge (Final Nutrition Goals Re-Evaluation):   Psychosocial: Target Goals: Acknowledge presence or absence of significant depression and/or stress, maximize coping skills, provide positive support system. Participant is able to verbalize types and ability to use techniques and skills needed for reducing stress and depression.  Initial Review & Psychosocial Screening: Initial Psych Review & Screening - 06/19/19 1034      Initial Review   Current issues with  None Identified      Family Dynamics   Good Support System?  Yes    Comments  is dealing with chronic illnesses, mother is very supportive, for now she does not want to see a counselor, discussed that would be an option if she feels she needs help.      Barriers   Psychosocial barriers to participate in program  There are no identifiable barriers or psychosocial needs.      Screening Interventions   Interventions  Encouraged to exercise       Quality of Life Scores:  Scores of 19 and below usually indicate a poorer quality of life in these areas.  A difference of  2-3 points is a clinically meaningful difference.  A difference of 2-3 points in the total score of the  Quality of Life Index has been associated with significant improvement in overall quality of life, self-image, physical symptoms, and general health in studies assessing change in quality of life.  PHQ-9: Recent Review Flowsheet Data    Depression screen [facility]  2/9 06/19/2019 08/30/2017   Decreased Interest 0 0   Down, Depressed, Hopeless 0 0   PHQ - 2 Score 0 0   Altered sleeping 1  -   Tired, decreased energy 2 -   Change in appetite 0 -   Feeling bad or failure about yourself  0 -   Trouble concentrating 0 -   Moving slowly or fidgety/restless 0 -   Suicidal thoughts 0 -   PHQ-9 Score 3 -   Difficult doing work/chores Somewhat difficult -     Interpretation of Total Score  Total Score Depression Severity:  1-4 = Minimal depression, 5-9 = Mild depression, 10-14 = Moderate depression, 15-19 = Moderately severe depression, 20-27 = Severe depression   Psychosocial Evaluation and Intervention: Psychosocial Evaluation - 06/19/19 1037      Psychosocial Evaluation & Interventions   Interventions  Encouraged to exercise with the program and follow exercise prescription;Stress management education;Relaxation education    Comments  follow for change in psychosocial issues.    Continue Psychosocial Services   Follow up required by staff       Psychosocial Re-Evaluation: Psychosocial Re-Evaluation    [place] Name 07/28/19 1239             Psychosocial Re-Evaluation   Current issues with  Current Stress Concerns       Comments  Dealing with the stress of having a chronic illness well, her mother is very supportive.       Expected Outcomes  Will continue to handle her stress in a healthy manner, still does not desire a referral to a therapist       Interventions  Encouraged to attend Pulmonary Rehabilitation for the exercise;Stress management education;Relaxation education       Continue Psychosocial Services   Follow up required by staff       Comments  continue to manage stress in a healthy manner         Initial Review   Source of Stress Concerns  Chronic Illness;Unable to perform yard/household activities          Psychosocial Discharge (Final Psychosocial Re-Evaluation): Psychosocial Re-Evaluation - 07/28/19 1239      Psychosocial  Re-Evaluation   Current issues with  Current Stress Concerns    Comments  Dealing with the stress of having a chronic illness well, her mother is very supportive.    Expected Outcomes  Will continue to handle her stress in a healthy manner, still does not desire a referral to a therapist    Interventions  Encouraged to attend Pulmonary Rehabilitation for the exercise;Stress management education;Relaxation education    Continue Psychosocial Services   Follow up required by staff    Comments  continue to manage stress in a healthy manner      Initial Review   Source of Stress Concerns  Chronic Illness;Unable to perform yard/household activities       Education: Education Goals: Education classes will be provided on a weekly basis, covering required topics. Participant will state understanding/return demonstration of topics presented.  Learning Barriers/Preferences: Learning Barriers/Preferences - 06/19/19 1038      Learning Barriers/Preferences   Learning Barriers  None    Learning Preferences  Audio;Computer/Internet;Group Instruction;Individual Instruction;Pictoral;Skilled Demonstration;Verbal Instruction;Video;Written Material  Education Topics: Risk Factor Reduction:  -Group instruction that is supported by a PowerPoint presentation. Instructor discusses the definition of a risk factor, different risk factors for pulmonary disease, and how the heart and lungs work together.     Nutrition for Pulmonary Patient:  -Group instruction provided by PowerPoint slides, verbal discussion, and written materials to support subject matter. The instructor gives an explanation and review of healthy diet recommendations, which includes a discussion on weight management, recommendations for fruit and vegetable consumption, as well as protein, fluid, caffeine, fiber, sodium, sugar, and alcohol. Tips for eating when patients are short of breath are discussed.   Pursed Lip Breathing:  -Group  instruction that is supported by demonstration and informational handouts. Instructor discusses the benefits of pursed lip and diaphragmatic breathing and detailed demonstration on how to preform both.     Oxygen Safety:  -Group instruction provided by PowerPoint, verbal discussion, and written material to support subject matter. There is an overview of "What is Oxygen" and "Why do we need it".  Instructor also reviews how to create a safe environment for oxygen use, the importance of using oxygen as prescribed, and the risks of noncompliance. There is a brief discussion on traveling with oxygen and resources the patient may utilize.   Oxygen Equipment:  -Group instruction provided by [facility] Staff utilizing handouts, written materials, and equipment demonstrations.   Signs and Symptoms:  -Group instruction provided by written material and verbal discussion to support subject matter. Warning signs and symptoms of infection, stroke, and heart attack are reviewed and when to call the physician/911 reinforced. Tips for preventing the spread of infection discussed.   Advanced Directives:  -Group instruction provided by verbal instruction and written material to support subject matter. Instructor reviews Advanced Directive laws and proper instruction for filling out document.   Pulmonary Video:  -Group video education that reviews the importance of medication and oxygen compliance, exercise, good nutrition, pulmonary hygiene, and pursed lip and diaphragmatic breathing for the pulmonary patient.   Exercise for the Pulmonary Patient:  -Group instruction that is supported by a PowerPoint presentation. Instructor discusses benefits of exercise, core components of exercise, frequency, duration, and intensity of an exercise routine, importance of utilizing pulse oximetry during exercise, safety while exercising, and options of places to exercise outside of rehab.     Pulmonary Medications:    -Verbally interactive group education provided by instructor with focus on inhaled medications and proper administration.   Anatomy and Physiology of the Respiratory System and Intimacy:  -Group instruction provided by PowerPoint, verbal discussion, and written material to support subject matter. Instructor reviews respiratory cycle and anatomical components of the respiratory system and their functions. Instructor also reviews differences in obstructive and restrictive respiratory diseases with examples of each. Intimacy, Sex, and Sexuality differences are reviewed with a discussion on how relationships can change when diagnosed with pulmonary disease. Common sexual concerns are reviewed.   PULMONARY REHAB OTHER RESPIRATORY from 07/17/2019 in [place]  Date  07/17/19  Educator  handout      MD DAY -A group question and answer session with a medical doctor that allows participants to ask questions that relate to their pulmonary disease state.   OTHER EDUCATION -Group or individual verbal, written, or video instructions that support the educational goals of the pulmonary rehab program.   Holiday Eating Survival Tips:  -Group instruction provided by PowerPoint slides, verbal discussion, and written materials to support subject matter. The instructor gives  patients tips, tricks, and techniques to help them not only survive but enjoy the holidays despite the onslaught of food that accompanies the holidays.   Knowledge Questionnaire Score: Knowledge Questionnaire Score - 06/19/19 1112      Knowledge Questionnaire Score   Pre Score  15/18       Core Components/Risk Factors/Patient Goals at Admission: Personal Goals and Risk Factors at Admission - 06/19/19 1039      Core Components/Risk Factors/Patient Goals on Admission   Improve shortness of breath with ADL's  Yes    Intervention  Provide education, individualized exercise plan and daily activity  instruction to help decrease symptoms of SOB with activities of daily living.    Expected Outcomes  Short Term: Improve cardiorespiratory fitness to achieve a reduction of symptoms when performing ADLs;Long Term: Be able to perform more ADLs without symptoms or delay the onset of symptoms       Core Components/Risk Factors/Patient Goals Review:  Goals and Risk Factor Review    Row Name 06/19/19 1039 07/28/19 1242           Core Components/Risk Factors/Patient Goals Review   Personal Goals Review  Develop more efficient breathing techniques such as purse lipped breathing and diaphragmatic breathing and practicing self-pacing with activity.;Increase knowledge of respiratory medications and ability to use respiratory devices properly.;Improve shortness of breath with ADL's  Develop more efficient breathing techniques such as purse lipped breathing and diaphragmatic breathing and practicing self-pacing with activity.;Increase knowledge of respiratory medications and ability to use respiratory devices properly.;Improve shortness of breath with ADL's      Review  --  Due to her work schedule is only attending 1 exercise session weekly in pulmonary rehab, is very deconditioned, has attended 4 sessions, can finally complete 30 minutes of exercise at this point.      Expected Outcomes  --  Will continue to become stronger as the workloads are increased on the exercise equipment         Core Components/Risk Factors/Patient Goals at Discharge (Final Review):  Goals and Risk Factor Review - 07/28/19 1242      Core Components/Risk Factors/Patient Goals Review   Personal Goals Review  Develop more efficient breathing techniques such as purse lipped breathing and diaphragmatic breathing and practicing self-pacing with activity.;Increase knowledge of respiratory medications and ability to use respiratory devices properly.;Improve shortness of breath with ADL's    Review  Due to her work schedule is only  attending 1 exercise session weekly in pulmonary rehab, is very deconditioned, has attended 4 sessions, can finally complete 30 minutes of exercise at this point.    Expected Outcomes  Will continue to become stronger as the workloads are increased on the exercise equipment       ITP Comments:   Comments: ITP REVIEW Pt is making expected progress toward pulmonary rehab goals after completing 4 sessions. Recommend continued exercise, life style modification, education, and utilization of breathing techniques to increase stamina and strength and decrease shortness of breath with exertion.

## 2019-07-31 ENCOUNTER — Encounter (HOSPITAL_COMMUNITY)
Admission: RE | Admit: 2019-07-31 | Discharge: 2019-07-31 | Disposition: A | Discharge status: Home / Self Care | Payer: 59 | Source: Ambulatory Visit | Attending: Pulmonary Disease | Admitting: Pulmonary Disease

## 2019-07-31 ENCOUNTER — Other Ambulatory Visit: Payer: Self-pay

## 2019-07-31 VITALS — Wt 132.3 lb

## 2019-07-31 DIAGNOSIS — J849 Interstitial pulmonary disease, unspecified: Secondary | ICD-10-CM

## 2019-07-31 NOTE — Progress Notes (Signed)
Karen [name] 49 y.o. female Nutrition Note  Visit Diagnosis: Interstitial lung disease [facility])   Past Medical History:  Diagnosis Date  . GERD (gastroesophageal reflux disease)   . Hypertension   . Hypothyroidism   . ILD (interstitial lung disease) ([place]) 02/08/2018  . Interstitial lung disease ([place])   . Multinodular thyroid    benign FNA 08/2017.  Marland Kitchen Perimenopause 02/27/2019  . Scleroderma ([place])   . Vitamin D deficiency disease 02/27/2019     Medications reviewed.   Current Outpatient Medications:  .  bisoprolol (ZEBETA) 5 MG tablet, TAKE ONE TABLET BY MOUTH DAILY., Disp: 30 tablet, Rfl: 1 .  Cholecalciferol (VITAMIN D-3) 5000 units TABS, Take 5,000 Units by mouth daily. , Disp: , Rfl:  .  esomeprazole (NEXIUM) 40 MG capsule, Take 40 mg by mouth 2 (two) times daily before a meal. , Disp: , Rfl: 5 .  furosemide (LASIX) 40 MG tablet, Take 40 mg by mouth daily. Except Saturdays and Sundays, Disp: , Rfl:  .  metoCLOPramide (REGLAN) 5 MG tablet, Take 1 tablet (5 mg total) by mouth daily before breakfast., Disp: 30 tablet, Rfl: 5 .  Multiple Vitamins-Minerals (MEGA MULTIVITAMIN) POWD, Take 1 Scoop by mouth daily. Mix 1 scoop in water, Disp: , Rfl:  .  mycophenolate (CELLCEPT) 500 MG tablet, Take 3 tablets by mouth every morning and every evening., Disp: , Rfl:  .  Nintedanib (OFEV) 150 MG CAPS, Take 1 capsule (150 mg total) by mouth 2 (two) times daily., Disp: 60 capsule, Rfl: 2 .  ondansetron (ZOFRAN) 4 MG tablet, Take 1 tablet (4 mg total) by mouth every 8 (eight) hours as needed for nausea or vomiting., Disp: 30 tablet, Rfl: 1 .  potassium chloride 20 MEQ/15ML (10%) SOLN, Take 20 mEq by mouth daily. Except on saturdays and sundays, Disp: , Rfl:  .  progesterone (PROMETRIUM) 200 MG capsule, Take 1 capsule (200 mg total) by mouth daily., Disp: 30 capsule, Rfl: 3 .  spironolactone (ALDACTONE) 25 MG tablet, Take 1 tablet (25 mg total) by mouth daily., Disp: 30 tablet, Rfl: 6 .   sulfamethoxazole-trimethoprim (BACTRIM DS) 800-160 MG tablet, Take 1 tablet by mouth daily., Disp: , Rfl:  .  thyroid (NP THYROID) 30 MG tablet, Take 1 tablet (30 mg total) by mouth daily before breakfast., Disp: 30 tablet, Rfl: 3   Ht Readings from Last 1 Encounters:  07/03/19 5\' 4"  (1.626 m)     Wt Readings from Last 3 Encounters:  07/31/19 132 lb 4.4 oz (60 kg)  07/24/19 133 lb 9.6 oz (60.6 kg)  07/15/19 134 lb 11.2 oz (61.1 kg)     Body mass index is 22.71 kg/m.   Social History   Tobacco Use  Smoking Status Never Smoker  Smokeless Tobacco Never Used      Nutrition Note  Spoke with pt. Nutrition Plan and Nutrition Survey goals reviewed with pt.    Pt with dx of CHF. Per discussion, pt does not use canned/convenience foods often. Pt does not add salt to food. Pt does not eat out frequently.  Pt has been making diet changes by reading labels to limit sodium.  She is interested in decreasing GERD symptoms. She reveals heart burn and feeling full after just a few bites. She typically eats breakfast and lunch but feels too tired and too full to eat an evening meal.  Pt expressed understanding of the information reviewed.    Nutrition Diagnosis Altered GI function related to poor meal timing as evidenced  by pt report of quick onset of fullness and acid reflux after eating  Nutrition Intervention ? Pt's individual nutrition plan reviewed with pt. ? Benefits of adopting healthy diet reviewed with Rate My Plate survey   ? Pt given handouts for: ? Nutrition I class ? Nutrition II class ? GERD NCM ? Continue client-centered nutrition education by RD, as part of interdisciplinary care.  Goal(s) ? Pt to pack dinner and eat before going home in the evening ? Pt to avoid lying down for 3 hours after each meal ? Pt to incorporate fruits, veggies, whole grains, and unsaturated fats as often as possible  Plan:   Will provide client-centered nutrition education as part of  interdisciplinary care  Monitor and evaluate progress toward nutrition goal with team.   Michaele Offer, MS, RDN, LDN

## 2019-07-31 NOTE — Progress Notes (Signed)
Daily Session Note  Patient Details  Name: Karen [name] MRN: [id number] Date of Birth: 28-May-1970 Referring Provider:     Pulmonary Rehab Walk Test from 06/19/2019 in [place]  Referring Provider  Dr. Vaughan Browner      Encounter Date: 07/31/2019  Check In: Session Check In - 07/31/19 1147      Check-In   Supervising physician immediately available to respond to emergencies  [facility] immediately available    Physician(s)  Dr. Marylyn Ishihara    Location  [facility]    Staff Present  Rosebud Poles, RN, Bjorn Loser, MS, Exercise Physiologist    Virtual Visit  No    Medication changes reported      No    Fall or balance concerns reported     No    Tobacco Cessation  No Change    Warm-up and Cool-down  Performed as group-led instruction    Resistance Training Performed  Yes    VAD Patient?  No    PAD/SET Patient?  No      Pain Assessment   Currently in Pain?  No/denies    Multiple Pain Sites  No       Capillary Blood Glucose: No results found for this or any previous visit (from the past 24 hour(s)).  Exercise Prescription Changes - 07/31/19 1200      Response to Exercise   Blood Pressure (Admit)  114/74    Blood Pressure (Exercise)  132/76    Blood Pressure (Exit)  112/64    Heart Rate (Admit)  77 bpm    Heart Rate (Exercise)  97 bpm    Heart Rate (Exit)  89 bpm    Oxygen Saturation (Admit)  94 %    Oxygen Saturation (Exercise)  91 %    Oxygen Saturation (Exit)  93 %    Rating of Perceived Exertion (Exercise)  17    Perceived Dyspnea (Exercise)  3    Duration  Continue with 30 min of aerobic exercise without signs/symptoms of physical distress.    Intensity  THRR unchanged      Progression   Progression  Continue to progress workloads to maintain intensity without signs/symptoms of physical distress.      Resistance Training   Training Prescription  Yes    Weight  orange bands    Reps  10-15    Time  10  Minutes      Interval Training   Interval Training  No      Recumbant Bike   Level  1    Watts  5    Minutes  15      NuStep   Level  2    SPM  80    Minutes  15    METs  1.9       Social History   Tobacco Use  Smoking Status Never Smoker  Smokeless Tobacco Never Used    Goals Met:  Proper associated with RPD/PD & O2 Sat Exercise tolerated well Strength training completed today  Goals Unmet:  Not Applicable  Comments: Service time is from 1000 to 1103    Dr. Fransico Him is Medical Director for Cardiac Rehab at [facility].

## 2019-08-07 ENCOUNTER — Ambulatory Visit (INDEPENDENT_AMBULATORY_CARE_PROVIDER_SITE_OTHER): Payer: 59 | Admitting: Internal Medicine

## 2019-08-07 ENCOUNTER — Encounter (HOSPITAL_COMMUNITY): Payer: 59

## 2019-08-07 ENCOUNTER — Telehealth (HOSPITAL_COMMUNITY): Payer: Self-pay | Admitting: *Deleted

## 2019-08-07 ENCOUNTER — Encounter (INDEPENDENT_AMBULATORY_CARE_PROVIDER_SITE_OTHER): Payer: Self-pay | Admitting: Internal Medicine

## 2019-08-07 DIAGNOSIS — J0100 Acute maxillary sinusitis, unspecified: Secondary | ICD-10-CM | POA: Diagnosis not present

## 2019-08-07 MED ORDER — AMOXICILLIN-POT CLAVULANATE 875-125 MG PO TABS: 1.0000 | ORAL_TABLET | Freq: Two times a day (BID) | ORAL | 0 refills | Status: DC

## 2019-08-07 NOTE — Progress Notes (Signed)
Metrics: Intervention Frequency ACO  Documented Smoking Status Yearly  Screened one or more times in 24 months  Cessation Counseling or  Active cessation medication Past 24 months  Past 24 months   Guideline developer: UpToDate (See UpToDate for funding source) Date Released: 2014       Wellness Office Visit  Subjective:  Patient ID: Karen [name], female    DOB: 08-17-1970  Age: 49 y.o. MRN: [id number]  CC: This is an audio telemedicine visit with the permission of the patient who is at home and I am in my office.  I used 2 identifiers to identify the patient. Nasal and facial congestion.  HPI  She has had the above symptoms for the last 3 to 4 days.  She felt feverish but do not think she had a documented fever.  She has postnasal drainage.  She has nonproductive cough.  She has a painful nose and face area around the cheekbones.  She has had Covid vaccination, second dose was taken almost 2 weeks ago. Past Medical History:  Diagnosis Date  . GERD (gastroesophageal reflux disease)   . Hypertension   . Hypothyroidism   . ILD (interstitial lung disease) ([place]) 02/08/2018  . Interstitial lung disease ([place])   . Multinodular thyroid    benign FNA 08/2017.  Marland Kitchen Perimenopause 02/27/2019  . Scleroderma ([place])   . Vitamin D deficiency disease 02/27/2019      Family History  Problem Relation Age of Onset  . Hypertension Father   . Hypertension Sister   . Hypertension Brother   . Diabetes Maternal Grandfather   . Diabetes Maternal Uncle   . Diabetes Mother   . Colon cancer Neg Hx     Social History   Social History Narrative   Patient is right-handed. She lives alone in one level home, a few steps to enter.CMA [facility].   Social History   Tobacco Use  . Smoking status: Never Smoker  . Smokeless tobacco: Never Used  Substance Use Topics  . Alcohol use: No    Current Meds  Medication Sig  . bisoprolol (ZEBETA) 5 MG tablet TAKE ONE TABLET BY MOUTH DAILY.  Marland Kitchen  Cholecalciferol (VITAMIN D-3) 5000 units TABS Take 5,000 Units by mouth daily.   Marland Kitchen esomeprazole (NEXIUM) 40 MG capsule Take 40 mg by mouth 2 (two) times daily before a meal.   . furosemide (LASIX) 40 MG tablet Take 40 mg by mouth daily. Except Saturdays and Sundays  . metoCLOPramide (REGLAN) 5 MG tablet Take 1 tablet (5 mg total) by mouth daily before breakfast.  . Multiple Vitamins-Minerals (MEGA MULTIVITAMIN) POWD Take 1 Scoop by mouth daily. Mix 1 scoop in water  . mycophenolate (CELLCEPT) 500 MG tablet Take 3 tablets by mouth every morning and every evening.  . Nintedanib (OFEV) 150 MG CAPS Take 1 capsule (150 mg total) by mouth 2 (two) times daily.  . ondansetron (ZOFRAN) 4 MG tablet Take 1 tablet (4 mg total) by mouth every 8 (eight) hours as needed for nausea or vomiting.  . potassium chloride 20 MEQ/15ML (10%) SOLN Take 20 mEq by mouth daily. Except on saturdays and sundays  . progesterone (PROMETRIUM) 200 MG capsule Take 1 capsule (200 mg total) by mouth daily.  Marland Kitchen spironolactone (ALDACTONE) 25 MG tablet Take 1 tablet (25 mg total) by mouth daily.  Marland Kitchen sulfamethoxazole-trimethoprim (BACTRIM DS) 800-160 MG tablet Take 1 tablet by mouth daily.  Marland Kitchen thyroid (NP THYROID) 30 MG tablet Take 1 tablet (30 mg total) by mouth  daily before breakfast.       Objective:   Today's Vitals: There were no vitals taken for this visit. Vitals with BMI 07/31/2019 07/24/2019 07/15/2019  Height - - -  Weight 132 lbs 4 oz 133 lbs 10 oz 134 lbs 11 oz  BMI - - [id number]  Systolic - 99991111 -  Diastolic - 88 -  Pulse - 93 -     Physical Exam   Her speech is very coarse on the phone but that she does appear to be alert and orientated.    Assessment   1. Acute non-recurrent maxillary sinusitis       Tests ordered No orders of the defined types were placed in this encounter.    Plan: 1. Based on her symptoms, I think she probably does have a sinusitis and I have sent a prescription for antibiotics.  I  have also recommended Flonase nasal spray over-the-counter as well as allergy medicine such as Claritin, Allegra or Zyrtec. 2. If she does not improve in about a week's time, she will let us know. 3. This phone call lasted 5 minutes and 10 seconds.   Meds ordered this encounter  Medications  . amoxicillin-clavulanate (AUGMENTIN) 875-125 MG tablet    Sig: Take 1 tablet by mouth 2 (two) times daily.    Dispense:  20 tablet    Refill:  0    Raymie Trani Luther Parody, MD

## 2019-08-07 NOTE — Telephone Encounter (Signed)
Patient called out for her pulmonary rehab exercise class, she voice was very hoarse and has a sore throat, afebrile, has an appointment to see her pulmonologist today.

## 2019-08-12 ENCOUNTER — Encounter: Payer: Self-pay | Admitting: Internal Medicine

## 2019-08-12 ENCOUNTER — Other Ambulatory Visit: Payer: Self-pay | Admitting: Internal Medicine

## 2019-08-12 DIAGNOSIS — K3184 Gastroparesis: Secondary | ICD-10-CM

## 2019-08-12 MED FILL — NP THYROID 30 MG TABLET: 30 | 30 days supply | Qty: 30 | Fill #0

## 2019-08-12 MED FILL — SPIRONOLACTONE 25 MG TABS: 25 | 30 days supply | Qty: 30 | Fill #0

## 2019-08-12 NOTE — Telephone Encounter (Signed)
Received refill request for metoclopramide 5mg  tab 1 po before breakfast. Patient just got last refill from pharmacy

## 2019-08-13 MED ORDER — METOCLOPRAMIDE HCL 5 MG PO TABS: 5.0000 mg | ORAL_TABLET | Freq: Every day | ORAL | 5 refills | Status: DC

## 2019-08-13 MED FILL — METOCLOPRAMIDE 5 MG TABLET: 5 | 30 days supply | Qty: 30 | Fill #0

## 2019-08-13 NOTE — Telephone Encounter (Signed)
Looks like patient saw [facility] in March 2021. I don't believe we referred her there? Needs to decide where she will continue her care. Looks like she is to follow-up with them in 3 months. She needs to obtain refills from there if she is going to be seen by them.

## 2019-08-13 NOTE — Telephone Encounter (Signed)
Completed Reglan refill.

## 2019-08-13 NOTE — Addendum Note (Signed)
Addended by: Annitta Needs on: 08/13/2019 11:27 AM   Modules accepted: Orders

## 2019-08-13 NOTE — Telephone Encounter (Signed)
Spoke with pt. Pt goes to [facility] to see the doctors only as needed. [facility] is trying to get pt on the Lung transplant list. Pt states that [place] is still her primary GI office.

## 2019-08-14 ENCOUNTER — Other Ambulatory Visit: Payer: Self-pay

## 2019-08-14 ENCOUNTER — Encounter (HOSPITAL_COMMUNITY)
Admission: RE | Admit: 2019-08-14 | Discharge: 2019-08-14 | Disposition: A | Discharge status: Home / Self Care | Payer: 59 | Source: Ambulatory Visit | Attending: Pulmonary Disease | Admitting: Pulmonary Disease

## 2019-08-14 DIAGNOSIS — J849 Interstitial pulmonary disease, unspecified: Secondary | ICD-10-CM | POA: Diagnosis not present

## 2019-08-14 NOTE — Progress Notes (Signed)
I have reviewed a Home Exercise Prescription with [name] . Karen [name] is  currently exercising at home.  The patient was advised to walk, use the stepper, use resistance bands, and do body weight squats 2 days a week for 30-45 minutes.  [name] and I discussed how to progress their exercise prescription.  The patient stated that their goals were to get stronger.  The patient stated that they understand the exercise prescription.  We reviewed exercise guidelines, target heart rate during exercise, RPE Scale, weather conditions, NTG use, endpoints for exercise, warmup and cool down.  Patient is encouraged to come to me with any questions. I will continue to follow up with the patient to assist them with progression and safety.

## 2019-08-14 NOTE — Progress Notes (Signed)
Daily Session Note  Patient Details  Name: Karen [name] MRN: [id number] Date of Birth: 07-19-1970 Referring Provider:     Pulmonary Rehab Walk Test from 06/19/2019 in [place]  Referring Provider  Dr. Vaughan Browner      Encounter Date: 08/14/2019  Check In: Session Check In - 08/14/19 1127      Check-In   Supervising physician immediately available to respond to emergencies  [facility] immediately available    Physician(s)  Dr. Broadus John    Location  [facility]    Staff Present  Hoy Register, MS, Exercise Physiologist;Lisa Ysidro Evert, RN;Joan Leonia Reeves, RN, BSN    Virtual Visit  No    Medication changes reported      No    Fall or balance concerns reported     No    Tobacco Cessation  No Change    Warm-up and Cool-down  Performed on first and last piece of equipment    Resistance Training Performed  Yes    VAD Patient?  No    PAD/SET Patient?  No      Pain Assessment   Currently in Pain?  No/denies    Pain Score  0-No pain    Multiple Pain Sites  No       Capillary Blood Glucose: No results found for this or any previous visit (from the past 24 hour(s)).  Exercise Prescription Changes - 08/14/19 1100      Response to Exercise   Blood Pressure (Admit)  128/80    Blood Pressure (Exit)  128/80    Heart Rate (Admit)  94 bpm    Heart Rate (Exercise)  113 bpm    Heart Rate (Exit)  93 bpm    Oxygen Saturation (Admit)  97 %    Oxygen Saturation (Exercise)  100 %    Oxygen Saturation (Exit)  100 %    Rating of Perceived Exertion (Exercise)  17    Perceived Dyspnea (Exercise)  3    Duration  Continue with 30 min of aerobic exercise without signs/symptoms of physical distress.    Intensity  THRR unchanged      Progression   Progression  Continue to progress workloads to maintain intensity without signs/symptoms of physical distress.      Resistance Training   Training Prescription  Yes    Weight  orange bands    Reps   10-15    Time  10 Minutes      Interval Training   Interval Training  No      Recumbant Bike   Level  1    Minutes  15      NuStep   Level  2    SPM  80    Minutes  15    METs  1.9      Home Exercise Plan   Plans to continue exercise at  [facility] (comment)    Frequency  Add 2 additional days to program exercise sessions.    Initial Home Exercises Provided  08/14/19       Social History   Tobacco Use  Smoking Status Never Smoker  Smokeless Tobacco Never Used    Goals Met:  Independence with exercise equipment Exercise tolerated well Strength training completed today  Goals Unmet:  Not Applicable  Comments: Service time is from 1005 to 67    Dr. Fransico Him is Medical Director for Cardiac Rehab at [facility].

## 2019-08-19 ENCOUNTER — Other Ambulatory Visit: Payer: Self-pay | Admitting: Internal Medicine

## 2019-08-19 ENCOUNTER — Telehealth: Payer: Self-pay | Admitting: Internal Medicine

## 2019-08-19 NOTE — Telephone Encounter (Signed)
Pt called to say that she is out of her Nexium prescription and tried to get it refilled at [facility], but they are telling her they can't refill it because she had transferred pharmacies. SHe said she doesn't remember transferring pharmacies and the pill bottle has [place] on it. Please advise and call [phone]

## 2019-08-20 NOTE — Telephone Encounter (Signed)
Left a detailed message for pt. I called the pharmacy and they received a new RX for Nexium yesterday 08/19/2019 and can refill it for pt.

## 2019-08-21 ENCOUNTER — Other Ambulatory Visit (HOSPITAL_COMMUNITY): Payer: Self-pay | Admitting: Nurse Practitioner

## 2019-08-21 ENCOUNTER — Other Ambulatory Visit: Payer: Self-pay

## 2019-08-21 ENCOUNTER — Encounter (HOSPITAL_COMMUNITY)
Admission: RE | Admit: 2019-08-21 | Discharge: 2019-08-21 | Disposition: A | Discharge status: Home / Self Care | Payer: 59 | Source: Ambulatory Visit | Attending: Pulmonary Disease | Admitting: Pulmonary Disease

## 2019-08-21 DIAGNOSIS — J849 Interstitial pulmonary disease, unspecified: Secondary | ICD-10-CM | POA: Diagnosis not present

## 2019-08-21 MED FILL — ESOMEPRAZOLE MAG DR 40 MG C: 40 | 30 days supply | Qty: 60 | Fill #0

## 2019-08-21 NOTE — Telephone Encounter (Signed)
FYI Pt called back stating she didn't get medication. The pharmacy didn't have a new RX from our office. Gave verbal approval for Nexium 40 mg one capsule bid with 3rfs. Per EG, pt was to continue Nexium at her ov 05/2019.

## 2019-08-21 NOTE — Progress Notes (Signed)
Daily Session Note  Patient Details  Name: Karen [name] MRN: [id number] Date of Birth: [birth year] Referring Provider:     Pulmonary Rehab Walk Test from 06/19/2019 in [place]  Referring Provider  Dr. Vaughan Browner      Encounter Date: 08/21/2019  Check In: Session Check In - 08/21/19 1131      Check-In   Supervising physician immediately available to respond to emergencies  [facility] immediately available    Physician(s)  Dr. Erlinda Hong    Location  [facility]    Staff Present  Rosebud Poles, RN, Bjorn Loser, MS, Exercise Physiologist;Lisa Ysidro Evert, RN    Virtual Visit  No    Medication changes reported      No    Fall or balance concerns reported     No    Tobacco Cessation  No Change    Warm-up and Cool-down  Performed as group-led instruction    Resistance Training Performed  Yes    VAD Patient?  No    PAD/SET Patient?  No      Pain Assessment   Currently in Pain?  No/denies    Multiple Pain Sites  No       Capillary Blood Glucose: No results found for this or any previous visit (from the past 24 hour(s)).    Social History   Tobacco Use  Smoking Status Never Smoker  Smokeless Tobacco Never Used    Goals Met:  Independence with exercise equipment Exercise tolerated well Strength training completed today  Goals Unmet:  Not Applicable  Comments: Service time is from 1005 to 1116    Dr. Fransico Him is Medical Director for Cardiac Rehab at [facility].

## 2019-08-21 NOTE — Telephone Encounter (Signed)
Noted, no further recommendations. 

## 2019-08-22 ENCOUNTER — Ambulatory Visit: Payer: 59 | Admitting: Neurology

## 2019-08-27 NOTE — Progress Notes (Signed)
Pulmonary Individual Treatment Plan  Patient Details  Name: Karen [name] MRN: [id number] Date of Birth: 1970/08/16 Referring Provider:     Pulmonary Rehab Walk Test from 06/19/2019 in [facility]  Referring Provider  Dr. Vaughan Browner      Initial Encounter Date:    Pulmonary Rehab Walk Test from 06/19/2019 in [facility]  Date  06/19/19      Visit Diagnosis: Interstitial lung disease ([place])  Patient's Home Medications on Admission:   Current Outpatient Medications:  .  amoxicillin-clavulanate (AUGMENTIN) 875-125 MG tablet, Take 1 tablet by mouth 2 (two) times daily., Disp: 20 tablet, Rfl: 0 .  bisoprolol (ZEBETA) 5 MG tablet, TAKE ONE TABLET BY MOUTH DAILY., Disp: 30 tablet, Rfl: 1 .  Cholecalciferol (VITAMIN D-3) 5000 units TABS, Take 5,000 Units by mouth daily. , Disp: , Rfl:  .  esomeprazole (NEXIUM) 40 MG capsule, TAKE 1 CAPSULE BY MOUTH TWO TIMES DAILY, Disp: 180 capsule, Rfl: 3 .  furosemide (LASIX) 40 MG tablet, Take 40 mg by mouth daily. Except Saturdays and Sundays, Disp: , Rfl:  .  metoCLOPramide (REGLAN) 5 MG tablet, Take 1 tablet (5 mg total) by mouth daily before breakfast., Disp: 30 tablet, Rfl: 5 .  Multiple Vitamins-Minerals (MEGA MULTIVITAMIN) POWD, Take 1 Scoop by mouth daily. Mix 1 scoop in water, Disp: , Rfl:  .  mycophenolate (CELLCEPT) 500 MG tablet, Take 3 tablets by mouth every morning and every evening., Disp: , Rfl:  .  Nintedanib (OFEV) 150 MG CAPS, Take 1 capsule (150 mg total) by mouth 2 (two) times daily., Disp: 60 capsule, Rfl: 2 .  ondansetron (ZOFRAN) 4 MG tablet, Take 1 tablet (4 mg total) by mouth every 8 (eight) hours as needed for nausea or vomiting., Disp: 30 tablet, Rfl: 1 .  potassium chloride 20 MEQ/15ML (10%) SOLN, Take 20 mEq by mouth daily. Except on saturdays and sundays, Disp: , Rfl:  .  progesterone (PROMETRIUM) 200 MG capsule, Take 1 capsule (200 mg total) by mouth daily., Disp: 30  capsule, Rfl: 3 .  spironolactone (ALDACTONE) 25 MG tablet, Take 1 tablet (25 mg total) by mouth daily., Disp: 30 tablet, Rfl: 6 .  sulfamethoxazole-trimethoprim (BACTRIM DS) 800-160 MG tablet, Take 1 tablet by mouth daily., Disp: , Rfl:  .  thyroid (NP THYROID) 30 MG tablet, Take 1 tablet (30 mg total) by mouth daily before breakfast., Disp: 30 tablet, Rfl: 3  Past Medical History: Past Medical History:  Diagnosis Date  . GERD (gastroesophageal reflux disease)   . Hypertension   . Hypothyroidism   . ILD (interstitial lung disease) ([place]) 02/08/2018  . Interstitial lung disease ([place])   . Multinodular thyroid    benign FNA 08/2017.  [facility] Perimenopause 02/27/2019  . Scleroderma ([place])   . Vitamin D deficiency disease 02/27/2019    Tobacco Use: Social History   Tobacco Use  Smoking Status Never Smoker  Smokeless Tobacco Never Used    Labs: Recent Review Flowsheet Data    Labs for ITP Cardiac and Pulmonary Rehab Latest Ref Rng & Units 03/22/2017 09/27/2017 09/27/2017   Cholestrol 100 - 199 mg/dL 187 - -   LDLCALC 0 - 99 mg/dL 118(H) - -   HDL >39 mg/dL 43 - -   Trlycerides 0 - 149 mg/dL 130 - -   HCO3 20.0 - 28.0 mmol/L - 29.4(H) 29.3(H)   TCO2 22 - 32 mmol/L - 31 31   O2SAT % - 74.0 75.0  Capillary Blood Glucose: Lab Results  Component Value Date   GLUCAP 92 01/28/2018   GLUCAP 61 (L) 01/28/2018   GLUCAP 67 (L) 01/28/2018   GLUCAP 25 (LL) 01/28/2018   GLUCAP 25 (LL) 01/28/2018     Pulmonary Assessment Scores: Pulmonary Assessment Scores    Row Name 06/19/19 1119 06/19/19 1139       ADL UCSD   ADL Phase  Entry  --    SOB Score total  51  --      CAT Score   CAT Score  19  --      mMRC Score   mMRC Score  --  4      UCSD: Self-administered rating of dyspnea associated with activities of daily living (ADLs) 6-point scale (0 = "not at all" to 5 = "maximal or unable to do because of breathlessness")  Scoring Scores range from 0 to 120.  Minimally important  difference is 5 units  CAT: CAT can identify the health impairment of COPD patients and is better correlated with disease progression.  CAT has a scoring range of zero to 40. The CAT score is classified into four groups of low (less than 10), medium (10 - 20), high (21-30) and very high (31-40) based on the impact level of disease on health status. A CAT score over 10 suggests significant symptoms.  A worsening CAT score could be explained by an exacerbation, poor medication adherence, poor inhaler technique, or progression of COPD or comorbid conditions.  CAT MCID is 2 points  mMRC: mMRC (Modified Medical Research Council) Dyspnea Scale is used to assess the degree of baseline functional disability in patients of respiratory disease due to dyspnea. No minimal important difference is established. A decrease in score of 1 point or greater is considered a positive change.   Pulmonary Function Assessment: Pulmonary Function Assessment - 06/19/19 1032      Breath   Bilateral Breath Sounds  Clear    Shortness of Breath  Yes;Fear of Shortness of Breath;Limiting activity       Exercise Target Goals: Exercise Program Goal: Individual exercise prescription set using results from initial 6 min walk test and THRR while considering  patient's activity barriers and safety.   Exercise Prescription Goal: Initial exercise prescription builds to 30-45 minutes a day of aerobic activity, 2-3 days per week.  Home exercise guidelines will be given to patient during program as part of exercise prescription that the participant will acknowledge.  Activity Barriers & Risk Stratification: Activity Barriers & Cardiac Risk Stratification - 06/19/19 1030      Activity Barriers & Cardiac Risk Stratification   Activity Barriers  Deconditioning;Shortness of Breath       6 Minute Walk: 6 Minute Walk    Row Name 06/19/19 1130         6 Minute Walk   Phase  Initial     Distance  800 feet     Walk Time  6  minutes     # of Rest Breaks  0     MPH  1.5     METS  3.4     RPE  12     Perceived Dyspnea   2     VO2 Peak  12.19     Symptoms  Yes (comment)     Comments  Tiredness, +2 SOB     Resting HR  88 bpm     Resting BP  142/82     Resting Oxygen Saturation  95 %     Exercise Oxygen Saturation  during 6 min walk  92 %     Max Ex. HR  97 bpm     Max Ex. BP  146/82     2 Minute Post BP  124/84       Interval HR   1 Minute HR  95     2 Minute HR  97     3 Minute HR  97     4 Minute HR  95     5 Minute HR  95     6 Minute HR  94     2 Minute Post HR  83     Interval Heart Rate?  Yes       Interval Oxygen   Interval Oxygen?  Yes     Baseline Oxygen Saturation %  95 % RA     1 Minute Oxygen Saturation %  93 % RA     1 Minute Liters of Oxygen  -- RA     2 Minute Oxygen Saturation %  93 %     2 Minute Liters of Oxygen  -- RA     3 Minute Oxygen Saturation %  93 %     3 Minute Liters of Oxygen  -- RA     4 Minute Oxygen Saturation %  92 %     4 Minute Liters of Oxygen  -- RA     5 Minute Oxygen Saturation %  95 %     5 Minute Liters of Oxygen  -- RA     6 Minute Oxygen Saturation %  94 %     6 Minute Liters of Oxygen  -- RA     2 Minute Post Oxygen Saturation %  96 %     2 Minute Post Liters of Oxygen  -- RA        Oxygen Initial Assessment: Oxygen Initial Assessment - 06/19/19 1031      Home Oxygen   Home Oxygen Device  None    Sleep Oxygen Prescription  None    Home Exercise Oxygen Prescription  None    Home at Rest Exercise Oxygen Prescription  None    Compliance with Home Oxygen Use  No       Oxygen Re-Evaluation: Oxygen Re-Evaluation    Row Name 07/29/19 8413 08/26/19 0727           Program Oxygen Prescription   Program Oxygen Prescription  None  None        Home Oxygen   Home Oxygen Device  None  None      Sleep Oxygen Prescription  --  None      Home Exercise Oxygen Prescription  None  None      Home at Rest Exercise Oxygen Prescription  None  None       Compliance with Home Oxygen Use  Yes  Yes        Goals/Expected Outcomes   Short Term Goals  To learn and exhibit compliance with exercise, home and travel O2 prescription;To learn and understand importance of monitoring SPO2 with pulse oximeter and demonstrate accurate use of the pulse oximeter.;To learn and understand importance of maintaining oxygen saturations>88%;To learn and demonstrate proper pursed lip breathing techniques or other breathing techniques.;To learn and demonstrate proper use of respiratory medications  To learn and exhibit compliance with exercise, home and travel O2 prescription;To learn and understand importance of monitoring SPO2 with pulse oximeter  and demonstrate accurate use of the pulse oximeter.;To learn and understand importance of maintaining oxygen saturations>88%;To learn and demonstrate proper pursed lip breathing techniques or other breathing techniques.;To learn and demonstrate proper use of respiratory medications      Long  Term Goals  Exhibits compliance with exercise, home and travel O2 prescription;Verbalizes importance of monitoring SPO2 with pulse oximeter and return demonstration;Maintenance of O2 saturations>88%;Exhibits proper breathing techniques, such as pursed lip breathing or other method taught during program session;Compliance with respiratory medication;Demonstrates proper use of MDI's  Exhibits compliance with exercise, home and travel O2 prescription;Verbalizes importance of monitoring SPO2 with pulse oximeter and return demonstration;Maintenance of O2 saturations>88%;Exhibits proper breathing techniques, such as pursed lip breathing or other method taught during program session;Compliance with respiratory medication;Demonstrates proper use of MDI's      Goals/Expected Outcomes  compliance  compliance         Oxygen Discharge (Final Oxygen Re-Evaluation): Oxygen Re-Evaluation - 08/26/19 0727      Program Oxygen Prescription   Program Oxygen  Prescription  None      Home Oxygen   Home Oxygen Device  None    Sleep Oxygen Prescription  None    Home Exercise Oxygen Prescription  None    Home at Rest Exercise Oxygen Prescription  None    Compliance with Home Oxygen Use  Yes      Goals/Expected Outcomes   Short Term Goals  To learn and exhibit compliance with exercise, home and travel O2 prescription;To learn and understand importance of monitoring SPO2 with pulse oximeter and demonstrate accurate use of the pulse oximeter.;To learn and understand importance of maintaining oxygen saturations>88%;To learn and demonstrate proper pursed lip breathing techniques or other breathing techniques.;To learn and demonstrate proper use of respiratory medications    Long  Term Goals  Exhibits compliance with exercise, home and travel O2 prescription;Verbalizes importance of monitoring SPO2 with pulse oximeter and return demonstration;Maintenance of O2 saturations>88%;Exhibits proper breathing techniques, such as pursed lip breathing or other method taught during program session;Compliance with respiratory medication;Demonstrates proper use of MDI's    Goals/Expected Outcomes  compliance       Initial Exercise Prescription: Initial Exercise Prescription - 06/19/19 1100      Date of Initial Exercise RX and Referring Provider   Date  06/19/19    Referring Provider  Dr. Vaughan Browner    Expected Discharge Date  08/20/19      Recumbant Bike   Level  1    Watts  5    Minutes  15    METs  2.29      NuStep   Level  2    SPM  75    Minutes  15    METs  1.5      Prescription Details   Frequency (times per week)  1    Duration  Progress to 30 minutes of continuous aerobic without signs/symptoms of physical distress      Intensity   THRR 40-80% of Max Heartrate  68-136    Ratings of Perceived Exertion  11-13    Perceived Dyspnea  0-4      Progression   Progression  Continue to progress workloads to maintain intensity without signs/symptoms of  physical distress.      Resistance Training   Training Prescription  Yes    Weight  --   Resistance Bands- Orange    Reps  10-15       Perform Capillary Blood Glucose checks as needed.  Exercise Prescription Changes: Exercise Prescription Changes    Row Name 07/15/19 1100 07/31/19 1200 08/14/19 1100         Response to Exercise   Blood Pressure (Admit)  120/66  114/74  128/80     Blood Pressure (Exercise)  132/76  132/76  --     Blood Pressure (Exit)  114/64  112/64  128/80     Heart Rate (Admit)  87 bpm  77 bpm  94 bpm     Heart Rate (Exercise)  90 bpm  97 bpm  113 bpm     Heart Rate (Exit)  91 bpm  89 bpm  93 bpm     Oxygen Saturation (Admit)  94 %  94 %  97 %     Oxygen Saturation (Exercise)  93 %  91 %  100 %     Oxygen Saturation (Exit)  93 %  93 %  100 %     Rating of Perceived Exertion (Exercise)  _0 Perceived Dyspnea (Exercise)  _1 Duration  Continue with 30 min of aerobic exercise without signs/symptoms of physical distress.  Continue with 30 min of aerobic exercise without signs/symptoms of physical distress.  Continue with 30 min of aerobic exercise without signs/symptoms of physical distress.     Intensity  -- 40-80% HRR  THRR unchanged  THRR unchanged       Progression   Progression  Continue to progress workloads to maintain intensity without signs/symptoms of physical distress.  Continue to progress workloads to maintain intensity without signs/symptoms of physical distress.  Continue to progress workloads to maintain intensity without signs/symptoms of physical distress.       Resistance Training   Training Prescription  Yes  Yes  Yes     Weight  --  orange bands  orange bands     Reps  10-15  10-15  10-15     Time  10 Minutes  10 Minutes  10 Minutes       Interval Training   Interval Training  --  No  No       Recumbant Bike   Level  _2 Watts  5  5  --     Minutes  _3 NuStep   Level  _4 SPM  --   80  80     Minutes  _5 METs  1.8  1.9  1.9       Home Exercise Plan   Plans to continue exercise at  --  --  [facility] [employer] (comment)     Frequency  --  --  Add 2 additional days to program exercise sessions.     Initial Home Exercises Provided  --  --  08/14/19        Exercise Comments: Exercise Comments    Row Name 08/14/19 1130           Exercise Comments  home exercise complete          Exercise Goals and Review: Exercise Goals    Row Name 06/19/19 1147 07/29/19 0658 08/26/19 0727         Exercise Goals   Increase Physical Activity  Yes  Yes  Yes  Intervention  Provide advice, education, support and counseling about physical activity/exercise needs.;Develop an individualized exercise prescription for aerobic and resistive training based on initial evaluation findings, risk stratification, comorbidities and participant's personal goals.  Provide advice, education, support and counseling about physical activity/exercise needs.;Develop an individualized exercise prescription for aerobic and resistive training based on initial evaluation findings, risk stratification, comorbidities and participant's personal goals.  Provide advice, education, support and counseling about physical activity/exercise needs.;Develop an individualized exercise prescription for aerobic and resistive training based on initial evaluation findings, risk stratification, comorbidities and participant's personal goals.     Expected Outcomes  Short Term: Attend rehab on a regular basis to increase amount of physical activity.;Long Term: Add in home exercise to make exercise part of routine and to increase amount of physical activity.;Long Term: Exercising regularly at least 3-5 days a week.  Short Term: Attend rehab on a regular basis to increase amount of physical activity.;Long Term: Add in home exercise to make exercise part of routine and to increase amount of physical activity.;Long Term:  Exercising regularly at least 3-5 days a week.  Short Term: Attend rehab on a regular basis to increase amount of physical activity.;Long Term: Add in home exercise to make exercise part of routine and to increase amount of physical activity.;Long Term: Exercising regularly at least 3-5 days a week.     Increase Strength and Stamina  Yes  Yes  Yes     Intervention  Provide advice, education, support and counseling about physical activity/exercise needs.;Develop an individualized exercise prescription for aerobic and resistive training based on initial evaluation findings, risk stratification, comorbidities and participant's personal goals.  Provide advice, education, support and counseling about physical activity/exercise needs.;Develop an individualized exercise prescription for aerobic and resistive training based on initial evaluation findings, risk stratification, comorbidities and participant's personal goals.  Provide advice, education, support and counseling about physical activity/exercise needs.;Develop an individualized exercise prescription for aerobic and resistive training based on initial evaluation findings, risk stratification, comorbidities and participant's personal goals.     Expected Outcomes  Short Term: Increase workloads from initial exercise prescription for resistance, speed, and METs.;Short Term: Perform resistance training exercises routinely during rehab and add in resistance training at home;Long Term: Improve cardiorespiratory fitness, muscular endurance and strength as measured by increased METs and functional capacity (6MWT)  Short Term: Increase workloads from initial exercise prescription for resistance, speed, and METs.;Short Term: Perform resistance training exercises routinely during rehab and add in resistance training at home;Long Term: Improve cardiorespiratory fitness, muscular endurance and strength as measured by increased METs and functional capacity (6MWT)  Short Term:  Increase workloads from initial exercise prescription for resistance, speed, and METs.;Short Term: Perform resistance training exercises routinely during rehab and add in resistance training at home;Long Term: Improve cardiorespiratory fitness, muscular endurance and strength as measured by increased METs and functional capacity (6MWT)     Able to understand and use rate of perceived exertion (RPE) scale  Yes  Yes  Yes     Intervention  Provide education and explanation on how to use RPE scale  Provide education and explanation on how to use RPE scale  Provide education and explanation on how to use RPE scale     Expected Outcomes  Short Term: Able to use RPE daily in rehab to express subjective intensity level;Long Term:  Able to use RPE to guide intensity level when exercising independently  Short Term: Able to use RPE daily in rehab to express subjective intensity level;Long Term:  Able to use RPE to guide intensity level when exercising independently  Short Term: Able to use RPE daily in rehab to express subjective intensity level;Long Term:  Able to use RPE to guide intensity level when exercising independently     Able to understand and use Dyspnea scale  Yes  Yes  Yes     Intervention  Provide education and explanation on how to use Dyspnea scale  Provide education and explanation on how to use Dyspnea scale  Provide education and explanation on how to use Dyspnea scale     Expected Outcomes  Short Term: Able to use Dyspnea scale daily in rehab to express subjective sense of shortness of breath during exertion;Long Term: Able to use Dyspnea scale to guide intensity level when exercising independently  Short Term: Able to use Dyspnea scale daily in rehab to express subjective sense of shortness of breath during exertion;Long Term: Able to use Dyspnea scale to guide intensity level when exercising independently  Short Term: Able to use Dyspnea scale daily in rehab to express subjective sense of shortness  of breath during exertion;Long Term: Able to use Dyspnea scale to guide intensity level when exercising independently     Knowledge and understanding of Target Heart Rate Range (THRR)  Yes  Yes  Yes     Intervention  Provide education and explanation of THRR including how the numbers were predicted and where they are located for reference  Provide education and explanation of THRR including how the numbers were predicted and where they are located for reference  Provide education and explanation of THRR including how the numbers were predicted and where they are located for reference     Expected Outcomes  Short Term: Able to state/look up THRR;Long Term: Able to use THRR to govern intensity when exercising independently;Short Term: Able to use daily as guideline for intensity in rehab  Short Term: Able to state/look up THRR;Long Term: Able to use THRR to govern intensity when exercising independently;Short Term: Able to use daily as guideline for intensity in rehab  Short Term: Able to state/look up THRR;Long Term: Able to use THRR to govern intensity when exercising independently;Short Term: Able to use daily as guideline for intensity in rehab     Able to check pulse independently  Yes  --  Yes     Intervention  Provide education and demonstration on how to check pulse in carotid and radial arteries.;Review the importance of being able to check your own pulse for safety during independent exercise  --  Provide education and demonstration on how to check pulse in carotid and radial arteries.;Review the importance of being able to check your own pulse for safety during independent exercise     Expected Outcomes  Short Term: Able to explain why pulse checking is important during independent exercise;Long Term: Able to check pulse independently and accurately  --  Short Term: Able to explain why pulse checking is important during independent exercise;Long Term: Able to check pulse independently and accurately      Understanding of Exercise Prescription  Yes  Yes  Yes     Intervention  Provide education, explanation, and written materials on patient's individual exercise prescription  Provide education, explanation, and written materials on patient's individual exercise prescription  Provide education, explanation, and written materials on patient's individual exercise prescription     Expected Outcomes  Short Term: Able to explain program exercise prescription;Long Term: Able to explain home exercise prescription to exercise independently  Short  Term: Able to explain program exercise prescription;Long Term: Able to explain home exercise prescription to exercise independently  Short Term: Able to explain program exercise prescription;Long Term: Able to explain home exercise prescription to exercise independently        Exercise Goals Re-Evaluation : Exercise Goals Re-Evaluation    Row Name 07/29/19 0658 08/26/19 0728           Exercise Goal Re-Evaluation   Exercise Goals Review  Increase Physical Activity;Increase Strength and Stamina;Able to understand and use rate of perceived exertion (RPE) scale;Able to understand and use Dyspnea scale;Knowledge and understanding of Target Heart Rate Range (THRR);Understanding of Exercise Prescription  Increase Physical Activity;Increase Strength and Stamina;Able to understand and use rate of perceived exertion (RPE) scale;Able to understand and use Dyspnea scale;Knowledge and understanding of Target Heart Rate Range (THRR);Understanding of Exercise Prescription      Comments  Pt has attended 4 exercise sessions. Pt only attends once a week due to work schedule. Pt was extremely deconditioned upon entering program and is progressing slowly. Pt currently exercises at 1.5 METs on the stepper. Will continue to monitor and progress as able.  Pt has attended 7 exercise sessions and will graduate this week. I have strongly encouraged her to get a gym membership once she  graduates, so she does not go back to being sedentary. She has made some progress, she currently exercises at 2.5 METs on the stepper. Will continue to monitor and progress as able.      Expected Outcomes  Through exercise at rehab and at home, the patient will decrease shortness of breath with daily activities and feel confident in carrying out an exercise regime at home.  Through exercise at rehab and at home, the patient will decrease shortness of breath with daily activities and feel confident in carrying out an exercise regime at home.         Discharge Exercise Prescription (Final Exercise Prescription Changes): Exercise Prescription Changes - 08/14/19 1100      Response to Exercise   Blood Pressure (Admit)  128/80    Blood Pressure (Exit)  128/80    Heart Rate (Admit)  94 bpm    Heart Rate (Exercise)  113 bpm    Heart Rate (Exit)  93 bpm    Oxygen Saturation (Admit)  97 %    Oxygen Saturation (Exercise)  100 %    Oxygen Saturation (Exit)  100 %    Rating of Perceived Exertion (Exercise)  17    Perceived Dyspnea (Exercise)  3    Duration  Continue with 30 min of aerobic exercise without signs/symptoms of physical distress.    Intensity  THRR unchanged      Progression   Progression  Continue to progress workloads to maintain intensity without signs/symptoms of physical distress.      Resistance Training   Training Prescription  Yes    Weight  orange bands    Reps  10-15    Time  10 Minutes      Interval Training   Interval Training  No      Recumbant Bike   Level  1    Minutes  15      NuStep   Level  2    SPM  80    Minutes  15    METs  1.9      Home Exercise Plan   Plans to continue exercise at  [facility] (comment)    Frequency  Add 2 additional  days to program exercise sessions.    Initial Home Exercises Provided  08/14/19       Nutrition:  Target Goals: Understanding of nutrition guidelines, daily intake of sodium <1536m, cholesterol <2085m  calories 30% from fat and 7% or less from saturated fats, daily to have 5 or more servings of fruits and vegetables.  Biometrics: Pre Biometrics - 06/19/19 1101      Pre Biometrics   Grip Strength  17 kg        Nutrition Therapy Plan and Nutrition Goals: Nutrition Therapy & Goals - 08/01/19 0650      Nutrition Therapy   Diet  Low Sodium/GERD      Personal Nutrition Goals   Nutrition Goal  Pt to incorporate fruits, veggies, whole grains, and unsaturated fats as often as possible    Personal Goal #2  Pt to avoid lying down for 3 hours after each meal    Personal Goal #3  Pt to pack dinner and eat before going home in the evening      Intervention Plan   Intervention  Prescribe, educate and counsel regarding individualized specific dietary modifications aiming towards targeted core components such as weight, hypertension, lipid management, diabetes, heart failure and other comorbidities.;Nutrition handout(s) given to patient.    Expected Outcomes  Short Term Goal: A plan has been developed with personal nutrition goals set during dietitian appointment.;Long Term Goal: Adherence to prescribed nutrition plan.       Nutrition Assessments:   Nutrition Goals Re-Evaluation: Nutrition Goals Re-Evaluation    RoAdrianame 08/01/19 0653             Goals   Current Weight  132 lb (59.9 kg)       Nutrition Goal  Pt to incorporate fruits, veggies, whole grains, and unsaturated fats as often as possible         Personal Goal #2 Re-Evaluation   Personal Goal #2  Pt to avoid lying down for 3 hours after each meal         Personal Goal #3 Re-Evaluation   Personal Goal #3  Pt to pack dinner and eat before going home in the evening          Nutrition Goals Discharge (Final Nutrition Goals Re-Evaluation): Nutrition Goals Re-Evaluation - 08/01/19 0653      Goals   Current Weight  132 lb (59.9 kg)    Nutrition Goal  Pt to incorporate fruits, veggies, whole grains, and unsaturated fats as  often as possible      Personal Goal #2 Re-Evaluation   Personal Goal #2  Pt to avoid lying down for 3 hours after each meal      Personal Goal #3 Re-Evaluation   Personal Goal #3  Pt to pack dinner and eat before going home in the evening       Psychosocial: Target Goals: Acknowledge presence or absence of significant depression and/or stress, maximize coping skills, provide positive support system. Participant is able to verbalize types and ability to use techniques and skills needed for reducing stress and depression.  Initial Review & Psychosocial Screening: Initial Psych Review & Screening - 06/19/19 1034      Initial Review   Current issues with  None Identified      Family Dynamics   Good Support System?  Yes    Comments  is dealing with chronic illnesses, mother is very supportive, for now she does not want to see a counselor, discussed that would be an  option if she feels she needs help.      Barriers   Psychosocial barriers to participate in program  There are no identifiable barriers or psychosocial needs.      Screening Interventions   Interventions  Encouraged to exercise       Quality of Life Scores:  Scores of 19 and below usually indicate a poorer quality of life in these areas.  A difference of  2-3 points is a clinically meaningful difference.  A difference of 2-3 points in the total score of the Quality of Life Index has been associated with significant improvement in overall quality of life, self-image, physical symptoms, and general health in studies assessing change in quality of life.  PHQ-9: Recent Review Flowsheet Data    Depression screen [facility] 2/9 06/19/2019 08/30/2017   Decreased Interest 0 0   Down, Depressed, Hopeless 0 0   PHQ - 2 Score 0 0   Altered sleeping 1  -   Tired, decreased energy 2 -   Change in appetite 0 -   Feeling bad or failure about yourself  0 -   Trouble concentrating 0 -   Moving slowly or fidgety/restless 0 -   Suicidal  thoughts 0 -   PHQ-9 Score 3 -   Difficult doing work/chores Somewhat difficult -     Interpretation of Total Score  Total Score Depression Severity:  1-4 = Minimal depression, 5-9 = Mild depression, 10-14 = Moderate depression, 15-19 = Moderately severe depression, 20-27 = Severe depression   Psychosocial Evaluation and Intervention: Psychosocial Evaluation - 06/19/19 1037      Psychosocial Evaluation & Interventions   Interventions  Encouraged to exercise with the program and follow exercise prescription;Stress management education;Relaxation education    Comments  follow for change in psychosocial issues.    Continue Psychosocial Services   Follow up required by staff       Psychosocial Re-Evaluation: Psychosocial Re-Evaluation    [place] Name 07/28/19 1239 08/25/19 1249           Psychosocial Re-Evaluation   Current issues with  Current Stress Concerns  Current Stress Concerns      Comments  Dealing with the stress of having a chronic illness well, her mother is very supportive.  Continues to handle her chronic illness well without psychosocial concerns      Expected Outcomes  Will continue to handle her stress in a healthy manner, still does not desire a referral to a therapist  For her to continue with mental well-being and have no psychosocial concerns while participating in pulmonary rehab.      Interventions  Encouraged to attend Pulmonary Rehabilitation for the exercise;Stress management education;Relaxation education  Stress management education      Continue Psychosocial Services   Follow up required by staff  No Follow up required      Comments  continue to manage stress in a healthy manner  --        Initial Review   Source of Stress Concerns  Chronic Illness;Unable to perform yard/household activities  Chronic Illness         Psychosocial Discharge (Final Psychosocial Re-Evaluation): Psychosocial Re-Evaluation - 08/25/19 1249      Psychosocial Re-Evaluation    Current issues with  Current Stress Concerns    Comments  Continues to handle her chronic illness well without psychosocial concerns    Expected Outcomes  For her to continue with mental well-being and have no psychosocial concerns while participating in pulmonary  rehab.    Interventions  Stress management education    Continue Psychosocial Services   No Follow up required      Initial Review   Source of Stress Concerns  Chronic Illness       Education: Education Goals: Education classes will be provided on a weekly basis, covering required topics. Participant will state understanding/return demonstration of topics presented.  Learning Barriers/Preferences: Learning Barriers/Preferences - 06/19/19 1038      Learning Barriers/Preferences   Learning Barriers  None    Learning Preferences  Audio;Computer/Internet;Group Instruction;Individual Instruction;Pictoral;Skilled Demonstration;Verbal Instruction;Video;Written Material       Education Topics: Risk Factor Reduction:  -Group instruction that is supported by a PowerPoint presentation. Instructor discusses the definition of a risk factor, different risk factors for pulmonary disease, and how the heart and lungs work together.     PULMONARY REHAB OTHER RESPIRATORY from 08/21/2019 in [place]  Date  08/14/19  Educator  DF  Instruction Review Code  2- Demonstrated Understanding      Nutrition for Pulmonary Patient:  -Group instruction provided by PowerPoint slides, verbal discussion, and written materials to support subject matter. The instructor gives an explanation and review of healthy diet recommendations, which includes a discussion on weight management, recommendations for fruit and vegetable consumption, as well as protein, fluid, caffeine, fiber, sodium, sugar, and alcohol. Tips for eating when patients are short of breath are discussed.   Pursed Lip Breathing:  -Group instruction that is  supported by demonstration and informational handouts. Instructor discusses the benefits of pursed lip and diaphragmatic breathing and detailed demonstration on how to preform both.     Oxygen Safety:  -Group instruction provided by PowerPoint, verbal discussion, and written material to support subject matter. There is an overview of "What is Oxygen" and "Why do we need it".  Instructor also reviews how to create a safe environment for oxygen use, the importance of using oxygen as prescribed, and the risks of noncompliance. There is a brief discussion on traveling with oxygen and resources the patient may utilize.   Oxygen Equipment:  -Group instruction provided by [facility] Staff utilizing handouts, written materials, and equipment demonstrations.   Signs and Symptoms:  -Group instruction provided by written material and verbal discussion to support subject matter. Warning signs and symptoms of infection, stroke, and heart attack are reviewed and when to call the physician/911 reinforced. Tips for preventing the spread of infection discussed.   Advanced Directives:  -Group instruction provided by verbal instruction and written material to support subject matter. Instructor reviews Advanced Directive laws and proper instruction for filling out document.   Pulmonary Video:  -Group video education that reviews the importance of medication and oxygen compliance, exercise, good nutrition, pulmonary hygiene, and pursed lip and diaphragmatic breathing for the pulmonary patient.   Exercise for the Pulmonary Patient:  -Group instruction that is supported by a PowerPoint presentation. Instructor discusses benefits of exercise, core components of exercise, frequency, duration, and intensity of an exercise routine, importance of utilizing pulse oximetry during exercise, safety while exercising, and options of places to exercise outside of rehab.     Pulmonary Medications:  -Verbally interactive  group education provided by instructor with focus on inhaled medications and proper administration.   Anatomy and Physiology of the Respiratory System and Intimacy:  -Group instruction provided by PowerPoint, verbal discussion, and written material to support subject matter. Instructor reviews respiratory cycle and anatomical components of the respiratory system and their functions. Instructor  also reviews differences in obstructive and restrictive respiratory diseases with examples of each. Intimacy, Sex, and Sexuality differences are reviewed with a discussion on how relationships can change when diagnosed with pulmonary disease. Common sexual concerns are reviewed.   PULMONARY REHAB OTHER RESPIRATORY from 08/21/2019 in [place]  Date  07/17/19  Educator  handout      MD DAY -A group question and answer session with a medical doctor that allows participants to ask questions that relate to their pulmonary disease state.   OTHER EDUCATION -Group or individual verbal, written, or video instructions that support the educational goals of the pulmonary rehab program.   PULMONARY REHAB OTHER RESPIRATORY from 08/21/2019 in [place]  Date  08/21/19 [METs]  Educator  DF  Instruction Review Code  2- Demonstrated Understanding      Holiday Eating Survival Tips:  -Group instruction provided by PowerPoint slides, verbal discussion, and written materials to support subject matter. The instructor gives patients tips, tricks, and techniques to help them not only survive but enjoy the holidays despite the onslaught of food that accompanies the holidays.   Knowledge Questionnaire Score: Knowledge Questionnaire Score - 06/19/19 1112      Knowledge Questionnaire Score   Pre Score  15/18       Core Components/Risk Factors/Patient Goals at Admission: Personal Goals and Risk Factors at Admission - 06/19/19 1039      Core  Components/Risk Factors/Patient Goals on Admission   Improve shortness of breath with ADL's  Yes    Intervention  Provide education, individualized exercise plan and daily activity instruction to help decrease symptoms of SOB with activities of daily living.    Expected Outcomes  Short Term: Improve cardiorespiratory fitness to achieve a reduction of symptoms when performing ADLs;Long Term: Be able to perform more ADLs without symptoms or delay the onset of symptoms       Core Components/Risk Factors/Patient Goals Review:  Goals and Risk Factor Review    Row Name 06/19/19 1039 07/28/19 1242 08/25/19 1251         Core Components/Risk Factors/Patient Goals Review   Personal Goals Review  Develop more efficient breathing techniques such as purse lipped breathing and diaphragmatic breathing and practicing self-pacing with activity.;Increase knowledge of respiratory medications and ability to use respiratory devices properly.;Improve shortness of breath with ADL's  Develop more efficient breathing techniques such as purse lipped breathing and diaphragmatic breathing and practicing self-pacing with activity.;Increase knowledge of respiratory medications and ability to use respiratory devices properly.;Improve shortness of breath with ADL's  Develop more efficient breathing techniques such as purse lipped breathing and diaphragmatic breathing and practicing self-pacing with activity.;Increase knowledge of respiratory medications and ability to use respiratory devices properly.;Improve shortness of breath with ADL's     Review  --  Due to her work schedule is only attending 1 exercise session weekly in pulmonary rehab, is very deconditioned, has attended 4 sessions, can finally complete 30 minutes of exercise at this point.  [place] has been slow to progress, due to her work schedule she is only able to participate in 1 exercise session per week with [place], she has been encouraged to exercise at least 2 more  days per week.  She is exercising on level 2 of the nustep, the recumbent bicycle is very difficult for her, she remains on level 1 and has to take multiple breaks to complete 15 minutes.     Expected Outcomes  --  Will  continue to become stronger as the workloads are increased on the exercise equipment  See admission goals.        Core Components/Risk Factors/Patient Goals at Discharge (Final Review):  Goals and Risk Factor Review - 08/25/19 1251      Core Components/Risk Factors/Patient Goals Review   Personal Goals Review  Develop more efficient breathing techniques such as purse lipped breathing and diaphragmatic breathing and practicing self-pacing with activity.;Increase knowledge of respiratory medications and ability to use respiratory devices properly.;Improve shortness of breath with ADL's    Review  [place] has been slow to progress, due to her work schedule she is only able to participate in 1 exercise session per week with [place], she has been encouraged to exercise at least 2 more days per week.  She is exercising on level 2 of the nustep, the recumbent bicycle is very difficult for her, she remains on level 1 and has to take multiple breaks to complete 15 minutes.    Expected Outcomes  See admission goals.       ITP Comments:   Comments: ITP REVIEW Pt is making expected progress toward pulmonary rehab goals after completing 7 sessions. Recommend continued exercise, life style modification, education, and utilization of breathing techniques to increase stamina and strength and decrease shortness of breath with exertion.

## 2019-08-28 ENCOUNTER — Ambulatory Visit: Payer: 59 | Admitting: Pulmonary Disease

## 2019-08-28 ENCOUNTER — Encounter: Payer: Self-pay | Admitting: Pulmonary Disease

## 2019-08-28 ENCOUNTER — Encounter (HOSPITAL_COMMUNITY)
Admission: RE | Admit: 2019-08-28 | Discharge: 2019-08-28 | Disposition: A | Discharge status: Home / Self Care | Payer: 59 | Source: Ambulatory Visit | Attending: Pulmonary Disease | Admitting: Pulmonary Disease

## 2019-08-28 ENCOUNTER — Other Ambulatory Visit: Payer: Self-pay

## 2019-08-28 VITALS — BP 118/80 | HR 94 | Temp 98.2°F | Ht 64.0 in | Wt 135.2 lb

## 2019-08-28 VITALS — Wt 134.0 lb

## 2019-08-28 DIAGNOSIS — Z5181 Encounter for therapeutic drug level monitoring: Secondary | ICD-10-CM | POA: Diagnosis not present

## 2019-08-28 DIAGNOSIS — J849 Interstitial pulmonary disease, unspecified: Secondary | ICD-10-CM

## 2019-08-28 NOTE — Addendum Note (Signed)
Addended by: Suzzanne Cloud E on: 08/28/2019 04:08 PM   Modules accepted: Orders

## 2019-08-28 NOTE — H&P (View-Only) (Signed)
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 Haroldine Laws on 09/27/17 with no evidence of pulmonary hypertension Started on CellCept May 2019 and uptitrated to max dose of 1.5 mg twice daily.  She had a hospitalization in [facility] in March 2019 for hypertension.  A CT chest scan at that time showed subtle lower lung findings of atelectasis, groundglass.  She has a right thyroid nodule which was biopsied on 08/15/17 with pathology showing benign findings.  Thyroid function tests are normal.  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  Reviewed note from Dr. Trudie Reed, rheumatology 07/12/2017 Seen for joint pain, Raynaud's, elevated CK, skin thickening Serologies positive for Sjogren's negative,, normal CK and aldolase.  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  No mold. No hot tub, Jacuzzi. She has down comforter.  Smoking history: Never smoker Travel history: Grew up in [place].  Lived in [place] and [place] Relevant family history: No family history of lung disease.    Interim history: Continues on CellCept 1.5 mg twice daily and Ofev Currently undergoing pulmonary rehab and is set to graduate today.  States that rehab has improved her symptoms Due to get repeat right heart cath by Dr. Haroldine Laws  later this  month.  Outpatient Encounter Medications as of 08/28/2019  Medication Sig  . bisoprolol (ZEBETA) 5 MG tablet TAKE ONE TABLET BY MOUTH DAILY.  Marland Kitchen Cholecalciferol (VITAMIN D-3) 5000 units TABS Take 5,000 Units by mouth daily.   Marland Kitchen esomeprazole (NEXIUM) 40 MG capsule TAKE 1 CAPSULE BY MOUTH TWO TIMES DAILY  . furosemide (LASIX) 40 MG tablet Take 40 mg by mouth daily. Except Saturdays and Sundays  . metoCLOPramide (REGLAN) 5 MG tablet Take 1 tablet (5 mg total) by mouth daily before breakfast.  . Multiple Vitamins-Minerals (MEGA MULTIVITAMIN) POWD Take 1 Scoop by mouth daily. Mix 1 scoop in water  . mycophenolate (CELLCEPT) 500 MG tablet Take 3 tablets by mouth every morning and every evening.  . Nintedanib (OFEV) 150 MG CAPS Take 1 capsule (150 mg total) by mouth 2 (two) times daily.  . ondansetron (ZOFRAN) 4 MG tablet Take 1 tablet (4 mg total) by mouth every 8 (eight) hours as needed for nausea or vomiting.  . potassium chloride 20 MEQ/15ML (10%) SOLN Take 20 mEq by mouth daily. Except on saturdays and sundays  . progesterone (PROMETRIUM) 200 MG capsule Take 1 capsule (200 mg total) by mouth daily.  Marland Kitchen spironolactone (ALDACTONE) 25 MG tablet Take 1 tablet (25 mg total) by mouth daily.  Marland Kitchen sulfamethoxazole-trimethoprim (BACTRIM DS) 800-160 MG tablet Take 1 tablet by mouth daily.  Marland Kitchen thyroid (NP THYROID) 30 MG tablet Take 1 tablet (30 mg total) by mouth daily before breakfast.  . [DISCONTINUED] amoxicillin-clavulanate (AUGMENTIN) 875-125 MG tablet Take 1 tablet by mouth 2 (two) times daily.   No facility-administered encounter medications on file as of  08/28/2019.    Physical Exam: Blood pressure 118/80, pulse 94, temperature 98.2 F (36.8 C), temperature source Temporal, height 5\' 4"  (1.626 m), weight 135 lb 3.2 oz (61.3 kg), SpO2 100 %. Gen:      No acute distress HEENT:  EOMI, sclera anicteric Neck:     No masses; no thyromegaly Lungs:    Clear to auscultation bilaterally; normal respiratory  effort CV:         Regular rate and rhythm; no murmurs Abd:      + bowel sounds; soft, non-tender; no palpable masses, no distension Ext:    No edema; adequate peripheral perfusion Skin:      Warm and dry; no rash Neuro: alert and oriented x 3 Psych: normal mood and affect  Data Reviewed: Imaging CT chest, [facility] 07/24/2017 Dependent atelectasis, subtle groundglass opacities at the base.  2.5 cm right thyroid nodule.  I have reviewed the images personally  CT high-resolution 09/18/2017- patchy reticular groundglass opacities, reticulation with subpleural sparing.  Mild traction bronchiectasis.  No honeycombing.  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  Aortic atherosclerosis, three-vessel coronary artery disease.  CT high-resolution 06/27/2018-stable interstitial lung disease.  CT high-resolution 06/11/2019-stable interstitial lung disease I have reviewed the images personally.  PFTs FENO 09/06/2017-unable to complete  07/27/2017 FVC 1.65 (54%), FEV1 1.53 (62%], F/F 93, TLC 50, DLCO 44%, DLCO/VA 131%  10/25/2017 FVC 1.51 [50%], FEV1 1.26 [51%], F/F 83  01/29/2018 FVC 1.69 [5%), FEV1 1.50 [61%], F/F 89, DLCO 10.57 [41%]  07/01/2018 FVC 1.57 [52%), FEV1 1.47 [60%], F/F 93, DLCO 12.54 [59%)  01/06/2019 FVC 1.58 [52%], FEV1 1.46 [60%], F/F 92, TLC 2.62 [50%], DLCO 9.16 [42%] Severe restriction and diffusion impairment.  FVC is stable but DLCO has worsened.  6-minute walk  10/23/2017- 144 m Post walk heart rate, stats 94, 91%  02/04/2018- 249 m Post walk heart rate, sats 101, 99%  06/06/2018-212 m Post walk heart rate, sats 86, 92%  01/30/2019-12m Post walk heart rate, sats 83, 98%  Labs 08/20/2017-ANA, centromere, rheumatoid factor, SCL 70-negative QuantiFERON 09/12/2017-negative Hepatitis B, C screening 09/21/2017-negative G6PD 09/25/2017-13  Labs from Dr. Trudie Reed 07/12/17 Rheumatoid factor < 10,  CCP-8, ANCA-negative, double-stranded DNA-negative, Smith antibody-negative, SCL 70-negative, RNP-negative SSA 5.8, SSB 1.5 Smith antibody-negative, ANA IFA-negative Myositis panel-negative CK 165, C4-30, C3-168, total complement greater than 60, aldolase 8.8 Sed rate 9, CRP 0.9  CBC, hepatic function panel 06/17/2018-within normal limits.  Cardiac Cardiac cath 09/27/17 RA = 2 RV = 33/6 PA = 32/10 (19) PCW = 6 Fick cardiac output/index = 5.0/3.0 PVR = 2.6 Ao sat = 99% PA sat = 74%, 75%  Overnight oximetry 01/30/2019- test time 8 hours 25 minutes Lowest O2 sat 87%.  Mean O2 sat 95%.  Time spent less than 88% - 1 minute 30 seconds No significant desaturation.  Does not need supplemental oxygen.  Assessment:  Scleroderma ILD Continue on CellCept 1.5 g twice daily, Bactrim prophylaxis.   Clinically she is stable but PFTs show reduction in DLCO and there is reduction in 6-minute walk test No evidence of pulmonary hypertension on right heart catheterization in the past.  With worsening PFTs we have started her on Ofev in December 2020 CT shows stable pulmonary fibrosis after Ofev initiation Continue pulmonary rehab Follow-up at [facility] transplant.  She is still early stage with regard to transplant but it is good that she has established with them now Follow hepatic panel today.  Esophageal  dysfunction, esophageal stricture, GERD Continue on Protonix 20 mg twice daily Follows with [facility] gastroenterology  Plan/Recommendations: - Continue CellCept at current dose of 1.5 twice daily. - Continue Ofev - Hepatic panel - Bactrim prophylaxis. - Pulmonary rehab  Marshell Garfinkel MD [facility] 08/28/2019, 3:51 PM

## 2019-08-28 NOTE — Progress Notes (Signed)
Karen [name]    WC:[id number]    1971/03/20  Primary Care Physician:Gosrani, Doristine Johns, MD  Referring Physician: [name], MD [address] 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 Haroldine Laws on 09/27/17 with no evidence of pulmonary hypertension Started on CellCept May 2019 and uptitrated to max dose of 1.5 mg twice daily.  She had a hospitalization in [facility] in March 2019 for hypertension.  A CT chest scan at that time showed subtle lower lung findings of atelectasis, groundglass.  She has a right thyroid nodule which was biopsied on 08/15/17 with pathology showing benign findings.  Thyroid function tests are normal.  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  Reviewed note from Dr. Trudie Reed, rheumatology 07/12/2017 Seen for joint pain, Raynaud's, elevated CK, skin thickening Serologies positive for Sjogren's negative,, normal CK and aldolase.  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  No mold. No hot tub, Jacuzzi. She has down comforter.  Smoking history: Never smoker Travel history: Grew up in [place].  Lived in [place] and [place] Relevant family history: No family history of lung disease.    Interim history: Continues on CellCept 1.5 mg twice daily and Ofev Currently undergoing pulmonary rehab and is set to graduate today.  States that rehab has improved her symptoms Due to get repeat right heart cath by Dr. Haroldine Laws  later this  month.  Outpatient Encounter Medications as of 08/28/2019  Medication Sig  . bisoprolol (ZEBETA) 5 MG tablet TAKE ONE TABLET BY MOUTH DAILY.  Marland Kitchen Cholecalciferol (VITAMIN D-3) 5000 units TABS Take 5,000 Units by mouth daily.   Marland Kitchen esomeprazole (NEXIUM) 40 MG capsule TAKE 1 CAPSULE BY MOUTH TWO TIMES DAILY  . furosemide (LASIX) 40 MG tablet Take 40 mg by mouth daily. Except Saturdays and Sundays  . metoCLOPramide (REGLAN) 5 MG tablet Take 1 tablet (5 mg total) by mouth daily before breakfast.  . Multiple Vitamins-Minerals (MEGA MULTIVITAMIN) POWD Take 1 Scoop by mouth daily. Mix 1 scoop in water  . mycophenolate (CELLCEPT) 500 MG tablet Take 3 tablets by mouth every morning and every evening.  . Nintedanib (OFEV) 150 MG CAPS Take 1 capsule (150 mg total) by mouth 2 (two) times daily.  . ondansetron (ZOFRAN) 4 MG tablet Take 1 tablet (4 mg total) by mouth every 8 (eight) hours as needed for nausea or vomiting.  . potassium chloride 20 MEQ/15ML (10%) SOLN Take 20 mEq by mouth daily. Except on saturdays and sundays  . progesterone (PROMETRIUM) 200 MG capsule Take 1 capsule (200 mg total) by mouth daily.  Marland Kitchen spironolactone (ALDACTONE) 25 MG tablet Take 1 tablet (25 mg total) by mouth daily.  Marland Kitchen sulfamethoxazole-trimethoprim (BACTRIM DS) 800-160 MG tablet Take 1 tablet by mouth daily.  Marland Kitchen thyroid (NP THYROID) 30 MG tablet Take 1 tablet (30 mg total) by mouth daily before breakfast.  . [DISCONTINUED] amoxicillin-clavulanate (AUGMENTIN) 875-125 MG tablet Take 1 tablet by mouth 2 (two) times daily.   No facility-administered encounter medications on file as of  08/28/2019.    Physical Exam: Blood pressure 118/80, pulse 94, temperature 98.2 F (36.8 C), temperature source Temporal, height 5\' 4"  (1.626 m), weight 135 lb 3.2 oz (61.3 kg), SpO2 100 %. Gen:      No acute distress HEENT:  EOMI, sclera anicteric Neck:     No masses; no thyromegaly Lungs:    Clear to auscultation bilaterally; normal respiratory  effort CV:         Regular rate and rhythm; no murmurs Abd:      + bowel sounds; soft, non-tender; no palpable masses, no distension Ext:    No edema; adequate peripheral perfusion Skin:      Warm and dry; no rash Neuro: alert and oriented x 3 Psych: normal mood and affect  Data Reviewed: Imaging CT chest, [facility] 07/24/2017 Dependent atelectasis, subtle groundglass opacities at the base.  2.5 cm right thyroid nodule.  I have reviewed the images personally  CT high-resolution 09/18/2017- patchy reticular groundglass opacities, reticulation with subpleural sparing.  Mild traction bronchiectasis.  No honeycombing.  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  Aortic atherosclerosis, three-vessel coronary artery disease.  CT high-resolution 06/27/2018-stable interstitial lung disease.  CT high-resolution 06/11/2019-stable interstitial lung disease I have reviewed the images personally.  PFTs FENO 09/06/2017-unable to complete  07/27/2017 FVC 1.65 (54%), FEV1 1.53 (62%], F/F 93, TLC 50, DLCO 44%, DLCO/VA 131%  10/25/2017 FVC 1.51 [50%], FEV1 1.26 [51%], F/F 83  01/29/2018 FVC 1.69 [5%), FEV1 1.50 [61%], F/F 89, DLCO 10.57 [41%]  07/01/2018 FVC 1.57 [52%), FEV1 1.47 [60%], F/F 93, DLCO 12.54 [59%)  01/06/2019 FVC 1.58 [52%], FEV1 1.46 [60%], F/F 92, TLC 2.62 [50%], DLCO 9.16 [42%] Severe restriction and diffusion impairment.  FVC is stable but DLCO has worsened.  6-minute walk  10/23/2017- 144 m Post walk heart rate, stats 94, 91%  02/04/2018- 249 m Post walk heart rate, sats 101, 99%  06/06/2018-212 m Post walk heart rate, sats 86, 92%  01/30/2019-198m Post walk heart rate, sats 83, 98%  Labs 08/20/2017-ANA, centromere, rheumatoid factor, SCL 70-negative QuantiFERON 09/12/2017-negative Hepatitis B, C screening 09/21/2017-negative G6PD 09/25/2017-13  Labs from Dr. Trudie Reed 07/12/17 Rheumatoid factor < 10,  CCP-8, ANCA-negative, double-stranded DNA-negative, Smith antibody-negative, SCL 70-negative, RNP-negative SSA 5.8, SSB 1.5 Smith antibody-negative, ANA IFA-negative Myositis panel-negative CK 165, C4-30, C3-168, total complement greater than 60, aldolase 8.8 Sed rate 9, CRP 0.9  CBC, hepatic function panel 06/17/2018-within normal limits.  Cardiac Cardiac cath 09/27/17 RA = 2 RV = 33/6 PA = 32/10 (19) PCW = 6 Fick cardiac output/index = 5.0/3.0 PVR = 2.6 Ao sat = 99% PA sat = 74%, 75%  Overnight oximetry 01/30/2019- test time 8 hours 25 minutes Lowest O2 sat 87%.  Mean O2 sat 95%.  Time spent less than 88% - 1 minute 30 seconds No significant desaturation.  Does not need supplemental oxygen.  Assessment:  Scleroderma ILD Continue on CellCept 1.5 g twice daily, Bactrim prophylaxis.   Clinically she is stable but PFTs show reduction in DLCO and there is reduction in 6-minute walk test No evidence of pulmonary hypertension on right heart catheterization in the past.  With worsening PFTs we have started her on Ofev in December 2020 CT shows stable pulmonary fibrosis after Ofev initiation Continue pulmonary rehab Follow-up at [facility] transplant.  She is still early stage with regard to transplant but it is good that she has established with them now Follow hepatic panel today.  Esophageal  dysfunction, esophageal stricture, GERD Continue on Protonix 20 mg twice daily Follows with [facility] gastroenterology  Plan/Recommendations: - Continue CellCept at current dose of 1.5 twice daily. - Continue Ofev - Hepatic panel - Bactrim prophylaxis. - Pulmonary rehab  Marshell Garfinkel MD [facility] 08/28/2019, 3:51 PM

## 2019-08-28 NOTE — Patient Instructions (Signed)
We will check your liver test today I am glad your breathing is doing well Continue medications as prescribed Follow-up in 1 to 2 months.

## 2019-08-28 NOTE — Progress Notes (Signed)
Daily Session Note  Patient Details  Name: Karen [name] MRN: [id number] Date of Birth: 11-02-1970 Referring Provider:     Pulmonary Rehab Walk Test from 06/19/2019 in [place]  Referring Provider  Dr. Vaughan Browner      Encounter Date: 08/28/2019  Check In: Session Check In - 08/28/19 1045      Check-In   Supervising physician immediately available to respond to emergencies  [facility] immediately available    Physician(s)  Dr. Dessa Phi    Location  [facility]    Staff Present  Rosebud Poles, RN, Bjorn Loser, MS, Exercise Physiologist;Rashunda Passon Ysidro Evert, RN    Virtual Visit  No    Medication changes reported      No    Fall or balance concerns reported     No    Tobacco Cessation  No Change    Warm-up and Cool-down  Performed as group-led instruction    Resistance Training Performed  Yes    VAD Patient?  No    PAD/SET Patient?  No      Pain Assessment   Currently in Pain?  No/denies    Multiple Pain Sites  No       Capillary Blood Glucose: No results found for this or any previous visit (from the past 24 hour(s)).    Social History   Tobacco Use  Smoking Status Never Smoker  Smokeless Tobacco Never Used    Goals Met:  Exercise tolerated well No report of cardiac concerns or symptoms Strength training completed today  Goals Unmet:  Not Applicable  Comments: Service time is from 1000 to 1107    Dr. Fransico Him is Medical Director for Cardiac Rehab at [facility].

## 2019-08-29 LAB — COMPREHENSIVE METABOLIC PANEL
ALT: 9 U/L (ref 0–35)
AST: 20 U/L (ref 0–37)
Albumin: 4.2 g/dL (ref 3.5–5.2)
Alkaline Phosphatase: 76 U/L (ref 39–117)
BUN: 18 mg/dL (ref 6–23)
CO2: 28 mEq/L (ref 19–32)
Calcium: 10.7 mg/dL — ABNORMAL HIGH (ref 8.4–10.5)
Chloride: 106 mEq/L (ref 96–112)
Creatinine, Ser: 0.71 mg/dL (ref 0.40–1.20)
GFR: 104.88 mL/min (ref >60.00)
Glucose, Bld: 72 mg/dL (ref 70–99)
Potassium: 4.1 mEq/L (ref 3.5–5.1)
Sodium: 140 mEq/L (ref 135–145)
Total Bilirubin: 0.4 mg/dL (ref 0.2–1.2)
Total Protein: 7.8 g/dL (ref 6.0–8.3)

## 2019-09-04 ENCOUNTER — Encounter (HOSPITAL_COMMUNITY): Payer: 59

## 2019-09-04 ENCOUNTER — Other Ambulatory Visit (HOSPITAL_COMMUNITY)
Admission: RE | Admit: 2019-09-04 | Discharge: 2019-09-04 | Disposition: A | Discharge status: Home / Self Care | Payer: 59 | Source: Ambulatory Visit | Attending: Internal Medicine | Admitting: Internal Medicine

## 2019-09-04 DIAGNOSIS — Z01812 Encounter for preprocedural laboratory examination: Secondary | ICD-10-CM | POA: Insufficient documentation

## 2019-09-04 DIAGNOSIS — Z20822 Contact with and (suspected) exposure to covid-19: Secondary | ICD-10-CM | POA: Diagnosis not present

## 2019-09-04 LAB — SARS CORONAVIRUS 2 (TAT 6-24 HRS): SARS Coronavirus 2: NEGATIVE

## 2019-09-04 NOTE — Progress Notes (Signed)
Discharge Progress Report  Patient Details  Name: Karen [name] MRN: [id number] Date of Birth: 04-21-1971 Referring Provider:     Pulmonary Rehab Walk Test from 06/19/2019 in [place]  Referring Provider  Dr. Vaughan Browner       Number of Visits: 8  Reason for Discharge:  Patient reached a stable level of exercise. Patient independent in their exercise. Patient has met program and personal goals.  Smoking History:  Social History   Tobacco Use  Smoking Status Never Smoker  Smokeless Tobacco Never Used    Diagnosis:  Interstitial lung disease ([place])  ADL [facility]: Pulmonary Assessment Scores    Row Name 06/19/19 1119 06/19/19 1139       ADL [facility]   ADL Phase  Entry  --    SOB Score total  51  --      CAT Score   CAT Score  19  --      mMRC Score   mMRC Score  --  4       Initial Exercise Prescription: Initial Exercise Prescription - 06/19/19 1100      Date of Initial Exercise RX and Referring Provider   Date  06/19/19    Referring Provider  Dr. Vaughan Browner    Expected Discharge Date  08/20/19      Recumbant Bike   Level  1    Watts  5    Minutes  15    METs  2.29      NuStep   Level  2    SPM  75    Minutes  15    METs  1.5      Prescription Details   Frequency (times per week)  1    Duration  Progress to 30 minutes of continuous aerobic without signs/symptoms of physical distress      Intensity   THRR 40-80% of Max Heartrate  68-136    Ratings of Perceived Exertion  11-13    Perceived Dyspnea  0-4      Progression   Progression  Continue to progress workloads to maintain intensity without signs/symptoms of physical distress.      Resistance Training   Training Prescription  Yes    Weight  --   Resistance Bands- Orange    Reps  10-15       Discharge Exercise Prescription (Final Exercise Prescription Changes): Exercise Prescription Changes - 08/28/19 1100      Response to Exercise   Blood Pressure (Admit)  110/60     Blood Pressure (Exit)  116/78    Heart Rate (Admit)  89 bpm    Heart Rate (Exercise)  102 bpm    Heart Rate (Exit)  95 bpm    Oxygen Saturation (Admit)  92 %    Oxygen Saturation (Exercise)  96 %    Oxygen Saturation (Exit)  97 %    Rating of Perceived Exertion (Exercise)  13    Perceived Dyspnea (Exercise)  2    Duration  Continue with 30 min of aerobic exercise without signs/symptoms of physical distress.    Intensity  THRR unchanged      Progression   Progression  Continue to progress workloads to maintain intensity without signs/symptoms of physical distress.      Resistance Training   Training Prescription  Yes    Weight  orange bands    Reps  10-15    Time  10 Minutes      Recumbant Bike  Level  1    Minutes  15    METs  2.1      NuStep   Level  2    SPM  80    Minutes  15    METs  2       Functional Capacity: 6 Minute Walk    Row Name 06/19/19 1130 08/28/19 1141       6 Minute Walk   Phase  Initial  Discharge    Distance  800 feet  1000 feet    Distance % Change  --  25 %    Distance Feet Change  --  200 ft    Walk Time  6 minutes  6 minutes    # of Rest Breaks  0  0    MPH  1.5  1.89    METS  3.4  3.75    RPE  12  12    Perceived Dyspnea   2  1    VO2 Peak  12.19  13.13    Symptoms  Yes (comment)  No    Comments  Tiredness, +2 SOB  --    Resting HR  88 bpm  93 bpm    Resting BP  142/82  110/60    Resting Oxygen Saturation   95 %  97 %    Exercise Oxygen Saturation  during 6 min walk  92 %  92 %    Max Ex. HR  97 bpm  108 bpm    Max Ex. BP  146/82  134/70    2 Minute Post BP  124/84  116/78      Interval HR   1 Minute HR  95  100    2 Minute HR  97  101    3 Minute HR  97  106    4 Minute HR  95  108    5 Minute HR  95  107    6 Minute HR  94  108    2 Minute Post HR  83  101    Interval Heart Rate?  Yes  Yes      Interval Oxygen   Interval Oxygen?  Yes  Yes    Baseline Oxygen Saturation %  95 % RA  97 %    1 Minute Oxygen  Saturation %  93 % RA  92 %    1 Minute Liters of Oxygen  -- RA  0 L    2 Minute Oxygen Saturation %  93 %  95 %    2 Minute Liters of Oxygen  -- RA  0 L    3 Minute Oxygen Saturation %  93 %  95 %    3 Minute Liters of Oxygen  -- RA  0 L    4 Minute Oxygen Saturation %  92 %  96 %    4 Minute Liters of Oxygen  -- RA  0 L    5 Minute Oxygen Saturation %  95 %  95 %    5 Minute Liters of Oxygen  -- RA  0 L    6 Minute Oxygen Saturation %  94 %  94 %    6 Minute Liters of Oxygen  -- RA  0 L    2 Minute Post Oxygen Saturation %  96 %  94 %    2 Minute Post Liters of Oxygen  -- RA  0 L  Psychological, QOL, Others - Outcomes: PHQ 2/9: Depression screen [facility] 2/9 06/19/2019 08/30/2017  Decreased Interest 0 0  Down, Depressed, Hopeless 0 0  PHQ - 2 Score 0 0  Altered sleeping 1 -  Tired, decreased energy 2 -  Change in appetite 0 -  Feeling bad or failure about yourself  0 -  Trouble concentrating 0 -  Moving slowly or fidgety/restless 0 -  Suicidal thoughts 0 -  PHQ-9 Score 3 -  Difficult doing work/chores Somewhat difficult -    Quality of Life:   Personal Goals: Goals established at orientation with interventions provided to work toward goal. Personal Goals and Risk Factors at Admission - 06/19/19 1039      Core Components/Risk Factors/Patient Goals on Admission   Improve shortness of breath with ADL's  Yes    Intervention  Provide education, individualized exercise plan and daily activity instruction to help decrease symptoms of SOB with activities of daily living.    Expected Outcomes  Short Term: Improve cardiorespiratory fitness to achieve a reduction of symptoms when performing ADLs;Long Term: Be able to perform more ADLs without symptoms or delay the onset of symptoms        Personal Goals Discharge: Goals and Risk Factor Review    Row Name 06/19/19 1039 07/28/19 1242 08/25/19 1251         Core Components/Risk Factors/Patient Goals Review   Personal Goals  Review  Develop more efficient breathing techniques such as purse lipped breathing and diaphragmatic breathing and practicing self-pacing with activity.;Increase knowledge of respiratory medications and ability to use respiratory devices properly.;Improve shortness of breath with ADL's  Develop more efficient breathing techniques such as purse lipped breathing and diaphragmatic breathing and practicing self-pacing with activity.;Increase knowledge of respiratory medications and ability to use respiratory devices properly.;Improve shortness of breath with ADL's  Develop more efficient breathing techniques such as purse lipped breathing and diaphragmatic breathing and practicing self-pacing with activity.;Increase knowledge of respiratory medications and ability to use respiratory devices properly.;Improve shortness of breath with ADL's     Review  --  Due to her work schedule is only attending 1 exercise session weekly in pulmonary rehab, is very deconditioned, has attended 4 sessions, can finally complete 30 minutes of exercise at this point.  Deanndra has been slow to progress, due to her work schedule she is only able to participate in 1 exercise session per week with [place], she has been encouraged to exercise at least 2 more days per week.  She is exercising on level 2 of the nustep, the recumbent bicycle is very difficult for her, she remains on level 1 and has to take multiple breaks to complete 15 minutes.     Expected Outcomes  --  Will continue to become stronger as the workloads are increased on the exercise equipment  See admission goals.        Exercise Goals and Review: Exercise Goals    Row Name 06/19/19 1147 07/29/19 0658 08/26/19 0727         Exercise Goals   Increase Physical Activity  Yes  Yes  Yes     Intervention  Provide advice, education, support and counseling about physical activity/exercise needs.;Develop an individualized exercise prescription for aerobic and resistive training  based on initial evaluation findings, risk stratification, comorbidities and participant's personal goals.  Provide advice, education, support and counseling about physical activity/exercise needs.;Develop an individualized exercise prescription for aerobic and resistive training based on initial evaluation findings, risk stratification, comorbidities and  participant's personal goals.  Provide advice, education, support and counseling about physical activity/exercise needs.;Develop an individualized exercise prescription for aerobic and resistive training based on initial evaluation findings, risk stratification, comorbidities and participant's personal goals.     Expected Outcomes  Short Term: Attend rehab on a regular basis to increase amount of physical activity.;Long Term: Add in home exercise to make exercise part of routine and to increase amount of physical activity.;Long Term: Exercising regularly at least 3-5 days a week.  Short Term: Attend rehab on a regular basis to increase amount of physical activity.;Long Term: Add in home exercise to make exercise part of routine and to increase amount of physical activity.;Long Term: Exercising regularly at least 3-5 days a week.  Short Term: Attend rehab on a regular basis to increase amount of physical activity.;Long Term: Add in home exercise to make exercise part of routine and to increase amount of physical activity.;Long Term: Exercising regularly at least 3-5 days a week.     Increase Strength and Stamina  Yes  Yes  Yes     Intervention  Provide advice, education, support and counseling about physical activity/exercise needs.;Develop an individualized exercise prescription for aerobic and resistive training based on initial evaluation findings, risk stratification, comorbidities and participant's personal goals.  Provide advice, education, support and counseling about physical activity/exercise needs.;Develop an individualized exercise prescription for  aerobic and resistive training based on initial evaluation findings, risk stratification, comorbidities and participant's personal goals.  Provide advice, education, support and counseling about physical activity/exercise needs.;Develop an individualized exercise prescription for aerobic and resistive training based on initial evaluation findings, risk stratification, comorbidities and participant's personal goals.     Expected Outcomes  Short Term: Increase workloads from initial exercise prescription for resistance, speed, and METs.;Short Term: Perform resistance training exercises routinely during rehab and add in resistance training at home;Long Term: Improve cardiorespiratory fitness, muscular endurance and strength as measured by increased METs and functional capacity (6MWT)  Short Term: Increase workloads from initial exercise prescription for resistance, speed, and METs.;Short Term: Perform resistance training exercises routinely during rehab and add in resistance training at home;Long Term: Improve cardiorespiratory fitness, muscular endurance and strength as measured by increased METs and functional capacity (6MWT)  Short Term: Increase workloads from initial exercise prescription for resistance, speed, and METs.;Short Term: Perform resistance training exercises routinely during rehab and add in resistance training at home;Long Term: Improve cardiorespiratory fitness, muscular endurance and strength as measured by increased METs and functional capacity (6MWT)     Able to understand and use rate of perceived exertion (RPE) scale  Yes  Yes  Yes     Intervention  Provide education and explanation on how to use RPE scale  Provide education and explanation on how to use RPE scale  Provide education and explanation on how to use RPE scale     Expected Outcomes  Short Term: Able to use RPE daily in rehab to express subjective intensity level;Long Term:  Able to use RPE to guide intensity level when exercising  independently  Short Term: Able to use RPE daily in rehab to express subjective intensity level;Long Term:  Able to use RPE to guide intensity level when exercising independently  Short Term: Able to use RPE daily in rehab to express subjective intensity level;Long Term:  Able to use RPE to guide intensity level when exercising independently     Able to understand and use Dyspnea scale  Yes  Yes  Yes     Intervention  Provide education and explanation on how to use Dyspnea scale  Provide education and explanation on how to use Dyspnea scale  Provide education and explanation on how to use Dyspnea scale     Expected Outcomes  Short Term: Able to use Dyspnea scale daily in rehab to express subjective sense of shortness of breath during exertion;Long Term: Able to use Dyspnea scale to guide intensity level when exercising independently  Short Term: Able to use Dyspnea scale daily in rehab to express subjective sense of shortness of breath during exertion;Long Term: Able to use Dyspnea scale to guide intensity level when exercising independently  Short Term: Able to use Dyspnea scale daily in rehab to express subjective sense of shortness of breath during exertion;Long Term: Able to use Dyspnea scale to guide intensity level when exercising independently     Knowledge and understanding of Target Heart Rate Range (THRR)  Yes  Yes  Yes     Intervention  Provide education and explanation of THRR including how the numbers were predicted and where they are located for reference  Provide education and explanation of THRR including how the numbers were predicted and where they are located for reference  Provide education and explanation of THRR including how the numbers were predicted and where they are located for reference     Expected Outcomes  Short Term: Able to state/look up THRR;Long Term: Able to use THRR to govern intensity when exercising independently;Short Term: Able to use daily as guideline for intensity in  rehab  Short Term: Able to state/look up THRR;Long Term: Able to use THRR to govern intensity when exercising independently;Short Term: Able to use daily as guideline for intensity in rehab  Short Term: Able to state/look up THRR;Long Term: Able to use THRR to govern intensity when exercising independently;Short Term: Able to use daily as guideline for intensity in rehab     Able to check pulse independently  Yes  --  Yes     Intervention  Provide education and demonstration on how to check pulse in carotid and radial arteries.;Review the importance of being able to check your own pulse for safety during independent exercise  --  Provide education and demonstration on how to check pulse in carotid and radial arteries.;Review the importance of being able to check your own pulse for safety during independent exercise     Expected Outcomes  Short Term: Able to explain why pulse checking is important during independent exercise;Long Term: Able to check pulse independently and accurately  --  Short Term: Able to explain why pulse checking is important during independent exercise;Long Term: Able to check pulse independently and accurately     Understanding of Exercise Prescription  Yes  Yes  Yes     Intervention  Provide education, explanation, and written materials on patient's individual exercise prescription  Provide education, explanation, and written materials on patient's individual exercise prescription  Provide education, explanation, and written materials on patient's individual exercise prescription     Expected Outcomes  Short Term: Able to explain program exercise prescription;Long Term: Able to explain home exercise prescription to exercise independently  Short Term: Able to explain program exercise prescription;Long Term: Able to explain home exercise prescription to exercise independently  Short Term: Able to explain program exercise prescription;Long Term: Able to explain home exercise prescription  to exercise independently        Exercise Goals Re-Evaluation: Exercise Goals Re-Evaluation    Row Name 07/29/19 [phone] 08/26/19 [phone]  Exercise Goal Re-Evaluation   Exercise Goals Review  Increase Physical Activity;Increase Strength and Stamina;Able to understand and use rate of perceived exertion (RPE) scale;Able to understand and use Dyspnea scale;Knowledge and understanding of Target Heart Rate Range (THRR);Understanding of Exercise Prescription  Increase Physical Activity;Increase Strength and Stamina;Able to understand and use rate of perceived exertion (RPE) scale;Able to understand and use Dyspnea scale;Knowledge and understanding of Target Heart Rate Range (THRR);Understanding of Exercise Prescription      Comments  Pt has attended 4 exercise sessions. Pt only attends once a week due to work schedule. Pt was extremely deconditioned upon entering program and is progressing slowly. Pt currently exercises at 1.5 METs on the stepper. Will continue to monitor and progress as able.  Pt has attended 7 exercise sessions and will graduate this week. I have strongly encouraged her to get a gym membership once she graduates, so she does not go back to being sedentary. She has made some progress, she currently exercises at 2.5 METs on the stepper. Will continue to monitor and progress as able.      Expected Outcomes  Through exercise at rehab and at home, the patient will decrease shortness of breath with daily activities and feel confident in carrying out an exercise regime at home.  Through exercise at rehab and at home, the patient will decrease shortness of breath with daily activities and feel confident in carrying out an exercise regime at home.         Nutrition & Weight - Outcomes: Pre Biometrics - 06/19/19 1101      Pre Biometrics   Grip Strength  17 kg        Nutrition: Nutrition Therapy & Goals - 08/01/19 0650      Nutrition Therapy   Diet  Low Sodium/GERD      Personal  Nutrition Goals   Nutrition Goal  Pt to incorporate fruits, veggies, whole grains, and unsaturated fats as often as possible    Personal Goal #2  Pt to avoid lying down for 3 hours after each meal    Personal Goal #3  Pt to pack dinner and eat before going home in the evening      Intervention Plan   Intervention  Prescribe, educate and counsel regarding individualized specific dietary modifications aiming towards targeted core components such as weight, hypertension, lipid management, diabetes, heart failure and other comorbidities.;Nutrition handout(s) given to patient.    Expected Outcomes  Short Term Goal: A plan has been developed with personal nutrition goals set during dietitian appointment.;Long Term Goal: Adherence to prescribed nutrition plan.       Nutrition Discharge:   Education Questionnaire Score: Knowledge Questionnaire Score - 06/19/19 1112      Knowledge Questionnaire Score   Pre Score  15/18       Goals reviewed with patient; copy given to patient.

## 2019-09-05 ENCOUNTER — Other Ambulatory Visit: Payer: Self-pay

## 2019-09-05 ENCOUNTER — Encounter (HOSPITAL_COMMUNITY)
Admission: RE | Disposition: A | Discharge status: Home / Self Care | Payer: Self-pay | Source: Home / Self Care | Attending: Internal Medicine

## 2019-09-05 ENCOUNTER — Ambulatory Visit (HOSPITAL_COMMUNITY)
Admission: RE | Admit: 2019-09-05 | Discharge: 2019-09-05 | Disposition: A | Discharge status: Home / Self Care | Payer: 59 | Attending: Internal Medicine | Admitting: Internal Medicine

## 2019-09-05 DIAGNOSIS — K222 Esophageal obstruction: Secondary | ICD-10-CM | POA: Insufficient documentation

## 2019-09-05 DIAGNOSIS — I272 Pulmonary hypertension, unspecified: Secondary | ICD-10-CM

## 2019-09-05 DIAGNOSIS — I2721 Secondary pulmonary arterial hypertension: Secondary | ICD-10-CM | POA: Diagnosis not present

## 2019-09-05 DIAGNOSIS — I5032 Chronic diastolic (congestive) heart failure: Secondary | ICD-10-CM

## 2019-09-05 DIAGNOSIS — I10 Essential (primary) hypertension: Secondary | ICD-10-CM | POA: Insufficient documentation

## 2019-09-05 DIAGNOSIS — G629 Polyneuropathy, unspecified: Secondary | ICD-10-CM | POA: Diagnosis not present

## 2019-09-05 DIAGNOSIS — K219 Gastro-esophageal reflux disease without esophagitis: Secondary | ICD-10-CM | POA: Diagnosis not present

## 2019-09-05 DIAGNOSIS — M349 Systemic sclerosis, unspecified: Secondary | ICD-10-CM | POA: Insufficient documentation

## 2019-09-05 DIAGNOSIS — Z79899 Other long term (current) drug therapy: Secondary | ICD-10-CM | POA: Insufficient documentation

## 2019-09-05 DIAGNOSIS — J8410 Pulmonary fibrosis, unspecified: Secondary | ICD-10-CM | POA: Insufficient documentation

## 2019-09-05 DIAGNOSIS — J849 Interstitial pulmonary disease, unspecified: Secondary | ICD-10-CM | POA: Insufficient documentation

## 2019-09-05 HISTORY — PX: RIGHT HEART CATH: CATH118263

## 2019-09-05 LAB — CBC
HCT: 39.6 % (ref 36.0–46.0)
Hemoglobin: 12 g/dL (ref 12.0–15.0)
MCH: 27.2 pg (ref 26.0–34.0)
MCHC: 30.3 g/dL (ref 30.0–36.0)
MCV: 89.8 fL (ref 80.0–100.0)
Platelets: 222 10*3/uL (ref 150–400)
RBC: 4.41 MIL/uL (ref 3.87–5.11)
RDW: 14.2 % (ref 11.5–15.5)
WBC: 6.9 10*3/uL (ref 4.0–10.5)
nRBC: 0 % (ref 0.0–0.2)

## 2019-09-05 LAB — POCT I-STAT EG7
Acid-Base Excess: 1 mmol/L (ref 0.0–2.0)
Acid-base deficit: 2 mmol/L (ref 0.0–2.0)
Acid-base deficit: 3 mmol/L — ABNORMAL HIGH (ref 0.0–2.0)
Bicarbonate: 22.2 mmol/L (ref 20.0–28.0)
Bicarbonate: 23.1 mmol/L (ref 20.0–28.0)
Bicarbonate: 25.8 mmol/L (ref 20.0–28.0)
Calcium, Ion: 1.11 mmol/L — ABNORMAL LOW (ref 1.15–1.40)
Calcium, Ion: 1.22 mmol/L (ref 1.15–1.40)
Calcium, Ion: 1.39 mmol/L (ref 1.15–1.40)
HCT: 31 % — ABNORMAL LOW (ref 36.0–46.0)
HCT: 33 % — ABNORMAL LOW (ref 36.0–46.0)
HCT: 34 % — ABNORMAL LOW (ref 36.0–46.0)
Hemoglobin: 10.5 g/dL — ABNORMAL LOW (ref 12.0–15.0)
Hemoglobin: 11.2 g/dL — ABNORMAL LOW (ref 12.0–15.0)
Hemoglobin: 11.6 g/dL — ABNORMAL LOW (ref 12.0–15.0)
O2 Saturation: 70 %
O2 Saturation: 74 %
O2 Saturation: 74 %
Potassium: 2.7 mmol/L — CL (ref 3.5–5.1)
Potassium: 3 mmol/L — ABNORMAL LOW (ref 3.5–5.1)
Potassium: 3.3 mmol/L — ABNORMAL LOW (ref 3.5–5.1)
Sodium: 144 mmol/L (ref 135–145)
Sodium: 146 mmol/L — ABNORMAL HIGH (ref 135–145)
Sodium: 149 mmol/L — ABNORMAL HIGH (ref 135–145)
TCO2: 23 mmol/L (ref 22–32)
TCO2: 24 mmol/L (ref 22–32)
TCO2: 27 mmol/L (ref 22–32)
pCO2, Ven: 39.7 mmHg — ABNORMAL LOW (ref 44.0–60.0)
pCO2, Ven: 39.9 mmHg — ABNORMAL LOW (ref 44.0–60.0)
pCO2, Ven: 42.9 mmHg — ABNORMAL LOW (ref 44.0–60.0)
pH, Ven: 7.356 (ref 7.250–7.430)
pH, Ven: 7.371 (ref 7.250–7.430)
pH, Ven: 7.387 (ref 7.250–7.430)
pO2, Ven: 38 mmHg (ref 32.0–45.0)
pO2, Ven: 40 mmHg (ref 32.0–45.0)
pO2, Ven: 41 mmHg (ref 32.0–45.0)

## 2019-09-05 LAB — PROTIME-INR
INR: 1 (ref 0.8–1.2)
Prothrombin Time: 12.7 seconds (ref 11.4–15.2)

## 2019-09-05 SURGERY — RIGHT HEART CATH
Anesthesia: LOCAL

## 2019-09-05 MED ORDER — ASPIRIN 81 MG PO CHEW: 81.0000 mg | CHEWABLE_TABLET | ORAL | Status: DC

## 2019-09-05 MED ORDER — ACETAMINOPHEN 325 MG PO TABS: 650.0000 mg | ORAL_TABLET | ORAL | Status: DC | PRN

## 2019-09-05 MED ORDER — SODIUM CHLORIDE 0.9 % IV SOLN: INTRAVENOUS | Status: DC

## 2019-09-05 MED ORDER — LABETALOL HCL 5 MG/ML IV SOLN: 10.0000 mg | INTRAVENOUS | Status: DC | PRN

## 2019-09-05 MED ORDER — SODIUM CHLORIDE 0.9% FLUSH: 3.0000 mL | Freq: Two times a day (BID) | INTRAVENOUS | Status: DC

## 2019-09-05 MED ORDER — SODIUM CHLORIDE 0.9% FLUSH: 3.0000 mL | INTRAVENOUS | Status: DC | PRN

## 2019-09-05 MED ORDER — HEPARIN (PORCINE) IN NACL 1000-0.9 UT/500ML-% IV SOLN
INTRAVENOUS | Status: DC | PRN
  Administered 2019-09-05: 500 mL

## 2019-09-05 MED ORDER — LIDOCAINE HCL (PF) 1 % IJ SOLN
INTRAMUSCULAR | Status: AC
  Filled 2019-09-05: qty 30

## 2019-09-05 MED ORDER — HEPARIN (PORCINE) IN NACL 1000-0.9 UT/500ML-% IV SOLN
INTRAVENOUS | Status: AC
  Filled 2019-09-05: qty 500

## 2019-09-05 MED ORDER — HYDRALAZINE HCL 20 MG/ML IJ SOLN: 10.0000 mg | INTRAMUSCULAR | Status: DC | PRN

## 2019-09-05 MED ORDER — SODIUM CHLORIDE 0.9 % IV SOLN: 250.0000 mL | INTRAVENOUS | Status: DC | PRN

## 2019-09-05 MED ORDER — LIDOCAINE HCL (PF) 1 % IJ SOLN
INTRAMUSCULAR | Status: DC | PRN
  Administered 2019-09-05: 2 mL

## 2019-09-05 MED ORDER — ONDANSETRON HCL 4 MG/2ML IJ SOLN: 4.0000 mg | Freq: Four times a day (QID) | INTRAMUSCULAR | Status: DC | PRN

## 2019-09-05 SURGICAL SUPPLY — 7 items
CATH BALLN WEDGE 5F 110CM (CATHETERS) ×2 IMPLANT
PACK CARDIAC CATHETERIZATION (CUSTOM PROCEDURE TRAY) ×2 IMPLANT
SHEATH GLIDE SLENDER 4/5FR (SHEATH) ×2 IMPLANT
STOPCOCK MORSE 400PSI 3WAY (MISCELLANEOUS) ×2 IMPLANT
TRANSDUCER W/STOPCOCK (MISCELLANEOUS) ×2 IMPLANT
TUBING ART PRESS 72  MALE/FEM (TUBING) ×1
TUBING ART PRESS 72 MALE/FEM (TUBING) ×1 IMPLANT

## 2019-09-05 NOTE — Discharge Instructions (Signed)
BrachialSite Care  This sheet gives you information about how to care for yourself after your procedure. Your health care provider may also give you more specific instructions. If you have problems or questions, contact your health care provider. What can I expect after the procedure? After the procedure, it is common to have:  Bruising and tenderness at the catheter insertion area. Follow these instructions at home: Medicines  Take over-the-counter and prescription medicines only as told by your health care provider. Insertion site care  Follow instructions from your health care provider about how to take care of your insertion site. Make sure you: ? Wash your hands with soap and water before you change your bandage (dressing). If soap and water are not available, use hand sanitizer. ? Change your dressing as told by your health care provider. ? Leave stitches (sutures), skin glue, or adhesive strips in place. These skin closures may need to stay in place for 2 weeks or longer. If adhesive strip edges start to loosen and curl up, you may trim the loose edges. Do not remove adhesive strips completely unless your health care provider tells you to do that.  Check your insertion site every day for signs of infection. Check for: ? Redness, swelling, or pain. ? Fluid or blood. ? Pus or a bad smell. ? Warmth.  Do not take baths, swim, or use a hot tub until your health care provider approves.  You may shower 24-48 hours after the procedure, or as directed by your health care provider. ? Remove the dressing and gently wash the site with plain soap and water. ? Pat the area dry with a clean towel. ? Do not rub the site. That could cause bleeding.  Do not apply powder or lotion to the site. Activity   For 24 hours after the procedure, or as directed by your health care provider: ? Do not flex or bend the affected arm. ? Do not push or pull heavy objects with the affected arm. ? Do not  drive yourself home from the hospital or clinic. You may drive 24 hours after the procedure unless your health care provider tells you not to. ? Do not operate machinery or power tools.  Do not lift anything that is heavier than 10 lb (4.5 kg), or the limit that you are told, until your health care provider says that it is safe.  Ask your health care provider when it is okay to: ? Return to work or school. ? Resume usual physical activities or sports. ? Resume sexual activity. General instructions  If the catheter site starts to bleed, raise your arm and put firm pressure on the site. If the bleeding does not stop, get help right away. This is a medical emergency.  If you went home on the same day as your procedure, a responsible adult should be with you for the first 24 hours after you arrive home.  Keep all follow-up visits as told by your health care provider. This is important. Contact a health care provider if:  You have a fever.  You have redness, swelling, or yellow drainage around your insertion site. Get help right away if:  You have unusual pain at the brachial site.  The catheter insertion area swells very fast.  The insertion area is bleeding, and the bleeding does not stop when you hold steady pressure on the area.  Your arm or hand becomes pale, cool, tingly, or numb. These symptoms may represent a serious problem that   is an emergency. Do not wait to see if the symptoms will go away. Get medical help right away. Call your local emergency services (911 in the [place] Do not drive yourself to the hospital. Summary  After the procedure, it is common to have bruising and tenderness at the site.  Follow instructions from your health care provider about how to take care of your radial site wound. Check the wound every day for signs of infection.  Do not lift anything that is heavier than 10 lb (4.5 kg), or the limit that you are told, until your health care provider says  that it is safe. This information is not intended to replace advice given to you by your health care provider. Make sure you discuss any questions you have with your health care provider. Document Revised: 05/30/2017 Document Reviewed: 05/30/2017 Elsevier Patient Education  2020 [employer]  

## 2019-09-05 NOTE — Interval H&P Note (Signed)
History and Physical Interval Note:  09/05/2019 12:33 PM  Karen [name]  has presented today for surgery, with the diagnosis of heart failure/pulmonary HTN.  The various methods of treatment have been discussed with the patient and family. After consideration of risks, benefits and other options for treatment, the patient has consented to  Procedure(s): RIGHT HEART CATH (N/A) as a surgical intervention.  The patient's history has been reviewed, patient examined, no change in status, stable for surgery.  I have reviewed the patient's chart and labs.  Questions were answered to the patient's satisfaction.     Colm Lyford

## 2019-09-09 ENCOUNTER — Ambulatory Visit (INDEPENDENT_AMBULATORY_CARE_PROVIDER_SITE_OTHER): Payer: 59 | Admitting: Internal Medicine

## 2019-09-11 ENCOUNTER — Other Ambulatory Visit: Payer: Self-pay

## 2019-09-11 ENCOUNTER — Ambulatory Visit (INDEPENDENT_AMBULATORY_CARE_PROVIDER_SITE_OTHER): Payer: 59 | Admitting: Internal Medicine

## 2019-09-11 ENCOUNTER — Telehealth (HOSPITAL_COMMUNITY): Payer: Self-pay | Admitting: Pharmacist

## 2019-09-11 ENCOUNTER — Encounter (HOSPITAL_COMMUNITY): Payer: 59

## 2019-09-11 VITALS — BP 120/100 | HR 111 | Temp 97.3°F | Ht 64.0 in | Wt 133.0 lb

## 2019-09-11 DIAGNOSIS — I10 Essential (primary) hypertension: Secondary | ICD-10-CM | POA: Diagnosis not present

## 2019-09-11 DIAGNOSIS — R5381 Other malaise: Secondary | ICD-10-CM

## 2019-09-11 DIAGNOSIS — E559 Vitamin D deficiency, unspecified: Secondary | ICD-10-CM | POA: Diagnosis not present

## 2019-09-11 DIAGNOSIS — R5383 Other fatigue: Secondary | ICD-10-CM

## 2019-09-11 DIAGNOSIS — E039 Hypothyroidism, unspecified: Secondary | ICD-10-CM

## 2019-09-11 LAB — COMPLETE METABOLIC PANEL WITH GFR
AG Ratio: 1.1 (calc) (ref 1.0–2.5)
ALT: 8 U/L (ref 6–29)
AST: 18 U/L (ref 10–35)
Albumin: 4 g/dL (ref 3.6–5.1)
Alkaline phosphatase (APISO): 77 U/L (ref 37–153)
BUN: 10 mg/dL (ref 7–25)
CO2: 30 mmol/L (ref 20–32)
Calcium: 10.7 mg/dL — ABNORMAL HIGH (ref 8.6–10.4)
Chloride: 104 mmol/L (ref 98–110)
Creat: 0.62 mg/dL (ref 0.50–1.05)
GFR, Est African American: 121 mL/min/{1.73_m2} (ref >=60)
GFR, Est Non African American: 104 mL/min/{1.73_m2} (ref >=60)
Globulin: 3.5 g/dL (calc) (ref 1.9–3.7)
Glucose, Bld: 93 mg/dL (ref 65–99)
Potassium: 4.2 mmol/L (ref 3.5–5.3)
Sodium: 144 mmol/L (ref 135–146)
Total Bilirubin: 0.5 mg/dL (ref 0.2–1.2)
Total Protein: 7.5 g/dL (ref 6.1–8.1)

## 2019-09-11 LAB — T3, FREE: T3, Free: 2.8 pg/mL (ref 2.3–4.2)

## 2019-09-11 LAB — VITAMIN D 25 HYDROXY (VIT D DEFICIENCY, FRACTURES): Vit D, 25-Hydroxy: 36 ng/mL (ref 30–100)

## 2019-09-11 MED ORDER — MACITENTAN 10 MG PO TABS: 10.0000 mg | ORAL_TABLET | Freq: Every day | ORAL | 11 refills | Status: DC

## 2019-09-11 MED FILL — NP THYROID 30 MG TABLET: 30 | 30 days supply | Qty: 30 | Fill #1

## 2019-09-11 NOTE — Telephone Encounter (Signed)
Patient Advocate Encounter   Received notification from [employer] that prior authorization for [employer] is required.   PA submitted on CoverMyMeds Key [id number] Status is pending   Will continue to follow.  Audry Riles, PharmD, BCPS, BCCP, CPP [facility] Failure Clinic Pharmacist [phone]

## 2019-09-11 NOTE — Telephone Encounter (Signed)
Received message from Dr. Haroldine Laws that patient will be started on Opsumit. Spoke with patient and she would like me to email her the patient enrollment and REMS enrollment forms. She will sign the forms and then send them back. Once I have received the forms, I will fax them in to [employer]. Of note, patient states she is post-menopausal so a pregnancy test is not needed before initiating therapy. I have provided her with the REMS "Guide for Female Patients".   Audry Riles, PharmD, BCPS, BCCP, CPP Heart Failure Clinic Pharmacist [phone]

## 2019-09-11 NOTE — Progress Notes (Signed)
Metrics: Intervention Frequency ACO  Documented Smoking Status Yearly  Screened one or more times in 24 months  Cessation Counseling or  Active cessation medication Past 24 months  Past 24 months   Guideline developer: UpToDate (See UpToDate for funding source) Date Released: 2014       Wellness Office Visit  Subjective:  Patient ID: Karen [name], female    DOB: 02-Jun-1970  Age: 49 y.o. MRN: [id number]  CC: This lady comes in for follow-up of hypertension, hypothyroidism. HPI  She is being followed by pulmonology and cardiology.  Her scleroderma has had major effects on her interstitial lung disease and also is giving her pulmonary hypertension.  Dr. Haroldine Laws is following her for her pulmonary hypertension and she recently had a cardiac catheterization on the right side. From my point of view, she has tolerated the very small dose of NP thyroid for hypothyroidism.  She continues to take vitamin D3 for vitamin D deficiency. Past Medical History:  Diagnosis Date  . GERD (gastroesophageal reflux disease)   . Hypertension   . Hypothyroidism   . ILD (interstitial lung disease) ([place]) 02/08/2018  . Interstitial lung disease ([place])   . Multinodular thyroid    benign FNA 08/2017.  Marland Kitchen Perimenopause 02/27/2019  . Scleroderma ([place])   . Vitamin D deficiency disease 02/27/2019      Family History  Problem Relation Age of Onset  . Hypertension Father   . Hypertension Sister   . Hypertension Brother   . Diabetes Maternal Grandfather   . Diabetes Maternal Uncle   . Diabetes Mother   . Colon cancer Neg Hx     Social History   Social History Narrative   Patient is right-handed. She lives alone in one level home, a few steps to enter.CMA [facility].   Social History   Tobacco Use  . Smoking status: Never Smoker  . Smokeless tobacco: Never Used  Substance Use Topics  . Alcohol use: No    Current Meds  Medication Sig  . bisoprolol (ZEBETA) 5 MG tablet TAKE ONE TABLET  BY MOUTH DAILY.  Marland Kitchen Cholecalciferol (VITAMIN D-3) 5000 units TABS Take 5,000 Units by mouth daily.   Marland Kitchen esomeprazole (NEXIUM) 40 MG capsule TAKE 1 CAPSULE BY MOUTH TWO TIMES DAILY  . furosemide (LASIX) 40 MG tablet Take 40 mg by mouth daily. Except Saturdays and Sundays  . metoCLOPramide (REGLAN) 5 MG tablet Take 1 tablet (5 mg total) by mouth daily before breakfast.  . Multiple Vitamins-Minerals (MEGA MULTIVITAMIN) POWD Take 1 Scoop by mouth daily. Mix 1 scoop in water  . mycophenolate (CELLCEPT) 500 MG tablet Take 3 tablets by mouth every morning and every evening.  . Nintedanib (OFEV) 150 MG CAPS Take 1 capsule (150 mg total) by mouth 2 (two) times daily.  . ondansetron (ZOFRAN) 4 MG tablet Take 1 tablet (4 mg total) by mouth every 8 (eight) hours as needed for nausea or vomiting.  . potassium chloride 20 MEQ/15ML (10%) SOLN Take 20 mEq by mouth daily. Except on saturdays and sundays  . progesterone (PROMETRIUM) 200 MG capsule Take 1 capsule (200 mg total) by mouth daily.  Marland Kitchen spironolactone (ALDACTONE) 25 MG tablet Take 1 tablet (25 mg total) by mouth daily.  Marland Kitchen sulfamethoxazole-trimethoprim (BACTRIM DS) 800-160 MG tablet Take 1 tablet by mouth daily.  Marland Kitchen thyroid (NP THYROID) 30 MG tablet Take 1 tablet (30 mg total) by mouth daily before breakfast.      Objective:   Today's Vitals: BP (!) 120/100 (BP Location:  Left Arm, Patient Position: Sitting, Cuff Size: Normal)   Pulse (!) 111   Temp (!) 97.3 F (36.3 C) (Temporal)   Ht 5\' 4"  (1.626 m)   Wt 133 lb (60.3 kg)   SpO2 96%   BMI 22.83 kg/m  Vitals with BMI 09/11/2019 09/05/2019 09/05/2019  Height 5\' 4"  - -  Weight 133 lbs - -  BMI 123XX123 - -  Systolic 123456 A999333 -  Diastolic 123XX123 A999333 -  Pulse 111 88 0     Physical Exam   She looks systemically well.  Blood pressure is improved compared to last time.  She is alert and orientated without any focal neurological signs.    Assessment   1. Essential hypertension   2. Malaise and fatigue    3. Vitamin D deficiency disease   4. Hypothyroidism, adult       Tests ordered Orders Placed This Encounter  Procedures  . VITAMIN D 25 Hydroxy (Vit-D Deficiency, Fractures)  . COMPLETE METABOLIC PANEL WITH GFR  . T3, free     Plan: 1. Blood work is taken. 2. She will continue with medications the same for hypertension. 3. She will continue with desiccated NP thyroid and we may need to adjust upwards the dose of the NP thyroid. 4. We will check vitamin D levels and we will see if we need to adjust further. 5. I will see her in about a month's time for close follow-up.   No orders of the defined types were placed in this encounter.   [name], MD

## 2019-09-15 ENCOUNTER — Other Ambulatory Visit (INDEPENDENT_AMBULATORY_CARE_PROVIDER_SITE_OTHER): Payer: Self-pay | Admitting: Internal Medicine

## 2019-09-15 MED ORDER — THYROID 60 MG PO TABS
60.0000 mg | ORAL_TABLET | Freq: Every day | ORAL | 3 refills | Status: DC
Start: 2019-09-15 — End: 2020-02-15

## 2019-09-15 NOTE — Progress Notes (Signed)
Patient called.Pt was left voice mail of results of thyroid and new Rx for pick up to start new Dose.

## 2019-09-15 NOTE — Progress Notes (Signed)
Please call this patient.  Tell her that her thyroid levels are still suboptimal and I am going to send a new prescription for NP thyroid 60 mg tablets, take 1 daily to her pharmacy.  Follow-up as scheduled.

## 2019-09-15 NOTE — Telephone Encounter (Signed)
Advanced Heart Failure Patient Advocate Encounter  Prior Authorization for Opsumit has been approved.   Effective dates: 09/15/19-09/13/20  Audry Riles, PharmD, BCPS, BCCP, CPP Heart Failure Clinic Pharmacist [phone]

## 2019-09-18 ENCOUNTER — Encounter (HOSPITAL_COMMUNITY): Payer: 59

## 2019-09-22 MED FILL — NP THYROID 60 MG TABLET: 60 | 30 days supply | Qty: 30 | Fill #0

## 2019-09-24 NOTE — Telephone Encounter (Signed)
Sent in [facility] Patient Enrollment and REMS enrollment form to [employer]. Application pending. Of note, once approved, patient will need to fill Opsumit at [facility].   Audry Riles, PharmD, BCPS, BCCP, CPP Heart Failure Clinic Pharmacist [phone]

## 2019-09-25 DIAGNOSIS — L989 Disorder of the skin and subcutaneous tissue, unspecified: Secondary | ICD-10-CM | POA: Diagnosis not present

## 2019-09-25 DIAGNOSIS — Z1389 Encounter for screening for other disorder: Secondary | ICD-10-CM | POA: Diagnosis not present

## 2019-09-25 DIAGNOSIS — Z6820 Body mass index (BMI) 20.0-20.9, adult: Secondary | ICD-10-CM | POA: Diagnosis not present

## 2019-09-25 DIAGNOSIS — Z130 Encounter for screening for diseases of the blood and blood-forming organs and certain disorders involving the immune mechanism: Secondary | ICD-10-CM | POA: Diagnosis not present

## 2019-09-25 DIAGNOSIS — D250 Submucous leiomyoma of uterus: Secondary | ICD-10-CM | POA: Diagnosis not present

## 2019-09-25 DIAGNOSIS — Z1231 Encounter for screening mammogram for malignant neoplasm of breast: Secondary | ICD-10-CM | POA: Diagnosis not present

## 2019-09-25 DIAGNOSIS — Z01419 Encounter for gynecological examination (general) (routine) without abnormal findings: Secondary | ICD-10-CM | POA: Diagnosis not present

## 2019-09-25 DIAGNOSIS — R350 Frequency of micturition: Secondary | ICD-10-CM | POA: Diagnosis not present

## 2019-09-29 NOTE — Telephone Encounter (Signed)
Patient received one-time 30-day free Opsumit shipment through voucher program. Dispensed from [facility]. Enrollment still pending.   Audry Riles, PharmD, BCPS, BCCP, CPP Heart Failure Clinic Pharmacist [phone]

## 2019-10-02 ENCOUNTER — Ambulatory Visit: Payer: 59 | Admitting: Pulmonary Disease

## 2019-10-02 DIAGNOSIS — M545 Low back pain: Secondary | ICD-10-CM | POA: Diagnosis not present

## 2019-10-02 DIAGNOSIS — Z6822 Body mass index (BMI) 22.0-22.9, adult: Secondary | ICD-10-CM | POA: Diagnosis not present

## 2019-10-02 DIAGNOSIS — M349 Systemic sclerosis, unspecified: Secondary | ICD-10-CM | POA: Diagnosis not present

## 2019-10-02 DIAGNOSIS — I7300 Raynaud's syndrome without gangrene: Secondary | ICD-10-CM | POA: Diagnosis not present

## 2019-10-02 DIAGNOSIS — J849 Interstitial pulmonary disease, unspecified: Secondary | ICD-10-CM | POA: Diagnosis not present

## 2019-10-02 DIAGNOSIS — M791 Myalgia, unspecified site: Secondary | ICD-10-CM | POA: Diagnosis not present

## 2019-10-16 ENCOUNTER — Ambulatory Visit (INDEPENDENT_AMBULATORY_CARE_PROVIDER_SITE_OTHER): Payer: 59 | Admitting: Internal Medicine

## 2019-10-16 ENCOUNTER — Other Ambulatory Visit (INDEPENDENT_AMBULATORY_CARE_PROVIDER_SITE_OTHER): Payer: Self-pay | Admitting: Internal Medicine

## 2019-10-16 ENCOUNTER — Encounter (INDEPENDENT_AMBULATORY_CARE_PROVIDER_SITE_OTHER): Payer: Self-pay | Admitting: Internal Medicine

## 2019-10-16 ENCOUNTER — Other Ambulatory Visit: Payer: Self-pay

## 2019-10-16 VITALS — BP 145/100 | Temp 96.9°F | Ht 65.0 in | Wt 131.2 lb

## 2019-10-16 DIAGNOSIS — E039 Hypothyroidism, unspecified: Secondary | ICD-10-CM

## 2019-10-16 DIAGNOSIS — E559 Vitamin D deficiency, unspecified: Secondary | ICD-10-CM | POA: Diagnosis not present

## 2019-10-16 DIAGNOSIS — I10 Essential (primary) hypertension: Secondary | ICD-10-CM | POA: Diagnosis not present

## 2019-10-16 MED ORDER — THYROID 90 MG PO TABS
90.0000 mg | ORAL_TABLET | Freq: Every day | ORAL | 3 refills | Status: DC
Start: 2019-10-16 — End: 2019-11-20

## 2019-10-16 NOTE — Progress Notes (Signed)
Metrics: Intervention Frequency ACO  Documented Smoking Status Yearly  Screened one or more times in 24 months  Cessation Counseling or  Active cessation medication Past 24 months  Past 24 months   Guideline developer: UpToDate (See UpToDate for funding source) Date Released: 2014       Wellness Office Visit  Subjective:  Patient ID: Karen [name], female    DOB: 07-Aug-1970  Age: 49 y.o. MRN: [id number]  CC: This lady comes in for follow-up of hypothyroidism, vitamin D deficiency and hypertension. HPI  She is doing reasonably well but on the last visit I increased her NP thyroid to 60 mg daily.  She still has very low energy levels and did not notice any change in how she feels with a higher dose of NP thyroid. She continues on bisoprolol for hypertension.  She is tolerating this reasonably well. She continues on vitamin D3 5000 units daily for vitamin D deficiency.  Her last blood work showed levels of only 36, still suboptimal.  Her calcium was slightly elevated.  She is not have any symptoms due to hypercalcemia. Past Medical History:  Diagnosis Date  . GERD (gastroesophageal reflux disease)   . Hypertension   . Hypothyroidism   . ILD (interstitial lung disease) ([place]) 02/08/2018  . Interstitial lung disease ([place])   . Multinodular thyroid    benign FNA 08/2017.  Marland Kitchen Perimenopause 02/27/2019  . Scleroderma ([place])   . Vitamin D deficiency disease 02/27/2019   Past Surgical History:  Procedure Laterality Date  . ABDOMINAL HERNIA REPAIR    . BUNIONECTOMY Bilateral 10/2018  . COLONOSCOPY WITH PROPOFOL N/A 01/02/2019   Procedure: COLONOSCOPY WITH PROPOFOL;  Surgeon: Daneil Dolin, MD;  [facility];  Service: Endoscopy;  Laterality: N/A;  12:30pm  . ESOPHAGOGASTRODUODENOSCOPY (EGD) WITH PROPOFOL N/A 01/28/2018   erosive reflux esophagitis, patulous EG Junction, no dilation, incomplete EGD due to retained food in stomach. GES thereafter with delayed gastric emptying.   Marland Kitchen  RIGHT HEART CATH N/A 09/27/2017   Procedure: RIGHT HEART CATH;  Surgeon: Jolaine Artist, MD;  Location: [facility];  Service: Cardiovascular;  Laterality: N/A;  . RIGHT HEART CATH N/A 09/05/2019   Procedure: RIGHT HEART CATH;  Surgeon: Jolaine Artist, MD;  Location: [facility];  Service: Cardiovascular;  Laterality: N/A;  . UTERINE FIBROID SURGERY       Family History  Problem Relation Age of Onset  . Hypertension Father   . Hypertension Sister   . Hypertension Brother   . Diabetes Maternal Grandfather   . Diabetes Maternal Uncle   . Diabetes Mother   . Colon cancer Neg Hx     Social History   Social History Narrative   Patient is right-handed. She lives alone in one level home, a few steps to enter.CMA [facility].   Social History   Tobacco Use  . Smoking status: Never Smoker  . Smokeless tobacco: Never Used  Substance Use Topics  . Alcohol use: No    Current Meds  Medication Sig  . bisoprolol (ZEBETA) 5 MG tablet TAKE ONE TABLET BY MOUTH DAILY.  Marland Kitchen Cholecalciferol (VITAMIN D-3) 5000 units TABS Take 5,000 Units by mouth daily.   Marland Kitchen esomeprazole (NEXIUM) 40 MG capsule TAKE 1 CAPSULE BY MOUTH TWO TIMES DAILY  . furosemide (LASIX) 40 MG tablet Take 40 mg by mouth daily. Except Saturdays and Sundays  . macitentan (OPSUMIT) 10 MG tablet Take 1 tablet (10 mg total) by mouth daily.  Marland Kitchen  Multiple Vitamins-Minerals (MEGA MULTIVITAMIN) POWD Take 1 Scoop by mouth daily. Mix 1 scoop in water  . mycophenolate (CELLCEPT) 500 MG tablet Take 3 tablets by mouth every morning and every evening.  . Nintedanib (OFEV) 150 MG CAPS Take 1 capsule (150 mg total) by mouth 2 (two) times daily.  . potassium chloride 20 MEQ/15ML (10%) SOLN Take 20 mEq by mouth daily. Except on saturdays and sundays  . progesterone (PROMETRIUM) 200 MG capsule Take 1 capsule (200 mg total) by mouth daily.  Marland Kitchen spironolactone (ALDACTONE) 25 MG tablet Take 1 tablet (25 mg total) by mouth  daily.  Marland Kitchen sulfamethoxazole-trimethoprim (BACTRIM DS) 800-160 MG tablet Take 1 tablet by mouth daily.  Marland Kitchen thyroid (NP THYROID) 60 MG tablet Take 1 tablet (60 mg total) by mouth daily before breakfast.      Depression screen [facility] 2/9 06/19/2019 08/30/2017  Decreased Interest 0 0  Down, Depressed, Hopeless 0 0  PHQ - 2 Score 0 0  Altered sleeping 1 -  Tired, decreased energy 2 -  Change in appetite 0 -  Feeling bad or failure about yourself  0 -  Trouble concentrating 0 -  Moving slowly or fidgety/restless 0 -  Suicidal thoughts 0 -  PHQ-9 Score 3 -  Difficult doing work/chores Somewhat difficult -     Objective:   Today's Vitals: BP (!) 145/100 (BP Location: Left Arm, Patient Position: Sitting, Cuff Size: Normal)   Temp (!) 96.9 F (36.1 C) (Temporal)   Ht 5\' 5"  (1.651 m)   Wt 131 lb 3.2 oz (59.5 kg)   BMI 21.83 kg/m  Vitals with BMI 10/16/2019 09/11/2019 09/05/2019  Height 5\' 5"  5\' 4"  -  Weight 131 lbs 3 oz 133 lbs -  BMI 40.98 11.91 -  Systolic 478 295 621  Diastolic 308 657 846  Pulse - 111 88     Physical Exam  She looks systemically well.  Her weight is stable.  Blood pressure is somewhat elevated today.  She is alert and orientated without any focal neurological signs.     Assessment   1. Hypothyroidism, adult   2. Essential hypertension   3. Vitamin D deficiency disease       Tests ordered No orders of the defined types were placed in this encounter.    Plan: 1. I think we can afford to try and further optimize her thyroid so I am sending prescription for NP thyroid 90 mg daily and she will take this as soon as she gets it from the pharmacy. 2. She will continue with the same dose of bisoprolol for the time being but we will have to closely monitor her hypertension. 3. I have told to increase the dose of vitamin D3 to 10,000 units daily and also advised her to make sure she drinks plenty of water every day. 4. I will see her in about 4 to 6 weeks time to  see how she is doing and at that time which she will do blood work to check for thyroid, vitamin D levels and calcium levels.   Meds ordered this encounter  Medications  . thyroid (NP THYROID) 90 MG tablet    Sig: Take 1 tablet (90 mg total) by mouth daily.    Dispense:  30 tablet    Refill:  3    Zyanya Glaza Luther Parody, MD

## 2019-10-17 NOTE — Telephone Encounter (Signed)
Enrollment completed for Opsumit.

## 2019-10-21 MED FILL — BISOPROLOL FUMARATE 5 MG TA: 5 | 30 days supply | Qty: 30 | Fill #1

## 2019-10-22 ENCOUNTER — Telehealth (HOSPITAL_COMMUNITY): Payer: Self-pay | Admitting: *Deleted

## 2019-10-22 NOTE — Telephone Encounter (Signed)
Received a VM that pt needed a return call about a medication. I called pt back no answer/left vm for return call.

## 2019-10-22 NOTE — Telephone Encounter (Signed)
Pt left another VM at 3:08 PM stating she ha a question about prescriptions and needed a call back.  Attempted to call pt back and had to leave a message for her to call us back.

## 2019-10-23 NOTE — Telephone Encounter (Signed)
Pt left vm returning call. I called pt back and no answer/left vm requesting she leave a detailed message.

## 2019-10-24 ENCOUNTER — Telehealth (HOSPITAL_COMMUNITY): Payer: Self-pay | Admitting: Pharmacy Technician

## 2019-10-24 ENCOUNTER — Encounter (HOSPITAL_COMMUNITY): Payer: Self-pay

## 2019-10-24 NOTE — Telephone Encounter (Signed)
Patient called, her co-pay of Opsumit is $250.00 which is unaffordable for her.  Was able to obtain patient a $5 co-pay card. Pharmacy Billing Information  [facility]: [id number]  PCN: [id number]  Group: [id number]  ID: [id number]  Will call [employer] and provide co-pay card information.  Charlann Boxer, CPhT

## 2019-10-27 NOTE — Telephone Encounter (Signed)
Spoke with patient, emailed her billing information for Opsumit co-pay card. Also informed her of updated $5 co-pay.  Karen [name], CPhT

## 2019-11-19 ENCOUNTER — Ambulatory Visit: Payer: 59 | Admitting: Nurse Practitioner

## 2019-11-19 ENCOUNTER — Other Ambulatory Visit (HOSPITAL_COMMUNITY): Payer: Self-pay | Admitting: Internal Medicine

## 2019-11-19 ENCOUNTER — Other Ambulatory Visit: Payer: Self-pay | Admitting: Pulmonary Disease

## 2019-11-19 MED FILL — FUROSEMIDE 40 MG TAB: 40 | 30 days supply | Qty: 30 | Fill #2

## 2019-11-20 ENCOUNTER — Ambulatory Visit (INDEPENDENT_AMBULATORY_CARE_PROVIDER_SITE_OTHER): Payer: 59 | Admitting: Internal Medicine

## 2019-11-20 ENCOUNTER — Other Ambulatory Visit: Payer: Self-pay

## 2019-11-20 ENCOUNTER — Ambulatory Visit (INDEPENDENT_AMBULATORY_CARE_PROVIDER_SITE_OTHER): Payer: 59 | Admitting: Pulmonary Disease

## 2019-11-20 ENCOUNTER — Encounter: Payer: Self-pay | Admitting: Pulmonary Disease

## 2019-11-20 ENCOUNTER — Other Ambulatory Visit (HOSPITAL_COMMUNITY): Payer: Self-pay | Admitting: Internal Medicine

## 2019-11-20 VITALS — BP 118/64 | HR 80 | Temp 98.1°F | Ht 65.0 in | Wt 130.8 lb

## 2019-11-20 DIAGNOSIS — M349 Systemic sclerosis, unspecified: Secondary | ICD-10-CM | POA: Diagnosis not present

## 2019-11-20 DIAGNOSIS — Z5181 Encounter for therapeutic drug level monitoring: Secondary | ICD-10-CM

## 2019-11-20 DIAGNOSIS — J849 Interstitial pulmonary disease, unspecified: Secondary | ICD-10-CM

## 2019-11-20 MED ORDER — OFEV 150 MG PO CAPS: 150.0000 mg | ORAL_CAPSULE | Freq: Two times a day (BID) | ORAL | 2 refills | Status: DC

## 2019-11-20 MED ORDER — MYCOPHENOLATE MOFETIL 500 MG PO TABS: ORAL_TABLET | ORAL | 1 refills | Status: DC

## 2019-11-20 MED ORDER — SULFAMETHOXAZOLE-TRIMETHOPRIM 800-160 MG PO TABS: 1.0000 | ORAL_TABLET | Freq: Every day | ORAL | 3 refills | Status: DC

## 2019-11-20 MED FILL — MYCOPHENOLATE 500 MG TABLET: 500 | 28 days supply | Qty: 170 | Fill #0

## 2019-11-20 MED FILL — SPIRONOLACTONE 25 MG TABS: 25 | 30 days supply | Qty: 30 | Fill #0

## 2019-11-20 MED FILL — SULFAMETHOXAZOLE-TMP DS TAB: 800-160 | 30 days supply | Qty: 30 | Fill #0

## 2019-11-20 NOTE — Addendum Note (Signed)
Addended by: Elton Sin on: 11/20/2019 03:27 PM   Modules accepted: Orders

## 2019-11-20 NOTE — Progress Notes (Signed)
Karen [name]    [id number]    Dec 09, 1970  Primary Care Physician:Gosrani, Doristine Johns, MD  Referring Physician: [name], MD [address] 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 Haroldine Laws on 09/27/17 with no evidence of pulmonary hypertension Started on CellCept May 2019 and uptitrated to max dose of 1.5 mg twice daily.  She had a hospitalization in [facility] in March 2019 for hypertension.  A CT chest scan at that time showed subtle lower lung findings of atelectasis, groundglass.  She has a right thyroid nodule which was biopsied on 08/15/17 with pathology showing benign findings.  Thyroid function tests are normal.  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  Reviewed note from Dr. Trudie Reed, rheumatology 07/12/2017 Seen for joint pain, Raynaud's, elevated CK, skin thickening Serologies positive for Sjogren's negative,, normal CK and aldolase.  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  No mold. No hot tub, Jacuzzi. She has down comforter.  Smoking history: Never smoker Travel history: Grew up in [place].  Lived in [place] and [place] Relevant family history: No family history of lung disease.    Interim history: Continues on CellCept 1.5 mg twice daily and Ofev Finished pulmonary rehab and is back at work full-time Repeat right  heart cath by Dr. Haroldine Laws showed mild pulmonary hypertension she has been started on  macitetan  Outpatient Encounter Medications as of 11/20/2019  Medication Sig  . bisoprolol (ZEBETA) 5 MG tablet TAKE ONE TABLET BY MOUTH DAILY.  Marland Kitchen Cholecalciferol (VITAMIN D-3) 5000 units TABS Take 10,000 Units by mouth daily.   Marland Kitchen esomeprazole (NEXIUM) 40 MG capsule TAKE 1 CAPSULE BY MOUTH TWO TIMES DAILY  . furosemide (LASIX) 40 MG tablet Take 40 mg by mouth daily. Except Saturdays and Sundays  . macitentan (OPSUMIT) 10 MG tablet Take 1 tablet (10 mg total) by mouth daily.  . Multiple Vitamins-Minerals (MEGA MULTIVITAMIN) POWD Take 1 Scoop by mouth daily. Mix 1 scoop in water  . mycophenolate (CELLCEPT) 500 MG tablet TAKE 3 TABLETS BY MOUTH IN THE MORNING AND 3 TABLETS IN THE EVENING  . Nintedanib (OFEV) 150 MG CAPS Take 1 capsule (150 mg total) by mouth 2 (two) times daily.  . potassium chloride 20 MEQ/15ML (10%) SOLN Take 20 mEq by mouth daily. Except on saturdays and sundays  . spironolactone (ALDACTONE) 25 MG tablet TAKE 1 TABLET BY MOUTH DAILY.  Marland Kitchen sulfamethoxazole-trimethoprim (BACTRIM DS) 800-160 MG tablet TAKE 1 TABLET BY MOUTH DAILY  . thyroid (NP THYROID) 60 MG tablet Take 1 tablet (60 mg total) by mouth daily before breakfast.  . [DISCONTINUED] metoCLOPramide (REGLAN) 5 MG tablet Take 1 tablet (5 mg total) by mouth daily before breakfast. (Patient not taking: Reported on 10/16/2019)  . [DISCONTINUED] progesterone (PROMETRIUM) 200 MG capsule Take 1 capsule (200 mg total) by mouth daily.  . [DISCONTINUED] thyroid (NP THYROID) 90 MG tablet Take 1 tablet (90 mg total)  by mouth daily.   No facility-administered encounter medications on file as of 11/20/2019.    Physical Exam: Blood pressure 118/64, pulse 80, temperature 98.1 F (36.7 C), temperature source Oral, height 5\' 5"  (1.651 m), weight 130 lb 12.8 oz (59.3 kg), SpO2 98 %. Gen:      No acute distress HEENT:  EOMI, sclera anicteric Neck:     No  masses; no thyromegaly Lungs:    Clear to auscultation bilaterally; normal respiratory effort CV:         Regular rate and rhythm; no murmurs Abd:      + bowel sounds; soft, non-tender; no palpable masses, no distension Ext:    No edema; adequate peripheral perfusion Skin:      Warm and dry; no rash Neuro: alert and oriented x 3 Psych: normal mood and affect  Data Reviewed: Imaging CT chest, [facility] 07/24/2017 Dependent atelectasis, subtle groundglass opacities at the base.  2.5 cm right thyroid nodule.  I have reviewed the images personally  CT high-resolution 09/18/2017- patchy reticular groundglass opacities, reticulation with subpleural sparing.  Mild traction bronchiectasis.  No honeycombing.  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  Aortic atherosclerosis, three-vessel coronary artery disease.  CT high-resolution 06/27/2018-stable interstitial lung disease.  CT high-resolution 06/11/2019-stable interstitial lung disease I have reviewed the images personally.  PFTs FENO 09/06/2017-unable to complete  07/27/2017 FVC 1.65 (54%), FEV1 1.53 (62%], F/F 93, TLC 50, DLCO 44%, DLCO/VA 131%  10/25/2017 FVC 1.51 [50%], FEV1 1.26 [51%], F/F 83  01/29/2018 FVC 1.69 [5%), FEV1 1.50 [61%], F/F 89, DLCO 10.57 [41%]  07/01/2018 FVC 1.57 [52%), FEV1 1.47 [60%], F/F 93, DLCO 12.54 [59%)  01/06/2019 FVC 1.58 [52%], FEV1 1.46 [60%], F/F 92, TLC 2.62 [50%], DLCO 9.16 [42%] Severe restriction and diffusion impairment.  FVC is stable but DLCO has worsened.  6-minute walk  10/23/2017- 144 m Post walk heart rate, stats 94, 91%  02/04/2018- 249 m Post walk heart rate, sats 101, 99%  06/06/2018-212 m Post walk heart rate, sats 86, 92%  01/30/2019-124m Post walk heart rate, sats 83, 98%  Labs 08/20/2017-ANA, centromere, rheumatoid factor, SCL 70-negative QuantiFERON 09/12/2017-negative Hepatitis B, C screening  09/21/2017-negative G6PD 09/25/2017-13  Labs from Dr. Trudie Reed 07/12/17 Rheumatoid factor < 10, CCP-8, ANCA-negative, double-stranded DNA-negative, Smith antibody-negative, SCL 70-negative, RNP-negative SSA 5.8, SSB 1.5 Smith antibody-negative, ANA IFA-negative Myositis panel-negative CK 165, C4-30, C3-168, total complement greater than 60, aldolase 8.8 Sed rate 9, CRP 0.9  CBC, hepatic function panel 06/17/2018-within normal limits.  Cardiac Cardiac cath 09/27/17 PA = 32/10 (19) PCW = 6 Fick cardiac output/index = 5.0/3.0 PVR = 2.6  Cardiac cath 09/05/2019 PA = 38/16 (25) PCW = 6 Fick cardiac output/index = 5.5/3.4 PVR = 3.4 WU Mild pulmonary hypertension  Overnight oximetry 01/30/2019- test time 8 hours 25 minutes Lowest O2 sat 87%.  Mean O2 sat 95%.  Time spent less than 88% - 1 minute 30 seconds No significant desaturation.  Does not need supplemental oxygen.  Assessment:  Scleroderma ILD Continue on CellCept 1.5 g twice daily, Bactrim prophylaxis and ofev Clinically she is stable but PFTs show reduction in DLCO and there is reduction in 6-minute walk test  Completed pulmonary rehab Follow-up at [facility] transplant.  She is still early stage with regard to transplant but it is good that she has established with them now Follow hepatic panel today.  Repeat 6-minute walk, high-res CT and PFTs in 6 months.  Mild  pulmonary hypertension This is a new finding this year and has been started on acitretin.  Follows with Dr. Haroldine Laws  Esophageal dysfunction, esophageal stricture, GERD Continue on Protonix 20 mg twice daily Follow[facility] with [facility]ology  Plan/Recommendations: - Continue CellCept at current dose of 1.5 twice daily. - Continue Ofev - Hepatic panel - Bactrim prophylaxis.  Marshell Garfinkel MD [facility] 11/20/2019, 3:05 PM

## 2019-11-20 NOTE — Patient Instructions (Addendum)
We will get comprehensive metabolic panel today for monitoring of liver Renew Ofev, CellCept and Bactrim  6-minute walk, CT chest and PFTs in 6 months Follow-up in 6 months.

## 2019-11-20 NOTE — Addendum Note (Signed)
Addended by: Elton Sin on: 11/20/2019 03:33 PM   Modules accepted: Orders

## 2019-11-20 NOTE — Addendum Note (Signed)
Addended by: Suzzanne Cloud E on: 11/20/2019 03:25 PM   Modules accepted: Orders

## 2019-11-20 NOTE — Progress Notes (Signed)
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 Haroldine Laws on 09/27/17 with no evidence of pulmonary hypertension Started on CellCept May 2019 and uptitrated to max dose of 1.5 mg twice daily.  She had a hospitalization in [facility] in March 2019 for hypertension.  A CT chest scan at that time showed subtle lower lung findings of atelectasis, groundglass.  She has a right thyroid nodule which was biopsied on 08/15/17 with pathology showing benign findings.  Thyroid function tests are normal.  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  Reviewed note from Dr. Trudie Reed, rheumatology 07/12/2017 Seen for joint pain, Raynaud's, elevated CK, skin thickening Serologies positive for Sjogren's negative,, normal CK and aldolase.  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  No mold. No hot tub, Jacuzzi. She has down comforter.  Smoking history: Never smoker Travel history: Grew up in [place].  Lived in [place] and [place] Relevant family history: No family history of lung disease.    Interim history: Continues on CellCept 1.5 mg twice daily and Ofev Currently undergoing pulmonary rehab and is set to graduate today.  States that rehab has improved her symptoms Due to get repeat right heart cath by Dr. Haroldine Laws  later this  month.  Outpatient Encounter Medications as of 11/20/2019  Medication Sig  . bisoprolol (ZEBETA) 5 MG tablet TAKE ONE TABLET BY MOUTH DAILY.  Marland Kitchen Cholecalciferol (VITAMIN D-3) 5000 units TABS Take 10,000 Units by mouth daily.   Marland Kitchen esomeprazole (NEXIUM) 40 MG capsule TAKE 1 CAPSULE BY MOUTH TWO TIMES DAILY  . furosemide (LASIX) 40 MG tablet Take 40 mg by mouth daily. Except Saturdays and Sundays  . macitentan (OPSUMIT) 10 MG tablet Take 1 tablet (10 mg total) by mouth daily.  . Multiple Vitamins-Minerals (MEGA MULTIVITAMIN) POWD Take 1 Scoop by mouth daily. Mix 1 scoop in water  . mycophenolate (CELLCEPT) 500 MG tablet TAKE 3 TABLETS BY MOUTH IN THE MORNING AND 3 TABLETS IN THE EVENING  . Nintedanib (OFEV) 150 MG CAPS Take 1 capsule (150 mg total) by mouth 2 (two) times daily.  . potassium chloride 20 MEQ/15ML (10%) SOLN Take 20 mEq by mouth daily. Except on saturdays and sundays  . spironolactone (ALDACTONE) 25 MG tablet TAKE 1 TABLET BY MOUTH DAILY.  Marland Kitchen sulfamethoxazole-trimethoprim (BACTRIM DS) 800-160 MG tablet TAKE 1 TABLET BY MOUTH DAILY  . thyroid (NP THYROID) 60 MG tablet Take 1 tablet (60 mg total) by mouth daily before breakfast.  . [DISCONTINUED] metoCLOPramide (REGLAN) 5 MG tablet Take 1 tablet (5 mg total) by mouth daily before breakfast. (Patient not taking: Reported on 10/16/2019)  . [DISCONTINUED] progesterone (PROMETRIUM) 200 MG capsule Take 1 capsule (200 mg total) by mouth daily.  . [DISCONTINUED] thyroid (NP THYROID) 90 MG tablet Take 1 tablet (90 mg total) by mouth daily.   No facility-administered encounter medications on file as  of 11/20/2019.    Physical Exam: Blood pressure 118/80, pulse 94, temperature 98.2 F (36.8 C), temperature source Temporal, height 5\' 4"  (1.626 m), weight 135 lb 3.2 oz (61.3 kg), SpO2 100 %. Gen:      No acute distress HEENT:  EOMI, sclera anicteric Neck:     No masses; no thyromegaly Lungs:    Clear to auscultation bilaterally; normal  respiratory effort CV:         Regular rate and rhythm; no murmurs Abd:      + bowel sounds; soft, non-tender; no palpable masses, no distension Ext:    No edema; adequate peripheral perfusion Skin:      Warm and dry; no rash Neuro: alert and oriented x 3 Psych: normal mood and affect  Data Reviewed: Imaging CT chest, [facility] 07/24/2017 Dependent atelectasis, subtle groundglass opacities at the base.  2.5 cm right thyroid nodule.  I have reviewed the images personally  CT high-resolution 09/18/2017- patchy reticular groundglass opacities, reticulation with subpleural sparing.  Mild traction bronchiectasis.  No honeycombing.  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  Aortic atherosclerosis, three-vessel coronary artery disease.  CT high-resolution 06/27/2018-stable interstitial lung disease.  CT high-resolution 06/11/2019-stable interstitial lung disease I have reviewed the images personally.  PFTs FENO 09/06/2017-unable to complete  07/27/2017 FVC 1.65 (54%), FEV1 1.53 (62%], F/F 93, TLC 50, DLCO 44%, DLCO/VA 131%  10/25/2017 FVC 1.51 [50%], FEV1 1.26 [51%], F/F 83  01/29/2018 FVC 1.69 [5%), FEV1 1.50 [61%], F/F 89, DLCO 10.57 [41%]  07/01/2018 FVC 1.57 [52%), FEV1 1.47 [60%], F/F 93, DLCO 12.54 [59%)  01/06/2019 FVC 1.58 [52%], FEV1 1.46 [60%], F/F 92, TLC 2.62 [50%], DLCO 9.16 [42%] Severe restriction and diffusion impairment.  FVC is stable but DLCO has worsened.  6-minute walk  10/23/2017- 144 m Post walk heart rate, stats 94, 91%  02/04/2018- 249 m Post walk heart rate, sats 101, 99%  06/06/2018-212 m Post walk heart rate, sats 86, 92%  01/30/2019-180m Post walk heart rate, sats 83, 98%  Labs 08/20/2017-ANA, centromere, rheumatoid factor, SCL 70-negative QuantiFERON 09/12/2017-negative Hepatitis B, C screening 09/21/2017-negative G6PD 09/25/2017-13  Labs from Dr. Trudie Reed 07/12/17 Rheumatoid  factor < 10, CCP-8, ANCA-negative, double-stranded DNA-negative, Smith antibody-negative, SCL 70-negative, RNP-negative SSA 5.8, SSB 1.5 Smith antibody-negative, ANA IFA-negative Myositis panel-negative CK 165, C4-30, C3-168, total complement greater than 60, aldolase 8.8 Sed rate 9, CRP 0.9  CBC, hepatic function panel 06/17/2018-within normal limits.  Cardiac Cardiac cath 09/27/17 RA = 2 RV = 33/6 PA = 32/10 (19) PCW = 6 Fick cardiac output/index = 5.0/3.0 PVR = 2.6 Ao sat = 99% PA sat = 74%, 75%  Overnight oximetry 01/30/2019- test time 8 hours 25 minutes Lowest O2 sat 87%.  Mean O2 sat 95%.  Time spent less than 88% - 1 minute 30 seconds No significant desaturation.  Does not need supplemental oxygen.  Assessment:  Scleroderma ILD Continue on CellCept 1.5 g twice daily, Bactrim prophylaxis.   Clinically she is stable but PFTs show reduction in DLCO and there is reduction in 6-minute walk test No evidence of pulmonary hypertension on right heart catheterization in the past.  With worsening PFTs we have started her on Ofev in December 2020 CT shows stable pulmonary fibrosis after Ofev initiation Continue pulmonary rehab Follow-up at [facility] transplant.  She is still early stage with regard to transplant but it is good that she has established with them now Follow hepatic panel today.  Esophageal dysfunction, esophageal stricture, GERD Continue on Protonix 20 mg twice daily Follows with [facility] gastroenterology  Plan/Recommendations: - Continue CellCept at current dose of 1.5 twice daily. - Continue Ofev - Hepatic panel - Bactrim prophylaxis. - Pulmonary rehab  Marshell Garfinkel MD [facility] 11/20/2019, 3:07 PM

## 2019-11-20 NOTE — Addendum Note (Signed)
Addended by: Elton Sin on: 11/20/2019 03:31 PM   Modules accepted: Orders

## 2019-11-21 LAB — COMPREHENSIVE METABOLIC PANEL
ALT: 13 U/L (ref 0–35)
AST: 21 U/L (ref 0–37)
Albumin: 4.5 g/dL (ref 3.5–5.2)
Alkaline Phosphatase: 82 U/L (ref 39–117)
BUN: 18 mg/dL (ref 6–23)
CO2: 30 mEq/L (ref 19–32)
Calcium: 10.9 mg/dL — ABNORMAL HIGH (ref 8.4–10.5)
Chloride: 101 mEq/L (ref 96–112)
Creatinine, Ser: 0.83 mg/dL (ref 0.40–1.20)
GFR: 87.51 mL/min (ref >60.00)
Glucose, Bld: 89 mg/dL (ref 70–99)
Potassium: 4.2 mEq/L (ref 3.5–5.1)
Sodium: 137 mEq/L (ref 135–145)
Total Bilirubin: 0.4 mg/dL (ref 0.2–1.2)
Total Protein: 8.3 g/dL (ref 6.0–8.3)

## 2019-11-25 ENCOUNTER — Ambulatory Visit: Payer: 59 | Admitting: Nurse Practitioner

## 2019-11-27 ENCOUNTER — Ambulatory Visit (INDEPENDENT_AMBULATORY_CARE_PROVIDER_SITE_OTHER): Payer: 59 | Admitting: Internal Medicine

## 2019-12-25 ENCOUNTER — Other Ambulatory Visit: Payer: Self-pay

## 2019-12-25 ENCOUNTER — Encounter (HOSPITAL_COMMUNITY): Payer: Self-pay | Admitting: Internal Medicine

## 2019-12-25 ENCOUNTER — Ambulatory Visit (HOSPITAL_COMMUNITY)
Admission: RE | Admit: 2019-12-25 | Discharge: 2019-12-25 | Disposition: A | Discharge status: Home / Self Care | Payer: 59 | Source: Ambulatory Visit | Attending: Internal Medicine | Admitting: Internal Medicine

## 2019-12-25 VITALS — BP 140/92 | HR 74 | Ht 65.0 in | Wt 132.6 lb

## 2019-12-25 DIAGNOSIS — I110 Hypertensive heart disease with heart failure: Secondary | ICD-10-CM | POA: Diagnosis not present

## 2019-12-25 DIAGNOSIS — M349 Systemic sclerosis, unspecified: Secondary | ICD-10-CM | POA: Diagnosis not present

## 2019-12-25 DIAGNOSIS — Z888 Allergy status to other drugs, medicaments and biological substances status: Secondary | ICD-10-CM | POA: Insufficient documentation

## 2019-12-25 DIAGNOSIS — E039 Hypothyroidism, unspecified: Secondary | ICD-10-CM | POA: Insufficient documentation

## 2019-12-25 DIAGNOSIS — J8410 Pulmonary fibrosis, unspecified: Secondary | ICD-10-CM | POA: Diagnosis not present

## 2019-12-25 DIAGNOSIS — M3500 Sicca syndrome, unspecified: Secondary | ICD-10-CM | POA: Insufficient documentation

## 2019-12-25 DIAGNOSIS — Z8249 Family history of ischemic heart disease and other diseases of the circulatory system: Secondary | ICD-10-CM | POA: Diagnosis not present

## 2019-12-25 DIAGNOSIS — I10 Essential (primary) hypertension: Secondary | ICD-10-CM | POA: Diagnosis not present

## 2019-12-25 DIAGNOSIS — I5032 Chronic diastolic (congestive) heart failure: Secondary | ICD-10-CM

## 2019-12-25 DIAGNOSIS — Z79899 Other long term (current) drug therapy: Secondary | ICD-10-CM | POA: Diagnosis not present

## 2019-12-25 DIAGNOSIS — J849 Interstitial pulmonary disease, unspecified: Secondary | ICD-10-CM

## 2019-12-25 DIAGNOSIS — K219 Gastro-esophageal reflux disease without esophagitis: Secondary | ICD-10-CM | POA: Insufficient documentation

## 2019-12-25 DIAGNOSIS — I272 Pulmonary hypertension, unspecified: Secondary | ICD-10-CM

## 2019-12-25 NOTE — Progress Notes (Signed)
Heart Failure Clinic Note  Date:  12/25/2019   ID:  Karen [name], DOB [birth year], MRN [id number]  Location: Home  Provider location: [facility] Type of Visit: Established patient  PCP:  [name], MD  Cardiologist:  Mertie Moores, MD Primary HF: Mallika Sanmiguel  Chief Complaint: Heart Failure follow-up   History of Present Illness:  HPI: Karen [name] is a 49 y.o. female [place] [facility] (with [name]) with a history of Sjogren's syndrome, multinodular goiter, HTN and recently diagnosed scleroderma referred by Dr. Gavin Pound for evaluation of pulmonary HTN in the setting of scleroderma.  RHC in 5/19 with minimally elevated pressures and hi-res CT which showed ILD. Follows with Dr. Vaughan Browner . F/u CT in 2/20 showed stable ILD  We saw her in 9/20 markedly SOB and ReDS very high @ 49%. CXR with mild pulmonary vascular congestion. ESR normal. Started lasix 40 daily and kcl 20 daily. Fluid got much better. Then switched to lasix 40 mg MWF but that wasn't enough so lasix increased to 5 days per week.   Started macitentan in May 21 after RHC.   Returns for f/u. Feeling better. Back to working FT. On Cellcept. Volume status doing well. Feels macitentan has helped some. SOB is improved. Has persistent dry cough.   H-res CT 2/21: Stable ILD  Studies:  RHC 4/21  RA = 1 RV = 37/7 PA = 38/16 (25) PCW = 6 Fick cardiac output/index = 5.5/3.4 PVR = 3.4 WU Ao sat = 97% PA sat = 74%, 74% High SVC = 70%  Echo 11/18/18: EF hyperdynamic 65-70% RV normal   Echo 5/19: EF 60-65% grade I DD RV normal. Mild TR.   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 Aortic atherosclerosis, three-vessel coronary artery  disease.  CT high-resolution 06/27/2018-stable interstitial lung disease. I reviewed the images personally.  Hi res CT 02/05/19: stable ILD   PFTs  FENO 09/06/2017-unable to complete  07/27/2017 FVC 1.65 (54%), FEV1 1.53 (62%], F/F 93, TLC 50, DLCO 44%, DLCO/VA 131% 10/25/2017 FVC 1.51 [50%], FEV1 1.26 [51%], F/F 83 01/29/2018 FVC 1.69 [5%), FEV1 1.50 [61%], F/F 89, DLCO 10.57 [41%] 01/06/2019 FVC 1.58 [52%], FEV1 1.46 [60%],F/F 92, TLC 2.62 [50%], DLCO 9.16 [42%] Severe restriction and diffusion impairment. FVC is stable but DLCO has worsened.  6-minute walk  10/23/2017- 144 m Post walk heart rate, stats 94, 91%  02/04/2018-249 m Post walk heart rate, sats 101, 99%  06/06/2018-212 m Post walk heart rate, sats 86, 92%  01/30/2019-146mPost walk heart rate, sats 83, 98%      Past Medical History:  Diagnosis Date  . GERD (gastroesophageal reflux disease)   . Hypertension   . Hypothyroidism   . ILD (interstitial lung disease) ([facility] 02/08/2018  . Interstitial lung disease ([facility]   . Multinodular thyroid    benign FNA 08/2017.  .Marland KitchenPerimenopause 02/27/2019  . Scleroderma ([place]   . Vitamin D deficiency disease 02/27/2019   Past Surgical History:  Procedure Laterality Date  . ABDOMINAL HERNIA REPAIR    . BUNIONECTOMY Bilateral 10/2018  . COLONOSCOPY WITH PROPOFOL N/A 01/02/2019   Procedure: COLONOSCOPY WITH PROPOFOL;  Surgeon: RDaneil Dolin  MD;  [facility];  Service: Endoscopy;  Laterality: N/A;  12:30pm  . ESOPHAGOGASTRODUODENOSCOPY (EGD) WITH PROPOFOL N/A 01/28/2018   erosive reflux esophagitis, patulous EG [place], no dilation, incomplete EGD due to retained food in stomach. GES thereafter with delayed gastric emptying.   Marland Kitchen RIGHT HEART CATH N/A 09/27/2017   Procedure: RIGHT HEART CATH;  Surgeon: Jolaine Artist, MD;  Location: [facility];  Service: Cardiovascular;  Laterality: N/A;  . RIGHT HEART CATH N/A 09/05/2019   Procedure: RIGHT HEART  CATH;  Surgeon: Jolaine Artist, MD;  Location: [facility];  Service: Cardiovascular;  Laterality: N/A;  . UTERINE FIBROID SURGERY       Current Outpatient Medications  Medication Sig Dispense Refill  . azelastine (OPTIVAR) 0.05 % ophthalmic solution     . bisoprolol (ZEBETA) 5 MG tablet TAKE ONE TABLET BY MOUTH DAILY. 30 tablet 1  . Cholecalciferol (VITAMIN D-3) 5000 units TABS Take 10,000 Units by mouth daily.     Marland Kitchen esomeprazole (NEXIUM) 40 MG capsule TAKE 1 CAPSULE BY MOUTH TWO TIMES DAILY 180 capsule 3  . furosemide (LASIX) 40 MG tablet Take 40 mg by mouth daily. Except Saturdays and Sundays    . macitentan (OPSUMIT) 10 MG tablet Take 1 tablet (10 mg total) by mouth daily. 30 tablet 11  . Multiple Vitamins-Minerals (MEGA MULTIVITAMIN) POWD Take 1 Scoop by mouth daily. Mix 1 scoop in water    . mycophenolate (CELLCEPT) 500 MG tablet TAKE 3 TABLETS BY MOUTH IN THE MORNING AND 3 TABLETS IN THE EVENING 170 tablet 1  . Nintedanib (OFEV) 150 MG CAPS Take 1 capsule (150 mg total) by mouth 2 (two) times daily. 60 capsule 2  . potassium chloride 20 MEQ/15ML (10%) SOLN Take 20 mEq by mouth daily. Except on saturdays and sundays    . prednisoLONE acetate (PRED FORTE) 1 % ophthalmic suspension INSTILL 1 DROP INTO EACH EYE 4 TIMES DAILY FOR 14 DAYS THEN 1 DROP INTO EACH EYE TWICE DAILY FOR 30 DAYS THEN 1 DROP INTO EACH EYE ONCE DAIL    . spironolactone (ALDACTONE) 25 MG tablet TAKE 1 TABLET BY MOUTH DAILY. 30 tablet 6  . sulfamethoxazole-trimethoprim (BACTRIM DS) 800-160 MG tablet Take 1 tablet by mouth daily. 30 tablet 3  . thyroid (NP THYROID) 60 MG tablet Take 1 tablet (60 mg total) by mouth daily before breakfast. 30 tablet 3   No current facility-administered medications for this encounter.    Allergies:   Lisinopril   Social History:  The patient  reports that she has never smoked. She has never used smokeless tobacco. She reports that she does not drink alcohol and does not use  drugs.   Family History:  The patient's family history includes Diabetes in her maternal grandfather, maternal uncle, and mother; Hypertension in her brother, father, and sister.   ROS:  Please see the history of present illness.   All other systems are personally reviewed and negative.   Vitals:   12/25/19 1258  BP: (!) 140/92  Pulse: 74  SpO2: 99%  Weight: 60.1 kg (132 lb 9.6 oz)  Height: 5' 5" (1.651 m)    Exam:   General:  Well appearing. No resp difficulty HEENT: normal mild skin tightening Neck: supple. no JVD. Carotids 2+ bilat; no bruits. No lymphadenopathy or thryomegaly appreciated. Cor: PMI nondisplaced. Regular rate & rhythm. No rubs, gallops or murmurs. Lungs: decreased throughout Abdomen: soft, nontender, nondistended. No hepatosplenomegaly. No bruits or masses. Good  bowel sounds. Extremities: no cyanosis, clubbing, rash, edema Neuro: alert & orientedx3, cranial nerves grossly intact. moves all 4 extremities w/o difficulty. Affect pleasant   Recent Labs: 01/30/2019: B Natriuretic Peptide 199.4 06/05/2019: TSH 0.72 09/05/2019: Hemoglobin 10.5; Platelets 222 11/20/2019: ALT 13; BUN 18; Creatinine, Ser 0.83; Potassium 4.2; Sodium 137  Personally reviewed   Wt Readings from Last 3 Encounters:  12/25/19 60.1 kg (132 lb 9.6 oz)  11/20/19 59.3 kg (130 lb 12.8 oz)  10/16/19 59.5 kg (131 lb 3.2 oz)      ASSESSMENT AND PLAN:  1. Chronic diastolic HF - Fluid status much improved on lasix 40 M-F + PRN - She wants to cut lasix back a little bit. Ok with me but needs to watch fluid closely - Echo 5/19: EF 60-65% grade I DD RV normal. Mild TR.  - Echo 11/18/18: EF hyperdynamic 65-70% RV normal Grade 1 DD  2. PAH - mild - tolerating macitentan well. Will not add PDE-5 yet  3. HTN - Blood pressure high here but well controlled at home.  4. Scleroderma - Followed by Dr Trudie Reed - continue Cellcept - No change currently  5. Pulmonary fibrosis - High res CT chest c/w  with ILD. Repeat stable 2/21 - Followed by Dr Vaughan Browner - Continue Ofev & Cellcept  Signed, Glori Bickers, MD  12/25/2019 1:48 PM  Advanced Heart Failure [place] [address] [facility] and [place] [address] [phone] (office) [phone] (fax)

## 2019-12-25 NOTE — Patient Instructions (Signed)
Please call our office in January to schedule your follow up appointment  If you have any questions or concerns before your next appointment please send [place] a message through [place] or call our office at [phone].    TO LEAVE A MESSAGE FOR THE NURSE SELECT OPTION 2, PLEASE LEAVE A MESSAGE INCLUDING: . YOUR NAME . DATE OF BIRTH . CALL BACK NUMBER . REASON FOR CALL**this is important as we prioritize the call backs  [place] AS LONG AS YOU CALL BEFORE 4:00 PM  At the [facility], you and your health needs are our priority. As part of our continuing mission to provide you with exceptional heart care, we have created designated Provider Care Teams. These Care Teams include your primary Cardiologist (physician) and Advanced Practice Providers (APPs- Physician Assistants and Nurse Practitioners) who all work together to provide you with the care you need, when you need it.   You may see any of the following providers on your designated Care Team at your next follow up: Marland Kitchen Dr Glori Bickers . Dr Loralie Champagne . Darrick Grinder, NP . Lyda Jester, PA . Audry Riles, PharmD   Please be sure to bring in all your medications bottles to every appointment.

## 2020-01-15 ENCOUNTER — Other Ambulatory Visit: Payer: Self-pay

## 2020-01-15 NOTE — Telephone Encounter (Signed)
This is a CHF pt 

## 2020-01-16 ENCOUNTER — Other Ambulatory Visit: Payer: Self-pay | Admitting: Internal Medicine

## 2020-01-16 MED ORDER — BISOPROLOL FUMARATE 5 MG PO TABS: 5.0000 mg | ORAL_TABLET | Freq: Every day | ORAL | 1 refills | Status: DC

## 2020-01-19 MED FILL — BISOPROLOL FUMARATE 5 MG TA: 5 | 30 days supply | Qty: 30 | Fill #0

## 2020-01-22 ENCOUNTER — Other Ambulatory Visit: Payer: Self-pay

## 2020-01-22 ENCOUNTER — Encounter: Payer: Self-pay | Admitting: Nurse Practitioner

## 2020-01-22 ENCOUNTER — Ambulatory Visit: Payer: 59 | Admitting: Nurse Practitioner

## 2020-01-22 VITALS — BP 146/103 | HR 77 | Temp 97.3°F | Ht 65.0 in | Wt 133.0 lb

## 2020-01-22 DIAGNOSIS — K219 Gastro-esophageal reflux disease without esophagitis: Secondary | ICD-10-CM | POA: Diagnosis not present

## 2020-01-22 DIAGNOSIS — K3184 Gastroparesis: Secondary | ICD-10-CM

## 2020-01-22 DIAGNOSIS — R140 Abdominal distension (gaseous): Secondary | ICD-10-CM | POA: Diagnosis not present

## 2020-01-22 NOTE — Progress Notes (Signed)
Cc'ed to pcp °

## 2020-01-22 NOTE — Patient Instructions (Signed)
Your health issues we discussed today were:   GERD (reflux/heartburn): 1. Continue taking your Nexium as you have been 2. If you have "breakthrough" heartburn symptoms occasionally/rarely, you can use over-the-counter Tums to help 3. If your breakthrough symptoms become more frequent or more severe let us know and we can send another medication to help 4. Call us if you have any worsening or severe symptoms  Gastroparesis: 1. Continue to see [facility] for gastroparesis as you have been  Worsening cough: 1. As we discussed, follow-up with pulmonary about your progressive cough 2. This could be related to your scleroderma, allergies, or other respiratory issues 3. Given that your reflux/GERD is doing so well I do not think it is related to your reflux at this time  Overall I recommend:  1. Continue your other current medications 2. Return for follow-up in 1 year 3. Call us for any questions or concerns   At [facility] we value your feedback. You may receive a survey about your visit today. Please share your experience as we strive to create trusting relationships with our patients to provide genuine, compassionate, quality care.  We appreciate your understanding and patience as we review any laboratory studies, imaging, and other diagnostic tests that are ordered as we care for you. Our office policy is 5 business days for review of these results, and any emergent or urgent results are addressed in a timely manner for your best interest. If you do not hear from our office in 1 week, please contact us.   We also encourage the use of MyChart, which contains your medical information for your review as well. If you are not enrolled in this feature, an access code is on this after visit summary for your convenience. Thank you for allowing [place] to be involved in your care.  It was great to see you today!  I hope you have a great rest of your summer!!

## 2020-01-22 NOTE — Progress Notes (Signed)
Referring Provider: [name], MD Primary Care Physician:  [name], MD Primary GI:  Dr. [name]  Chief Complaint  Patient presents with  . Follow-up    doing ok    HPI:   Karen [name] is a 49 y.o. female who presents for 56-month follow-up.  The patient was last seen in our office 05/22/2019 for GERD and gastroparesis.  Noted chronic history of GERD, gastroparesis, constipation.  Most recent EGD in September 2019 with erosive reflux esophagitis, patulous GE junction, no dilation and limited exam due to retained food in stomach.  CT in April 2020 with questionable mild inflammatory changes of the rectosigmoid junction/rectum representing possible diverticulitis or proctitis; also noted cholelithiasis, enlarged fibroid uterus with local mass-effect on sigmoid colon/rectum and urinary bladder, physiologic cystic changes of adnexa, and redemonstration of left-sided nonobstructive nephrolithiasis.  Empirically treated for diverticulitis with Cipro and Flagyl for 7 days.  She did have resolution of her abdominal pain after treatment.  Colonoscopy up-to-date 01/02/2019 and found normal colon with recommended repeat in 10 years (2030).  At her last visit she noted gastroparesis about the same with early satiety and nausea that eventually clears, Reglan has helped.  Some occasional GERD currently on Nexium twice daily and does not use anything for flares.  Nexium mostly effective.  Persistent cough that seems to be getting worse and swallowing difficulties at times.  She mentioned her cough to pulmonologist in [place] who indicates they will schedule a test to evaluate.  No other overt GI complaints.  Recommended continue Reglan 5 mg twice daily, continue Nexium, viscous lidocaine for breakthrough symptoms, follow-up in 6 months.  It appears patient is started seeing [facility] with an office visit dated 07/15/2019.  They recommended discontinuing Reglan, call us more  probable diet and small frequent meals, referral to nutritionist, continue PPI, consider EGD with FLIP and pyloric Botox but hold for now.  Also discussed potential feeding tube in the future potentially after lung transplant, but not needed now.  Colon cancer screening/surveillance is up-to-date.  Today she states she is doing ok overall. She is taking Nexium twice daily and feels her GERD symptoms are improved. She does still have breakthrough symptoms about 1-2 times a month but doesn't take anything for this. Occasional chronic abdominal pain and fullness. She has implemented recommended diet changes recommended by [facility]. Still with occasional fullness. No longer on Reglan. Denies hematochezia, melena, fever, chills, unintentional weight loss. Does have a progressively worsening cough with a history of scleroderma. Denies URI or flu-like symptoms. Denies loss of sense of taste or smell. Denies chest pain, worsening dyspnea, dizziness, lightheadedness, syncope, near syncope. Denies any other upper or lower GI symptoms.  Past Medical History:  Diagnosis Date  . GERD (gastroesophageal reflux disease)   . Hypertension   . Hypothyroidism   . ILD (interstitial lung disease) ([place]) 02/08/2018  . Interstitial lung disease ([place])   . Multinodular thyroid    benign FNA 08/2017.  Marland Kitchen Perimenopause 02/27/2019  . Scleroderma ([place])   . Vitamin D deficiency disease 02/27/2019    Past Surgical History:  Procedure Laterality Date  . ABDOMINAL HERNIA REPAIR    . BUNIONECTOMY Bilateral 10/2018  . COLONOSCOPY WITH PROPOFOL N/A 01/02/2019   Procedure: COLONOSCOPY WITH PROPOFOL;  Surgeon: Daneil Dolin, MD;  [facility];  Service: Endoscopy;  Laterality: N/A;  12:30pm  . ESOPHAGOGASTRODUODENOSCOPY (EGD) WITH PROPOFOL N/A 01/28/2018   erosive reflux esophagitis, patulous EG Junction, no dilation, incomplete  EGD due to retained food in stomach. GES thereafter with delayed gastric emptying.   Marland Kitchen RIGHT HEART  CATH N/A 09/27/2017   Procedure: RIGHT HEART CATH;  Surgeon: Jolaine Artist, MD;  Location: [facility] [facility];  Service: Cardiovascular;  Laterality: N/A;  . RIGHT HEART CATH N/A 09/05/2019   Procedure: RIGHT HEART CATH;  Surgeon: Jolaine Artist, MD;  Location: [facility];  Service: Cardiovascular;  Laterality: N/A;  . UTERINE FIBROID SURGERY      Current Outpatient Medications  Medication Sig Dispense Refill  . azelastine (OPTIVAR) 0.05 % ophthalmic solution     . bisoprolol (ZEBETA) 5 MG tablet Take 1 tablet (5 mg total) by mouth daily. 30 tablet 1  . Cholecalciferol (VITAMIN D-3) 5000 units TABS Take 10,000 Units by mouth daily.     Marland Kitchen esomeprazole (NEXIUM) 40 MG capsule TAKE 1 CAPSULE BY MOUTH TWO TIMES DAILY 180 capsule 3  . furosemide (LASIX) 40 MG tablet Take 40 mg by mouth as needed. Except Saturdays and Sundays     . macitentan (OPSUMIT) 10 MG tablet Take 1 tablet (10 mg total) by mouth daily. 30 tablet 11  . Multiple Vitamins-Minerals (MEGA MULTIVITAMIN) POWD Take 1 Scoop by mouth daily. Mix 1 scoop in water    . mycophenolate (CELLCEPT) 500 MG tablet TAKE 3 TABLETS BY MOUTH IN THE MORNING AND 3 TABLETS IN THE EVENING 170 tablet 1  . Nintedanib (OFEV) 150 MG CAPS Take 1 capsule (150 mg total) by mouth 2 (two) times daily. 60 capsule 2  . potassium chloride 20 MEQ/15ML (10%) SOLN Take 20 mEq by mouth as needed. Except on saturdays and sundays     . prednisoLONE acetate (PRED FORTE) 1 % ophthalmic suspension as needed.     Marland Kitchen spironolactone (ALDACTONE) 25 MG tablet TAKE 1 TABLET BY MOUTH DAILY. 30 tablet 6  . sulfamethoxazole-trimethoprim (BACTRIM DS) 800-160 MG tablet Take 1 tablet by mouth daily. 30 tablet 3  . thyroid (NP THYROID) 60 MG tablet Take 1 tablet (60 mg total) by mouth daily before breakfast. 30 tablet 3   No current facility-administered medications for this visit.    Allergies as of 01/22/2020 - Review Complete 01/22/2020  Allergen Reaction Noted  .  Lisinopril Hives and Swelling 08/13/2015    Family History  Problem Relation Age of Onset  . Hypertension Father   . Hypertension Sister   . Hypertension Brother   . Diabetes Maternal Grandfather   . Diabetes Maternal Uncle   . Diabetes Mother   . Colon cancer Neg Hx     Social History   Socioeconomic History  . Marital status: Single    Spouse name: Not on file  . Number of children: 0  . Years of education: Not on file  . Highest education level: Associate degree: occupational, Hotel manager, or vocational program  Occupational History  . Occupation: CMA    Employer: [employer]  Tobacco Use  . Smoking status: Never Smoker  . Smokeless tobacco: Never Used  Vaping Use  . Vaping Use: Former  Substance and Sexual Activity  . Alcohol use: No  . Drug use: No  . Sexual activity: Yes  Other Topics Concern  . Not on file  Social History Narrative   Patient is right-handed. She lives alone in one level home, a few steps to enter.CMA [facility].   Social Determinants of Health   Financial Resource Strain:   . Difficulty of Paying Living Expenses: Not on file  Food Insecurity:   . Worried About Charity fundraiser in the Last Year: Not on file  . Ran Out of Food in the Last Year: Not on file  Transportation Needs:   . Lack of Transportation (Medical): Not on file  . Lack of Transportation (Non-Medical): Not on file  Physical Activity:   . Days of Exercise per Week: Not on file  . Minutes of Exercise per Session: Not on file  Stress:   . Feeling of Stress : Not on file  Social Connections:   . Frequency of Communication with Friends and Family: Not on file  . Frequency of Social Gatherings with Friends and Family: Not on file  . Attends Religious Services: Not on file  . Active Member of Clubs or Organizations: Not on file  . Attends Archivist Meetings: Not on file  . Marital Status: Not on file    Subjective: Review of Systems   Constitutional: Negative for chills, fever, malaise/fatigue and weight loss.  HENT: Negative for congestion and sore throat.   Respiratory: Negative for cough and shortness of breath.   Cardiovascular: Negative for chest pain and palpitations.  Gastrointestinal: Positive for heartburn (improved). Negative for abdominal pain, blood in stool, diarrhea, melena, nausea and vomiting.       Abdominal fullness  Musculoskeletal: Negative for joint pain and myalgias.  Skin: Negative for rash.  Neurological: Negative for dizziness and weakness.  Endo/Heme/Allergies: Does not bruise/bleed easily.  Psychiatric/Behavioral: Negative for depression. The patient is not nervous/anxious.   All other systems reviewed and are negative.    Objective: BP (!) 146/103   Pulse 77   Temp (!) 97.3 F (36.3 C) (Temporal)   Ht 5\' 5"  (1.651 m)   Wt 133 lb (60.3 kg)   BMI 22.13 kg/m  Physical Exam Vitals and nursing note reviewed.  Constitutional:      General: She is not in acute distress.    Appearance: Normal appearance. She is well-developed and normal weight. She is not ill-appearing, toxic-appearing or diaphoretic.  HENT:     Head: Normocephalic and atraumatic.     Nose: No congestion or rhinorrhea.  Eyes:     General: No scleral icterus. Cardiovascular:     Rate and Rhythm: Normal rate and regular rhythm.     Heart sounds: Normal heart sounds.  Pulmonary:     Effort: Pulmonary effort is normal. No respiratory distress.     Breath sounds: Normal breath sounds.  Abdominal:     General: Bowel sounds are normal.     Palpations: Abdomen is soft. There is no hepatomegaly, splenomegaly or mass.     Tenderness: There is no abdominal tenderness. There is no guarding or rebound.     Hernia: No hernia is present.  Skin:    General: Skin is warm and dry.     Coloration: Skin is not jaundiced.     Findings: No rash.  Neurological:     General: No focal deficit present.     Mental Status: She is alert  and oriented to person, place, and time.  Psychiatric:        Attention and Perception: Attention normal.        Mood and Affect: Mood normal.        Speech: Speech normal.        Behavior: Behavior normal.        Thought Content: Thought content normal.        Cognition and Memory: Cognition and  memory normal.      Assessment:  49 year old female with a history of scleroderma, gastroparesis, GERD presents for follow-up. Since she was last seen as she has gone to [facility] gastroenterology. They appear to have taken the grains on her gastroparesis. They stopped her Reglan and recommended a low particle diet. She has seen a dietitian with [facility] to discuss dietary changes related to gastroparesis. States she was having some side effects from Reglan (although we were not notified of this previously). She does note some progressively worsening chronic cough over the past couple few months.  GERD: Symptoms seem significantly improved. Having breakthrough 1-2 times a month. Recommended Tums as needed for breakthrough otherwise continue twice daily Nexium. Given the overall improvement in her GERD symptoms I do not feel GERD is likely responsible for her chronic cough, especially with a significant pulmonary history.  Gastroparesis: It appears [employer] has taken over treatment of gastroparesis. At this point we will defer to them. Follow-up with [facility] based on their recommendations.   Plan: 1. Continue current medications 2. Follow-up with [facility] based on their recommendations 3. Contact pulmonary to discuss onset and progressive worsening of cough 4. Follow-up in 1 year    Thank you for allowing [place] to participate in the care of Karen Pu, DNP, AGNP-C Adult & Gerontological Nurse Practitioner [facility]   01/22/2020 11:01 AM   Disclaimer: This note was dictated with voice recognition software. Similar  sounding words can inadvertently be transcribed and may not be corrected upon review.

## 2020-01-26 MED FILL — SPIRONOLACTONE 25 MG TABS: 25 | 30 days supply | Qty: 30 | Fill #1

## 2020-01-26 MED FILL — NP THYROID 90 MG TABLET: 90 | 30 days supply | Qty: 30 | Fill #1

## 2020-01-28 IMAGING — CT CT CHEST HIGH RESOLUTION W/O CM
2 of 6 series · 15 of 36 positions shown, 18 images · non-contrast
Comparison: 07/24/2017 chest CT.

CLINICAL DATA: Reported new diagnosis of scleroderma. Chronic
dyspnea and fatigue.

EXAM:
CT CHEST WITHOUT CONTRAST
TECHNIQUE: Multidetector CT imaging of the chest was performed following the
standard protocol without intravenous contrast. High resolution
imaging of the lungs, as well as inspiratory and expiratory imaging,
was performed.

[Series 2: high resolution · axial · 0.57mm/px · z∈[-263,-51]mm · 12 of 120 slices shown, 15 images]
[im 7/120  mediastinal]
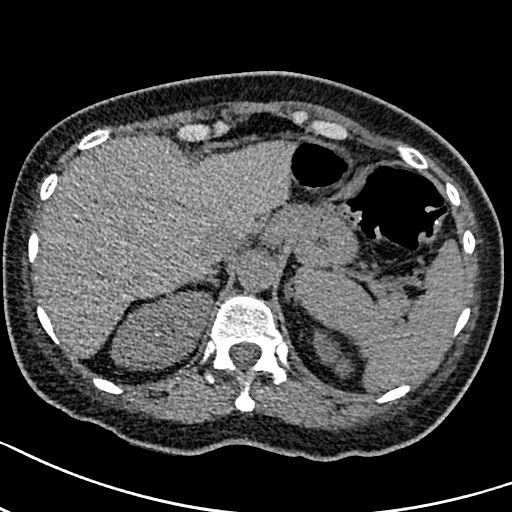
[im 7/120  lung]
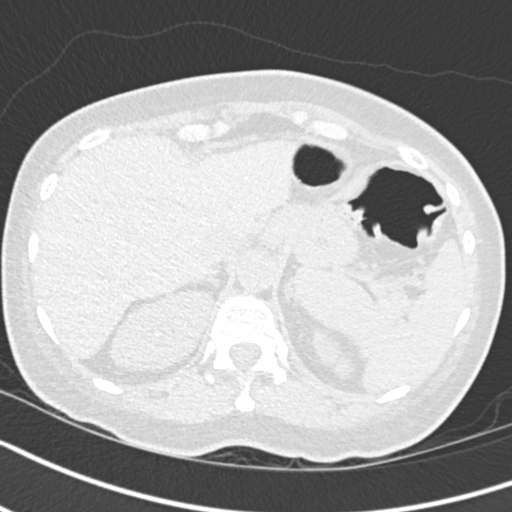
[im 19/120  lung]
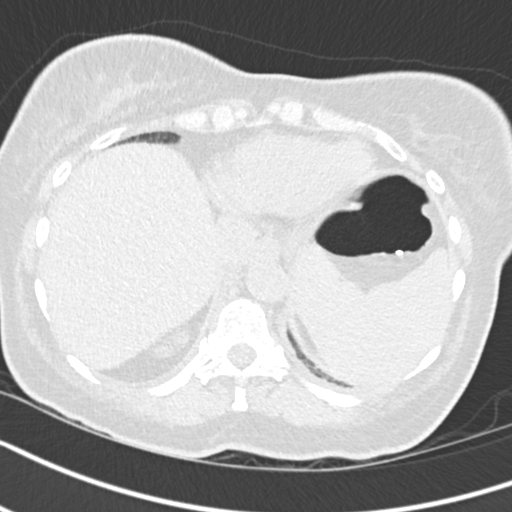
[im 26/120  lung]
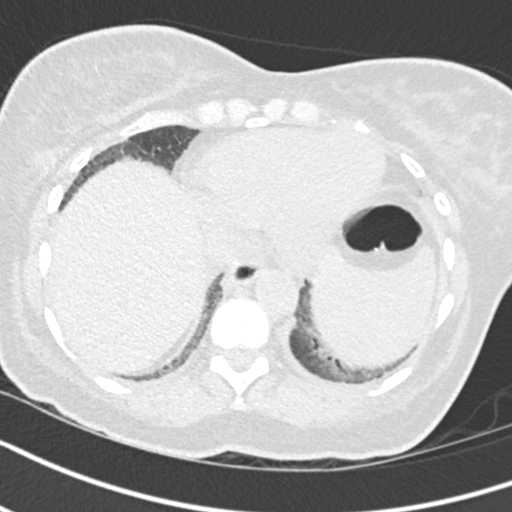
[im 38/120  lung]
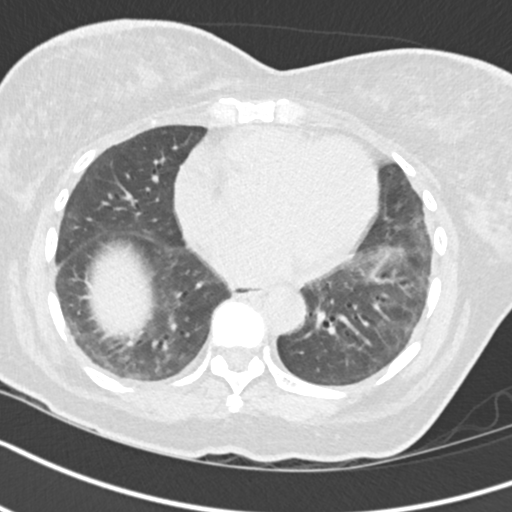
[im 44/120  mediastinal]
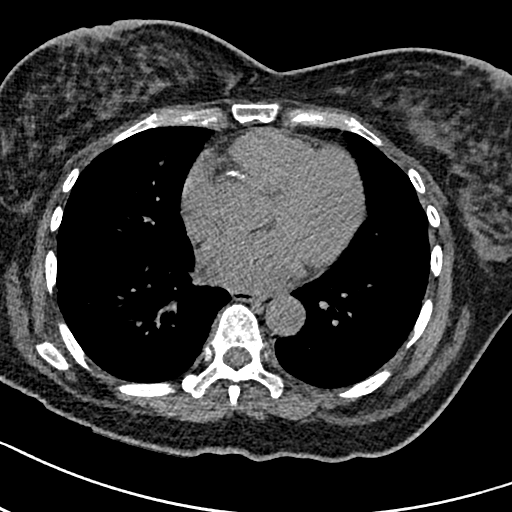
[im 44/120  lung]
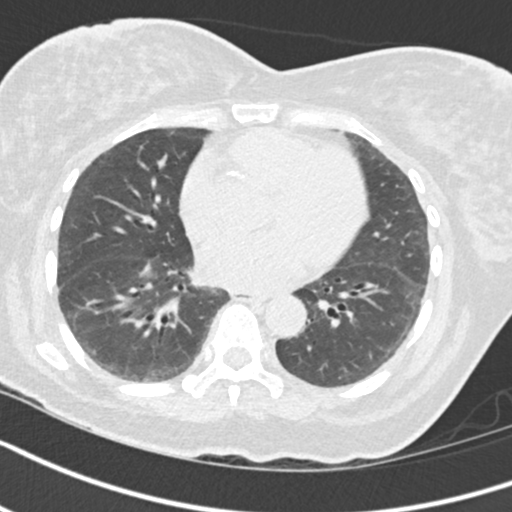
[im 57/120  lung]
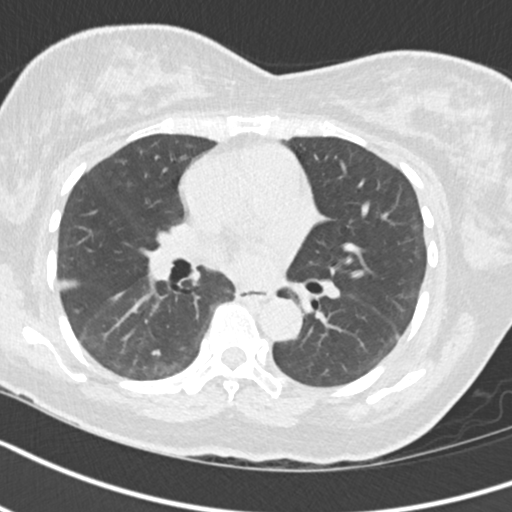
[im 63/120  lung]
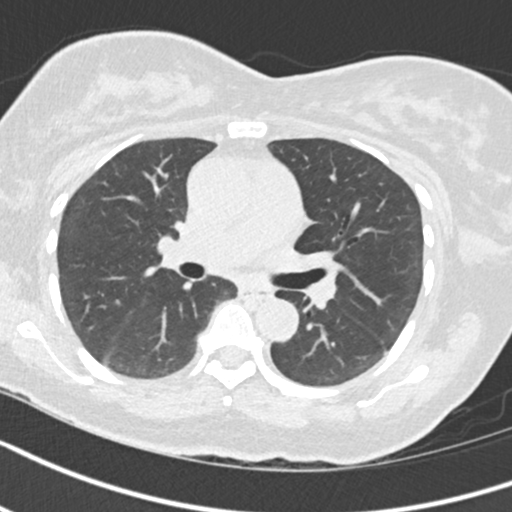
[im 76/120  lung]
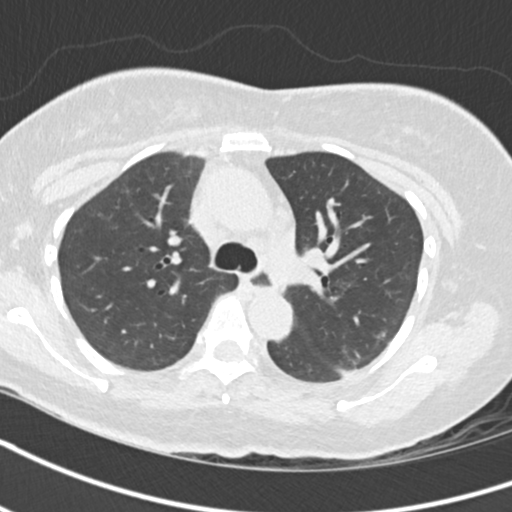
[im 82/120  mediastinal]
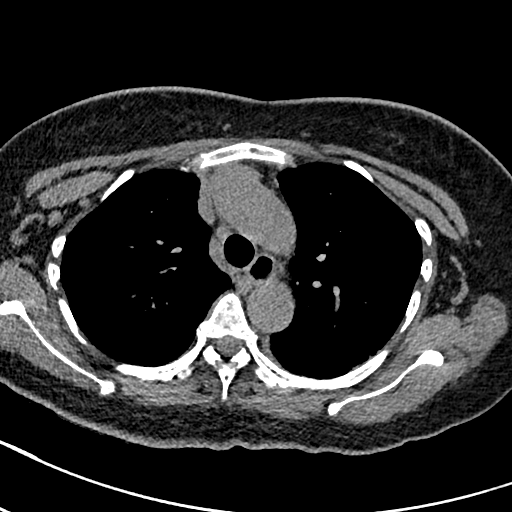
[im 82/120  lung]
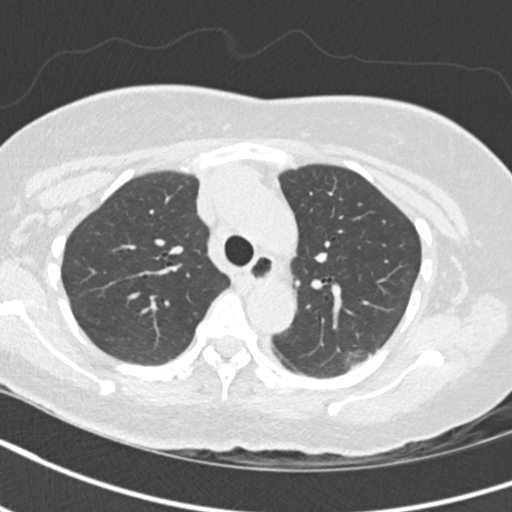
[im 94/120  lung]
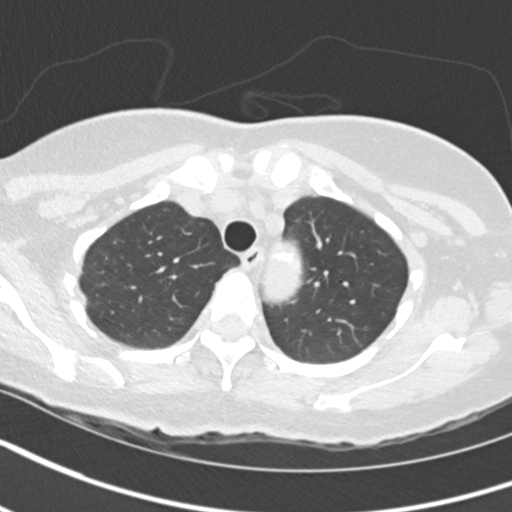
[im 101/120  lung]
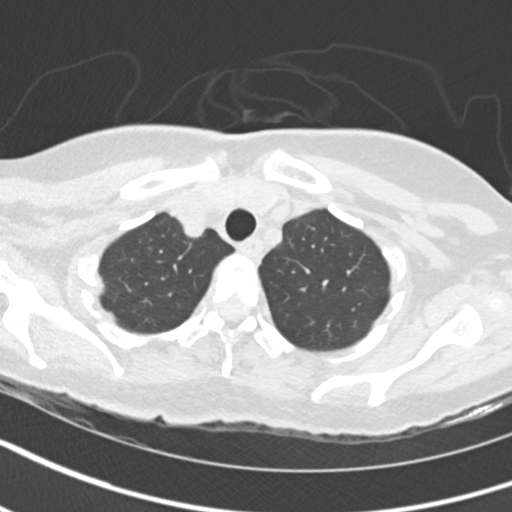
[im 113/120  lung]
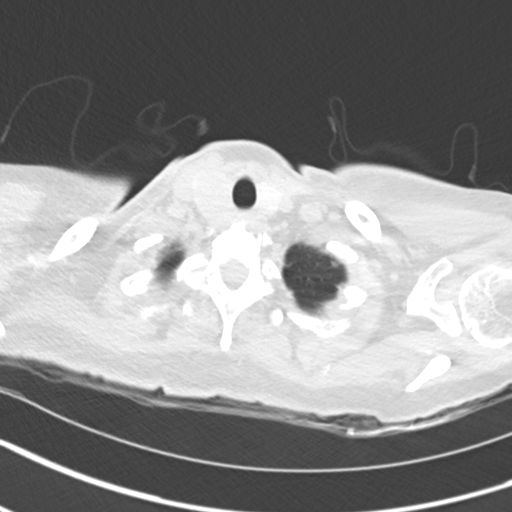

[Series 8: coronal · coronal · 0.49mm/px · 3 of 91 slices shown]
[im 19/91  lung]
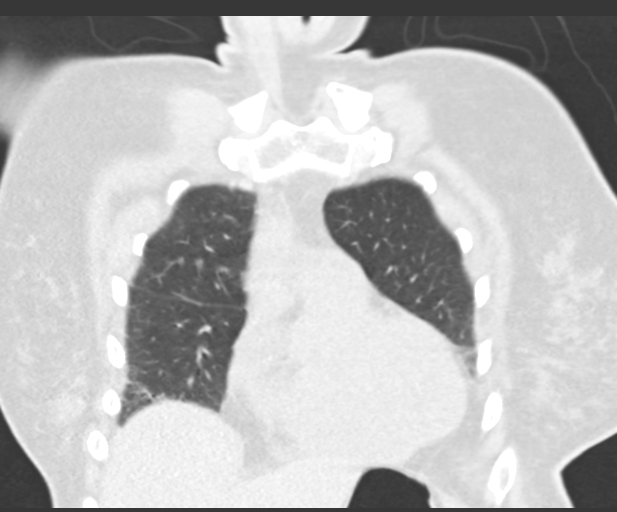
[im 37/91  lung]
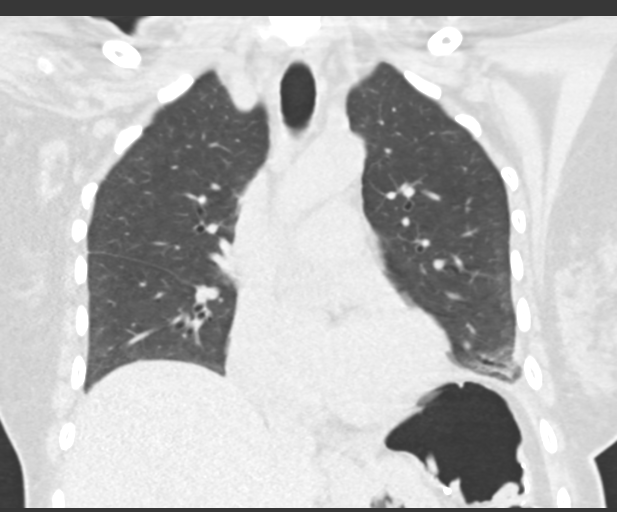
[im 55/91  lung]
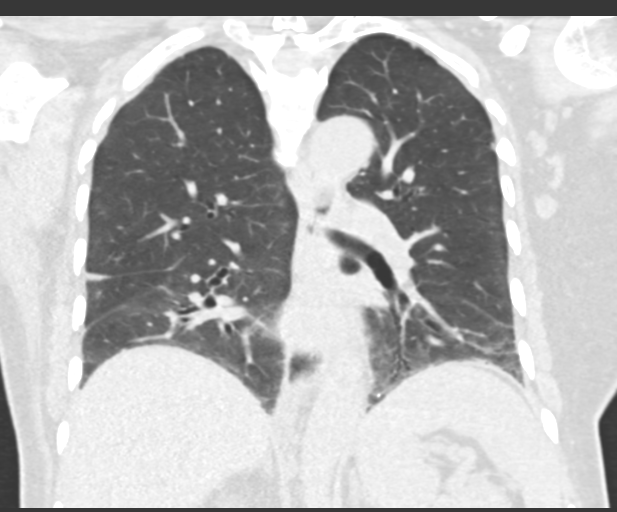

[15 of 36 positions shown; findings below may reference images not displayed]

FINDINGS: Cardiovascular: Normal heart size. No significant pericardial
effusion/thickening. Mildly atherosclerotic nonaneurysmal thoracic
aorta. Top-normal caliber main pulmonary artery (3.0 cm diameter).

Mediastinum/Nodes: Multinodular goiter with dominant partially
calcified hypodense 2.8 cm right thyroid lobe nodule. Mildly
patulous and otherwise normal thoracic esophagus. No pathologically
enlarged axillary, mediastinal or hilar lymph nodes, noting limited
sensitivity for the detection of hilar adenopathy on this
noncontrast study.

Lungs/Pleura: No pneumothorax. Trace dependent left pleural
effusion. No right pleural effusion. No acute consolidative airspace
disease, lung masses or significant pulmonary nodules. There is
patchy confluent subpleural reticulation and ground-glass
attenuation throughout both lungs with a basilar predominance and
associated mild traction bronchiolectasis. No frank honeycombing. No
evidence of progression since 07/24/2017 chest CT. No significant
air trapping on the expiration sequence.

Upper abdomen: Small hiatal hernia.

Musculoskeletal: No aggressive appearing focal osseous lesions.
Moderate thoracic spondylosis.
IMPRESSION: 1. Spectrum of findings compatible with basilar predominant fibrotic
interstitial lung disease with mild traction bronchiolectasis and no
frank honeycombing. Findings are most compatible with nonspecific
interstitial pneumonia (NSIP) due to scleroderma. Follow-up
high-resolution chest CT may be obtained in 12 months to assess
temporal pattern stability, as clinically warranted.
2. Trace dependent left pleural effusion.
3. Mildly patulous thoracic esophagus, typical of scleroderma. Small
hiatal hernia.
4. Multinodular goiter with dominant 2.8 cm right thyroid lobe
nodule. Correlate with recent thyroid ultrasound and thyroid nodule
FNA results.

Aortic Atherosclerosis ([id number]-V40.0).

## 2020-01-29 ENCOUNTER — Encounter (INDEPENDENT_AMBULATORY_CARE_PROVIDER_SITE_OTHER): Payer: Self-pay | Admitting: Internal Medicine

## 2020-01-29 ENCOUNTER — Other Ambulatory Visit: Payer: Self-pay

## 2020-01-29 ENCOUNTER — Ambulatory Visit (INDEPENDENT_AMBULATORY_CARE_PROVIDER_SITE_OTHER): Payer: 59 | Admitting: Internal Medicine

## 2020-01-29 VITALS — BP 120/70 | HR 64 | Temp 97.3°F | Resp 18 | Ht 65.0 in | Wt 134.8 lb

## 2020-01-29 DIAGNOSIS — E039 Hypothyroidism, unspecified: Secondary | ICD-10-CM

## 2020-01-29 MED ORDER — NP THYROID 90 MG PO TABS: 90.0000 mg | ORAL_TABLET | Freq: Every day | ORAL | 3 refills | Status: DC

## 2020-01-29 NOTE — Progress Notes (Signed)
I I will try, see you next week Metrics: Intervention Frequency ACO  Documented Smoking Status Yearly  Screened one or more times in 24 months  Cessation Counseling or  Active cessation medication Past 24 months  Past 24 months   Guideline developer: UpToDate (See UpToDate for funding source) Date Released: 2014       Wellness Office Visit  Subjective:  Patient ID: Karen [name], female    DOB: Jun 16, 1970  Age: 49 y.o. MRN: [id number]  CC: This lady comes in for follow-up of hypothyroidism. HPI  I think there must of been some confusion because when I saw her the last time, I increased her NP thyroid to 90 mg daily but I think the pharmacy did not have any supply of these tablets so she has been taking NP thyroid 60 mg daily.  She still feels cold in her body and lacking energy.  Of course, she has other multiple reasons for reduced energy related to her interstitial lung disease/scleroderma/pulmonary hypertension. Past Medical History:  Diagnosis Date  . GERD (gastroesophageal reflux disease)   . Hypertension   . Hypothyroidism   . ILD (interstitial lung disease) ([place]) 02/08/2018  . Interstitial lung disease ([place])   . Multinodular thyroid    benign FNA 08/2017.  Marland Kitchen Perimenopause 02/27/2019  . Scleroderma ([place])   . Vitamin D deficiency disease 02/27/2019   Past Surgical History:  Procedure Laterality Date  . ABDOMINAL HERNIA REPAIR    . BUNIONECTOMY Bilateral 10/2018  . COLONOSCOPY WITH PROPOFOL N/A 01/02/2019   Procedure: COLONOSCOPY WITH PROPOFOL;  Surgeon: Daneil Dolin, MD;  [facility];  Service: Endoscopy;  Laterality: N/A;  12:30pm  . ESOPHAGOGASTRODUODENOSCOPY (EGD) WITH PROPOFOL N/A 01/28/2018   erosive reflux esophagitis, patulous EG Junction, no dilation, incomplete EGD due to retained food in stomach. GES thereafter with delayed gastric emptying.   Marland Kitchen RIGHT HEART CATH N/A 09/27/2017   Procedure: RIGHT HEART CATH;  Surgeon: Jolaine Artist, MD;   Location: [place] [facility];  Service: Cardiovascular;  Laterality: N/A;  . RIGHT HEART CATH N/A 09/05/2019   Procedure: RIGHT HEART CATH;  Surgeon: Jolaine Artist, MD;  Location: [place] [facility];  Service: Cardiovascular;  Laterality: N/A;  . UTERINE FIBROID SURGERY       Family History  Problem Relation Age of Onset  . Hypertension Father   . Hypertension Sister   . Hypertension Brother   . Diabetes Maternal Grandfather   . Diabetes Maternal Uncle   . Diabetes Mother   . Colon cancer Neg Hx     Social History   Social History Narrative   Patient is right-handed. She lives alone in one level home, a few steps to enter.CMA [facility].   Social History   Tobacco Use  . Smoking status: Never Smoker  . Smokeless tobacco: Never Used  Substance Use Topics  . Alcohol use: No    Current Meds  Medication Sig  . azelastine (OPTIVAR) 0.05 % ophthalmic solution   . bisoprolol (ZEBETA) 5 MG tablet Take 1 tablet (5 mg total) by mouth daily.  . Cholecalciferol (VITAMIN D-3) 5000 units TABS Take 10,000 Units by mouth daily.   Marland Kitchen esomeprazole (NEXIUM) 40 MG capsule TAKE 1 CAPSULE BY MOUTH TWO TIMES DAILY  . furosemide (LASIX) 40 MG tablet Take 40 mg by mouth as needed. Except Saturdays and Sundays   . macitentan (OPSUMIT) 10 MG tablet Take 1 tablet (10 mg total) by mouth daily.  . Multiple Vitamins-Minerals (  MEGA MULTIVITAMIN) POWD Take 1 Scoop by mouth daily. Mix 1 scoop in water  . mycophenolate (CELLCEPT) 500 MG tablet TAKE 3 TABLETS BY MOUTH IN THE MORNING AND 3 TABLETS IN THE EVENING  . Nintedanib (OFEV) 150 MG CAPS Take 1 capsule (150 mg total) by mouth 2 (two) times daily.  . potassium chloride 20 MEQ/15ML (10%) SOLN Take 20 mEq by mouth as needed. Except on saturdays and sundays   . prednisoLONE acetate (PRED FORTE) 1 % ophthalmic suspension as needed.   Marland Kitchen spironolactone (ALDACTONE) 25 MG tablet TAKE 1 TABLET BY MOUTH DAILY.  Marland Kitchen sulfamethoxazole-trimethoprim  (BACTRIM DS) 800-160 MG tablet Take 1 tablet by mouth daily.  Marland Kitchen thyroid (NP THYROID) 60 MG tablet Take 1 tablet (60 mg total) by mouth daily before breakfast.      Depression screen [facility] [place] 2/9 06/19/2019 08/30/2017  Decreased Interest 0 0  Down, Depressed, Hopeless 0 0  PHQ - 2 Score 0 0  Altered sleeping 1 -  Tired, decreased energy 2 -  Change in appetite 0 -  Feeling bad or failure about yourself  0 -  Trouble concentrating 0 -  Moving slowly or fidgety/restless 0 -  Suicidal thoughts 0 -  PHQ-9 Score 3 -  Difficult doing work/chores Somewhat difficult -     Objective:   Today's Vitals: BP 120/70 (BP Location: Left Arm, Patient Position: Sitting, Cuff Size: Normal)   Pulse 64   Temp (!) 97.3 F (36.3 C) (Temporal)   Resp 18   Ht 5\' 5"  (1.651 m)   Wt 134 lb 12.8 oz (61.1 kg)   SpO2 98%   BMI 22.43 kg/m  Vitals with BMI 01/29/2020 01/22/2020 12/25/2019  Height 5\' 5"  5\' 5"  5\' 5"   Weight 134 lbs 13 oz 133 lbs 132 lbs 10 oz  BMI 22.43 23.76 28.31  Systolic 517 616 073  Diastolic 70 710 92  Pulse 64 77 74     Physical Exam  She looks systemically well.  Weight is steady.  Blood pressure is excellent.     Assessment   1. Hypothyroidism, adult       Tests ordered No orders of the defined types were placed in this encounter.    Plan: 1. I have sent a new prescription for NP thyroid 90 mg daily and hopefully she will tolerate this higher dose. 2. I will see her in about 4 to 6 weeks time to see how she is doing and we will probably check levels at that time.   Meds ordered this encounter  Medications  . NP THYROID 90 MG tablet    Sig: Take 1 tablet (90 mg total) by mouth daily.    Dispense:  30 tablet    Refill:  3    Hiliary Osorto Luther Parody, MD

## 2020-02-09 ENCOUNTER — Encounter (HOSPITAL_COMMUNITY): Payer: Self-pay | Admitting: *Deleted

## 2020-02-09 ENCOUNTER — Emergency Department (HOSPITAL_COMMUNITY)
Admission: EM | Admit: 2020-02-09 | Discharge: 2020-02-10 | Disposition: A | Discharge status: Home / Self Care | Payer: 59 | Source: Home / Self Care | Attending: Emergency Medicine | Admitting: Emergency Medicine

## 2020-02-09 ENCOUNTER — Other Ambulatory Visit: Payer: Self-pay

## 2020-02-09 DIAGNOSIS — R1084 Generalized abdominal pain: Secondary | ICD-10-CM

## 2020-02-09 DIAGNOSIS — K5909 Other constipation: Secondary | ICD-10-CM | POA: Diagnosis not present

## 2020-02-09 DIAGNOSIS — J9601 Acute respiratory failure with hypoxia: Secondary | ICD-10-CM | POA: Diagnosis not present

## 2020-02-09 DIAGNOSIS — E039 Hypothyroidism, unspecified: Secondary | ICD-10-CM | POA: Insufficient documentation

## 2020-02-09 DIAGNOSIS — R509 Fever, unspecified: Secondary | ICD-10-CM | POA: Diagnosis not present

## 2020-02-09 DIAGNOSIS — K219 Gastro-esophageal reflux disease without esophagitis: Secondary | ICD-10-CM | POA: Diagnosis not present

## 2020-02-09 DIAGNOSIS — K8000 Calculus of gallbladder with acute cholecystitis without obstruction: Secondary | ICD-10-CM | POA: Diagnosis not present

## 2020-02-09 DIAGNOSIS — Z20822 Contact with and (suspected) exposure to covid-19: Secondary | ICD-10-CM | POA: Diagnosis not present

## 2020-02-09 DIAGNOSIS — I10 Essential (primary) hypertension: Secondary | ICD-10-CM | POA: Insufficient documentation

## 2020-02-09 DIAGNOSIS — R1011 Right upper quadrant pain: Secondary | ICD-10-CM | POA: Diagnosis not present

## 2020-02-09 DIAGNOSIS — Z8249 Family history of ischemic heart disease and other diseases of the circulatory system: Secondary | ICD-10-CM | POA: Diagnosis not present

## 2020-02-09 DIAGNOSIS — R519 Headache, unspecified: Secondary | ICD-10-CM | POA: Insufficient documentation

## 2020-02-09 DIAGNOSIS — K810 Acute cholecystitis: Secondary | ICD-10-CM | POA: Diagnosis not present

## 2020-02-09 DIAGNOSIS — J810 Acute pulmonary edema: Secondary | ICD-10-CM | POA: Diagnosis not present

## 2020-02-09 DIAGNOSIS — K5900 Constipation, unspecified: Secondary | ICD-10-CM

## 2020-02-09 DIAGNOSIS — Z79890 Hormone replacement therapy (postmenopausal): Secondary | ICD-10-CM | POA: Insufficient documentation

## 2020-02-09 DIAGNOSIS — K8020 Calculus of gallbladder without cholecystitis without obstruction: Secondary | ICD-10-CM | POA: Diagnosis not present

## 2020-02-09 DIAGNOSIS — Z888 Allergy status to other drugs, medicaments and biological substances status: Secondary | ICD-10-CM | POA: Diagnosis not present

## 2020-02-09 DIAGNOSIS — R109 Unspecified abdominal pain: Secondary | ICD-10-CM | POA: Diagnosis not present

## 2020-02-09 DIAGNOSIS — Z79899 Other long term (current) drug therapy: Secondary | ICD-10-CM | POA: Insufficient documentation

## 2020-02-09 DIAGNOSIS — R112 Nausea with vomiting, unspecified: Secondary | ICD-10-CM | POA: Diagnosis not present

## 2020-02-09 DIAGNOSIS — M3483 Systemic sclerosis with polyneuropathy: Secondary | ICD-10-CM | POA: Diagnosis not present

## 2020-02-09 DIAGNOSIS — R110 Nausea: Secondary | ICD-10-CM | POA: Insufficient documentation

## 2020-02-09 DIAGNOSIS — R1031 Right lower quadrant pain: Secondary | ICD-10-CM | POA: Diagnosis not present

## 2020-02-09 DIAGNOSIS — Z23 Encounter for immunization: Secondary | ICD-10-CM | POA: Diagnosis not present

## 2020-02-09 LAB — RESPIRATORY PANEL BY RT PCR (FLU A&B, COVID)
Influenza A by PCR: NEGATIVE
Influenza B by PCR: NEGATIVE
SARS Coronavirus 2 by RT PCR: NEGATIVE

## 2020-02-09 MED ORDER — MORPHINE SULFATE (PF) 4 MG/ML IV SOLN
4.0000 mg | Freq: Once | INTRAVENOUS | Status: AC
  Administered 2020-02-09: 4 mg via INTRAVENOUS
  Filled 2020-02-09: qty 1

## 2020-02-09 MED ORDER — ACETAMINOPHEN 325 MG PO TABS
650.0000 mg | ORAL_TABLET | Freq: Once | ORAL | Status: AC
  Administered 2020-02-09: 650 mg via ORAL
  Filled 2020-02-09: qty 2

## 2020-02-09 MED ORDER — ONDANSETRON HCL 4 MG/2ML IJ SOLN
4.0000 mg | Freq: Once | INTRAMUSCULAR | Status: AC
  Administered 2020-02-09: 4 mg via INTRAVENOUS
  Filled 2020-02-09: qty 2

## 2020-02-09 NOTE — ED Triage Notes (Signed)
Pt c/o generalized abdominal pain with nausea x 3 days; pt states she has not had a BM today and yesterday it was not normal; pt c/o headache

## 2020-02-09 NOTE — ED Provider Notes (Signed)
[facility] EMERGENCY DEPARTMENT Provider Note   CSN: [id number] Arrival date & time: 02/09/20  2012     History Chief Complaint  Patient presents with  . Abdominal Pain    Coretha Creswell is a 49 y.o. female.  HPI     This a 49 year old female with a history of hypertension, interstitial lung disease who presents with abdominal pain and constipation.  Patient reports generalized abdominal pain that she describes as dull.  She states that it is all over her abdomen.  She states she has not had a normal bowel movement since Saturday.  She states it hurts when she tries to have a bowel movement and she reports hard stool.  She denies nausea or vomiting.  She has noted some chills but no documented fevers at home.  She currently rates her pain at 8 out of 10.  She denies any chest pain, shortness of breath, cough.  No known sick contacts or Covid exposures.  Denies urinary symptoms.  Of note, patient noted to be febrile to 101.7.  Past Medical History:  Diagnosis Date  . GERD (gastroesophageal reflux disease)   . Hypertension   . Hypothyroidism   . ILD (interstitial lung disease) ([place]) 02/08/2018  . Interstitial lung disease ([place])   . Multinodular thyroid    benign FNA 08/2017.  Marland Kitchen Perimenopause 02/27/2019  . Scleroderma ([place])   . Vitamin D deficiency disease 02/27/2019    Patient Active Problem List   Diagnosis Date Noted  . Bloating 01/22/2020  . Vitamin D deficiency disease 02/27/2019  . Perimenopause 02/27/2019  . Constipation 10/11/2018  . RLQ abdominal pain 09/03/2018  . Gastroparesis 07/03/2018  . Intrinsic atopic dermatitis 06/12/2018  . Neoplasm of uncertain behavior of skin 06/12/2018  . 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 ILD (interstitial lung disease) ([place]) 02/08/2018  . GERD (gastroesophageal reflux disease) 12/18/2017  . Esophageal dysphagia 12/18/2017  . Acne vulgaris 12/05/2017  . Eczema 12/05/2017  . Scleroderma ([place])  10/24/2017  . Multinodular goiter 08/30/2017  . Sjogren's syndrome ([place]) 08/30/2017  . HTN (hypertension) 03/23/2017  . Hypertensive disorder 04/14/2016    Past Surgical History:  Procedure Laterality Date  . ABDOMINAL HERNIA REPAIR    . BUNIONECTOMY Bilateral 10/2018  . COLONOSCOPY WITH PROPOFOL N/A 01/02/2019   Procedure: COLONOSCOPY WITH PROPOFOL;  Surgeon: Daneil Dolin, MD;  [facility];  Service: Endoscopy;  Laterality: N/A;  12:30pm  . ESOPHAGOGASTRODUODENOSCOPY (EGD) WITH PROPOFOL N/A 01/28/2018   erosive reflux esophagitis, patulous EG Junction, no dilation, incomplete EGD due to retained food in stomach. GES thereafter with delayed gastric emptying.   Marland Kitchen RIGHT HEART CATH N/A 09/27/2017   Procedure: RIGHT HEART CATH;  Surgeon: Jolaine Artist, MD;  Location: [place] [facility] LAB;  Service: Cardiovascular;  Laterality: N/A;  . RIGHT HEART CATH N/A 09/05/2019   Procedure: RIGHT HEART CATH;  Surgeon: Jolaine Artist, MD;  Location: [place] [facility] LAB;  Service: Cardiovascular;  Laterality: N/A;  . UTERINE FIBROID SURGERY       OB History   No obstetric history on file.     Family History  Problem Relation Age of Onset  . Hypertension Father   . Hypertension Sister   . Hypertension Brother   . Diabetes Maternal Grandfather   . Diabetes Maternal Uncle   . Diabetes Mother   . Colon cancer Neg Hx     Social History   Tobacco Use  . Smoking status: Never Smoker  .  Smokeless tobacco: Never Used  Vaping Use  . Vaping Use: Former  Substance Use Topics  . Alcohol use: No  . Drug use: No    Home Medications Prior to Admission medications   Medication Sig Start Date End Date Taking? Authorizing Provider  azelastine (OPTIVAR) 0.05 % ophthalmic solution  09/29/19   [provider]  bisoprolol (ZEBETA) 5 MG tablet Take 1 tablet (5 mg total) by mouth daily. 01/16/20   Bensimhon, Shaune Pascal, MD  Cholecalciferol (VITAMIN D-3) 5000 units TABS Take 10,000  Units by mouth daily.     [provider]  esomeprazole (NEXIUM) 40 MG capsule TAKE 1 CAPSULE BY MOUTH TWO TIMES DAILY 08/21/19   Mahala Menghini, PA-C  furosemide (LASIX) 40 MG tablet Take 40 mg by mouth as needed. Except Saturdays and Sundays     [provider]  macitentan (OPSUMIT) 10 MG tablet Take 1 tablet (10 mg total) by mouth daily. 09/11/19   Bensimhon, Shaune Pascal, MD  Multiple Vitamins-Minerals (MEGA MULTIVITAMIN) POWD Take 1 Scoop by mouth daily. Mix 1 scoop in water    [provider]  mycophenolate (CELLCEPT) 500 MG tablet TAKE 3 TABLETS BY MOUTH IN THE MORNING AND 3 TABLETS IN THE EVENING 11/20/19   Mannam, Praveen, MD  Nintedanib (OFEV) 150 MG CAPS Take 1 capsule (150 mg total) by mouth 2 (two) times daily. 11/20/19   Marshell Garfinkel, MD  NP THYROID 90 MG tablet Take 1 tablet (90 mg total) by mouth daily. 01/29/20   Hurshel Party C, MD  potassium chloride 20 MEQ/15ML (10%) SOLN Take 20 mEq by mouth as needed. Except on saturdays and sundays     [provider]  prednisoLONE acetate (PRED FORTE) 1 % ophthalmic suspension as needed.  09/18/19   [provider]  spironolactone (ALDACTONE) 25 MG tablet TAKE 1 TABLET BY MOUTH DAILY. 11/20/19   Bensimhon, Shaune Pascal, MD  sulfamethoxazole-trimethoprim (BACTRIM DS) 800-160 MG tablet Take 1 tablet by mouth daily. 11/20/19   Marshell Garfinkel, MD  thyroid (NP THYROID) 60 MG tablet Take 1 tablet (60 mg total) by mouth daily before breakfast. 09/15/19   [name], MD    Allergies    Lisinopril  Review of Systems   Review of Systems  Constitutional: Positive for chills. Negative for fever.  Respiratory: Negative for shortness of breath.   Cardiovascular: Negative for chest pain.  Gastrointestinal: Positive for abdominal distention and abdominal pain. Negative for diarrhea, nausea and vomiting.  Genitourinary: Negative for dysuria.  All other systems reviewed and are negative.   Physical  Exam Updated Vital Signs BP 116/88 (BP Location: Right Arm)   Pulse 96   Temp (!) 101.7 F (38.7 C) (Oral)   Resp 17   Ht 1.651 m (5\' 5" )   Wt 59 kg   SpO2 96%   BMI 21.63 kg/m   Physical Exam Vitals and nursing note reviewed.  Constitutional:      Appearance: She is well-developed. She is not ill-appearing or toxic-appearing.  HENT:     Head: Normocephalic and atraumatic.     Mouth/Throat:     Mouth: Mucous membranes are moist.  Eyes:     Pupils: Pupils are equal, round, and reactive to light.  Cardiovascular:     Rate and Rhythm: Regular rhythm. Tachycardia present.     Heart sounds: Normal heart sounds.  Pulmonary:     Effort: Pulmonary effort is normal. No respiratory distress.     Breath sounds: No wheezing.  Abdominal:     General: Bowel sounds are normal. There is distension.     Palpations: Abdomen is soft.     Tenderness: There is no abdominal tenderness. There is no guarding or rebound.  Musculoskeletal:     Cervical back: Neck supple.  Skin:    General: Skin is warm and dry.  Neurological:     Mental Status: She is alert and oriented to person, place, and time.  Psychiatric:        Mood and Affect: Mood normal.     ED Results / Procedures / Treatments   Labs (all labs ordered are listed, but only abnormal results are displayed) Labs Reviewed  CBC WITH DIFFERENTIAL/PLATELET - Abnormal; Notable for the following components:      Result Value   WBC 11.6 (*)    Neutro Abs 9.2 (*)    Monocytes Absolute 1.2 (*)    All other components within normal limits  COMPREHENSIVE METABOLIC PANEL - Abnormal; Notable for the following components:   Sodium 134 (*)    Potassium 3.2 (*)    Glucose, Bld 107 (*)    Calcium 10.4 (*)    All other components within normal limits  URINALYSIS, ROUTINE W REFLEX MICROSCOPIC - Abnormal; Notable for the following components:   APPearance HAZY (*)    Hgb urine dipstick SMALL (*)    Protein, ur 100 (*)    All other components  within normal limits  RESPIRATORY PANEL BY RT PCR (FLU A&B, COVID)  RESPIRATORY PANEL BY PCR  WET PREP, GENITAL  LIPASE, BLOOD  GC/CHLAMYDIA PROBE AMP ([place]) NOT AT [facility]    EKG None  Radiology CT ABDOMEN PELVIS W CONTRAST  Result Date: 02/10/2020 CLINICAL DATA:  Generalized abdominal pain. EXAM: CT ABDOMEN AND PELVIS WITH CONTRAST TECHNIQUE: Multidetector CT imaging of the abdomen and pelvis was performed using the standard protocol following bolus administration of intravenous contrast. CONTRAST:  39mL OMNIPAQUE IOHEXOL 300 MG/ML  SOLN COMPARISON:  09/05/2018 FINDINGS: Lower chest: There is atelectasis at the lung bases.The heart is enlarged. Hepatobiliary: The liver is normal. The gallbladder is distended. There appears to be some mild gallbladder wall thickening and possible pericholecystic free fluid.There is no biliary ductal dilation. Pancreas: Normal contours without ductal dilatation. No peripancreatic fluid collection. Spleen: Unremarkable. Adrenals/Urinary Tract: --Adrenal glands: Unremarkable. --Right kidney/ureter: There is a punctate nonobstructing stone in lower pole the right kidney. --Left kidney/ureter: There is a nonobstructing stone in the upper pole the left kidney. --Urinary bladder: The urinary bladder is distended. Stomach/Bowel: --Stomach/Duodenum: No hiatal hernia or other gastric abnormality. Normal duodenal course and caliber. --Small bowel: Unremarkable. --Colon: There is a large amount of stool in the right hemicolon. --Appendix: Normal. Vascular/Lymphatic: Normal course and caliber of the major abdominal vessels. --there are mildly enlarged retroperitoneal lymph nodes. --No mesenteric lymphadenopathy. --No pelvic or inguinal lymphadenopathy. Reproductive: The uterus is enlarged and heterogeneous with multiple fibroids. The largest fibroid measures approximately 8.1 cm. These have increased in size since the prior study. There is infiltration of the pelvic fat with a  small amount of pelvic free fluid. Other: No ascites or free air. The abdominal wall is normal. Musculoskeletal. No acute displaced fractures. IMPRESSION: 1. The gallbladder is distended with some mild gallbladder wall thickening and possible pericholecystic free fluid. Multiple gallstones are noted. If there is concern for acute cholecystitis, recommend further evaluation with right upper quadrant ultrasound. 2. Large amount of stool in the right hemicolon. 3. Fibroid uterus, increased in size since  the prior study. There is infiltration of the pelvic fat with a small amount of pelvic free fluid. This is of unknown clinical significance. Consider pelvic inflammatory disease in the appropriate clinical setting. 4. Cardiomegaly. 5. Bilateral nonobstructive nephrolithiasis. 6. Distended urinary bladder. Electronically Signed   By: Constance Holster M.D.   On: 02/10/2020 01:57    Procedures Procedures (including critical care time)  Medications Ordered in ED Medications  acetaminophen (TYLENOL) tablet 650 mg (650 mg Oral Given 02/09/20 2201)  morphine 4 MG/ML injection 4 mg (4 mg Intravenous Given 02/09/20 2349)  ondansetron (ZOFRAN) injection 4 mg (4 mg Intravenous Given 02/09/20 2348)  iohexol (OMNIPAQUE) 300 MG/ML solution 75 mL (75 mLs Intravenous Contrast Given 02/10/20 0132)    ED Course  I have reviewed the triage vital signs and the nursing notes.  Pertinent labs & imaging results that were available during my care of the patient were reviewed by me and considered in my medical decision making (see chart for details).  Clinical Course as of Feb 09 299  Tue Feb 10, 2020  0256 On recheck, patient reports she feels somewhat better.  I reviewed her CT scan.  Questionable gallbladder thickening and pericholecystic fluid.  Patient states that she has occasional early satiety with some discomfort but does not consistent with her current symptoms.  Additionally, I discussed with her concerns for  STDs.  Patient reports that she is not currently sexually active and has no concerns for STDs.  She knows she has a fibroid uterus and has had fibroids previously removed.  She does not wish to pursue STD testing/pelvic exam.   [CH]    Clinical Course User Index [CH] Renate Danh, Barbette Hair, MD   MDM Rules/Calculators/A&P                          Patient presents with abdominal pain.  She also reports constipation.  She is notably febrile upon arrival.  She reported chills but no documented fevers.  She reports diffuse discomfort and difficulty having a bowel movement.  She denies any upper respiratory symptoms, urinary symptoms.  She has some distention without obvious tenderness and normal bowel sounds.  Unclear whether the abdominal symptoms and fever are related.  Broad work-up initiated.  Basic lab work obtained.  She has mild leukocytosis at 11.6.  Mild hypokalemia without significant other metabolic derangements.  LFTs are normal.  Urinalysis without obvious UTI.  Covid and flu testing are negative.  Given fever, CT scan was obtained to evaluate for diverticular disease or other intra-abdominal infectious etiology.  CT scan is negative for diverticulitis.  There is some gallbladder thickening with possible fluids and gallstones.  Patient's clinical history is not consistent with this.  However, will order right upper quadrant ultrasound for later today for to further evaluate.  Additionally she has some enlargement of her uterus when compared to prior.  Patient denies any risk factors for PID and does not wish to pursue pelvic exam.  She does have a increased stool burden.  Recommend supportive measures at home.  No indication at this time for antibiotics.  Will increase bowel regimen with MiraLAX daily and have her follow-up for any new or worsening symptoms.  After history, exam, and medical workup I feel the patient has been appropriately medically screened and is safe for discharge home. Pertinent  diagnoses were discussed with the patient. Patient was given return precautions.  Darnita Woodrum was evaluated in Emergency Department on  02/10/2020 for the symptoms described in the history of present illness. She was evaluated in the context of the global COVID-19 pandemic, which necessitated consideration that the patient might be at risk for infection with the SARS-CoV-2 virus that causes COVID-19. Institutional protocols and algorithms that pertain to the evaluation of patients at risk for COVID-19 are in a state of rapid change based on information released by regulatory bodies including the CDC and federal and state organizations. These policies and algorithms were followed during the patient's care in the ED.   Final Clinical Impression(s) / ED Diagnoses Final diagnoses:  Generalized abdominal pain  Fever, unspecified fever cause  Constipation, unspecified constipation type    Rx / DC Orders ED Discharge Orders         Ordered    US Abdomen Limited RUQ/Gall Gladder        02/10/20 0259           Merryl Hacker, MD 02/10/20 [id number]

## 2020-02-10 ENCOUNTER — Emergency Department (HOSPITAL_COMMUNITY): Payer: 59

## 2020-02-10 LAB — COMPREHENSIVE METABOLIC PANEL
ALT: 11 U/L (ref 0–44)
AST: 18 U/L (ref 15–41)
Albumin: 4.1 g/dL (ref 3.5–5.0)
Alkaline Phosphatase: 66 U/L (ref 38–126)
Anion gap: 11 (ref 5–15)
BUN: 9 mg/dL (ref 6–20)
CO2: 22 mmol/L (ref 22–32)
Calcium: 10.4 mg/dL — ABNORMAL HIGH (ref 8.9–10.3)
Chloride: 101 mmol/L (ref 98–111)
Creatinine, Ser: 0.53 mg/dL (ref 0.44–1.00)
GFR calc Af Amer: >60 mL/min (ref >60)
GFR calc non Af Amer: >60 mL/min (ref >60)
Glucose, Bld: 107 mg/dL — ABNORMAL HIGH (ref 70–99)
Potassium: 3.2 mmol/L — ABNORMAL LOW (ref 3.5–5.1)
Sodium: 134 mmol/L — ABNORMAL LOW (ref 135–145)
Total Bilirubin: 1.1 mg/dL (ref 0.3–1.2)
Total Protein: 8.1 g/dL (ref 6.5–8.1)

## 2020-02-10 LAB — CBC WITH DIFFERENTIAL/PLATELET
Abs Immature Granulocytes: 0.04 10*3/uL (ref 0.00–0.07)
Basophils Absolute: 0 10*3/uL (ref 0.0–0.1)
Basophils Relative: 0 %
Eosinophils Absolute: 0 10*3/uL (ref 0.0–0.5)
Eosinophils Relative: 0 %
HCT: 39.7 % (ref 36.0–46.0)
Hemoglobin: 12.3 g/dL (ref 12.0–15.0)
Immature Granulocytes: 0 %
Lymphocytes Relative: 11 %
Lymphs Abs: 1.3 10*3/uL (ref 0.7–4.0)
MCH: 27.2 pg (ref 26.0–34.0)
MCHC: 31 g/dL (ref 30.0–36.0)
MCV: 87.8 fL (ref 80.0–100.0)
Monocytes Absolute: 1.2 10*3/uL — ABNORMAL HIGH (ref 0.1–1.0)
Monocytes Relative: 10 %
Neutro Abs: 9.2 10*3/uL — ABNORMAL HIGH (ref 1.7–7.7)
Neutrophils Relative %: 79 %
Platelets: 220 10*3/uL (ref 150–400)
RBC: 4.52 MIL/uL (ref 3.87–5.11)
RDW: 13.8 % (ref 11.5–15.5)
WBC: 11.6 10*3/uL — ABNORMAL HIGH (ref 4.0–10.5)
nRBC: 0 % (ref 0.0–0.2)

## 2020-02-10 LAB — URINALYSIS, ROUTINE W REFLEX MICROSCOPIC
Bacteria, UA: NONE SEEN
Bilirubin Urine: NEGATIVE
Glucose, UA: NEGATIVE mg/dL
Ketones, ur: NEGATIVE mg/dL
Leukocytes,Ua: NEGATIVE
Nitrite: NEGATIVE
Protein, ur: 100 mg/dL — AB
Specific Gravity, Urine: 1.014 (ref 1.005–1.030)
pH: 6 (ref 5.0–8.0)

## 2020-02-10 LAB — LIPASE, BLOOD: Lipase: 21 U/L (ref 11–51)

## 2020-02-10 MED ORDER — IOHEXOL 300 MG/ML  SOLN
75.0000 mL | Freq: Once | INTRAMUSCULAR | Status: AC | PRN
  Administered 2020-02-10: 75 mL via INTRAVENOUS

## 2020-02-10 NOTE — ED Notes (Signed)
Pt wheeled to waiting room. Pt verbalized understanding of discharge instructions.   

## 2020-02-10 NOTE — Discharge Instructions (Signed)
You were seen today for abdominal pain and constipation.  You were found to have a fever.  The source of the fever is unknown at this time.  Covid and flu testing are negative.  You do not appear to have a urinary tract infection.  Abdominal CT does show some gallbladder wall thickening although your symptoms are not consistent with gallbladder pathology.  Will order an ultrasound for later today.  Regarding her constipation, start 1 capful of MiraLAX up to 3 times daily to help aid with bowel movement.

## 2020-02-10 NOTE — ED Notes (Signed)
Water given for po challenge.

## 2020-02-11 ENCOUNTER — Other Ambulatory Visit: Payer: Self-pay

## 2020-02-11 ENCOUNTER — Telehealth (INDEPENDENT_AMBULATORY_CARE_PROVIDER_SITE_OTHER): Payer: 59 | Admitting: Internal Medicine

## 2020-02-11 ENCOUNTER — Encounter (INDEPENDENT_AMBULATORY_CARE_PROVIDER_SITE_OTHER): Payer: Self-pay | Admitting: Internal Medicine

## 2020-02-11 ENCOUNTER — Telehealth (INDEPENDENT_AMBULATORY_CARE_PROVIDER_SITE_OTHER): Payer: Self-pay

## 2020-02-11 DIAGNOSIS — R1011 Right upper quadrant pain: Secondary | ICD-10-CM

## 2020-02-11 MED ORDER — ONDANSETRON HCL 4 MG PO TABS: 4.0000 mg | ORAL_TABLET | Freq: Three times a day (TID) | ORAL | 0 refills | Status: DC | PRN

## 2020-02-11 MED ORDER — OXYCODONE HCL 5 MG PO TABS
5.0000 mg | ORAL_TABLET | ORAL | 0 refills | Status: DC | PRN
Start: 2020-02-11 — End: 2020-02-15

## 2020-02-11 NOTE — Progress Notes (Signed)
Metrics: Intervention Frequency ACO  Documented Smoking Status Yearly  Screened one or more times in 24 months  Cessation Counseling or  Active cessation medication Past 24 months  Past 24 months   Guideline developer: UpToDate (See UpToDate for funding source) Date Released: 2014       Wellness Office Visit  Subjective:  Patient ID: Karen [name], female    DOB: 01/22/1971  Age: 49 y.o. MRN: [id number]  CC: This is an audio telemedicine visit with the permission of the patient who is at home and I am in my office.  I used 2 identifiers to identify the patient. Abdominal pain.  HPI The patient went to the emergency room 2 days ago with abdominal pain.  Evaluation consisted of CT scan of the abdomen which showed distended gallbladder with gallstones.  She is now due to have an ultrasound of the gallbladder tomorrow.  In the meantime, she is getting right-sided abdominal pain which is fairly constant.  She has been nauseous but this seems to have somewhat resolved when she was given medicine in the emergency room.  She is mostly concerned about the pain that she constantly has at the present time.  Past Medical History:  Diagnosis Date  . GERD (gastroesophageal reflux disease)   . Hypertension   . Hypothyroidism   . ILD (interstitial lung disease) ([place]) 02/08/2018  . Interstitial lung disease ([place])   . Multinodular thyroid    benign FNA 08/2017.  Marland Kitchen Perimenopause 02/27/2019  . Scleroderma ([place])   . Vitamin D deficiency disease 02/27/2019   Past Surgical History:  Procedure Laterality Date  . ABDOMINAL HERNIA REPAIR    . BUNIONECTOMY Bilateral 10/2018  . COLONOSCOPY WITH PROPOFOL N/A 01/02/2019   Procedure: COLONOSCOPY WITH PROPOFOL;  Surgeon: Daneil Dolin, MD;  [facility];  Service: Endoscopy;  Laterality: N/A;  12:30pm  . ESOPHAGOGASTRODUODENOSCOPY (EGD) WITH PROPOFOL N/A 01/28/2018   erosive reflux esophagitis, patulous EG Junction, no dilation, incomplete EGD due  to retained food in stomach. GES thereafter with delayed gastric emptying.   Marland Kitchen RIGHT HEART CATH N/A 09/27/2017   Procedure: RIGHT HEART CATH;  Surgeon: Jolaine Artist, MD;  Location: [place] [facility] LAB;  Service: Cardiovascular;  Laterality: N/A;  . RIGHT HEART CATH N/A 09/05/2019   Procedure: RIGHT HEART CATH;  Surgeon: Jolaine Artist, MD;  Location: [place] [facility] LAB;  Service: Cardiovascular;  Laterality: N/A;  . UTERINE FIBROID SURGERY       Family History  Problem Relation Age of Onset  . Hypertension Father   . Hypertension Sister   . Hypertension Brother   . Diabetes Maternal Grandfather   . Diabetes Maternal Uncle   . Diabetes Mother   . Colon cancer Neg Hx     Social History   Social History Narrative   Patient is right-handed. She lives alone in one level home, a few steps to enter.CMA [facility].   Social History   Tobacco Use  . Smoking status: Never Smoker  . Smokeless tobacco: Never Used  Substance Use Topics  . Alcohol use: No    Current Meds  Medication Sig  . azelastine (OPTIVAR) 0.05 % ophthalmic solution   . bisoprolol (ZEBETA) 5 MG tablet Take 1 tablet (5 mg total) by mouth daily.  . Cholecalciferol (VITAMIN D-3) 5000 units TABS Take 10,000 Units by mouth daily.   Marland Kitchen esomeprazole (NEXIUM) 40 MG capsule TAKE 1 CAPSULE BY MOUTH TWO TIMES DAILY  . furosemide (LASIX) 40 MG  tablet Take 40 mg by mouth as needed. Except Saturdays and Sundays   . macitentan (OPSUMIT) 10 MG tablet Take 1 tablet (10 mg total) by mouth daily.  . Multiple Vitamins-Minerals (MEGA MULTIVITAMIN) POWD Take 1 Scoop by mouth daily. Mix 1 scoop in water  . mycophenolate (CELLCEPT) 500 MG tablet TAKE 3 TABLETS BY MOUTH IN THE MORNING AND 3 TABLETS IN THE EVENING  . Nintedanib (OFEV) 150 MG CAPS Take 1 capsule (150 mg total) by mouth 2 (two) times daily.  . NP THYROID 90 MG tablet Take 1 tablet (90 mg total) by mouth daily.  . potassium chloride 20 MEQ/15ML (10%) SOLN  Take 20 mEq by mouth as needed. Except on saturdays and sundays   . prednisoLONE acetate (PRED FORTE) 1 % ophthalmic suspension as needed.   Marland Kitchen spironolactone (ALDACTONE) 25 MG tablet TAKE 1 TABLET BY MOUTH DAILY.  Marland Kitchen sulfamethoxazole-trimethoprim (BACTRIM DS) 800-160 MG tablet Take 1 tablet by mouth daily.  Marland Kitchen thyroid (NP THYROID) 60 MG tablet Take 1 tablet (60 mg total) by mouth daily before breakfast.      Depression screen [facility] 2/9 06/19/2019 08/30/2017  Decreased Interest 0 0  Down, Depressed, Hopeless 0 0  PHQ - 2 Score 0 0  Altered sleeping 1 -  Tired, decreased energy 2 -  Change in appetite 0 -  Feeling bad or failure about yourself  0 -  Trouble concentrating 0 -  Moving slowly or fidgety/restless 0 -  Suicidal thoughts 0 -  PHQ-9 Score 3 -  Difficult doing work/chores Somewhat difficult -     Objective:   Today's Vitals: There were no vitals taken for this visit. Vitals with BMI 02/11/2020 02/10/2020 02/09/2020  Height (No Data) - -  Weight (No Data) - -  BMI - - -  Systolic (No Data) 119 147  Diastolic (No Data) 88 91  Pulse - 96 97     Physical Exam  She appears alert and orientated on the phone.     Assessment   1. Right upper quadrant abdominal pain       Tests ordered No orders of the defined types were placed in this encounter.    Plan: 1. She will undergo ultrasound of the gallbladder tomorrow but in the meantime I will prescribe her a limited prescription of oxycodone and also I will prescribe ondansetron for possible symptomatic relief of nausea and vomiting. 2. This phone call lasted 6 minutes and 58 seconds.   Meds ordered this encounter  Medications  . oxyCODONE (OXY IR/ROXICODONE) 5 MG immediate release tablet    Sig: Take 1 tablet (5 mg total) by mouth every 4 (four) hours as needed for severe pain.    Dispense:  15 tablet    Refill:  0  . ondansetron (ZOFRAN) 4 MG tablet    Sig: Take 1 tablet (4 mg total) by mouth every 8 (eight)  hours as needed for nausea or vomiting.    Dispense:  20 tablet    Refill:  0    Mykaela Arena Luther Parody, MD

## 2020-02-12 ENCOUNTER — Other Ambulatory Visit: Payer: Self-pay

## 2020-02-12 ENCOUNTER — Encounter (HOSPITAL_COMMUNITY): Payer: Self-pay | Admitting: Emergency Medicine

## 2020-02-12 ENCOUNTER — Ambulatory Visit (HOSPITAL_COMMUNITY): Admission: RE | Admit: 2020-02-12 | Payer: 59 | Source: Ambulatory Visit

## 2020-02-12 ENCOUNTER — Inpatient Hospital Stay (HOSPITAL_COMMUNITY)
Admission: EM | Admit: 2020-02-12 | Discharge: 2020-02-15 | DRG: 417 | Disposition: A | Discharge status: Home / Self Care | Payer: 59 | Attending: Family Medicine | Admitting: Family Medicine

## 2020-02-12 ENCOUNTER — Emergency Department (HOSPITAL_COMMUNITY): Payer: 59

## 2020-02-12 DIAGNOSIS — M3500 Sicca syndrome, unspecified: Secondary | ICD-10-CM | POA: Diagnosis present

## 2020-02-12 DIAGNOSIS — Z8249 Family history of ischemic heart disease and other diseases of the circulatory system: Secondary | ICD-10-CM | POA: Diagnosis not present

## 2020-02-12 DIAGNOSIS — J810 Acute pulmonary edema: Secondary | ICD-10-CM | POA: Diagnosis not present

## 2020-02-12 DIAGNOSIS — K8010 Calculus of gallbladder with chronic cholecystitis without obstruction: Secondary | ICD-10-CM | POA: Diagnosis not present

## 2020-02-12 DIAGNOSIS — I10 Essential (primary) hypertension: Secondary | ICD-10-CM | POA: Diagnosis not present

## 2020-02-12 DIAGNOSIS — M349 Systemic sclerosis, unspecified: Secondary | ICD-10-CM | POA: Diagnosis present

## 2020-02-12 DIAGNOSIS — Z23 Encounter for immunization: Secondary | ICD-10-CM

## 2020-02-12 DIAGNOSIS — J9602 Acute respiratory failure with hypercapnia: Secondary | ICD-10-CM | POA: Diagnosis not present

## 2020-02-12 DIAGNOSIS — Z9889 Other specified postprocedural states: Secondary | ICD-10-CM

## 2020-02-12 DIAGNOSIS — E039 Hypothyroidism, unspecified: Secondary | ICD-10-CM | POA: Diagnosis present

## 2020-02-12 DIAGNOSIS — J811 Chronic pulmonary edema: Secondary | ICD-10-CM

## 2020-02-12 DIAGNOSIS — Z9049 Acquired absence of other specified parts of digestive tract: Secondary | ICD-10-CM

## 2020-02-12 DIAGNOSIS — M359 Systemic involvement of connective tissue, unspecified: Secondary | ICD-10-CM | POA: Diagnosis present

## 2020-02-12 DIAGNOSIS — R0600 Dyspnea, unspecified: Secondary | ICD-10-CM

## 2020-02-12 DIAGNOSIS — Z888 Allergy status to other drugs, medicaments and biological substances status: Secondary | ICD-10-CM

## 2020-02-12 DIAGNOSIS — K219 Gastro-esophageal reflux disease without esophagitis: Secondary | ICD-10-CM | POA: Diagnosis present

## 2020-02-12 DIAGNOSIS — K5909 Other constipation: Secondary | ICD-10-CM | POA: Diagnosis present

## 2020-02-12 DIAGNOSIS — R109 Unspecified abdominal pain: Secondary | ICD-10-CM

## 2020-02-12 DIAGNOSIS — J9811 Atelectasis: Secondary | ICD-10-CM | POA: Diagnosis not present

## 2020-02-12 DIAGNOSIS — K8000 Calculus of gallbladder with acute cholecystitis without obstruction: Principal | ICD-10-CM

## 2020-02-12 DIAGNOSIS — R1031 Right lower quadrant pain: Secondary | ICD-10-CM | POA: Diagnosis not present

## 2020-02-12 DIAGNOSIS — J9601 Acute respiratory failure with hypoxia: Secondary | ICD-10-CM | POA: Diagnosis not present

## 2020-02-12 DIAGNOSIS — K810 Acute cholecystitis: Secondary | ICD-10-CM

## 2020-02-12 DIAGNOSIS — J9690 Respiratory failure, unspecified, unspecified whether with hypoxia or hypercapnia: Secondary | ICD-10-CM | POA: Diagnosis not present

## 2020-02-12 DIAGNOSIS — R52 Pain, unspecified: Secondary | ICD-10-CM | POA: Diagnosis not present

## 2020-02-12 DIAGNOSIS — R1011 Right upper quadrant pain: Secondary | ICD-10-CM

## 2020-02-12 DIAGNOSIS — K5900 Constipation, unspecified: Secondary | ICD-10-CM | POA: Diagnosis present

## 2020-02-12 DIAGNOSIS — N390 Urinary tract infection, site not specified: Secondary | ICD-10-CM | POA: Diagnosis present

## 2020-02-12 DIAGNOSIS — K8020 Calculus of gallbladder without cholecystitis without obstruction: Secondary | ICD-10-CM | POA: Diagnosis not present

## 2020-02-12 DIAGNOSIS — M3483 Systemic sclerosis with polyneuropathy: Secondary | ICD-10-CM | POA: Diagnosis present

## 2020-02-12 DIAGNOSIS — T17908A Unspecified foreign body in respiratory tract, part unspecified causing other injury, initial encounter: Secondary | ICD-10-CM

## 2020-02-12 DIAGNOSIS — J849 Interstitial pulmonary disease, unspecified: Secondary | ICD-10-CM | POA: Diagnosis present

## 2020-02-12 DIAGNOSIS — R0902 Hypoxemia: Secondary | ICD-10-CM | POA: Diagnosis not present

## 2020-02-12 DIAGNOSIS — J90 Pleural effusion, not elsewhere classified: Secondary | ICD-10-CM | POA: Diagnosis not present

## 2020-02-12 DIAGNOSIS — E559 Vitamin D deficiency, unspecified: Secondary | ICD-10-CM | POA: Diagnosis present

## 2020-02-12 DIAGNOSIS — R112 Nausea with vomiting, unspecified: Secondary | ICD-10-CM | POA: Diagnosis not present

## 2020-02-12 DIAGNOSIS — R1084 Generalized abdominal pain: Secondary | ICD-10-CM | POA: Diagnosis not present

## 2020-02-12 DIAGNOSIS — Z20822 Contact with and (suspected) exposure to covid-19: Secondary | ICD-10-CM | POA: Diagnosis present

## 2020-02-12 DIAGNOSIS — R110 Nausea: Secondary | ICD-10-CM

## 2020-02-12 DIAGNOSIS — R000 Tachycardia, unspecified: Secondary | ICD-10-CM | POA: Diagnosis not present

## 2020-02-12 DIAGNOSIS — R918 Other nonspecific abnormal finding of lung field: Secondary | ICD-10-CM | POA: Diagnosis not present

## 2020-02-12 LAB — CBC WITH DIFFERENTIAL/PLATELET
Abs Immature Granulocytes: 0.01 10*3/uL (ref 0.00–0.07)
Basophils Absolute: 0 10*3/uL (ref 0.0–0.1)
Basophils Relative: 0 %
Eosinophils Absolute: 0.2 10*3/uL (ref 0.0–0.5)
Eosinophils Relative: 3 %
HCT: 37.1 % (ref 36.0–46.0)
Hemoglobin: 11.4 g/dL — ABNORMAL LOW (ref 12.0–15.0)
Immature Granulocytes: 0 %
Lymphocytes Relative: 15 %
Lymphs Abs: 1.2 10*3/uL (ref 0.7–4.0)
MCH: 27.5 pg (ref 26.0–34.0)
MCHC: 30.7 g/dL (ref 30.0–36.0)
MCV: 89.4 fL (ref 80.0–100.0)
Monocytes Absolute: 1 10*3/uL (ref 0.1–1.0)
Monocytes Relative: 12 %
Neutro Abs: 6 10*3/uL (ref 1.7–7.7)
Neutrophils Relative %: 70 %
Platelets: 215 10*3/uL (ref 150–400)
RBC: 4.15 MIL/uL (ref 3.87–5.11)
RDW: 13.7 % (ref 11.5–15.5)
WBC: 8.4 10*3/uL (ref 4.0–10.5)
nRBC: 0 % (ref 0.0–0.2)

## 2020-02-12 LAB — COMPREHENSIVE METABOLIC PANEL
ALT: 10 U/L (ref 0–44)
AST: 16 U/L (ref 15–41)
Albumin: 3.5 g/dL (ref 3.5–5.0)
Alkaline Phosphatase: 57 U/L (ref 38–126)
Anion gap: 6 (ref 5–15)
BUN: 8 mg/dL (ref 6–20)
CO2: 26 mmol/L (ref 22–32)
Calcium: 9.6 mg/dL (ref 8.9–10.3)
Chloride: 103 mmol/L (ref 98–111)
Creatinine, Ser: 0.61 mg/dL (ref 0.44–1.00)
GFR calc non Af Amer: >60 mL/min (ref >60)
Glucose, Bld: 114 mg/dL — ABNORMAL HIGH (ref 70–99)
Potassium: 3.3 mmol/L — ABNORMAL LOW (ref 3.5–5.1)
Sodium: 135 mmol/L (ref 135–145)
Total Bilirubin: 0.5 mg/dL (ref 0.3–1.2)
Total Protein: 8 g/dL (ref 6.5–8.1)

## 2020-02-12 LAB — URINALYSIS, ROUTINE W REFLEX MICROSCOPIC
Bilirubin Urine: NEGATIVE
Glucose, UA: NEGATIVE mg/dL
Ketones, ur: NEGATIVE mg/dL
Nitrite: NEGATIVE
Protein, ur: 100 mg/dL — AB
Specific Gravity, Urine: 1.012 (ref 1.005–1.030)
pH: 6 (ref 5.0–8.0)

## 2020-02-12 LAB — HIV ANTIBODY (ROUTINE TESTING W REFLEX): HIV Screen 4th Generation wRfx: NONREACTIVE

## 2020-02-12 LAB — HCG, QUANTITATIVE, PREGNANCY: hCG, Beta Chain, Quant, S: <1 m[IU]/mL (ref <5)

## 2020-02-12 LAB — SURGICAL PCR SCREEN
MRSA, PCR: NEGATIVE
Staphylococcus aureus: NEGATIVE

## 2020-02-12 LAB — RESPIRATORY PANEL BY RT PCR (FLU A&B, COVID)
Influenza A by PCR: NEGATIVE
Influenza B by PCR: NEGATIVE
SARS Coronavirus 2 by RT PCR: NEGATIVE

## 2020-02-12 LAB — LIPASE, BLOOD: Lipase: 20 U/L (ref 11–51)

## 2020-02-12 MED ORDER — OXYCODONE HCL 5 MG PO TABS: 5.0000 mg | ORAL_TABLET | ORAL | Status: DC | PRN

## 2020-02-12 MED ORDER — POLYETHYLENE GLYCOL 3350 17 G PO PACK: 17.0000 g | PACK | Freq: Every day | ORAL | Status: DC | PRN

## 2020-02-12 MED ORDER — BISACODYL 10 MG RE SUPP: 10.0000 mg | Freq: Every day | RECTAL | Status: DC | PRN

## 2020-02-12 MED ORDER — ONDANSETRON HCL 4 MG/2ML IJ SOLN
4.0000 mg | Freq: Once | INTRAMUSCULAR | Status: AC
  Administered 2020-02-12: 4 mg via INTRAVENOUS
  Filled 2020-02-12: qty 2

## 2020-02-12 MED ORDER — ACETAMINOPHEN 650 MG RE SUPP: 650.0000 mg | Freq: Four times a day (QID) | RECTAL | Status: DC | PRN

## 2020-02-12 MED ORDER — THYROID 60 MG PO TABS
90.0000 mg | ORAL_TABLET | Freq: Every day | ORAL | Status: DC
  Filled 2020-02-12 (×3): qty 1

## 2020-02-12 MED ORDER — SULFAMETHOXAZOLE-TRIMETHOPRIM 800-160 MG PO TABS: 1.0000 | ORAL_TABLET | Freq: Every day | ORAL | Status: DC

## 2020-02-12 MED ORDER — MACITENTAN 10 MG PO TABS
10.0000 mg | ORAL_TABLET | Freq: Every day | ORAL | Status: DC
  Filled 2020-02-12 (×5): qty 1

## 2020-02-12 MED ORDER — MORPHINE SULFATE (PF) 4 MG/ML IV SOLN
4.0000 mg | Freq: Once | INTRAVENOUS | Status: AC
  Administered 2020-02-12: 4 mg via INTRAVENOUS
  Filled 2020-02-12: qty 1

## 2020-02-12 MED ORDER — PIPERACILLIN-TAZOBACTAM 3.375 G IVPB 30 MIN
3.3750 g | Freq: Once | INTRAVENOUS | Status: AC
  Administered 2020-02-12: 3.375 g via INTRAVENOUS
  Filled 2020-02-12: qty 50

## 2020-02-12 MED ORDER — LABETALOL HCL 5 MG/ML IV SOLN
10.0000 mg | INTRAVENOUS | Status: DC | PRN
  Administered 2020-02-13: 10 mg via INTRAVENOUS
  Filled 2020-02-12: qty 4

## 2020-02-12 MED ORDER — ADULT MULTIVITAMIN W/MINERALS CH
1.0000 | ORAL_TABLET | Freq: Every day | ORAL | Status: DC
  Administered 2020-02-14 – 2020-02-15 (×2): 1 via ORAL
  Filled 2020-02-12 (×2): qty 1

## 2020-02-12 MED ORDER — BISOPROLOL FUMARATE 5 MG PO TABS: 5.0000 mg | ORAL_TABLET | Freq: Every day | ORAL | Status: DC

## 2020-02-12 MED ORDER — INFLUENZA VAC SPLIT QUAD 0.5 ML IM SUSY
0.5000 mL | PREFILLED_SYRINGE | INTRAMUSCULAR | Status: AC
  Administered 2020-02-15: 0.5 mL via INTRAMUSCULAR
  Filled 2020-02-12: qty 0.5

## 2020-02-12 MED ORDER — SODIUM CHLORIDE 0.9 % IV SOLN: 250.0000 mL | INTRAVENOUS | Status: DC | PRN

## 2020-02-12 MED ORDER — CHLORHEXIDINE GLUCONATE CLOTH 2 % EX PADS
6.0000 | MEDICATED_PAD | Freq: Once | CUTANEOUS | Status: AC
  Administered 2020-02-13: 6 via TOPICAL

## 2020-02-12 MED ORDER — SODIUM CHLORIDE 0.9 % IV BOLUS
1000.0000 mL | Freq: Once | INTRAVENOUS | Status: AC
  Administered 2020-02-12: 1000 mL via INTRAVENOUS

## 2020-02-12 MED ORDER — SODIUM CHLORIDE 0.9% FLUSH: 3.0000 mL | INTRAVENOUS | Status: DC | PRN

## 2020-02-12 MED ORDER — PREDNISOLONE ACETATE 1 % OP SUSP: 1.0000 [drp] | OPHTHALMIC | Status: DC | PRN

## 2020-02-12 MED ORDER — VITAMIN D-3 125 MCG (5000 UT) PO TABS: 10000.0000 [IU] | ORAL_TABLET | Freq: Every day | ORAL | Status: DC

## 2020-02-12 MED ORDER — HYDROMORPHONE HCL 1 MG/ML IJ SOLN
1.0000 mg | INTRAMUSCULAR | Status: DC | PRN
  Administered 2020-02-12 – 2020-02-14 (×2): 1 mg via INTRAVENOUS
  Filled 2020-02-12 (×2): qty 1

## 2020-02-12 MED ORDER — CHLORHEXIDINE GLUCONATE CLOTH 2 % EX PADS
6.0000 | MEDICATED_PAD | Freq: Once | CUTANEOUS | Status: AC
  Administered 2020-02-12: 6 via TOPICAL

## 2020-02-12 MED ORDER — HEPARIN SODIUM (PORCINE) 5000 UNIT/ML IJ SOLN
5000.0000 [IU] | Freq: Three times a day (TID) | INTRAMUSCULAR | Status: DC
  Administered 2020-02-12 – 2020-02-15 (×6): 5000 [IU] via SUBCUTANEOUS
  Filled 2020-02-12 (×7): qty 1

## 2020-02-12 MED ORDER — NINTEDANIB ESYLATE 150 MG PO CAPS: 150.0000 mg | ORAL_CAPSULE | Freq: Two times a day (BID) | ORAL | Status: DC

## 2020-02-12 MED ORDER — SODIUM CHLORIDE 0.9% FLUSH
3.0000 mL | Freq: Two times a day (BID) | INTRAVENOUS | Status: DC
  Administered 2020-02-13 – 2020-02-15 (×4): 3 mL via INTRAVENOUS

## 2020-02-12 MED ORDER — MYCOPHENOLATE MOFETIL 250 MG PO CAPS
1500.0000 mg | ORAL_CAPSULE | Freq: Two times a day (BID) | ORAL | Status: DC
  Filled 2020-02-12 (×7): qty 6

## 2020-02-12 MED ORDER — DEXTROSE-NACL 5-0.45 % IV SOLN: INTRAVENOUS | Status: DC

## 2020-02-12 MED ORDER — ONDANSETRON HCL 4 MG PO TABS: 4.0000 mg | ORAL_TABLET | Freq: Four times a day (QID) | ORAL | Status: DC | PRN

## 2020-02-12 MED ORDER — DIPHENHYDRAMINE HCL 50 MG/ML IJ SOLN
25.0000 mg | Freq: Three times a day (TID) | INTRAMUSCULAR | Status: DC | PRN
  Administered 2020-02-12: 25 mg via INTRAVENOUS
  Filled 2020-02-12: qty 1

## 2020-02-12 MED ORDER — POTASSIUM CHLORIDE 10 MEQ/100ML IV SOLN
10.0000 meq | INTRAVENOUS | Status: AC
  Administered 2020-02-12 (×3): 10 meq via INTRAVENOUS
  Filled 2020-02-12 (×4): qty 100

## 2020-02-12 MED ORDER — LACTULOSE 10 GM/15ML PO SOLN
30.0000 g | ORAL | Status: AC
  Administered 2020-02-12: 30 g via ORAL
  Filled 2020-02-12: qty 60

## 2020-02-12 MED ORDER — ONDANSETRON HCL 4 MG/2ML IJ SOLN
4.0000 mg | Freq: Four times a day (QID) | INTRAMUSCULAR | Status: DC | PRN
  Administered 2020-02-14: 4 mg via INTRAVENOUS
  Filled 2020-02-12: qty 2

## 2020-02-12 MED ORDER — ACETAMINOPHEN 325 MG PO TABS
650.0000 mg | ORAL_TABLET | Freq: Four times a day (QID) | ORAL | Status: DC | PRN
  Administered 2020-02-14: 650 mg via ORAL
  Filled 2020-02-12: qty 2

## 2020-02-12 MED ORDER — SODIUM CHLORIDE 0.9 % IV SOLN
2.0000 g | INTRAVENOUS | Status: DC
  Administered 2020-02-12 – 2020-02-15 (×4): 2 g via INTRAVENOUS
  Filled 2020-02-12 (×3): qty 20

## 2020-02-12 MED ORDER — TRAZODONE HCL 50 MG PO TABS: 50.0000 mg | ORAL_TABLET | Freq: Every evening | ORAL | Status: DC | PRN

## 2020-02-12 NOTE — ED Notes (Signed)
Pt seen here on the 5th, scheduled for gallbladder ultrasound, but returns for uncontrolled pain with nausea, no vomiting.

## 2020-02-12 NOTE — ED Provider Notes (Signed)
Patient with cholecystitis. I spoke with Dr. Arnoldo Morale general surgery and he stated to have medicine admit, put her on antibiotics and give her clear liquid diet. he would see her today and most likely do surgery tomorrow   Milton Ferguson, MD 02/12/20 [phone]

## 2020-02-12 NOTE — H&P (Addendum)
Patient Demographics:    Karen [name], is a 49 y.o. 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 Arnoldo Morale -Plan for lap chole on February 13, 2020 -IV Rocephin, antiemetics, Protonix and as needed iv pain medications as ordered---PTA patient failed oral antiemetics and also failed oral narcotics -IV fluids as patient will be n.p.o. post midnight for surgery in a.m.  2)ILD/PAH--- patient follows with Dr. Vaughan Browner, continue OFEV and Opsumit -No hypoxia  3) scleroderma/Sjogren's syndrome--- continue CellCept, continue Bactrim for prophylaxis of opportunistic infection  4)HTN-stable, continue bisoprolol 5 mg daily, IV labetalol as needed elevated BP  5) hypothyroidism--- continue thyroid replacement  6) chronic constipation--- laxatives as ordered  7) possible UTI--- on Rocephin as above #1 pending culture results  Disposition/Need for in-Hospital Stay- patient unable to be discharged at this time due to -acute calculus cholecystitis requiring iv pain control, IV fluids antiemetics and surgical intervention--PTA patient failed oral antiemetics and also failed oral narcotics  Status is: Inpatient  Remains inpatient appropriate because:acute calculus cholecystitis requiring iv  pain control, IV fluids antiemetics and surgical intervention--PTA patient failed oral antiemetics and also failed oral narcotics   Dispo: The patient is from: Home              Anticipated d/c is to: Home              Anticipated d/c date is: 2 days              Patient currently is not medically stable to d/c. Barriers: Not Clinically Stable- acute calculus cholecystitis requiring iv pain control, IV fluids antiemetics and surgical intervention--PTA patient failed oral antiemetics and also failed oral narcotics  With History of - Reviewed by me  Past Medical History:  Diagnosis Date  . GERD (gastroesophageal reflux disease)   . Hypertension   . Hypothyroidism   . ILD (interstitial lung disease) ([place]) 02/08/2018  . Interstitial lung disease ([place])   . Multinodular thyroid    benign FNA 08/2017.  Marland Kitchen Perimenopause 02/27/2019  . Scleroderma ([place])   . Vitamin D deficiency disease 02/27/2019      Past Surgical History:  Procedure Laterality Date  . ABDOMINAL HERNIA REPAIR    . BUNIONECTOMY Bilateral 10/2018  . COLONOSCOPY WITH PROPOFOL N/A 01/02/2019   Procedure: COLONOSCOPY WITH PROPOFOL;  Surgeon: Daneil Dolin, MD;  [facility]: [facility];  Service: Endoscopy;  Laterality: N/A;  12:30pm  . ESOPHAGOGASTRODUODENOSCOPY (EGD) WITH PROPOFOL N/A 01/28/2018   erosive reflux esophagitis, patulous EG Junction, no dilation, incomplete EGD due to retained food in stomach. GES thereafter with delayed gastric emptying.   Marland Kitchen RIGHT HEART CATH N/A 09/27/2017   Procedure: RIGHT HEART CATH;  Surgeon: Jolaine Artist, MD;  Location: [facility];  Service: Cardiovascular;  Laterality: N/A;  . RIGHT HEART CATH N/A 09/05/2019   Procedure: RIGHT HEART CATH;  Surgeon: Jolaine Artist, MD;  Location: [facility];  Service: Cardiovascular;  Laterality: N/A;  . UTERINE FIBROID SURGERY        Chief Complaint  Patient presents with  . Abdominal Pain      HPI:    Karen [name]   is a 49 y.o. female with past medical history relevant for scleroderma, Sjogren's syndrome, interstitial lung disease/PAH, GERD and HTN as well as hypothyroidism who presents to the ED with recurrent episodes of abdominal pain associated with nausea and vomiting  --Seen in the ED on February 09, 2020 CT abdomen and pelvis at that time showed gallstones with possible gallbladder distention, patient at that time had fever and chills -She continues to have pain and nausea, she had a phone visit with her primary care physician at the [facility]--- she was prescribed narcotics and antiemetics--- however symptoms persisted worsened --She returned to the ED for evaluation on February 11, 2020--gallbladder ultrasound is consistent with acute calculus cholecystitis --EDP discussed case with general surgeon who requested hospitalization for possible lap chole - Patient is presenting with acute calculus cholecystitis requiring iv pain control, IV fluids antiemetics and surgical intervention--PTA patient failed oral antiemetics and also failed oral narcotics  -Recent abdominal imaging consistent with constipation -UA suggestive of UTI -CMP unremarkable except for potassium of 3.3 -Lipase is 20 -WBC is 8.4 -Abdominal ultrasound impression-:-Cholelithiasis with gallbladder wall thickening and positive sonographic Murphy sign. Imaging features consistent with acute cholecystitis   Review of systems:    In addition to the HPI above,   A full Review of  Systems was done, all other systems reviewed are negative except as noted above in HPI , .    Social History:  Reviewed by me    Social History   Tobacco Use  . Smoking status: Never Smoker  . Smokeless tobacco: Never Used  Substance Use Topics  . Alcohol use: No       Family History :  Reviewed by me    Family History  Problem Relation Age of Onset  . Hypertension Father   . Hypertension Sister   . Hypertension Brother   . Diabetes Maternal  Grandfather   . Diabetes Maternal Uncle   . Diabetes Mother   . Colon cancer Neg Hx      Home Medications:   Prior to Admission medications   Medication Sig Start Date End Date Taking? Authorizing Provider  azelastine (OPTIVAR) 0.05 % ophthalmic solution Place 1 drop into both eyes daily as needed (allergies).  09/29/19  Yes [provider]  bisoprolol (ZEBETA) 5 MG tablet Take 1 tablet (5 mg total) by mouth daily. 01/16/20  Yes Bensimhon, Shaune Pascal, MD  Cholecalciferol (VITAMIN D-3) 5000 units TABS Take 10,000 Units by mouth daily.    Yes [provider]  esomeprazole (NEXIUM) 40 MG capsule TAKE 1 CAPSULE BY MOUTH TWO TIMES DAILY Patient taking differently: Take 40 mg by mouth in the morning and at bedtime.  08/21/19  Yes Mahala Menghini, PA-C  furosemide (LASIX) 40 MG tablet Take 40 mg by mouth as needed for edema. Except Saturdays and Sundays    Yes [provider]  macitentan (OPSUMIT) 10 MG tablet Take 1 tablet (10 mg total) by mouth daily. 09/11/19  Yes Bensimhon, Shaune Pascal, MD  Multiple Vitamins-Minerals (MEGA MULTIVITAMIN) POWD Take 1 Scoop by mouth daily. Mix 1 scoop in water   Yes [provider]  mycophenolate (CELLCEPT) 500 MG tablet TAKE 3 TABLETS BY MOUTH IN THE MORNING AND 3 TABLETS IN THE EVENING Patient taking differently: Take 1,500 mg by mouth 2 (two) times daily.  11/20/19  Yes Mannam, Praveen, MD  Nintedanib (OFEV) 150 MG CAPS Take 1 capsule (150 mg total) by mouth 2 (two) times daily. 11/20/19  Yes Mannam, Praveen, MD  NP THYROID 90 MG tablet Take 1 tablet (90 mg total) by mouth daily. 01/29/20  Yes Gosrani, Nimish C, MD  ondansetron (ZOFRAN) 4 MG tablet Take 1 tablet (4 mg total) by mouth every 8 (eight) hours as needed for nausea or vomiting. 02/11/20  Yes Gosrani, Nimish C, MD  oxyCODONE (OXY IR/ROXICODONE) 5 MG immediate release tablet Take 1 tablet (5 mg total) by mouth every 4 (four) hours as needed for severe pain. 02/11/20  Yes Gosrani,  Nimish C, MD  potassium chloride 20 MEQ/15ML (10%) SOLN Take 20 mEq by mouth as needed (with Lasix for Edema). Except on saturdays and sundays    Yes [provider]  prednisoLONE acetate (PRED FORTE) 1 % ophthalmic suspension Place 1 drop into both eyes as needed (irritation).  09/18/19  Yes [provider]  spironolactone (ALDACTONE) 25 MG tablet TAKE 1 TABLET BY MOUTH DAILY. Patient taking differently: Take 25 mg by mouth daily.  11/20/19  Yes Bensimhon, Shaune Pascal, MD  sulfamethoxazole-trimethoprim (BACTRIM DS) 800-160 MG tablet Take 1 tablet by mouth daily. 11/20/19  Yes Mannam, Hart Robinsons, MD  thyroid (NP THYROID) 60 MG tablet Take 1 tablet (60 mg total) by mouth daily before breakfast. Patient not taking: Reported on 02/12/2020 09/15/19   [name], MD     Allergies:     Allergies  Allergen Reactions  . Lisinopril Hives and Swelling     Physical Exam:   Vitals  Blood pressure (!) 137/101, pulse 92, temperature 98.5 F (36.9 C), temperature source Oral, resp. rate 20, height 5\' 5"  (1.651 m), weight 59 kg, SpO2 97 %.  Physical Examination: General appearance - alert, uncomfortable with pain mental status - alert, oriented to person, place, and time,  Eyes - sclera anicteric Neck - supple, no JVD elevation , Chest - clear  to auscultation bilaterally, symmetrical air movement,  Heart - S1 and S2 normal, regular  Abdomen - soft, periumbilical and right-sided abdominal tenderness, no significant rebound or guarding nondistended, no masses or organomegaly Neurological - screening mental status exam normal, neck supple without rigidity, cranial nerves II through XII intact, DTR's normal and symmetric Extremities - no pedal edema noted, intact peripheral pulses  Skin - warm, dry     Data Review:    CBC Recent Labs  Lab 02/09/20 2352 02/12/20 0230  WBC 11.6* 8.4  HGB 12.3 11.4*  HCT 39.7 37.1  PLT 220 215  MCV 87.8 89.4  MCH 27.2 27.5  MCHC 31.0 30.7    RDW 13.8 13.7  LYMPHSABS 1.3 1.2  MONOABS 1.2* 1.0  EOSABS 0.0 0.2  BASOSABS 0.0 0.0   ------------------------------------------------------------------------------------------------------------------  Chemistries  Recent Labs  Lab 02/09/20 2352 02/12/20 0230  NA 134*  135  K 3.2* 3.3*  CL 101 103  CO2 22 26  GLUCOSE 107* 114*  BUN 9 8  CREATININE 0.53 0.61  CALCIUM 10.4* 9.6  AST 18 16  ALT 11 10  ALKPHOS 66 57  BILITOT 1.1 0.5   ------------------------------------------------------------------------------------------------------------------ estimated creatinine clearance is 74.9 mL/min (by C-G formula based on SCr of 0.61 mg/dL). ------------------------------------------------------------------------------------------------------------------ No results for input(s): TSH, T4TOTAL, T3FREE, THYROIDAB in the last 72 hours.  Invalid input(s): FREET3   Coagulation profile No results for input(s): INR, PROTIME in the last 168 hours. ------------------------------------------------------------------------------------------------------------------- No results for input(s): DDIMER in the last 72 hours. -------------------------------------------------------------------------------------------------------------------  Cardiac Enzymes No results for input(s): CKMB, TROPONINI, MYOGLOBIN in the last 168 hours.  Invalid input(s): CK ------------------------------------------------------------------------------------------------------------------    Component Value Date/Time   BNP 199.4 (H) 01/30/2019 1416     ---------------------------------------------------------------------------------------------------------------  Urinalysis    Component Value Date/Time   COLORURINE YELLOW 02/12/2020 [place] 02/12/2020 0444   LABSPEC 1.012 02/12/2020 0444   PHURINE 6.0 02/12/2020 0444   GLUCOSEU NEGATIVE 02/12/2020 0444   HGBUR LARGE (A) 02/12/2020 0444    BILIRUBINUR NEGATIVE 02/12/2020 [place] 02/12/2020 0444   PROTEINUR 100 (A) 02/12/2020 0444   NITRITE NEGATIVE 02/12/2020 0444   LEUKOCYTESUR TRACE (A) 02/12/2020 0444    ----------------------------------------------------------------------------------------------------------------   Imaging Results:    US Abdomen Limited RUQ  Result Date: 02/12/2020 CLINICAL DATA:  3 day history of right upper quadrant pain. EXAM: ULTRASOUND ABDOMEN LIMITED RIGHT UPPER QUADRANT COMPARISON:  Abdomen/pelvis CT 02/10/2020 FINDINGS: Gallbladder: As on recent CT scan, multiple calcified gallstones evident. Shadowing makes measurement of the stones difficult but these probably measure up to at least 2.8 cm. Gallbladder wall is thickened at 4 mm. Sonographer reports a positive sonographic Murphy sign. Common bile duct: Diameter: 4-5 mm Liver: No focal lesion identified. Within normal limits in parenchymal echogenicity. Portal vein is patent on color Doppler imaging with normal direction of blood flow towards the liver. Other: None. IMPRESSION: Cholelithiasis with gallbladder wall thickening and positive sonographic Murphy sign. Imaging features consistent with acute cholecystitis. Electronically Signed   By: Misty Stanley M.D.   On: 02/12/2020 08:08    Radiological Exams on Admission: US Abdomen Limited RUQ  Result Date: 02/12/2020 CLINICAL DATA:  3 day history of right upper quadrant pain. EXAM: ULTRASOUND ABDOMEN LIMITED RIGHT UPPER QUADRANT COMPARISON:  Abdomen/pelvis CT 02/10/2020 FINDINGS: Gallbladder: As on recent CT scan, multiple calcified gallstones evident. Shadowing makes measurement of the stones difficult but these probably measure up to at least 2.8 cm. Gallbladder wall is thickened at 4 mm. Sonographer reports a positive sonographic Murphy sign. Common bile duct: Diameter: 4-5 mm Liver: No focal lesion identified. Within normal limits in parenchymal echogenicity. Portal vein is patent  on color Doppler imaging with normal direction of blood flow towards the liver. Other: None. IMPRESSION: Cholelithiasis with gallbladder wall thickening and positive sonographic Murphy sign. Imaging features consistent with acute cholecystitis. Electronically Signed   By: Misty Stanley M.D.   On: 02/12/2020 08:08    DVT Prophylaxis -SCD /heparin AM Labs Ordered, also please review Full Orders  Family Communication: Admission, patients condition and plan of care including tests being ordered have been discussed with the patient who indicate understanding and agree with the plan   Code Status - Full Code  Likely DC to  Home after lap chole if tolerating oral intake  Condition   stable  Roxan Hockey M.D on 02/12/2020 at 4:58 PM  Go to [email or web address] -  for contact info  [facility] - Office  [phone]

## 2020-02-12 NOTE — ED Notes (Signed)
Pt requested to be able to drink something by mouth. Dr. Michela Pitcher no due to testing. He offered 500 of NS and pt refused. Nurse offered mouth swab and pt refused.

## 2020-02-12 NOTE — Consult Note (Signed)
Reason for Consult: Right upper quadrant abdominal pain Referring Physician: Dr. Noel Gerold is an 49 y.o. female.  HPI: Patient is a 49 year old black female recently diagnosed with scleroderma and interstitial lung disease who presented to the emergency room with worsening right upper quadrant abdominal pain, nausea, and vomiting.  She states she has had multiple episodes of this over the past year.  She does have a dietitian who is helping her adjust her diet.  She states that the pain comes on suddenly and is only relieved with belching or emesis.  She denies any fever or chills.  Ultrasound the gallbladder reveals cholelithiasis with gallbladder wall thickening and a positive sonographic Murphy sign.  She denies any fever, chills, or jaundice.  Past Medical History:  Diagnosis Date  . GERD (gastroesophageal reflux disease)   . Hypertension   . Hypothyroidism   . ILD (interstitial lung disease) ([place]) 02/08/2018  . Interstitial lung disease ([place])   . Multinodular thyroid    benign FNA 08/2017.  Marland Kitchen Perimenopause 02/27/2019  . Scleroderma ([place])   . Vitamin D deficiency disease 02/27/2019    Past Surgical History:  Procedure Laterality Date  . ABDOMINAL HERNIA REPAIR    . BUNIONECTOMY Bilateral 10/2018  . COLONOSCOPY WITH PROPOFOL N/A 01/02/2019   Procedure: COLONOSCOPY WITH PROPOFOL;  Surgeon: Daneil Dolin, MD;  [facility];  Service: Endoscopy;  Laterality: N/A;  12:30pm  . ESOPHAGOGASTRODUODENOSCOPY (EGD) WITH PROPOFOL N/A 01/28/2018   erosive reflux esophagitis, patulous EG Junction, no dilation, incomplete EGD due to retained food in stomach. GES thereafter with delayed gastric emptying.   Marland Kitchen RIGHT HEART CATH N/A 09/27/2017   Procedure: RIGHT HEART CATH;  Surgeon: Jolaine Artist, MD;  Location: [place] [facility];  Service: Cardiovascular;  Laterality: N/A;  . RIGHT HEART CATH N/A 09/05/2019   Procedure: RIGHT HEART CATH;  Surgeon: Jolaine Artist, MD;   Location: [place] [facility];  Service: Cardiovascular;  Laterality: N/A;  . UTERINE FIBROID SURGERY      Family History  Problem Relation Age of Onset  . Hypertension Father   . Hypertension Sister   . Hypertension Brother   . Diabetes Maternal Grandfather   . Diabetes Maternal Uncle   . Diabetes Mother   . Colon cancer Neg Hx     Social History:  reports that she has never smoked. She has never used smokeless tobacco. She reports that she does not drink alcohol and does not use drugs.  Allergies:  Allergies  Allergen Reactions  . Lisinopril Hives and Swelling    Medications: I have reviewed the patient's current medications.  Results for orders placed or performed during the hospital encounter of 02/12/20 (from the past 48 hour(s))  CBC with Differential     Status: Abnormal   Collection Time: 02/12/20  2:30 AM  Result Value Ref Range   WBC 8.4 4.0 - 10.5 K/uL   RBC 4.15 3.87 - 5.11 MIL/uL   Hemoglobin 11.4 (L) 12.0 - 15.0 g/dL   HCT 37.1 36 - 46 %   MCV 89.4 80.0 - 100.0 fL   MCH 27.5 26.0 - 34.0 pg   MCHC 30.7 30.0 - 36.0 g/dL   RDW 13.7 11.5 - 15.5 %   Platelets 215 150 - 400 K/uL   nRBC 0.0 0.0 - 0.2 %   Neutrophils Relative % 70 %   Neutro Abs 6.0 1.7 - 7.7 K/uL   Lymphocytes Relative 15 %   Lymphs Abs  1.2 0.7 - 4.0 K/uL   Monocytes Relative 12 %   Monocytes Absolute 1.0 0.1 - 1.0 K/uL   Eosinophils Relative 3 %   Eosinophils Absolute 0.2 0 - 0 K/uL   Basophils Relative 0 %   Basophils Absolute 0.0 0 - 0 K/uL   Immature Granulocytes 0 %   Abs Immature Granulocytes 0.01 0.00 - 0.07 K/uL    Comment: Performed at [facility], [address], [place], [place] [address]  Comprehensive metabolic panel     Status: Abnormal   Collection Time: 02/12/20  2:30 AM  Result Value Ref Range   Sodium 135 135 - 145 mmol/L   Potassium 3.3 (L) 3.5 - 5.1 mmol/L   Chloride 103 98 - 111 mmol/L   CO2 26 22 - 32 mmol/L   Glucose, Bld 114 (H) 70 - 99 mg/dL    Comment:  Glucose reference range applies only to samples taken after fasting for at least 8 hours.   BUN 8 6 - 20 mg/dL   Creatinine, Ser 0.61 0.44 - 1.00 mg/dL   Calcium 9.6 8.9 - 10.3 mg/dL   Total Protein 8.0 6.5 - 8.1 g/dL   Albumin 3.5 3.5 - 5.0 g/dL   AST 16 15 - 41 U/L   ALT 10 0 - 44 U/L   Alkaline Phosphatase 57 38 - 126 U/L   Total Bilirubin 0.5 0.3 - 1.2 mg/dL   GFR calc non Af Amer >60 >60 mL/min   Anion gap 6 5 - 15    Comment: Performed at [facility], [address], [place], [place] [address]  Lipase, blood     Status: None   Collection Time: 02/12/20  2:30 AM  Result Value Ref Range   Lipase 20 11 - 51 U/L    Comment: Performed at [facility], [address], [place], [place] [address]  hCG, quantitative, pregnancy     Status: None   Collection Time: 02/12/20  2:30 AM  Result Value Ref Range   hCG, Beta Chain, Quant, S <1 <5 mIU/mL    Comment:          GEST. AGE      CONC.  (mIU/mL)   <=1 WEEK        5 - 50     2 WEEKS       50 - 500     3 WEEKS       100 - 10,000     4 WEEKS     1,000 - 30,000     5 WEEKS     3,500 - 115,000   6-8 WEEKS     12,000 - 270,000    12 WEEKS     15,000 - 220,000        FEMALE AND NON-PREGNANT FEMALE:     LESS THAN 5 mIU/mL Performed at [facility], [address] [place],  [address]   Urinalysis, Routine w reflex microscopic     Status: Abnormal   Collection Time: 02/12/20  4:44 AM  Result Value Ref Range   Color, Urine YELLOW YELLOW   APPearance CLEAR CLEAR   Specific Gravity, Urine 1.012 1.005 - 1.030   pH 6.0 5.0 - 8.0   Glucose, UA NEGATIVE NEGATIVE mg/dL   Hgb urine dipstick LARGE (A) NEGATIVE   Bilirubin Urine NEGATIVE NEGATIVE   Ketones, ur NEGATIVE NEGATIVE mg/dL   Protein, ur 100 (A) NEGATIVE mg/dL   Nitrite NEGATIVE NEGATIVE   Leukocytes,Ua TRACE (A) NEGATIVE  RBC / HPF 21-50 0 - 5 RBC/hpf   WBC, UA 6-10 0 - 5 WBC/hpf   Bacteria, UA RARE (A) NONE SEEN   Squamous Epithelial / LPF 0-5 0 - 5   Mucus PRESENT     Uric Acid Crys, UA PRESENT     Comment: Performed at [facility], [address], [place], [place] [address]  Respiratory Panel by RT PCR (Flu A&B, Covid) - Nasopharyngeal Swab     Status: None   Collection Time: 02/12/20 12:33 PM   Specimen: Nasopharyngeal Swab  Result Value Ref Range   SARS Coronavirus 2 by RT PCR NEGATIVE NEGATIVE    Comment: (NOTE) SARS-CoV-2 target nucleic acids are NOT DETECTED.  The SARS-CoV-2 RNA is generally detectable in upper respiratoy specimens during the acute phase of infection. The lowest concentration of SARS-CoV-2 viral copies this assay can detect is 131 copies/mL. A negative result does not preclude SARS-Cov-2 infection and should not be used as the sole basis for treatment or other patient management decisions. A negative result may occur with  improper specimen collection/handling, submission of specimen other than nasopharyngeal swab, presence of viral mutation(s) within the areas targeted by this assay, and inadequate number of viral copies (<131 copies/mL). A negative result must be combined with clinical observations, patient history, and epidemiological information. The expected result is Negative.  Fact Sheet for Patients:  PinkCheek.be  Fact Sheet for Healthcare Providers:  GravelBags.it  This test is no t yet approved or cleared by the [place] FDA and  has been authorized for detection and/or diagnosis of SARS-CoV-2 by FDA under an Emergency Use Authorization (EUA). This EUA will remain  in effect (meaning this test can be used) for the duration of the COVID-19 declaration under Section 564(b)(1) of the Act, 21 U.S.C. section 360bbb-3(b)(1), unless the authorization is terminated or revoked sooner.     Influenza A by PCR NEGATIVE NEGATIVE   Influenza B by PCR NEGATIVE NEGATIVE    Comment: (NOTE) The Xpert Xpress SARS-CoV-2/FLU/RSV assay is intended as an aid in  the  diagnosis of influenza from Nasopharyngeal swab specimens and  should not be used as a sole basis for treatment. Nasal washings and  aspirates are unacceptable for Xpert Xpress SARS-CoV-2/FLU/RSV  testing.  Fact Sheet for Patients: PinkCheek.be  Fact Sheet for Healthcare Providers: GravelBags.it  This test is not yet approved or cleared by the [place] FDA and  has been authorized for detection and/or diagnosis of SARS-CoV-2 by  FDA under an Emergency Use Authorization (EUA). This EUA will remain  in effect (meaning this test can be used) for the duration of the  Covid-19 declaration under Section 564(b)(1) of the Act, 21  U.S.C. section 360bbb-3(b)(1), unless the authorization is  terminated or revoked. Performed at [facility], [address], [place], [place] [address]     US Abdomen Limited RUQ  Result Date: 02/12/2020 CLINICAL DATA:  3 day history of right upper quadrant pain. EXAM: ULTRASOUND ABDOMEN LIMITED RIGHT UPPER QUADRANT COMPARISON:  Abdomen/pelvis CT 02/10/2020 FINDINGS: Gallbladder: As on recent CT scan, multiple calcified gallstones evident. Shadowing makes measurement of the stones difficult but these probably measure up to at least 2.8 cm. Gallbladder wall is thickened at 4 mm. Sonographer reports a positive sonographic Murphy sign. Common bile duct: Diameter: 4-5 mm Liver: No focal lesion identified. Within normal limits in parenchymal echogenicity. Portal vein is patent on color Doppler imaging with normal direction of blood flow towards the liver. Other: None. IMPRESSION: Cholelithiasis  with gallbladder wall thickening and positive sonographic Murphy sign. Imaging features consistent with acute cholecystitis. Electronically Signed   By: Misty Stanley M.D.   On: 02/12/2020 08:08    ROS:  Pertinent items are noted in HPI.  Blood pressure (!) 137/101, pulse 92, temperature 98.5 F (36.9 C), temperature  source Oral, resp. rate 20, height 5\' 5"  (1.651 m), weight 59 kg, SpO2 97 %. Physical Exam: Pleasant black female no acute distress Head is normocephalic, atraumatic Eyes without scleral icterus Lungs clear to auscultation with good breath sounds bilaterally Heart examination reveals regular rate and rhythm without S3, S4, murmurs Abdomen is soft with discomfort to palpation in the right upper quadrant.  No rigidity is noted.  No distention is noted.  All labs reviewed.  Assessment/Plan: Impression: Biliary colic, cholecystitis, cholelithiasis, scleroderma, interstitial lung disease Plan: Due to the recurrent nature of her symptoms, we will proceed with laparoscopic cholecystectomy tomorrow.  The risks and benefits of the procedure including bleeding, infection, hepatobiliary injury, the possibility of an open procedure were fully explained to the patient, who gave informed consent.  Karen [name] 02/12/2020, 3:55 PM

## 2020-02-12 NOTE — H&P (View-Only) (Signed)
Reason for Consult: Right upper quadrant abdominal pain Referring Physician: Dr. Noel Gerold is an 49 y.o. female.  HPI: Patient is a 49 year old black female recently diagnosed with scleroderma and interstitial lung disease who presented to the emergency room with worsening right upper quadrant abdominal pain, nausea, and vomiting.  She states she has had multiple episodes of this over the past year.  She does have a dietitian who is helping her adjust her diet.  She states that the pain comes on suddenly and is only relieved with belching or emesis.  She denies any fever or chills.  Ultrasound the gallbladder reveals cholelithiasis with gallbladder wall thickening and a positive sonographic Murphy sign.  She denies any fever, chills, or jaundice.  Past Medical History:  Diagnosis Date  . GERD (gastroesophageal reflux disease)   . Hypertension   . Hypothyroidism   . ILD (interstitial lung disease) ([place]) 02/08/2018  . Interstitial lung disease ([place])   . Multinodular thyroid    benign FNA 08/2017.  Marland Kitchen Perimenopause 02/27/2019  . Scleroderma ([place])   . Vitamin D deficiency disease 02/27/2019    Past Surgical History:  Procedure Laterality Date  . ABDOMINAL HERNIA REPAIR    . BUNIONECTOMY Bilateral 10/2018  . COLONOSCOPY WITH PROPOFOL N/A 01/02/2019   Procedure: COLONOSCOPY WITH PROPOFOL;  Surgeon: Daneil Dolin, MD;  [facility];  Service: Endoscopy;  Laterality: N/A;  12:30pm  . ESOPHAGOGASTRODUODENOSCOPY (EGD) WITH PROPOFOL N/A 01/28/2018   erosive reflux esophagitis, patulous EG Junction, no dilation, incomplete EGD due to retained food in stomach. GES thereafter with delayed gastric emptying.   Marland Kitchen RIGHT HEART CATH N/A 09/27/2017   Procedure: RIGHT HEART CATH;  Surgeon: Jolaine Artist, MD;  Location: [place] [facility];  Service: Cardiovascular;  Laterality: N/A;  . RIGHT HEART CATH N/A 09/05/2019   Procedure: RIGHT HEART CATH;  Surgeon: Jolaine Artist, MD;   Location: [place] [facility];  Service: Cardiovascular;  Laterality: N/A;  . UTERINE FIBROID SURGERY      Family History  Problem Relation Age of Onset  . Hypertension Father   . Hypertension Sister   . Hypertension Brother   . Diabetes Maternal Grandfather   . Diabetes Maternal Uncle   . Diabetes Mother   . Colon cancer Neg Hx     Social History:  reports that she has never smoked. She has never used smokeless tobacco. She reports that she does not drink alcohol and does not use drugs.  Allergies:  Allergies  Allergen Reactions  . Lisinopril Hives and Swelling    Medications: I have reviewed the patient's current medications.  Results for orders placed or performed during the hospital encounter of 02/12/20 (from the past 48 hour(s))  CBC with Differential     Status: Abnormal   Collection Time: 02/12/20  2:30 AM  Result Value Ref Range   WBC 8.4 4.0 - 10.5 K/uL   RBC 4.15 3.87 - 5.11 MIL/uL   Hemoglobin 11.4 (L) 12.0 - 15.0 g/dL   HCT 37.1 36 - 46 %   MCV 89.4 80.0 - 100.0 fL   MCH 27.5 26.0 - 34.0 pg   MCHC 30.7 30.0 - 36.0 g/dL   RDW 13.7 11.5 - 15.5 %   Platelets 215 150 - 400 K/uL   nRBC 0.0 0.0 - 0.2 %   Neutrophils Relative % 70 %   Neutro Abs 6.0 1.7 - 7.7 K/uL   Lymphocytes Relative 15 %   Lymphs Abs  1.2 0.7 - 4.0 K/uL   Monocytes Relative 12 %   Monocytes Absolute 1.0 0.1 - 1.0 K/uL   Eosinophils Relative 3 %   Eosinophils Absolute 0.2 0 - 0 K/uL   Basophils Relative 0 %   Basophils Absolute 0.0 0 - 0 K/uL   Immature Granulocytes 0 %   Abs Immature Granulocytes 0.01 0.00 - 0.07 K/uL    Comment: Performed at [facility], [address], [place], [place] [address]  Comprehensive metabolic panel     Status: Abnormal   Collection Time: 02/12/20  2:30 AM  Result Value Ref Range   Sodium 135 135 - 145 mmol/L   Potassium 3.3 (L) 3.5 - 5.1 mmol/L   Chloride 103 98 - 111 mmol/L   CO2 26 22 - 32 mmol/L   Glucose, Bld 114 (H) 70 - 99 mg/dL    Comment:  Glucose reference range applies only to samples taken after fasting for at least 8 hours.   BUN 8 6 - 20 mg/dL   Creatinine, Ser 0.61 0.44 - 1.00 mg/dL   Calcium 9.6 8.9 - 10.3 mg/dL   Total Protein 8.0 6.5 - 8.1 g/dL   Albumin 3.5 3.5 - 5.0 g/dL   AST 16 15 - 41 U/L   ALT 10 0 - 44 U/L   Alkaline Phosphatase 57 38 - 126 U/L   Total Bilirubin 0.5 0.3 - 1.2 mg/dL   GFR calc non Af Amer >60 >60 mL/min   Anion gap 6 5 - 15    Comment: Performed at [facility], [address], [place], [place] [address]  Lipase, blood     Status: None   Collection Time: 02/12/20  2:30 AM  Result Value Ref Range   Lipase 20 11 - 51 U/L    Comment: Performed at [facility], [address] [place], [place] [address]  hCG, quantitative, pregnancy     Status: None   Collection Time: 02/12/20  2:30 AM  Result Value Ref Range   hCG, Beta Chain, Quant, S <1 <5 mIU/mL    Comment:          GEST. AGE      CONC.  (mIU/mL)   <=1 WEEK        5 - 50     2 WEEKS       50 - 500     3 WEEKS       100 - 10,000     4 WEEKS     1,000 - 30,000     5 WEEKS     3,500 - 115,000   6-8 WEEKS     12,000 - 270,000    12 WEEKS     15,000 - 220,000        FEMALE AND NON-PREGNANT FEMALE:     LESS THAN 5 mIU/mL Performed at [facility], [address] [place],  [address]   Urinalysis, Routine w reflex microscopic     Status: Abnormal   Collection Time: 02/12/20  4:44 AM  Result Value Ref Range   Color, Urine YELLOW YELLOW   APPearance CLEAR CLEAR   Specific Gravity, Urine 1.012 1.005 - 1.030   pH 6.0 5.0 - 8.0   Glucose, UA NEGATIVE NEGATIVE mg/dL   Hgb urine dipstick LARGE (A) NEGATIVE   Bilirubin Urine NEGATIVE NEGATIVE   Ketones, ur NEGATIVE NEGATIVE mg/dL   Protein, ur 100 (A) NEGATIVE mg/dL   Nitrite NEGATIVE NEGATIVE   Leukocytes,Ua TRACE (A) NEGATIVE  RBC / HPF 21-50 0 - 5 RBC/hpf   WBC, UA 6-10 0 - 5 WBC/hpf   Bacteria, UA RARE (A) NONE SEEN   Squamous Epithelial / LPF 0-5 0 - 5   Mucus PRESENT     Uric Acid Crys, UA PRESENT     Comment: Performed at [facility], [address] [place], [place] [address]  Respiratory Panel by RT PCR (Flu A&B, Covid) - Nasopharyngeal Swab     Status: None   Collection Time: 02/12/20 12:33 PM   Specimen: Nasopharyngeal Swab  Result Value Ref Range   SARS Coronavirus 2 by RT PCR NEGATIVE NEGATIVE    Comment: (NOTE) SARS-CoV-2 target nucleic acids are NOT DETECTED.  The SARS-CoV-2 RNA is generally detectable in upper respiratoy specimens during the acute phase of infection. The lowest concentration of SARS-CoV-2 viral copies this assay can detect is 131 copies/mL. A negative result does not preclude SARS-Cov-2 infection and should not be used as the sole basis for treatment or other patient management decisions. A negative result may occur with  improper specimen collection/handling, submission of specimen other than nasopharyngeal swab, presence of viral mutation(s) within the areas targeted by this assay, and inadequate number of viral copies (<131 copies/mL). A negative result must be combined with clinical observations, patient history, and epidemiological information. The expected result is Negative.  Fact Sheet for Patients:  PinkCheek.be  Fact Sheet for Healthcare Providers:  GravelBags.it  This test is no t yet approved or cleared by the [place] FDA and  has been authorized for detection and/or diagnosis of SARS-CoV-2 by FDA under an Emergency Use Authorization (EUA). This EUA will remain  in effect (meaning this test can be used) for the duration of the COVID-19 declaration under Section 564(b)(1) of the Act, 21 U.S.C. section 360bbb-3(b)(1), unless the authorization is terminated or revoked sooner.     Influenza A by PCR NEGATIVE NEGATIVE   Influenza B by PCR NEGATIVE NEGATIVE    Comment: (NOTE) The Xpert Xpress SARS-CoV-2/FLU/RSV assay is intended as an aid in  the  diagnosis of influenza from Nasopharyngeal swab specimens and  should not be used as a sole basis for treatment. Nasal washings and  aspirates are unacceptable for Xpert Xpress SARS-CoV-2/FLU/RSV  testing.  Fact Sheet for Patients: PinkCheek.be  Fact Sheet for Healthcare Providers: GravelBags.it  This test is not yet approved or cleared by the [place] FDA and  has been authorized for detection and/or diagnosis of SARS-CoV-2 by  FDA under an Emergency Use Authorization (EUA). This EUA will remain  in effect (meaning this test can be used) for the duration of the  Covid-19 declaration under Section 564(b)(1) of the Act, 21  U.S.C. section 360bbb-3(b)(1), unless the authorization is  terminated or revoked. Performed at [facility], [address], [place], [place] [address]     US Abdomen Limited RUQ  Result Date: 02/12/2020 CLINICAL DATA:  3 day history of right upper quadrant pain. EXAM: ULTRASOUND ABDOMEN LIMITED RIGHT UPPER QUADRANT COMPARISON:  Abdomen/pelvis CT 02/10/2020 FINDINGS: Gallbladder: As on recent CT scan, multiple calcified gallstones evident. Shadowing makes measurement of the stones difficult but these probably measure up to at least 2.8 cm. Gallbladder wall is thickened at 4 mm. Sonographer reports a positive sonographic Murphy sign. Common bile duct: Diameter: 4-5 mm Liver: No focal lesion identified. Within normal limits in parenchymal echogenicity. Portal vein is patent on color Doppler imaging with normal direction of blood flow towards the liver. Other: None. IMPRESSION: Cholelithiasis  with gallbladder wall thickening and positive sonographic Murphy sign. Imaging features consistent with acute cholecystitis. Electronically Signed   By: Misty Stanley M.D.   On: 02/12/2020 08:08    ROS:  Pertinent items are noted in HPI.  Blood pressure (!) 137/101, pulse 92, temperature 98.5 F (36.9 C), temperature  source Oral, resp. rate 20, height 5\' 5"  (1.651 m), weight 59 kg, SpO2 97 %. Physical Exam: Pleasant black female no acute distress Head is normocephalic, atraumatic Eyes without scleral icterus Lungs clear to auscultation with good breath sounds bilaterally Heart examination reveals regular rate and rhythm without S3, S4, murmurs Abdomen is soft with discomfort to palpation in the right upper quadrant.  No rigidity is noted.  No distention is noted.  All labs reviewed.  Assessment/Plan: Impression: Biliary colic, cholecystitis, cholelithiasis, scleroderma, interstitial lung disease Plan: Due to the recurrent nature of her symptoms, we will proceed with laparoscopic cholecystectomy tomorrow.  The risks and benefits of the procedure including bleeding, infection, hepatobiliary injury, the possibility of an open procedure were fully explained to the patient, who gave informed consent.  Aviva Signs 02/12/2020, 3:55 PM

## 2020-02-12 NOTE — ED Provider Notes (Signed)
[facility] EMERGENCY DEPARTMENT Provider Note   CSN: [id number] Arrival date & time: 02/12/20  0053     History Chief Complaint  Patient presents with  . Abdominal Pain    Karen [name] is a 49 y.o. female.  Pt presents to the ED today with abdominal pain.  She was seen in the ED on 10/5.  She had a CT scan which showed gallstones.  She is scheduled for a GB US later today.  Pt said she could not stand the pain anymore, so she called EMS to bring her here.  Pt said her pain is more in the lower abdomen.  She was noted to be constipated on the CT scan.  She said she had a large bm, but it did not help her pain.  No f/c.        Past Medical History:  Diagnosis Date  . GERD (gastroesophageal reflux disease)   . Hypertension   . Hypothyroidism   . ILD (interstitial lung disease) ([place]) 02/08/2018  . Interstitial lung disease ([place])   . Multinodular thyroid    benign FNA 08/2017.  Marland Kitchen Perimenopause 02/27/2019  . Scleroderma ([place])   . Vitamin D deficiency disease 02/27/2019    Patient Active Problem List   Diagnosis Date Noted  . Bloating 01/22/2020  . Vitamin D deficiency disease 02/27/2019  . Perimenopause 02/27/2019  . Constipation 10/11/2018  . RLQ abdominal pain 09/03/2018  . Gastroparesis 07/03/2018  . Intrinsic atopic dermatitis 06/12/2018  . Neoplasm of uncertain behavior of skin 06/12/2018  . 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 ILD (interstitial lung disease) ([place]) 02/08/2018  . GERD (gastroesophageal reflux disease) 12/18/2017  . Esophageal dysphagia 12/18/2017  . Acne vulgaris 12/05/2017  . Eczema 12/05/2017  . Scleroderma ([place]) 10/24/2017  . Multinodular goiter 08/30/2017  . Sjogren's syndrome ([place]) 08/30/2017  . HTN (hypertension) 03/23/2017  . Hypertensive disorder 04/14/2016    Past Surgical History:  Procedure Laterality Date  . ABDOMINAL HERNIA REPAIR    . BUNIONECTOMY Bilateral 10/2018  . COLONOSCOPY WITH PROPOFOL  N/A 01/02/2019   Procedure: COLONOSCOPY WITH PROPOFOL;  Surgeon: Daneil Dolin, MD;  Location: [facility];  Service: Endoscopy;  Laterality: N/A;  12:30pm  . ESOPHAGOGASTRODUODENOSCOPY (EGD) WITH PROPOFOL N/A 01/28/2018   erosive reflux esophagitis, patulous EG Junction, no dilation, incomplete EGD due to retained food in stomach. GES thereafter with delayed gastric emptying.   Marland Kitchen RIGHT HEART CATH N/A 09/27/2017   Procedure: RIGHT HEART CATH;  Surgeon: Jolaine Artist, MD;  Location: [place] CV LAB;  Service: Cardiovascular;  Laterality: N/A;  . RIGHT HEART CATH N/A 09/05/2019   Procedure: RIGHT HEART CATH;  Surgeon: Jolaine Artist, MD;  Location: [place] [facility];  Service: Cardiovascular;  Laterality: N/A;  . UTERINE FIBROID SURGERY       OB History   No obstetric history on file.     Family History  Problem Relation Age of Onset  . Hypertension Father   . Hypertension Sister   . Hypertension Brother   . Diabetes Maternal Grandfather   . Diabetes Maternal Uncle   . Diabetes Mother   . Colon cancer Neg Hx     Social History   Tobacco Use  . Smoking status: Never Smoker  . Smokeless tobacco: Never Used  Vaping Use  . Vaping Use: Former  Substance Use Topics  . Alcohol use: No  . Drug use: No    Home Medications Prior to Admission  medications   Medication Sig Start Date End Date Taking? Authorizing Provider  azelastine (OPTIVAR) 0.05 % ophthalmic solution  09/29/19   [provider]  bisoprolol (ZEBETA) 5 MG tablet Take 1 tablet (5 mg total) by mouth daily. 01/16/20   Bensimhon, Shaune Pascal, MD  Cholecalciferol (VITAMIN D-3) 5000 units TABS Take 10,000 Units by mouth daily.     [provider]  esomeprazole (NEXIUM) 40 MG capsule TAKE 1 CAPSULE BY MOUTH TWO TIMES DAILY 08/21/19   Mahala Menghini, PA-C  furosemide (LASIX) 40 MG tablet Take 40 mg by mouth as needed. Except Saturdays and Sundays     [provider]  macitentan (OPSUMIT)  10 MG tablet Take 1 tablet (10 mg total) by mouth daily. 09/11/19   Bensimhon, Shaune Pascal, MD  Multiple Vitamins-Minerals (MEGA MULTIVITAMIN) POWD Take 1 Scoop by mouth daily. Mix 1 scoop in water    [provider]  mycophenolate (CELLCEPT) 500 MG tablet TAKE 3 TABLETS BY MOUTH IN THE MORNING AND 3 TABLETS IN THE EVENING 11/20/19   Mannam, Praveen, MD  Nintedanib (OFEV) 150 MG CAPS Take 1 capsule (150 mg total) by mouth 2 (two) times daily. 11/20/19   Marshell Garfinkel, MD  NP THYROID 90 MG tablet Take 1 tablet (90 mg total) by mouth daily. 01/29/20   [name], MD  ondansetron (ZOFRAN) 4 MG tablet Take 1 tablet (4 mg total) by mouth every 8 (eight) hours as needed for nausea or vomiting. 02/11/20   [name], MD  oxyCODONE (OXY IR/ROXICODONE) 5 MG immediate release tablet Take 1 tablet (5 mg total) by mouth every 4 (four) hours as needed for severe pain. 02/11/20   Hurshel Party C, MD  potassium chloride 20 MEQ/15ML (10%) SOLN Take 20 mEq by mouth as needed. Except on saturdays and sundays     [provider]  prednisoLONE acetate (PRED FORTE) 1 % ophthalmic suspension as needed.  09/18/19   [provider]  spironolactone (ALDACTONE) 25 MG tablet TAKE 1 TABLET BY MOUTH DAILY. 11/20/19   Bensimhon, Shaune Pascal, MD  sulfamethoxazole-trimethoprim (BACTRIM DS) 800-160 MG tablet Take 1 tablet by mouth daily. 11/20/19   Marshell Garfinkel, MD  thyroid (NP THYROID) 60 MG tablet Take 1 tablet (60 mg total) by mouth daily before breakfast. 09/15/19   [name], MD    Allergies    Lisinopril  Review of Systems   Review of Systems  Gastrointestinal: Positive for abdominal pain and nausea.  All other systems reviewed and are negative.   Physical Exam Updated Vital Signs BP (!) 137/94   Pulse (!) 109   Temp 98.2 F (36.8 C)   Resp 18   Ht 5\' 5"  (1.651 m)   Wt 59 kg   SpO2 99%   BMI 21.64 kg/m   Physical Exam Vitals and nursing note reviewed.    Constitutional:      Appearance: She is well-developed.  HENT:     Head: Normocephalic and atraumatic.     Mouth/Throat:     Mouth: Mucous membranes are moist.     Pharynx: Oropharynx is clear.  Eyes:     Extraocular Movements: Extraocular movements intact.     Pupils: Pupils are equal, round, and reactive to light.  Cardiovascular:     Rate and Rhythm: Normal rate and regular rhythm.  Pulmonary:     Effort: Pulmonary effort is normal.     Breath sounds: Normal breath sounds.  Abdominal:  General: Abdomen is flat. Bowel sounds are decreased.     Palpations: Abdomen is soft.     Tenderness: There is generalized abdominal tenderness.  Skin:    General: Skin is warm.     Capillary Refill: Capillary refill takes less than 2 seconds.  Neurological:     General: No focal deficit present.     Mental Status: She is alert and oriented to person, place, and time.  Psychiatric:        Mood and Affect: Mood normal.        Behavior: Behavior normal.     ED Results / Procedures / Treatments   Labs (all labs ordered are listed, but only abnormal results are displayed) Labs Reviewed  CBC WITH DIFFERENTIAL/PLATELET - Abnormal; Notable for the following components:      Result Value   Hemoglobin 11.4 (*)    All other components within normal limits  COMPREHENSIVE METABOLIC PANEL - Abnormal; Notable for the following components:   Potassium 3.3 (*)    Glucose, Bld 114 (*)    All other components within normal limits  URINALYSIS, ROUTINE W REFLEX MICROSCOPIC - Abnormal; Notable for the following components:   Hgb urine dipstick LARGE (*)    Protein, ur 100 (*)    Leukocytes,Ua TRACE (*)    Bacteria, UA RARE (*)    All other components within normal limits  LIPASE, BLOOD    EKG None  Radiology No results found.  Procedures Procedures (including critical care time)  Medications Ordered in ED Medications  morphine 4 MG/ML injection 4 mg (4 mg Intravenous Given 02/12/20  0226)  ondansetron (ZOFRAN) injection 4 mg (4 mg Intravenous Given 02/12/20 0225)  sodium chloride 0.9 % bolus 1,000 mL (0 mLs Intravenous Stopped 02/12/20 0420)    ED Course  I have reviewed the triage vital signs and the nursing notes.  Pertinent labs & imaging results that were available during my care of the patient were reviewed by me and considered in my medical decision making (see chart for details).    MDM Rules/Calculators/A&P                          [place] is not here overnight, so patient will be kept until [place] arrives for day shift.  Pt's pain and nausea have improved.  Pt will be signed out to Dr. Roderic Palau at shift change.  Final Clinical Impression(s) / ED Diagnoses Final diagnoses:  Abdominal pain  Nausea    Rx / DC Orders ED Discharge Orders    None       Isla Pence, MD 02/12/20 [id number]

## 2020-02-12 NOTE — ED Triage Notes (Signed)
[facility] - pt c/o abdominal pain x 3 days, was recently seen here for the same. Pt has an [place] scheduled for today. States she saw a small amount of hematuria tonight. Last dose of morphine 1900 yesterday.

## 2020-02-13 ENCOUNTER — Inpatient Hospital Stay (HOSPITAL_COMMUNITY): Payer: 59 | Admitting: Certified Registered"

## 2020-02-13 ENCOUNTER — Encounter (HOSPITAL_COMMUNITY): Payer: Self-pay | Admitting: Family Medicine

## 2020-02-13 ENCOUNTER — Inpatient Hospital Stay (HOSPITAL_COMMUNITY): Payer: 59

## 2020-02-13 ENCOUNTER — Encounter (HOSPITAL_COMMUNITY)
Admission: EM | Disposition: A | Discharge status: Home / Self Care | Payer: Self-pay | Source: Home / Self Care | Attending: Family Medicine

## 2020-02-13 ENCOUNTER — Other Ambulatory Visit (HOSPITAL_COMMUNITY): Payer: 59

## 2020-02-13 DIAGNOSIS — J9601 Acute respiratory failure with hypoxia: Secondary | ICD-10-CM

## 2020-02-13 DIAGNOSIS — J9602 Acute respiratory failure with hypercapnia: Secondary | ICD-10-CM

## 2020-02-13 DIAGNOSIS — Z9049 Acquired absence of other specified parts of digestive tract: Secondary | ICD-10-CM

## 2020-02-13 DIAGNOSIS — J811 Chronic pulmonary edema: Secondary | ICD-10-CM

## 2020-02-13 DIAGNOSIS — K5909 Other constipation: Secondary | ICD-10-CM | POA: Diagnosis not present

## 2020-02-13 DIAGNOSIS — K8000 Calculus of gallbladder with acute cholecystitis without obstruction: Secondary | ICD-10-CM | POA: Diagnosis not present

## 2020-02-13 DIAGNOSIS — J810 Acute pulmonary edema: Secondary | ICD-10-CM | POA: Diagnosis not present

## 2020-02-13 DIAGNOSIS — K810 Acute cholecystitis: Secondary | ICD-10-CM | POA: Diagnosis not present

## 2020-02-13 DIAGNOSIS — K219 Gastro-esophageal reflux disease without esophagitis: Secondary | ICD-10-CM | POA: Diagnosis not present

## 2020-02-13 DIAGNOSIS — M349 Systemic sclerosis, unspecified: Secondary | ICD-10-CM

## 2020-02-13 DIAGNOSIS — M3483 Systemic sclerosis with polyneuropathy: Secondary | ICD-10-CM | POA: Diagnosis not present

## 2020-02-13 DIAGNOSIS — Z8249 Family history of ischemic heart disease and other diseases of the circulatory system: Secondary | ICD-10-CM | POA: Diagnosis not present

## 2020-02-13 DIAGNOSIS — Z888 Allergy status to other drugs, medicaments and biological substances status: Secondary | ICD-10-CM | POA: Diagnosis not present

## 2020-02-13 DIAGNOSIS — Z23 Encounter for immunization: Secondary | ICD-10-CM | POA: Diagnosis not present

## 2020-02-13 HISTORY — DX: Acute respiratory failure with hypoxia: J96.01

## 2020-02-13 HISTORY — PX: CHOLECYSTECTOMY: SHX55

## 2020-02-13 HISTORY — DX: Acquired absence of other specified parts of digestive tract: Z90.49

## 2020-02-13 LAB — BLOOD GAS, ARTERIAL
Acid-Base Excess: 2.2 mmol/L — ABNORMAL HIGH (ref 0.0–2.0)
Acid-base deficit: 4.8 mmol/L — ABNORMAL HIGH (ref 0.0–2.0)
Bicarbonate: 17.9 mmol/L — ABNORMAL LOW (ref 20.0–28.0)
Bicarbonate: 26.4 mmol/L (ref 20.0–28.0)
FIO2: 100
FIO2: 90
O2 Saturation: 95.1 %
O2 Saturation: 100 %
Patient temperature: 37
Patient temperature: 37.8
pCO2 arterial: 94.1 mmHg (ref 32.0–48.0)
pCO2 arterial: 42.1 mmHg (ref 32.0–48.0)
pH, Arterial: 7.048 — CL (ref 7.350–7.450)
pH, Arterial: 7.415 (ref 7.350–7.450)
pO2, Arterial: 115 mmHg — ABNORMAL HIGH (ref 83.0–108.0)
pO2, Arterial: 360 mmHg — ABNORMAL HIGH (ref 83.0–108.0)

## 2020-02-13 LAB — COMPREHENSIVE METABOLIC PANEL
ALT: 43 U/L (ref 0–44)
AST: 61 U/L — ABNORMAL HIGH (ref 15–41)
Albumin: 3.2 g/dL — ABNORMAL LOW (ref 3.5–5.0)
Alkaline Phosphatase: 57 U/L (ref 38–126)
Anion gap: 11 (ref 5–15)
BUN: 5 mg/dL — ABNORMAL LOW (ref 6–20)
CO2: 23 mmol/L (ref 22–32)
Calcium: 9 mg/dL (ref 8.9–10.3)
Chloride: 103 mmol/L (ref 98–111)
Creatinine, Ser: 0.66 mg/dL (ref 0.44–1.00)
GFR, Estimated: >60 mL/min (ref >60)
Glucose, Bld: 247 mg/dL — ABNORMAL HIGH (ref 70–99)
Potassium: 3.6 mmol/L (ref 3.5–5.1)
Sodium: 137 mmol/L (ref 135–145)
Total Bilirubin: 0.3 mg/dL (ref 0.3–1.2)
Total Protein: 7.3 g/dL (ref 6.5–8.1)

## 2020-02-13 LAB — ECHOCARDIOGRAM COMPLETE
Area-P 1/2: 7.16 cm2
Height: 65 in
S' Lateral: 2.17 cm
Weight: 2081.14 oz

## 2020-02-13 LAB — CBC
HCT: 41.4 % (ref 36.0–46.0)
Hemoglobin: 12.8 g/dL (ref 12.0–15.0)
MCH: 27.1 pg (ref 26.0–34.0)
MCHC: 30.9 g/dL (ref 30.0–36.0)
MCV: 87.5 fL (ref 80.0–100.0)
Platelets: 258 10*3/uL (ref 150–400)
RBC: 4.73 MIL/uL (ref 3.87–5.11)
RDW: 13.2 % (ref 11.5–15.5)
WBC: 8.2 10*3/uL (ref 4.0–10.5)
nRBC: 0 % (ref 0.0–0.2)

## 2020-02-13 LAB — URINE CULTURE: Culture: <10000 — AB

## 2020-02-13 LAB — D-DIMER, QUANTITATIVE: D-Dimer, Quant: 8.03 ug/mL-FEU — ABNORMAL HIGH (ref 0.00–0.50)

## 2020-02-13 LAB — GLUCOSE, CAPILLARY: Glucose-Capillary: 186 mg/dL — ABNORMAL HIGH (ref 70–99)

## 2020-02-13 LAB — CK TOTAL AND CKMB (NOT AT ARMC)

## 2020-02-13 SURGERY — LAPAROSCOPIC CHOLECYSTECTOMY
Anesthesia: General

## 2020-02-13 MED ORDER — ROCURONIUM BROMIDE 100 MG/10ML IV SOLN
INTRAVENOUS | Status: DC | PRN
  Administered 2020-02-13: 10 mg via INTRAVENOUS
  Administered 2020-02-13: 40 mg via INTRAVENOUS

## 2020-02-13 MED ORDER — ESMOLOL HCL 100 MG/10ML IV SOLN
INTRAVENOUS | Status: DC | PRN
  Administered 2020-02-13 (×2): 20 mg via INTRAVENOUS

## 2020-02-13 MED ORDER — FENTANYL CITRATE (PF) 250 MCG/5ML IJ SOLN
INTRAMUSCULAR | Status: AC
  Filled 2020-02-13: qty 5

## 2020-02-13 MED ORDER — FUROSEMIDE 10 MG/ML IJ SOLN: 40.0000 mg | Freq: Once | INTRAMUSCULAR | Status: DC

## 2020-02-13 MED ORDER — DOCUSATE SODIUM 50 MG/5ML PO LIQD
100.0000 mg | Freq: Two times a day (BID) | ORAL | Status: DC
  Administered 2020-02-13 – 2020-02-14 (×2): 100 mg via ORAL
  Filled 2020-02-13 (×2): qty 10

## 2020-02-13 MED ORDER — POLYETHYLENE GLYCOL 3350 17 G PO PACK: 17.0000 g | PACK | Freq: Every day | ORAL | Status: DC

## 2020-02-13 MED ORDER — PROPOFOL 1000 MG/100ML IV EMUL
5.0000 ug/kg/min | INTRAVENOUS | Status: DC
  Administered 2020-02-13 – 2020-02-14 (×3): 60 ug/kg/min via INTRAVENOUS
  Administered 2020-02-14: 40 ug/kg/min via INTRAVENOUS
  Filled 2020-02-13 (×4): qty 100

## 2020-02-13 MED ORDER — DEXAMETHASONE SODIUM PHOSPHATE 10 MG/ML IJ SOLN
INTRAMUSCULAR | Status: DC | PRN
  Administered 2020-02-13: 10 mg via INTRAVENOUS

## 2020-02-13 MED ORDER — LIDOCAINE HCL (CARDIAC) PF 50 MG/5ML IV SOSY
PREFILLED_SYRINGE | INTRAVENOUS | Status: DC | PRN
  Administered 2020-02-13: 100 mg via INTRAVENOUS

## 2020-02-13 MED ORDER — DEXAMETHASONE SODIUM PHOSPHATE 10 MG/ML IJ SOLN
INTRAMUSCULAR | Status: AC
  Filled 2020-02-13: qty 1

## 2020-02-13 MED ORDER — LIDOCAINE 2% (20 MG/ML) 5 ML SYRINGE
INTRAMUSCULAR | Status: AC
  Filled 2020-02-13: qty 5

## 2020-02-13 MED ORDER — THYROID 30 MG PO TABS
90.0000 mg | ORAL_TABLET | Freq: Every day | ORAL | Status: DC
  Administered 2020-02-14 – 2020-02-15 (×2): 90 mg via ORAL
  Filled 2020-02-13 (×3): qty 3

## 2020-02-13 MED ORDER — CHLORHEXIDINE GLUCONATE 0.12 % MT SOLN
15.0000 mL | Freq: Once | OROMUCOSAL | Status: AC
  Administered 2020-02-13: 15 mL via OROMUCOSAL

## 2020-02-13 MED ORDER — CHLORHEXIDINE GLUCONATE CLOTH 2 % EX PADS
6.0000 | MEDICATED_PAD | Freq: Every day | CUTANEOUS | Status: DC
  Administered 2020-02-13 – 2020-02-15 (×3): 6 via TOPICAL

## 2020-02-13 MED ORDER — FENTANYL CITRATE (PF) 100 MCG/2ML IJ SOLN
25.0000 ug | INTRAMUSCULAR | Status: DC | PRN
  Administered 2020-02-13: 50 ug via INTRAVENOUS
  Filled 2020-02-13: qty 2

## 2020-02-13 MED ORDER — ONDANSETRON HCL 4 MG/2ML IJ SOLN
INTRAMUSCULAR | Status: DC | PRN
  Administered 2020-02-13: 4 mg via INTRAVENOUS

## 2020-02-13 MED ORDER — FUROSEMIDE 10 MG/ML IJ SOLN
INTRAMUSCULAR | Status: AC
  Filled 2020-02-13: qty 4

## 2020-02-13 MED ORDER — FLUORESCEIN SODIUM 10 % IV SOLN
INTRAVENOUS | Status: DC | PRN
  Administered 2020-02-13: 40 mg via INTRAVENOUS

## 2020-02-13 MED ORDER — PHENYLEPHRINE HCL (PRESSORS) 10 MG/ML IV SOLN
INTRAVENOUS | Status: DC | PRN
  Administered 2020-02-13: 80 ug via INTRAVENOUS

## 2020-02-13 MED ORDER — FUROSEMIDE 10 MG/ML IJ SOLN: INTRAMUSCULAR | Status: DC | PRN

## 2020-02-13 MED ORDER — ACETAMINOPHEN 160 MG/5ML PO SOLN
325.0000 mg | Freq: Four times a day (QID) | ORAL | Status: DC
  Administered 2020-02-13 – 2020-02-15 (×5): 325 mg
  Filled 2020-02-13 (×6): qty 20.3

## 2020-02-13 MED ORDER — PROPOFOL 10 MG/ML IV BOLUS
INTRAVENOUS | Status: AC
  Filled 2020-02-13: qty 20

## 2020-02-13 MED ORDER — FENTANYL CITRATE (PF) 100 MCG/2ML IJ SOLN
INTRAMUSCULAR | Status: DC | PRN
  Administered 2020-02-13 (×3): 50 ug via INTRAVENOUS
  Administered 2020-02-13: 100 ug via INTRAVENOUS

## 2020-02-13 MED ORDER — HEMOSTATIC AGENTS (NO CHARGE) OPTIME
TOPICAL | Status: DC | PRN
  Administered 2020-02-13 (×3): 1 via TOPICAL

## 2020-02-13 MED ORDER — FENTANYL CITRATE (PF) 100 MCG/2ML IJ SOLN: 50.0000 ug | INTRAMUSCULAR | Status: DC | PRN

## 2020-02-13 MED ORDER — BUPIVACAINE LIPOSOME 1.3 % IJ SUSP
INTRAMUSCULAR | Status: AC
  Filled 2020-02-13: qty 20

## 2020-02-13 MED ORDER — ORAL CARE MOUTH RINSE: 15.0000 mL | Freq: Once | OROMUCOSAL | Status: AC

## 2020-02-13 MED ORDER — MIDAZOLAM HCL 2 MG/2ML IJ SOLN
INTRAMUSCULAR | Status: DC | PRN
  Administered 2020-02-13: 2 mg via INTRAVENOUS

## 2020-02-13 MED ORDER — SUCCINYLCHOLINE CHLORIDE 200 MG/10ML IV SOSY
PREFILLED_SYRINGE | INTRAVENOUS | Status: AC
  Filled 2020-02-13: qty 10

## 2020-02-13 MED ORDER — SODIUM CHLORIDE 0.9 % IV SOLN
INTRAVENOUS | Status: AC
  Filled 2020-02-13: qty 20

## 2020-02-13 MED ORDER — ORAL CARE MOUTH RINSE
15.0000 mL | OROMUCOSAL | Status: DC
  Administered 2020-02-13 – 2020-02-14 (×8): 15 mL via OROMUCOSAL

## 2020-02-13 MED ORDER — POTASSIUM CHLORIDE 10 MEQ/100ML IV SOLN
10.0000 meq | INTRAVENOUS | Status: AC
  Administered 2020-02-13 (×3): 10 meq via INTRAVENOUS
  Filled 2020-02-13 (×3): qty 100

## 2020-02-13 MED ORDER — BUPIVACAINE LIPOSOME 1.3 % IJ SUSP
INTRAMUSCULAR | Status: DC | PRN
  Administered 2020-02-13: 20 mL

## 2020-02-13 MED ORDER — MIDAZOLAM HCL 2 MG/2ML IJ SOLN
INTRAMUSCULAR | Status: AC
  Filled 2020-02-13: qty 2

## 2020-02-13 MED ORDER — CHLORHEXIDINE GLUCONATE 0.12% ORAL RINSE (MEDLINE KIT)
15.0000 mL | Freq: Two times a day (BID) | OROMUCOSAL | Status: DC
  Administered 2020-02-13 – 2020-02-14 (×2): 15 mL via OROMUCOSAL

## 2020-02-13 MED ORDER — POVIDONE-IODINE 10 % OINT PACKET
TOPICAL_OINTMENT | CUTANEOUS | Status: DC | PRN
  Administered 2020-02-13: 1 via TOPICAL

## 2020-02-13 MED ORDER — LACTATED RINGERS IV SOLN: INTRAVENOUS | Status: DC

## 2020-02-13 MED ORDER — PROPOFOL 500 MG/50ML IV EMUL
5.0000 ug/kg/min | INTRAVENOUS | Status: DC
  Administered 2020-02-13: 35 ug/kg/min via INTRAVENOUS
  Filled 2020-02-13: qty 50

## 2020-02-13 MED ORDER — FUROSEMIDE 10 MG/ML IJ SOLN
40.0000 mg | Freq: Once | INTRAMUSCULAR | Status: DC
  Filled 2020-02-13: qty 4

## 2020-02-13 MED ORDER — LABETALOL HCL 5 MG/ML IV SOLN
INTRAVENOUS | Status: AC
  Filled 2020-02-13: qty 4

## 2020-02-13 MED ORDER — SUCCINYLCHOLINE CHLORIDE 20 MG/ML IJ SOLN
INTRAMUSCULAR | Status: DC | PRN
  Administered 2020-02-13: 140 mg via INTRAVENOUS

## 2020-02-13 MED ORDER — LABETALOL HCL 5 MG/ML IV SOLN
20.0000 mg | INTRAVENOUS | Status: DC | PRN
  Administered 2020-02-14: 20 mg via INTRAVENOUS
  Filled 2020-02-13: qty 4

## 2020-02-13 MED ORDER — PROPOFOL 10 MG/ML IV BOLUS
INTRAVENOUS | Status: DC | PRN
  Administered 2020-02-13: 50 mg via INTRAVENOUS
  Administered 2020-02-13: 150 mg via INTRAVENOUS
  Administered 2020-02-13 (×2): 50 mg via INTRAVENOUS

## 2020-02-13 MED ORDER — POVIDONE-IODINE 10 % EX OINT
TOPICAL_OINTMENT | CUTANEOUS | Status: AC
  Filled 2020-02-13: qty 1

## 2020-02-13 MED ORDER — SUGAMMADEX SODIUM 500 MG/5ML IV SOLN
INTRAVENOUS | Status: DC | PRN
  Administered 2020-02-13: 400 mg via INTRAVENOUS

## 2020-02-13 MED ORDER — ONDANSETRON HCL 4 MG/2ML IJ SOLN
INTRAMUSCULAR | Status: AC
  Filled 2020-02-13: qty 2

## 2020-02-13 MED ORDER — FENTANYL 2500MCG IN NS 250ML (10MCG/ML) PREMIX INFUSION
0.0000 ug/h | INTRAVENOUS | Status: DC
  Administered 2020-02-13: 25 ug/h via INTRAVENOUS
  Filled 2020-02-13: qty 250

## 2020-02-13 MED ORDER — FUROSEMIDE 10 MG/ML IJ SOLN
40.0000 mg | Freq: Once | INTRAMUSCULAR | Status: AC
  Administered 2020-02-13: 40 mg via INTRAVENOUS

## 2020-02-13 MED ORDER — ESMOLOL HCL 100 MG/10ML IV SOLN
INTRAVENOUS | Status: AC
  Filled 2020-02-13: qty 10

## 2020-02-13 MED ORDER — FENTANYL CITRATE (PF) 100 MCG/2ML IJ SOLN
50.0000 ug | INTRAMUSCULAR | Status: DC | PRN
  Administered 2020-02-13 – 2020-02-14 (×2): 50 ug via INTRAVENOUS

## 2020-02-13 MED ORDER — ONDANSETRON HCL 4 MG/2ML IJ SOLN
4.0000 mg | Freq: Once | INTRAMUSCULAR | Status: AC | PRN
  Administered 2020-02-13: 4 mg via INTRAVENOUS
  Filled 2020-02-13: qty 2

## 2020-02-13 MED ORDER — LABETALOL HCL 5 MG/ML IV SOLN
INTRAVENOUS | Status: DC | PRN
  Administered 2020-02-13: 10 mg via INTRAVENOUS

## 2020-02-13 MED ORDER — SODIUM CHLORIDE 0.9 % IR SOLN
Status: DC | PRN
  Administered 2020-02-13: 1000 mL

## 2020-02-13 SURGICAL SUPPLY — 51 items
APPLICATOR ARISTA FLEXITIP XL (MISCELLANEOUS) ×1 IMPLANT
APPLIER CLIP ROT 10 11.4 M/L (STAPLE) ×2
BAG RETRIEVAL 10 (BASKET) ×1
CLIP APPLIE ROT 10 11.4 M/L (STAPLE) ×1 IMPLANT
CLOTH BEACON ORANGE TIMEOUT ST (SAFETY) ×2 IMPLANT
COVER LIGHT HANDLE STERIS (MISCELLANEOUS) ×4 IMPLANT
COVER WAND RF STERILE (DRAPES) ×2 IMPLANT
DURAPREP 26ML APPLICATOR (WOUND CARE) ×2 IMPLANT
ELECT REM PT RETURN 9FT ADLT (ELECTROSURGICAL) ×2
ELECTRODE REM PT RTRN 9FT ADLT (ELECTROSURGICAL) ×1 IMPLANT
GAUZE SPONGE 4X4 12PLY STRL (GAUZE/BANDAGES/DRESSINGS) ×1 IMPLANT
GLOVE BIO SURGEON STRL SZ 6.5 (GLOVE) ×1 IMPLANT
GLOVE BIOGEL M 7.0 STRL (GLOVE) ×2 IMPLANT
GLOVE BIOGEL PI IND STRL 6.5 (GLOVE) IMPLANT
GLOVE BIOGEL PI IND STRL 7.0 (GLOVE) ×2 IMPLANT
GLOVE BIOGEL PI INDICATOR 6.5 (GLOVE) ×1
GLOVE BIOGEL PI INDICATOR 7.0 (GLOVE) ×3
GLOVE SURG SS PI 7.5 STRL IVOR (GLOVE) ×3 IMPLANT
GOWN STRL REUS W/ TWL XL LVL3 (GOWN DISPOSABLE) IMPLANT
GOWN STRL REUS W/TWL LRG LVL3 (GOWN DISPOSABLE) ×6 IMPLANT
GOWN STRL REUS W/TWL XL LVL3 (GOWN DISPOSABLE) ×1
HEMOSTAT ARISTA ABSORB 3G PWDR (HEMOSTASIS) ×1 IMPLANT
HEMOSTAT SNOW SURGICEL 2X4 (HEMOSTASIS) ×3 IMPLANT
INST SET LAPROSCOPIC AP (KITS) ×2 IMPLANT
KIT TURNOVER KIT A (KITS) ×2 IMPLANT
MANIFOLD NEPTUNE II (INSTRUMENTS) ×2 IMPLANT
NDL HYPO 18GX1.5 BLUNT FILL (NEEDLE) ×1 IMPLANT
NDL HYPO 21X1.5 SAFETY (NEEDLE) ×1 IMPLANT
NDL INSUFFLATION 14GA 120MM (NEEDLE) ×1 IMPLANT
NEEDLE HYPO 18GX1.5 BLUNT FILL (NEEDLE) ×2 IMPLANT
NEEDLE HYPO 21X1.5 SAFETY (NEEDLE) ×2 IMPLANT
NEEDLE INSUFFLATION 14GA 120MM (NEEDLE) ×2 IMPLANT
NS IRRIG 1000ML POUR BTL (IV SOLUTION) ×2 IMPLANT
PACK LAP CHOLE LZT030E (CUSTOM PROCEDURE TRAY) ×1 IMPLANT
PAD ARMBOARD 7.5X6 YLW CONV (MISCELLANEOUS) ×2 IMPLANT
PENCIL HANDSWITCHING (ELECTRODE) ×1 IMPLANT
SET BASIN LINEN APH (SET/KITS/TRAYS/PACK) ×2 IMPLANT
SET TUBE SMOKE EVAC HIGH FLOW (TUBING) ×2 IMPLANT
SLEEVE ENDOPATH XCEL 5M (ENDOMECHANICALS) ×2 IMPLANT
STAPLER VISISTAT (STAPLE) ×1 IMPLANT
SUT MNCRL AB 4-0 PS2 18 (SUTURE) ×2 IMPLANT
SUT VICRYL 0 UR6 27IN ABS (SUTURE) ×3 IMPLANT
SYR 20ML LL LF (SYRINGE) ×4 IMPLANT
SYS BAG RETRIEVAL 10MM (BASKET) ×1
SYSTEM BAG RETRIEVAL 10MM (BASKET) ×1 IMPLANT
TAPE CLOTH SURG 4X10 WHT LF (GAUZE/BANDAGES/DRESSINGS) ×1 IMPLANT
TROCAR ENDO BLADELESS 11MM (ENDOMECHANICALS) ×2 IMPLANT
TROCAR XCEL NON-BLD 5MMX100MML (ENDOMECHANICALS) ×2 IMPLANT
TROCAR XCEL UNIV SLVE 11M 100M (ENDOMECHANICALS) ×2 IMPLANT
TUBE CONNECTING 12X1/4 (SUCTIONS) ×2 IMPLANT
WARMER LAPAROSCOPE (MISCELLANEOUS) ×2 IMPLANT

## 2020-02-13 NOTE — Op Note (Signed)
Patient:  Karen [name]  DOB:  April 09, 1971  MRN:  [id number]   Preop Diagnosis: Acute cholecystitis, cholelithiasis  Postop Diagnosis: Same  Procedure: Laparoscopic cholecystectomy  Surgeon: Aviva Signs, MD  Assistant: Curlene Labrum, MD  Anes: General endotracheal  Indications: Patient is a 49 year old black female who presents with acute cholecystitis secondary to cholelithiasis.  The risks and benefits of the procedure including bleeding, infection, hepatobiliary injury, and the possibility of an open procedure were fully explained to the patient, who gave informed consent.  Procedure note: The patient was placed in the supine position.  After induction of general endotracheal anesthesia, the abdomen was prepped and draped using the usual sterile technique with ChloraPrep.  Surgical site confirmation was performed.  A supraumbilical incision was made down to the fascia.  A Veress needle was introduced into the abdominal cavity and confirmation of placement was done using saline drop test.  The abdomen was then insufflated to 15 mmHg pressure.  An 11 mm trocar was introduced into the abdominal cavity under direct visualization without difficulty.  The patient was placed in reverse Trendelenburg position and an additional 11 mm trocar was placed in the epigastric region and 5 mm trochars were placed in the right upper quadrant and right flank regions.  The bowel was inspected and there was no injury from trocar placement.  The gallbladder was noted to be very large with large gallstones present.  The gallbladder was retracted in a dynamic fashion in order to provide a critical view of the triangle of Calot.  Multiple adhesions of small bowel and colon were freed away from the gallbladder.  The cystic duct was first identified.  Its juncture to the infundibulum was fully identified.  Endoclips placed proximally and distally on the cystic duct, and the cystic duct was divided.  This was  likewise done to the cystic artery.  The gallbladder was freed away from the gallbladder fossa using Bovie electrocautery.  The gallbladder was delivered through the epigastric trocar site using an Endo Catch bag.  The gallbladder fossa was inspected and any bleeding was controlled using Bovie electrocautery.  Arista and Surgicel snow were placed in the gallbladder fossa.  All fluid and air were then evacuated from the abdominal cavity prior to removal of the trochars.  All wounds were irrigated with normal saline.  All wounds were injected with Exparel.  The epigastric fascia was reapproximated using 0 Vicryl interrupted sutures.  All skin incisions were closed using staples.  Betadine ointment and dry sterile dressings were applied.  All tape needle counts were correct at the end of the procedure.  The patient was extubated in the operating room and transferred to PACU in stable condition.  Complications: None  EBL: Minimal  Specimen: Gallbladder

## 2020-02-13 NOTE — Progress Notes (Deleted)
At 1257 patient was resting comfortably in the bed and all of a sudden she sat up and said she did not feel right and started frothing out the mouth and nose. Anesthesia was at bedside at the time

## 2020-02-13 NOTE — Anesthesia Postprocedure Evaluation (Signed)
Anesthesia Post Note  Patient: Karen [name]  Procedure(s) Performed: LAPAROSCOPIC CHOLECYSTECTOMY (N/A )  Anesthesia Type: General Anesthetic complications: no   No complications documented.   Last Vitals:  Vitals:   02/13/20 1345 02/13/20 1415  BP: (!) 118/95 (!) 112/96  Pulse:    Resp:  (!) 22  Temp:    SpO2:      Last Pain:  Vitals:   02/13/20 1215  TempSrc:   PainSc: 7        Called to PACU at 12:39, arrived at 12:41.  Pt was about to be discharged from PACU when she sat up and compliained that she couldn't breathe.  Began expellling frothy liquid from her mouth and nose.  Obtunded.  Pt, reintubated, A-line palced, CVP placed, CXR done x2 (before and after ETT), labs sent, bedside echo.  Pt stabilized.  Diuresed successfully with 40mg  lasix,  Report given to hospitalist, transfer intubated to ICU.          Louann Sjogren

## 2020-02-13 NOTE — Anesthesia Preprocedure Evaluation (Signed)
Anesthesia Evaluation  Patient identified by MRN, date of birth, ID band Patient awake    Reviewed: Allergy & Precautions, H&P , NPO status , Patient's Chart, lab work & pertinent test results, reviewed documented beta blocker date and time   Airway Mallampati: I  TM Distance: >3 FB Neck ROM: full    Dental no notable dental hx. (+) Teeth Intact   Pulmonary neg pulmonary ROS,    Pulmonary exam normal breath sounds clear to auscultation       Cardiovascular Exercise Tolerance: Good hypertension, negative cardio ROS   Rhythm:regular Rate:Normal     Neuro/Psych negative neurological ROS  negative psych ROS   GI/Hepatic Neg liver ROS, GERD  Medicated,  Endo/Other  Hypothyroidism   Renal/GU negative Renal ROS  negative genitourinary   Musculoskeletal   Abdominal   Peds  Hematology negative hematology ROS (+)   Anesthesia Other Findings   Reproductive/Obstetrics negative OB ROS                             Anesthesia Physical Anesthesia Plan  ASA: III  Anesthesia Plan: General   Post-op Pain Management:    Induction:   PONV Risk Score and Plan: Ondansetron  Airway Management Planned:   Additional Equipment:   Intra-op Plan:   Post-operative Plan:   Informed Consent: I have reviewed the patients History and Physical, chart, labs and discussed the procedure including the risks, benefits and alternatives for the proposed anesthesia with the patient or authorized representative who has indicated his/her understanding and acceptance.     Dental Advisory Given  Plan Discussed with: CRNA  Anesthesia Plan Comments:         Anesthesia Quick Evaluation

## 2020-02-13 NOTE — Progress Notes (Signed)
At 1237 patient sat straight up in the bed and stated that she did not feel right and she started having frothy discharge coming from her mouth and nose. Tressie Stalker from anesthesia was present at bedside and suction was started and ambubag was started. Dr Adalberto Ill was called and he instructed anesthesia to reintubate patient. Dr. Arnoldo Morale was called and also Dr. Joesph Fillers. Patient had foley placed , right femoral line, NG tube and left art line was all started on patient. Patient was started on propofol drip to keep her sedated. Patient will be transferred to ICU.

## 2020-02-13 NOTE — Transfer of Care (Signed)
Immediate Anesthesia Transfer of Care Note  Patient: Karen [name]  Procedure(s) Performed: LAPAROSCOPIC CHOLECYSTECTOMY (N/A )  Patient Location: PACU  Anesthesia Type:General  Level of Consciousness: awake, alert , drowsy and patient cooperative  Airway & Oxygen Therapy: Patient Spontanous Breathing and Patient connected to face mask oxygen  Post-op Assessment: Report given to RN, Post -op Vital signs reviewed and stable and Patient moving all extremities  Post vital signs: Reviewed and stable  Last Vitals:  Vitals Value Taken Time  BP 181/120 02/13/20 1132  Temp    Pulse    Resp 14 02/13/20 1136  SpO2    Vitals shown include unvalidated device data.  Last Pain:  Vitals:   02/13/20 0904  TempSrc: Oral  PainSc:       Patients Stated Pain Goal: 7 (14/60/47 9987)  Complications: No complications documented.

## 2020-02-13 NOTE — Progress Notes (Signed)
Patient Demographics:    Karen [name], is a 49 y.o. female, DOB - Jul 28, 1970, VQM:[id number]  Admit date - 02/12/2020   Admitting Physician Karmina Zufall Denton Brick, MD  Outpatient Primary MD for the patient is [name], MD  LOS - 1   Chief Complaint  Patient presents with  . Abdominal Pain        Subjective:    [name] today --postop patient developed shortness of breath with frothy sputum from the mouth and clinical exam and radiological studies consistent with flash pulmonary edema with hypoxic and hypercapnic respiratory failure--- patient reintubated in PACU, discussed with Dr. Arnoldo Morale general surgeon, discussed with anesthesiologist at bedside, -Pulmonary consult from Dr. Melvyn Novas requested, preliminary echo report with preserved EF and no significant wall motion abnormalities -Preoperatively patient received just over 2 L of IV fluids -Patient received Lasix 40 mg IV x1 in PACU--- 1400 mL of urine output within the first couple of hours -Patient's mother updated -Transferring to ICU  Assessment  & Plan :    Principal Problem:   Acute respiratory failure with hypoxia due to flash pulmonary edema requiring intubation Active Problems:   ILD (interstitial lung disease) ([place])   Acute calculus cholecystitis   HTN (hypertension)   Calculus of gallbladder with acute cholecystitis without obstruction   Status post laparoscopic cholecystectomy 02/13/20   Sjogren's syndrome ([place])   GERD (gastroesophageal reflux disease)   Constipation   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 female with past medical history relevant for scleroderma, Sjogren's syndrome, interstitial lung disease/PAH, GERD and HTN as well as hypothyroidism ---admitted on 02/12/2020 with Acute calculus cholecystitis, underwent lap chole on 02/13/2020 -Shortly postop patient developed shortness of  breath  with frothy sputum from the mouth and clinical exam and radiological studies consistent with flash pulmonary edema with hypoxic and hypercapnic respiratory failure---  patient reintubated in PACU, discussed with Dr. Arnoldo Morale general surgeon, discussed with anesthesiologist at bedside, -Pulmonary consult from Dr. Melvyn Novas requested, preliminary echo report with preserved EF and no significant wall motion abnormalities -Preoperatively patient received just over 2 L of IV fluids -Patient received Lasix 40 mg IV x1 in PACU--- 1400 mL of urine output within the first couple of hours -Patient's mother updated -Transferring to ICU  A/p 1) acute hypoxic and hypercapnic respiratory failure----suspect secondary to flash pulmonary edema, aspiration pneumonia cannot be excluded - with frothy sputum from the mouth  -ABG noted - patient reintubated in PACU, discussed with Dr. Arnoldo Morale general surgeon, discussed with anesthesiologist at bedside, -Pulmonary consult from Dr. Melvyn Novas requested, -- preliminary echo report with preserved EF and no significant wall motion abnormalities -Preoperatively patient received just over 2 L of IV fluids -Patient received Lasix 40 mg IV x1 in PACU--- 1400 mL of urine output within the first couple of hours -Continue IV diuresis -Continue IV Rocephin -IV propofol and Fentanyl as ordered for sedation and pain control while on vent  2)ILD/PAH--- patient follows with Dr. Vaughan Browner, continue OFEV and Opsumit -Pulmonology consult from Dr. Melvyn Novas requested  3) scleroderma/Sjogren's syndrome--- continue CellCept, continue Bactrim for prophylaxis of opportunistic infection  4)HTN-stable, continue bisoprolol 5 mg daily, IV labetalol as needed elevated BP  5) hypothyroidism---  resume levothyroxine when oral  intake resumes  6) chronic constipation--- laxatives as ordered  7) possible UTI---  continue Rocephin as above #1 pending culture results  8)Acute calculus  cholecystitis--- lap chole on 02/13/2020 -Postop management per general surgeon  CRITICAL CARE Performed by: Roxan Hockey   Total critical care time: 47 minutes  Critical care time was exclusive of separately billable procedures and treating other patients. -Acute hypoxic and hypercapnic respiratory failure requiring intubation and sedation  Critical care was necessary to treat or prevent imminent or life-threatening deterioration.  Critical care was time spent personally by me on the following activities: development of treatment plan with patient and/or surrogate as well as nursing, discussions with consultants, evaluation of patient's response to treatment, examination of patient, obtaining history from patient or surrogate, ordering and performing treatments and interventions, ordering and review of laboratory studies, ordering and review of radiographic studies, pulse oximetry and re-evaluation of patient's condition.   Disposition/Need for in-Hospital Stay- patient unable to be discharged at this time due to --- acute hypoxic and hypercapnic respiratory failure requiring intubation on mechanical ventilation and sedation, also requiring aggressive IV diuresis for pulmonary edema  Status is: Inpatient  Remains inpatient appropriate because:acute hypoxic and hypercapnic respiratory failure requiring intubation on mechanical ventilation and sedation, also requiring aggressive IV diuresis for pulmonary edema   Disposition: The patient is from: Home              Anticipated d/c is to: Home              Anticipated d/c date is: 2 days              Patient currently is not medically stable to d/c. Barriers: Not Clinically Stable- -acute hypoxic and hypercapnic respiratory failure requiring intubation on mechanical ventilation and sedation, also requiring aggressive IV diuresis for pulmonary edema  Code Status : Full code  Family Communication:  Discussed with pt's mother   Consults   :  PCCM/Gen surgery  DVT Prophylaxis  :  - SCDs   Lab Results  Component Value Date   PLT 215 02/12/2020    Inpatient Medications  Scheduled Meds: . acetaminophen (TYLENOL) oral liquid 160 mg/5 mL  325 mg Per Tube Q6H  . [MAR Hold] bisoprolol  5 mg Oral Daily  . docusate  100 mg Oral BID  . furosemide  40 mg Intravenous Once  . [MAR Hold] heparin  5,000 Units Subcutaneous Q8H  . influenza vac split quadrivalent PF  0.5 mL Intramuscular Tomorrow-1000  . [MAR Hold] macitentan  10 mg Oral Daily  . [MAR Hold] multivitamin with minerals  1 tablet Oral Daily  . [MAR Hold] mycophenolate  1,500 mg Oral BID  . [MAR Hold] Nintedanib  150 mg Oral BID  . polyethylene glycol  17 g Oral Daily  . [MAR Hold] sodium chloride flush  3 mL Intravenous Q12H  . [MAR Hold] sulfamethoxazole-trimethoprim  1 tablet Oral Daily  . [MAR Hold] thyroid  90 mg Oral Daily   Continuous Infusions: . [MAR Hold] sodium chloride    . [MAR Hold] cefTRIAXone (ROCEPHIN)  IV 0 g (02/12/20 1230)  . dextrose 5 % and 0.45% NaCl 125 mL/hr at 02/12/20 1802  . lactated ringers 10 mL/hr at 02/13/20 1010  . potassium chloride    . propofol (DIPRIVAN) infusion    . propofol 50 mcg/kg/min (02/13/20 1426)   PRN Meds:.[MAR Hold] sodium chloride, [MAR Hold] acetaminophen **OR** [MAR Hold] acetaminophen, [MAR Hold] bisacodyl, bupivacaine liposome, [MAR Hold] diphenhydrAMINE, fentaNYL (SUBLIMAZE)  injection, fentaNYL (SUBLIMAZE) injection, fentaNYL (SUBLIMAZE) injection, hemostatic agents, [MAR Hold]  HYDROmorphone (DILAUDID) injection, [MAR Hold] labetalol, [MAR Hold] ondansetron **OR** [MAR Hold] ondansetron (ZOFRAN) IV, [MAR Hold] oxyCODONE, [MAR Hold] polyethylene glycol, povidone-iodine, [MAR Hold] prednisoLONE acetate, [MAR Hold] sodium chloride flush, sodium chloride irrigation, [MAR Hold] traZODone    Anti-infectives (From admission, onward)   Start     Dose/Rate Route Frequency Ordered Stop   02/12/20 1630  [MAR Hold]   sulfamethoxazole-trimethoprim (BACTRIM DS) 800-160 MG per tablet 1 tablet        (MAR Hold since Fri 02/13/2020 at 0829.Hold Reason: Transfer to a Procedural area.)   1 tablet Oral Daily 02/12/20 1627     02/12/20 0930  [MAR Hold]  cefTRIAXone (ROCEPHIN) 2 g in sodium chloride 0.9 % 100 mL IVPB        (MAR Hold since Fri 02/13/2020 at 0829.Hold Reason: Transfer to a Procedural area.)   2 g 200 mL/hr over 30 Minutes Intravenous Every 24 hours 02/12/20 0923     02/12/20 0900  piperacillin-tazobactam (ZOSYN) IVPB 3.375 g        3.375 g 100 mL/hr over 30 Minutes Intravenous  Once 02/12/20 0859 02/12/20 0942        Objective:   Vitals:   02/13/20 1315 02/13/20 1330 02/13/20 1345 02/13/20 1415  BP: (!) 187/135 (!) 125/92 (!) 118/95 (!) 112/96  Pulse: 100 96    Resp: (!) 26 (!) 21  (!) 22  Temp:      TempSrc:      SpO2: 91% (!) 79%    Weight:      Height:        Wt Readings from Last 3 Encounters:  02/12/20 59 kg  02/09/20 59 kg  01/29/20 61.1 kg     Intake/Output Summary (Last 24 hours) at 02/13/2020 1515 Last data filed at 02/13/2020 1412 Gross per 24 hour  Intake 2511.86 ml  Output 1400 ml  Net 1111.86 ml    Physical Exam  Gen:-Intubated and sedated HEENT:- ET/OG tube Lungs-diminished breath sounds with scattered rales CV- S1, S2 normal, regular  Abd-postop wound looks clean , positive bowel sounds  extremity/Skin: pedal pulses present  Psych-affect is appropriate, oriented x3 Neuro-no new focal deficits, no tremors MSK--Right femoral catheter site is clean dry and intact, -Left upper extremity arterial line in situ GU-Foley in situ    Data Review:   Micro Results Recent Results (from the past 240 hour(s))  Respiratory Panel by RT PCR (Flu A&B, Covid) -     Status: None   Collection Time: 02/09/20 10:05 PM   Specimen: Nasopharyngeal  Result Value Ref Range Status   SARS Coronavirus 2 by RT PCR NEGATIVE NEGATIVE Final    Comment: (NOTE) SARS-CoV-2 target nucleic  acids are NOT DETECTED.  The SARS-CoV-2 RNA is generally detectable in upper respiratoy specimens during the acute phase of infection. The lowest concentration of SARS-CoV-2 viral copies this assay can detect is 131 copies/mL. A negative result does not preclude SARS-Cov-2 infection and should not be used as the sole basis for treatment or other patient management decisions. A negative result may occur with  improper specimen collection/handling, submission of specimen other than nasopharyngeal swab, presence of viral mutation(s) within the areas targeted by this assay, and inadequate number of viral copies (<131 copies/mL). A negative result must be combined with clinical observations, patient history, and epidemiological information. The expected result is Negative.  Fact Sheet for Patients:  PinkCheek.be  Fact Sheet for Healthcare  Providers:  GravelBags.it  This test is no t yet approved or cleared by the [place] and  has been authorized for detection and/or diagnosis of SARS-CoV-2 by FDA under an Emergency Use Authorization (EUA). This EUA will remain  in effect (meaning this test can be used) for the duration of the COVID-19 declaration under Section 564(b)(1) of the Act, 21 U.S.C. section 360bbb-3(b)(1), unless the authorization is terminated or revoked sooner.     Influenza A by PCR NEGATIVE NEGATIVE Final   Influenza B by PCR NEGATIVE NEGATIVE Final    Comment: (NOTE) The Xpert Xpress SARS-CoV-2/FLU/RSV assay is intended as an aid in  the diagnosis of influenza from Nasopharyngeal swab specimens and  should not be used as a sole basis for treatment. Nasal washings and  aspirates are unacceptable for Xpert Xpress SARS-CoV-2/FLU/RSV  testing.  Fact Sheet for Patients: PinkCheek.be  Fact Sheet for Healthcare Providers: GravelBags.it  This test  is not yet approved or cleared by the [place] FDA and  has been authorized for detection and/or diagnosis of SARS-CoV-2 by  FDA under an Emergency Use Authorization (EUA). This EUA will remain  in effect (meaning this test can be used) for the duration of the  Covid-19 declaration under Section 564(b)(1) of the Act, 21  U.S.C. section 360bbb-3(b)(1), unless the authorization is  terminated or revoked. Performed at [facility], [address], [place], [place] [address]   Respiratory Panel by RT PCR (Flu A&B, Covid) - Nasopharyngeal Swab     Status: None   Collection Time: 02/12/20 12:33 PM   Specimen: Nasopharyngeal Swab  Result Value Ref Range Status   SARS Coronavirus 2 by RT PCR NEGATIVE NEGATIVE Final    Comment: (NOTE) SARS-CoV-2 target nucleic acids are NOT DETECTED.  The SARS-CoV-2 RNA is generally detectable in upper respiratoy specimens during the acute phase of infection. The lowest concentration of SARS-CoV-2 viral copies this assay can detect is 131 copies/mL. A negative result does not preclude SARS-Cov-2 infection and should not be used as the sole basis for treatment or other patient management decisions. A negative result may occur with  improper specimen collection/handling, submission of specimen other than nasopharyngeal swab, presence of viral mutation(s) within the areas targeted by this assay, and inadequate number of viral copies (<131 copies/mL). A negative result must be combined with clinical observations, patient history, and epidemiological information. The expected result is Negative.  Fact Sheet for Patients:  PinkCheek.be  Fact Sheet for Healthcare Providers:  GravelBags.it  This test is no t yet approved or cleared by the [place] FDA and  has been authorized for detection and/or diagnosis of SARS-CoV-2 by FDA under an Emergency Use Authorization (EUA). This EUA will remain  in  effect (meaning this test can be used) for the duration of the COVID-19 declaration under Section 564(b)(1) of the Act, 21 U.S.C. section 360bbb-3(b)(1), unless the authorization is terminated or revoked sooner.     Influenza A by PCR NEGATIVE NEGATIVE Final   Influenza B by PCR NEGATIVE NEGATIVE Final    Comment: (NOTE) The Xpert Xpress SARS-CoV-2/FLU/RSV assay is intended as an aid in  the diagnosis of influenza from Nasopharyngeal swab specimens and  should not be used as a sole basis for treatment. Nasal washings and  aspirates are unacceptable for Xpert Xpress SARS-CoV-2/FLU/RSV  testing.  Fact Sheet for Patients: PinkCheek.be  Fact Sheet for Healthcare Providers: GravelBags.it  This test is not yet approved or cleared by the [place] and  has been authorized for detection and/or diagnosis of SARS-CoV-2 by  FDA under an Emergency Use Authorization (EUA). This EUA will remain  in effect (meaning this test can be used) for the duration of the  Covid-19 declaration under Section 564(b)(1) of the Act, 21  U.S.C. section 360bbb-3(b)(1), unless the authorization is  terminated or revoked. Performed at [facility], [address], [place], [place] [address]   Surgical pcr screen     Status: None   Collection Time: 02/12/20  6:15 PM   Specimen: Nasal Mucosa; Nasal Swab  Result Value Ref Range Status   MRSA, PCR NEGATIVE NEGATIVE Final   Staphylococcus aureus NEGATIVE NEGATIVE Final    Comment: (NOTE) The Xpert SA Assay (FDA approved for NASAL specimens in patients 69 years of age and older), is one component of a comprehensive surveillance program. It is not intended to diagnose infection nor to guide or monitor treatment. Performed at [facility], [address] [place], [place] [address]     Radiology Reports CT ABDOMEN PELVIS W CONTRAST  Result Date: 02/10/2020 CLINICAL DATA:  Generalized abdominal  pain. EXAM: CT ABDOMEN AND PELVIS WITH CONTRAST TECHNIQUE: Multidetector CT imaging of the abdomen and pelvis was performed using the standard protocol following bolus administration of intravenous contrast. CONTRAST:  40mL OMNIPAQUE IOHEXOL 300 MG/ML  SOLN COMPARISON:  09/05/2018 FINDINGS: Lower chest: There is atelectasis at the lung bases.The heart is enlarged. Hepatobiliary: The liver is normal. The gallbladder is distended. There appears to be some mild gallbladder wall thickening and possible pericholecystic free fluid.There is no biliary ductal dilation. Pancreas: Normal contours without ductal dilatation. No peripancreatic fluid collection. Spleen: Unremarkable. Adrenals/Urinary Tract: --Adrenal glands: Unremarkable. --Right kidney/ureter: There is a punctate nonobstructing stone in lower pole the right kidney. --Left kidney/ureter: There is a nonobstructing stone in the upper pole the left kidney. --Urinary bladder: The urinary bladder is distended. Stomach/Bowel: --Stomach/Duodenum: No hiatal hernia or other gastric abnormality. Normal duodenal course and caliber. --Small bowel: Unremarkable. --Colon: There is a large amount of stool in the right hemicolon. --Appendix: Normal. Vascular/Lymphatic: Normal course and caliber of the major abdominal vessels. --there are mildly enlarged retroperitoneal lymph nodes. --No mesenteric lymphadenopathy. --No pelvic or inguinal lymphadenopathy. Reproductive: The uterus is enlarged and heterogeneous with multiple fibroids. The largest fibroid measures approximately 8.1 cm. These have increased in size since the prior study. There is infiltration of the pelvic fat with a small amount of pelvic free fluid. Other: No ascites or free air. The abdominal wall is normal. Musculoskeletal. No acute displaced fractures. IMPRESSION: 1. The gallbladder is distended with some mild gallbladder wall thickening and possible pericholecystic free fluid. Multiple gallstones are noted. If  there is concern for acute cholecystitis, recommend further evaluation with right upper quadrant ultrasound. 2. Large amount of stool in the right hemicolon. 3. Fibroid uterus, increased in size since the prior study. There is infiltration of the pelvic fat with a small amount of pelvic free fluid. This is of unknown clinical significance. Consider pelvic inflammatory disease in the appropriate clinical setting. 4. Cardiomegaly. 5. Bilateral nonobstructive nephrolithiasis. 6. Distended urinary bladder. Electronically Signed   By: Constance Holster M.D.   On: 02/10/2020 01:57   DG Chest Port 1 View  Result Date: 02/13/2020 CLINICAL DATA:  Post-op aspiration. History of interstitial lung disease. EXAM: PORTABLE CHEST 1 VIEW COMPARISON:  Radiographs 01/30/2019 and 07/13/2017. CT 06/11/2019 and 02/10/2020. FINDINGS: 1302 hours. There are new extensive bilateral airspace opacities with a basilar predominance, slightly worse on  the right. There are possible bilateral pleural effusions. No pneumothorax. The heart and mediastinum are partially obscured by the airspace opacities. Mild cardiomegaly appears grossly unchanged. The stomach is distended. There are postsurgical changes in the upper abdomen. No acute osseous findings. IMPRESSION: Extensive bilateral airspace opacities consistent with aspiration or multilobar pneumonia. Possible bilateral pleural effusions. Electronically Signed   By: Richardean Sale M.D.   On: 02/13/2020 13:37   DG Chest Port 1V same Day  Result Date: 02/13/2020 CLINICAL DATA:  Status post intubation. EXAM: PORTABLE CHEST 1 VIEW COMPARISON:  February 13, 2020. FINDINGS: Stable cardiomediastinal silhouette. Endotracheal tube is in good position. Stable bilateral lung opacities are noted concerning for multifocal pneumonia. No pneumothorax is noted. Bilateral pleural effusions are noted. Bony thorax is unremarkable. IMPRESSION: Endotracheal tube in good position. Stable bilateral lung  opacities are noted concerning for multifocal pneumonia. Bilateral pleural effusions are noted. Electronically Signed   By: Marijo Conception M.D.   On: 02/13/2020 14:10   ECHOCARDIOGRAM COMPLETE  Result Date: 02/13/2020    ECHOCARDIOGRAM REPORT   Patient Name:   ZYAIRE DUMAS Date of Exam: 02/13/2020 Medical Rec #:  [id number]       Height:       65.0 in Accession #:    [id number]      Weight:       130.1 lb Date of Birth:  [birth year]        BSA:          1.648 m Patient Age:    21 years        BP:           153/102 mmHg Patient Gender: F               HR:           100 bpm. Exam Location:  [facility] Procedure: 2D Echo, Cardiac Doppler and Color Doppler STAT ECHO Indications:    Cardiac arrest I46.9  History:        Patient has prior history of Echocardiogram examinations, most                 recent 11/18/2018. Risk Factors:Hypertension. ILD (interstitial                 lung disease).  Sonographer:    Alvino Chapel RCS Referring Phys: [phone] [place]  1. Left ventricular ejection fraction, by estimation, is 65 to 70%. The left ventricle has normal function. The left ventricle has no regional wall motion abnormalities. There is moderate left ventricular hypertrophy. Left ventricular diastolic parameters are indeterminate.  2. Right ventricular systolic function is low normal. The right ventricular size is normal. Tricuspid regurgitation signal is inadequate for assessing PA pressure.  3. The mitral valve is grossly normal. Trivial mitral valve regurgitation.  4. The aortic valve is grossly normal, not completely visualized. Aortic valve regurgitation is trivial.  5. No pericardial effusion. FINDINGS  Left Ventricle: Left ventricular ejection fraction, by estimation, is 65 to 70%. The left ventricle has normal function. The left ventricle has no regional wall motion abnormalities. The left ventricular internal cavity size was normal in size. There is  moderate left ventricular hypertrophy. Left  ventricular diastolic parameters are indeterminate. Right Ventricle: The right ventricular size is normal. No increase in right ventricular wall thickness. Right ventricular systolic function is low normal. Tricuspid regurgitation signal is inadequate for assessing PA pressure. Left Atrium: Left atrial size was normal in size. Right Atrium: Right  atrial size was normal in size. Pericardium: There is no evidence of pericardial effusion. Mitral Valve: The mitral valve is grossly normal. Trivial mitral valve regurgitation. Tricuspid Valve: The tricuspid valve is grossly normal. Tricuspid valve regurgitation is mild. Aortic Valve: The aortic valve is grossly normal. Aortic valve regurgitation is trivial. Pulmonic Valve: The pulmonic valve was not assessed. Pulmonic valve regurgitation is not visualized. Aorta: The aortic root is normal in size and structure. IAS/Shunts: The interatrial septum was not assessed.  LEFT VENTRICLE PLAX 2D LVIDd:         3.07 cm LVIDs:         2.17 cm LV PW:         1.42 cm LV IVS:        1.59 cm LVOT diam:     1.60 cm LV SV:         30 LV SV Index:   18 LVOT Area:     2.01 cm  RIGHT VENTRICLE RV S prime:     9.25 cm/s TAPSE (M-mode): 1.9 cm LEFT ATRIUM         Index LA diam:    2.30 cm 1.40 cm/m  AORTIC VALVE LVOT Vmax:   81.80 cm/s LVOT Vmean:  61.200 cm/s LVOT VTI:    0.148 m  AORTA Ao Root diam: 3.10 cm MITRAL VALVE               TRICUSPID VALVE MV Area (PHT): 7.16 cm    TR Peak grad:   23.6 mmHg MV Decel Time: 106 msec    TR Vmax:        243.00 cm/s MV E velocity: 83.60 cm/s                            SHUNTS                            Systemic VTI:  0.15 m                            Systemic Diam: 1.60 cm Rozann Lesches MD Electronically signed by Rozann Lesches MD Signature Date/Time: 02/13/2020/2:11:06 PM    Final    US Abdomen Limited RUQ  Result Date: 02/12/2020 CLINICAL DATA:  3 day history of right upper quadrant pain. EXAM: ULTRASOUND ABDOMEN LIMITED RIGHT UPPER QUADRANT  COMPARISON:  Abdomen/pelvis CT 02/10/2020 FINDINGS: Gallbladder: As on recent CT scan, multiple calcified gallstones evident. Shadowing makes measurement of the stones difficult but these probably measure up to at least 2.8 cm. Gallbladder wall is thickened at 4 mm. Sonographer reports a positive sonographic Murphy sign. Common bile duct: Diameter: 4-5 mm Liver: No focal lesion identified. Within normal limits in parenchymal echogenicity. Portal vein is patent on color Doppler imaging with normal direction of blood flow towards the liver. Other: None. IMPRESSION: Cholelithiasis with gallbladder wall thickening and positive sonographic Murphy sign. Imaging features consistent with acute cholecystitis. Electronically Signed   By: Misty Stanley M.D.   On: 02/12/2020 08:08     CBC Recent Labs  Lab 02/09/20 2352 02/12/20 0230  WBC 11.6* 8.4  HGB 12.3 11.4*  HCT 39.7 37.1  PLT 220 215  MCV 87.8 89.4  MCH 27.2 27.5  MCHC 31.0 30.7  RDW 13.8 13.7  LYMPHSABS 1.3 1.2  MONOABS 1.2* 1.0  EOSABS 0.0 0.2  BASOSABS 0.0 0.0  Chemistries  Recent Labs  Lab 02/09/20 2352 02/12/20 0230 02/13/20 1324  NA 134* 135 137  K 3.2* 3.3* 3.6  CL 101 103 103  CO2 22 26 23   GLUCOSE 107* 114* 247*  BUN 9 8 5*  CREATININE 0.53 0.61 0.66  CALCIUM 10.4* 9.6 9.0  AST 18 16 61*  ALT 11 10 43  ALKPHOS 66 57 57  BILITOT 1.1 0.5 0.3   ------------------------------------------------------------------------------------------------------------------ No results for input(s): CHOL, HDL, LDLCALC, TRIG, CHOLHDL, LDLDIRECT in the last 72 hours.  No results found for: HGBA1C ------------------------------------------------------------------------------------------------------------------ No results for input(s): TSH, T4TOTAL, T3FREE, THYROIDAB in the last 72 hours.  Invalid input(s): FREET3 ------------------------------------------------------------------------------------------------------------------ No  results for input(s): VITAMINB12, FOLATE, FERRITIN, TIBC, IRON, RETICCTPCT in the last 72 hours.  Coagulation profile No results for input(s): INR, PROTIME in the last 168 hours.  Recent Labs    02/13/20 1324  DDIMER 8.03*    Cardiac Enzymes No results for input(s): CKMB, TROPONINI, MYOGLOBIN in the last 168 hours.  Invalid input(s): CK ------------------------------------------------------------------------------------------------------------------    Component Value Date/Time   BNP 199.4 (H) 01/30/2019 1416     Roxan Hockey M.D on 02/13/2020 at 3:15 PM  Go to [email or web address] - for contact info  [facility] - Office  [phone]

## 2020-02-13 NOTE — Progress Notes (Signed)
Notified MD of increased BP's

## 2020-02-13 NOTE — Progress Notes (Signed)
*  PRELIMINARY RESULTS* Echocardiogram 2D Echocardiogram has been performed.  Samuel Germany 02/13/2020, 1:44 PM

## 2020-02-13 NOTE — Anesthesia Procedure Notes (Signed)
Procedure Name: Intubation Performed by: Tacy Learn, CRNA Pre-anesthesia Checklist: Patient identified, Emergency Drugs available, Suction available, Patient being monitored and Timeout performed Patient Re-evaluated:Patient Re-evaluated prior to induction Oxygen Delivery Method: Circle system utilized Preoxygenation: Pre-oxygenation with 100% oxygen Induction Type: IV induction Laryngoscope Size: 2 Grade View: Grade II Tube size: 7.0 mm Number of attempts: 1 Airway Equipment and Method: Stylet Placement Confirmation: ETT inserted through vocal cords under direct vision,  positive ETCO2,  CO2 detector and breath sounds checked- equal and bilateral Secured at: 21 cm Tube secured with: Tape Dental Injury: Teeth and Oropharynx as per pre-operative assessment

## 2020-02-13 NOTE — Consult Note (Addendum)
NAME:  Karen [name], MRN:  [id number], DOB:  1971/04/01, LOS: 1 ADMISSION DATE:  02/12/2020, CONSULTATION DATE:  10/8 REFERRING MD:  Anastasia Pall, CHIEF COMPLAINT:  resp failure s/p Lap chole   Brief History   35 yobf never smoker f/b Dr Vaughan Browner for scleroderma/ILD req Lap chole am 10/8 and ? Flash pulmonary edema in RR so reintubated and sent to ICU still on vent and PCCM service asked to eval   History of present illness    49 year old black female recently diagnosed with scleroderma and interstitial lung disease who presented to the emergency room with worsening right upper quadrant abdominal pain, nausea, and vomiting.  She states she has had multiple episodes of this over the past year.  She does have a dietitian who is helping her adjust her diet.  She states that the pain comes on suddenly and is only relieved with belching or emesis.  She denies any fever or chills.  Ultrasound the gallbladder reveals cholelithiasis with gallbladder wall thickening and a positive sonographic Murphy sign.  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 Haroldine Laws on 09/27/17 with no evidence of pulmonary hypertension Started on CellCept May 2019 and uptitrated to max dose of 1.5 mg twice daily.  She had a hospitalization in [facility] in March 2019 for hypertension.  A CT chest scan at that time showed subtle lower lung findings of atelectasis, groundglass.    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  Significant Hospital Events   Lap Chole am 10/8 - reintubated in PACU  Consults:  Gen Surgery 10/7 PCCM  10/8   Procedures:  Oral ET p surgery 10/8 ? Flash  Pulmonary edema   Significant Diagnostic Tests:  Echo 10/9>>>1. Left ventricular ejection fraction, by estimation, is 65 to 70%. The  left ventricle has normal function. The left ventricle has no regional  wall motion abnormalities. There is moderate left ventricular hypertrophy.  Left ventricular diastolic parameters are indeterminate.  2. Right ventricular systolic function is low normal. The right  ventricular size is normal.    Micro Data:  MRSA PCR  10/7  Neg  COVID 19  PCR  10/7  Neg  UC  10/7 >>>  Antimicrobials:  Zosyn 10/7 x one Rocephin 10/7 >>>   Scheduled Meds: . acetaminophen (TYLENOL) oral liquid 160 mg/5 mL  325 mg Per Tube Q6H  . bisoprolol  5 mg Oral Daily  . Chlorhexidine Gluconate Cloth  6 each Topical Daily  . docusate  100 mg Oral BID  . furosemide  40 mg Intravenous Once  . heparin  5,000 Units Subcutaneous Q8H  . influenza vac split quadrivalent PF  0.5 mL Intramuscular Tomorrow-1000  . macitentan  10 mg Oral Daily  . multivitamin with minerals  1 tablet Oral Daily  . mycophenolate  1,500 mg Oral BID  . Nintedanib  150 mg Oral BID  . polyethylene glycol  17 g Oral Daily  . sodium chloride flush  3 mL Intravenous Q12H  . sulfamethoxazole-trimethoprim  1 tablet Oral Daily  . [START ON 02/14/2020] thyroid  90 mg Oral QAC breakfast   Continuous Infusions: . sodium chloride    . cefTRIAXone (ROCEPHIN)  IV  0 g (02/12/20 1230)  . dextrose 5 % and 0.45% NaCl 125 mL/hr at 02/12/20 1802  . potassium chloride    . propofol (DIPRIVAN) infusion     PRN Meds:.sodium chloride, acetaminophen **OR** acetaminophen, bisacodyl, diphenhydrAMINE, fentaNYL (SUBLIMAZE) injection, fentaNYL (SUBLIMAZE) injection, HYDROmorphone (DILAUDID) injection, labetalol, ondansetron **OR** ondansetron (ZOFRAN) IV, oxyCODONE, polyethylene glycol, prednisoLONE acetate, sodium chloride flush, traZODone    Interim history/subjective:  Remains intubated post op sedated on diprovan    Objective   Blood pressure (!) 112/96, pulse 96, temperature 98.2 F (36.8 C), temperature source Oral, resp. rate (!) 22, height 5\' 5"  (1.651 m), weight 59 kg, SpO2 (!) 79 %.        Intake/Output Summary (Last 24 hours) at 02/13/2020 1533 Last data filed at 02/13/2020 1412 Gross per 24 hour  Intake 2511.86 ml  Output 1400 ml  Net 1111.86 ml   Filed Weights   02/12/20 0119  Weight: 59 kg    Examination: General: Tmax 98.2  HENT: ET inplace  Lungs: distant bs, min rhonchi Cardiovascular: RRR elevated bp noted  Abdomen: mod distended, wounds clean and dry   Extremities: warm/ pos PAS  Neuro: sedated on vent     I personally reviewed images and agree with radiology impression as follows:  CXR:   Portable   10/8 Endotracheal tube in good position. Stable bilateral lung opacities are noted concerning for multifocal pneumonia. Bilateral pleural effusions are noted. My review: more c/w acute edema   Resolved Hospital Problem list      Assessment & Plan:  1)  Vent dep acute resp failure/ hypoxemia and hypercarbic  post op in setting of ILD From scleroderma and now low Cabd s/p cholecystemy am 10/8 ? Assoc with pulmonary edema     Appears to be vol overloaded post op >> diuresis and bp control with iv labetolol may well need drip to control   2) scleroderma/ ILD with  Mild PAH   >>> hold OFEV/cellcept  for now as may impede wound healing   Best practice:  Diet: npo Pain/Anxiety/Delirium protocol (if indicated): per [facility]/ Gen sugery  VAP protocol (if indicated):  DVT prophylaxis: pas then hep sq when hemostatis achieved post op GI prophylaxis: ppi Glucose control: per [facility]  Mobility:SBR for now Code Status: full  Family Communication: per [facility]/ Dr Arnoldo Morale Disposition: to IcU  Labs   CBC: Recent Labs  Lab 02/09/20 2352 02/12/20 0230  WBC 11.6* 8.4  NEUTROABS 9.2* 6.0  HGB 12.3 11.4*  HCT 39.7 37.1  MCV 87.8 89.4  PLT 220 485    Basic Metabolic  Panel: Recent Labs  Lab 02/09/20 2352 02/12/20 0230 02/13/20 1324  NA 134* 135 137  K 3.2* 3.3* 3.6  CL 101 103 103  CO2 22 26 23   GLUCOSE 107* 114* 247*  BUN 9 8 5*  CREATININE 0.53 0.61 0.66  CALCIUM 10.4* 9.6 9.0   GFR: Estimated Creatinine Clearance: 74.9 mL/min (by C-G formula based on SCr of 0.66 mg/dL). Recent Labs  Lab 02/09/20 2352 02/12/20 0230  WBC 11.6* 8.4    Liver Function Tests: Recent Labs  Lab 02/09/20 2352 02/12/20 0230 02/13/20 1324  AST 18 16 61*  ALT 11 10 43  ALKPHOS 66 57 57  BILITOT 1.1 0.5 0.3  PROT 8.1 8.0 7.3  ALBUMIN 4.1 3.5 3.2*   Recent Labs  Lab 02/09/20 2352 02/12/20 0230  LIPASE 21 20   No results for input(s): AMMONIA in the last 168 hours.  ABG  Component Value Date/Time   PHART 7.048 (LL) 02/13/2020 1324   PCO2ART 94.1 (HH) 02/13/2020 1324   PO2ART 115 (H) 02/13/2020 1324   HCO3 17.9 (L) 02/13/2020 1324   TCO2 23 09/05/2019 1352   ACIDBASEDEF 4.8 (H) 02/13/2020 1324   O2SAT 95.1 02/13/2020 1324     Coagulation Profile: No results for input(s): INR, PROTIME in the last 168 hours.  Cardiac Enzymes: No results for input(s): CKTOTAL, CKMB, CKMBINDEX, TROPONINI in the last 168 hours.  HbA1C: No results found for: HGBA1C  CBG: No results for input(s): GLUCAP in the last 168 hours.     Past Medical History  She,  has a past medical history of GERD (gastroesophageal reflux disease), Hypertension, Hypothyroidism, ILD (interstitial lung disease) ([place]) (02/08/2018), Interstitial lung disease ([place]), Multinodular thyroid, Perimenopause (02/27/2019), Scleroderma ([place]), and Vitamin D deficiency disease (02/27/2019).   Surgical History    Past Surgical History:  Procedure Laterality Date  . ABDOMINAL HERNIA REPAIR    . BUNIONECTOMY Bilateral 10/2018  . COLONOSCOPY WITH PROPOFOL N/A 01/02/2019   Procedure: COLONOSCOPY WITH PROPOFOL;  Surgeon: Daneil Dolin, MD;  [facility];  Service: Endoscopy;   Laterality: N/A;  12:30pm  . ESOPHAGOGASTRODUODENOSCOPY (EGD) WITH PROPOFOL N/A 01/28/2018   erosive reflux esophagitis, patulous EG Junction, no dilation, incomplete EGD due to retained food in stomach. GES thereafter with delayed gastric emptying.   Marland Kitchen RIGHT HEART CATH N/A 09/27/2017   Procedure: RIGHT HEART CATH;  Surgeon: Jolaine Artist, MD;  Location: [place] [facility];  Service: Cardiovascular;  Laterality: N/A;  . RIGHT HEART CATH N/A 09/05/2019   Procedure: RIGHT HEART CATH;  Surgeon: Jolaine Artist, MD;  Location: [place] [facility];  Service: Cardiovascular;  Laterality: N/A;  . UTERINE FIBROID SURGERY       Social History   reports that she has never smoked. She has never used smokeless tobacco. She reports that she does not drink alcohol and does not use drugs.   Family History   Her family history includes Diabetes in her maternal grandfather, maternal uncle, and mother; Hypertension in her brother, father, and sister. There is no history of Colon cancer.   Allergies Allergies  Allergen Reactions  . Lisinopril Hives and Swelling     Home Medications  Prior to Admission medications   Medication Sig Start Date End Date Taking? Authorizing Provider  azelastine (OPTIVAR) 0.05 % ophthalmic solution Place 1 drop into both eyes daily as needed (allergies).  09/29/19  Yes [provider]  bisoprolol (ZEBETA) 5 MG tablet Take 1 tablet (5 mg total) by mouth daily. 01/16/20  Yes Bensimhon, Shaune Pascal, MD  Cholecalciferol (VITAMIN D-3) 5000 units TABS Take 10,000 Units by mouth daily.    Yes [provider]  esomeprazole (NEXIUM) 40 MG capsule TAKE 1 CAPSULE BY MOUTH TWO TIMES DAILY Patient taking differently: Take 40 mg by mouth in the morning and at bedtime.  08/21/19  Yes Mahala Menghini, PA-C  furosemide (LASIX) 40 MG tablet Take 40 mg by mouth as needed for edema. Except Saturdays and Sundays    Yes [provider]  macitentan (OPSUMIT) 10 MG tablet  Take 1 tablet (10 mg total) by mouth daily. 09/11/19  Yes Bensimhon, Shaune Pascal, MD  Multiple Vitamins-Minerals (MEGA MULTIVITAMIN) POWD Take 1 Scoop by mouth daily. Mix 1 scoop in water   Yes [provider]  mycophenolate (CELLCEPT) 500 MG tablet TAKE 3 TABLETS BY MOUTH IN THE MORNING AND 3 TABLETS IN  THE EVENING Patient taking differently: Take 1,500 mg by mouth 2 (two) times daily.  11/20/19  Yes Mannam, Praveen, MD  Nintedanib (OFEV) 150 MG CAPS Take 1 capsule (150 mg total) by mouth 2 (two) times daily. 11/20/19  Yes Mannam, Praveen, MD  NP THYROID 90 MG tablet Take 1 tablet (90 mg total) by mouth daily. 01/29/20  Yes Gosrani, Nimish C, MD  ondansetron (ZOFRAN) 4 MG tablet Take 1 tablet (4 mg total) by mouth every 8 (eight) hours as needed for nausea or vomiting. 02/11/20  Yes Gosrani, Nimish C, MD  oxyCODONE (OXY IR/ROXICODONE) 5 MG immediate release tablet Take 1 tablet (5 mg total) by mouth every 4 (four) hours as needed for severe pain. 02/11/20  Yes Gosrani, Nimish C, MD  potassium chloride 20 MEQ/15ML (10%) SOLN Take 20 mEq by mouth as needed (with Lasix for Edema). Except on saturdays and sundays    Yes [provider]  prednisoLONE acetate (PRED FORTE) 1 % ophthalmic suspension Place 1 drop into both eyes as needed (irritation).  09/18/19  Yes [provider]  spironolactone (ALDACTONE) 25 MG tablet TAKE 1 TABLET BY MOUTH DAILY. Patient taking differently: Take 25 mg by mouth daily.  11/20/19  Yes Bensimhon, Shaune Pascal, MD  sulfamethoxazole-trimethoprim (BACTRIM DS) 800-160 MG tablet Take 1 tablet by mouth daily. 11/20/19  Yes Mannam, Hart Robinsons, MD  thyroid (NP THYROID) 60 MG tablet Take 1 tablet (60 mg total) by mouth daily before breakfast. Patient not taking: Reported on 02/12/2020 09/15/19   [name], MD       The patient is critically ill with multiple organ systems failure and requires high complexity decision making for assessment and support, frequent  evaluation and titration of therapies, application of advanced monitoring technologies and extensive interpretation of multiple databases. Critical Care Time devoted to patient care services described in this note is 45 minutes.    Christinia Gully, MD [facility] [place] [phone]   After 7:00 pm call Elink  [phone]

## 2020-02-13 NOTE — Progress Notes (Signed)
Approximately 1 hour after completion surgery, patient had to be reintubated for flash pulmonary edema.  While in the PACU, the patient did get a right femoral triple-lumen catheter in the left radial arterial line.  A stat echo was performed at bedside.  Labs are pending.  Dr. Denton Brick came to bedside and will transfer the patient to the ICU.  He is already made surgical care aware.  Cardiology is also aware.  I did talk with the mother of the patient.

## 2020-02-13 NOTE — Anesthesia Postprocedure Evaluation (Signed)
Anesthesia Post Note  Patient: Karen [name]  Procedure(s) Performed: LAPAROSCOPIC CHOLECYSTECTOMY (N/A )  Patient location during evaluation: PACU Anesthesia Type: General Level of consciousness: awake, oriented, awake and alert and patient cooperative Pain management: pain level controlled Vital Signs Assessment: post-procedure vital signs reviewed and stable Respiratory status: spontaneous breathing, respiratory function stable and nonlabored ventilation Cardiovascular status: blood pressure returned to baseline and stable Postop Assessment: no headache and no backache Anesthetic complications: no   No complications documented.   Last Vitals:  Vitals:   02/13/20 0904 02/13/20 0910  BP: (!) 161/100   Pulse: 75   Resp: 18   Temp: 36.8 C   SpO2:  99%    Last Pain:  Vitals:   02/13/20 0904  TempSrc: Oral  PainSc:                  Tacy Learn

## 2020-02-13 NOTE — Interval H&P Note (Signed)
History and Physical Interval Note:  02/13/2020 9:38 AM  Karen [name]  has presented today for surgery, with the diagnosis of cholecystitis, cholelithiasis.  The various methods of treatment have been discussed with the patient and family. After consideration of risks, benefits and other options for treatment, the patient has consented to  Procedure(s): LAPAROSCOPIC CHOLECYSTECTOMY (N/A) as a surgical intervention.  The patient's history has been reviewed, patient examined, no change in status, stable for surgery.  I have reviewed the patient's chart and labs.  Questions were answered to the patient's satisfaction.     Aviva Signs

## 2020-02-14 ENCOUNTER — Inpatient Hospital Stay (HOSPITAL_COMMUNITY): Payer: 59

## 2020-02-14 DIAGNOSIS — K8000 Calculus of gallbladder with acute cholecystitis without obstruction: Secondary | ICD-10-CM | POA: Diagnosis not present

## 2020-02-14 DIAGNOSIS — Z23 Encounter for immunization: Secondary | ICD-10-CM | POA: Diagnosis not present

## 2020-02-14 DIAGNOSIS — K5909 Other constipation: Secondary | ICD-10-CM | POA: Diagnosis not present

## 2020-02-14 DIAGNOSIS — J810 Acute pulmonary edema: Secondary | ICD-10-CM | POA: Diagnosis not present

## 2020-02-14 DIAGNOSIS — J9601 Acute respiratory failure with hypoxia: Secondary | ICD-10-CM | POA: Diagnosis not present

## 2020-02-14 DIAGNOSIS — K219 Gastro-esophageal reflux disease without esophagitis: Secondary | ICD-10-CM | POA: Diagnosis not present

## 2020-02-14 DIAGNOSIS — Z888 Allergy status to other drugs, medicaments and biological substances status: Secondary | ICD-10-CM | POA: Diagnosis not present

## 2020-02-14 DIAGNOSIS — Z8249 Family history of ischemic heart disease and other diseases of the circulatory system: Secondary | ICD-10-CM | POA: Diagnosis not present

## 2020-02-14 DIAGNOSIS — M3483 Systemic sclerosis with polyneuropathy: Secondary | ICD-10-CM | POA: Diagnosis not present

## 2020-02-14 LAB — COMPREHENSIVE METABOLIC PANEL
ALT: 54 U/L — ABNORMAL HIGH (ref 0–44)
AST: 53 U/L — ABNORMAL HIGH (ref 15–41)
Albumin: 3.1 g/dL — ABNORMAL LOW (ref 3.5–5.0)
Alkaline Phosphatase: 48 U/L (ref 38–126)
Anion gap: 11 (ref 5–15)
BUN: 6 mg/dL (ref 6–20)
CO2: 27 mmol/L (ref 22–32)
Calcium: 9.2 mg/dL (ref 8.9–10.3)
Chloride: 99 mmol/L (ref 98–111)
Creatinine, Ser: 0.79 mg/dL (ref 0.44–1.00)
GFR, Estimated: >60 mL/min (ref >60)
Glucose, Bld: 136 mg/dL — ABNORMAL HIGH (ref 70–99)
Potassium: 3.2 mmol/L — ABNORMAL LOW (ref 3.5–5.1)
Sodium: 137 mmol/L (ref 135–145)
Total Bilirubin: 0.6 mg/dL (ref 0.3–1.2)
Total Protein: 7.1 g/dL (ref 6.5–8.1)

## 2020-02-14 LAB — GLUCOSE, CAPILLARY
Glucose-Capillary: 111 mg/dL — ABNORMAL HIGH (ref 70–99)
Glucose-Capillary: 113 mg/dL — ABNORMAL HIGH (ref 70–99)
Glucose-Capillary: 176 mg/dL — ABNORMAL HIGH (ref 70–99)
Glucose-Capillary: 188 mg/dL — ABNORMAL HIGH (ref 70–99)
Glucose-Capillary: 86 mg/dL (ref 70–99)
Glucose-Capillary: 88 mg/dL (ref 70–99)

## 2020-02-14 LAB — BLOOD GAS, ARTERIAL
Acid-Base Excess: 4.6 mmol/L — ABNORMAL HIGH (ref 0.0–2.0)
Acid-Base Excess: 5.3 mmol/L — ABNORMAL HIGH (ref 0.0–2.0)
Bicarbonate: 29.2 mmol/L — ABNORMAL HIGH (ref 20.0–28.0)
Bicarbonate: 28.9 mmol/L — ABNORMAL HIGH (ref 20.0–28.0)
FIO2: 35
FIO2: 35
O2 Saturation: 99.3 %
O2 Saturation: 99 %
Patient temperature: 37
Patient temperature: 37
pCO2 arterial: 31.2 mmHg — ABNORMAL LOW (ref 32.0–48.0)
pCO2 arterial: 45.3 mmHg (ref 32.0–48.0)
pH, Arterial: 7.55 — ABNORMAL HIGH (ref 7.350–7.450)
pH, Arterial: 7.43 (ref 7.350–7.450)
pO2, Arterial: 154 mmHg — ABNORMAL HIGH (ref 83.0–108.0)
pO2, Arterial: 153 mmHg — ABNORMAL HIGH (ref 83.0–108.0)

## 2020-02-14 LAB — CBC
HCT: 33 % — ABNORMAL LOW (ref 36.0–46.0)
Hemoglobin: 10.5 g/dL — ABNORMAL LOW (ref 12.0–15.0)
MCH: 27.4 pg (ref 26.0–34.0)
MCHC: 31.8 g/dL (ref 30.0–36.0)
MCV: 86.2 fL (ref 80.0–100.0)
Platelets: 248 10*3/uL (ref 150–400)
RBC: 3.83 MIL/uL — ABNORMAL LOW (ref 3.87–5.11)
RDW: 13.2 % (ref 11.5–15.5)
WBC: 13.5 10*3/uL — ABNORMAL HIGH (ref 4.0–10.5)
nRBC: 0 % (ref 0.0–0.2)

## 2020-02-14 LAB — TRIGLYCERIDES: Triglycerides: 135 mg/dL (ref <150)

## 2020-02-14 MED ORDER — POTASSIUM CHLORIDE 10 MEQ/100ML IV SOLN
10.0000 meq | INTRAVENOUS | Status: AC
  Administered 2020-02-14 (×4): 10 meq via INTRAVENOUS
  Filled 2020-02-14 (×4): qty 100

## 2020-02-14 MED ORDER — BISACODYL 10 MG RE SUPP
10.0000 mg | Freq: Once | RECTAL | Status: AC
  Administered 2020-02-14: 10 mg via RECTAL
  Filled 2020-02-14: qty 1

## 2020-02-14 MED ORDER — FUROSEMIDE 10 MG/ML IJ SOLN
40.0000 mg | Freq: Once | INTRAMUSCULAR | Status: AC
  Administered 2020-02-14: 40 mg via INTRAVENOUS
  Filled 2020-02-14: qty 4

## 2020-02-14 MED ORDER — PHENOL 1.4 % MT LIQD
1.0000 | OROMUCOSAL | Status: DC | PRN
  Administered 2020-02-14: 1 via OROMUCOSAL
  Filled 2020-02-14: qty 177

## 2020-02-14 MED ORDER — LABETALOL HCL 5 MG/ML IV SOLN: 20.0000 mg | Freq: Four times a day (QID) | INTRAVENOUS | Status: DC | PRN

## 2020-02-14 MED ORDER — POLYETHYLENE GLYCOL 3350 17 G PO PACK
17.0000 g | PACK | Freq: Every day | ORAL | Status: DC
  Administered 2020-02-14 – 2020-02-15 (×2): 17 g via ORAL
  Filled 2020-02-14 (×2): qty 1

## 2020-02-14 MED ORDER — FUROSEMIDE 10 MG/ML IJ SOLN
20.0000 mg | Freq: Once | INTRAMUSCULAR | Status: AC
  Administered 2020-02-14: 20 mg via INTRAVENOUS
  Filled 2020-02-14: qty 2

## 2020-02-14 MED ORDER — SENNOSIDES-DOCUSATE SODIUM 8.6-50 MG PO TABS
2.0000 | ORAL_TABLET | Freq: Two times a day (BID) | ORAL | Status: DC
  Administered 2020-02-14 – 2020-02-15 (×2): 2 via ORAL
  Filled 2020-02-14 (×2): qty 2

## 2020-02-14 MED ORDER — AMLODIPINE BESYLATE 5 MG PO TABS
10.0000 mg | ORAL_TABLET | Freq: Every day | ORAL | Status: DC
  Administered 2020-02-14 – 2020-02-15 (×2): 10 mg via ORAL
  Filled 2020-02-14 (×2): qty 2

## 2020-02-14 MED ORDER — POTASSIUM CHLORIDE CRYS ER 20 MEQ PO TBCR
40.0000 meq | EXTENDED_RELEASE_TABLET | Freq: Once | ORAL | Status: AC
  Administered 2020-02-14: 40 meq via ORAL
  Filled 2020-02-14: qty 2

## 2020-02-14 NOTE — Progress Notes (Signed)
Patient Demographics:    Karen [name], is a 49 y.o. female, DOB - [birth year], FTD:[id number]  Admit date - 02/12/2020   Admitting Physician Judia Arnott Denton Brick, MD  Outpatient Primary MD for the patient is [name], MD  LOS - 2   Chief Complaint  Patient presents with  . Abdominal Pain        Subjective:    Karen [name]   -Intubated 02/13/2020 --Extubated 02/14/2020  Intubated 02/13/2020, did well with sedation vacation and weaning protocol on 02/14/2020 and successfully extubated -Patient's mother at bedside, questions answered -Tolerating clear liquid diet okay   Assessment  & Plan :    Principal Problem:   Acute respiratory failure with hypoxia due to flash pulmonary edema requiring intubation Active Problems:   ILD (interstitial lung disease) ([place])   Acute calculus cholecystitis   HTN (hypertension)   Calculus of gallbladder with acute cholecystitis without obstruction   Status post laparoscopic cholecystectomy 02/13/20   Sjogren's syndrome ([place])   GERD (gastroesophageal reflux disease)   Constipation   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 female with past medical history relevant for scleroderma, Sjogren's syndrome, interstitial lung disease/PAH, GERD and HTN as well as hypothyroidism ---admitted on 02/12/2020 with Acute calculus cholecystitis, underwent lap chole on 02/13/2020 -Shortly postop on 02/13/20 patient developed shortness of breath  with frothy sputum from the mouth and clinical exam and radiological studies consistent with flash pulmonary edema with hypoxic and hypercapnic respiratory failure---  patient reintubated in PACU, discussed with Dr. Arnoldo Morale general surgeon, discussed with anesthesiologist at bedside, -Pulmonary consult from Dr. Melvyn Novas appreciated  -preliminary echo report with preserved EF and no  significant wall motion abnormalities -Successfully extubated on 02/14/2020 after diuresis  A/p 1) acute hypoxic and hypercapnic respiratory failure----suspect secondary to flash pulmonary edema-- --Intubated 02/13/2020 --Extubated 02/14/2020 -Clinical exam and radiological findings significantly improved after IV diuresis -Currently on 4 L of oxygen via nasal cannula  2)ILD/PAH--- patient follows with Dr. Vaughan Browner, continue OFEV and Opsumit -Pulmonology consult from Dr. Melvyn Novas appreciated  3) scleroderma/Sjogren's syndrome--- continue CellCept, continue Bactrim for prophylaxis of opportunistic infection  4)HTN-stable, continue bisoprolol 5 mg daily, add amlodipine for better BP control -- IV labetalol as needed elevated BP  5) hypothyroidism---  resume levothyroxine   6) chronic constipation--- laxatives as ordered  7) possible UTI---  continue Rocephin pending culture results  8)Acute calculus cholecystitis--- lap chole on 02/13/2020 -Postop management per general surgeon -Clear liquid diet  02/14/20 --Remove NG tube -Remove Foley catheter   Disposition/Need for in-Hospital Stay- patient unable to be discharged at this time due to --- acute hypoxic and hypercapnic respiratory failure extubated 02/14/2020 continue oxygen supplementation not medically ready for discharge  Status is: Inpatient  Remains inpatient appropriate because:acute hypoxic and hypercapnic respiratory failure extubated 02/14/2020 continue oxygen supplementation not medically ready for discharge   Disposition: The patient is from: Home              Anticipated d/c is to: Home              Anticipated d/c date is: 2 days              Patient currently is  not medically stable to d/c. Barriers: Not Clinically Stable- -acute hypoxic and hypercapnic respiratory failure extubated 02/14/2020 continue oxygen supplementation not medically ready for discharge  Code Status : Full code  Procedures:- -Intubated  02/13/2020 --Extubated 02/14/2020 -Lap chole 02/13/2020  Family Communication:  Discussed with pt's mother   Consults  :  PCCM/Gen surgery  DVT Prophylaxis  :  - SCDs   Lab Results  Component Value Date   PLT 248 02/14/2020    Inpatient Medications  Scheduled Meds: . acetaminophen (TYLENOL) oral liquid 160 mg/5 mL  325 mg Per Tube Q6H  . amLODipine  10 mg Oral Daily  . Chlorhexidine Gluconate Cloth  6 each Topical Daily  . docusate  100 mg Oral BID  . heparin  5,000 Units Subcutaneous Q8H  . influenza vac split quadrivalent PF  0.5 mL Intramuscular Tomorrow-1000  . macitentan  10 mg Oral Daily  . multivitamin with minerals  1 tablet Oral Daily  . sodium chloride flush  3 mL Intravenous Q12H  . thyroid  90 mg Oral QAC breakfast   Continuous Infusions: . sodium chloride    . cefTRIAXone (ROCEPHIN)  IV Stopped (02/14/20 1191)  . dextrose 5 % and 0.45% NaCl 50 mL/hr at 02/14/20 1614   PRN Meds:.sodium chloride, acetaminophen **OR** acetaminophen, bisacodyl, diphenhydrAMINE, HYDROmorphone (DILAUDID) injection, labetalol, ondansetron **OR** ondansetron (ZOFRAN) IV, phenol, prednisoLONE acetate, sodium chloride flush    Anti-infectives (From admission, onward)   Start     Dose/Rate Route Frequency Ordered Stop   02/12/20 1630  sulfamethoxazole-trimethoprim (BACTRIM DS) 800-160 MG per tablet 1 tablet  Status:  Discontinued        1 tablet Oral Daily 02/12/20 1627 02/13/20 1542   02/12/20 0930  cefTRIAXone (ROCEPHIN) 2 g in sodium chloride 0.9 % 100 mL IVPB        2 g 200 mL/hr over 30 Minutes Intravenous Every 24 hours 02/12/20 0923     02/12/20 0900  piperacillin-tazobactam (ZOSYN) IVPB 3.375 g        3.375 g 100 mL/hr over 30 Minutes Intravenous  Once 02/12/20 0859 02/12/20 0942        Objective:   Vitals:   02/14/20 1300 02/14/20 1400 02/14/20 1600 02/14/20 1616  BP:   (!) 145/105   Pulse:      Resp: (!) 24 20 17 17   Temp:    98.7 F (37.1 C)  TempSrc:    Oral    SpO2:      Weight:      Height:        Wt Readings from Last 3 Encounters:  02/14/20 61.1 kg  02/09/20 59 kg  01/29/20 61.1 kg     Intake/Output Summary (Last 24 hours) at 02/14/2020 1632 Last data filed at 02/14/2020 1517 Gross per 24 hour  Intake 2366.9 ml  Output 4250 ml  Net -1883.1 ml    Physical Exam  Gen:- awake and alert HEENT:-Extubated, ET tube and NG tube removed  lungs-improved air movement, Velcro type rales  CV- S1, S2 normal, regular  Abd-postop wound looks clean , positive bowel sounds  extremity/Skin: pedal pulses present  Psych-affect is appropriate, oriented x3 Neuro-no new focal deficits, no tremors MSK--Right femoral catheter site is clean dry and intact, -Left upper extremity arterial line in situ GU-Foley in situ--removed on 02/14/2020    Data Review:   Micro Results Recent Results (from the past 240 hour(s))  Respiratory Panel by RT PCR (Flu A&B, Covid) -     Status:  None   Collection Time: 02/09/20 10:05 PM   Specimen: Nasopharyngeal  Result Value Ref Range Status   SARS Coronavirus 2 by RT PCR NEGATIVE NEGATIVE Final    Comment: (NOTE) SARS-CoV-2 target nucleic acids are NOT DETECTED.  The SARS-CoV-2 RNA is generally detectable in upper respiratoy specimens during the acute phase of infection. The lowest concentration of SARS-CoV-2 viral copies this assay can detect is 131 copies/mL. A negative result does not preclude SARS-Cov-2 infection and should not be used as the sole basis for treatment or other patient management decisions. A negative result may occur with  improper specimen collection/handling, submission of specimen other than nasopharyngeal swab, presence of viral mutation(s) within the areas targeted by this assay, and inadequate number of viral copies (<131 copies/mL). A negative result must be combined with clinical observations, patient history, and epidemiological information. The expected result is Negative.  Fact  Sheet for Patients:  PinkCheek.be  Fact Sheet for Healthcare Providers:  GravelBags.it  This test is no t yet approved or cleared by the [place] FDA and  has been authorized for detection and/or diagnosis of SARS-CoV-2 by FDA under an Emergency Use Authorization (EUA). This EUA will remain  in effect (meaning this test can be used) for the duration of the COVID-19 declaration under Section 564(b)(1) of the Act, 21 U.S.C. section 360bbb-3(b)(1), unless the authorization is terminated or revoked sooner.     Influenza A by PCR NEGATIVE NEGATIVE Final   Influenza B by PCR NEGATIVE NEGATIVE Final    Comment: (NOTE) The Xpert Xpress SARS-CoV-2/FLU/RSV assay is intended as an aid in  the diagnosis of influenza from Nasopharyngeal swab specimens and  should not be used as a sole basis for treatment. Nasal washings and  aspirates are unacceptable for Xpert Xpress SARS-CoV-2/FLU/RSV  testing.  Fact Sheet for Patients: PinkCheek.be  Fact Sheet for Healthcare Providers: GravelBags.it  This test is not yet approved or cleared by the [place] FDA and  has been authorized for detection and/or diagnosis of SARS-CoV-2 by  FDA under an Emergency Use Authorization (EUA). This EUA will remain  in effect (meaning this test can be used) for the duration of the  Covid-19 declaration under Section 564(b)(1) of the Act, 21  U.S.C. section 360bbb-3(b)(1), unless the authorization is  terminated or revoked. Performed at [facility], [address] [place], [place] [address]   Urine Culture     Status: Abnormal   Collection Time: 02/12/20  4:49 AM   Specimen: Urine, Clean Catch  Result Value Ref Range Status   Specimen Description   Final    URINE, CLEAN CATCH Performed at [facility], [address] [place], [place] [address]    Special Requests   Final     NONE Performed at [facility], [address] [place], [place] [address]    Culture (A)  Final    <10,000 COLONIES/mL INSIGNIFICANT GROWTH Performed at [facility] [address], [place], [place] [address]    Report Status 02/13/2020 FINAL  Final  Respiratory Panel by RT PCR (Flu A&B, Covid) - Nasopharyngeal Swab     Status: None   Collection Time: 02/12/20 12:33 PM   Specimen: Nasopharyngeal Swab  Result Value Ref Range Status   SARS Coronavirus 2 by RT PCR NEGATIVE NEGATIVE Final    Comment: (NOTE) SARS-CoV-2 target nucleic acids are NOT DETECTED.  The SARS-CoV-2 RNA is generally detectable in upper respiratoy specimens during the acute phase of infection. The lowest concentration  of SARS-CoV-2 viral copies this assay can detect is 131 copies/mL. A negative result does not preclude SARS-Cov-2 infection and should not be used as the sole basis for treatment or other patient management decisions. A negative result may occur with  improper specimen collection/handling, submission of specimen other than nasopharyngeal swab, presence of viral mutation(s) within the areas targeted by this assay, and inadequate number of viral copies (<131 copies/mL). A negative result must be combined with clinical observations, patient history, and epidemiological information. The expected result is Negative.  Fact Sheet for Patients:  PinkCheek.be  Fact Sheet for Healthcare Providers:  GravelBags.it  This test is no t yet approved or cleared by the [place] FDA and  has been authorized for detection and/or diagnosis of SARS-CoV-2 by FDA under an Emergency Use Authorization (EUA). This EUA will remain  in effect (meaning this test can be used) for the duration of the COVID-19 declaration under Section 564(b)(1) of the Act, 21 U.S.C. section 360bbb-3(b)(1), unless the authorization is terminated or revoked sooner.      Influenza A by PCR NEGATIVE NEGATIVE Final   Influenza B by PCR NEGATIVE NEGATIVE Final    Comment: (NOTE) The Xpert Xpress SARS-CoV-2/FLU/RSV assay is intended as an aid in  the diagnosis of influenza from Nasopharyngeal swab specimens and  should not be used as a sole basis for treatment. Nasal washings and  aspirates are unacceptable for Xpert Xpress SARS-CoV-2/FLU/RSV  testing.  Fact Sheet for Patients: PinkCheek.be  Fact Sheet for Healthcare Providers: GravelBags.it  This test is not yet approved or cleared by the [place] FDA and  has been authorized for detection and/or diagnosis of SARS-CoV-2 by  FDA under an Emergency Use Authorization (EUA). This EUA will remain  in effect (meaning this test can be used) for the duration of the  Covid-19 declaration under Section 564(b)(1) of the Act, 21  U.S.C. section 360bbb-3(b)(1), unless the authorization is  terminated or revoked. Performed at [facility], [address] [place], [place] [address]   Surgical pcr screen     Status: None   Collection Time: 02/12/20  6:15 PM   Specimen: Nasal Mucosa; Nasal Swab  Result Value Ref Range Status   MRSA, PCR NEGATIVE NEGATIVE Final   Staphylococcus aureus NEGATIVE NEGATIVE Final    Comment: (NOTE) The Xpert SA Assay (FDA approved for NASAL specimens in patients 48 years of age and older), is one component of a comprehensive surveillance program. It is not intended to diagnose infection nor to guide or monitor treatment. Performed at [facility], [address], [place], [place] [address]     Radiology Reports CT ABDOMEN PELVIS W CONTRAST  Result Date: 02/10/2020 CLINICAL DATA:  Generalized abdominal pain. EXAM: CT ABDOMEN AND PELVIS WITH CONTRAST TECHNIQUE: Multidetector CT imaging of the abdomen and pelvis was performed using the standard protocol following bolus administration of intravenous contrast. CONTRAST:  11mL  OMNIPAQUE IOHEXOL 300 MG/ML  SOLN COMPARISON:  09/05/2018 FINDINGS: Lower chest: There is atelectasis at the lung bases.The heart is enlarged. Hepatobiliary: The liver is normal. The gallbladder is distended. There appears to be some mild gallbladder wall thickening and possible pericholecystic free fluid.There is no biliary ductal dilation. Pancreas: Normal contours without ductal dilatation. No peripancreatic fluid collection. Spleen: Unremarkable. Adrenals/Urinary Tract: --Adrenal glands: Unremarkable. --Right kidney/ureter: There is a punctate nonobstructing stone in lower pole the right kidney. --Left kidney/ureter: There is a nonobstructing stone in the upper pole the left kidney. --Urinary bladder: The urinary bladder is  distended. Stomach/Bowel: --Stomach/Duodenum: No hiatal hernia or other gastric abnormality. Normal duodenal course and caliber. --Small bowel: Unremarkable. --Colon: There is a large amount of stool in the right hemicolon. --Appendix: Normal. Vascular/Lymphatic: Normal course and caliber of the major abdominal vessels. --there are mildly enlarged retroperitoneal lymph nodes. --No mesenteric lymphadenopathy. --No pelvic or inguinal lymphadenopathy. Reproductive: The uterus is enlarged and heterogeneous with multiple fibroids. The largest fibroid measures approximately 8.1 cm. These have increased in size since the prior study. There is infiltration of the pelvic fat with a small amount of pelvic free fluid. Other: No ascites or free air. The abdominal wall is normal. Musculoskeletal. No acute displaced fractures. IMPRESSION: 1. The gallbladder is distended with some mild gallbladder wall thickening and possible pericholecystic free fluid. Multiple gallstones are noted. If there is concern for acute cholecystitis, recommend further evaluation with right upper quadrant ultrasound. 2. Large amount of stool in the right hemicolon. 3. Fibroid uterus, increased in size since the prior study. There  is infiltration of the pelvic fat with a small amount of pelvic free fluid. This is of unknown clinical significance. Consider pelvic inflammatory disease in the appropriate clinical setting. 4. Cardiomegaly. 5. Bilateral nonobstructive nephrolithiasis. 6. Distended urinary bladder. Electronically Signed   By: Constance Holster M.D.   On: 02/10/2020 01:57   DG CHEST PORT 1 VIEW  Result Date: 02/14/2020 CLINICAL DATA:  Acute respiratory failure due to flash pulmonary edema. EXAM: PORTABLE CHEST 1 VIEW COMPARISON:  02/13/2020; 01/31/2019; chest CT-06/12/2019 FINDINGS: Grossly unchanged cardiac silhouette and mediastinal contours. Stable position of endotracheal tube with tip overlying the tracheal air column, superior to the carina. Interval placement of enteric tube with tip and side port projecting over the expected location of the gastric fundus. Improved aeration of the lungs with trace residual effusions and associated bibasilar opacities. No new focal airspace opacities. No pneumothorax. No acute osseous abnormalities. IMPRESSION: 1. Interval placement of enteric tube with tip and side port projecting below the left hemidiaphragm. 2. Otherwise, stable positioning of support apparatus. No pneumothorax. 3. Improved aeration of the lungs suggests resolving edema and atelectasis. 4. Trace residual bilateral effusions and associated bibasilar opacities, likely atelectasis. Electronically Signed   By: Sandi Mariscal M.D.   On: 02/14/2020 12:02   DG Chest Port 1 View  Result Date: 02/13/2020 CLINICAL DATA:  Post-op aspiration. History of interstitial lung disease. EXAM: PORTABLE CHEST 1 VIEW COMPARISON:  Radiographs 01/30/2019 and 07/13/2017. CT 06/11/2019 and 02/10/2020. FINDINGS: 1302 hours. There are new extensive bilateral airspace opacities with a basilar predominance, slightly worse on the right. There are possible bilateral pleural effusions. No pneumothorax. The heart and mediastinum are partially  obscured by the airspace opacities. Mild cardiomegaly appears grossly unchanged. The stomach is distended. There are postsurgical changes in the upper abdomen. No acute osseous findings. IMPRESSION: Extensive bilateral airspace opacities consistent with aspiration or multilobar pneumonia. Possible bilateral pleural effusions. Electronically Signed   By: Richardean Sale M.D.   On: 02/13/2020 13:37   DG Chest Port 1V same Day  Result Date: 02/13/2020 CLINICAL DATA:  Status post intubation. EXAM: PORTABLE CHEST 1 VIEW COMPARISON:  February 13, 2020. FINDINGS: Stable cardiomediastinal silhouette. Endotracheal tube is in good position. Stable bilateral lung opacities are noted concerning for multifocal pneumonia. No pneumothorax is noted. Bilateral pleural effusions are noted. Bony thorax is unremarkable. IMPRESSION: Endotracheal tube in good position. Stable bilateral lung opacities are noted concerning for multifocal pneumonia. Bilateral pleural effusions are noted. Electronically Signed   By: Jeneen Rinks  Murlean Caller M.D.   On: 02/13/2020 14:10   ECHOCARDIOGRAM COMPLETE  Result Date: 02/13/2020    ECHOCARDIOGRAM REPORT   Patient Name:   Karen [name] Date of Exam: 02/13/2020 Medical Rec #:  [id number]       Height:       65.0 in Accession #:    [id number]      Weight:       130.1 lb Date of Birth:  1971-02-11        BSA:          1.648 m Patient Age:    17 years        BP:           153/102 mmHg Patient Gender: F               HR:           100 bpm. Exam Location:  [facility] Procedure: 2D Echo, Cardiac Doppler and Color Doppler STAT ECHO Indications:    Cardiac arrest I46.9  History:        Patient has prior history of Echocardiogram examinations, most                 recent 11/18/2018. Risk Factors:Hypertension. ILD (interstitial                 lung disease).  Sonographer:    Alvino Chapel RCS Referring Phys: [phone] [place]  1. Left ventricular ejection fraction, by estimation, is 65 to 70%. The left  ventricle has normal function. The left ventricle has no regional wall motion abnormalities. There is moderate left ventricular hypertrophy. Left ventricular diastolic parameters are indeterminate.  2. Right ventricular systolic function is low normal. The right ventricular size is normal. Tricuspid regurgitation signal is inadequate for assessing PA pressure.  3. The mitral valve is grossly normal. Trivial mitral valve regurgitation.  4. The aortic valve is grossly normal, not completely visualized. Aortic valve regurgitation is trivial.  5. No pericardial effusion. FINDINGS  Left Ventricle: Left ventricular ejection fraction, by estimation, is 65 to 70%. The left ventricle has normal function. The left ventricle has no regional wall motion abnormalities. The left ventricular internal cavity size was normal in size. There is  moderate left ventricular hypertrophy. Left ventricular diastolic parameters are indeterminate. Right Ventricle: The right ventricular size is normal. No increase in right ventricular wall thickness. Right ventricular systolic function is low normal. Tricuspid regurgitation signal is inadequate for assessing PA pressure. Left Atrium: Left atrial size was normal in size. Right Atrium: Right atrial size was normal in size. Pericardium: There is no evidence of pericardial effusion. Mitral Valve: The mitral valve is grossly normal. Trivial mitral valve regurgitation. Tricuspid Valve: The tricuspid valve is grossly normal. Tricuspid valve regurgitation is mild. Aortic Valve: The aortic valve is grossly normal. Aortic valve regurgitation is trivial. Pulmonic Valve: The pulmonic valve was not assessed. Pulmonic valve regurgitation is not visualized. Aorta: The aortic root is normal in size and structure. IAS/Shunts: The interatrial septum was not assessed.  LEFT VENTRICLE PLAX 2D LVIDd:         3.07 cm LVIDs:         2.17 cm LV PW:         1.42 cm LV IVS:        1.59 cm LVOT diam:     1.60 cm LV SV:          30 LV SV Index:   18 LVOT Area:  2.01 cm  RIGHT VENTRICLE RV S prime:     9.25 cm/s TAPSE (M-mode): 1.9 cm LEFT ATRIUM         Index LA diam:    2.30 cm 1.40 cm/m  AORTIC VALVE LVOT Vmax:   81.80 cm/s LVOT Vmean:  61.200 cm/s LVOT VTI:    0.148 m  AORTA Ao Root diam: 3.10 cm MITRAL VALVE               TRICUSPID VALVE MV Area (PHT): 7.16 cm    TR Peak grad:   23.6 mmHg MV Decel Time: 106 msec    TR Vmax:        243.00 cm/s MV E velocity: 83.60 cm/s                            SHUNTS                            Systemic VTI:  0.15 m                            Systemic Diam: 1.60 cm Rozann Lesches MD Electronically signed by Rozann Lesches MD Signature Date/Time: 02/13/2020/2:11:06 PM    Final    US Abdomen Limited RUQ  Result Date: 02/12/2020 CLINICAL DATA:  3 day history of right upper quadrant pain. EXAM: ULTRASOUND ABDOMEN LIMITED RIGHT UPPER QUADRANT COMPARISON:  Abdomen/pelvis CT 02/10/2020 FINDINGS: Gallbladder: As on recent CT scan, multiple calcified gallstones evident. Shadowing makes measurement of the stones difficult but these probably measure up to at least 2.8 cm. Gallbladder wall is thickened at 4 mm. Sonographer reports a positive sonographic Murphy sign. Common bile duct: Diameter: 4-5 mm Liver: No focal lesion identified. Within normal limits in parenchymal echogenicity. Portal vein is patent on color Doppler imaging with normal direction of blood flow towards the liver. Other: None. IMPRESSION: Cholelithiasis with gallbladder wall thickening and positive sonographic Murphy sign. Imaging features consistent with acute cholecystitis. Electronically Signed   By: Misty Stanley M.D.   On: 02/12/2020 08:08     CBC Recent Labs  Lab 02/09/20 2352 02/12/20 0230 02/13/20 1639 02/14/20 0604  WBC 11.6* 8.4 8.2 13.5*  HGB 12.3 11.4* 12.8 10.5*  HCT 39.7 37.1 41.4 33.0*  PLT 220 215 258 248  MCV 87.8 89.4 87.5 86.2  MCH 27.2 27.5 27.1 27.4  MCHC 31.0 30.7 30.9 31.8  RDW 13.8 13.7  13.2 13.2  LYMPHSABS 1.3 1.2  --   --   MONOABS 1.2* 1.0  --   --   EOSABS 0.0 0.2  --   --   BASOSABS 0.0 0.0  --   --     Chemistries  Recent Labs  Lab 02/09/20 2352 02/12/20 0230 02/13/20 1324 02/14/20 0604  NA 134* 135 137 137  K 3.2* 3.3* 3.6 3.2*  CL 101 103 103 99  CO2 22 26 23 27   GLUCOSE 107* 114* 247* 136*  BUN 9 8 5* 6  CREATININE 0.53 0.61 0.66 0.79  CALCIUM 10.4* 9.6 9.0 9.2  AST 18 16 61* 53*  ALT 11 10 43 54*  ALKPHOS 66 57 57 48  BILITOT 1.1 0.5 0.3 0.6   ------------------------------------------------------------------------------------------------------------------ Recent Labs    02/14/20 0607  TRIG 135    No results found for: HGBA1C ------------------------------------------------------------------------------------------------------------------ No results for input(s): TSH, T4TOTAL, T3FREE, THYROIDAB in  the last 72 hours.  Invalid input(s): FREET3 ------------------------------------------------------------------------------------------------------------------ No results for input(s): VITAMINB12, FOLATE, FERRITIN, TIBC, IRON, RETICCTPCT in the last 72 hours.  Coagulation profile No results for input(s): INR, PROTIME in the last 168 hours.  Recent Labs    02/13/20 1324  DDIMER 8.03*    Cardiac Enzymes No results for input(s): CKMB, TROPONINI, MYOGLOBIN in the last 168 hours.  Invalid input(s): CK ------------------------------------------------------------------------------------------------------------------    Component Value Date/Time   BNP 199.4 (H) 01/30/2019 1416    Roxan Hockey M.D on 02/14/2020 at 4:32 PM  Go to [email or web address] - for contact info  [facility] - Office  [phone]

## 2020-02-14 NOTE — Progress Notes (Signed)
Patient performed -40 NIF and FVC 1.1L. RT extubated patient to 4L O2 via nasal cannula. Patient HR 99, RR 17 and SATs 100%.BBS diminished. RN at bedside, no complications noted.RT will continue to monitor and assess.

## 2020-02-14 NOTE — Progress Notes (Signed)
1 Day Post-Op  Subjective: Patient on ventilator and sedated.  Arousable.  Objective: Vital signs in last 24 hours: Temp:  [98 F (36.7 C)-98.9 F (37.2 C)] 98.9 F (37.2 C) (10/09 1118) Pulse Rate:  [76-99] 95 (10/09 0900) Resp:  [12-24] 13 (10/09 1118) BP: (100-197)/(71-140) 105/81 (10/09 1100) SpO2:  [97 %-100 %] 100 % (10/09 1100) Arterial Line BP: (85-231)/(56-140) 115/75 (10/09 1100) FiO2 (%):  [35 %-40 %] 35 % (10/09 1100) Weight:  [61.1 kg] 61.1 kg (10/09 0446) Last BM Date: 02/11/20  Intake/Output from previous day: 10/08 0701 - 10/09 0700 In: 3279.4 [I.V.:3194.6; IV Piggyback:84.8] Out: 4400 [Urine:4400] Intake/Output this shift: Total I/O In: 866.2 [I.V.:528.1; IV Piggyback:338.2] Out: 1250 [Urine:1250]  General appearance: Intubated Resp: clear to auscultation bilaterally Cardio: regular rate and rhythm, S1, S2 normal, no murmur, click, rub or gallop GI: Soft, dressings dry and intact.  Lab Results:  Recent Labs    02/13/20 1639 02/14/20 0604  WBC 8.2 13.5*  HGB 12.8 10.5*  HCT 41.4 33.0*  PLT 258 248   BMET Recent Labs    02/13/20 1324 02/14/20 0604  NA 137 137  K 3.6 3.2*  CL 103 99  CO2 23 27  GLUCOSE 247* 136*  BUN 5* 6  CREATININE 0.66 0.79  CALCIUM 9.0 9.2   PT/INR No results for input(s): LABPROT, INR in the last 72 hours.  Studies/Results: DG CHEST PORT 1 VIEW  Result Date: 02/14/2020 CLINICAL DATA:  Acute respiratory failure due to flash pulmonary edema. EXAM: PORTABLE CHEST 1 VIEW COMPARISON:  02/13/2020; 01/31/2019; chest CT-06/12/2019 FINDINGS: Grossly unchanged cardiac silhouette and mediastinal contours. Stable position of endotracheal tube with tip overlying the tracheal air column, superior to the carina. Interval placement of enteric tube with tip and side port projecting over the expected location of the gastric fundus. Improved aeration of the lungs with trace residual effusions and associated bibasilar opacities. No new  focal airspace opacities. No pneumothorax. No acute osseous abnormalities. IMPRESSION: 1. Interval placement of enteric tube with tip and side port projecting below the left hemidiaphragm. 2. Otherwise, stable positioning of support apparatus. No pneumothorax. 3. Improved aeration of the lungs suggests resolving edema and atelectasis. 4. Trace residual bilateral effusions and associated bibasilar opacities, likely atelectasis. Electronically Signed   By: Sandi Mariscal M.D.   On: 02/14/2020 12:02   DG Chest Port 1 View  Result Date: 02/13/2020 CLINICAL DATA:  Post-op aspiration. History of interstitial lung disease. EXAM: PORTABLE CHEST 1 VIEW COMPARISON:  Radiographs 01/30/2019 and 07/13/2017. CT 06/11/2019 and 02/10/2020. FINDINGS: 1302 hours. There are new extensive bilateral airspace opacities with a basilar predominance, slightly worse on the right. There are possible bilateral pleural effusions. No pneumothorax. The heart and mediastinum are partially obscured by the airspace opacities. Mild cardiomegaly appears grossly unchanged. The stomach is distended. There are postsurgical changes in the upper abdomen. No acute osseous findings. IMPRESSION: Extensive bilateral airspace opacities consistent with aspiration or multilobar pneumonia. Possible bilateral pleural effusions. Electronically Signed   By: Richardean Sale M.D.   On: 02/13/2020 13:37   DG Chest Port 1V same Day  Result Date: 02/13/2020 CLINICAL DATA:  Status post intubation. EXAM: PORTABLE CHEST 1 VIEW COMPARISON:  February 13, 2020. FINDINGS: Stable cardiomediastinal silhouette. Endotracheal tube is in good position. Stable bilateral lung opacities are noted concerning for multifocal pneumonia. No pneumothorax is noted. Bilateral pleural effusions are noted. Bony thorax is unremarkable. IMPRESSION: Endotracheal tube in good position. Stable bilateral lung opacities are noted concerning for  multifocal pneumonia. Bilateral pleural effusions are  noted. Electronically Signed   By: Marijo Conception M.D.   On: 02/13/2020 14:10   ECHOCARDIOGRAM COMPLETE  Result Date: 02/13/2020    ECHOCARDIOGRAM REPORT   Patient Name:   Karen [name] Date of Exam: 02/13/2020 Medical Rec #:  [id number]       Height:       65.0 in Accession #:    [id number]      Weight:       130.1 lb Date of Birth:  June 04, 1970        BSA:          1.648 m Patient Age:    49 years        BP:           153/102 mmHg Patient Gender: F               HR:           100 bpm. Exam Location:  [facility] Procedure: 2D Echo, Cardiac Doppler and Color Doppler STAT ECHO Indications:    Cardiac arrest I46.9  History:        Patient has prior history of Echocardiogram examinations, most                 recent 11/18/2018. Risk Factors:Hypertension. ILD (interstitial                 lung disease).  Sonographer:    Alvino Chapel RCS Referring Phys: [id number] [place]  1. Left ventricular ejection fraction, by estimation, is 65 to 70%. The left ventricle has normal function. The left ventricle has no regional wall motion abnormalities. There is moderate left ventricular hypertrophy. Left ventricular diastolic parameters are indeterminate.  2. Right ventricular systolic function is low normal. The right ventricular size is normal. Tricuspid regurgitation signal is inadequate for assessing PA pressure.  3. The mitral valve is grossly normal. Trivial mitral valve regurgitation.  4. The aortic valve is grossly normal, not completely visualized. Aortic valve regurgitation is trivial.  5. No pericardial effusion. FINDINGS  Left Ventricle: Left ventricular ejection fraction, by estimation, is 65 to 70%. The left ventricle has normal function. The left ventricle has no regional wall motion abnormalities. The left ventricular internal cavity size was normal in size. There is  moderate left ventricular hypertrophy. Left ventricular diastolic parameters are indeterminate. Right Ventricle: The right ventricular  size is normal. No increase in right ventricular wall thickness. Right ventricular systolic function is low normal. Tricuspid regurgitation signal is inadequate for assessing PA pressure. Left Atrium: Left atrial size was normal in size. Right Atrium: Right atrial size was normal in size. Pericardium: There is no evidence of pericardial effusion. Mitral Valve: The mitral valve is grossly normal. Trivial mitral valve regurgitation. Tricuspid Valve: The tricuspid valve is grossly normal. Tricuspid valve regurgitation is mild. Aortic Valve: The aortic valve is grossly normal. Aortic valve regurgitation is trivial. Pulmonic Valve: The pulmonic valve was not assessed. Pulmonic valve regurgitation is not visualized. Aorta: The aortic root is normal in size and structure. IAS/Shunts: The interatrial septum was not assessed.  LEFT VENTRICLE PLAX 2D LVIDd:         3.07 cm LVIDs:         2.17 cm LV PW:         1.42 cm LV IVS:        1.59 cm LVOT diam:     1.60 cm LV SV:  30 LV SV Index:   18 LVOT Area:     2.01 cm  RIGHT VENTRICLE RV S prime:     9.25 cm/s TAPSE (M-mode): 1.9 cm LEFT ATRIUM         Index LA diam:    2.30 cm 1.40 cm/m  AORTIC VALVE LVOT Vmax:   81.80 cm/s LVOT Vmean:  61.200 cm/s LVOT VTI:    0.148 m  AORTA Ao Root diam: 3.10 cm MITRAL VALVE               TRICUSPID VALVE MV Area (PHT): 7.16 cm    TR Peak grad:   23.6 mmHg MV Decel Time: 106 msec    TR Vmax:        243.00 cm/s MV E velocity: 83.60 cm/s                            SHUNTS                            Systemic VTI:  0.15 m                            Systemic Diam: 1.60 cm Rozann Lesches MD Electronically signed by Rozann Lesches MD Signature Date/Time: 02/13/2020/2:11:06 PM    Final     Anti-infectives: Anti-infectives (From admission, onward)   Start     Dose/Rate Route Frequency Ordered Stop   02/12/20 1630  sulfamethoxazole-trimethoprim (BACTRIM DS) 800-160 MG per tablet 1 tablet  Status:  Discontinued        1 tablet Oral Daily  02/12/20 1627 02/13/20 1542   02/12/20 0930  cefTRIAXone (ROCEPHIN) 2 g in sodium chloride 0.9 % 100 mL IVPB        2 g 200 mL/hr over 30 Minutes Intravenous Every 24 hours 02/12/20 0923     02/12/20 0900  piperacillin-tazobactam (ZOSYN) IVPB 3.375 g        3.375 g 100 mL/hr over 30 Minutes Intravenous  Once 02/12/20 0859 02/12/20 0942      Assessment/Plan: s/p Procedure(s): LAPAROSCOPIC CHOLECYSTECTOMY Impression: Is starting to wean off the ventilator, status post postoperative reintubation for flash pulmonary edema.  Liver tests look good.  Hopefully will be extubated in the next 24 hours.  LOS: 2 days    Aviva Signs 02/14/2020

## 2020-02-14 NOTE — Progress Notes (Signed)
Decreased rate to 12 after blood gas. Hopefully she can be extubated in am.

## 2020-02-14 NOTE — Progress Notes (Signed)
Per Dr Joesph Fillers I have decreased the patients propofol dose gradually this am. She is currently at 20 mcg. Patient is tolerating the decrease is sedation well. Plan to extubate later this afternoon. Will continue to monitor.

## 2020-02-15 ENCOUNTER — Inpatient Hospital Stay (HOSPITAL_COMMUNITY): Payer: 59

## 2020-02-15 DIAGNOSIS — J9601 Acute respiratory failure with hypoxia: Secondary | ICD-10-CM | POA: Diagnosis not present

## 2020-02-15 LAB — COMPREHENSIVE METABOLIC PANEL
ALT: 50 U/L — ABNORMAL HIGH (ref 0–44)
AST: 42 U/L — ABNORMAL HIGH (ref 15–41)
Albumin: 3.4 g/dL — ABNORMAL LOW (ref 3.5–5.0)
Alkaline Phosphatase: 53 U/L (ref 38–126)
Anion gap: 11 (ref 5–15)
BUN: 6 mg/dL (ref 6–20)
CO2: 30 mmol/L (ref 22–32)
Calcium: 9.6 mg/dL (ref 8.9–10.3)
Chloride: 97 mmol/L — ABNORMAL LOW (ref 98–111)
Creatinine, Ser: 0.61 mg/dL (ref 0.44–1.00)
GFR, Estimated: >60 mL/min (ref >60)
Glucose, Bld: 98 mg/dL (ref 70–99)
Potassium: 3.3 mmol/L — ABNORMAL LOW (ref 3.5–5.1)
Sodium: 138 mmol/L (ref 135–145)
Total Bilirubin: 0.6 mg/dL (ref 0.3–1.2)
Total Protein: 7.8 g/dL (ref 6.5–8.1)

## 2020-02-15 LAB — CBC
HCT: 35.9 % — ABNORMAL LOW (ref 36.0–46.0)
Hemoglobin: 10.9 g/dL — ABNORMAL LOW (ref 12.0–15.0)
MCH: 26.8 pg (ref 26.0–34.0)
MCHC: 30.4 g/dL (ref 30.0–36.0)
MCV: 88.2 fL (ref 80.0–100.0)
Platelets: 241 10*3/uL (ref 150–400)
RBC: 4.07 MIL/uL (ref 3.87–5.11)
RDW: 13.4 % (ref 11.5–15.5)
WBC: 10.3 10*3/uL (ref 4.0–10.5)
nRBC: 0 % (ref 0.0–0.2)

## 2020-02-15 LAB — GLUCOSE, CAPILLARY
Glucose-Capillary: 114 mg/dL — ABNORMAL HIGH (ref 70–99)
Glucose-Capillary: 90 mg/dL (ref 70–99)
Glucose-Capillary: 92 mg/dL (ref 70–99)

## 2020-02-15 LAB — MAGNESIUM: Magnesium: 1.9 mg/dL (ref 1.7–2.4)

## 2020-02-15 MED ORDER — OXYCODONE HCL 5 MG PO TABS
5.0000 mg | ORAL_TABLET | Freq: Four times a day (QID) | ORAL | 0 refills | Status: DC | PRN
Start: 2020-02-15 — End: 2020-03-11

## 2020-02-15 MED ORDER — POTASSIUM CHLORIDE CRYS ER 20 MEQ PO TBCR
40.0000 meq | EXTENDED_RELEASE_TABLET | Freq: Once | ORAL | Status: AC
  Administered 2020-02-15: 40 meq via ORAL
  Filled 2020-02-15: qty 2

## 2020-02-15 MED ORDER — ONDANSETRON HCL 4 MG PO TABS: 4.0000 mg | ORAL_TABLET | Freq: Three times a day (TID) | ORAL | 0 refills | Status: DC | PRN

## 2020-02-15 MED ORDER — ACETAMINOPHEN 325 MG PO TABS: 650.0000 mg | ORAL_TABLET | Freq: Four times a day (QID) | ORAL | 0 refills | Status: AC | PRN

## 2020-02-15 MED ORDER — ADULT MULTIVITAMIN LIQUID CH: 15.0000 mL | Freq: Every day | ORAL | Status: DC

## 2020-02-15 MED ORDER — GUAIFENESIN-DM 100-10 MG/5ML PO SYRP
5.0000 mL | ORAL_SOLUTION | ORAL | Status: DC | PRN
  Administered 2020-02-15: 5 mL via ORAL
  Filled 2020-02-15: qty 5

## 2020-02-15 MED ORDER — SENNOSIDES-DOCUSATE SODIUM 8.6-50 MG PO TABS
2.0000 | ORAL_TABLET | Freq: Every day | ORAL | 1 refills | Status: DC
Start: 2020-02-15 — End: 2020-12-16

## 2020-02-15 MED ORDER — POLYETHYLENE GLYCOL 3350 17 G PO PACK: 17.0000 g | PACK | Freq: Every day | ORAL | 0 refills | Status: DC

## 2020-02-15 MED ORDER — AMLODIPINE BESYLATE 5 MG PO TABS: 10.0000 mg | ORAL_TABLET | Freq: Every day | ORAL | Status: DC

## 2020-02-15 MED ORDER — BISACODYL 10 MG RE SUPP
10.0000 mg | Freq: Once | RECTAL | Status: AC
  Administered 2020-02-15: 10 mg via RECTAL
  Filled 2020-02-15: qty 1

## 2020-02-15 MED ORDER — GUAIFENESIN-DM 100-10 MG/5ML PO SYRP: 5.0000 mL | ORAL_SOLUTION | ORAL | Status: DC | PRN

## 2020-02-15 NOTE — Progress Notes (Signed)
Nsg Discharge Note  Admit Date:  02/12/2020 Discharge date: 02/15/2020   Karen [name] to be D/C'd Home per MD order.  AVS completed.  Copy for chart, and copy for patient signed, and dated. Patient/caregiver able to verbalize understanding.  Discharge Medication: Allergies as of 02/15/2020      Reactions   Lisinopril Hives, Swelling      Medication List    TAKE these medications   acetaminophen 325 MG tablet Commonly known as: TYLENOL Take 2 tablets (650 mg total) by mouth every 6 (six) hours as needed for mild pain, fever or headache (or Fever >/= 101).   azelastine 0.05 % ophthalmic solution Commonly known as: OPTIVAR Place 1 drop into both eyes daily as needed (allergies).   bisoprolol 5 MG tablet Commonly known as: ZEBETA Take 1 tablet (5 mg total) by mouth daily.   esomeprazole 40 MG capsule Commonly known as: NEXIUM TAKE 1 CAPSULE BY MOUTH TWO TIMES DAILY What changed: See the new instructions.   furosemide 40 MG tablet Commonly known as: LASIX Take 40 mg by mouth as needed for edema. Except Saturdays and Sundays   macitentan 10 MG tablet Commonly known as: OPSUMIT Take 1 tablet (10 mg total) by mouth daily.   Mega MultiVitamin Powd Take 1 Scoop by mouth daily. Mix 1 scoop in water   mycophenolate 500 MG tablet Commonly known as: CELLCEPT TAKE 3 TABLETS BY MOUTH IN THE MORNING AND 3 TABLETS IN THE EVENING What changed:   how much to take  how to take this  when to take this  additional instructions   NP Thyroid 90 MG tablet Generic drug: thyroid Take 1 tablet (90 mg total) by mouth daily. What changed: Another medication with the same name was removed. Continue taking this medication, and follow the directions you see here.   Ofev 150 MG Caps Generic drug: Nintedanib Take 1 capsule (150 mg total) by mouth 2 (two) times daily.   ondansetron 4 MG tablet Commonly known as: Zofran Take 1 tablet (4 mg total) by mouth every 8 (eight) hours as  needed for nausea or vomiting.   oxyCODONE 5 MG immediate release tablet Commonly known as: Oxy IR/ROXICODONE Take 1 tablet (5 mg total) by mouth every 6 (six) hours as needed for moderate pain or severe pain. What changed:   when to take this  reasons to take this   polyethylene glycol 17 g packet Commonly known as: MIRALAX / GLYCOLAX Take 17 g by mouth daily. Start taking on: February 16, 2020   potassium chloride 20 MEQ/15ML (10%) Soln Take 20 mEq by mouth as needed (with Lasix for Edema). Except on saturdays and sundays   prednisoLONE acetate 1 % ophthalmic suspension Commonly known as: PRED FORTE Place 1 drop into both eyes as needed (irritation).   senna-docusate 8.6-50 MG tablet Commonly known as: Senokot-S Take 2 tablets by mouth at bedtime.   spironolactone 25 MG tablet Commonly known as: ALDACTONE TAKE 1 TABLET BY MOUTH DAILY.   sulfamethoxazole-trimethoprim 800-160 MG tablet Commonly known as: BACTRIM DS Take 1 tablet by mouth daily.   Vitamin D-3 125 MCG (5000 UT) Tabs Take 10,000 Units by mouth daily.            Discharge Care Instructions  (From admission, onward)         Start     Ordered   02/15/20 0000  Discharge wound care:       Comments: As advised by general surgeon   02/15/20  1059          Discharge Assessment: Vitals:   02/15/20 1000 02/15/20 1100  BP: (!) 147/97 (!) 143/97  Pulse:    Resp: (!) 22 (!) 23  Temp:    SpO2:     Skin clean, dry and intact without evidence of skin break down, no evidence of skin tears noted. IV catheter discontinued intact. Site without signs and symptoms of complications - no redness or edema noted at insertion site, patient denies c/o pain - only slight tenderness at site.  Dressing with slight pressure applied.  D/c Instructions-Education: Discharge instructions given to patient/family with verbalized understanding. D/c education completed with patient/family including follow up instructions,  medication list, d/c activities limitations if indicated, with other d/c instructions as indicated by MD - patient able to verbalize understanding, all questions fully answered. Patient instructed to return to ED, call 911, or call MD for any changes in condition.  Patient escorted via [place], and D/C home via private auto.  Carney Corners, RN 02/15/2020 1:23 PM

## 2020-02-15 NOTE — Discharge Summary (Signed)
Karen [name], is a 49 y.o. female  DOB 12-24-1970  MRN [id number].  Admission date:  02/12/2020  Admitting Physician  Roxan Hockey, MD  Discharge Date:  02/15/2020   Primary MD  [name], MD  Recommendations for primary care physician for things to follow:   1) avoid constipation 2) follow-up with general surgeon Dr. Arnoldo Morale in a couple weeks as advised 3) follow-up with your pulmonologist Dr. Marshell Garfinkel as previously scheduled  Admission Diagnosis  Acute cholecystitis [K81.0] Nausea [R11.0] Abdominal pain [R10.9]   Discharge Diagnosis  Acute cholecystitis [K81.0] Nausea [R11.0] Abdominal pain [R10.9]    Principal Problem:   Acute respiratory failure with hypoxia due to flash pulmonary edema requiring intubation Active Problems:   ILD (interstitial lung disease) ([place])   Acute calculus cholecystitis   HTN (hypertension)   Calculus of gallbladder with acute cholecystitis without obstruction   Status post laparoscopic cholecystectomy 02/13/20   Sjogren's syndrome ([place])   GERD (gastroesophageal reflux disease)   Constipation   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 GERD (gastroesophageal reflux disease)   . Hypertension   . Hypothyroidism   . ILD (interstitial lung disease) ([place]) 02/08/2018  . Interstitial lung disease ([place])   . Multinodular thyroid    benign FNA 08/2017.  Marland Kitchen Perimenopause 02/27/2019  . Scleroderma ([place])   . Vitamin D deficiency disease 02/27/2019    Past Surgical History:  Procedure Laterality Date  . ABDOMINAL HERNIA REPAIR    . BUNIONECTOMY Bilateral 10/2018  . COLONOSCOPY WITH PROPOFOL N/A 01/02/2019   Procedure: COLONOSCOPY WITH PROPOFOL;  Surgeon: Daneil Dolin, MD;  [facility];  Service: Endoscopy;  Laterality: N/A;  12:30pm  . ESOPHAGOGASTRODUODENOSCOPY  (EGD) WITH PROPOFOL N/A 01/28/2018   erosive reflux esophagitis, patulous EG Junction, no dilation, incomplete EGD due to retained food in stomach. GES thereafter with delayed gastric emptying.   Marland Kitchen RIGHT HEART CATH N/A 09/27/2017   Procedure: RIGHT HEART CATH;  Surgeon: Jolaine Artist, MD;  Location: [facility];  Service: Cardiovascular;  Laterality: N/A;  . RIGHT HEART CATH N/A 09/05/2019   Procedure: RIGHT HEART CATH;  Surgeon: Jolaine Artist, MD;  Location: [facility];  Service: Cardiovascular;  Laterality: N/A;  . UTERINE FIBROID SURGERY       HPI  from the history and physical done on the day of admission:   Karen [name]  is a 49 y.o. female with past medical history relevant for scleroderma, Sjogren's syndrome, interstitial lung disease/PAH, GERD and HTN as well as hypothyroidism who presents to the ED with recurrent episodes of abdominal pain associated with nausea and vomiting  --Seen in the ED on February 09, 2020 CT abdomen and pelvis at that time showed gallstones with possible gallbladder distention, patient at that time had fever and chills -She continues to have pain and nausea, she had a phone visit with her primary care physician  at the [facility]--- she was prescribed narcotics and antiemetics--- however symptoms persisted worsened --She returned to the ED for evaluation on February 11, 2020--gallbladder ultrasound is consistent with acute calculus cholecystitis --EDP discussed case with general surgeon who requested hospitalization for possible lap chole - Patient is presenting with acute calculus cholecystitis requiring iv pain control, IV fluids antiemetics and surgical intervention--PTA patient failed oral antiemetics and also failed oral narcotics  -Recent abdominal imaging consistent with constipation -UA suggestive of UTI -CMP unremarkable except for potassium of 3.3 -Lipase is 20 -WBC is 8.4 -Abdominal ultrasound impression-:-Cholelithiasis with  gallbladder wall thickening and positive sonographic Murphy sign. Imaging features consistent with acute cholecystitis    Hospital Course:     Brief Summary:- 49 y.o.femalewith past medical history relevant for scleroderma, Sjogren's syndrome, interstitial lung disease/PAH, GERD and HTN as well as hypothyroidism ---admitted on 02/12/2020 with Acute calculus cholecystitis, underwent lap chole on 02/13/2020 -Shortly postop on 02/13/20 patient developed shortness of breath  with frothy sputum from the mouth and clinical exam and radiological studies consistent with flash pulmonary edema with hypoxic and hypercapnic respiratory failure---  patient reintubated in PACU, discussed with Dr. Arnoldo Morale general surgeon, discussed with anesthesiologist at bedside, -Pulmonary consult from Dr. Melvyn Novas appreciated  -preliminary echo report with preserved EF and no significant wall motion abnormalities -Successfully extubated on 02/14/2020 after diuresis  A/p 1)Acute Hypoxic and Hypercapnic Respiratory Failure----suspect secondary to flash pulmonary edema-- --Intubated 02/13/2020 --Extubated 02/14/2020 -Clinical exam and radiological findings significantly improved after IV diuresis -Dyspnea and hypoxia resolved  -Patient maintaining good O2 sats even post ambulation on room air  2)ILD/PAH---patient follows with Dr. Roderic Palau and [facility] -Pulmonology consult from Dr. Melvyn Novas appreciated  3)Scleroderma/Sjogren's Syndrome---continue CellCept,continue Bactrim for prophylaxis of opportunistic infection  4)HTN-stable, continue bisoprolol 5 mg daily,    5)hypothyroidism--- resume levothyroxine   6)chronic constipation---laxatives as ordered  7)possible UTI--- treated with IV Rocephin, urine culture without significant growth  8)Acute calculus cholecystitis--- lap chole on 02/13/2020 -Postop management per general surgeon -Tolerating solid diet well  02/14/20 --Removed NG  tube -Removed Foley catheter 02/14/20-extubated  02/15/20--Rt femoral catheter removed   Disposition--- discharge home  Disposition: The patient is from: Home  Anticipated d/c is to: Home    Code Status : Full code  Procedures:- -Intubated 02/13/2020 --Extubated 02/14/2020 -Lap chole 02/13/2020  Family Communication:  Discussed with pt's mother   Consults  :  PCCM/Gen surgery  Discharge Condition: stable  Follow UP--- Dr. Arnoldo Morale the general surgeon in 2 weeks  Diet and Activity recommendation:  As advised  Discharge Instructions    Discharge Instructions    Call MD for:  difficulty breathing, headache or visual disturbances   Complete by: As directed    Call MD for:  persistant dizziness or light-headedness   Complete by: As directed    Call MD for:  persistant nausea and vomiting   Complete by: As directed    Call MD for:  redness, tenderness, or signs of infection (pain, swelling, redness, odor or green/yellow discharge around incision site)   Complete by: As directed    Call MD for:  severe uncontrolled pain   Complete by: As directed    Call MD for:  temperature >100.4   Complete by: As directed    Diet - low sodium heart healthy   Complete by: As directed    Discharge instructions   Complete by: As directed    1) avoid constipation 2) follow-up with general surgeon Dr. Arnoldo Morale in a couple weeks as  advised 3) follow-up with your pulmonologist Dr. Marshell Garfinkel as previously scheduled   Discharge wound care:   Complete by: As directed    As advised by general surgeon   Increase activity slowly   Complete by: As directed         Discharge Medications     Allergies as of 02/15/2020      Reactions   Lisinopril Hives, Swelling      Medication List    TAKE these medications   acetaminophen 325 MG tablet Commonly known as: TYLENOL Take 2 tablets (650 mg total) by mouth every 6 (six) hours as needed for mild pain, fever  or headache (or Fever >/= 101).   azelastine 0.05 % ophthalmic solution Commonly known as: OPTIVAR Place 1 drop into both eyes daily as needed (allergies).   bisoprolol 5 MG tablet Commonly known as: ZEBETA Take 1 tablet (5 mg total) by mouth daily.   esomeprazole 40 MG capsule Commonly known as: NEXIUM TAKE 1 CAPSULE BY MOUTH TWO TIMES DAILY What changed: See the new instructions.   furosemide 40 MG tablet Commonly known as: LASIX Take 40 mg by mouth as needed for edema. Except Saturdays and Sundays   macitentan 10 MG tablet Commonly known as: OPSUMIT Take 1 tablet (10 mg total) by mouth daily.   Mega MultiVitamin Powd Take 1 Scoop by mouth daily. Mix 1 scoop in water   mycophenolate 500 MG tablet Commonly known as: CELLCEPT TAKE 3 TABLETS BY MOUTH IN THE MORNING AND 3 TABLETS IN THE EVENING What changed:   how much to take  how to take this  when to take this  additional instructions   NP Thyroid 90 MG tablet Generic drug: thyroid Take 1 tablet (90 mg total) by mouth daily. What changed: Another medication with the same name was removed. Continue taking this medication, and follow the directions you see here.   Ofev 150 MG Caps Generic drug: Nintedanib Take 1 capsule (150 mg total) by mouth 2 (two) times daily.   ondansetron 4 MG tablet Commonly known as: Zofran Take 1 tablet (4 mg total) by mouth every 8 (eight) hours as needed for nausea or vomiting.   oxyCODONE 5 MG immediate release tablet Commonly known as: Oxy IR/ROXICODONE Take 1 tablet (5 mg total) by mouth every 6 (six) hours as needed for moderate pain or severe pain. What changed:   when to take this  reasons to take this   polyethylene glycol 17 g packet Commonly known as: MIRALAX / GLYCOLAX Take 17 g by mouth daily. Start taking on: February 16, 2020   potassium chloride 20 MEQ/15ML (10%) Soln Take 20 mEq by mouth as needed (with Lasix for Edema). Except on saturdays and sundays    prednisoLONE acetate 1 % ophthalmic suspension Commonly known as: PRED FORTE Place 1 drop into both eyes as needed (irritation).   senna-docusate 8.6-50 MG tablet Commonly known as: Senokot-S Take 2 tablets by mouth at bedtime.   spironolactone 25 MG tablet Commonly known as: ALDACTONE TAKE 1 TABLET BY MOUTH DAILY.   sulfamethoxazole-trimethoprim 800-160 MG tablet Commonly known as: BACTRIM DS Take 1 tablet by mouth daily.   Vitamin D-3 125 MCG (5000 UT) Tabs Take 10,000 Units by mouth daily.            Discharge Care Instructions  (From admission, onward)         Start     Ordered   02/15/20 0000  Discharge wound care:  Comments: As advised by general surgeon   02/15/20 1059          Major procedures and Radiology Reports - PLEASE review detailed and final reports for all details, in brief -   CT ABDOMEN PELVIS W CONTRAST  Result Date: 02/10/2020 CLINICAL DATA:  Generalized abdominal pain. EXAM: CT ABDOMEN AND PELVIS WITH CONTRAST TECHNIQUE: Multidetector CT imaging of the abdomen and pelvis was performed using the standard protocol following bolus administration of intravenous contrast. CONTRAST:  78mL OMNIPAQUE IOHEXOL 300 MG/ML  SOLN COMPARISON:  09/05/2018 FINDINGS: Lower chest: There is atelectasis at the lung bases.The heart is enlarged. Hepatobiliary: The liver is normal. The gallbladder is distended. There appears to be some mild gallbladder wall thickening and possible pericholecystic free fluid.There is no biliary ductal dilation. Pancreas: Normal contours without ductal dilatation. No peripancreatic fluid collection. Spleen: Unremarkable. Adrenals/Urinary Tract: --Adrenal glands: Unremarkable. --Right kidney/ureter: There is a punctate nonobstructing stone in lower pole the right kidney. --Left kidney/ureter: There is a nonobstructing stone in the upper pole the left kidney. --Urinary bladder: The urinary bladder is distended. Stomach/Bowel:  --Stomach/Duodenum: No hiatal hernia or other gastric abnormality. Normal duodenal course and caliber. --Small bowel: Unremarkable. --Colon: There is a large amount of stool in the right hemicolon. --Appendix: Normal. Vascular/Lymphatic: Normal course and caliber of the major abdominal vessels. --there are mildly enlarged retroperitoneal lymph nodes. --No mesenteric lymphadenopathy. --No pelvic or inguinal lymphadenopathy. Reproductive: The uterus is enlarged and heterogeneous with multiple fibroids. The largest fibroid measures approximately 8.1 cm. These have increased in size since the prior study. There is infiltration of the pelvic fat with a small amount of pelvic free fluid. Other: No ascites or free air. The abdominal wall is normal. Musculoskeletal. No acute displaced fractures. IMPRESSION: 1. The gallbladder is distended with some mild gallbladder wall thickening and possible pericholecystic free fluid. Multiple gallstones are noted. If there is concern for acute cholecystitis, recommend further evaluation with right upper quadrant ultrasound. 2. Large amount of stool in the right hemicolon. 3. Fibroid uterus, increased in size since the prior study. There is infiltration of the pelvic fat with a small amount of pelvic free fluid. This is of unknown clinical significance. Consider pelvic inflammatory disease in the appropriate clinical setting. 4. Cardiomegaly. 5. Bilateral nonobstructive nephrolithiasis. 6. Distended urinary bladder. Electronically Signed   By: Constance Holster M.D.   On: 02/10/2020 01:57   DG CHEST PORT 1 VIEW  Result Date: 02/14/2020 CLINICAL DATA:  Acute respiratory failure due to flash pulmonary edema. EXAM: PORTABLE CHEST 1 VIEW COMPARISON:  02/13/2020; 01/31/2019; chest CT-06/12/2019 FINDINGS: Grossly unchanged cardiac silhouette and mediastinal contours. Stable position of endotracheal tube with tip overlying the tracheal air column, superior to the carina. Interval placement  of enteric tube with tip and side port projecting over the expected location of the gastric fundus. Improved aeration of the lungs with trace residual effusions and associated bibasilar opacities. No new focal airspace opacities. No pneumothorax. No acute osseous abnormalities. IMPRESSION: 1. Interval placement of enteric tube with tip and side port projecting below the left hemidiaphragm. 2. Otherwise, stable positioning of support apparatus. No pneumothorax. 3. Improved aeration of the lungs suggests resolving edema and atelectasis. 4. Trace residual bilateral effusions and associated bibasilar opacities, likely atelectasis. Electronically Signed   By: Sandi Mariscal M.D.   On: 02/14/2020 12:02   DG Chest Port 1 View  Result Date: 02/13/2020 CLINICAL DATA:  Post-op aspiration. History of interstitial lung disease. EXAM: PORTABLE CHEST 1 VIEW COMPARISON:  Radiographs 01/30/2019 and 07/13/2017. CT 06/11/2019 and 02/10/2020. FINDINGS: 1302 hours. There are new extensive bilateral airspace opacities with a basilar predominance, slightly worse on the right. There are possible bilateral pleural effusions. No pneumothorax. The heart and mediastinum are partially obscured by the airspace opacities. Mild cardiomegaly appears grossly unchanged. The stomach is distended. There are postsurgical changes in the upper abdomen. No acute osseous findings. IMPRESSION: Extensive bilateral airspace opacities consistent with aspiration or multilobar pneumonia. Possible bilateral pleural effusions. Electronically Signed   By: Richardean Sale M.D.   On: 02/13/2020 13:37   DG Chest Port 1V same Day  Result Date: 02/13/2020 CLINICAL DATA:  Status post intubation. EXAM: PORTABLE CHEST 1 VIEW COMPARISON:  February 13, 2020. FINDINGS: Stable cardiomediastinal silhouette. Endotracheal tube is in good position. Stable bilateral lung opacities are noted concerning for multifocal pneumonia. No pneumothorax is noted. Bilateral pleural  effusions are noted. Bony thorax is unremarkable. IMPRESSION: Endotracheal tube in good position. Stable bilateral lung opacities are noted concerning for multifocal pneumonia. Bilateral pleural effusions are noted. Electronically Signed   By: Marijo Conception M.D.   On: 02/13/2020 14:10   ECHOCARDIOGRAM COMPLETE  Result Date: 02/13/2020    ECHOCARDIOGRAM REPORT   Patient Name:   JESSAMY TOROSYAN Date of Exam: 02/13/2020 Medical Rec #:  [id number]       Height:       65.0 in Accession #:    [id number]      Weight:       130.1 lb Date of Birth:  May 10, 1970        BSA:          1.648 m Patient Age:    20 years        BP:           153/102 mmHg Patient Gender: F               HR:           100 bpm. Exam Location:  [facility] Procedure: 2D Echo, Cardiac Doppler and Color Doppler STAT ECHO Indications:    Cardiac arrest I46.9  History:        Patient has prior history of Echocardiogram examinations, most                 recent 11/18/2018. Risk Factors:Hypertension. ILD (interstitial                 lung disease).  Sonographer:    Alvino Chapel RCS Referring Phys: [phone] [place]  1. Left ventricular ejection fraction, by estimation, is 65 to 70%. The left ventricle has normal function. The left ventricle has no regional wall motion abnormalities. There is moderate left ventricular hypertrophy. Left ventricular diastolic parameters are indeterminate.  2. Right ventricular systolic function is low normal. The right ventricular size is normal. Tricuspid regurgitation signal is inadequate for assessing PA pressure.  3. The mitral valve is grossly normal. Trivial mitral valve regurgitation.  4. The aortic valve is grossly normal, not completely visualized. Aortic valve regurgitation is trivial.  5. No pericardial effusion. FINDINGS  Left Ventricle: Left ventricular ejection fraction, by estimation, is 65 to 70%. The left ventricle has normal function. The left ventricle has no regional wall motion abnormalities.  The left ventricular internal cavity size was normal in size. There is  moderate left ventricular hypertrophy. Left ventricular diastolic parameters are indeterminate. Right Ventricle: The right ventricular size is normal. No increase in right ventricular wall thickness. Right ventricular systolic  function is low normal. Tricuspid regurgitation signal is inadequate for assessing PA pressure. Left Atrium: Left atrial size was normal in size. Right Atrium: Right atrial size was normal in size. Pericardium: There is no evidence of pericardial effusion. Mitral Valve: The mitral valve is grossly normal. Trivial mitral valve regurgitation. Tricuspid Valve: The tricuspid valve is grossly normal. Tricuspid valve regurgitation is mild. Aortic Valve: The aortic valve is grossly normal. Aortic valve regurgitation is trivial. Pulmonic Valve: The pulmonic valve was not assessed. Pulmonic valve regurgitation is not visualized. Aorta: The aortic root is normal in size and structure. IAS/Shunts: The interatrial septum was not assessed.  LEFT VENTRICLE PLAX 2D LVIDd:         3.07 cm LVIDs:         2.17 cm LV PW:         1.42 cm LV IVS:        1.59 cm LVOT diam:     1.60 cm LV SV:         30 LV SV Index:   18 LVOT Area:     2.01 cm  RIGHT VENTRICLE RV S prime:     9.25 cm/s TAPSE (M-mode): 1.9 cm LEFT ATRIUM         Index LA diam:    2.30 cm 1.40 cm/m  AORTIC VALVE LVOT Vmax:   81.80 cm/s LVOT Vmean:  61.200 cm/s LVOT VTI:    0.148 m  AORTA Ao Root diam: 3.10 cm MITRAL VALVE               TRICUSPID VALVE MV Area (PHT): 7.16 cm    TR Peak grad:   23.6 mmHg MV Decel Time: 106 msec    TR Vmax:        243.00 cm/s MV E velocity: 83.60 cm/s                            SHUNTS                            Systemic VTI:  0.15 m                            Systemic Diam: 1.60 cm Karen Lesches MD Electronically signed by Karen Lesches MD Signature Date/Time: 02/13/2020/2:11:06 PM    Final    US Abdomen Limited RUQ  Result Date:  02/12/2020 CLINICAL DATA:  3 day history of right upper quadrant pain. EXAM: ULTRASOUND ABDOMEN LIMITED RIGHT UPPER QUADRANT COMPARISON:  Abdomen/pelvis CT 02/10/2020 FINDINGS: Gallbladder: As on recent CT scan, multiple calcified gallstones evident. Shadowing makes measurement of the stones difficult but these probably measure up to at least 2.8 cm. Gallbladder wall is thickened at 4 mm. Sonographer reports a positive sonographic Murphy sign. Common bile duct: Diameter: 4-5 mm Liver: No focal lesion identified. Within normal limits in parenchymal echogenicity. Portal vein is patent on color Doppler imaging with normal direction of blood flow towards the liver. Other: None. IMPRESSION: Cholelithiasis with gallbladder wall thickening and positive sonographic Murphy sign. Imaging features consistent with acute cholecystitis. Electronically Signed   By: Misty Stanley M.D.   On: 02/12/2020 08:08    Micro Results  Recent Results (from the past 240 hour(s))  Respiratory Panel by RT PCR (Flu A&B, Covid) -     Status: None   Collection Time: 02/09/20 10:05 PM  Specimen: Nasopharyngeal  Result Value Ref Range Status   SARS Coronavirus 2 by RT PCR NEGATIVE NEGATIVE Final    Comment: (NOTE) SARS-CoV-2 target nucleic acids are NOT DETECTED.  The SARS-CoV-2 RNA is generally detectable in upper respiratoy specimens during the acute phase of infection. The lowest concentration of SARS-CoV-2 viral copies this assay can detect is 131 copies/mL. A negative result does not preclude SARS-Cov-2 infection and should not be used as the sole basis for treatment or other patient management decisions. A negative result may occur with  improper specimen collection/handling, submission of specimen other than nasopharyngeal swab, presence of viral mutation(s) within the areas targeted by this assay, and inadequate number of viral copies (<131 copies/mL). A negative result must be combined with clinical observations,  patient history, and epidemiological information. The expected result is Negative.  Fact Sheet for Patients:  PinkCheek.be  Fact Sheet for Healthcare Providers:  GravelBags.it  This test is no t yet approved or cleared by the [place] FDA and  has been authorized for detection and/or diagnosis of SARS-CoV-2 by FDA under an Emergency Use Authorization (EUA). This EUA will remain  in effect (meaning this test can be used) for the duration of the COVID-19 declaration under Section 564(b)(1) of the Act, 21 U.S.C. section 360bbb-3(b)(1), unless the authorization is terminated or revoked sooner.     Influenza A by PCR NEGATIVE NEGATIVE Final   Influenza B by PCR NEGATIVE NEGATIVE Final    Comment: (NOTE) The Xpert Xpress SARS-CoV-2/FLU/RSV assay is intended as an aid in  the diagnosis of influenza from Nasopharyngeal swab specimens and  should not be used as a sole basis for treatment. Nasal washings and  aspirates are unacceptable for Xpert Xpress SARS-CoV-2/FLU/RSV  testing.  Fact Sheet for Patients: PinkCheek.be  Fact Sheet for Healthcare Providers: GravelBags.it  This test is not yet approved or cleared by the [place] FDA and  has been authorized for detection and/or diagnosis of SARS-CoV-2 by  FDA under an Emergency Use Authorization (EUA). This EUA will remain  in effect (meaning this test can be used) for the duration of the  Covid-19 declaration under Section 564(b)(1) of the Act, 21  U.S.C. section 360bbb-3(b)(1), unless the authorization is  terminated or revoked. Performed at [facility], [address], [place], [place] [address]   Urine Culture     Status: Abnormal   Collection Time: 02/12/20  4:49 AM   Specimen: Urine, Clean Catch  Result Value Ref Range Status   Specimen Description   Final    URINE, CLEAN CATCH Performed at [facility], [address] [place], [place] [address]    Special Requests   Final    NONE Performed at [facility], [address], [place], [place] [address]    Culture (A)  Final    <10,000 COLONIES/mL INSIGNIFICANT GROWTH Performed at [facility] [address], [place],  [address]    Report Status 02/13/2020 FINAL  Final  Respiratory Panel by RT PCR (Flu A&B, Covid) - Nasopharyngeal Swab     Status: None   Collection Time: 02/12/20 12:33 PM   Specimen: Nasopharyngeal Swab  Result Value Ref Range Status   SARS Coronavirus 2 by RT PCR NEGATIVE NEGATIVE Final    Comment: (NOTE) SARS-CoV-2 target nucleic acids are NOT DETECTED.  The SARS-CoV-2 RNA is generally detectable in upper respiratoy specimens during the acute phase of infection. The lowest concentration of SARS-CoV-2 viral copies this assay can detect is 131  copies/mL. A negative result does not preclude SARS-Cov-2 infection and should not be used as the sole basis for treatment or other patient management decisions. A negative result may occur with  improper specimen collection/handling, submission of specimen other than nasopharyngeal swab, presence of viral mutation(s) within the areas targeted by this assay, and inadequate number of viral copies (<131 copies/mL). A negative result must be combined with clinical observations, patient history, and epidemiological information. The expected result is Negative.  Fact Sheet for Patients:  PinkCheek.be  Fact Sheet for Healthcare Providers:  GravelBags.it  This test is no t yet approved or cleared by the [place] FDA and  has been authorized for detection and/or diagnosis of SARS-CoV-2 by FDA under an Emergency Use Authorization (EUA). This EUA will remain  in effect (meaning this test can be used) for the duration of the COVID-19 declaration under Section 564(b)(1) of the Act, 21 U.S.C. section  360bbb-3(b)(1), unless the authorization is terminated or revoked sooner.     Influenza A by PCR NEGATIVE NEGATIVE Final   Influenza B by PCR NEGATIVE NEGATIVE Final    Comment: (NOTE) The Xpert Xpress SARS-CoV-2/FLU/RSV assay is intended as an aid in  the diagnosis of influenza from Nasopharyngeal swab specimens and  should not be used as a sole basis for treatment. Nasal washings and  aspirates are unacceptable for Xpert Xpress SARS-CoV-2/FLU/RSV  testing.  Fact Sheet for Patients: PinkCheek.be  Fact Sheet for Healthcare Providers: GravelBags.it  This test is not yet approved or cleared by the [place] FDA and  has been authorized for detection and/or diagnosis of SARS-CoV-2 by  FDA under an Emergency Use Authorization (EUA). This EUA will remain  in effect (meaning this test can be used) for the duration of the  Covid-19 declaration under Section 564(b)(1) of the Act, 21  U.S.C. section 360bbb-3(b)(1), unless the authorization is  terminated or revoked. Performed at [facility], [address], [place], [place] [address]   Surgical pcr screen     Status: None   Collection Time: 02/12/20  6:15 PM   Specimen: Nasal Mucosa; Nasal Swab  Result Value Ref Range Status   MRSA, PCR NEGATIVE NEGATIVE Final   Staphylococcus aureus NEGATIVE NEGATIVE Final    Comment: (NOTE) The Xpert SA Assay (FDA approved for NASAL specimens in patients 55 years of age and older), is one component of a comprehensive surveillance program. It is not intended to diagnose infection nor to guide or monitor treatment. Performed at [facility], [address], [place], [place] [address]    Today   Subjective    Karen [name] today has no new complaints No fever  Or chills   No Nausea, Vomiting or Diarrhea       Patient has been seen and examined prior to discharge   Objective   Blood pressure (!) 143/97, pulse 95, temperature  98.3 F (36.8 C), temperature source Oral, resp. rate (!) 23, height 5\' 5"  (1.651 m), weight 61.1 kg, SpO2 100 %.   Intake/Output Summary (Last 24 hours) at 02/15/2020 1117 Last data filed at 02/15/2020 1000 Gross per 24 hour  Intake 870.56 ml  Output 3100 ml  Net -2229.44 ml    Exam Gen:- Awake Alert, no acute distress  HEENT:- [place].AT, No sclera icterus Neck-Supple Neck,No JVD,.  Lungs-- good air movement bilaterally no wheezing  CV- S1, S2 normal, regular Abd-  +ve B.Sounds, Abd Soft, appropriate incisional area discomfort,ND Extremity/Skin:- No  edema,   good pulses Psych-affect is  appropriate, oriented x3 Neuro-no new focal deficits, no tremors    Data Review   CBC w Diff:  Lab Results  Component Value Date   WBC 10.3 02/15/2020   HGB 10.9 (L) 02/15/2020   HCT 35.9 (L) 02/15/2020   PLT 241 02/15/2020   LYMPHOPCT 15 02/12/2020   MONOPCT 12 02/12/2020   EOSPCT 3 02/12/2020   BASOPCT 0 02/12/2020    CMP:  Lab Results  Component Value Date   NA 138 02/15/2020   NA 140 04/02/2017   K 3.3 (L) 02/15/2020   CL 97 (L) 02/15/2020   CO2 30 02/15/2020   BUN 6 02/15/2020   BUN 9 04/02/2017   CREATININE 0.61 02/15/2020   CREATININE 0.62 09/11/2019   PROT 7.8 02/15/2020   PROT 8.5 03/22/2017   ALBUMIN 3.4 (L) 02/15/2020   ALBUMIN 4.6 03/22/2017   BILITOT 0.6 02/15/2020   BILITOT 0.4 03/22/2017   ALKPHOS 53 02/15/2020   AST 42 (H) 02/15/2020   ALT 50 (H) 02/15/2020  .   Total Discharge time is about 33 minutes  Roxan Hockey M.D on 02/15/2020 at 11:17 AM  Go to [email or web address] -  for contact info  [facility] - Office  [phone]

## 2020-02-15 NOTE — Progress Notes (Signed)
Femoral and arterial line removed, WNL. Pressure and pressure dressing applied. No signs of overt bleeding after pressure applied. Pt awaiting family for D/C.

## 2020-02-15 NOTE — Progress Notes (Signed)
2 Days Post-Op  Subjective: Patient is extubated.  She has mild incisional pain.  Is not short of breath.  Objective: Vital signs in last 24 hours: Temp:  [97.8 F (36.6 C)-98.7 F (37.1 C)] 98.3 F (36.8 C) (10/10 0400) Resp:  [12-24] 23 (10/10 1100) BP: (102-162)/(54-105) 143/97 (10/10 1100) Arterial Line BP: (105-179)/(58-106) 126/70 (10/10 0800) Weight:  [61.1 kg] 61.1 kg (10/10 0400) Last BM Date: 02/15/20  Intake/Output from previous day: 10/09 0701 - 10/10 0700 In: 1393.7 [I.V.:898.3; IV Piggyback:495.4] Out: 2650 [Urine:2650] Intake/Output this shift: Total I/O In: -  Out: 450 [Urine:450]  General appearance: alert, cooperative and fatigued GI: Soft, dressings dry and intact.  Lab Results:  Recent Labs    02/14/20 0604 02/15/20 0939  WBC 13.5* 10.3  HGB 10.5* 10.9*  HCT 33.0* 35.9*  PLT 248 241   BMET Recent Labs    02/14/20 0604 02/15/20 0939  NA 137 138  K 3.2* 3.3*  CL 99 97*  CO2 27 30  GLUCOSE 136* 98  BUN 6 6  CREATININE 0.79 0.61  CALCIUM 9.2 9.6   PT/INR No results for input(s): LABPROT, INR in the last 72 hours.  Studies/Results: DG CHEST PORT 1 VIEW  Result Date: 02/14/2020 CLINICAL DATA:  Acute respiratory failure due to flash pulmonary edema. EXAM: PORTABLE CHEST 1 VIEW COMPARISON:  02/13/2020; 01/31/2019; chest CT-06/12/2019 FINDINGS: Grossly unchanged cardiac silhouette and mediastinal contours. Stable position of endotracheal tube with tip overlying the tracheal air column, superior to the carina. Interval placement of enteric tube with tip and side port projecting over the expected location of the gastric fundus. Improved aeration of the lungs with trace residual effusions and associated bibasilar opacities. No new focal airspace opacities. No pneumothorax. No acute osseous abnormalities. IMPRESSION: 1. Interval placement of enteric tube with tip and side port projecting below the left hemidiaphragm. 2. Otherwise, stable positioning of  support apparatus. No pneumothorax. 3. Improved aeration of the lungs suggests resolving edema and atelectasis. 4. Trace residual bilateral effusions and associated bibasilar opacities, likely atelectasis. Electronically Signed   By: Sandi Mariscal M.D.   On: 02/14/2020 12:02   DG Chest Port 1 View  Result Date: 02/13/2020 CLINICAL DATA:  Post-op aspiration. History of interstitial lung disease. EXAM: PORTABLE CHEST 1 VIEW COMPARISON:  Radiographs 01/30/2019 and 07/13/2017. CT 06/11/2019 and 02/10/2020. FINDINGS: 1302 hours. There are new extensive bilateral airspace opacities with a basilar predominance, slightly worse on the right. There are possible bilateral pleural effusions. No pneumothorax. The heart and mediastinum are partially obscured by the airspace opacities. Mild cardiomegaly appears grossly unchanged. The stomach is distended. There are postsurgical changes in the upper abdomen. No acute osseous findings. IMPRESSION: Extensive bilateral airspace opacities consistent with aspiration or multilobar pneumonia. Possible bilateral pleural effusions. Electronically Signed   By: Richardean Sale M.D.   On: 02/13/2020 13:37   DG Chest Port 1V same Day  Result Date: 02/13/2020 CLINICAL DATA:  Status post intubation. EXAM: PORTABLE CHEST 1 VIEW COMPARISON:  February 13, 2020. FINDINGS: Stable cardiomediastinal silhouette. Endotracheal tube is in good position. Stable bilateral lung opacities are noted concerning for multifocal pneumonia. No pneumothorax is noted. Bilateral pleural effusions are noted. Bony thorax is unremarkable. IMPRESSION: Endotracheal tube in good position. Stable bilateral lung opacities are noted concerning for multifocal pneumonia. Bilateral pleural effusions are noted. Electronically Signed   By: Marijo Conception M.D.   On: 02/13/2020 14:10   ECHOCARDIOGRAM COMPLETE  Result Date: 02/13/2020    ECHOCARDIOGRAM REPORT  Patient Name:   Karen [name] Date of Exam: 02/13/2020 Medical Rec  #:  [id number]       Height:       65.0 in Accession #:    [id number]      Weight:       130.1 lb Date of Birth:  [birth year]        BSA:          1.648 m Patient Age:    49 years        BP:           153/102 mmHg Patient Gender: F               HR:           100 bpm. Exam Location:  [facility] Procedure: 2D Echo, Cardiac Doppler and Color Doppler STAT ECHO Indications:    Cardiac arrest I46.9  History:        Patient has prior history of Echocardiogram examinations, most                 recent 11/18/2018. Risk Factors:Hypertension. ILD (interstitial                 lung disease).  Sonographer:    Alvino Chapel RCS Referring Phys: [phone] [facility]  1. Left ventricular ejection fraction, by estimation, is 65 to 70%. The left ventricle has normal function. The left ventricle has no regional wall motion abnormalities. There is moderate left ventricular hypertrophy. Left ventricular diastolic parameters are indeterminate.  2. Right ventricular systolic function is low normal. The right ventricular size is normal. Tricuspid regurgitation signal is inadequate for assessing PA pressure.  3. The mitral valve is grossly normal. Trivial mitral valve regurgitation.  4. The aortic valve is grossly normal, not completely visualized. Aortic valve regurgitation is trivial.  5. No pericardial effusion. FINDINGS  Left Ventricle: Left ventricular ejection fraction, by estimation, is 65 to 70%. The left ventricle has normal function. The left ventricle has no regional wall motion abnormalities. The left ventricular internal cavity size was normal in size. There is  moderate left ventricular hypertrophy. Left ventricular diastolic parameters are indeterminate. Right Ventricle: The right ventricular size is normal. No increase in right ventricular wall thickness. Right ventricular systolic function is low normal. Tricuspid regurgitation signal is inadequate for assessing PA pressure. Left Atrium: Left atrial size was normal in  size. Right Atrium: Right atrial size was normal in size. Pericardium: There is no evidence of pericardial effusion. Mitral Valve: The mitral valve is grossly normal. Trivial mitral valve regurgitation. Tricuspid Valve: The tricuspid valve is grossly normal. Tricuspid valve regurgitation is mild. Aortic Valve: The aortic valve is grossly normal. Aortic valve regurgitation is trivial. Pulmonic Valve: The pulmonic valve was not assessed. Pulmonic valve regurgitation is not visualized. Aorta: The aortic root is normal in size and structure. IAS/Shunts: The interatrial septum was not assessed.  LEFT VENTRICLE PLAX 2D LVIDd:         3.07 cm LVIDs:         2.17 cm LV PW:         1.42 cm LV IVS:        1.59 cm LVOT diam:     1.60 cm LV SV:         30 LV SV Index:   18 LVOT Area:     2.01 cm  RIGHT VENTRICLE RV S prime:     9.25 cm/s TAPSE (M-mode): 1.9 cm LEFT  ATRIUM         Index LA diam:    2.30 cm 1.40 cm/m  AORTIC VALVE LVOT Vmax:   81.80 cm/s LVOT Vmean:  61.200 cm/s LVOT VTI:    0.148 m  AORTA Ao Root diam: 3.10 cm MITRAL VALVE               TRICUSPID VALVE MV Area (PHT): 7.16 cm    TR Peak grad:   23.6 mmHg MV Decel Time: 106 msec    TR Vmax:        243.00 cm/s MV E velocity: 83.60 cm/s                            SHUNTS                            Systemic VTI:  0.15 m                            Systemic Diam: 1.60 cm Rozann Lesches MD Electronically signed by Rozann Lesches MD Signature Date/Time: 02/13/2020/2:11:06 PM    Final     Anti-infectives: Anti-infectives (From admission, onward)   Start     Dose/Rate Route Frequency Ordered Stop   02/12/20 1630  sulfamethoxazole-trimethoprim (BACTRIM DS) 800-160 MG per tablet 1 tablet  Status:  Discontinued        1 tablet Oral Daily 02/12/20 1627 02/13/20 1542   02/12/20 0930  cefTRIAXone (ROCEPHIN) 2 g in sodium chloride 0.9 % 100 mL IVPB        2 g 200 mL/hr over 30 Minutes Intravenous Every 24 hours 02/12/20 0923     02/12/20 0900   piperacillin-tazobactam (ZOSYN) IVPB 3.375 g        3.375 g 100 mL/hr over 30 Minutes Intravenous  Once 02/12/20 0859 02/12/20 0942      Assessment/Plan: s/p Procedure(s): LAPAROSCOPIC CHOLECYSTECTOMY Impression: Respiratory failure has resolved.  Okay for discharge from surgery standpoint.  I will follow up with the patient in 2 weeks.  Discussed with Dr. Denton Brick.  LOS: 3 days    [facility]iva Signs 02/15/2020

## 2020-02-15 NOTE — Discharge Instructions (Signed)
Laparoscopic Cholecystectomy, Care After This sheet gives you information about how to care for yourself after your procedure. Your doctor may also give you more specific instructions. If you have problems or questions, contact your doctor. Follow these instructions at home: Care for cuts from surgery (incisions)   Follow instructions from your doctor about how to take care of your cuts from surgery. Make sure you: ? Wash your hands with soap and water before you change your bandage (dressing). If you cannot use soap and water, use hand sanitizer. ? Change your bandage as told by your doctor. ? Leave stitches (sutures), skin glue, or skin tape (adhesive) strips in place. They may need to stay in place for 2 weeks or longer. If tape strips get loose and curl up, you may trim the loose edges. Do not remove tape strips completely unless your doctor says it is okay.  Do not take baths, swim, or use a hot tub until your doctor says it is okay. Ask your doctor if you can take showers. You may only be allowed to take sponge baths for bathing.  Check your surgical cut area every day for signs of infection. Check for: ? More redness, swelling, or pain. ? More fluid or blood. ? Warmth. ? Pus or a bad smell. Activity  Do not drive or use heavy machinery while taking prescription pain medicine.  Do not lift anything that is heavier than 10 lb (4.5 kg) until your doctor says it is okay.  Do not play contact sports until your doctor says it is okay.  Do not drive for 24 hours if you were given a medicine to help you relax (sedative).  Rest as needed. Do not return to work or school until your doctor says it is okay. General instructions  Take over-the-counter and prescription medicines only as told by your doctor.  To prevent or treat constipation while you are taking prescription pain medicine, your doctor may recommend that you: ? Drink enough fluid to keep your pee (urine) clear or pale  yellow. ? Take over-the-counter or prescription medicines. ? Eat foods that are high in fiber, such as fresh fruits and vegetables, whole grains, and beans. ? Limit foods that are high in fat and processed sugars, such as fried and sweet foods. Contact a doctor if:  You develop a rash.  You have more redness, swelling, or pain around your surgical cuts.  You have more fluid or blood coming from your surgical cuts.  Your surgical cuts feel warm to the touch.  You have pus or a bad smell coming from your surgical cuts.  You have a fever.  One or more of your surgical cuts breaks open. Get help right away if:  You have trouble breathing.  You have chest pain.  You have pain that is getting worse in your shoulders.  You faint or feel dizzy when you stand.  You have very bad pain in your belly (abdomen).  You are sick to your stomach (nauseous) for more than one day.  You have throwing up (vomiting) that lasts for more than one day.  You have leg pain. This information is not intended to replace advice given to you by your health care provider. Make sure you discuss any questions you have with your health care provider. Document Revised: 04/06/2017 Document Reviewed: 10/11/2015 Elsevier Patient Education  [place]. 1) avoid constipation 2) follow-up with general surgeon Dr. Arnoldo Morale in a couple weeks as advised 3) follow-up with  your pulmonologist Dr. Marshell Garfinkel as previously scheduled

## 2020-02-16 ENCOUNTER — Encounter (HOSPITAL_COMMUNITY): Payer: Self-pay | Admitting: General Surgery

## 2020-02-16 ENCOUNTER — Other Ambulatory Visit: Payer: Self-pay | Admitting: *Deleted

## 2020-02-16 LAB — SURGICAL PATHOLOGY

## 2020-02-16 NOTE — Patient Outreach (Addendum)
[facility]) Care Management  02/16/2020  Iyonnah Ferrante 1971/01/01 [id number]   Transition of care call/case closure   Referral received:02/13/20 Initial outreach:02/16/20 Insurance: [employer]    Subjective: Initial successful telephone call to patient's preferred number in order to complete transition of care assessment; 2 HIPAA identifiers verified. Explained purpose of call and completed transition of care assessment.  Kennadie states that she is doing okay, denies post-operative problems, says surgical incisions are unremarkable, states surgical pain well managed with prescribed medications,using tylenol to manage pain. She reports not having a good appetite , taking it slow, eating small amounts at at time, she denies having nausea  She denies bowel or bladder problems has had small loose bm x 1, urinating without problems. Patient mother is  assisting with her  recovery.  She discussed  ongoing health issues of hypertension, interstitial lung disease unsure if enrolled in chronic condition program.   She is unsure if she has the hospital indemnity plan, provided contact number to [employer] to file a claim if needed. She has made contact with [employer] regarding leave.  She uses a [facility] outpatient pharmacy at [facility]. Objective:  Gennavieve Huq  was hospitalized at [facility] from 10/7-10/10/21 for Nausea Abdominal pain, Acute Cholecystitis, Laparoscopic cholecystectomy,experienced Acute respiratory failure requiring intubation.   Comorbidities include: Hypertension, Interstitial lung disease, Scleroderma.  She was discharged to home on 02/15/20 without the need for home health services or DME.   Assessment:  Patient voices good understanding of all discharge instructions.  See transition of care flowsheet for assessment details.   Plan:  Reviewed hospital discharge diagnosis of Laparoscopic Cholecystectomy   and discharge treatment  plan using hospital discharge instructions, assessing medication adherence, reviewing problems requiring provider notification, and discussing the importance of follow up with surgeon, primary care provider and/or specialists as directed.  Reviewed [place] healthy lifestyle program information to receive discounted premium for  2022   Step 1: Get  your annual physical  Step 2: Complete your health assessment  Step 3:Identify your current health status and complete the corresponding action step between January 1, and January 07, 2020.  She has completed.   Using [facility] website, verified that patient is an active participate in [facility] [employer] chronic disease management program. Will send contact and website information with outreach letter.  No ongoing care management needs identified so will close case to [facility] services and route successful outreach letter with [name] pamphlet and 24 Hour Nurse Line Magnet to [facility] clinical pool to be mailed to patient's home address.  Thanked patient for their services to [facility].  Joylene Draft, RN, BSN  [facility] Management Coordinator  [phone]- Mobile [phone]- Toll Free Main Office

## 2020-02-17 MED FILL — Medication: Qty: 1 | Status: AC

## 2020-02-20 ENCOUNTER — Telehealth (INDEPENDENT_AMBULATORY_CARE_PROVIDER_SITE_OTHER): Payer: 59 | Admitting: General Surgery

## 2020-02-20 ENCOUNTER — Encounter: Payer: Self-pay | Admitting: General Surgery

## 2020-02-20 DIAGNOSIS — Z09 Encounter for follow-up examination after completed treatment for conditions other than malignant neoplasm: Secondary | ICD-10-CM

## 2020-02-20 NOTE — Progress Notes (Addendum)
Subjective:     Karen [name]  Telephone postoperative visit performed.  Patient states she is doing well.  Her breathing is fine.  She has mild abdominal pain which is well controlled.  She denies any nausea or vomiting. Objective:    There were no vitals taken for this visit.  General:   No distress       Assessment:    Doing well postoperatively.    Plan:   Patient returning to my office on 02/26/2020 for staple removal.  Total phone time 4 minutes.  Addendum: I was in the hospital during this telephone visit.  Patient gave consent for telephone postoperative visit.  Patient understood that should they have any issues that needed a more personal visit, she could contact my office for an in person postoperative visit.  Patient was at home for the telephone visit.

## 2020-02-26 ENCOUNTER — Other Ambulatory Visit: Payer: Self-pay

## 2020-02-26 ENCOUNTER — Encounter: Payer: Self-pay | Admitting: General Surgery

## 2020-02-26 ENCOUNTER — Other Ambulatory Visit: Payer: Self-pay | Admitting: Family Medicine

## 2020-02-26 ENCOUNTER — Ambulatory Visit (INDEPENDENT_AMBULATORY_CARE_PROVIDER_SITE_OTHER): Payer: 59 | Admitting: General Surgery

## 2020-02-26 VITALS — BP 120/88 | HR 73 | Temp 98.2°F | Resp 12 | Ht 65.0 in | Wt 128.0 lb

## 2020-02-26 DIAGNOSIS — K810 Acute cholecystitis: Secondary | ICD-10-CM

## 2020-02-26 DIAGNOSIS — Z09 Encounter for follow-up examination after completed treatment for conditions other than malignant neoplasm: Secondary | ICD-10-CM

## 2020-02-26 NOTE — Progress Notes (Signed)
Subjective:     Karen [name]  here for postoperative visit, status post laparoscopic cholecystectomy with postoperative flash pulmonary edema which has resolved.  Patient states she is back to her baseline pulmonary status.  She is having some loose stools, but has no right upper quadrant abdominal pain, nausea, or vomiting.  Her strength is improving.  She does have a baseline scleroderma condition along with pulmonary interstitial disease. Objective:    BP 120/88   Pulse 73   Temp 98.2 F (36.8 C) (Oral)   Resp 12   Ht 5\' 5"  (1.651 m)   Wt 128 lb (58.1 kg)   SpO2 95%   BMI 21.30 kg/m   General:  alert, cooperative and no distress  abdomen soft, incisions healing well.  Staples removed, Steri-Strips applied. Final pathology consistent with diagnosis.     Assessment:    Doing well postoperatively. Pulmonary status back to baseline    Plan:   She should continue increasing her activity as able.  May return to work without restrictions on 03/15/2020.  Follow-up here as needed.

## 2020-03-11 ENCOUNTER — Other Ambulatory Visit: Payer: Self-pay

## 2020-03-11 ENCOUNTER — Ambulatory Visit (INDEPENDENT_AMBULATORY_CARE_PROVIDER_SITE_OTHER): Payer: 59 | Admitting: Internal Medicine

## 2020-03-11 ENCOUNTER — Encounter (INDEPENDENT_AMBULATORY_CARE_PROVIDER_SITE_OTHER): Payer: Self-pay | Admitting: Internal Medicine

## 2020-03-11 VITALS — BP 150/98 | Temp 97.5°F | Ht 65.0 in | Wt 129.6 lb

## 2020-03-11 DIAGNOSIS — R1011 Right upper quadrant pain: Secondary | ICD-10-CM | POA: Diagnosis not present

## 2020-03-11 DIAGNOSIS — I10 Essential (primary) hypertension: Secondary | ICD-10-CM | POA: Diagnosis not present

## 2020-03-11 DIAGNOSIS — E039 Hypothyroidism, unspecified: Secondary | ICD-10-CM

## 2020-03-11 NOTE — Progress Notes (Signed)
Metrics: Intervention Frequency ACO  Documented Smoking Status Yearly  Screened one or more times in 24 months  Cessation Counseling or  Active cessation medication Past 24 months  Past 24 months   Guideline developer: UpToDate (See UpToDate for funding source) Date Released: 2014       Wellness Office Visit  Subjective:  Patient ID: Karen [name], female    DOB: 02-Mar-1971  Age: 49 y.o. MRN: [id number]  CC: This lady comes in for follow-up of hypertension and hypothyroidism. HPI  She is now approximately 1 month post laparoscopic cholecystectomy and still having abdominal discomfort and bloating/swelling.  This is the symptom that is bothering her the most. Unfortunately, she did not take her NP thyroid this morning.  She still has cold extremities. Past Medical History:  Diagnosis Date  . GERD (gastroesophageal reflux disease)   . Hypertension   . Hypothyroidism   . ILD (interstitial lung disease) ([place]) 02/08/2018  . Interstitial lung disease ([place])   . Multinodular thyroid    benign FNA 08/2017.  Marland Kitchen Perimenopause 02/27/2019  . Scleroderma ([place])   . Vitamin D deficiency disease 02/27/2019   Past Surgical History:  Procedure Laterality Date  . ABDOMINAL HERNIA REPAIR    . BUNIONECTOMY Bilateral 10/2018  . CHOLECYSTECTOMY N/A 02/13/2020   Procedure: LAPAROSCOPIC CHOLECYSTECTOMY;  Surgeon: Aviva Signs, MD;  Location: [facility];  Service: General;  Laterality: N/A;  . COLONOSCOPY WITH PROPOFOL N/A 01/02/2019   Procedure: COLONOSCOPY WITH PROPOFOL;  Surgeon: Daneil Dolin, MD;  Location: [facility];  Service: Endoscopy;  Laterality: N/A;  12:30pm  . ESOPHAGOGASTRODUODENOSCOPY (EGD) WITH PROPOFOL N/A 01/28/2018   erosive reflux esophagitis, patulous EG Junction, no dilation, incomplete EGD due to retained food in stomach. GES thereafter with delayed gastric emptying.   Marland Kitchen RIGHT HEART CATH N/A 09/27/2017   Procedure: RIGHT HEART CATH;  Surgeon: Jolaine Artist, MD;  Location: [facility];  Service: Cardiovascular;  Laterality: N/A;  . RIGHT HEART CATH N/A 09/05/2019   Procedure: RIGHT HEART CATH;  Surgeon: Jolaine Artist, MD;  Location: [facility];  Service: Cardiovascular;  Laterality: N/A;  . UTERINE FIBROID SURGERY       Family History  Problem Relation Age of Onset  . Hypertension Father   . Hypertension Sister   . Hypertension Brother   . Diabetes Maternal Grandfather   . Diabetes Maternal Uncle   . Diabetes Mother   . Colon cancer Neg Hx     Social History   Social History Narrative   Patient is right-handed. She lives alone in one level home, a few steps to enter.CMA [facility].   Social History   Tobacco Use  . Smoking status: Never Smoker  . Smokeless tobacco: Never Used  Substance Use Topics  . Alcohol use: No    Current Meds  Medication Sig  . acetaminophen (TYLENOL) 325 MG tablet Take 2 tablets (650 mg total) by mouth every 6 (six) hours as needed for mild pain, fever or headache (or Fever >/= 101).  Marland Kitchen azelastine (OPTIVAR) 0.05 % ophthalmic solution Place 1 drop into both eyes daily as needed (allergies).   . bisoprolol (ZEBETA) 5 MG tablet Take 1 tablet (5 mg total) by mouth daily.  . Cholecalciferol (VITAMIN D-3) 5000 units TABS Take 10,000 Units by mouth daily.   Marland Kitchen esomeprazole (NEXIUM) 40 MG capsule TAKE 1 CAPSULE BY MOUTH TWO TIMES DAILY (Patient taking differently: Take 40 mg by mouth in the morning and at  bedtime. )  . furosemide (LASIX) 40 MG tablet Take 40 mg by mouth as needed for edema. Except Saturdays and Sundays   . macitentan (OPSUMIT) 10 MG tablet Take 1 tablet (10 mg total) by mouth daily.  . Multiple Vitamins-Minerals (MEGA MULTIVITAMIN) POWD Take 1 Scoop by mouth daily. Mix 1 scoop in water  . mycophenolate (CELLCEPT) 500 MG tablet TAKE 3 TABLETS BY MOUTH IN THE MORNING AND 3 TABLETS IN THE EVENING (Patient taking differently: Take 1,500 mg by mouth 2 (two) times daily. )  . Nintedanib  (OFEV) 150 MG CAPS Take 1 capsule (150 mg total) by mouth 2 (two) times daily.  . NP THYROID 90 MG tablet Take 1 tablet (90 mg total) by mouth daily.  . ondansetron (ZOFRAN) 4 MG tablet Take 1 tablet (4 mg total) by mouth every 8 (eight) hours as needed for nausea or vomiting.  . polyethylene glycol (MIRALAX / GLYCOLAX) 17 g packet Take 17 g by mouth daily.  . potassium chloride 20 MEQ/15ML (10%) SOLN Take 20 mEq by mouth as needed (with Lasix for Edema). Except on saturdays and sundays   . senna-docusate (SENOKOT-S) 8.6-50 MG tablet Take 2 tablets by mouth at bedtime.  Marland Kitchen spironolactone (ALDACTONE) 25 MG tablet TAKE 1 TABLET BY MOUTH DAILY. (Patient taking differently: Take 25 mg by mouth daily. )      Depression screen [facility] 2/9 06/19/2019 08/30/2017  Decreased Interest 0 0  Down, Depressed, Hopeless 0 0  PHQ - 2 Score 0 0  Altered sleeping 1 -  Tired, decreased energy 2 -  Change in appetite 0 -  Feeling bad or failure about yourself  0 -  Trouble concentrating 0 -  Moving slowly or fidgety/restless 0 -  Suicidal thoughts 0 -  PHQ-9 Score 3 -  Difficult doing work/chores Somewhat difficult -  Some recent data might be hidden     Objective:   Today's Vitals: BP (!) 150/98   Temp (!) 97.5 F (36.4 C) (Temporal)   Ht 5\' 5"  (1.651 m)   Wt 129 lb 9.6 oz (58.8 kg)   BMI 21.57 kg/m  Vitals with BMI 03/11/2020 02/26/2020 02/15/2020  Height 5\' 5"  5\' 5"  -  Weight 129 lbs 10 oz 128 lbs -  BMI 73.41 93.7 -  Systolic 902 409 735  Diastolic 98 88 97  Pulse - 73 -     Physical Exam   She does not look like she is feeling well.  Blood pressure elevated today likely due to the discomfort/abdominal pain.  Lung fields are clear.  Abdomen is soft and bloated and bowel sounds are present and appear to be normal.  There is no rebound tenderness.    Assessment   1. Right upper quadrant abdominal pain   2. Hypothyroidism, adult   3. Essential hypertension       Tests  ordered Orders Placed This Encounter  Procedures  . CBC  . COMPLETE METABOLIC PANEL WITH GFR     Plan: 1. She needs to be reviewed again by Dr. Arnoldo Morale, surgery and we will try and get an appointment as soon as possible. 2. I will check blood count and electrolytes to make sure these are okay. 3. Continue with same dose of NP thyroid for now. 4. Follow-up in a couple of months to see how she is doing and we will check levels then.   No orders of the defined types were placed in this encounter.   [name], MD

## 2020-03-15 DIAGNOSIS — E559 Vitamin D deficiency, unspecified: Secondary | ICD-10-CM | POA: Diagnosis not present

## 2020-03-15 DIAGNOSIS — R1011 Right upper quadrant pain: Secondary | ICD-10-CM | POA: Diagnosis not present

## 2020-03-16 ENCOUNTER — Other Ambulatory Visit: Payer: Self-pay | Admitting: Family Medicine

## 2020-03-16 ENCOUNTER — Ambulatory Visit (INDEPENDENT_AMBULATORY_CARE_PROVIDER_SITE_OTHER): Payer: 59 | Admitting: General Surgery

## 2020-03-16 ENCOUNTER — Encounter: Payer: Self-pay | Admitting: General Surgery

## 2020-03-16 ENCOUNTER — Other Ambulatory Visit: Payer: Self-pay

## 2020-03-16 VITALS — BP 172/110 | HR 85 | Temp 98.5°F | Resp 14 | Ht 65.0 in | Wt 133.0 lb

## 2020-03-16 DIAGNOSIS — Z09 Encounter for follow-up examination after completed treatment for conditions other than malignant neoplasm: Secondary | ICD-10-CM

## 2020-03-16 LAB — COMPLETE METABOLIC PANEL WITH GFR
AG Ratio: 1.3 (calc) (ref 1.0–2.5)
ALT: 7 U/L (ref 6–29)
AST: 16 U/L (ref 10–35)
Albumin: 4.3 g/dL (ref 3.6–5.1)
Alkaline phosphatase (APISO): 79 U/L (ref 37–153)
BUN: 11 mg/dL (ref 7–25)
CO2: 23 mmol/L (ref 20–32)
Calcium: 10.6 mg/dL — ABNORMAL HIGH (ref 8.6–10.4)
Chloride: 107 mmol/L (ref 98–110)
Creat: 0.65 mg/dL (ref 0.50–1.05)
GFR, Est African American: 119 mL/min/{1.73_m2} (ref >=60)
GFR, Est Non African American: 103 mL/min/{1.73_m2} (ref >=60)
Globulin: 3.4 g/dL (calc) (ref 1.9–3.7)
Glucose, Bld: 90 mg/dL (ref 65–99)
Potassium: 4.2 mmol/L (ref 3.5–5.3)
Sodium: 142 mmol/L (ref 135–146)
Total Bilirubin: 0.5 mg/dL (ref 0.2–1.2)
Total Protein: 7.7 g/dL (ref 6.1–8.1)

## 2020-03-16 LAB — CBC
HCT: 39.4 % (ref 35.0–45.0)
Hemoglobin: 12.4 g/dL (ref 11.7–15.5)
MCH: 27.2 pg (ref 27.0–33.0)
MCHC: 31.5 g/dL — ABNORMAL LOW (ref 32.0–36.0)
MCV: 86.4 fL (ref 80.0–100.0)
MPV: 10.9 fL (ref 7.5–12.5)
Platelets: 153 10*3/uL (ref 140–400)
RBC: 4.56 10*6/uL (ref 3.80–5.10)
RDW: 13.2 % (ref 11.0–15.0)
WBC: 5.2 10*3/uL (ref 3.8–10.8)

## 2020-03-16 LAB — VITAMIN D 25 HYDROXY (VIT D DEFICIENCY, FRACTURES): Vit D, 25-Hydroxy: 46 ng/mL (ref 30–100)

## 2020-03-16 NOTE — Patient Instructions (Signed)
Citroma - Citrate of magnesia.

## 2020-03-17 NOTE — Progress Notes (Signed)
Subjective:     Karen [name]  Patient returns to my care status post laparoscopic cholecystectomy for nonspecific abdominal pain and constipation.  She states that initially after the surgery, she had some urgency to go to the bathroom, but now she has not had a bowel movement in 6 days.  She complains of some indigestion.  She was seen by her primary care physician and her liver enzyme tests were all noted to be within normal limits.  She denies any fever, chills, jaundice.  She has seen Dr. [name] in the past for epigastric pain and reflux disease.  She is on a PPI. Objective:    BP (!) 172/110   Pulse 85   Temp 98.5 F (36.9 C) (Oral)   Resp 14   Ht 5\' 5"  (1.651 m)   Wt 133 lb (60.3 kg)   SpO2 98%   BMI 22.13 kg/m   General:  alert, cooperative and no distress  Abdomen is soft without any specific point tenderness.  No rigidity is noted.  Incisions have healed well.     Assessment:     rourkPatient does not appear to be having any complication from her laparoscopic cholecystectomy.  She may have an element of IBS with constipation.  I did tell her to follow-up with Dr. [name] of GI.  She was instructed to take Citroma to help relieve her constipation.  She may return to work without restrictions on 03/22/2020.    Plan:   Follow-up here as needed.

## 2020-03-18 DIAGNOSIS — Z008 Encounter for other general examination: Secondary | ICD-10-CM | POA: Diagnosis not present

## 2020-03-18 DIAGNOSIS — K30 Functional dyspepsia: Secondary | ICD-10-CM | POA: Diagnosis not present

## 2020-03-18 DIAGNOSIS — K2100 Gastro-esophageal reflux disease with esophagitis, without bleeding: Secondary | ICD-10-CM | POA: Diagnosis not present

## 2020-03-18 DIAGNOSIS — F54 Psychological and behavioral factors associated with disorders or diseases classified elsewhere: Secondary | ICD-10-CM | POA: Diagnosis not present

## 2020-03-18 DIAGNOSIS — F0631 Mood disorder due to known physiological condition with depressive features: Secondary | ICD-10-CM | POA: Diagnosis not present

## 2020-03-18 DIAGNOSIS — Z7682 Awaiting organ transplant status: Secondary | ICD-10-CM | POA: Diagnosis not present

## 2020-03-25 ENCOUNTER — Other Ambulatory Visit (HOSPITAL_COMMUNITY): Payer: Self-pay | Admitting: Dermatology

## 2020-03-25 DIAGNOSIS — D239 Other benign neoplasm of skin, unspecified: Secondary | ICD-10-CM

## 2020-03-25 DIAGNOSIS — L658 Other specified nonscarring hair loss: Secondary | ICD-10-CM | POA: Insufficient documentation

## 2020-03-25 DIAGNOSIS — L2084 Intrinsic (allergic) eczema: Secondary | ICD-10-CM | POA: Diagnosis not present

## 2020-03-25 DIAGNOSIS — L700 Acne vulgaris: Secondary | ICD-10-CM | POA: Diagnosis not present

## 2020-03-25 DIAGNOSIS — L089 Local infection of the skin and subcutaneous tissue, unspecified: Secondary | ICD-10-CM | POA: Diagnosis not present

## 2020-03-25 DIAGNOSIS — L669 Cicatricial alopecia, unspecified: Secondary | ICD-10-CM | POA: Diagnosis not present

## 2020-03-25 HISTORY — DX: Other benign neoplasm of skin, unspecified: D23.9

## 2020-03-25 MED FILL — TACROLIMUS 0.1% OINTMENT: 0.1 | 30 days supply | Qty: 60 | Fill #0

## 2020-03-25 MED FILL — TRIAMCINOLONE 0.1% OINTMENT: 0.1 | 21 days supply | Qty: 454 | Fill #0

## 2020-03-25 MED FILL — CLOBETASOL PROPIONATE 0.05: 0.05 | 20 days supply | Qty: 45 | Fill #0

## 2020-03-25 MED FILL — CLINDAMYCIN PHOS-BENZOYL PE: 1-5 | 30 days supply | Qty: 50 | Fill #0

## 2020-03-26 ENCOUNTER — Telehealth: Payer: Self-pay | Admitting: Family Medicine

## 2020-03-26 NOTE — Telephone Encounter (Signed)
Received [employer] disability paperwork - filled out and faxed back along with the notes requested.   Received confirmation and copy sent to scan.

## 2020-03-29 ENCOUNTER — Other Ambulatory Visit (HOSPITAL_COMMUNITY): Payer: Self-pay | Admitting: Internal Medicine

## 2020-03-29 MED FILL — POTASSIUM CHLORIDE 20 MEQ/1: 20 MEQ/15ML | 30 days supply | Qty: 473 | Fill #0

## 2020-03-29 MED FILL — BISOPROLOL FUMARATE 5 MG TA: 5 | 30 days supply | Qty: 30 | Fill #1

## 2020-04-06 MED FILL — ESOMEPRAZOLE MAG DR 40 MG C: 40 | 30 days supply | Qty: 60 | Fill #1

## 2020-04-06 MED FILL — SPIRONOLACTONE 25 MG TABS: 25 | 30 days supply | Qty: 30 | Fill #2

## 2020-04-07 ENCOUNTER — Telehealth: Payer: Self-pay | Admitting: Pulmonary Disease

## 2020-04-14 NOTE — Telephone Encounter (Signed)
[email or web address] to schedule this appt pt is aware [name]

## 2020-04-16 NOTE — Telephone Encounter (Signed)
Left another message for pt to call - mailr appt info to pa today 04/16/20 [name]

## 2020-04-19 MED FILL — NP THYROID 90 MG TABLET: 90 | 30 days supply | Qty: 30 | Fill #2

## 2020-04-27 ENCOUNTER — Other Ambulatory Visit (HOSPITAL_COMMUNITY): Payer: Self-pay

## 2020-04-27 MED ORDER — MACITENTAN 10 MG PO TABS: 10.0000 mg | ORAL_TABLET | Freq: Every day | ORAL | 11 refills | Status: DC

## 2020-05-13 ENCOUNTER — Ambulatory Visit (INDEPENDENT_AMBULATORY_CARE_PROVIDER_SITE_OTHER): Payer: 59 | Admitting: Internal Medicine

## 2020-05-13 ENCOUNTER — Telehealth: Payer: Self-pay | Admitting: Pulmonary Disease

## 2020-05-13 NOTE — Telephone Encounter (Signed)
Discussed with patient and referral made to outpatient therapies such MAB or oral therapies.

## 2020-05-13 NOTE — Telephone Encounter (Signed)
Called and spoke with Patient.  Patient stated she was tested 05/11/20 for covid and her test results come in this morning positive. Patient stated she started having a scratchy throat, feeling bad Sunday, body aches on Monday.  Patient stated she has been keeping a check on her temperature. Temp today has been normal, but yesterday temp was 99.2.  Patient stated she has not taken any Tylenol, just her current meds. Patient stated she only has a runny nose today. Patient stated she was flu and covid vaccinated. Patient is very upset, because of her lung condition, and fear of ventilator.  Patient was tested and taken out of work by [employer] at work. Patient stated she has been staying hydrated and staying to herself. Patient stated she has been very careful and doesn't know how or who she caught Covid from. Patient is wanting any recommendations from Dr. Isaiah Serge for treating Covid, with her lung condition.  Message routed to Dr. Isaiah Serge

## 2020-05-15 ENCOUNTER — Ambulatory Visit (HOSPITAL_COMMUNITY)
Admission: RE | Admit: 2020-05-15 | Discharge: 2020-05-15 | Disposition: A | Discharge status: Home / Self Care | Payer: 59 | Source: Ambulatory Visit | Attending: Pulmonary Disease | Admitting: Pulmonary Disease

## 2020-05-15 ENCOUNTER — Other Ambulatory Visit: Payer: Self-pay | Admitting: Unknown Physician Specialty

## 2020-05-15 ENCOUNTER — Telehealth: Payer: Self-pay | Admitting: Unknown Physician Specialty

## 2020-05-15 ENCOUNTER — Encounter: Payer: Self-pay | Admitting: Unknown Physician Specialty

## 2020-05-15 DIAGNOSIS — M349 Systemic sclerosis, unspecified: Secondary | ICD-10-CM | POA: Diagnosis not present

## 2020-05-15 DIAGNOSIS — U071 COVID-19: Secondary | ICD-10-CM

## 2020-05-15 MED ORDER — METHYLPREDNISOLONE SODIUM SUCC 125 MG IJ SOLR: 125.0000 mg | Freq: Once | INTRAMUSCULAR | Status: DC | PRN

## 2020-05-15 MED ORDER — SODIUM CHLORIDE 0.9 % IV SOLN: INTRAVENOUS | Status: DC | PRN

## 2020-05-15 MED ORDER — DIPHENHYDRAMINE HCL 50 MG/ML IJ SOLN: 50.0000 mg | Freq: Once | INTRAMUSCULAR | Status: DC | PRN

## 2020-05-15 MED ORDER — EPINEPHRINE 0.3 MG/0.3ML IJ SOAJ: 0.3000 mg | Freq: Once | INTRAMUSCULAR | Status: DC | PRN

## 2020-05-15 MED ORDER — ALBUTEROL SULFATE HFA 108 (90 BASE) MCG/ACT IN AERS: 2.0000 | INHALATION_SPRAY | Freq: Once | RESPIRATORY_TRACT | Status: DC | PRN

## 2020-05-15 MED ORDER — SOTROVIMAB 500 MG/8ML IV SOLN
500.0000 mg | Freq: Once | INTRAVENOUS | Status: AC
  Administered 2020-05-15: 500 mg via INTRAVENOUS

## 2020-05-15 MED ORDER — FAMOTIDINE IN NACL 20-0.9 MG/50ML-% IV SOLN: 20.0000 mg | Freq: Once | INTRAVENOUS | Status: DC | PRN

## 2020-05-15 NOTE — Telephone Encounter (Signed)
I connected by phone with Karen [name] on 05/15/2020 at 10:44 AM to discuss the potential use of a new treatment for mild to moderate COVID-19 viral infection in non-hospitalized patients.  This patient is a 50 y.o. female that meets the FDA criteria for Emergency Use Authorization of COVID monoclonal antibody sotrovimab.  Has a (+) direct SARS-CoV-2 viral test result  Has mild or moderate COVID-19   Is NOT hospitalized due to COVID-19  Is within 10 days of symptom onset  Has at least one of the high risk factor(s) for progression to severe COVID-19 and/or hospitalization as defined in EUA.  Specific high risk criteria : Immunosuppressive Disease or Treatment   I have spoken and communicated the following to the patient or parent/caregiver regarding COVID monoclonal antibody treatment:  1. FDA has authorized the emergency use for the treatment of mild to moderate COVID-19 in adults and pediatric patients with positive results of direct SARS-CoV-2 viral testing who are 40 years of age and older weighing at least 40 kg, and who are at high risk for progressing to severe COVID-19 and/or hospitalization.  2. The significant known and potential risks and benefits of COVID monoclonal antibody, and the extent to which such potential risks and benefits are unknown.  3. Information on available alternative treatments and the risks and benefits of those alternatives, including clinical trials.  4. Patients treated with COVID monoclonal antibody should continue to self-isolate and use infection control measures (e.g., wear mask, isolate, social distance, avoid sharing personal items, clean and disinfect "high touch" surfaces, and frequent handwashing) according to CDC guidelines.   5. The patient or parent/caregiver has the option to accept or refuse COVID monoclonal antibody treatment.  After reviewing this information with the patient, the patient has agreed to receive one of the available covid  19 monoclonal antibodies and will be provided an appropriate fact sheet prior to infusion. Karen Haddock, NP 05/15/2020 10:44 AM  Sx onset 1/2

## 2020-05-15 NOTE — Progress Notes (Signed)
  Diagnosis: COVID-19  Physician:  Dr. Patrick Wright   Procedure:  Sotrovimab administered via IV.  Patient provided with medication fact sheet prior to infusion and questions answered.  Patient provided with discharge instructions at discharge and all questions answered.  Complications: No immediate complications noted.  Discharge: Discharged home   Peirce Deveney Lynn 05/15/2020   

## 2020-05-15 NOTE — Discharge Instructions (Signed)
10 Things You Can Do to Manage Your COVID-19 Symptoms at Home If you have possible or confirmed COVID-19: 1. Stay home from work and school. And stay away from other public places. If you must go out, avoid using any kind of public transportation, ridesharing, or taxis. 2. Monitor your symptoms carefully. If your symptoms get worse, call your healthcare provider immediately. 3. Get rest and stay hydrated. 4. If you have a medical appointment, call the healthcare provider ahead of time and tell them that you have or may have COVID-19. 5. For medical emergencies, call 911 and notify the dispatch personnel that you have or may have COVID-19. 6. Cover your cough and sneezes with a tissue or use the inside of your elbow. 7. Wash your hands often with soap and water for at least 20 seconds or clean your hands with an alcohol-based hand sanitizer that contains at least 60% alcohol. 8. As much as possible, stay in a specific room and away from other people in your home. Also, you should use a separate bathroom, if available. If you need to be around other people in or outside of the home, wear a mask. 9. Avoid sharing personal items with other people in your household, like dishes, towels, and bedding. 10. Clean all surfaces that are touched often, like counters, tabletops, and doorknobs. Use household cleaning sprays or wipes according to the label instructions. cdc.gov/coronavirus 11/06/2018 This information is not intended to replace advice given to you by your health care provider. Make sure you discuss any questions you have with your health care provider. Document Revised: 04/10/2019 Document Reviewed: 04/10/2019 Elsevier Patient Education  2020 Elsevier Inc. What types of side effects do monoclonal antibody drugs cause?  Common side effects  In general, the more common side effects caused by monoclonal antibody drugs include: . Allergic reactions, such as hives or itching . Flu-like signs and  symptoms, including chills, fatigue, fever, and muscle aches and pains . Nausea, vomiting . Diarrhea . Skin rashes . Low blood pressure   The CDC is recommending patients who receive monoclonal antibody treatments wait at least 90 days before being vaccinated.  Currently, there are no data on the safety and efficacy of mRNA COVID-19 vaccines in persons who received monoclonal antibodies or convalescent plasma as part of COVID-19 treatment. Based on the estimated half-life of such therapies as well as evidence suggesting that reinfection is uncommon in the 90 days after initial infection, vaccination should be deferred for at least 90 days, as a precautionary measure until additional information becomes available, to avoid interference of the antibody treatment with vaccine-induced immune responses. If you have any questions or concerns after the infusion please call the Advanced Practice Provider on call at [phone]. This number is ONLY intended for your use regarding questions or concerns about the infusion post-treatment side-effects.  Please do not provide this number to others for use. For return to work notes please contact your primary care provider.   If someone you know is interested in receiving treatment please have them call the COVID hotline at [phone].   

## 2020-05-15 NOTE — Progress Notes (Signed)
Patient reviewed Fact Sheet for Patients, Parents, and Caregivers for Emergency Use Authorization (EUA) of sotrovimab for the Treatment of Coronavirus. Patient also reviewed and is agreeable to the estimated cost of treatment. Patient is agreeable to proceed.   

## 2020-05-18 ENCOUNTER — Other Ambulatory Visit: Payer: Self-pay | Admitting: Internal Medicine

## 2020-05-18 ENCOUNTER — Other Ambulatory Visit (HOSPITAL_COMMUNITY): Payer: Self-pay | Admitting: *Deleted

## 2020-05-18 ENCOUNTER — Other Ambulatory Visit (HOSPITAL_COMMUNITY): Payer: Self-pay | Admitting: Internal Medicine

## 2020-05-18 MED ORDER — SPIRONOLACTONE 25 MG PO TABS
25.0000 mg | ORAL_TABLET | Freq: Every day | ORAL | 2 refills | Status: DC
Start: 2020-05-18 — End: 2020-05-18

## 2020-05-18 MED FILL — BISOPROLOL FUMARATE 5 MG TA: 5 | 90 days supply | Qty: 90 | Fill #0

## 2020-05-18 MED FILL — SPIRONOLACTONE 25 MG TABS: 25 | 30 days supply | Qty: 30 | Fill #3

## 2020-05-19 ENCOUNTER — Other Ambulatory Visit: Payer: Self-pay

## 2020-05-19 ENCOUNTER — Ambulatory Visit (INDEPENDENT_AMBULATORY_CARE_PROVIDER_SITE_OTHER)
Admission: RE | Admit: 2020-05-19 | Discharge: 2020-05-19 | Disposition: A | Discharge status: Home / Self Care | Payer: 59 | Source: Ambulatory Visit | Attending: Pulmonary Disease | Admitting: Pulmonary Disease

## 2020-05-19 DIAGNOSIS — J849 Interstitial pulmonary disease, unspecified: Secondary | ICD-10-CM | POA: Diagnosis not present

## 2020-05-19 DIAGNOSIS — R0602 Shortness of breath: Secondary | ICD-10-CM | POA: Diagnosis not present

## 2020-05-20 ENCOUNTER — Other Ambulatory Visit: Payer: 59

## 2020-06-02 MED FILL — NP THYROID 90 MG TABLET: 90 | 30 days supply | Qty: 30 | Fill #3

## 2020-06-17 ENCOUNTER — Ambulatory Visit (INDEPENDENT_AMBULATORY_CARE_PROVIDER_SITE_OTHER): Payer: 59 | Admitting: Internal Medicine

## 2020-07-01 ENCOUNTER — Ambulatory Visit (INDEPENDENT_AMBULATORY_CARE_PROVIDER_SITE_OTHER): Payer: 59 | Admitting: Internal Medicine

## 2020-07-06 MED FILL — SPIRONOLACTONE 25 MG TABS: 25 | 90 days supply | Qty: 90 | Fill #0

## 2020-07-08 ENCOUNTER — Other Ambulatory Visit: Payer: Self-pay

## 2020-07-08 ENCOUNTER — Ambulatory Visit (HOSPITAL_COMMUNITY)
Admission: RE | Admit: 2020-07-08 | Discharge: 2020-07-08 | Disposition: A | Discharge status: Home / Self Care | Payer: 59 | Source: Ambulatory Visit | Attending: Internal Medicine | Admitting: Internal Medicine

## 2020-07-08 ENCOUNTER — Encounter (HOSPITAL_COMMUNITY): Payer: Self-pay | Admitting: Internal Medicine

## 2020-07-08 VITALS — BP 130/80 | HR 85 | Wt 133.0 lb

## 2020-07-08 DIAGNOSIS — I10 Essential (primary) hypertension: Secondary | ICD-10-CM | POA: Diagnosis not present

## 2020-07-08 DIAGNOSIS — I5032 Chronic diastolic (congestive) heart failure: Secondary | ICD-10-CM | POA: Diagnosis not present

## 2020-07-08 DIAGNOSIS — I272 Pulmonary hypertension, unspecified: Secondary | ICD-10-CM | POA: Diagnosis not present

## 2020-07-08 DIAGNOSIS — J8410 Pulmonary fibrosis, unspecified: Secondary | ICD-10-CM | POA: Insufficient documentation

## 2020-07-08 DIAGNOSIS — M349 Systemic sclerosis, unspecified: Secondary | ICD-10-CM | POA: Diagnosis not present

## 2020-07-08 DIAGNOSIS — I5022 Chronic systolic (congestive) heart failure: Secondary | ICD-10-CM

## 2020-07-08 DIAGNOSIS — R9431 Abnormal electrocardiogram [ECG] [EKG]: Secondary | ICD-10-CM | POA: Insufficient documentation

## 2020-07-08 DIAGNOSIS — J849 Interstitial pulmonary disease, unspecified: Secondary | ICD-10-CM

## 2020-07-08 DIAGNOSIS — I110 Hypertensive heart disease with heart failure: Secondary | ICD-10-CM | POA: Diagnosis not present

## 2020-07-08 HISTORY — DX: Heart failure, unspecified: I50.9

## 2020-07-08 NOTE — Progress Notes (Signed)
Heart Failure Clinic Note  Date:  07/08/2020   ID:  Karen [name], DOB [birth year], MRN [id number]  Location: Home  Provider location: [facility] Type of Visit: Established patient  PCP:  [name], MD  Cardiologist:  Mertie Moores, MD Primary HF: Karen [name]  Chief Complaint: Heart Failure follow-up   History of Present Illness:  HPI: Karen [name] is a 50 y.o. female Karen [name] at [facility] (with [name]) with a history of Sjogren's syndrome, multinodular goiter, HTN and recently diagnosed scleroderma referred by Dr. Gavin Pound for evaluation of pulmonary HTN in the setting of scleroderma.  RHC in 5/19 with minimally elevated pressures and hi-res CT which showed ILD. Follows with Dr. Vaughan Browner . F/u CT in 2/20 showed stable ILD  We saw her in 9/20 markedly SOB and ReDS very high @ 49%. CXR with mild pulmonary vascular congestion. ESR normal. Started lasix 40 daily and kcl 20 daily. Fluid got much better. Then switched to lasix 40 mg MWF but that wasn't enough so lasix increased to 5 days per week.   Started macitentan in May 21 after RHC.   Returns for f/u. Feeling better. Back to working FT. On Cellcept. Feels macitentan has helped some. Walking some. Mild DOE if she walks too fast or too far. Edema much improved. No dizziness. Takes lasix only as needed. No syncope or presyncope.   Echo 10/21 EF 65-70% RV ok  Hi-res CT 2/21 and 1/22: Stable ILD  Studies:  RHC 4/21  RA = 1 RV = 37/7 PA = 38/16 (25) PCW = 6 Fick cardiac output/index = 5.5/3.4 PVR = 3.4 WU Ao sat = 97% PA sat = 74%, 74% High SVC = 70%  Echo 11/18/18: EF hyperdynamic 65-70% RV normal   Echo 5/19: EF 60-65% grade I DD RV normal. Mild TR.   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 Aortic atherosclerosis, three-vessel coronary artery disease.  CT high-resolution 06/27/2018-stable interstitial lung disease. I reviewed the images personally.  Hi res CT 02/05/19: stable ILD   PFTs  FENO 09/06/2017-unable to complete  07/27/2017 FVC 1.65 (54%), FEV1 1.53 (62%], F/F 93, TLC 50, DLCO 44%, DLCO/VA 131% 10/25/2017 FVC 1.51 [50%], FEV1 1.26 [51%], F/F 83 01/29/2018 FVC 1.69 [5%), FEV1 1.50 [61%], F/F 89, DLCO 10.57 [41%] 01/06/2019 FVC 1.58 [52%], FEV1 1.46 [60%],F/F 92, TLC 2.62 [50%], DLCO 9.16 [42%] Severe restriction and diffusion impairment. FVC is stable but DLCO has worsened.  6-minute walk  10/23/2017- 144 m Post walk heart rate, stats 94, 91%  02/04/2018-249 m Post walk heart rate, sats 101, 99%  06/06/2018-212 m Post walk heart rate, sats 86, 92%  01/30/2019-147m Post walk heart rate, sats 83, 98%      Past Medical History:  Diagnosis Date  . CHF (congestive heart failure) ([place])   . GERD (gastroesophageal reflux disease)   . Hypertension   . Hypothyroidism   . ILD (interstitial lung disease) ([place]) 02/08/2018  . Interstitial lung disease ([place])   . Multinodular thyroid    benign FNA 08/2017.  Marland Kitchen Perimenopause 02/27/2019  . Scleroderma ([place])   . Vitamin D deficiency disease 02/27/2019   Past Surgical History:  Procedure  Laterality Date  . ABDOMINAL HERNIA REPAIR    . BUNIONECTOMY Bilateral 10/2018  . CHOLECYSTECTOMY N/A 02/13/2020   Procedure: LAPAROSCOPIC CHOLECYSTECTOMY;  Surgeon: Aviva Signs, MD;  Location: [facility];  Service: General;  Laterality: N/A;  . COLONOSCOPY WITH PROPOFOL N/A 01/02/2019   Procedure: COLONOSCOPY WITH PROPOFOL;  Surgeon: Daneil Dolin, MD;  Location[facility];  Service: Endoscopy;  Laterality: N/A;  12:30pm  . ESOPHAGOGASTRODUODENOSCOPY (EGD) WITH PROPOFOL N/A 01/28/2018   erosive reflux esophagitis, patulous EG Junction, no dilation, incomplete  EGD due to retained food in stomach. GES thereafter with delayed gastric emptying.   Marland Kitchen RIGHT HEART CATH N/A 09/27/2017   Procedure: RIGHT HEART CATH;  Surgeon: Jolaine Artist, MD;  Location: [facility];  Service: Cardiovascular;  Laterality: N/A;  . RIGHT HEART CATH N/A 09/05/2019   Procedure: RIGHT HEART CATH;  Surgeon: Jolaine Artist, MD;  Location: [facility];  Service: Cardiovascular;  Laterality: N/A;  . UTERINE FIBROID SURGERY       Current Outpatient Medications  Medication Sig Dispense Refill  . acetaminophen (TYLENOL) 325 MG tablet Take 2 tablets (650 mg total) by mouth every 6 (six) hours as needed for mild pain, fever or headache (or Fever >/= 101). 12 tablet 0  . azelastine (OPTIVAR) 0.05 % ophthalmic solution Place 1 drop into both eyes daily as needed (allergies).     . bisoprolol (ZEBETA) 5 MG tablet TAKE 1 TABLET (5 MG TOTAL) BY MOUTH DAILY. 90 tablet 2  . Cholecalciferol (VITAMIN D-3) 5000 units TABS Take 10,000 Units by mouth daily.     Marland Kitchen esomeprazole (NEXIUM) 40 MG capsule TAKE 1 CAPSULE BY MOUTH TWO TIMES DAILY 180 capsule 3  . furosemide (LASIX) 40 MG tablet Take 40 mg by mouth as needed for edema. Except Saturdays and Sundays    . macitentan (OPSUMIT) 10 MG tablet Take 1 tablet (10 mg total) by mouth daily. 30 tablet 11  . Multiple Vitamins-Minerals (MEGA MULTIVITAMIN) POWD Take 1 Scoop by mouth daily. Mix 1 scoop in water    . mycophenolate (CELLCEPT) 500 MG tablet TAKE 3 TABLETS BY MOUTH IN THE MORNING AND 3 TABLETS IN THE EVENING 170 tablet 1  . Nintedanib (OFEV) 150 MG CAPS Take 1 capsule (150 mg total) by mouth 2 (two) times daily. 60 capsule 2  . NP THYROID 90 MG tablet Take 1 tablet (90 mg total) by mouth daily. 30 tablet 3  . ondansetron (ZOFRAN) 4 MG tablet Take 1 tablet (4 mg total) by mouth every 8 (eight) hours as needed for nausea or vomiting. 20 tablet 0  . polyethylene glycol (MIRALAX / GLYCOLAX) 17 g packet Take 17 g by mouth daily.  14 each 0  . potassium chloride 20 MEQ/15ML (10%) SOLN TAKE 15 MLS (20 MEQ TOTAL) BY MOUTH DAILY. 473 mL 11  . senna-docusate (SENOKOT-S) 8.6-50 MG tablet Take 2 tablets by mouth at bedtime. 60 tablet 1  . spironolactone (ALDACTONE) 25 MG tablet Take 1 tablet (25 mg total) by mouth daily. 90 tablet 2   No current facility-administered medications for this encounter.    Allergies:   Lisinopril   Social History:  The patient  reports that she has never smoked. She has never used smokeless tobacco. She reports that she does not drink alcohol and does not use drugs.   Family History:  The patient's family history includes Diabetes in her maternal grandfather, maternal uncle, and mother; Hypertension in her brother, father, and sister.  ROS:  Please see the history of present illness.   All other systems are personally reviewed and negative.   Vitals:   07/08/20 1442  BP: 130/80  Pulse: 85  SpO2: 100%  Weight: 60.3 kg (133 lb)    Exam:   General:  Well appearing. No resp difficulty HEENT: normal mild skin thickening Neck: supple. no JVD. Carotids 2+ bilat; no bruits. No lymphadenopathy or thryomegaly appreciated. Cor: PMI nondisplaced. Regular rate & rhythm. No rubs, gallops or murmurs. Lungs: clear Abdomen: soft, nontender, nondistended. No hepatosplenomegaly. No bruits or masses. Good bowel sounds. Extremities: no cyanosis, clubbing, rash, edema Neuro: alert & orientedx3, cranial nerves grossly intact. moves all 4 extremities w/o difficulty. Affect pleasant    Recent Labs: 02/15/2020: Magnesium 1.9 03/15/2020: ALT 7; BUN 11; Creat 0.65; Hemoglobin 12.4; Platelets 153; Potassium 4.2; Sodium 142  Personally reviewed   Wt Readings from Last 3 Encounters:  07/08/20 60.3 kg (133 lb)  03/16/20 60.3 kg (133 lb)  03/11/20 58.8 kg (129 lb 9.6 oz)      ASSESSMENT AND PLAN:  1. Chronic diastolic HF - Fluid status much improved now taking lasix just PRN - Echo 10/21 EF 65-70%  Grade 1 DD RV ok  2. PAH - mild - tolerating macitentan well. Will not add PDE-5 yet  3. HTN - Blood pressure well controlled. Continue current regimen.  4. Scleroderma - Followed by Dr Trudie Reed - continue Cellcept - No change currently  5. Pulmonary fibrosis - High res CT chest c/w with ILD. Repeat stable 2/21 and 1/22 - Followed by Dr Vaughan Browner - Continue Ofev & Cellcept  Signed, Glori Bickers, MD  07/08/2020 3:09 PM  [facility] [place] [address] Heart and [place] [phone] (office) [phone] (fax)

## 2020-07-08 NOTE — Patient Instructions (Signed)
It was nice to see you today  Your physician has requested that you have an echocardiogram. Echocardiography is a painless test that uses sound waves to create images of your heart. It provides your doctor with information about the size and shape of your heart and how well your heart's chambers and valves are working. This procedure takes approximately one hour. There are no restrictions for this procedure.   Your physician recommends that you schedule a follow-up appointment in: 6 months with echocardiogram Please call our office in August to schedule an appointment  If you have any questions or concerns before your next appointment please send [place] a message through [place] or call our office at [phone].    TO LEAVE A MESSAGE FOR THE NURSE SELECT OPTION 2, PLEASE LEAVE A MESSAGE INCLUDING: . YOUR NAME . DATE OF BIRTH . CALL BACK NUMBER . REASON FOR CALL**this is important as we prioritize the call backs  [place] AS LONG AS YOU CALL BEFORE 4:00 PM  At the [facility], you and your health needs are our priority. As part of our continuing mission to provide you with exceptional heart care, we have created designated Provider Care Teams. These Care Teams include your primary Cardiologist (physician) and Advanced Practice Providers (APPs- Physician Assistants and Nurse Practitioners) who all work together to provide you with the care you need, when you need it.   You may see any of the following providers on your designated Care Team at your next follow up: Marland Kitchen Dr Glori Bickers . Dr Loralie Champagne . Dr Vickki Muff . Darrick Grinder, NP . Lyda Jester, [place] . Audry Riles, PharmD   Please be sure to bring in all your medications bottles to every appointment.

## 2020-07-15 ENCOUNTER — Other Ambulatory Visit: Payer: Self-pay

## 2020-07-15 ENCOUNTER — Ambulatory Visit (INDEPENDENT_AMBULATORY_CARE_PROVIDER_SITE_OTHER): Payer: 59 | Admitting: Internal Medicine

## 2020-07-15 ENCOUNTER — Encounter (INDEPENDENT_AMBULATORY_CARE_PROVIDER_SITE_OTHER): Payer: Self-pay | Admitting: Internal Medicine

## 2020-07-15 VITALS — BP 144/90 | HR 67 | Temp 97.3°F | Resp 17 | Ht 65.0 in | Wt 133.0 lb

## 2020-07-15 DIAGNOSIS — I10 Essential (primary) hypertension: Secondary | ICD-10-CM

## 2020-07-15 DIAGNOSIS — E039 Hypothyroidism, unspecified: Secondary | ICD-10-CM | POA: Diagnosis not present

## 2020-07-15 NOTE — Progress Notes (Signed)
Metrics: Intervention Frequency ACO  Documented Smoking Status Yearly  Screened one or more times in 24 months  Cessation Counseling or  Active cessation medication Past 24 months  Past 24 months   Guideline developer: UpToDate (See UpToDate for funding source) Date Released: 2014       Wellness Office Visit  Subjective:  Patient ID: Karen [name], female    DOB: 1970-05-17  Age: 50 y.o. MRN: [id number]  CC: This lady comes in for follow-up of hypertension, hypothyroidism. HPI  She continues on NP thyroid 90 mg daily and still has cold peripheries although it certainly is better than it was before. Continues on bisoprolol for hypertension. She appears to be doing well today and has no specific complaints. She did have COVID-19 disease in January of this year and was treated with monoclonal antibody infusion which means that she can get her booster dose around April of the COVID-19 vaccine. Past Medical History:  Diagnosis Date  . CHF (congestive heart failure) ([place])   . GERD (gastroesophageal reflux disease)   . Hypertension   . Hypothyroidism   . ILD (interstitial lung disease) ([place]) 02/08/2018  . Interstitial lung disease ([place])   . Multinodular thyroid    benign FNA 08/2017.  Marland Kitchen Perimenopause 02/27/2019  . Scleroderma ([place])   . Vitamin D deficiency disease 02/27/2019   Past Surgical History:  Procedure Laterality Date  . ABDOMINAL HERNIA REPAIR    . BUNIONECTOMY Bilateral 10/2018  . CHOLECYSTECTOMY N/A 02/13/2020   Procedure: LAPAROSCOPIC CHOLECYSTECTOMY;  Surgeon: Aviva Signs, MD;  Location: [facility] OR[facility];  Service: General;  Laterality: N/A;  . COLONOSCOPY WITH PROPOFOL N/A 01/02/2019   Procedure: COLONOSCOPY WITH PROPOFOL;  Surgeon: Daneil Dolin, MD;  Location: [facility];  Service: Endoscopy;  Laterality: N/A;  12:30pm  . ESOPHAGOGASTRODUODENOSCOPY (EGD) WITH PROPOFOL N/A 01/28/2018   erosive reflux esophagitis, patulous EG Junction, no dilation, incomplete EGD due to  retained food in stomach. GES thereafter with delayed gastric emptying.   Marland Kitchen RIGHT HEART CATH N/A 09/27/2017   Procedure: RIGHT HEART CATH;  Surgeon: Jolaine Artist, MD;  Location: [facility];  Service: Cardiovascular;  Laterality: N/A;  . RIGHT HEART CATH N/A 09/05/2019   Procedure: RIGHT HEART CATH;  Surgeon: Jolaine Artist, MD;  Location: [facility];  Service: Cardiovascular;  Laterality: N/A;  . UTERINE FIBROID SURGERY       Family History  Problem Relation Age of Onset  . Hypertension Father   . Hypertension Sister   . Hypertension Brother   . Diabetes Maternal Grandfather   . Diabetes Maternal Uncle   . Diabetes Mother   . Colon cancer Neg Hx     Social History   Social History Narrative   Patient is right-handed. She lives alone in one level home, a few steps to enter.CMA [facility].   Social History   Tobacco Use  . Smoking status: Never Smoker  . Smokeless tobacco: Never Used  Substance Use Topics  . Alcohol use: No    Current Meds  Medication Sig  . acetaminophen (TYLENOL) 325 MG tablet Take 2 tablets (650 mg total) by mouth every 6 (six) hours as needed for mild pain, fever or headache (or Fever >/= 101).  Marland Kitchen azelastine (OPTIVAR) 0.05 % ophthalmic solution Place 1 drop into both eyes daily as needed (allergies).   . bisoprolol (ZEBETA) 5 MG tablet TAKE 1 TABLET (5 MG TOTAL) BY MOUTH DAILY.  Marland Kitchen Cholecalciferol (VITAMIN D-3) 5000 units TABS  Take 10,000 Units by mouth daily.   . clobetasol ointment (TEMOVATE) 0.05 % Apply to scalp three times weekly.  Marland Kitchen esomeprazole (NEXIUM) 40 MG capsule TAKE 1 CAPSULE BY MOUTH TWO TIMES DAILY  . furosemide (LASIX) 40 MG tablet Take 40 mg by mouth as needed for edema. Except Saturdays and Sundays  . macitentan (OPSUMIT) 10 MG tablet Take 1 tablet (10 mg total) by mouth daily.  . Multiple Vitamins-Minerals (MEGA MULTIVITAMIN) POWD Take 1 Scoop by mouth daily. Mix 1 scoop in water  . mycophenolate  (CELLCEPT) 500 MG tablet TAKE 3 TABLETS BY MOUTH IN THE MORNING AND 3 TABLETS IN THE EVENING  . Nintedanib (OFEV) 150 MG CAPS Take 1 capsule (150 mg total) by mouth 2 (two) times daily.  . NP THYROID 90 MG tablet Take 1 tablet (90 mg total) by mouth daily.  . ondansetron (ZOFRAN) 4 MG tablet Take 1 tablet (4 mg total) by mouth every 8 (eight) hours as needed for nausea or vomiting.  . polyethylene glycol (MIRALAX / GLYCOLAX) 17 g packet Take 17 g by mouth daily.  . potassium chloride 20 MEQ/15ML (10%) SOLN TAKE 15 MLS (20 MEQ TOTAL) BY MOUTH DAILY.  Marland Kitchen senna-docusate (SENOKOT-S) 8.6-50 MG tablet Take 2 tablets by mouth at bedtime.  Marland Kitchen spironolactone (ALDACTONE) 25 MG tablet Take 1 tablet (25 mg total) by mouth daily.  . tacrolimus (PROTOPIC) 0.1 % ointment Apply to dry skin on face and body daily to twice daily as needed.  . triamcinolone ointment (KENALOG) 0.1 % Apply to affected itching areas on body twice daily for 2 wks, then daily for 2 wks, then every other day for 2 wks. Not to face.     Flowsheet Row Pulmonary Rehab Walk Test from 06/19/2019 in [place]  PHQ-9 Total Score 3      Objective:   Today's Vitals: BP (!) 144/90 (BP Location: Left Arm, Patient Position: Sitting, Cuff Size: Normal)   Pulse 67   Temp (!) 97.3 F (36.3 C) (Temporal)   Resp 17   Ht 5\' 5"  (1.651 m)   Wt 133 lb (60.3 kg)   BMI 22.13 kg/m  Vitals with BMI 07/15/2020 07/08/2020 05/15/2020  Height 5\' 5"  - -  Weight 133 lbs 133 lbs -  BMI 73.53 - -  Systolic 299 242 683  Diastolic 90 80 96  Pulse 67 85 78     Physical Exam   She looks systemically well.  Blood pressure slightly elevated today but she was rushing to get here.  No other new physical findings.    Assessment   1. Hypothyroidism, adult   2. Essential hypertension       Tests ordered Orders Placed This Encounter  Procedures  . COMPLETE METABOLIC PANEL WITH GFR  . T3, free  . TSH      Plan: 1. She will continue with NP thyroid 90 mg daily and we will check thyroid function.  We may adjust the dose upward still depending on results. 2. Continue with antihypertensive medications as before. 3. Further recommendations will depend on blood results and I will see her in about 4 months time for follow-up   No orders of the defined types were placed in this encounter.   [name], MD

## 2020-07-16 ENCOUNTER — Other Ambulatory Visit (INDEPENDENT_AMBULATORY_CARE_PROVIDER_SITE_OTHER): Payer: Self-pay | Admitting: Internal Medicine

## 2020-07-16 LAB — COMPLETE METABOLIC PANEL WITH GFR
AG Ratio: 1.3 (calc) (ref 1.0–2.5)
ALT: 8 U/L (ref 6–29)
AST: 15 U/L (ref 10–35)
Albumin: 4.1 g/dL (ref 3.6–5.1)
Alkaline phosphatase (APISO): 83 U/L (ref 37–153)
BUN: 14 mg/dL (ref 7–25)
CO2: 24 mmol/L (ref 20–32)
Calcium: 10.4 mg/dL (ref 8.6–10.4)
Chloride: 107 mmol/L (ref 98–110)
Creat: 0.57 mg/dL (ref 0.50–1.05)
GFR, Est African American: 124 mL/min/{1.73_m2} (ref >=60)
GFR, Est Non African American: 107 mL/min/{1.73_m2} (ref >=60)
Globulin: 3.2 g/dL (calc) (ref 1.9–3.7)
Glucose, Bld: 77 mg/dL (ref 65–139)
Potassium: 4.1 mmol/L (ref 3.5–5.3)
Sodium: 142 mmol/L (ref 135–146)
Total Bilirubin: 0.4 mg/dL (ref 0.2–1.2)
Total Protein: 7.3 g/dL (ref 6.1–8.1)

## 2020-07-16 LAB — TSH: TSH: 0.56 mIU/L

## 2020-07-16 LAB — T3, FREE: T3, Free: 3.1 pg/mL (ref 2.3–4.2)

## 2020-07-16 MED ORDER — NP THYROID 120 MG PO TABS: 120.0000 mg | ORAL_TABLET | Freq: Every day | ORAL | 3 refills | Status: DC

## 2020-07-16 MED FILL — NP THYROID 120 MG TABLET: 120 | 30 days supply | Qty: 30 | Fill #0

## 2020-07-21 NOTE — Progress Notes (Signed)
Please call the patient and let her know what I have written on my chart regarding her NP thyroid dose.  Thanks.

## 2020-08-04 MED FILL — ESOMEPRAZOLE MAG DR 40 MG C: 40 | 30 days supply | Qty: 60 | Fill #2

## 2020-08-07 ENCOUNTER — Other Ambulatory Visit (HOSPITAL_COMMUNITY): Payer: Self-pay

## 2020-09-01 DIAGNOSIS — H5213 Myopia, bilateral: Secondary | ICD-10-CM | POA: Diagnosis not present

## 2020-09-16 DIAGNOSIS — Z01818 Encounter for other preprocedural examination: Secondary | ICD-10-CM | POA: Diagnosis not present

## 2020-09-16 DIAGNOSIS — Z7682 Awaiting organ transplant status: Secondary | ICD-10-CM | POA: Diagnosis not present

## 2020-09-16 DIAGNOSIS — M349 Systemic sclerosis, unspecified: Secondary | ICD-10-CM | POA: Diagnosis not present

## 2020-10-07 ENCOUNTER — Other Ambulatory Visit (HOSPITAL_COMMUNITY): Payer: Self-pay

## 2020-10-07 DIAGNOSIS — Z113 Encounter for screening for infections with a predominantly sexual mode of transmission: Secondary | ICD-10-CM | POA: Diagnosis not present

## 2020-10-07 DIAGNOSIS — Z3A21 21 weeks gestation of pregnancy: Secondary | ICD-10-CM | POA: Diagnosis not present

## 2020-10-07 DIAGNOSIS — D250 Submucous leiomyoma of uterus: Secondary | ICD-10-CM | POA: Diagnosis not present

## 2020-10-07 DIAGNOSIS — Z01419 Encounter for gynecological examination (general) (routine) without abnormal findings: Secondary | ICD-10-CM | POA: Diagnosis not present

## 2020-10-07 DIAGNOSIS — Z1389 Encounter for screening for other disorder: Secondary | ICD-10-CM | POA: Diagnosis not present

## 2020-10-07 DIAGNOSIS — R61 Generalized hyperhidrosis: Secondary | ICD-10-CM | POA: Diagnosis not present

## 2020-10-07 DIAGNOSIS — Z130 Encounter for screening for diseases of the blood and blood-forming organs and certain disorders involving the immune mechanism: Secondary | ICD-10-CM | POA: Diagnosis not present

## 2020-10-07 DIAGNOSIS — Z1231 Encounter for screening mammogram for malignant neoplasm of breast: Secondary | ICD-10-CM | POA: Diagnosis not present

## 2020-10-07 MED ORDER — PAROXETINE HCL 10 MG PO TABS
10.0000 mg | ORAL_TABLET | Freq: Every day | ORAL | 2 refills | Status: DC
  Filled 2020-10-07: qty 60, 60d supply, fill #0

## 2020-10-13 ENCOUNTER — Telehealth (HOSPITAL_COMMUNITY): Payer: Self-pay | Admitting: Pharmacy Technician

## 2020-10-13 NOTE — Telephone Encounter (Signed)
Patient Advocate Encounter   Received notification from [employer] that prior authorization for [employer] is required.   PA submitted on CoverMyMeds Key [id number] Status is pending   Will continue to follow.

## 2020-10-15 ENCOUNTER — Other Ambulatory Visit (HOSPITAL_COMMUNITY): Payer: Self-pay

## 2020-10-17 DIAGNOSIS — S63602A Unspecified sprain of left thumb, initial encounter: Secondary | ICD-10-CM | POA: Diagnosis not present

## 2020-10-17 DIAGNOSIS — M79632 Pain in left forearm: Secondary | ICD-10-CM | POA: Diagnosis not present

## 2020-10-17 DIAGNOSIS — Z79899 Other long term (current) drug therapy: Secondary | ICD-10-CM | POA: Diagnosis not present

## 2020-10-17 DIAGNOSIS — S0990XA Unspecified injury of head, initial encounter: Secondary | ICD-10-CM | POA: Diagnosis not present

## 2020-10-17 DIAGNOSIS — M79642 Pain in left hand: Secondary | ICD-10-CM | POA: Diagnosis not present

## 2020-10-17 DIAGNOSIS — S6992XA Unspecified injury of left wrist, hand and finger(s), initial encounter: Secondary | ICD-10-CM | POA: Diagnosis not present

## 2020-10-17 DIAGNOSIS — J811 Chronic pulmonary edema: Secondary | ICD-10-CM | POA: Diagnosis not present

## 2020-10-17 DIAGNOSIS — M25522 Pain in left elbow: Secondary | ICD-10-CM | POA: Diagnosis not present

## 2020-10-17 DIAGNOSIS — I10 Essential (primary) hypertension: Secondary | ICD-10-CM | POA: Diagnosis not present

## 2020-10-19 NOTE — Telephone Encounter (Signed)
Advanced Heart Failure Patient Advocate Encounter  Prior Authorization for [employer] has been approved.    PA# [id number] Effective dates: 10/13/20 through 10/12/20  Charlann Boxer, CPhT

## 2020-10-20 DIAGNOSIS — S63642A Sprain of metacarpophalangeal joint of left thumb, initial encounter: Secondary | ICD-10-CM | POA: Diagnosis not present

## 2020-11-01 ENCOUNTER — Other Ambulatory Visit (HOSPITAL_COMMUNITY): Payer: Self-pay

## 2020-11-01 MED FILL — Bisoprolol Fumarate Tab 5 MG: ORAL | 90 days supply | Qty: 90 | Fill #0 | Status: AC

## 2020-11-04 DIAGNOSIS — M79645 Pain in left finger(s): Secondary | ICD-10-CM | POA: Diagnosis not present

## 2020-11-04 DIAGNOSIS — S63642A Sprain of metacarpophalangeal joint of left thumb, initial encounter: Secondary | ICD-10-CM | POA: Diagnosis not present

## 2020-11-04 DIAGNOSIS — M25532 Pain in left wrist: Secondary | ICD-10-CM | POA: Diagnosis not present

## 2020-11-11 DIAGNOSIS — S63642A Sprain of metacarpophalangeal joint of left thumb, initial encounter: Secondary | ICD-10-CM | POA: Diagnosis not present

## 2020-11-18 ENCOUNTER — Other Ambulatory Visit: Payer: Self-pay

## 2020-11-18 ENCOUNTER — Encounter (INDEPENDENT_AMBULATORY_CARE_PROVIDER_SITE_OTHER): Payer: Self-pay | Admitting: Internal Medicine

## 2020-11-18 ENCOUNTER — Ambulatory Visit (INDEPENDENT_AMBULATORY_CARE_PROVIDER_SITE_OTHER): Payer: 59 | Admitting: Internal Medicine

## 2020-11-18 VITALS — BP 132/80 | HR 75 | Temp 97.8°F | Resp 18 | Ht 65.0 in | Wt 138.0 lb

## 2020-11-18 DIAGNOSIS — I10 Essential (primary) hypertension: Secondary | ICD-10-CM

## 2020-11-18 DIAGNOSIS — N951 Menopausal and female climacteric states: Secondary | ICD-10-CM | POA: Diagnosis not present

## 2020-11-18 DIAGNOSIS — E559 Vitamin D deficiency, unspecified: Secondary | ICD-10-CM | POA: Diagnosis not present

## 2020-11-18 DIAGNOSIS — E039 Hypothyroidism, unspecified: Secondary | ICD-10-CM | POA: Diagnosis not present

## 2020-11-18 DIAGNOSIS — R61 Generalized hyperhidrosis: Secondary | ICD-10-CM

## 2020-11-18 NOTE — Progress Notes (Signed)
Metrics: Intervention Frequency ACO  Documented Smoking Status Yearly  Screened one or more times in 24 months  Cessation Counseling or  Active cessation medication Past 24 months  Past 24 months   Guideline developer: UpToDate (See UpToDate for funding source) Date Released: 2014       Wellness Office Visit  Subjective:  Patient ID: Karen [name], female    DOB: Jun 20, 1970  Age: 50 y.o. MRN: [id number]  CC: This lady comes in for follow-up of hypothyroidism, hypertension, vitamin D deficiency. HPI  Apparently her vitamin D 3 therapy was discontinued by another physician because calcium levels were reportedly elevated. She has tolerated the higher dose of NP thyroid and seems to be improving and how she feels. She now has a new symptom for the last couple of months which is that she has quite severe hot flashes and night sweats.  She says the that have been present for a while now but much worse in the last 2 months.  Her last menstrual period was more than a year ago.  She still has an intact uterus. Past Medical History:  Diagnosis Date   CHF (congestive heart failure) ([facility])    GERD (gastroesophageal reflux disease)    Hypertension    Hypothyroidism    ILD (interstitial lung disease) ([place]) 02/08/2018   Interstitial lung disease ([place])    Multinodular thyroid    benign FNA 08/2017.   Perimenopause 02/27/2019   Scleroderma ([place])    Vitamin D deficiency disease 02/27/2019   Past Surgical History:  Procedure Laterality Date   ABDOMINAL HERNIA REPAIR     BUNIONECTOMY Bilateral 10/2018   CHOLECYSTECTOMY N/A 02/13/2020   Procedure: LAPAROSCOPIC CHOLECYSTECTOMY;  Surgeon: Aviva Signs, MD;  Location: [facility];  Service: General;  Laterality: N/A;   COLONOSCOPY WITH PROPOFOL N/A 01/02/2019   Procedure: COLONOSCOPY WITH PROPOFOL;  Surgeon: Daneil Dolin, MD;  [facility];  Service: Endoscopy;  Laterality: N/A;  12:30pm   ESOPHAGOGASTRODUODENOSCOPY (EGD) WITH PROPOFOL N/A  01/28/2018   erosive reflux esophagitis, patulous EG Junction, no dilation, incomplete EGD due to retained food in stomach. GES thereafter with delayed gastric emptying.    RIGHT HEART CATH N/A 09/27/2017   Procedure: RIGHT HEART CATH;  Surgeon: Jolaine Artist, MD;  Location: [facility];  Service: Cardiovascular;  Laterality: N/A;   RIGHT HEART CATH N/A 09/05/2019   Procedure: RIGHT HEART CATH;  Surgeon: Jolaine Artist, MD;  Location: [facility];  Service: Cardiovascular;  Laterality: N/A;   UTERINE FIBROID SURGERY       Family History  Problem Relation Age of Onset   Hypertension Father    Hypertension Sister    Hypertension Brother    Diabetes Maternal Grandfather    Diabetes Maternal Uncle    Diabetes Mother    Colon cancer Neg Hx     Social History   Social History Narrative   Patient is right-handed. She lives alone in one level home, a few steps to enter.CMA [facility].   Social History   Tobacco Use   Smoking status: Never   Smokeless tobacco: Never  Substance Use Topics   Alcohol use: No    Current Meds  Medication Sig   acetaminophen (TYLENOL) 325 MG tablet Take 2 tablets (650 mg total) by mouth every 6 (six) hours as needed for mild pain, fever or headache (or Fever >/= 101).   azelastine (OPTIVAR) 0.05 % ophthalmic solution Place 1 drop into both eyes daily as  needed (allergies).    bisoprolol (ZEBETA) 5 MG tablet TAKE 1 TABLET (5 MG TOTAL) BY MOUTH DAILY.   Cholecalciferol (VITAMIN D-3) 5000 units TABS Take 10,000 Units by mouth daily.    clindamycin-benzoyl peroxide (BENZACLIN) gel APPLY TO FACE DAILY   clobetasol ointment (TEMOVATE) 0.05 % Apply to scalp three times weekly.   clobetasol ointment (TEMOVATE) 0.05 % APPLY TO SCALP THREE TIMES WEEKLY.   esomeprazole (NEXIUM) 40 MG capsule TAKE 1 CAPSULE BY MOUTH TWO TIMES DAILY   furosemide (LASIX) 40 MG tablet Take 40 mg by mouth as needed for edema. Except Saturdays and Sundays    macitentan (OPSUMIT) 10 MG tablet Take 1 tablet (10 mg total) by mouth daily.   Multiple Vitamins-Minerals (MEGA MULTIVITAMIN) POWD Take 1 Scoop by mouth daily. Mix 1 scoop in water   mycophenolate (CELLCEPT) 500 MG tablet TAKE 3 TABLETS BY MOUTH IN THE MORNING AND 3 TABLETS IN THE EVENING   Nintedanib (OFEV) 150 MG CAPS Take 1 capsule (150 mg total) by mouth 2 (two) times daily.   NP THYROID 120 MG tablet TAKE 1 TABLET (120 MG TOTAL) BY MOUTH DAILY BEFORE BREAKFAST.   ondansetron (ZOFRAN) 4 MG tablet Take 1 tablet (4 mg total) by mouth every 8 (eight) hours as needed for nausea or vomiting.   PARoxetine (PAXIL) 10 MG tablet Take 1 tablet (10 mg total) by mouth daily as directed   polyethylene glycol (MIRALAX / GLYCOLAX) 17 g packet Take 17 g by mouth daily.   potassium chloride 20 MEQ/15ML (10%) SOLN TAKE 15 MLS (20 MEQ TOTAL) BY MOUTH DAILY.   senna-docusate (SENOKOT-S) 8.6-50 MG tablet Take 2 tablets by mouth at bedtime.   spironolactone (ALDACTONE) 25 MG tablet TAKE 1 TABLET (25 MG TOTAL) BY MOUTH DAILY.   tacrolimus (PROTOPIC) 0.1 % ointment Apply to dry skin on face and body daily to twice daily as needed.   tacrolimus (PROTOPIC) 0.1 % ointment APPLY TO DRY SKIN ON FACE AND BODY 1-2 TIMES DAILY AS NEEDED.   triamcinolone ointment (KENALOG) 0.1 % Apply to affected itching areas on body twice daily for 2 wks, then daily for 2 wks, then every other day for 2 wks. Not to face.   triamcinolone ointment (KENALOG) 0.1 % APPLY TO AFFECTED ITCHING AREAS ON BODY TWICE DAILY FOR 2 WKS, THEN DAILY FOR 2 WKS, THEN EVERY OTHER DAY FOR 2 WKS. NOT TO FACE.     Flowsheet Row Pulmonary Rehab Walk Test from 06/19/2019 in [place]  PHQ-9 Total Score 3       Objective:   Today's Vitals: BP 132/80 (BP Location: Left Arm, Patient Position: Sitting, Cuff Size: Normal)   Pulse 75   Temp 97.8 F (36.6 C) (Temporal)   Resp 18   Ht 5\' 5"  (1.651 m)   Wt 138 lb (62.6 kg)    SpO2 99%   BMI 22.96 kg/m  Vitals with BMI 11/18/2020 07/15/2020 07/08/2020  Height 5\' 5"  5\' 5"  -  Weight 138 lbs 133 lbs 133 lbs  BMI 56.25 63.89 -  Systolic 373 428 768  Diastolic 80 90 80  Pulse 75 67 85     Physical Exam She looks systemically well.  Blood pressure is in a good range.  Warm hands.      Assessment   1. Hypothyroidism, adult   2. Essential hypertension   3. Hot flashes due to menopause   4. Night sweats   5. Vitamin D deficiency disease  Tests ordered Orders Placed This Encounter  Procedures   T3, free   TSH   Estradiol   Progesterone   Follicle stimulating hormone   COMPLETE METABOLIC PANEL WITH GFR   VITAMIN D 25 Hydroxy (Vit-D Deficiency, Fractures)      Plan: 1.  Continue with NP thyroid 120 mg daily. 2.  I will check vitamin D levels again and renal function as well as calcium levels. 3.  I am sure she is in the menopause but we will do blood work for the hormone levels today. 4.  I will see her in the next several weeks to discuss all the results including hormones.   No orders of the defined types were placed in this encounter.   [name], MD

## 2020-11-22 ENCOUNTER — Other Ambulatory Visit (HOSPITAL_COMMUNITY): Payer: Self-pay | Admitting: *Deleted

## 2020-11-22 ENCOUNTER — Other Ambulatory Visit (INDEPENDENT_AMBULATORY_CARE_PROVIDER_SITE_OTHER): Payer: Self-pay | Admitting: Internal Medicine

## 2020-11-22 ENCOUNTER — Encounter (INDEPENDENT_AMBULATORY_CARE_PROVIDER_SITE_OTHER): Payer: Self-pay | Admitting: Internal Medicine

## 2020-11-22 ENCOUNTER — Other Ambulatory Visit (HOSPITAL_COMMUNITY): Payer: Self-pay

## 2020-11-22 DIAGNOSIS — R5381 Other malaise: Secondary | ICD-10-CM | POA: Diagnosis not present

## 2020-11-22 DIAGNOSIS — R5383 Other fatigue: Secondary | ICD-10-CM | POA: Diagnosis not present

## 2020-11-22 DIAGNOSIS — E559 Vitamin D deficiency, unspecified: Secondary | ICD-10-CM | POA: Diagnosis not present

## 2020-11-22 DIAGNOSIS — N951 Menopausal and female climacteric states: Secondary | ICD-10-CM

## 2020-11-22 DIAGNOSIS — E039 Hypothyroidism, unspecified: Secondary | ICD-10-CM | POA: Diagnosis not present

## 2020-11-22 MED ORDER — FUROSEMIDE 40 MG PO TABS
40.0000 mg | ORAL_TABLET | ORAL | 6 refills | Status: DC | PRN
  Filled 2020-11-22: qty 20, 28d supply, fill #0
  Filled 2020-11-23: qty 90, 90d supply, fill #0

## 2020-11-22 MED FILL — Spironolactone Tab 25 MG: ORAL | 90 days supply | Qty: 90 | Fill #0 | Status: AC

## 2020-11-23 ENCOUNTER — Other Ambulatory Visit (HOSPITAL_COMMUNITY): Payer: Self-pay

## 2020-11-23 LAB — COMPREHENSIVE METABOLIC PANEL
ALT: 9 IU/L (ref 0–32)
AST: 18 IU/L (ref 0–40)
Albumin/Globulin Ratio: 1.5 (ref 1.2–2.2)
Albumin: 4.7 g/dL (ref 3.8–4.9)
Alkaline Phosphatase: 110 IU/L (ref 44–121)
BUN/Creatinine Ratio: 14 (ref 9–23)
BUN: 11 mg/dL (ref 6–24)
Bilirubin Total: 0.4 mg/dL (ref 0.0–1.2)
CO2: 25 mmol/L (ref 20–29)
Calcium: 10.7 mg/dL — ABNORMAL HIGH (ref 8.7–10.2)
Chloride: 101 mmol/L (ref 96–106)
Creatinine, Ser: 0.78 mg/dL (ref 0.57–1.00)
Globulin, Total: 3.2 g/dL (ref 1.5–4.5)
Glucose: 63 mg/dL — ABNORMAL LOW (ref 65–99)
Potassium: 3.9 mmol/L (ref 3.5–5.2)
Sodium: 140 mmol/L (ref 134–144)
Total Protein: 7.9 g/dL (ref 6.0–8.5)
eGFR: 91 mL/min/{1.73_m2} (ref >59)

## 2020-11-23 LAB — SPECIMEN STATUS REPORT

## 2020-11-23 LAB — ESTRADIOL: Estradiol: 49.9 pg/mL

## 2020-11-23 LAB — VITAMIN D 25 HYDROXY (VIT D DEFICIENCY, FRACTURES): Vit D, 25-Hydroxy: 47.2 ng/mL (ref 30.0–100.0)

## 2020-11-23 LAB — PROGESTERONE: Progesterone: 0.2 ng/mL

## 2020-11-23 LAB — TSH: TSH: 3.69 u[IU]/mL (ref 0.450–4.500)

## 2020-11-23 LAB — T3, FREE: T3, Free: 2.6 pg/mL (ref 2.0–4.4)

## 2020-11-23 LAB — FOLLICLE STIMULATING HORMONE: FSH: 41.9 m[IU]/mL

## 2020-11-29 ENCOUNTER — Ambulatory Visit (INDEPENDENT_AMBULATORY_CARE_PROVIDER_SITE_OTHER): Payer: 59 | Admitting: Internal Medicine

## 2020-12-02 ENCOUNTER — Telehealth: Payer: Self-pay

## 2020-12-02 DIAGNOSIS — D250 Submucous leiomyoma of uterus: Secondary | ICD-10-CM | POA: Diagnosis not present

## 2020-12-02 DIAGNOSIS — D259 Leiomyoma of uterus, unspecified: Secondary | ICD-10-CM | POA: Diagnosis not present

## 2020-12-02 NOTE — Telephone Encounter (Signed)
Spoke with patient and appointments scheduled.

## 2020-12-03 ENCOUNTER — Other Ambulatory Visit (HOSPITAL_COMMUNITY): Payer: Self-pay

## 2020-12-03 MED ORDER — LUPRON DEPOT (3-MONTH) 11.25 MG IM KIT
PACK | INTRAMUSCULAR | 1 refills | Status: DC
  Filled 2020-12-03 – 2020-12-07 (×2): qty 1, 90d supply, fill #0

## 2020-12-07 ENCOUNTER — Other Ambulatory Visit (HOSPITAL_COMMUNITY): Payer: Self-pay

## 2020-12-13 ENCOUNTER — Telehealth (INDEPENDENT_AMBULATORY_CARE_PROVIDER_SITE_OTHER): Payer: Self-pay | Admitting: Nurse Practitioner

## 2020-12-13 NOTE — Telephone Encounter (Signed)
Please see if you can schedule this patient for an acute visit this Thursday. She contacted me with questions regarding her thyroid.

## 2020-12-14 NOTE — Telephone Encounter (Signed)
Patient called and I scheduled her for this Thursday at 9:40am for an Acute visit with Judson Roch. Patient verbalized an understanding.

## 2020-12-16 ENCOUNTER — Encounter (INDEPENDENT_AMBULATORY_CARE_PROVIDER_SITE_OTHER): Payer: Self-pay | Admitting: Nurse Practitioner

## 2020-12-16 ENCOUNTER — Ambulatory Visit (INDEPENDENT_AMBULATORY_CARE_PROVIDER_SITE_OTHER): Payer: 59 | Admitting: Nurse Practitioner

## 2020-12-16 ENCOUNTER — Other Ambulatory Visit: Payer: Self-pay

## 2020-12-16 ENCOUNTER — Other Ambulatory Visit (HOSPITAL_COMMUNITY): Payer: Self-pay

## 2020-12-16 DIAGNOSIS — E039 Hypothyroidism, unspecified: Secondary | ICD-10-CM | POA: Diagnosis not present

## 2020-12-16 MED ORDER — NP THYROID 120 MG PO TABS
120.0000 mg | ORAL_TABLET | Freq: Every day | ORAL | 0 refills | Status: DC
  Filled 2020-12-16 – 2021-03-04 (×2): qty 90, 90d supply, fill #0

## 2020-12-16 NOTE — Progress Notes (Signed)
Subjective:  Patient ID: Karen [name], female    DOB: 1971-03-16  Age: 50 y.o. MRN: [id number]  CC:  Chief Complaint  Patient presents with   Follow-up    Discuss medication changes, discuss lab results      HPI  This patient arrives today for the above.  She wanted to discuss her lab results from her last visit approximately one month ago. She also has hypothyroidism and is requesting a refill on her thyroid medication. Last TSH and free T3 were in normal levels. Last CMP showed elevated ca so she stopped her vitamin D3 supplement. She tells me she thinks that she was already off of her vitamin D3 supplement at the time her labs were drawn last month. She is wondering which offices she can contact to discuss bioidentical hormone replacement therapy and to establish with a new PCP as this office is closing permanently at the end of the month. She is very saddened to hear the news of Dr. Lanice Shirts passing. Otherwise she has no acute complaints today.   Past Medical History:  Diagnosis Date   CHF (congestive heart failure) ([facility])    GERD (gastroesophageal reflux disease)    Hypertension    Hypothyroidism    ILD (interstitial lung disease) ([place]) 02/08/2018   Interstitial lung disease ([place])    Multinodular thyroid    benign FNA 08/2017.   Perimenopause 02/27/2019   Scleroderma ([place])    Vitamin D deficiency disease 02/27/2019      Family History  Problem Relation Age of Onset   Hypertension Father    Hypertension Sister    Hypertension Brother    Diabetes Maternal Grandfather    Diabetes Maternal Uncle    Diabetes Mother    Colon cancer Neg Hx     Social History   Social History Narrative   Patient is right-handed. She lives alone in one level home, a few steps to enter.[facility].   Social History   Tobacco Use   Smoking status: Never   Smokeless tobacco: Never  Substance Use Topics   Alcohol use: No     Current Meds  Medication Sig    acetaminophen (TYLENOL) 325 MG tablet Take 2 tablets (650 mg total) by mouth every 6 (six) hours as needed for mild pain, fever or headache (or Fever >/= 101).   azelastine (OPTIVAR) 0.05 % ophthalmic solution Place 1 drop into both eyes daily as needed (allergies).    bisoprolol (ZEBETA) 5 MG tablet TAKE 1 TABLET (5 MG TOTAL) BY MOUTH DAILY.   clindamycin (CLINDAGEL) 1 % gel clindamycin 1 % topical gel  APPLY A THIN LAYER TO THE AFFECTED AREA(S) BY TOPICAL ROUTE 2 TIMES PER DAY   clindamycin-benzoyl peroxide (BENZACLIN) gel APPLY TO FACE DAILY   clobetasol ointment (TEMOVATE) 0.05 % APPLY TO SCALP THREE TIMES WEEKLY.   esomeprazole (NEXIUM) 40 MG capsule TAKE 1 CAPSULE BY MOUTH TWO TIMES DAILY   furosemide (LASIX) 40 MG tablet Take 1 tablet (40 mg total) by mouth daily as needed for edema. Except Saturdays and Sundays   levocetirizine (XYZAL) 5 MG tablet levocetirizine 5 mg tablet  TAKE 1 TABLET BY MOUTH DAILY   macitentan (OPSUMIT) 10 MG tablet Take 1 tablet (10 mg total) by mouth daily.   Multiple Vitamin (MULTIVITAMIN ADULT PO) Take by mouth.   mycophenolate (CELLCEPT) 500 MG tablet TAKE 3 TABLETS BY MOUTH IN THE MORNING AND 3 TABLETS IN THE EVENING   Nintedanib (OFEV) 150 MG CAPS  Take 1 capsule (150 mg total) by mouth 2 (two) times daily.   potassium chloride 20 MEQ/15ML (10%) SOLN TAKE 15 MLS (20 MEQ TOTAL) BY MOUTH DAILY.   spironolactone (ALDACTONE) 25 MG tablet TAKE 1 TABLET (25 MG TOTAL) BY MOUTH DAILY.   tacrolimus (PROTOPIC) 0.1 % ointment APPLY TO DRY SKIN ON FACE AND BODY 1-2 TIMES DAILY AS NEEDED.   triamcinolone ointment (KENALOG) 0.1 % APPLY TO AFFECTED ITCHING AREAS ON BODY TWICE DAILY FOR 2 WKS, THEN DAILY FOR 2 WKS, THEN EVERY OTHER DAY FOR 2 WKS. NOT TO FACE.   [DISCONTINUED] NP THYROID 120 MG tablet TAKE 1 TABLET (120 MG TOTAL) BY MOUTH DAILY BEFORE BREAKFAST.    ROS:  See HPI   Objective:   Today's Vitals: BP 134/84   Pulse 96   Temp (!) 97.3 F (36.3 C)  (Temporal)   Ht $R'5\' 5"'Fz$  (1.651 m)   Wt 136 lb (61.7 kg)   SpO2 99%   BMI 22.63 kg/m  Vitals with BMI 12/16/2020 11/18/2020 07/15/2020  Height $Remov'5\' 5"'QysGnq$  $Remove'5\' 5"'AFQvuiX$  $RemoveB'5\' 5"'XkFSfVsh$   Weight 136 lbs 138 lbs 133 lbs  BMI 22.63 92.42 68.34  Systolic 196 222 979  Diastolic 84 80 90  Pulse 96 75 67     Physical Exam Vitals reviewed.  Constitutional:      General: She is not in acute distress.    Appearance: Normal appearance.  HENT:     Head: Normocephalic and atraumatic.  Neck:     Vascular: No carotid bruit.  Cardiovascular:     Rate and Rhythm: Normal rate and regular rhythm.     Pulses: Normal pulses.     Heart sounds: Normal heart sounds.  Pulmonary:     Effort: Pulmonary effort is normal.     Breath sounds: Normal breath sounds.  Skin:    General: Skin is warm and dry.  Neurological:     General: No focal deficit present.     Mental Status: She is alert and oriented to person, place, and time.  Psychiatric:        Mood and Affect: Mood normal.        Behavior: Behavior normal.        Judgment: Judgment normal.         Assessment and Plan   1. Hypercalcemia   2. Hypothyroidism, adult      Plan: We will send orders to check BMP and PTH for further evaluation. Further recommendations will be made based upon her results.  Refill of her NP thyroid sent to her pharmacy today.    She was provided with the names and phone numbers of offices that practice bioidentical hormone replacement therapy as well as other PCPs in the area.    Tests ordered Orders Placed This Encounter  Procedures   PTH, Intact and Calcium   BMP with eGFR([facility])      Meds ordered this encounter  Medications   NP THYROID 120 MG tablet    Sig: Take 1 tablet (120 mg total) by mouth daily before breakfast.    Dispense:  90 tablet    Refill:  0    Order Specific Question:   Supervising Provider    Answer:   Lindell Spar [id number]    Patient not scheduled for follow-up as this office is closing  permanently as of 01/05/21 due to the passing of Dr. Anastasio Champion.  The patient was notified of this and that they will need to find a new primary  care provider.  They express understanding.   Ailene Ards, NP

## 2020-12-21 LAB — SPECIMEN STATUS REPORT

## 2020-12-21 LAB — VITAMIN D 25 HYDROXY (VIT D DEFICIENCY, FRACTURES): Vit D, 25-Hydroxy: 38.9 ng/mL (ref 30.0–100.0)

## 2020-12-22 ENCOUNTER — Other Ambulatory Visit (INDEPENDENT_AMBULATORY_CARE_PROVIDER_SITE_OTHER): Payer: Self-pay | Admitting: Nurse Practitioner

## 2020-12-22 NOTE — Progress Notes (Signed)
I reordered the PTH and calcium and ordered it through [facility]. Do you know if she needs to go back to have blood redrawn or if [facility] can just add the order on to any blood samples they have?

## 2020-12-23 ENCOUNTER — Other Ambulatory Visit (INDEPENDENT_AMBULATORY_CARE_PROVIDER_SITE_OTHER): Payer: Self-pay | Admitting: Nurse Practitioner

## 2020-12-23 DIAGNOSIS — K219 Gastro-esophageal reflux disease without esophagitis: Secondary | ICD-10-CM | POA: Diagnosis not present

## 2020-12-23 DIAGNOSIS — M359 Systemic involvement of connective tissue, unspecified: Secondary | ICD-10-CM | POA: Diagnosis not present

## 2020-12-23 DIAGNOSIS — K3184 Gastroparesis: Secondary | ICD-10-CM | POA: Diagnosis not present

## 2020-12-23 LAB — PTH, INTACT AND CALCIUM: Calcium: 10.6 mg/dL — ABNORMAL HIGH (ref 8.7–10.2)

## 2020-12-23 LAB — SPECIMEN STATUS REPORT

## 2020-12-23 NOTE — Progress Notes (Signed)
I called [facility] in [place] on [address] [facility] and the lady I spoke with stated that this has been added on and when sent through the electronic system an order is automatically added and for [place] to keep an eye on the order and if not resulted by Monday then it will show if able to use sample or if has been canceled out. Do you want to set a reminder to check on Monday?

## 2020-12-23 NOTE — Addendum Note (Signed)
Addended by: Ailene Ards on: 12/23/2020 04:32 PM   Modules accepted: Orders

## 2020-12-24 ENCOUNTER — Other Ambulatory Visit (HOSPITAL_COMMUNITY): Payer: Self-pay

## 2020-12-29 ENCOUNTER — Other Ambulatory Visit (INDEPENDENT_AMBULATORY_CARE_PROVIDER_SITE_OTHER): Payer: Self-pay | Admitting: Nurse Practitioner

## 2020-12-29 DIAGNOSIS — E213 Hyperparathyroidism, unspecified: Secondary | ICD-10-CM

## 2020-12-29 LAB — PTH, INTACT AND CALCIUM
Calcium: 10.6 mg/dL — ABNORMAL HIGH (ref 8.7–10.2)
PTH: 50 pg/mL (ref 15–65)

## 2020-12-30 ENCOUNTER — Ambulatory Visit (INDEPENDENT_AMBULATORY_CARE_PROVIDER_SITE_OTHER): Payer: 59 | Admitting: Internal Medicine

## 2020-12-31 ENCOUNTER — Ambulatory Visit: Payer: 59 | Admitting: Nurse Practitioner

## 2021-01-06 ENCOUNTER — Other Ambulatory Visit: Payer: Self-pay

## 2021-01-06 ENCOUNTER — Ambulatory Visit: Payer: 59

## 2021-01-06 DIAGNOSIS — N951 Menopausal and female climacteric states: Secondary | ICD-10-CM | POA: Diagnosis not present

## 2021-01-06 DIAGNOSIS — J9601 Acute respiratory failure with hypoxia: Secondary | ICD-10-CM

## 2021-01-06 NOTE — Progress Notes (Signed)
Six Minute Walk - 01/06/21 1604       Six Minute Walk   Medications taken before test (dose and time) none    Supplemental oxygen during test? No    Lap distance in meters  34 meters    Laps Completed 7    Baseline BP (sitting) 142/82    Baseline Heartrate 82    Baseline Dyspnea (Borg Scale) 2    Baseline Fatigue (Borg Scale) 3    Baseline SPO2 97 %      End of Test Values    BP (sitting) 162/86    Heartrate 82    Dyspnea (Borg Scale) 4    Fatigue (Borg Scale) 5    SPO2 94 %      2 Minutes Post Walk Values   BP (sitting) 148/84    Heartrate 82    SPO2 100 %    Stopped or paused before six minutes? No      Interpretation   Tech Comments: Pt completed laps at mod pace. Pt c/o increased SOB at end of walk.

## 2021-01-12 ENCOUNTER — Other Ambulatory Visit: Payer: Self-pay | Admitting: Pulmonary Disease

## 2021-01-12 DIAGNOSIS — J849 Interstitial pulmonary disease, unspecified: Secondary | ICD-10-CM

## 2021-01-13 ENCOUNTER — Ambulatory Visit: Payer: 59 | Admitting: Pulmonary Disease

## 2021-01-13 ENCOUNTER — Other Ambulatory Visit: Payer: Self-pay

## 2021-01-13 ENCOUNTER — Encounter: Payer: Self-pay | Admitting: Pulmonary Disease

## 2021-01-13 ENCOUNTER — Ambulatory Visit (INDEPENDENT_AMBULATORY_CARE_PROVIDER_SITE_OTHER): Payer: 59 | Admitting: Pulmonary Disease

## 2021-01-13 VITALS — BP 134/66 | HR 74 | Temp 98.3°F | Ht 65.0 in | Wt 135.8 lb

## 2021-01-13 DIAGNOSIS — J849 Interstitial pulmonary disease, unspecified: Secondary | ICD-10-CM

## 2021-01-13 DIAGNOSIS — M349 Systemic sclerosis, unspecified: Secondary | ICD-10-CM

## 2021-01-13 LAB — PULMONARY FUNCTION TEST
DL/VA % pred: 97 %
DL/VA: 4.15 ml/min/mmHg/L
DLCO cor % pred: 43 %
DLCO cor: 9.47 ml/min/mmHg
DLCO unc % pred: 43 %
DLCO unc: 9.47 ml/min/mmHg
FEF 25-75 Post: 2.81 L/sec
FEF 25-75 Pre: 2.51 L/sec
FEF2575-%Change-Post: 11 %
FEF2575-%Pred-Post: 114 %
FEF2575-%Pred-Pre: 102 %
FEV1-%Change-Post: 0 %
FEV1-%Pred-Post: 56 %
FEV1-%Pred-Pre: 56 %
FEV1-Post: 1.35 L
FEV1-Pre: 1.34 L
FEV1FVC-%Change-Post: 1 %
FEV1FVC-%Pred-Pre: 113 %
FEV6-%Change-Post: 0 %
FEV6-%Pred-Post: 50 %
FEV6-%Pred-Pre: 50 %
FEV6-Post: 1.45 L
FEV6-Pre: 1.45 L
FEV6FVC-%Pred-Post: 102 %
FEV6FVC-%Pred-Pre: 102 %
FVC-%Change-Post: 0 %
FVC-%Pred-Post: 48 %
FVC-%Pred-Pre: 49 %
FVC-Post: 1.45 L
FVC-Pre: 1.46 L
Post FEV1/FVC ratio: 93 %
Post FEV6/FVC ratio: 100 %
Pre FEV1/FVC ratio: 92 %
Pre FEV6/FVC Ratio: 100 %
RV % pred: 54 %
RV: 1.02 L
TLC % pred: 47 %
TLC: 2.48 L

## 2021-01-13 NOTE — Patient Instructions (Signed)
I have reviewed your lung function test and walk test which is stable Continue current medication I will check with pharmacy to see why it medications are costing more  Follow-up in 6 months

## 2021-01-13 NOTE — Progress Notes (Signed)
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 Haroldine Laws on 09/27/17 with no evidence of pulmonary hypertension Started on CellCept May 2019 and uptitrated to max dose of 1.5 mg twice daily.  She had a hospitalization in [facility] in March 2019 for hypertension.  A CT chest scan at that time showed subtle lower lung findings of atelectasis, groundglass.  She has a right thyroid nodule which was biopsied on 08/15/17 with pathology showing benign findings.  Thyroid function tests are normal.  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 Finished pulmonary rehab in 2021 and is back at work full-time Repeat right heart cath 2021 by Dr. Haroldine Laws showed mild pulmonary hypertension she has been started on  macitetan  Reviewed note from Dr. Trudie Reed, rheumatology 07/12/2017 Seen for joint pain, Raynaud's, elevated CK, skin thickening Serologies positive for Sjogren's negative,, normal CK and aldolase.  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  No mold. No hot tub, Jacuzzi. She has down comforter.  Smoking history: Never smoker Travel history: Grew up in [place].  Lived in [place] and [place] Relevant family history: No family history of lung  disease.    Interim history: Continues on CellCept 1.5 mg twice daily and Ofev On macitentan for pulmonary hypertension per Dr. Haroldine Laws States that breathing is stable with no issues  Has annual follow-up at [facility] transplant program  Outpatient Encounter Medications as of 01/13/2021  Medication Sig   acetaminophen (TYLENOL) 325 MG tablet Take 2 tablets (650 mg total) by mouth every 6 (six) hours as needed for mild pain, fever or headache (or Fever >/= 101).   azelastine (OPTIVAR) 0.05 % ophthalmic solution Place 1 drop into both eyes daily as needed (allergies).    bisoprolol (ZEBETA) 5 MG tablet TAKE 1 TABLET (5 MG TOTAL) BY MOUTH DAILY.   clindamycin (CLINDAGEL) 1 % gel clindamycin 1 % topical gel  APPLY A THIN LAYER TO THE AFFECTED AREA(S) BY TOPICAL ROUTE 2 TIMES PER DAY   clobetasol ointment (TEMOVATE) 0.05 % APPLY TO SCALP THREE TIMES WEEKLY.   esomeprazole (NEXIUM) 40 MG capsule TAKE 1 CAPSULE BY MOUTH TWO TIMES DAILY   furosemide (LASIX) 40 MG tablet Take 1 tablet (40 mg total) by mouth daily as needed for edema. Except Saturdays and Sundays   levocetirizine (XYZAL) 5 MG tablet levocetirizine 5 mg tablet  TAKE 1 TABLET BY MOUTH DAILY   macitentan (OPSUMIT) 10 MG tablet Take 1 tablet (10 mg total) by mouth daily.   Multiple Vitamin (MULTIVITAMIN ADULT PO) Take by mouth.   NP THYROID 120 MG tablet Take 1 tablet (120 mg total) by mouth daily before breakfast.   potassium chloride 20 MEQ/15ML (10%) SOLN TAKE 15 MLS (20 MEQ TOTAL) BY MOUTH DAILY.   spironolactone (ALDACTONE) 25 MG tablet TAKE 1 TABLET (25  MG TOTAL) BY MOUTH DAILY.   tacrolimus (PROTOPIC) 0.1 % ointment APPLY TO DRY SKIN ON FACE AND BODY 1-2 TIMES DAILY AS NEEDED.   triamcinolone ointment (KENALOG) 0.1 % APPLY TO AFFECTED ITCHING AREAS ON BODY TWICE DAILY FOR 2 WKS, THEN DAILY FOR 2 WKS, THEN EVERY OTHER DAY FOR 2 WKS. NOT TO FACE. (Patient taking differently: Apply 1 application topically daily.)   clindamycin-benzoyl  peroxide (BENZACLIN) gel APPLY TO FACE DAILY (Patient not taking: Reported on 01/13/2021)   mycophenolate (CELLCEPT) 500 MG tablet TAKE 3 TABLETS BY MOUTH IN THE MORNING AND 3 TABLETS IN THE EVENING (Patient not taking: Reported on 01/13/2021)   Nintedanib (OFEV) 150 MG CAPS Take 1 capsule (150 mg total) by mouth 2 (two) times daily. (Patient not taking: Reported on 01/13/2021)   [DISCONTINUED] PARoxetine (PAXIL) 10 MG tablet Take 1 tablet (10 mg total) by mouth daily as directed   No facility-administered encounter medications on file as of 01/13/2021.    Physical Exam: Blood pressure 134/66, pulse 74, temperature 98.3 F (36.8 C), temperature source Oral, height '5\' 5"'$  (1.651 m), weight 135 lb 12.8 oz (61.6 kg), SpO2 99 %. Gen:      No acute distress HEENT:  EOMI, sclera anicteric Neck:     No masses; no thyromegaly Lungs:    Clear to auscultation bilaterally; normal respiratory effort CV:         Regular rate and rhythm; no murmurs Abd:      + bowel sounds; soft, non-tender; no palpable masses, no distension Ext:    No edema; adequate peripheral perfusion Skin:      Warm and dry; no rash Neuro: alert and oriented x 3 Psych: normal mood and affect   Data Reviewed: Imaging CT chest, [facility] 07/24/2017 Dependent atelectasis, subtle groundglass opacities at the base.  2.5 cm right thyroid nodule.  I have reviewed the images personally  CT high-resolution 09/18/2017- patchy reticular groundglass opacities, reticulation with subpleural sparing.  Mild traction bronchiectasis.  No honeycombing.  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  Aortic atherosclerosis, three-vessel coronary artery disease.  CT high-resolution 06/27/2018-stable interstitial lung disease.  CT high-resolution 06/11/2019-stable interstitial lung disease  CT high-resolution 05/19/2020-stable interstitial lung disease I have reviewed the images  personally.  PFTs FENO 09/06/2017-unable to complete  07/27/2017 FVC 1.65 (54%), FEV1 1.53 (62%], F/F 93, TLC 50, DLCO 44%, DLCO/VA 131%  10/25/2017 FVC 1.51 [50%], FEV1 1.26 [51%], F/F 83  01/29/2018 FVC 1.69 [5%), FEV1 1.50 [61%], F/F 89, DLCO 10.57 [41%]  07/01/2018 FVC 1.57 [52%), FEV1 1.47 [60%], F/F 93, DLCO 12.54 [59%)  01/06/2019 FVC 1.58 [52%], FEV1 1.46 [60%], F/F 92, TLC 2.62 [50%], DLCO 9.16 [42%] Severe restriction and diffusion impairment.  FVC is stable but DLCO has worsened.  6-minute walk  10/23/2017- 144 m Post walk heart rate, stats 94, 91%  02/04/2018- 249 m Post walk heart rate, sats 101, 99%  06/06/2018-212 m Post walk heart rate, sats 86, 92%  01/30/2019-136mPost walk heart rate, sats 83, 98%  01/13/21-238 m  Labs 08/20/2017-ANA, centromere, rheumatoid factor, SCL 70-negative QuantiFERON 09/12/2017-negative Hepatitis B, C screening 09/21/2017-negative G6PD 09/25/2017-13  Labs from Dr. HTrudie Reed3/7/19 Rheumatoid factor < 10, CCP-8, ANCA-negative, double-stranded DNA-negative, Smith antibody-negative, SCL 70-negative, RNP-negative SSA 5.8, SSB 1.5 Smith antibody-negative, ANA IFA-negative Myositis panel-negative CK 165, C4-30, C3-168, total complement greater than 60, aldolase 8.8 Sed rate 9, CRP 0.9  CBC, hepatic panel 09/16/2020-within normal limits  Cardiac Cardiac cath 09/27/17 PA = 32/10 (19) PCW = 6 Fick cardiac output/index = 5.0/3.0 PVR = 2.6  Cardiac cath 09/05/2019 PA = 38/16 (25) PCW = 6 Fick cardiac output/index = 5.5/3.4 PVR = 3.4 WU Mild pulmonary hypertension  Overnight oximetry 01/30/2019- test time 8 hours 25 minutes Lowest O2 sat 87%.  Mean O2 sat 95%.  Time spent less than 88% - 1 minute 30 seconds No significant desaturation.  Does not need supplemental oxygen.  Assessment:  Scleroderma ILD Continue on CellCept 1.5 g twice daily, Bactrim prophylaxis and ofev PFTs reviewed which was stable.  6-minute walk shows an increase in  distance.  Symptomatically she is doing well  Completed pulmonary rehab Follow-up at [facility] transplant.  She is still early stage with regard to transplant but it is good that she has established with them now Recent labs reviewed with normal liver function and blood counts  Reports that her CellCept is costing her 100s of dollar more.  Will check with pharmacy to see if she can get on any assistance  Hypercalcemia She has mild hypercalcemia of unclear etiology.  She had PTH and vitamin D levels checked by her primary care which are within normal limits She has stopped taking vitamin D supplement.  Continue monitoring.  Mild pulmonary hypertension On macitentan.  Follows with Dr. Haroldine Laws  Esophageal dysfunction, esophageal stricture, GERD Continue on Protonix 20 mg twice daily Follows with [facility] gastroenterology  Plan/Recommendations: - Continue CellCept, Ofev - Bactrim prophylaxis.  Marshell Garfinkel MD [facility] 01/13/2021, 11:41 AM

## 2021-01-13 NOTE — Progress Notes (Signed)
PFT done today. 

## 2021-01-14 ENCOUNTER — Other Ambulatory Visit (HOSPITAL_COMMUNITY): Payer: Self-pay

## 2021-01-14 ENCOUNTER — Telehealth: Payer: Self-pay | Admitting: Pharmacist

## 2021-01-14 DIAGNOSIS — M349 Systemic sclerosis, unspecified: Secondary | ICD-10-CM

## 2021-01-14 DIAGNOSIS — J849 Interstitial pulmonary disease, unspecified: Secondary | ICD-10-CM

## 2021-01-14 DIAGNOSIS — S63642D Sprain of metacarpophalangeal joint of left thumb, subsequent encounter: Secondary | ICD-10-CM | POA: Diagnosis not present

## 2021-01-14 MED ORDER — MELOXICAM 15 MG PO TABS
15.0000 mg | ORAL_TABLET | Freq: Every day | ORAL | 3 refills | Status: DC
  Filled 2021-01-14: qty 30, 30d supply, fill #0

## 2021-01-14 MED ORDER — MYCOPHENOLATE MOFETIL 500 MG PO TABS
ORAL_TABLET | ORAL | 1 refills | Status: DC
  Filled 2021-01-14: qty 540, 90d supply, fill #0
  Filled 2021-03-25: qty 180, 30d supply, fill #0

## 2021-01-14 NOTE — Progress Notes (Signed)
Cellcept cost resolution documented in separate telephone encounter.

## 2021-01-14 NOTE — Telephone Encounter (Addendum)
Ran test claim for mycophenolate 1.5 g twice daily Copay for 30 day supply is $16.28 Copay for 90 day supply is $48.24  Called patient to advise but unable to reach. Left detailed VM and also sent MyChart message. New prescription sent to [facility]  ----- Message from Marshell Garfinkel, MD sent at 01/13/2021 12:11 PM EDT ----- Can you help with her medication She is on CellCept and states that suddenly her medications are costing several $100 more.  Set a change in her insurance coverage or can she get some assistance.  Thanks

## 2021-01-17 ENCOUNTER — Other Ambulatory Visit (HOSPITAL_COMMUNITY): Payer: Self-pay

## 2021-01-18 ENCOUNTER — Other Ambulatory Visit (HOSPITAL_COMMUNITY): Payer: Self-pay

## 2021-01-20 ENCOUNTER — Telehealth: Payer: Self-pay | Admitting: Pulmonary Disease

## 2021-01-20 DIAGNOSIS — N951 Menopausal and female climacteric states: Secondary | ICD-10-CM | POA: Diagnosis not present

## 2021-01-20 DIAGNOSIS — G479 Sleep disorder, unspecified: Secondary | ICD-10-CM | POA: Diagnosis not present

## 2021-01-20 DIAGNOSIS — J849 Interstitial pulmonary disease, unspecified: Secondary | ICD-10-CM

## 2021-01-20 DIAGNOSIS — I10 Essential (primary) hypertension: Secondary | ICD-10-CM | POA: Diagnosis not present

## 2021-01-20 DIAGNOSIS — E039 Hypothyroidism, unspecified: Secondary | ICD-10-CM | POA: Diagnosis not present

## 2021-01-20 DIAGNOSIS — R232 Flushing: Secondary | ICD-10-CM | POA: Diagnosis not present

## 2021-01-20 DIAGNOSIS — M349 Systemic sclerosis, unspecified: Secondary | ICD-10-CM | POA: Diagnosis not present

## 2021-01-20 DIAGNOSIS — Z6823 Body mass index (BMI) 23.0-23.9, adult: Secondary | ICD-10-CM | POA: Diagnosis not present

## 2021-01-20 MED ORDER — OFEV 150 MG PO CAPS
150.0000 mg | ORAL_CAPSULE | Freq: Two times a day (BID) | ORAL | 4 refills | Status: DC
Start: 2021-01-20 — End: 2022-08-24

## 2021-01-20 NOTE — Telephone Encounter (Signed)
Call returned to pharmacy, confirmed patient DOB. Confirmed medication. Refill sent.   Nothing further needed at this time.

## 2021-01-26 ENCOUNTER — Other Ambulatory Visit (HOSPITAL_COMMUNITY): Payer: Self-pay

## 2021-01-31 ENCOUNTER — Telehealth: Payer: Self-pay | Admitting: Pharmacist

## 2021-01-31 NOTE — Telephone Encounter (Signed)
Received a fax regarding Prior Authorization from [facility] for [place]. Authorization has been DENIED because patient has to trial Esbriet/pirfenidone.  Called insurance to speak with clinical reviewer however per rep, next step is appeal. Requested fax of appeal forms.  Phone# [phone]  Knox Saliva, PharmD, MPH, BCPS Clinical Pharmacist (Rheumatology and Pulmonology)

## 2021-02-03 NOTE — Telephone Encounter (Signed)
Submitted an URGENT appeal to [facility] for OFEV with chart notes and most recent HRCT. Auth was incorrectly submitted for IPF diagnosis but unable to reinitiate prior auth for SSc-ILD dx on [facility] (Key: [id number])  Reference # [id number] Phone: [phone] Fax: [phone]   Knox Saliva, PharmD, MPH, BCPS Clinical Pharmacist (Rheumatology and Pulmonology)

## 2021-02-04 NOTE — Telephone Encounter (Signed)
Received a fax regarding Prior Authorization from [facility] for Karen [name]. Authorization has been DENIED because patient must have trial or contraindication to the preferred agent- Actemra Subcutaneous injection.  Uploaded denial to patient's chart.  Phone# [phone]

## 2021-02-09 NOTE — Telephone Encounter (Signed)
Submitted an URGENT appeal to [facility] for OFEV with most recent OV notes, SENSCIS trial, and Ssc-ILD Management [place] Consensus Statements  Reference # [id number] Phone: [phone] Fax: [phone]  Knox Saliva, PharmD, MPH, BCPS Clinical Pharmacist (Rheumatology and Pulmonology)

## 2021-02-10 NOTE — Telephone Encounter (Signed)
[facility] department and per rep, Ofev second level appeal documents were received yesterday and urgent appeal review is in process  Fax: [phone] Phone: [phone] Ref: [phone]

## 2021-02-11 DIAGNOSIS — S63642D Sprain of metacarpophalangeal joint of left thumb, subsequent encounter: Secondary | ICD-10-CM | POA: Diagnosis not present

## 2021-02-21 ENCOUNTER — Other Ambulatory Visit (HOSPITAL_COMMUNITY): Payer: Self-pay

## 2021-02-21 NOTE — Telephone Encounter (Addendum)
Called [employer] for update on patient's Ofev appeal. Per rep, Ofev denial was overturned. They state that only [facility] can fax approval letter (contact number below) which I ATC x 3 but went to VM.  Phone: [phone]  Authorization for [place] [employer] has been APPROVED from 02/11/21 to 08/11/21.  Able to run test claim ($200 for 30 day supply but patient should continue to fill through [facility].  ATC patient to review but unable to reach. Left VM advising that she can call [facility] to schedule shipment. Per [place], she has not filled Ofev for several months.  Knox Saliva, PharmD, MPH, BCPS Clinical Pharmacist (Rheumatology and Pulmonology)

## 2021-02-23 NOTE — Telephone Encounter (Signed)
Called patient regarding Ofev approval. Provided with [facility] phone number to schedule shipment. LFTs wnl on 11/22/20  Hepatic Function Latest Ref Rng & Units 11/22/2020 07/15/2020 03/15/2020  Total Protein 6.0 - 8.5 g/dL 7.9 7.3 7.7  Albumin 3.8 - 4.9 g/dL 4.7 - -  AST 0 - 40 IU/L $Remov'18 15 16  'zVMvwH$ ALT 0 - 32 IU/L $Remov'9 8 7  'gCSqmq$ Alk Phosphatase 44 - 121 IU/L 110 - -  Total Bilirubin 0.0 - 1.2 mg/dL 0.4 0.4 0.5  Bilirubin, Direct 0.0 - 0.3 mg/dL - - -   Advised her to reach out to clinic if she has any side effects once restarting since she has been off of therapy for > 1 year  Knox Saliva, PharmD, MPH, BCPS Clinical Pharmacist (Rheumatology and Pulmonology)

## 2021-03-03 DIAGNOSIS — Z6822 Body mass index (BMI) 22.0-22.9, adult: Secondary | ICD-10-CM | POA: Diagnosis not present

## 2021-03-03 DIAGNOSIS — J849 Interstitial pulmonary disease, unspecified: Secondary | ICD-10-CM | POA: Diagnosis not present

## 2021-03-03 DIAGNOSIS — M349 Systemic sclerosis, unspecified: Secondary | ICD-10-CM | POA: Diagnosis not present

## 2021-03-03 DIAGNOSIS — I7300 Raynaud's syndrome without gangrene: Secondary | ICD-10-CM | POA: Diagnosis not present

## 2021-03-03 DIAGNOSIS — I272 Pulmonary hypertension, unspecified: Secondary | ICD-10-CM | POA: Diagnosis not present

## 2021-03-04 ENCOUNTER — Other Ambulatory Visit (HOSPITAL_COMMUNITY): Payer: Self-pay

## 2021-03-04 ENCOUNTER — Other Ambulatory Visit: Payer: Self-pay | Admitting: Internal Medicine

## 2021-03-04 MED FILL — Spironolactone Tab 25 MG: ORAL | 90 days supply | Qty: 90 | Fill #1 | Status: AC

## 2021-03-07 ENCOUNTER — Other Ambulatory Visit (HOSPITAL_COMMUNITY): Payer: Self-pay

## 2021-03-07 MED ORDER — ESOMEPRAZOLE MAGNESIUM 40 MG PO CPDR
40.0000 mg | DELAYED_RELEASE_CAPSULE | Freq: Two times a day (BID) | ORAL | 1 refills | Status: DC
  Filled 2021-03-07 – 2021-03-25 (×2): qty 180, 90d supply, fill #0
  Filled 2021-11-23: qty 180, 90d supply, fill #1

## 2021-03-09 NOTE — Telephone Encounter (Signed)
Called [facility] for update on patient's Ofev. Per rep, patient is still locked into [facility] to fill Ofev. However, med is limited distribution so unable to be filled by [facility] despite his insurance looking him in. I've reached out pharmacy benefits coordinator to place override for patient to have filled at [facility] or alternative pharmacy  Knox Saliva, PharmD, MPH, BCPS Clinical Pharmacist (Rheumatology and Pulmonology)

## 2021-03-15 ENCOUNTER — Other Ambulatory Visit (HOSPITAL_COMMUNITY): Payer: Self-pay

## 2021-03-21 DIAGNOSIS — B9689 Other specified bacterial agents as the cause of diseases classified elsewhere: Secondary | ICD-10-CM | POA: Diagnosis not present

## 2021-03-21 DIAGNOSIS — I10 Essential (primary) hypertension: Secondary | ICD-10-CM | POA: Diagnosis not present

## 2021-03-21 DIAGNOSIS — J019 Acute sinusitis, unspecified: Secondary | ICD-10-CM | POA: Diagnosis not present

## 2021-03-25 ENCOUNTER — Other Ambulatory Visit (HOSPITAL_COMMUNITY): Payer: Self-pay

## 2021-03-25 MED FILL — Bisoprolol Fumarate Tab 5 MG: ORAL | 90 days supply | Qty: 90 | Fill #1 | Status: AC

## 2021-04-04 ENCOUNTER — Ambulatory Visit: Payer: 59 | Admitting: Gastroenterology

## 2021-04-06 NOTE — Progress Notes (Signed)
Referring Provider: [name], MD Primary Care Physician:  Pcp, No Primary GI Physician: Dr. [name]  Chief Complaint  Patient presents with   Constipation   Diarrhea   Abdominal Pain    HPI:   Karen [name] is a 50 y.o. female with chronic history of GERD, gastroparesis with abnormal GES in 2019. Most recent EGD in September 2019 with erosive reflux esophagitis, patulous GE junction, no dilation and limited exam due to retained food in stomach.  Colonoscopy up-to-date 01/02/2019 and found normal colon with recommended repeat in 10 years (2030). She is presenting today for 1 year follow-up. She is also following with [facility].    CT in April 2020 with questionable mild inflammatory changes of the rectosigmoid junction/rectum representing possible diverticulitis or proctitis; also noted cholelithiasis, enlarged fibroid uterus with local mass-effect on sigmoid colon/rectum and urinary bladder, physiologic cystic changes of adnexa, and redemonstration of left-sided nonobstructive nephrolithiasis.  Empirically treated for diverticulitis with Cipro and Flagyl for 7 days.  She did have resolution of her abdominal pain after treatment.   Last seen in our office 01/22/2020.  GERD fairly well managed with Nexium twice daily.  Occasional chronic abdominal pain and fullness.  [facility] to had taken patient off of Reglan and referred her to a dietitian.  She reported she had implemented dietary changes recommended by [facility].  No longer on Reglan.  No alarm symptoms.  No other significant GI symptoms.  Noted worsening cough in the setting of scleroderma.  Recommended continuing Nexium and follow-up with [facility] on treatment of gastroparesis.  She had office visit with [facility] on 12/23/20.  She continued with upper abdominal pain, abdominal fullness, associated nausea and sometimes vomiting, and heartburn, especially at night with occasional regurgitation.  She was on Nexium 40 mg twice daily.  Continued off  Reglan.  She was counseled again on dietary management of gastroparesis with 4-6 small meals daily and encouraged to follow step 1- step 3 gastroparesis diet, adjusting her diet based on her symptoms for the day.  If more severe symptoms, go back to step 1 diet with soups and Ensure, then advance as tolerated.  If symptoms persist, could consider EGD with pyloric Botox.  Also discussed ways to limit gas and bloating.  Today:  Continues with intermittent abdominal fullness and upper abdominal cramping, occasional nausea/vomiting if getting to full.  This occurs maybe 1-2 times a month.  Her weight is stable.  Heartburn is improving as she started taking Nexium before meals rather than after meals.  When she has significant fullness, she notices breakthrough reflux symptoms.  This occurs about once a week, usually in the evening when she lays down.  No particular food triggers.  She is eating lunch and dinner and maybe a snack between.  She has never tried eating 4-6 small meals a day.  For lunch, she usually has a sandwich or a salad.  For dinner, she also has chicken and some sort of vegetables.  Reports she previously had some involuntary movements with Reglan.    Sometimes its hard to have a BM. Intermittent. Had to take a laxative last month. Bms every other day. Sometimes hard, and sometimes soft.  Reports she has chronic history of intermittent constipation.  Fairly rare diarrhea. Thinks occasional diarrhea is related to something she is eating, but cannot pinpoint it. No brbpr or melena.   Past Medical History:  Diagnosis Date   CHF (congestive heart failure) (HCC)    GERD (gastroesophageal  reflux disease)    Hypertension    Hypothyroidism    ILD (interstitial lung disease) ([place]) 02/08/2018   Interstitial lung disease ([place])    Multinodular thyroid    benign FNA 08/2017.   Perimenopause 02/27/2019   Scleroderma ([place])    Vitamin D deficiency disease 02/27/2019    Past Surgical History:   Procedure Laterality Date   ABDOMINAL HERNIA REPAIR     BUNIONECTOMY Bilateral 10/2018   CHOLECYSTECTOMY N/A 02/13/2020   Procedure: LAPAROSCOPIC CHOLECYSTECTOMY;  Surgeon: Aviva Signs, MD;  Location: [facility];  Service: General;  Laterality: N/A;   COLONOSCOPY WITH PROPOFOL N/A 01/02/2019   Procedure: COLONOSCOPY WITH PROPOFOL;  Surgeon: Daneil Dolin, MD;  Location: [facility];  Service: Endoscopy;  Laterality: N/A;  12:30pm   ESOPHAGOGASTRODUODENOSCOPY (EGD) WITH PROPOFOL N/A 01/28/2018   erosive reflux esophagitis, patulous EG Junction, no dilation, incomplete EGD due to retained food in stomach. GES thereafter with delayed gastric emptying.    RIGHT HEART CATH N/A 09/27/2017   Procedure: RIGHT HEART CATH;  Surgeon: Jolaine Artist, MD;  Location: [place] [facility];  Service: Cardiovascular;  Laterality: N/A;   RIGHT HEART CATH N/A 09/05/2019   Procedure: RIGHT HEART CATH;  Surgeon: Jolaine Artist, MD;  Location: [place] [facility];  Service: Cardiovascular;  Laterality: N/A;   UTERINE FIBROID SURGERY      Current Outpatient Medications  Medication Sig Dispense Refill   acetaminophen (TYLENOL) 325 MG tablet Take 2 tablets (650 mg total) by mouth every 6 (six) hours as needed for mild pain, fever or headache (or Fever >/= 101). 12 tablet 0   azelastine (OPTIVAR) 0.05 % ophthalmic solution Place 1 drop into both eyes daily as needed (allergies).      bisoprolol (ZEBETA) 5 MG tablet TAKE 1 TABLET (5 MG TOTAL) BY MOUTH DAILY. 90 tablet 2   clindamycin (CLINDAGEL) 1 % gel as needed.     esomeprazole (NEXIUM) 40 MG capsule Take 1 capsule (40 mg total) by mouth 2 (two) times daily. 180 capsule 1   furosemide (LASIX) 40 MG tablet Take 1 tablet (40 mg total) by mouth daily as needed for edema. Except Saturdays and Sundays 30 tablet 6   levocetirizine (XYZAL) 5 MG tablet levocetirizine 5 mg tablet  TAKE 1 TABLET BY MOUTH DAILY     macitentan (OPSUMIT) 10 MG tablet Take 1 tablet (10 mg  total) by mouth daily. 30 tablet 11   Multiple Vitamin (MULTIVITAMIN ADULT PO) Take by mouth.     mycophenolate (CELLCEPT) 500 MG tablet Take 3 tablets (1,500 mg total) by mouth every morning AND 3 tablets (1,500 mg total) every evening. 540 tablet 1   Nintedanib (OFEV) 150 MG CAPS Take 1 capsule (150 mg total) by mouth 2 (two) times daily. 60 capsule 4   NP THYROID 120 MG tablet Take 1 tablet (120 mg total) by mouth daily before breakfast. 90 tablet 0   Prucalopride Succinate (MOTEGRITY) 1 MG TABS Take 1 mg by mouth daily. 60 tablet 1   spironolactone (ALDACTONE) 25 MG tablet TAKE 1 TABLET (25 MG TOTAL) BY MOUTH DAILY. 90 tablet 2   potassium chloride 20 MEQ/15ML (10%) SOLN TAKE 15 MLS (20 MEQ TOTAL) BY MOUTH DAILY. (Patient taking differently: as needed.) 473 mL 11   No current facility-administered medications for this visit.    Allergies as of 04/07/2021 - Review Complete 04/07/2021  Allergen Reaction Noted   Lisinopril Hives, Swelling, and Other (See Comments) 08/13/2015  Family History  Problem Relation Age of Onset   Hypertension Father    Hypertension Sister    Hypertension Brother    Diabetes Maternal Grandfather    Diabetes Maternal Uncle    Diabetes Mother    Colon cancer Neg Hx     Social History   Socioeconomic History   Marital status: Single    Spouse name: Not on file   Number of children: 0   Years of education: Not on file   Highest education level: Associate degree: occupational, Hotel manager, or vocational program  Occupational History   Occupation: CMA    Employer: [employer]  Tobacco Use   Smoking status: Never   Smokeless tobacco: Never  Vaping Use   Vaping Use: Former  Substance and Sexual Activity   Alcohol use: No   Drug use: No   Sexual activity: Yes  Other Topics Concern   Not on file  Social History Narrative   Patient is right-handed. She lives alone in one level home, a few steps to enter.CMA [employer] [facility] [employer].   Social  Determinants of Health   Financial Resource Strain: Not on file  Food Insecurity: Not on file  Transportation Needs: Not on file  Physical Activity: Not on file  Stress: Not on file  Social Connections: Not on file    Review of Systems: Gen: Denies fever, chills, cold or flulike symptoms, presyncope, syncope. CV: Denies chest pain, palpitations. Resp: Admits to chronic shortness of breath with exertion and chronic cough in the setting of scleroderma. GI: See HPI Heme: See HPI  Physical Exam: BP (!) 190/110 (BP Location: Left Arm)   Pulse 96   Temp (!) 97.5 F (36.4 C) (Temporal)   Ht 5\' 5"  (1.651 m)   Wt 138 lb (62.6 kg)   BMI 22.96 kg/m  General:   Alert and oriented. No distress noted. Pleasant and cooperative.  Head:  Normocephalic and atraumatic. Eyes:  Conjuctiva clear without scleral icterus. Heart:  S1, S2 present without murmurs appreciated. Lungs:  Clear to auscultation bilaterally. No wheezes, rales, or rhonchi. No distress.  Abdomen:  +BS, soft, non-tender and non-distended. No rebound or guarding. No HSM or masses noted. Msk:  Symmetrical without gross deformities. Normal posture. Extremities:  Without edema. Neurologic:  Alert and  oriented x4 Psych: Normal mood and affect.    Assessment: 50 year old female with chronic history of GERD, gastroparesis with abnormal gastric emptying study in 2019, and intermittent constipation, presenting today for follow-up.  Gastroparesis:  Continues with intermittent upper abdominal fullness/bloating/cramping, occasional nausea/vomiting occurring 1-2 times a month if symptoms are more severe.  Encouragingly, her weight has been stable. Previously on Reglan, but reported involuntary muscle movements.  She is also seeing [facility] who has been reinforcing a strict gastroparesis diet, but patient continues eating 2 main meals daily and a snack between.  [facility] has stated they would consider EGD with FLIP and possible pyloric Botox  injection if symptoms persist despite dietary adjustments.  Notably, [facility] also encouraged local GI follow-up.    I counseled her extensively on a gastroparesis diet as well as provided her with 2 separate handouts on this as patient states she is just not sure what to eat.  Additionally, as she reported chronic intermittent constipation, we will also try her on Motegrity to see if this will provide some benefit for her entire GI tract motility.  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  GERD: Fairly well-controlled  on Nexium 40 mg twice daily.  Occasional breakthrough symptoms at night, likely influenced by gastroparesis.  Constipation:  Chronic intermittent constipation without alarm symptoms.  Colonoscopy up-to-date, due for repeat in 2030.  We will try her on Motegrity.  We will start with 1 mg to ensure she does not develop any diarrhea.  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  HTN:  Blood pressure elevated today at 190/110.  She is asymptomatic and states she did not take her blood pressure medications this morning as she forgot she had an appointment and was rushing to get here.  Does not feel she needs to go to the emergency room.  States she will take her blood pressure medications as soon as she gets home. She does have a BP cuff at home as well. Advised if her BP doesn't come down after taking her medications, or if she were to develop a headache, blurry vision, chest pain, she needs to proceed to the emergency room.  She voiced understanding.   Plan: Start Motegrity 1 mg daily.  Prescription sent to pharmacy.  Requested progress report in 3-4 weeks.  She was counseled on possible psychiatric adverse effects including potential for suicidal thoughts.  She was advised to discontinue the medication immediately and let us know if this occurs. Counseled extensively on gastroparesis diet.   Separate written instructions and 2 separate handouts provided. Reinforced the importance of 4-6 small meals daily. Low-fat/low fiber diet. Avoid raw fruits and vegetables. Continue Nexium 40 mg twice daily. Reinforced GERD diet/lifestyle.  Separate written instructions provided. Specifically encouraged patient not to eat within 3 hours of laying down and propping the head of her bed up to create a 6 inch incline as her symptoms are primarily at night. Advised to take blood pressure medications as soon as she got home.  ED precautions discussed. Follow-up in 3 months.   Aliene Altes, PA-C [facility] 04/07/2021

## 2021-04-07 ENCOUNTER — Telehealth: Payer: Self-pay

## 2021-04-07 ENCOUNTER — Other Ambulatory Visit (HOSPITAL_COMMUNITY): Payer: Self-pay

## 2021-04-07 ENCOUNTER — Other Ambulatory Visit: Payer: Self-pay

## 2021-04-07 ENCOUNTER — Ambulatory Visit (INDEPENDENT_AMBULATORY_CARE_PROVIDER_SITE_OTHER): Payer: 59 | Admitting: Gastroenterology

## 2021-04-07 ENCOUNTER — Encounter: Payer: Self-pay | Admitting: Gastroenterology

## 2021-04-07 ENCOUNTER — Ambulatory Visit: Payer: 59 | Admitting: Nurse Practitioner

## 2021-04-07 VITALS — BP 190/110 | HR 96 | Temp 97.5°F | Ht 65.0 in | Wt 138.0 lb

## 2021-04-07 DIAGNOSIS — K3184 Gastroparesis: Secondary | ICD-10-CM

## 2021-04-07 DIAGNOSIS — K219 Gastro-esophageal reflux disease without esophagitis: Secondary | ICD-10-CM

## 2021-04-07 DIAGNOSIS — M1991 Primary osteoarthritis, unspecified site: Secondary | ICD-10-CM | POA: Insufficient documentation

## 2021-04-07 DIAGNOSIS — K5904 Chronic idiopathic constipation: Secondary | ICD-10-CM

## 2021-04-07 DIAGNOSIS — I10 Essential (primary) hypertension: Secondary | ICD-10-CM | POA: Diagnosis not present

## 2021-04-07 HISTORY — DX: Primary osteoarthritis, unspecified site: M19.91

## 2021-04-07 MED ORDER — MOTEGRITY 1 MG PO TABS
1.0000 mg | ORAL_TABLET | Freq: Every day | ORAL | 1 refills | Status: DC
  Filled 2021-04-07: qty 60, 60d supply, fill #0

## 2021-04-07 NOTE — Telephone Encounter (Signed)
PA done for Motegrity 1 mg tab. Tried/failed: Miralax, Dx used K59.04. waiting on a response from cover my meds.

## 2021-04-07 NOTE — Patient Instructions (Signed)
Start Motegrity 1 mg daily.  This works to help with the motility of your GI tract and will help with constipation.  I am hopeful that this may also help with your gastroparesis symptoms.   As we discussed, there is potential risk for suicidal thoughts.  If you have any depressive or suicidal thoughts, please stop the medication immediately and let me know.  Follow a strict gastroparesis diet: Please see separate handout. In general, you should eat 4-6 small meals daily and follow a low-fat/low fiber diet. Avoid raw fruits and vegetables. Avoid eating within 3 hours of going to bed.  For reflux:  Continue Nexium 40 mg twice daily 30 minutes before breakfast and dinner. Follow a GERD diet:  Avoid fried, fatty, greasy, spicy, citrus foods. Avoid caffeine and carbonated beverages. Avoid chocolate. Try eating 4-6 small meals a day rather than 3 large meals. Do not eat within 3 hours of laying down. Prop head of bed up on wood or bricks to create a 6 inch incline.  We will plan to see you back in about 3 months.  Call with a progress report in about 3-4 weeks on how Motegrity is working for you.  If you are tolerating the medication well, we could consider increasing to 2 mg daily.  It was good to see you again today!  I hope you have a very Merry Christmas!  Aliene Altes, PA-C [facility]

## 2021-04-11 ENCOUNTER — Telehealth: Payer: Self-pay

## 2021-04-11 NOTE — Telephone Encounter (Signed)
Pt's Motegrity was denied due to pt has not done the step therapy. Documentation from [place] is saying approval is requiring that the pt try Linzess. Please advised

## 2021-04-11 NOTE — Telephone Encounter (Signed)
I don't think she needs Linzess. We were hoping motegrity would help with mild constipation and her gastroparesis.   Please let patient know Motegrity was denied. If she feels she needs a prescriptive agent for constipation, we can try her on low dose Linzess. Otherwise, she can try using over the counter colace 100-200 mg as needed.

## 2021-04-11 NOTE — Telephone Encounter (Signed)
Phoned and LMOVM for the pt to call me tomorrow regarding her Rx for [place].

## 2021-04-13 NOTE — Telephone Encounter (Signed)
Phoned and LMOVM for the pt to return call regarding her Rx

## 2021-04-14 NOTE — Telephone Encounter (Signed)
Letter mailed to the pt to contact the office. 

## 2021-05-05 ENCOUNTER — Other Ambulatory Visit (HOSPITAL_COMMUNITY): Payer: Self-pay

## 2021-05-05 MED ORDER — ZOSTER VAC RECOMB ADJUVANTED 50 MCG/0.5ML IM SUSR
0.5000 mL | Freq: Once | INTRAMUSCULAR | 1 refills | Status: AC
  Filled 2021-05-05: qty 0.5, 1d supply, fill #0
  Filled 2021-07-25 – 2021-08-30 (×2): qty 0.5, 1d supply, fill #1
  Filled ????-??-??: fill #1

## 2021-05-12 ENCOUNTER — Other Ambulatory Visit (HOSPITAL_COMMUNITY): Payer: Self-pay | Admitting: Internal Medicine

## 2021-05-12 ENCOUNTER — Other Ambulatory Visit (HOSPITAL_COMMUNITY): Payer: Self-pay

## 2021-05-12 ENCOUNTER — Other Ambulatory Visit (INDEPENDENT_AMBULATORY_CARE_PROVIDER_SITE_OTHER): Payer: Self-pay | Admitting: Internal Medicine

## 2021-05-12 DIAGNOSIS — E039 Hypothyroidism, unspecified: Secondary | ICD-10-CM

## 2021-05-12 MED ORDER — BISOPROLOL FUMARATE 5 MG PO TABS
5.0000 mg | ORAL_TABLET | Freq: Every day | ORAL | 0 refills | Status: DC
  Filled 2021-05-12 – 2021-08-12 (×2): qty 30, 30d supply, fill #0

## 2021-05-12 MED ORDER — NP THYROID 120 MG PO TABS
120.0000 mg | ORAL_TABLET | Freq: Every day | ORAL | 0 refills | Status: DC
  Filled 2021-05-12: qty 90, 90d supply, fill #0

## 2021-05-12 MED ORDER — POTASSIUM CHLORIDE 20 MEQ/15ML (10%) PO SOLN
20.0000 meq | Freq: Every day | ORAL | 0 refills | Status: DC
  Filled 2021-05-12: qty 473, 31d supply, fill #0

## 2021-05-12 MED ORDER — SPIRONOLACTONE 25 MG PO TABS
25.0000 mg | ORAL_TABLET | Freq: Every day | ORAL | 0 refills | Status: DC
  Filled 2021-05-12: qty 30, 30d supply, fill #0

## 2021-05-16 ENCOUNTER — Other Ambulatory Visit (HOSPITAL_COMMUNITY): Payer: Self-pay

## 2021-05-18 ENCOUNTER — Other Ambulatory Visit (HOSPITAL_COMMUNITY): Payer: Self-pay

## 2021-05-18 MED ORDER — CLINDAMYCIN PHOS-BENZOYL PEROX 1-5 % EX GEL
1.0000 "application " | Freq: Every day | CUTANEOUS | 5 refills | Status: DC
  Filled 2021-05-18: qty 50, 50d supply, fill #0

## 2021-05-18 MED ORDER — TRIAMCINOLONE ACETONIDE 0.1 % EX OINT
1.0000 "application " | TOPICAL_OINTMENT | CUTANEOUS | 5 refills | Status: DC
  Filled 2021-05-18: qty 454, 90d supply, fill #0

## 2021-05-18 MED ORDER — TACROLIMUS 0.1 % EX OINT
1.0000 "application " | TOPICAL_OINTMENT | Freq: Two times a day (BID) | CUTANEOUS | 5 refills | Status: DC | PRN
  Filled 2021-05-18: qty 60, 30d supply, fill #0

## 2021-05-18 MED ORDER — CLOBETASOL PROPIONATE 0.05 % EX OINT
1.0000 "application " | TOPICAL_OINTMENT | CUTANEOUS | 5 refills | Status: DC
  Filled 2021-05-18: qty 45, 90d supply, fill #0

## 2021-05-19 ENCOUNTER — Other Ambulatory Visit (HOSPITAL_COMMUNITY): Payer: Self-pay

## 2021-05-19 MED ORDER — CLINDAMYCIN PHOS-BENZOYL PEROX 1.2-5 % EX GEL
CUTANEOUS | 11 refills | Status: DC
  Filled 2021-05-19: qty 45, 20d supply, fill #0

## 2021-05-24 ENCOUNTER — Telehealth (HOSPITAL_COMMUNITY): Payer: Self-pay | Admitting: Pharmacy Technician

## 2021-05-24 NOTE — Telephone Encounter (Signed)
Advanced Heart Failure Patient Advocate Encounter  [place] [facility] called and request a refill of Opsumit. Provided a verbal over the phone.  Charlann Boxer, CPhT

## 2021-06-01 ENCOUNTER — Other Ambulatory Visit (HOSPITAL_COMMUNITY): Payer: Self-pay | Admitting: *Deleted

## 2021-06-01 DIAGNOSIS — I272 Pulmonary hypertension, unspecified: Secondary | ICD-10-CM

## 2021-06-10 IMAGING — DX DG CHEST 2V
2 series · 2 of 2 positions shown · non-contrast
Comparison: 07/23/2017

CLINICAL DATA: CHF, shortness of breath, chest tightness, crackling
sounds and feeling in lower chest for over a week, history
interstitial lung disease, hypertension, CHF, scleroderma, GERD

EXAM:
CHEST - 2 VIEW

[chest pa]
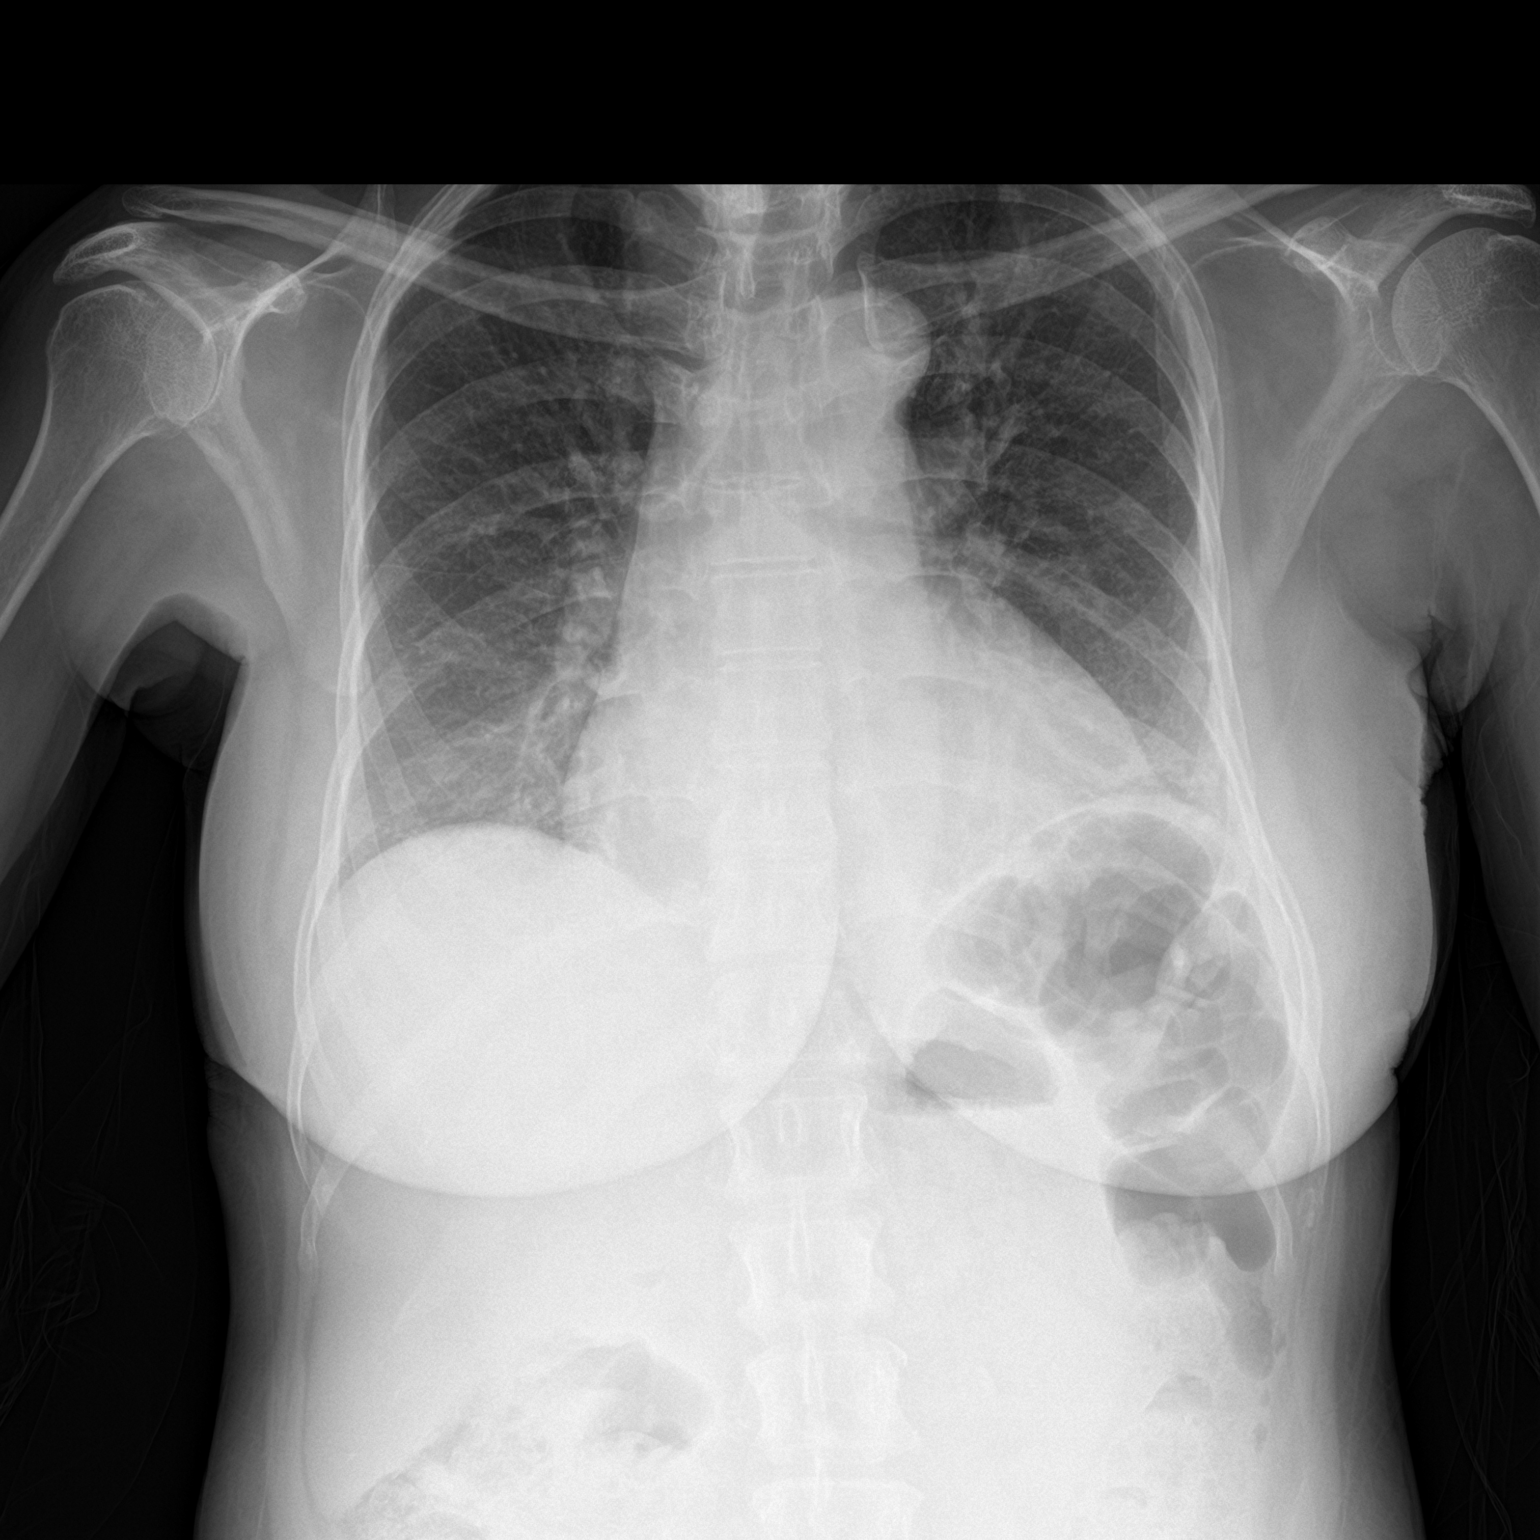

[chest lat]
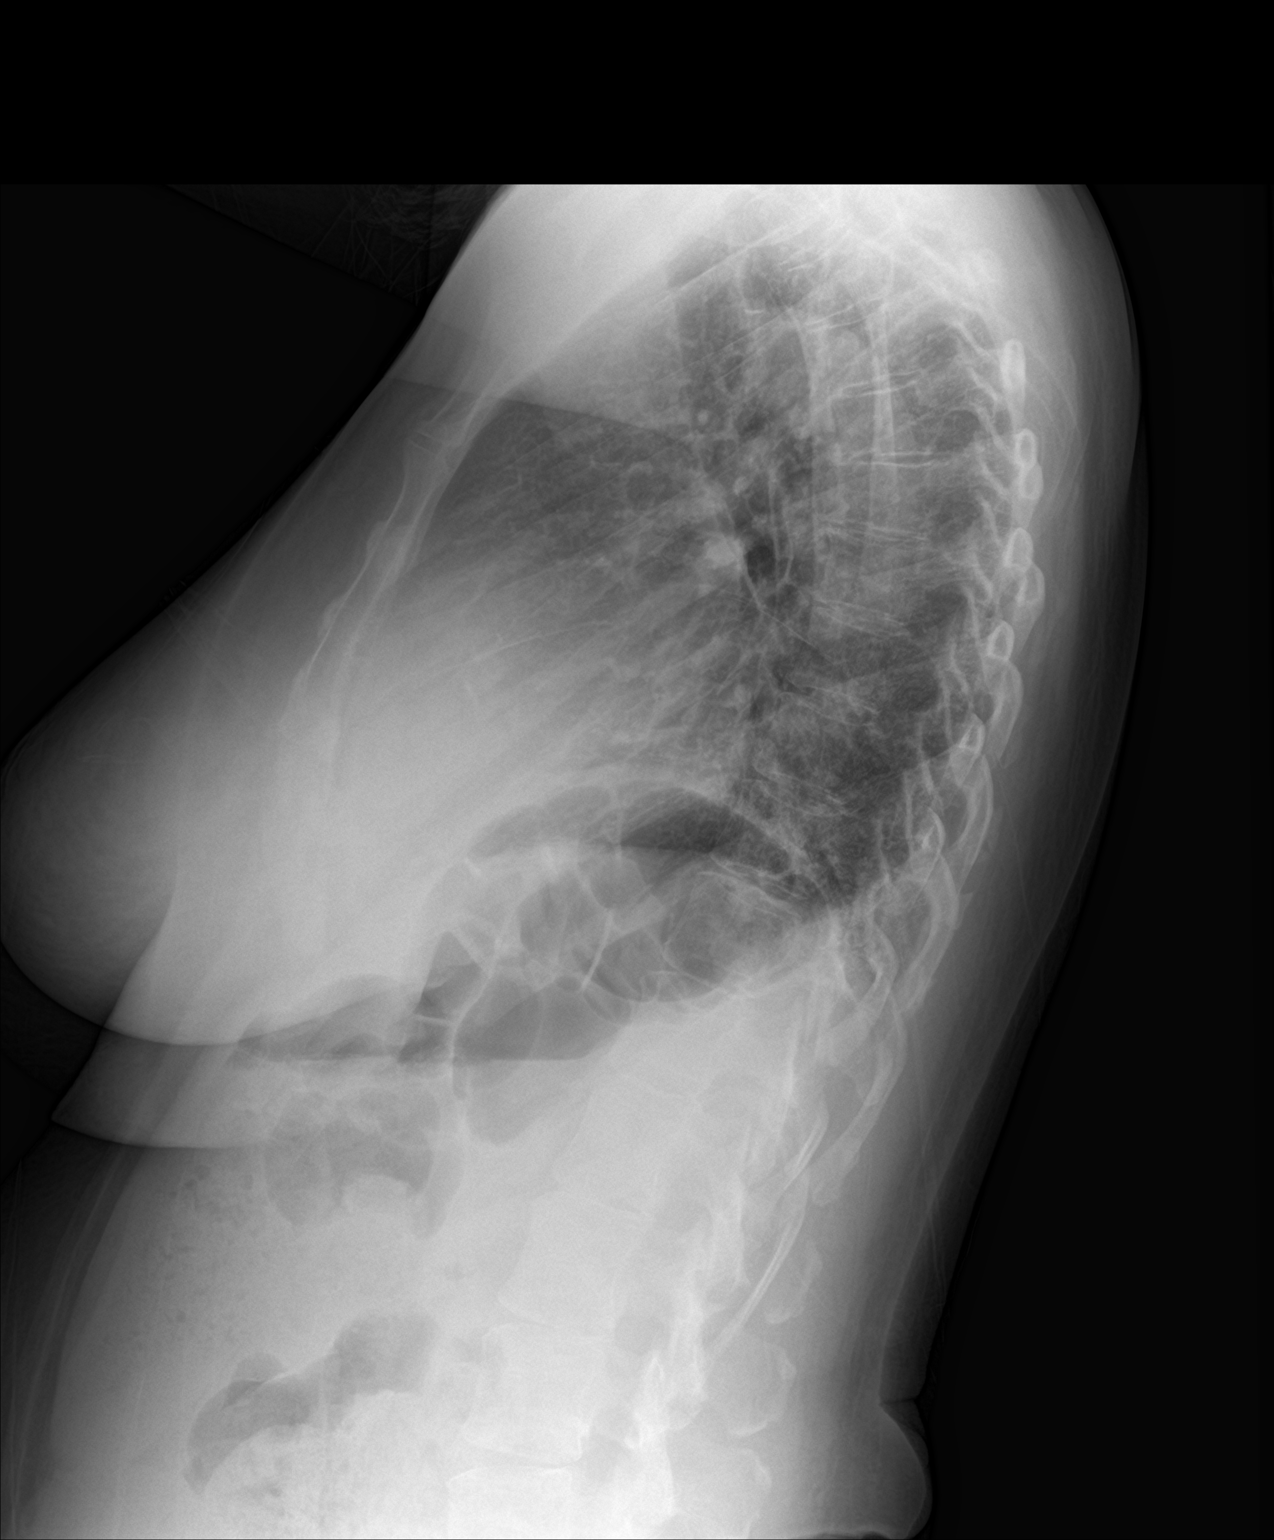

[2 of 2 positions shown; findings below may reference images not displayed]

FINDINGS: Enlargement of cardiac silhouette with pulmonary vascular
congestion.

Mediastinal contour stable.

Minimal bibasilar atelectasis.

No acute infiltrate, pleural effusion or pneumothorax.

Bones appear demineralized.
IMPRESSION: Enlargement of cardiac silhouette with pulmonary vascular
congestion.

Minimal bibasilar atelectasis without acute infiltrate.

## 2021-06-11 ENCOUNTER — Encounter (HOSPITAL_COMMUNITY): Payer: Self-pay | Admitting: *Deleted

## 2021-06-11 ENCOUNTER — Emergency Department (HOSPITAL_COMMUNITY): Payer: 59

## 2021-06-11 ENCOUNTER — Other Ambulatory Visit: Payer: Self-pay

## 2021-06-11 ENCOUNTER — Emergency Department (HOSPITAL_COMMUNITY)
Admission: EM | Admit: 2021-06-11 | Discharge: 2021-06-11 | Disposition: A | Discharge status: Home / Self Care | Payer: 59 | Attending: Emergency Medicine | Admitting: Emergency Medicine

## 2021-06-11 DIAGNOSIS — Z79899 Other long term (current) drug therapy: Secondary | ICD-10-CM | POA: Insufficient documentation

## 2021-06-11 DIAGNOSIS — I10 Essential (primary) hypertension: Secondary | ICD-10-CM | POA: Diagnosis not present

## 2021-06-11 DIAGNOSIS — J3489 Other specified disorders of nose and nasal sinuses: Secondary | ICD-10-CM

## 2021-06-11 DIAGNOSIS — J329 Chronic sinusitis, unspecified: Secondary | ICD-10-CM | POA: Insufficient documentation

## 2021-06-11 DIAGNOSIS — E041 Nontoxic single thyroid nodule: Secondary | ICD-10-CM | POA: Diagnosis not present

## 2021-06-11 DIAGNOSIS — R519 Headache, unspecified: Secondary | ICD-10-CM | POA: Diagnosis not present

## 2021-06-11 DIAGNOSIS — R9431 Abnormal electrocardiogram [ECG] [EKG]: Secondary | ICD-10-CM | POA: Diagnosis not present

## 2021-06-11 LAB — CBC WITH DIFFERENTIAL/PLATELET
Abs Immature Granulocytes: 0.01 10*3/uL (ref 0.00–0.07)
Basophils Absolute: 0 10*3/uL (ref 0.0–0.1)
Basophils Relative: 1 %
Eosinophils Absolute: 0.2 10*3/uL (ref 0.0–0.5)
Eosinophils Relative: 3 %
HCT: 40.7 % (ref 36.0–46.0)
Hemoglobin: 12.4 g/dL (ref 12.0–15.0)
Immature Granulocytes: 0 %
Lymphocytes Relative: 30 %
Lymphs Abs: 1.9 10*3/uL (ref 0.7–4.0)
MCH: 27.9 pg (ref 26.0–34.0)
MCHC: 30.5 g/dL (ref 30.0–36.0)
MCV: 91.5 fL (ref 80.0–100.0)
Monocytes Absolute: 0.7 10*3/uL (ref 0.1–1.0)
Monocytes Relative: 10 %
Neutro Abs: 3.7 10*3/uL (ref 1.7–7.7)
Neutrophils Relative %: 56 %
Platelets: 209 10*3/uL (ref 150–400)
RBC: 4.45 MIL/uL (ref 3.87–5.11)
RDW: 13.7 % (ref 11.5–15.5)
WBC: 6.5 10*3/uL (ref 4.0–10.5)
nRBC: 0 % (ref 0.0–0.2)

## 2021-06-11 LAB — BASIC METABOLIC PANEL
Anion gap: 8 (ref 5–15)
BUN: 11 mg/dL (ref 6–20)
CO2: 28 mmol/L (ref 22–32)
Calcium: 10.2 mg/dL (ref 8.9–10.3)
Chloride: 104 mmol/L (ref 98–111)
Creatinine, Ser: 0.69 mg/dL (ref 0.44–1.00)
GFR, Estimated: >60 mL/min (ref >60)
Glucose, Bld: 86 mg/dL (ref 70–99)
Potassium: 3.2 mmol/L — ABNORMAL LOW (ref 3.5–5.1)
Sodium: 140 mmol/L (ref 135–145)

## 2021-06-11 LAB — TSH: TSH: 1.304 u[IU]/mL (ref 0.350–4.500)

## 2021-06-11 MED ORDER — SPIRONOLACTONE 25 MG PO TABS
25.0000 mg | ORAL_TABLET | Freq: Every day | ORAL | Status: DC
  Administered 2021-06-11: 25 mg via ORAL
  Filled 2021-06-11: qty 1

## 2021-06-11 MED ORDER — AMLODIPINE BESYLATE 5 MG PO TABS
10.0000 mg | ORAL_TABLET | Freq: Once | ORAL | Status: AC
  Administered 2021-06-11: 10 mg via ORAL
  Filled 2021-06-11: qty 2

## 2021-06-11 MED ORDER — IBUPROFEN 400 MG PO TABS
400.0000 mg | ORAL_TABLET | Freq: Once | ORAL | Status: AC
Start: 2021-06-11 — End: 2021-06-11
  Administered 2021-06-11: 400 mg via ORAL
  Filled 2021-06-11: qty 1

## 2021-06-11 MED ORDER — PROCHLORPERAZINE MALEATE 10 MG PO TABS
10.0000 mg | ORAL_TABLET | Freq: Once | ORAL | Status: AC
  Administered 2021-06-11: 10 mg via ORAL
  Filled 2021-06-11 (×2): qty 1

## 2021-06-11 MED ORDER — AMLODIPINE BESYLATE 5 MG PO TABS: 5.0000 mg | ORAL_TABLET | Freq: Every day | ORAL | 0 refills | Status: DC

## 2021-06-11 MED ORDER — AMLODIPINE BESYLATE 5 MG PO TABS
5.0000 mg | ORAL_TABLET | Freq: Every day | ORAL | 0 refills | Status: DC
  Filled 2021-06-11: qty 30, 30d supply, fill #0

## 2021-06-11 MED ORDER — IOHEXOL 350 MG/ML SOLN
75.0000 mL | Freq: Once | INTRAVENOUS | Status: AC | PRN
  Administered 2021-06-11: 75 mL via INTRAVENOUS

## 2021-06-11 MED ORDER — DIPHENHYDRAMINE HCL 25 MG PO CAPS
25.0000 mg | ORAL_CAPSULE | Freq: Once | ORAL | Status: AC
  Administered 2021-06-11: 25 mg via ORAL
  Filled 2021-06-11: qty 1

## 2021-06-11 MED ORDER — SODIUM CHLORIDE 0.9 % IV BOLUS
500.0000 mL | Freq: Once | INTRAVENOUS | Status: AC
  Administered 2021-06-11: 500 mL via INTRAVENOUS

## 2021-06-11 NOTE — ED Triage Notes (Signed)
Sinus problems with headache for over a month, bloody mucus this am when blowing nose

## 2021-06-11 NOTE — ED Provider Notes (Addendum)
[facility] EMERGENCY DEPARTMENT Provider Note   CSN: [id number] Arrival date & time: 06/11/21  1635     History  Chief Complaint  Patient presents with   Headache    Karen [name] is a 51 y.o. female.  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  Patient states that she has been having sinus pressure for about a month's time, states she feels pressure mainly on her forehead and below her eyes bilaterally, she has nasal drainage, and some ear pressure on the left side.  Endorses a headache in the front of her head throbbing-like sensation intermittent no associate change in vision paresthesias or weakness upper lower extremities denies  recent head trauma, not on anticoagulant, denies  pain with eye movement difficulty moving her neck or opening closing her mouth.  No associated fevers or chills chest pain shortness of breath general body aches.  Went to urgent care started on antibiotics for about a week's time got better but symptoms have gotten worse.  Home Medications Prior to Admission medications   Medication Sig Start Date End Date Taking? Authorizing Provider  acetaminophen (TYLENOL) 325 MG tablet Take 2 tablets (650 mg total) by mouth every 6 (six) hours as needed for mild pain, fever or headache (or Fever >/= 101). 02/15/20   Roxan Hockey, MD  amLODipine (NORVASC) 5 MG tablet Take 1 tablet (5 mg total) by mouth daily. 06/11/21 07/11/21  Marcello Fennel, PA-C  azelastine (OPTIVAR) 0.05 % ophthalmic solution Place 1 drop into both eyes daily as needed (allergies).  09/29/19   [provider]  bisoprolol (ZEBETA) 5 MG tablet Take 1 tablet (5 mg total) by mouth daily. NEEDS OFFICE VISIT FOR ANY MORE REFILLS 05/12/21   Bensimhon, Shaune Pascal, MD  clindamycin (CLINDAGEL) 1 % gel as needed.    [provider]   Clindamycin-Benzoyl Per, Refr, gel Apply to face each morning 05/18/21     clindamycin-benzoyl peroxide (BENZACLIN) gel Apply 1 application topically to the face daily. 05/18/21     clobetasol ointment (TEMOVATE) 0.05 % Apply to scalp three times weekly. 05/18/21     esomeprazole (NEXIUM) 40 MG capsule Take 1 capsule (40 mg total) by mouth 2 (two) times daily. 03/07/21   Erenest Rasher, PA-C  furosemide (LASIX) 40 MG tablet Take 1 tablet (40 mg total) by mouth daily as needed for edema. Except Saturdays and Sundays 11/22/20   Bensimhon, Shaune Pascal, MD  levocetirizine (XYZAL) 5 MG tablet levocetirizine 5 mg tablet  TAKE 1 TABLET BY MOUTH DAILY    [provider]  macitentan (OPSUMIT) 10 MG tablet Take 1 tablet (10 mg total) by mouth daily. 04/27/20   Bensimhon, Shaune Pascal, MD  Multiple Vitamin (MULTIVITAMIN ADULT PO) Take by mouth.    [provider]  mycophenolate (CELLCEPT) 500 MG tablet Take 3 tablets (1,500 mg total) by mouth every morning AND 3 tablets (1,500 mg total) every evening. 01/14/21   Mannam, Hart Robinsons, MD  Nintedanib (OFEV) 150 MG CAPS Take 1 capsule (150 mg total) by mouth 2 (two) times daily. 01/20/21   Marshell Garfinkel, MD  NP THYROID 120 MG tablet Take 1 tablet (120 mg total) by mouth daily before breakfast. 05/12/21   Lindell Spar, MD  potassium chloride 20 MEQ/15ML (10%) SOLN Take 15 mLs (20 mEq total) by mouth daily. NEEDS OFFICE VISIT FOR ANY MORE REFILLS 05/12/21  Bensimhon, Shaune Pascal, MD  Prucalopride Succinate (MOTEGRITY) 1 MG TABS Take 1 tablet (1 mg) by mouth daily. 04/07/21   Erenest Rasher, PA-C  spironolactone (ALDACTONE) 25 MG tablet Take 1 tablet (25 mg total) by mouth daily. NEEDS OFFICE VISIT FOR ANY MORE REFILLS 05/12/21   Bensimhon, Shaune Pascal, MD  tacrolimus (PROTOPIC) 0.1 % ointment Apply to dry skin on face and body daily to twice daily as needed. 05/18/21     triamcinolone ointment (KENALOG) 0.1 % Apply to affected itching areas on body twice daily for 2  weeks, then daily for 2 weeks, then every other day for 2 weeks. Not to face. 05/18/21     PARoxetine (PAXIL) 10 MG tablet Take 1 tablet (10 mg total) by mouth daily as directed 10/07/20 12/02/20        Allergies    Lisinopril    Review of Systems   Review of Systems  Constitutional:  Negative for chills and fever.  HENT:  Positive for congestion, ear pain and sinus pressure. Negative for ear discharge and tinnitus.   Eyes:  Negative for visual disturbance.  Respiratory:  Negative for shortness of breath.   Cardiovascular:  Negative for chest pain.  Gastrointestinal:  Negative for abdominal pain.  Neurological:  Positive for headaches. Negative for weakness and numbness.   Physical Exam Updated Vital Signs BP (!) 191/110    Pulse 86    Temp 98 F (36.7 C) (Oral)    Resp 17    SpO2 98%  Physical Exam Vitals and nursing note reviewed.  Constitutional:      General: She is not in acute distress.    Appearance: She is not ill-appearing.  HENT:     Head: Normocephalic and atraumatic.     Comments: Slight edema noted on the right side of patient's face in the maxillary region, No overlying skin changes, no fluctuance or induration present,, patient had tenderness bilaterally on her maxillary sinuses, no other gross abnormalities present.    Right Ear: Tympanic membrane, ear canal and external ear normal.     Left Ear: Tympanic membrane, ear canal and external ear normal.     Nose: Congestion present.     Comments: Erythematous turbinates bilaterally    Mouth/Throat:     Mouth: Mucous membranes are moist.     Pharynx: Oropharynx is clear. No oropharyngeal exudate or posterior oropharyngeal erythema.     Comments: No trismus or torticollis, tonsils equal symmetric bilaterally no exudates or erythema present no tongue elevation, no gingivitis no evidence of dental decay Eyes:     Extraocular Movements: Extraocular movements intact.     Conjunctiva/sclera: Conjunctivae normal.     Pupils:  Pupils are equal, round, and reactive to light.     Comments: No pain with eye movements  Cardiovascular:     Rate and Rhythm: Normal rate and regular rhythm.     Pulses: Normal pulses.     Heart sounds: No murmur heard.   No friction rub. No gallop.  Pulmonary:     Effort: No respiratory distress.     Breath sounds: No wheezing, rhonchi or rales.  Abdominal:     Palpations: Abdomen is soft.     Tenderness: There is no abdominal tenderness. There is no right CVA tenderness or left CVA tenderness.  Musculoskeletal:     Cervical back: No rigidity.     Right lower leg: No edema.     Left lower leg: No edema.  Skin:  General: Skin is warm and dry.  Neurological:     Mental Status: She is alert.     GCS: GCS eye subscore is 4. GCS verbal subscore is 5. GCS motor subscore is 6.     Cranial Nerves: No facial asymmetry.     Sensory: Sensation is intact.     Motor: No weakness.     Coordination: Coordination is intact. Romberg sign negative.     Comments: Cranial nerves II through XII grossly intact, no difficult word finding he will follow two-step commands no unilateral weakness present.  Psychiatric:        Mood and Affect: Mood normal.    ED Results / Procedures / Treatments   Labs (all labs ordered are listed, but only abnormal results are displayed) Labs Reviewed  BASIC METABOLIC PANEL - Abnormal; Notable for the following components:      Result Value   Potassium 3.2 (*)    All other components within normal limits  CBC WITH DIFFERENTIAL/PLATELET  TSH    EKG EKG Interpretation  Date/Time:  Saturday June 11 2021 20:27:05 EST Ventricular Rate:  81 PR Interval:  187 QRS Duration: 81 QT Interval:  358 QTC Calculation: 416 R Axis:   -44 Text Interpretation: Sinus rhythm Probable left atrial enlargement Inferior infarct, age indeterminate Anterolateral infarct, age indeterminate Baseline wander in lead(s) V6 When compared with ECG of 07/08/2020, No significant change  was found Confirmed by Delora Fuel ([id number]) on 06/11/2021 10:53:47 PM  Radiology CT Head Wo Contrast  Result Date: 06/11/2021 CLINICAL DATA:  Maxillary/facial abscess.  Headache for a month. EXAM: CT HEAD WITHOUT CONTRAST CT MAXILLOFACIAL WITHOUT CONTRAST TECHNIQUE: Multidetector CT imaging of the head and maxillofacial structures were performed using the standard protocol without intravenous contrast. Multiplanar CT image reconstructions of the maxillofacial structures were also generated. RADIATION DOSE REDUCTION: This exam was performed according to the departmental dose-optimization program which includes automated exposure control, adjustment of the mA and/or kV according to patient size and/or use of iterative reconstruction technique. COMPARISON:  None. FINDINGS: CT HEAD FINDINGS Brain: Ventricles are normal in size and configuration. There is no mass, hemorrhage, edema or other evidence of acute parenchymal abnormality. No extra-axial hemorrhage. Vascular: No hyperdense vessel or unexpected calcification. Skull: Normal. Negative for fracture or focal lesion. Other: None. CT MAXILLOFACIAL FINDINGS Osseous: Lower frontal bones are intact. Osseous structures about the orbits appear intact and normal in mineralization. Walls of the maxillary sinuses appear intact and normal in mineralization. Bilateral zygoma and pterygoid plates appear normal. Mandible appears intact and normal in mineralization. Evaluation of the maxillary and mandibular teeth reveal no convincing periapical lucency to suggest a dental abscess. Orbits: Negative. No traumatic or inflammatory finding. Sinuses: Sinuses are clear. Soft tissues: Soft tissues overlying the facial bones appear normal, without abscess/fluid collection or evidence of inflammatory change. Deeper soft tissues of the oral cavity and hypopharynx are unremarkable. Incidental note is made of a hypodense lesion within the RIGHT thyroid lobe, with associated calcification,  measuring approximately 2 cm greatest dimension. IMPRESSION: 1. No acute intracranial abnormality. No intracranial mass, hemorrhage or edema. 2. Facial bones appear normal. Soft tissues overlying the facial bones appear normal, without abscess/fluid collection or other evidence of inflammatory change. 3. Incidental note is made of a hypodense lesion within the RIGHT thyroid lobe, measuring approximately 2 cm greatest dimension, with associated calcification. Recommend nonemergent thyroid US. (Ref: J Am Coll Radiol. 2015 Feb;12(2): 143-50). Electronically Signed   By: Roxy Horseman.D.  On: 06/11/2021 18:52   CT VENOGRAM HEAD  Result Date: 06/11/2021 CLINICAL DATA:  Headache, chronic, new features or increased frequency EXAM: CT VENOGRAM HEAD TECHNIQUE: Venographic phase images of the brain were obtained following the administration of intravenous contrast. Multiplanar reformats and maximum intensity projections were generated. RADIATION DOSE REDUCTION: This exam was performed according to the departmental dose-optimization program which includes automated exposure control, adjustment of the mA and/or kV according to patient size and/or use of iterative reconstruction technique. CONTRAST:  73mL OMNIPAQUE IOHEXOL 350 MG/ML SOLN COMPARISON:  No prior CT venogram. Correlation is made with 06/11/2021 CT head FINDINGS: Superior sagittal sinus: Normal. Straight sinus: Normal. Inferior sagittal sinus, vein of Galen and internal cerebral veins: Normal. Transverse sinuses: Normal. Sigmoid sinuses: Normal. Visualized jugular veins: Normal IMPRESSION: No evidence of venous sinus thrombosis. Electronically Signed   By: Merilyn Baba M.D.   On: 06/11/2021 21:31   CT Maxillofacial Wo Contrast  Result Date: 06/11/2021 CLINICAL DATA:  Maxillary/facial abscess.  Headache for a month. EXAM: CT HEAD WITHOUT CONTRAST CT MAXILLOFACIAL WITHOUT CONTRAST TECHNIQUE: Multidetector CT imaging of the head and maxillofacial structures  were performed using the standard protocol without intravenous contrast. Multiplanar CT image reconstructions of the maxillofacial structures were also generated. RADIATION DOSE REDUCTION: This exam was performed according to the departmental dose-optimization program which includes automated exposure control, adjustment of the mA and/or kV according to patient size and/or use of iterative reconstruction technique. COMPARISON:  None. FINDINGS: CT HEAD FINDINGS Brain: Ventricles are normal in size and configuration. There is no mass, hemorrhage, edema or other evidence of acute parenchymal abnormality. No extra-axial hemorrhage. Vascular: No hyperdense vessel or unexpected calcification. Skull: Normal. Negative for fracture or focal lesion. Other: None. CT MAXILLOFACIAL FINDINGS Osseous: Lower frontal bones are intact. Osseous structures about the orbits appear intact and normal in mineralization. Walls of the maxillary sinuses appear intact and normal in mineralization. Bilateral zygoma and pterygoid plates appear normal. Mandible appears intact and normal in mineralization. Evaluation of the maxillary and mandibular teeth reveal no convincing periapical lucency to suggest a dental abscess. Orbits: Negative. No traumatic or inflammatory finding. Sinuses: Sinuses are clear. Soft tissues: Soft tissues overlying the facial bones appear normal, without abscess/fluid collection or evidence of inflammatory change. Deeper soft tissues of the oral cavity and hypopharynx are unremarkable. Incidental note is made of a hypodense lesion within the RIGHT thyroid lobe, with associated calcification, measuring approximately 2 cm greatest dimension. IMPRESSION: 1. No acute intracranial abnormality. No intracranial mass, hemorrhage or edema. 2. Facial bones appear normal. Soft tissues overlying the facial bones appear normal, without abscess/fluid collection or other evidence of inflammatory change. 3. Incidental note is made of a  hypodense lesion within the RIGHT thyroid lobe, measuring approximately 2 cm greatest dimension, with associated calcification. Recommend nonemergent thyroid US. (Ref: J Am Coll Radiol. 2015 Feb;12(2): 143-50). Electronically Signed   By: Franki Cabot M.D.   On: 06/11/2021 18:52    Procedures Procedures    Medications Ordered in ED Medications  spironolactone (ALDACTONE) tablet 25 mg (25 mg Oral Given 06/11/21 2035)  prochlorperazine (COMPAZINE) tablet 10 mg (10 mg Oral Given 06/11/21 1949)  diphenhydrAMINE (BENADRYL) capsule 25 mg (25 mg Oral Given 06/11/21 1821)  ibuprofen (ADVIL) tablet 400 mg (400 mg Oral Given 06/11/21 1821)  sodium chloride 0.9 % bolus 500 mL (0 mLs Intravenous Stopped 06/11/21 2121)  iohexol (OMNIPAQUE) 350 MG/ML injection 75 mL (75 mLs Intravenous Contrast Given 06/11/21 2106)  amLODipine (NORVASC) tablet 10 mg (  10 mg Oral Given 06/11/21 2124)    ED Course/ Medical Decision Making/ A&P                           Medical Decision Making Amount and/or Complexity of Data Reviewed Labs: ordered. Radiology: ordered.  Risk Prescription drug management.   This patient presents to the ED for concern of headache, sinus pressure, this involves an extensive number of treatment options, and is a complaint that carries with it a high risk of complications and morbidity.  The differential diagnosis includes periorbital, orbital cellulitis, CVA,    Additional history obtained:  Additional history obtained from electronic medical record    Co morbidities that complicate the patient evaluation  Scleroderma, on immunotherapy  Social Determinants of Health:  N/A    Lab Tests:  I Ordered, and personally interpreted labs.  The pertinent results include: CBC unremarkable BMP unremarkable   Imaging Studies ordered:  I ordered imaging studies including CT head maxillofacial I independently visualized and interpreted imaging which showed both negative for acute findings,  does show incidental finding of a large hypodensity in the right aspect of the thyroid. I agree with the radiologist interpretation   Medicines ordered and prescription drug management:  I ordered medication including Benadryl, ibuprofen for headache I have reviewed the patients home medicines and have made adjustments as needed   Reevaluation: Reassessed after medications, states she has no complaints, it was noted that she has an elevated BP states that she not take her blood pressure medication today, states she will take it when she gets home.  She is eager for discharge.  Rule out low suspicion for internal head bleed and or mass as CT imaging is negative for acute findings.  Low suspicion for CVA she has no focal deficit present my exam.  Low suspicion for dissection of the vertebral or carotid artery as presentation atypical of etiology.  Low suspicion for meningitis as she has no meningeal sign present.  Low suspicion for periorbital or orbital cellulitis is no overlying skin changes, CT maxillofacial negative for signs of infection.  Low suspicion for dental abscess oropharynx visualized no gingivitis or dental cavities present my exam.  Low suspicion for neuralgia as presentation atypical etiology.  I have low suspicion for hypertensive emergency as there is no organ damage present, she does have an elevated BP patient she not to repeat BP medication we will have her take when she gets home.     Dispostion and problem list  After consideration of the diagnostic results and the patients response to treatment, I feel that the patent would benefit from nasal decongestions discharge.  Headaches-improved unclear etiology possible sinus related, will provide her with nasal decongestions, recommend counter pain medications follow-up with ENT for further evaluation given strict return precautions. Incidental finding-patient was made aware of the finding on her CT scan she will follow-up  with PCP for further evaluation.   Addendum-as patient is being discharged patient's attempted to ambulate and had a severe headache felt as if she were to pass out and had to sit back down.  Reassessed the patient states she still has a throbbing headache on the left side of her head, will place patient back in room and reasses  Patient is reassessed still continues have a headache, due to her immunocompromise state as well as having scleroderma concern for possible venous sinus thrombosis versus encephalopathy we will consult with neurology for further recommendations spoke  with Dr.Khaliqdina from neurology reviewed patient's case does not feel that an LP is necessary as she is afebrile no leukocytosis symptoms are gone for greater than a month's time, does recommend CT venogram, treat patient's BP as long as BP is improving if imaging is negative she can discharge home with close neurology follow-up.  Patient care was unremarkable, TSH was also unremarkable, patient was given amlodipine and BP has improved personally saw the BP systolic was 147/829, states that her headache has improved has no complaints, she is ambulating at difficulty.  Will discharge home start her on amlodipine have her continue with her home blood pressure medications, follow-up with cardiology for continued BP management, will also follow-up with neurology for her headache.  Gave strict return precautions.            Final Clinical Impression(s) / ED Diagnoses Final diagnoses:  Bad headache  Sinus pressure    Rx / DC Orders ED Discharge Orders          Ordered    amLODipine (NORVASC) 5 MG tablet  Daily,   Status:  Discontinued        06/11/21 2243    amLODipine (NORVASC) 5 MG tablet  Daily        06/11/21 2258              Marcello Fennel, PA-C 06/11/21 1944    Marcello Fennel, PA-C 06/11/21 2300    Noemi Chapel, MD 06/12/21 [phone]

## 2021-06-11 NOTE — ED Notes (Signed)
Pt in gown.

## 2021-06-11 NOTE — Discharge Instructions (Addendum)
Sinus pressure -like you to start  taking Flonase as well as Claritin/Zyrtec's to help with nasal draining.  For your headaches continue with ibuprofen Tylenol as needed, follow-up with ENT as needed. Hypertension-please continue home medications, have also started you on a new blood medication please take as prescribed please follow-up with cardiology for further evaluation. Headaches-please continue with over-the-counter pain medications, like follow-up with neurology for further evaluation.  It is noted that on your CT scan it did show possible density on the right side of your thyroid I have given you a copy of it please follow-up with PCP for further evaluation  Come back to the emergency department if you develop chest pain, shortness of breath, severe abdominal pain, uncontrolled nausea, vomiting, diarrhea.

## 2021-06-11 NOTE — ED Notes (Signed)
Ambulated pt around the nurses station, pt stated she felt fine and pt headache was better than the first time. RN and PA notified with these improvements

## 2021-06-11 NOTE — ED Notes (Signed)
Pt returned from CT Scan 

## 2021-06-13 ENCOUNTER — Other Ambulatory Visit (HOSPITAL_COMMUNITY): Payer: Self-pay

## 2021-07-01 DIAGNOSIS — B9689 Other specified bacterial agents as the cause of diseases classified elsewhere: Secondary | ICD-10-CM | POA: Diagnosis not present

## 2021-07-01 DIAGNOSIS — J329 Chronic sinusitis, unspecified: Secondary | ICD-10-CM | POA: Diagnosis not present

## 2021-07-01 DIAGNOSIS — R0789 Other chest pain: Secondary | ICD-10-CM | POA: Diagnosis not present

## 2021-07-21 ENCOUNTER — Other Ambulatory Visit (HOSPITAL_COMMUNITY): Payer: Self-pay | Admitting: *Deleted

## 2021-07-21 ENCOUNTER — Other Ambulatory Visit (HOSPITAL_COMMUNITY): Payer: Self-pay

## 2021-07-21 ENCOUNTER — Ambulatory Visit (HOSPITAL_BASED_OUTPATIENT_CLINIC_OR_DEPARTMENT_OTHER): Payer: 59

## 2021-07-21 ENCOUNTER — Ambulatory Visit (HOSPITAL_COMMUNITY)
Admission: RE | Admit: 2021-07-21 | Discharge: 2021-07-21 | Disposition: A | Discharge status: Home / Self Care | Payer: 59 | Source: Ambulatory Visit | Attending: Internal Medicine | Admitting: Internal Medicine

## 2021-07-21 ENCOUNTER — Other Ambulatory Visit: Payer: Self-pay

## 2021-07-21 VITALS — BP 140/92 | HR 93 | Wt 134.5 lb

## 2021-07-21 DIAGNOSIS — I10 Essential (primary) hypertension: Secondary | ICD-10-CM | POA: Diagnosis not present

## 2021-07-21 DIAGNOSIS — I272 Pulmonary hypertension, unspecified: Secondary | ICD-10-CM | POA: Diagnosis not present

## 2021-07-21 DIAGNOSIS — I509 Heart failure, unspecified: Secondary | ICD-10-CM | POA: Diagnosis not present

## 2021-07-21 DIAGNOSIS — I358 Other nonrheumatic aortic valve disorders: Secondary | ICD-10-CM | POA: Diagnosis not present

## 2021-07-21 DIAGNOSIS — M349 Systemic sclerosis, unspecified: Secondary | ICD-10-CM

## 2021-07-21 DIAGNOSIS — R0789 Other chest pain: Secondary | ICD-10-CM | POA: Diagnosis not present

## 2021-07-21 DIAGNOSIS — J849 Interstitial pulmonary disease, unspecified: Secondary | ICD-10-CM | POA: Diagnosis not present

## 2021-07-21 DIAGNOSIS — R079 Chest pain, unspecified: Secondary | ICD-10-CM | POA: Insufficient documentation

## 2021-07-21 DIAGNOSIS — I110 Hypertensive heart disease with heart failure: Secondary | ICD-10-CM | POA: Insufficient documentation

## 2021-07-21 DIAGNOSIS — I5032 Chronic diastolic (congestive) heart failure: Secondary | ICD-10-CM

## 2021-07-21 LAB — ECHOCARDIOGRAM COMPLETE
Area-P 1/2: 3.7 cm2
P 1/2 time: 310 msec
S' Lateral: 1.2 cm

## 2021-07-21 MED ORDER — AMLODIPINE BESYLATE 10 MG PO TABS
10.0000 mg | ORAL_TABLET | Freq: Every day | ORAL | 6 refills | Status: DC
  Filled 2021-07-21: qty 90, 90d supply, fill #0
  Filled 2021-11-23: qty 90, 90d supply, fill #1
  Filled 2022-06-02 (×3): qty 30, 30d supply, fill #2

## 2021-07-21 MED ORDER — SPIRONOLACTONE 25 MG PO TABS
25.0000 mg | ORAL_TABLET | Freq: Two times a day (BID) | ORAL | 6 refills | Status: DC
  Filled 2021-07-21: qty 180, 90d supply, fill #0
  Filled 2022-01-17: qty 180, 90d supply, fill #1
  Filled 2022-06-02: qty 60, 30d supply, fill #2

## 2021-07-21 MED ORDER — FUROSEMIDE 40 MG PO TABS
40.0000 mg | ORAL_TABLET | Freq: Every day | ORAL | 6 refills | Status: DC | PRN
  Filled 2021-07-21: qty 90, 90d supply, fill #0

## 2021-07-21 NOTE — H&P (View-Only) (Signed)
?  ? ?Heart Failure Clinic Note ? ?Date:  07/21/2021  ? ?ID:  Karen [name], DOB 1970-11-20, MRN [id number]  Location: Home  ?Provider location: [facility] ?Type of Visit: Established patient ? ?PCP:  Pcp, No  ?Cardiologist:  Mertie Moores, MD ?Primary HF: Karen [name] ? ?Chief Complaint: Heart Failure follow-up ?  ?History of Present Illness: ? 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 Gavin Pound for evaluation of pulmonary HTN in the setting of scleroderma. ?  ?[place] in 5/19 with minimally elevated pressures and hi-res CT which showed ILD. Follows with Dr. Vaughan Browner . F/u CT in 2/20 showed stable ILD ?  ?We saw her in 9/20 markedly SOB and ReDS very high @ 49%. CXR with mild pulmonary vascular congestion. ESR normal. Started lasix 40 daily and kcl 20 daily. Fluid got much better. Then switched to lasix 40 mg MWF but that wasn't enough so lasix increased to 5 days per week.  ? ?[place] 4/21 ? ?RA = 1 ?RV = 37/7 ?Karen = 38/16 (25) ?PCW = 6 ?Fick cardiac output/index = 5.5/3.4 ?PVR = 3.4 WU ?Ao sat = 97% ?Karen sat = 74%, 74% ?High SVC = 70% ? ?Started macitentan in May 21 after RHC.  ? ?Returns for f/u. Has not been feeling well lately. Went to ER on 06/11/21 for HA. SBP ~190. Had head CT which was normal. Started on amlodipine $RemoveBefor'5mg'[id number]$ . She also increased her spiro back to 25 bid. Had some LE so taking lasix at times. Was seen a few weeks ago for intermittent sharp CP. ECG ok. Remains very fatigued. LE edema improving. No recurrent CP. SOB with just mild activity. Continus on macitentan  ? ?Echo today 07/21/21 EF 60-65% RV ok Personally reviewed ? ? ?Studies: ?  ? ?Echo 10/21 EF 65-70% RV ok ? ?Hi-res CT 2/21 and 1/22: Stable ILD ? ?Echo 11/18/18: EF hyperdynamic 65-70% RV normal  ?  ?Echo 5/19: EF 60-65% grade I DD RV normal. Mild TR.  ?  ?PFTs  07/27/17: ?FVC 1.7 L, 56% ?FEV-1 1.5 1L, 62% ?DLCO 44% ?  ?[place] 5/19  ?RA = 2 ?RV = 33/6 ?Karen = 32/10 (19) ?PCW = 6 ?Fick cardiac output/index = 5.0/3.0 ?PVR = 2.6 ?Ao sat = 99% ?Karen sat = 74%, 75% ?  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  Aortic atherosclerosis, three-vessel coronary artery disease. ?  ?CT high-resolution 06/27/2018-stable interstitial lung disease. ?I reviewed the images personally. ? ?Hi res CT 02/05/19: stable ILD  ?  ?PFTs ? ?FENO 09/06/2017-unable to complete ? ?07/27/2017 ?FVC 1.65 (54%), FEV1 1.53 (62%], F/F 93, TLC 50, DLCO 44%, DLCO/VA 131% ?10/25/2017 ?FVC 1.51 [50%], FEV1 1.26 [51%], F/F 83 ?01/29/2018 ?FVC 1.69 [5%), FEV1 1.50 [61%], F/F 89, DLCO 10.57 [41%] ?01/06/2019 ?FVC 1.58 [52%], FEV1 1.46 [60%], F/F 92, TLC 2.62 [50%], DLCO 9.16 [42%] ?Severe restriction and diffusion impairment.  FVC is stable but DLCO has worsened. ?  ?6-minute walk  ?10/23/2017- 144 m ?Post walk heart rate, stats 94, 91% ?  ?02/04/2018- 249 m ?Post walk heart rate, sats 101, 99% ?  ?06/06/2018-212 m ?Post walk heart rate, sats 86, 92% ?  ?01/30/2019-156m ?Post walk heart rate, sats 83, 98% ? ?   ? ?Past Medical History:  ?Diagnosis Date  ? CHF (congestive heart failure) ([place])   ? GERD (  gastroesophageal reflux disease)   ? Hypertension   ? Hypothyroidism   ? ILD (interstitial lung disease) ([place]) 02/08/2018  ? Interstitial lung disease ([place])   ? Multinodular thyroid   ? benign FNA 08/2017.  ? Perimenopause 02/27/2019  ? Scleroderma ([place])   ? Vitamin D deficiency disease 02/27/2019  ? ?Past Surgical History:  ?Procedure Laterality Date  ? ABDOMINAL HERNIA REPAIR    ? BUNIONECTOMY Bilateral 10/2018  ? CHOLECYSTECTOMY N/A 02/13/2020  ? Procedure: LAPAROSCOPIC CHOLECYSTECTOMY;  Surgeon: Aviva Signs, MD;  Location: [facility];  Service: General;  Laterality: N/A;  ? COLONOSCOPY WITH PROPOFOL N/A 01/02/2019  ? Procedure: COLONOSCOPY WITH PROPOFOL;  Surgeon: Daneil Dolin, MD;  Location: [facility];  Service: Endoscopy;  Laterality: N/A;  12:30pm  ? ESOPHAGOGASTRODUODENOSCOPY (EGD) WITH PROPOFOL N/A 01/28/2018  ? erosive reflux esophagitis, patulous EG Junction, no dilation, incomplete EGD due to retained food in stomach. GES thereafter with delayed gastric emptying.   ? RIGHT HEART CATH N/A 09/27/2017  ? Procedure: RIGHT HEART CATH;  Surgeon: Jolaine Artist, MD;  Location: [facility];  Service: Cardiovascular;  Laterality: N/A;  ? RIGHT HEART CATH N/A 09/05/2019  ? Procedure: RIGHT HEART CATH;  Surgeon: Jolaine Artist, MD;  Location: [facility];  Service: Cardiovascular;  Laterality: N/A;  ? UTERINE FIBROID SURGERY    ? ? ? ?Current Outpatient Medications  ?Medication Sig Dispense Refill  ? acetaminophen (TYLENOL) 325 MG tablet Take 2 tablets (650 mg total) by mouth every 6 (six) hours as needed for mild pain, fever or headache (or Fever >/= 101). 12 tablet 0  ? amLODipine (NORVASC) 5 MG tablet Take 1 tablet (5 mg total) by mouth daily. 30 tablet 0  ? azelastine (OPTIVAR) 0.05 % ophthalmic solution Place 1 drop into both eyes daily as needed (allergies).     ? bisoprolol (ZEBETA) 5 MG tablet Take 1 tablet (5 mg total) by mouth daily. NEEDS OFFICE VISIT FOR ANY MORE REFILLS 30 tablet 0  ? clindamycin-benzoyl peroxide (BENZACLIN) gel Apply 1 application topically to the face daily. 50 g 5  ? clobetasol ointment (TEMOVATE) 0.05 % Apply to scalp three times weekly. 45 g 5  ? esomeprazole (NEXIUM) 40 MG capsule Take 1 capsule (40 mg total) by mouth 2 (two) times daily. 180 capsule 1  ? furosemide (LASIX) 40 MG tablet Take 1 tablet (40 mg total) by mouth daily as needed for edema. Except Saturdays and Sundays 30 tablet 6  ? levocetirizine (XYZAL) 5 MG tablet levocetirizine 5 mg tablet ? TAKE 1 TABLET BY MOUTH DAILY    ? macitentan (OPSUMIT) 10 MG tablet Take 1 tablet (10 mg total) by mouth daily. 30 tablet 11  ? Multiple Vitamin (MULTIVITAMIN ADULT PO) Take by mouth.    ? mycophenolate  (CELLCEPT) 500 MG tablet Take 3 tablets (1,500 mg total) by mouth every morning AND 3 tablets (1,500 mg total) every evening. 540 tablet 1  ? Nintedanib (OFEV) 150 MG CAPS Take 1 capsule (150 mg total) by mouth 2 (two) times daily. 60 capsule 4  ? NP THYROID 120 MG tablet Take 1 tablet (120 mg total) by mouth daily before breakfast. 90 tablet 0  ? potassium chloride 20 MEQ/15ML (10%) SOLN Take 15 mLs (20 mEq total) by mouth daily. NEEDS OFFICE VISIT FOR ANY MORE REFILLS 473 mL 0  ? Prucalopride Succinate (MOTEGRITY) 1 MG TABS Take 1 tablet (1 mg) by mouth daily. 60 tablet 1  ? spironolactone (  ALDACTONE) 25 MG tablet Take 25 mg by mouth 2 (two) times daily.    ? tacrolimus (PROTOPIC) 0.1 % ointment Apply to dry skin on face and body daily to twice daily as needed. 60 g 5  ? triamcinolone ointment (KENALOG) 0.1 % Apply to affected itching areas on body twice daily for 2 weeks, then daily for 2 weeks, then every other day for 2 weeks. Not to face. 454 g 5  ? ?No current facility-administered medications for this encounter.  ? ? ?Allergies:   Lisinopril  ? ?Social History:  The patient  reports that she has never smoked. She has never used smokeless tobacco. She reports that she does not drink alcohol and does not use drugs.  ? ?Family History:  The patient's family history includes Diabetes in her maternal grandfather, maternal uncle, and mother; Hypertension in her brother, father, and sister.  ? ?ROS:  Please see the history of present illness.   All other systems are personally reviewed and negative.  ? ?Vitals:  ? 07/21/21 1425  ?BP: (!) 140/92  ?Pulse: 93  ?SpO2: 98%  ?Weight: 61 kg (134 lb 8 oz)  ? ? ?Exam:   ?General:  Weak appearing. No resp difficulty ?HEENT: normal ?Neck: supple. JVP 7 Carotids 2+ bilat; no bruits. No lymphadenopathy or thryomegaly appreciated. ?Cor: PMI nondisplaced. Regular rate & rhythm. No rubs, gallops or murmurs. ?Lungs: clear ?Abdomen: soft, nontender, nondistended. No  hepatosplenomegaly. No bruits or masses. Good bowel sounds. ?Extremities: no cyanosis, clubbing, rash, tr-1+ edema + skin thickening  ?Neuro: alert & orientedx3, cranial nerves grossly intact. moves all 4 extremities w/o diffi

## 2021-07-21 NOTE — Progress Notes (Signed)
?  ? ?Heart Failure Clinic Note ? ?Date:  07/21/2021  ? ?ID:  Karen [name], DOB 1970/05/23, MRN [id number]  Location: Home  ?Provider location: [facility] ?Type of Visit: Established patient ? ?PCP:  Pcp, No  ?Cardiologist:  Mertie Moores, MD ?Primary HF: Ayelen Sciortino ? ?Chief Complaint: Heart Failure follow-up ?  ?History of Present Illness: ? 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 Gavin Pound for evaluation of pulmonary HTN in the setting of scleroderma. ?  ?[place] in 5/19 with minimally elevated pressures and hi-res CT which showed ILD. Follows with Dr. Vaughan Browner . F/u CT in 2/20 showed stable ILD ?  ?We saw her in 9/20 markedly SOB and ReDS very high @ 49%. CXR with mild pulmonary vascular congestion. ESR normal. Started lasix 40 daily and kcl 20 daily. Fluid got much better. Then switched to lasix 40 mg MWF but that wasn't enough so lasix increased to 5 days per week.  ? ?[place] 4/21 ? ?RA = 1 ?RV = 37/7 ?PA = 38/16 (25) ?PCW = 6 ?Fick cardiac output/index = 5.5/3.4 ?PVR = 3.4 WU ?Ao sat = 97% ?PA sat = 74%, 74% ?High SVC = 70% ? ?Started macitentan in May 21 after RHC.  ? ?Returns for f/u. Has not been feeling well lately. Went to ER on 06/11/21 for HA. SBP ~190. Had head CT which was normal. Started on amlodipine $RemoveBefor'5mg'[id number]$ . She also increased her spiro back to 25 bid. Had some LE so taking lasix at times. Was seen a few weeks ago for intermittent sharp CP. ECG ok. Remains very fatigued. LE edema improving. No recurrent CP. SOB with just mild activity. Continus on macitentan  ? ?Echo today 07/21/21 EF 60-65% RV ok Personally reviewed ? ? ?Studies: ?  ? ?Echo 10/21 EF 65-70% RV ok ? ?Hi-res CT 2/21 and 1/22: Stable ILD ? ?Echo 11/18/18: EF hyperdynamic 65-70% RV normal  ?  ?Echo 5/19: EF 60-65% grade I DD RV normal. Mild TR.  ?  ?PFTs  07/27/17: ?FVC 1.7 L, 56% ?FEV-1 1.5 1L, 62% ?DLCO 44% ?  ?[place] 5/19  ?RA = 2 ?RV = 33/6 ?PA = 32/10 (19) ?PCW = 6 ?Fick cardiac output/index = 5.0/3.0 ?PVR = 2.6 ?Ao sat = 99% ?PA sat = 74%, 75% ?  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  Aortic atherosclerosis, three-vessel coronary artery disease. ?  ?CT high-resolution 06/27/2018-stable interstitial lung disease. ?I reviewed the images personally. ? ?Hi res CT 02/05/19: stable ILD  ?  ?PFTs ? ?FENO 09/06/2017-unable to complete ? ?07/27/2017 ?FVC 1.65 (54%), FEV1 1.53 (62%], F/F 93, TLC 50, DLCO 44%, DLCO/VA 131% ?10/25/2017 ?FVC 1.51 [50%], FEV1 1.26 [51%], F/F 83 ?01/29/2018 ?FVC 1.69 [5%), FEV1 1.50 [61%], F/F 89, DLCO 10.57 [41%] ?01/06/2019 ?FVC 1.58 [52%], FEV1 1.46 [60%], F/F 92, TLC 2.62 [50%], DLCO 9.16 [42%] ?Severe restriction and diffusion impairment.  FVC is stable but DLCO has worsened. ?  ?6-minute walk  ?10/23/2017- 144 m ?Post walk heart rate, stats 94, 91% ?  ?02/04/2018- 249 m ?Post walk heart rate, sats 101, 99% ?  ?06/06/2018-212 m ?Post walk heart rate, sats 86, 92% ?  ?01/30/2019-142m ?Post walk heart rate, sats 83, 98% ? ?   ? ?Past Medical History:  ?Diagnosis Date  ? CHF (congestive heart failure) ([place])   ? GERD (  gastroesophageal reflux disease)   ? Hypertension   ? Hypothyroidism   ? ILD (interstitial lung disease) ([place]) 02/08/2018  ? Interstitial lung disease ([place])   ? Multinodular thyroid   ? benign FNA 08/2017.  ? Perimenopause 02/27/2019  ? Scleroderma ([place])   ? Vitamin D deficiency disease 02/27/2019  ? ?Past Surgical History:  ?Procedure Laterality Date  ? ABDOMINAL HERNIA REPAIR    ? BUNIONECTOMY Bilateral 10/2018  ? CHOLECYSTECTOMY N/A 02/13/2020  ? Procedure: LAPAROSCOPIC CHOLECYSTECTOMY;  Surgeon: Aviva Signs, MD;  Location: [facility];  Service: General;  Laterality: N/A;  ? COLONOSCOPY WITH PROPOFOL N/A 01/02/2019  ? Procedure: COLONOSCOPY WITH PROPOFOL;  Surgeon: Daneil Dolin, MD;  Location[facility];  Service: Endoscopy;  Laterality: N/A;  12:30pm  ? ESOPHAGOGASTRODUODENOSCOPY (EGD) WITH PROPOFOL N/A 01/28/2018  ? erosive reflux esophagitis, patulous EG Junction, no dilation, incomplete EGD due to retained food in stomach. GES thereafter with delayed gastric emptying.   ? RIGHT HEART CATH N/A 09/27/2017  ? Procedure: RIGHT HEART CATH;  Surgeon: Jolaine Artist, MD;  Location: [facility];  Service: Cardiovascular;  Laterality: N/A;  ? RIGHT HEART CATH N/A 09/05/2019  ? Procedure: RIGHT HEART CATH;  Surgeon: Jolaine Artist, MD;  Location: [facility];  Service: Cardiovascular;  Laterality: N/A;  ? UTERINE FIBROID SURGERY    ? ? ? ?Current Outpatient Medications  ?Medication Sig Dispense Refill  ? acetaminophen (TYLENOL) 325 MG tablet Take 2 tablets (650 mg total) by mouth every 6 (six) hours as needed for mild pain, fever or headache (or Fever >/= 101). 12 tablet 0  ? amLODipine (NORVASC) 5 MG tablet Take 1 tablet (5 mg total) by mouth daily. 30 tablet 0  ? azelastine (OPTIVAR) 0.05 % ophthalmic solution Place 1 drop into both eyes daily as needed (allergies).     ? bisoprolol (ZEBETA) 5 MG tablet Take 1 tablet (5 mg total) by mouth daily. NEEDS OFFICE VISIT FOR ANY MORE REFILLS 30 tablet 0  ? clindamycin-benzoyl peroxide (BENZACLIN) gel Apply 1 application topically to the face daily. 50 g 5  ? clobetasol ointment (TEMOVATE) 0.05 % Apply to scalp three times weekly. 45 g 5  ? esomeprazole (NEXIUM) 40 MG capsule Take 1 capsule (40 mg total) by mouth 2 (two) times daily. 180 capsule 1  ? furosemide (LASIX) 40 MG tablet Take 1 tablet (40 mg total) by mouth daily as needed for edema. Except Saturdays and Sundays 30 tablet 6  ? levocetirizine (XYZAL) 5 MG tablet levocetirizine 5 mg tablet ? TAKE 1 TABLET BY MOUTH DAILY    ? macitentan (OPSUMIT) 10 MG tablet Take 1 tablet (10 mg total) by mouth daily. 30 tablet 11  ? Multiple Vitamin (MULTIVITAMIN ADULT PO) Take by mouth.    ? mycophenolate  (CELLCEPT) 500 MG tablet Take 3 tablets (1,500 mg total) by mouth every morning AND 3 tablets (1,500 mg total) every evening. 540 tablet 1  ? Nintedanib (OFEV) 150 MG CAPS Take 1 capsule (150 mg total) by mouth 2 (two) times daily. 60 capsule 4  ? NP THYROID 120 MG tablet Take 1 tablet (120 mg total) by mouth daily before breakfast. 90 tablet 0  ? potassium chloride 20 MEQ/15ML (10%) SOLN Take 15 mLs (20 mEq total) by mouth daily. NEEDS OFFICE VISIT FOR ANY MORE REFILLS 473 mL 0  ? Prucalopride Succinate (MOTEGRITY) 1 MG TABS Take 1 tablet (1 mg) by mouth daily. 60 tablet 1  ? spironolactone (  ALDACTONE) 25 MG tablet Take 25 mg by mouth 2 (two) times daily.    ? tacrolimus (PROTOPIC) 0.1 % ointment Apply to dry skin on face and body daily to twice daily as needed. 60 g 5  ? triamcinolone ointment (KENALOG) 0.1 % Apply to affected itching areas on body twice daily for 2 weeks, then daily for 2 weeks, then every other day for 2 weeks. Not to face. 454 g 5  ? ?No current facility-administered medications for this encounter.  ? ? ?Allergies:   Lisinopril  ? ?Social History:  The patient  reports that she has never smoked. She has never used smokeless tobacco. She reports that she does not drink alcohol and does not use drugs.  ? ?Family History:  The patient's family history includes Diabetes in her maternal grandfather, maternal uncle, and mother; Hypertension in her brother, father, and sister.  ? ?ROS:  Please see the history of present illness.   All other systems are personally reviewed and negative.  ? ?Vitals:  ? 07/21/21 1425  ?BP: (!) 140/92  ?Pulse: 93  ?SpO2: 98%  ?Weight: 61 kg (134 lb 8 oz)  ? ? ?Exam:   ?General:  Weak appearing. No resp difficulty ?HEENT: normal ?Neck: supple. JVP 7 Carotids 2+ bilat; no bruits. No lymphadenopathy or thryomegaly appreciated. ?Cor: PMI nondisplaced. Regular rate & rhythm. No rubs, gallops or murmurs. ?Lungs: clear ?Abdomen: soft, nontender, nondistended. No  hepatosplenomegaly. No bruits or masses. Good bowel sounds. ?Extremities: no cyanosis, clubbing, rash, tr-1+ edema + skin thickening  ?Neuro: alert & orientedx3, cranial nerves grossly intact. moves all 4 extremities w/o diffi

## 2021-07-21 NOTE — Patient Instructions (Signed)
Increase Amlodipine to 10 mg Daily ? ?Take Furosemide every Monday and Friday, and other days as needed ? ?Take Potassium when you take Furosemide ? ?Continue Spironolactone 25 mg Twice daily  ? ?Heart Catheterization on 08/18/21, see instructions below ? ?Your physician recommends that you schedule a follow-up appointment in: 3 months ? ?If you have any questions or concerns before your next appointment please send [place] a message through [place] or call our office at [phone].   ? ?TO LEAVE A MESSAGE FOR THE NURSE SELECT OPTION 2, PLEASE LEAVE A MESSAGE INCLUDING: ?YOUR NAME ?DATE OF BIRTH ?CALL BACK NUMBER ?REASON FOR CALL**this is important as we prioritize the call backs ? ?YOU WILL RECEIVE A CALL BACK THE SAME DAY AS LONG AS YOU CALL BEFORE 4:00 PM ? ?At the [facility], you and your health needs are our priority. As part of our continuing mission to provide you with exceptional heart care, we have created designated Provider Care Teams. These Care Teams include your primary Cardiologist (physician) and Advanced Practice Providers (APPs- Physician Assistants and Nurse Practitioners) who all work together to provide you with the care you need, when you need it.  ? ?You may see any of the following providers on your designated Care Team at your next follow up: ?Dr Glori Bickers ?Dr Loralie Champagne ?Darrick Grinder, NP ?Lyda Jester, PA ?Jessica Milford,NP ?Marlyce Huge, PA ?Audry Riles, PharmD ? ? ?Please be sure to bring in all your medications bottles to every appointment.  ? ? ? ? ?CATHETERIZATION INSTRUCTIONS: ? ?You are scheduled for a Cardiac Catheterization on Thursday, April 13 with Dr. Glori Bickers. ? ?1. Please arrive at the Main Entrance A at [facility]: [place], [place] [address] at 8:30 AM (This time is two hours before your procedure to ensure your preparation). Free valet parking service is available.  ? ?Special note: Every effort is made to have  your procedure done on time. Please understand that emergencies sometimes delay scheduled procedures. ? ?2. Diet: Do not eat solid foods after midnight.  You may have clear liquids until 5 AM upon the day of the procedure. ? ?3. Labs: WILL BE DONE 4/13 AT [facility] ? ?4. Medication instructions in preparation for your procedure: ? ? Thursday 08/18/21 AM DO NOT TAKE FUROSEMIDE OR SPIRONOLACTONE ?  ?On the morning of your procedure, take any morning medicines NOT listed above.  You may use sips of water. ? ?5. Plan to go home the same day, you will only stay overnight if medically necessary. ?6. You MUST have a responsible adult to drive you home. ?7. An adult MUST be with you the first 24 hours after you arrive home. ?8. Bring a current list of your medications, and the last time and date medication taken. ?9. Bring ID and current insurance cards. ?10.Please wear clothes that are easy to get on and off and wear slip-on shoes. ? ?Thank you for allowing [place] to care for you! ?  -- [facility] ? ?

## 2021-07-21 NOTE — Progress Notes (Signed)
Pt stuck twice for labs (bmp) with no results, order cancelled and new order for bmp to be done at [facility] placed, pt is aware and will go there tomorrow for labs ?

## 2021-07-22 ENCOUNTER — Other Ambulatory Visit: Payer: 59 | Admitting: *Deleted

## 2021-07-22 DIAGNOSIS — I272 Pulmonary hypertension, unspecified: Secondary | ICD-10-CM | POA: Diagnosis not present

## 2021-07-23 LAB — BASIC METABOLIC PANEL
BUN/Creatinine Ratio: 18 (ref 9–23)
BUN: 12 mg/dL (ref 6–24)
CO2: 21 mmol/L (ref 20–29)
Calcium: 10.7 mg/dL — ABNORMAL HIGH (ref 8.7–10.2)
Chloride: 103 mmol/L (ref 96–106)
Creatinine, Ser: 0.65 mg/dL (ref 0.57–1.00)
Glucose: 102 mg/dL — ABNORMAL HIGH (ref 70–99)
Potassium: 4.2 mmol/L (ref 3.5–5.2)
Sodium: 139 mmol/L (ref 134–144)
eGFR: 105 mL/min/{1.73_m2} (ref >59)

## 2021-07-25 ENCOUNTER — Other Ambulatory Visit (HOSPITAL_COMMUNITY): Payer: Self-pay

## 2021-07-27 ENCOUNTER — Other Ambulatory Visit (HOSPITAL_COMMUNITY): Payer: 59

## 2021-07-28 ENCOUNTER — Encounter (HOSPITAL_COMMUNITY): Payer: 59 | Admitting: Internal Medicine

## 2021-08-04 ENCOUNTER — Encounter: Payer: Self-pay | Admitting: Internal Medicine

## 2021-08-04 ENCOUNTER — Other Ambulatory Visit (HOSPITAL_COMMUNITY): Payer: Self-pay

## 2021-08-04 ENCOUNTER — Ambulatory Visit: Payer: 59 | Admitting: Internal Medicine

## 2021-08-04 VITALS — BP 116/76 | Ht 64.0 in | Wt 135.6 lb

## 2021-08-04 DIAGNOSIS — I272 Pulmonary hypertension, unspecified: Secondary | ICD-10-CM

## 2021-08-04 DIAGNOSIS — E041 Nontoxic single thyroid nodule: Secondary | ICD-10-CM

## 2021-08-04 DIAGNOSIS — E039 Hypothyroidism, unspecified: Secondary | ICD-10-CM | POA: Insufficient documentation

## 2021-08-04 DIAGNOSIS — J849 Interstitial pulmonary disease, unspecified: Secondary | ICD-10-CM

## 2021-08-04 DIAGNOSIS — M349 Systemic sclerosis, unspecified: Secondary | ICD-10-CM

## 2021-08-04 DIAGNOSIS — I10 Essential (primary) hypertension: Secondary | ICD-10-CM | POA: Diagnosis not present

## 2021-08-04 DIAGNOSIS — N951 Menopausal and female climacteric states: Secondary | ICD-10-CM | POA: Diagnosis not present

## 2021-08-04 DIAGNOSIS — R1319 Other dysphagia: Secondary | ICD-10-CM

## 2021-08-04 HISTORY — DX: Nontoxic single thyroid nodule: E04.1

## 2021-08-04 HISTORY — DX: Pulmonary hypertension, unspecified: I27.20

## 2021-08-04 HISTORY — DX: Hypothyroidism, unspecified: E03.9

## 2021-08-04 MED ORDER — NP THYROID 120 MG PO TABS
120.0000 mg | ORAL_TABLET | Freq: Every day | ORAL | 0 refills | Status: DC
  Filled 2021-08-04 – 2022-01-17 (×2): qty 90, 90d supply, fill #0

## 2021-08-04 NOTE — Assessment & Plan Note (Signed)
Followed by Dr. Mannam On Ofev currently Has had flash pulmonary edema and pulmonary hypertension from it, followed by CHF clinic 

## 2021-08-04 NOTE — Assessment & Plan Note (Signed)
Lab Results  ?Component Value Date  ? TSH 1.304 06/11/2021  ? ?On NP thyroid 120 mg QD ?Check TSH and free T4 ?

## 2021-08-04 NOTE — Assessment & Plan Note (Signed)
Has had biopsy of thyroid nodule in the past ?Check US thyroid ?

## 2021-08-04 NOTE — Assessment & Plan Note (Signed)
Followed by Dr. Hawkes 

## 2021-08-04 NOTE — Patient Instructions (Addendum)
Please continue taking medications as prescribed. ? ?Please continue to follow heart healthy diet and perform moderate exercise as tolerated. ? ?Please discuss with your Cardiologist if you can get hormone treatment for hot flashes. ?

## 2021-08-04 NOTE — Assessment & Plan Note (Signed)
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

## 2021-08-04 NOTE — Assessment & Plan Note (Signed)
Advised to discuss with CHF physician about starting HRT, if deemed safe from cardiac standpoint, will start HRT 

## 2021-08-04 NOTE — Progress Notes (Signed)
? ?New Patient Office Visit ? ?Subjective:  ?Patient ID: Karen [name], female    DOB: [birth year]  Age: 51 y.o. MRN: [id number] ? ?CC:  ?Chief Complaint  ?Patient presents with  ? New Patient (Initial Visit)  ?  Pt would like to have blood work to check her thyroid.   ? ? ?HPI ?Karen [name] is a 51 y.o. female with past medical history of HTN, scleroderma, ILD, pulmonary hypertension, dysphagia, hypothyroidism, chronic constipation and hot flashes who presents for establishing care. ? ?HTN: She was recently placed on amlodipine for blood pressure control.  Her blood pressure is well controlled with amlodipine, Zebeta and spironolactone currently.  She has history of pulmonary hypertension from ILD, for which she takes spironolactone, Opsumit and Lasix currently.  She follows up with CHF clinic as well.  She currently denies any dyspnea or recent worsening of leg swelling.  She is going to get right and left heart cath in the next 2 weeks. ? ?She has history of scleroderma, for which she sees Dr. Trudie Reed. ? ?She sees Dr. 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  She is on Ofev for it. ? ?She has history of esophageal dysphagia as well.  She takes Nexium for GERD.  She has history of chronic constipation as well.  She follows up with [facility] gastroenterology.  She currently denies any diarrhea, melena or hematochezia. ? ?She complains of hot flashes, and was planning to start HRT.  She agrees to discuss it with her CHF physician before considering to start HRT. ? ?She has history of hypothyroidism, for which she takes NP thyroid.  She currently denies any tremors, palpitations, or recent change in weight or appetite. ? ? ? ? ?Past Medical History:  ?Diagnosis Date  ? Acute respiratory failure with hypoxia due to flash pulmonary edema requiring intubation 02/13/2020  ? Calculus of gallbladder with acute cholecystitis without obstruction   ? CHF (congestive heart failure) ([place])   ? GERD (gastroesophageal reflux  disease)   ? Hypertension   ? Hypothyroidism   ? ILD (interstitial lung disease) ([place]) 02/08/2018  ? Interstitial lung disease ([place])   ? Multinodular thyroid   ? benign FNA 08/2017.  ? Perimenopause 02/27/2019  ? Pulmonary edema   ? Scleroderma ([place])   ? Status post laparoscopic cholecystectomy 02/13/20 02/13/2020  ? Vitamin D deficiency disease 02/27/2019  ? ? ?Past Surgical History:  ?Procedure Laterality Date  ? ABDOMINAL HERNIA REPAIR    ? BUNIONECTOMY Bilateral 10/2018  ? CHOLECYSTECTOMY N/A 02/13/2020  ? Procedure: LAPAROSCOPIC CHOLECYSTECTOMY;  Surgeon: Aviva Signs, MD;  Location: [facility] ORS;  Service: General;  Laterality: N/A;  ? COLONOSCOPY WITH PROPOFOL N/A 01/02/2019  ? Procedure: COLONOSCOPY WITH PROPOFOL;  Surgeon: Daneil Dolin, MD;  Location: [facility];  Service: Endoscopy;  Laterality: N/A;  12:30pm  ? ESOPHAGOGASTRODUODENOSCOPY (EGD) WITH PROPOFOL N/A 01/28/2018  ? erosive reflux esophagitis, patulous EG Junction, no dilation, incomplete EGD due to retained food in stomach. GES thereafter with delayed gastric emptying.   ? RIGHT HEART CATH N/A 09/27/2017  ? Procedure: RIGHT HEART CATH;  Surgeon: Jolaine Artist, MD;  Location: [place] [facility];  Service: Cardiovascular;  Laterality: N/A;  ? RIGHT HEART CATH N/A 09/05/2019  ? Procedure: RIGHT HEART CATH;  Surgeon: Jolaine Artist, MD;  Location: [place] [facility];  Service: Cardiovascular;  Laterality: N/A;  ? UTERINE FIBROID SURGERY    ? ? ?Family History  ?Problem Relation Age of Onset  ? Hypertension Father   ?  Hypertension Sister   ? Hypertension Brother   ? Diabetes Maternal Grandfather   ? Diabetes Maternal Uncle   ? Diabetes Mother   ? Colon cancer Neg Hx   ? ? ?Social History  ? ?Socioeconomic History  ? Marital status: Single  ?  Spouse name: Not on file  ? Number of children: 0  ? Years of education: Not on file  ? Highest education level: Associate degree: occupational, Hotel manager, or vocational program  ?Occupational History  ?  Occupation: CMA  ?  Employer: [employer]  ?Tobacco Use  ? Smoking status: Never  ? Smokeless tobacco: Never  ?Vaping Use  ? Vaping Use: Former  ?Substance and Sexual Activity  ? Alcohol use: No  ? Drug use: No  ? Sexual activity: Yes  ?Other Topics Concern  ? Not on file  ?Social History Narrative  ? Patient is right-handed. She lives alone in one level home, a few steps to enter.CMA [facility].  ? ?Social Determinants of Health  ? ?Financial Resource Strain: Not on file  ?Food Insecurity: Not on file  ?Transportation Needs: Not on file  ?Physical Activity: Not on file  ?Stress: Not on file  ?Social Connections: Not on file  ?Intimate Partner Violence: Not on file  ? ? ?ROS ?Review of Systems  ?Constitutional:  Positive for fatigue. Negative for chills and fever.  ?HENT:  Negative for congestion, sinus pressure, sinus pain and sore throat.   ?Eyes:  Negative for pain and discharge.  ?Respiratory:  Positive for shortness of breath. Negative for cough.   ?Cardiovascular:  Negative for chest pain and palpitations.  ?Gastrointestinal:  Positive for constipation. Negative for abdominal pain, nausea and vomiting.  ?Endocrine: Positive for heat intolerance. Negative for polydipsia and polyuria.  ?Genitourinary:  Negative for dysuria and hematuria.  ?Musculoskeletal:  Positive for arthralgias. Negative for neck pain and neck stiffness.  ?Skin:  Negative for rash.  ?Neurological:  Negative for dizziness and weakness.  ?Psychiatric/Behavioral:  Negative for agitation and behavioral problems.   ? ?Objective:  ? ?Today's Vitals: BP 116/76 (BP Location: Right Arm, Patient Position: Sitting)   Ht '5\' 4"'$  (1.626 m)   Wt 135 lb 9.6 oz (61.5 kg)   BMI 23.28 kg/m?  ? ?Physical Exam ?Vitals reviewed.  ?Constitutional:   ?   General: She is not in acute distress. ?   Appearance: She is not diaphoretic.  ?HENT:  ?   Head: Normocephalic and atraumatic.  ?   Nose: Nose normal.  ?   Mouth/Throat:  ?   Mouth: Mucous  membranes are moist.  ?Eyes:  ?   General: No scleral icterus. ?   Extraocular Movements: Extraocular movements intact.  ?Cardiovascular:  ?   Rate and Rhythm: Normal rate and regular rhythm.  ?   Pulses: Normal pulses.  ?   Heart sounds: Normal heart sounds. No murmur heard. ?Pulmonary:  ?   Breath sounds: Normal breath sounds. No wheezing or rales.  ?Abdominal:  ?   Palpations: Abdomen is soft.  ?   Tenderness: There is no abdominal tenderness.  ?Musculoskeletal:  ?   Cervical back: Neck supple. No tenderness.  ?   Right lower leg: No edema.  ?   Left lower leg: No edema.  ?Skin: ?   General: Skin is warm.  ?   Findings: No rash.  ?Neurological:  ?   General: No focal deficit present.  ?   Mental Status: She is alert and oriented to person,  place, and time.  ?   Sensory: No sensory deficit.  ?   Motor: No weakness.  ?Psychiatric:     ?   Mood and Affect: Mood normal.     ?   Behavior: Behavior normal.  ? ? ?Assessment & Plan:  ? ?Problem List Items Addressed This Visit   ? ?  ? Cardiovascular and Mediastinum  ? HTN (hypertension)  ?  BP Readings from Last 1 Encounters:  ?08/04/21 116/76  ?Well-controlled with amlodipine, Zebeta and spironolactone ?Counseled for compliance with the medications ?Advised DASH diet and moderate exercise/walking, at least 150 mins/week ?  ?  ? Hot flashes due to menopause  ?  Advised to discuss with CHF physician about starting HRT, if deemed safe from cardiac standpoint, will start HRT ?  ?  ? Pulmonary hypertension ([place])  ?  Likely from ILD due to scleroderma ?Followed by CHF clinic ?Appears euvolemic currently, on spironolactone and as needed Lasix currently ?On Opsumit currently ?  ?  ?  ? Respiratory  ? ILD (interstitial lung disease) ([place])  ?  Followed by Dr. Vaughan Browner ?On Ofev currently ?Has had flash pulmonary edema and pulmonary hypertension from it, followed by CHF clinic ?  ?  ?  ? Digestive  ? Esophageal dysphagia  ?  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  ?  ?  ? Endocrine  ? Hypothyroidism, adult  ?  Lab Results  ?Component Value Date  ? TSH 1.304 06/11/2021  ?On NP thyroid 120 mg QD ?Check TSH and free T4 ?  ?  ? Relevant Med

## 2021-08-04 NOTE — Assessment & Plan Note (Signed)
BP Readings from Last 1 Encounters:  ?08/04/21 116/76  ? ?Well-controlled with amlodipine, Zebeta and spironolactone ?Counseled for compliance with the medications ?Advised DASH diet and moderate exercise/walking, at least 150 mins/week ?

## 2021-08-04 NOTE — Assessment & Plan Note (Signed)
Likely from ILD due to scleroderma Followed by CHF clinic Appears euvolemic currently, on spironolactone and as needed Lasix currently On Opsumit currently 

## 2021-08-08 ENCOUNTER — Other Ambulatory Visit (HOSPITAL_COMMUNITY): Payer: Self-pay

## 2021-08-09 ENCOUNTER — Encounter: Payer: Self-pay | Admitting: *Deleted

## 2021-08-09 NOTE — Progress Notes (Deleted)
? ? ?Referring Provider: No ref. provider found ?Primary Care Physician:  Lindell Spar, MD ?Primary GI Physician: Dr. [name] ? ?No chief complaint on file. ? ? ?HPI:   ?Karen [name] is a 51 y.o. female presenting today with a history of chronic history of GERD, gastroparesis with abnormal GES in 2019. Most recent EGD in September 2019 with erosive reflux esophagitis, patulous GE junction, no dilation and limited exam due to retained food in stomach.  Colonoscopy up-to-date 01/02/2019 and found normal colon with recommended repeat in 10 years (2030). She is presenting today for follow-up.  ? ?Notably, she has seen [facility] in the past as well.  Last office visit with [facility] was in August 2022.  They recommended strict gastroparesis diet, following step 1-step 3, adjusting her diet based on symptoms for the day.  If persistent issues, could consider EGD with FLIP pyloric Botox. ? ?We last saw her in December 2022.  She continued with intermittent abdominal fullness and upper abdominal cramping, occasional nausea/vomiting if getting too full.  This only occurred 1 or 2 times a month.  Her weight was stable.  Heartburn improving with taking Nexium before meals.  Breakthrough symptoms about once a week when she has significant fullness, usually in the evening when she lays down.  She was eating lunch and dinner and maybe a snack between.  She never try eating 4-6 meals a day.  Reported involuntary movements with Reglan previously.  Also reported chronic history of intermittent constipation.  No alarm symptoms.  She was counseled extensively on gastroparesis diet and provided separate handouts.  Also plan to start Marcus to see if this would help with constipation and provide benefit for entire GI tract motility.  Continued on Nexium twice daily.  Plan to follow-up in 3 months, but requested progress report in 3 to 4 weeks. ? ?Unfortunately, Motegrity was denied.  Offered trying low-dose Linzess, but we were unable  to reach patient. ? ?Today:  ? ? ? ? ?[facility]13 with angiography ? ?Past Medical History:  ?Diagnosis Date  ? Acute respiratory failure with hypoxia due to flash pulmonary edema requiring intubation 02/13/2020  ? Calculus of gallbladder with acute cholecystitis without obstruction   ? CHF (congestive heart failure) ([place])   ? GERD (gastroesophageal reflux disease)   ? Hypertension   ? Hypothyroidism   ? ILD (interstitial lung disease) ([place]) 02/08/2018  ? Interstitial lung disease ([place])   ? Multinodular thyroid   ? benign FNA 08/2017.  ? Perimenopause 02/27/2019  ? Pulmonary edema   ? Scleroderma ([place])   ? Status post laparoscopic cholecystectomy 02/13/20 02/13/2020  ? Vitamin D deficiency disease 02/27/2019  ? ? ?Past Surgical History:  ?Procedure Laterality Date  ? ABDOMINAL HERNIA REPAIR    ? BUNIONECTOMY Bilateral 10/2018  ? CHOLECYSTECTOMY N/A 02/13/2020  ? Procedure: LAPAROSCOPIC CHOLECYSTECTOMY;  Surgeon: Aviva Signs, MD;  Location: [facility];  Service: General;  Laterality: N/A;  ? COLONOSCOPY WITH PROPOFOL N/A 01/02/2019  ? Procedure: COLONOSCOPY WITH PROPOFOL;  Surgeon: Daneil Dolin, MD;  [facility];  Service: Endoscopy;  Laterality: N/A;  12:30pm  ? ESOPHAGOGASTRODUODENOSCOPY (EGD) WITH PROPOFOL N/A 01/28/2018  ? erosive reflux esophagitis, patulous EG Junction, no dilation, incomplete EGD due to retained food in stomach. GES thereafter with delayed gastric emptying.   ? RIGHT HEART CATH N/A 09/27/2017  ? Procedure: RIGHT HEART CATH;  Surgeon: Jolaine Artist, MD;  Location: [place] [facility];  Service: Cardiovascular;  Laterality: N/A;  ? RIGHT HEART CATH N/A 09/05/2019  ? Procedure: RIGHT HEART CATH;  Surgeon: Jolaine Artist, MD;  Location: [place] [facility];  Service: Cardiovascular;  Laterality: N/A;  ? UTERINE FIBROID SURGERY    ? ? ?Current Outpatient Medications  ?Medication Sig Dispense Refill  ? acetaminophen (TYLENOL) 325 MG tablet Take 2 tablets (650 mg total)  by mouth every 6 (six) hours as needed for mild pain, fever or headache (or Fever >/= 101). 12 tablet 0  ? amLODipine (NORVASC) 10 MG tablet Take 1 tablet (10 mg total) by mouth daily. 30 tablet 6  ? azelastine (OPTIVAR) 0.05 % ophthalmic solution Place 1 drop into both eyes daily as needed (allergies).     ? bisoprolol (ZEBETA) 5 MG tablet Take 1 tablet (5 mg total) by mouth daily. NEEDS OFFICE VISIT FOR ANY MORE REFILLS 30 tablet 0  ? clindamycin-benzoyl peroxide (BENZACLIN) gel Apply 1 application topically to the face daily. 50 g 5  ? clobetasol ointment (TEMOVATE) 0.05 % Apply to scalp three times weekly. 45 g 5  ? esomeprazole (NEXIUM) 40 MG capsule Take 1 capsule (40 mg total) by mouth 2 (two) times daily. 180 capsule 1  ? furosemide (LASIX) 40 MG tablet Take 1 tablet (40 mg total) by mouth daily as needed for edema. 30 tablet 6  ? levocetirizine (XYZAL) 5 MG tablet levocetirizine 5 mg tablet ? TAKE 1 TABLET BY MOUTH DAILY (Patient not taking: Reported on 08/04/2021)    ? macitentan (OPSUMIT) 10 MG tablet Take 1 tablet (10 mg total) by mouth daily. 30 tablet 11  ? Multiple Vitamin (MULTIVITAMIN ADULT PO) Take by mouth.    ? mycophenolate (CELLCEPT) 500 MG tablet Take 3 tablets (1,500 mg total) by mouth every morning AND 3 tablets (1,500 mg total) every evening. 540 tablet 1  ? Nintedanib (OFEV) 150 MG CAPS Take 1 capsule (150 mg total) by mouth 2 (two) times daily. 60 capsule 4  ? NP THYROID 120 MG tablet Take 1 tablet (120 mg total) by mouth daily before breakfast. 90 tablet 0  ? potassium chloride 20 MEQ/15ML (10%) SOLN Take 15 mLs (20 mEq total) by mouth daily. NEEDS OFFICE VISIT FOR ANY MORE REFILLS 473 mL 0  ? Prucalopride Succinate (MOTEGRITY) 1 MG TABS Take 1 tablet (1 mg) by mouth daily. (Patient not taking: Reported on 08/04/2021) 60 tablet 1  ? spironolactone (ALDACTONE) 25 MG tablet Take 1 tablet (25 mg total) by mouth 2 (two) times daily. 60 tablet 6  ? tacrolimus (PROTOPIC) 0.1 % ointment Apply to  dry skin on face and body daily to twice daily as needed. 60 g 5  ? triamcinolone ointment (KENALOG) 0.1 % Apply to affected itching areas on body twice daily for 2 weeks, then daily for 2 weeks, then every other day for 2 weeks. Not to face. 454 g 5  ? ?No current facility-administered medications for this visit.  ? ? ?Allergies as of 08/11/2021 - Review Complete 08/04/2021  ?Allergen Reaction Noted  ? Lisinopril Hives, Swelling, and Other (See Comments) 08/13/2015  ? ? ?Family History  ?Problem Relation Age of Onset  ? Hypertension Father   ? Hypertension Sister   ? Hypertension Brother   ? Diabetes Maternal Grandfather   ? Diabetes Maternal Uncle   ? Diabetes Mother   ? Colon cancer Neg Hx   ? ? ?Social History  ? ?Socioeconomic History  ? Marital status: Single  ?  Spouse name: Not on file  ?  Number of children: 0  ? Years of education: Not on file  ? Highest education level: Associate degree: occupational, Hotel manager, or vocational program  ?Occupational History  ? Occupation: CMA  ?  Employer: [employer]  ?Tobacco Use  ? Smoking status: Never  ? Smokeless tobacco: Never  ?Vaping Use  ? Vaping Use: Former  ?Substance and Sexual Activity  ? Alcohol use: No  ? Drug use: No  ? Sexual activity: Yes  ?Other Topics Concern  ? Not on file  ?Social History Narrative  ? Patient is right-handed. 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  ? ?Social Determinants of Health  ? ?Financial Resource Strain: Not on file  ?Food Insecurity: Not on file  ?Transportation Needs: Not on file  ?Physical Activity: Not on file  ?Stress: Not on file  ?Social Connections: Not on file  ? ? ?Review of Systems: ?Gen: Denies fever, chills, cold or flulike symptoms, presyncope, syncope. ?CV: Denies chest pain, palpitations. ?Resp: Denies dyspnea or cough. ?GI: See HPI ?Heme: See HPI ? ?Physical Exam: ?There were no vitals taken for this visit. ?General:   Alert and oriented. No distress noted.  Pleasant and cooperative.  ?Head:  Normocephalic and atraumatic. ?Eyes:  Conjuctiva clear without scleral icterus. ?Heart:  S1, S2 present without murmurs appreciated. ?Lungs:  Clear to auscultation bila

## 2021-08-11 ENCOUNTER — Ambulatory Visit: Payer: 59 | Admitting: Gastroenterology

## 2021-08-12 ENCOUNTER — Other Ambulatory Visit (HOSPITAL_COMMUNITY): Payer: Self-pay

## 2021-08-12 ENCOUNTER — Ambulatory Visit (HOSPITAL_COMMUNITY): Payer: 59 | Attending: Internal Medicine

## 2021-08-16 ENCOUNTER — Other Ambulatory Visit (HOSPITAL_COMMUNITY): Payer: Self-pay

## 2021-08-18 ENCOUNTER — Ambulatory Visit (HOSPITAL_COMMUNITY)
Admission: RE | Admit: 2021-08-18 | Discharge: 2021-08-18 | Disposition: A | Discharge status: Home / Self Care | Payer: 59 | Attending: Internal Medicine | Admitting: Internal Medicine

## 2021-08-18 ENCOUNTER — Encounter (HOSPITAL_COMMUNITY): Payer: Self-pay | Admitting: Internal Medicine

## 2021-08-18 ENCOUNTER — Other Ambulatory Visit: Payer: Self-pay

## 2021-08-18 ENCOUNTER — Encounter (HOSPITAL_COMMUNITY)
Admission: RE | Disposition: A | Discharge status: Home / Self Care | Payer: Self-pay | Source: Home / Self Care | Attending: Internal Medicine

## 2021-08-18 DIAGNOSIS — I272 Pulmonary hypertension, unspecified: Secondary | ICD-10-CM

## 2021-08-18 DIAGNOSIS — I2721 Secondary pulmonary arterial hypertension: Secondary | ICD-10-CM | POA: Diagnosis not present

## 2021-08-18 DIAGNOSIS — I5032 Chronic diastolic (congestive) heart failure: Secondary | ICD-10-CM | POA: Diagnosis not present

## 2021-08-18 DIAGNOSIS — I110 Hypertensive heart disease with heart failure: Secondary | ICD-10-CM | POA: Insufficient documentation

## 2021-08-18 DIAGNOSIS — J8410 Pulmonary fibrosis, unspecified: Secondary | ICD-10-CM | POA: Insufficient documentation

## 2021-08-18 DIAGNOSIS — I2510 Atherosclerotic heart disease of native coronary artery without angina pectoris: Secondary | ICD-10-CM | POA: Diagnosis not present

## 2021-08-18 DIAGNOSIS — M3500 Sicca syndrome, unspecified: Secondary | ICD-10-CM | POA: Insufficient documentation

## 2021-08-18 HISTORY — PX: RIGHT/LEFT HEART CATH AND CORONARY ANGIOGRAPHY: CATH118266

## 2021-08-18 LAB — POCT I-STAT EG7
Acid-Base Excess: 2 mmol/L (ref 0.0–2.0)
Acid-Base Excess: 2 mmol/L (ref 0.0–2.0)
Acid-Base Excess: 0 mmol/L (ref 0.0–2.0)
Bicarbonate: 27.9 mmol/L (ref 20.0–28.0)
Bicarbonate: 28 mmol/L (ref 20.0–28.0)
Bicarbonate: 26.1 mmol/L (ref 20.0–28.0)
Calcium, Ion: 1.44 mmol/L — ABNORMAL HIGH (ref 1.15–1.40)
Calcium, Ion: 1.45 mmol/L — ABNORMAL HIGH (ref 1.15–1.40)
Calcium, Ion: 1.28 mmol/L (ref 1.15–1.40)
HCT: 34 % — ABNORMAL LOW (ref 36.0–46.0)
HCT: 35 % — ABNORMAL LOW (ref 36.0–46.0)
HCT: 31 % — ABNORMAL LOW (ref 36.0–46.0)
Hemoglobin: 11.6 g/dL — ABNORMAL LOW (ref 12.0–15.0)
Hemoglobin: 11.9 g/dL — ABNORMAL LOW (ref 12.0–15.0)
Hemoglobin: 10.5 g/dL — ABNORMAL LOW (ref 12.0–15.0)
O2 Saturation: 75 %
O2 Saturation: 75 %
O2 Saturation: 78 %
Potassium: 3.4 mmol/L — ABNORMAL LOW (ref 3.5–5.1)
Potassium: 3.5 mmol/L (ref 3.5–5.1)
Potassium: 3.2 mmol/L — ABNORMAL LOW (ref 3.5–5.1)
Sodium: 142 mmol/L (ref 135–145)
Sodium: 142 mmol/L (ref 135–145)
Sodium: 143 mmol/L (ref 135–145)
TCO2: 29 mmol/L (ref 22–32)
TCO2: 29 mmol/L (ref 22–32)
TCO2: 28 mmol/L (ref 22–32)
pCO2, Ven: 47.5 mmHg (ref 44–60)
pCO2, Ven: 47.8 mmHg (ref 44–60)
pCO2, Ven: 45.9 mmHg (ref 44–60)
pH, Ven: 7.375 (ref 7.25–7.43)
pH, Ven: 7.376 (ref 7.25–7.43)
pH, Ven: 7.364 (ref 7.25–7.43)
pO2, Ven: 41 mmHg (ref 32–45)
pO2, Ven: 41 mmHg (ref 32–45)
pO2, Ven: 44 mmHg (ref 32–45)

## 2021-08-18 LAB — CBC
HCT: 42.5 % (ref 36.0–46.0)
Hemoglobin: 12.9 g/dL (ref 12.0–15.0)
MCH: 27.5 pg (ref 26.0–34.0)
MCHC: 30.4 g/dL (ref 30.0–36.0)
MCV: 90.6 fL (ref 80.0–100.0)
Platelets: 180 10*3/uL (ref 150–400)
RBC: 4.69 MIL/uL (ref 3.87–5.11)
RDW: 14.3 % (ref 11.5–15.5)
WBC: 6.6 10*3/uL (ref 4.0–10.5)
nRBC: 0 % (ref 0.0–0.2)

## 2021-08-18 LAB — POCT I-STAT 7, (LYTES, BLD GAS, ICA,H+H)
Acid-Base Excess: 0 mmol/L (ref 0.0–2.0)
Bicarbonate: 25.6 mmol/L (ref 20.0–28.0)
Calcium, Ion: 1.34 mmol/L (ref 1.15–1.40)
HCT: 33 % — ABNORMAL LOW (ref 36.0–46.0)
Hemoglobin: 11.2 g/dL — ABNORMAL LOW (ref 12.0–15.0)
O2 Saturation: 96 %
Potassium: 3.4 mmol/L — ABNORMAL LOW (ref 3.5–5.1)
Sodium: 144 mmol/L (ref 135–145)
TCO2: 27 mmol/L (ref 22–32)
pCO2 arterial: 42 mmHg (ref 32–48)
pH, Arterial: 7.392 (ref 7.35–7.45)
pO2, Arterial: 84 mmHg (ref 83–108)

## 2021-08-18 LAB — BASIC METABOLIC PANEL
Anion gap: 7 (ref 5–15)
BUN: 16 mg/dL (ref 6–20)
CO2: 24 mmol/L (ref 22–32)
Calcium: 10.4 mg/dL — ABNORMAL HIGH (ref 8.9–10.3)
Chloride: 109 mmol/L (ref 98–111)
Creatinine, Ser: 0.6 mg/dL (ref 0.44–1.00)
GFR, Estimated: >60 mL/min (ref >60)
Glucose, Bld: 90 mg/dL (ref 70–99)
Potassium: 3.8 mmol/L (ref 3.5–5.1)
Sodium: 140 mmol/L (ref 135–145)

## 2021-08-18 SURGERY — RIGHT/LEFT HEART CATH AND CORONARY ANGIOGRAPHY
Anesthesia: LOCAL

## 2021-08-18 MED ORDER — HEPARIN SODIUM (PORCINE) 1000 UNIT/ML IJ SOLN
INTRAMUSCULAR | Status: DC | PRN
  Administered 2021-08-18: 3000 [IU] via INTRAVENOUS

## 2021-08-18 MED ORDER — ACETAMINOPHEN 325 MG PO TABS
650.0000 mg | ORAL_TABLET | ORAL | Status: DC | PRN
  Administered 2021-08-18: 650 mg via ORAL
  Filled 2021-08-18: qty 2

## 2021-08-18 MED ORDER — ONDANSETRON HCL 4 MG/2ML IJ SOLN: 4.0000 mg | Freq: Four times a day (QID) | INTRAMUSCULAR | Status: DC | PRN

## 2021-08-18 MED ORDER — HYDRALAZINE HCL 20 MG/ML IJ SOLN: 10.0000 mg | INTRAMUSCULAR | Status: DC | PRN

## 2021-08-18 MED ORDER — SODIUM CHLORIDE 0.9% FLUSH: 3.0000 mL | INTRAVENOUS | Status: DC | PRN

## 2021-08-18 MED ORDER — HEPARIN (PORCINE) IN NACL 1000-0.9 UT/500ML-% IV SOLN
INTRAVENOUS | Status: AC
  Filled 2021-08-18: qty 1000

## 2021-08-18 MED ORDER — HEPARIN SODIUM (PORCINE) 1000 UNIT/ML IJ SOLN
INTRAMUSCULAR | Status: AC
  Filled 2021-08-18: qty 10

## 2021-08-18 MED ORDER — SODIUM CHLORIDE 0.9% FLUSH: 3.0000 mL | Freq: Two times a day (BID) | INTRAVENOUS | Status: DC

## 2021-08-18 MED ORDER — SODIUM CHLORIDE 0.9 % IV SOLN: INTRAVENOUS | Status: AC

## 2021-08-18 MED ORDER — MIDAZOLAM HCL 2 MG/2ML IJ SOLN
INTRAMUSCULAR | Status: AC
  Filled 2021-08-18: qty 2

## 2021-08-18 MED ORDER — LIDOCAINE HCL (PF) 1 % IJ SOLN
INTRAMUSCULAR | Status: AC
  Filled 2021-08-18: qty 30

## 2021-08-18 MED ORDER — FENTANYL CITRATE (PF) 100 MCG/2ML IJ SOLN
INTRAMUSCULAR | Status: DC | PRN
  Administered 2021-08-18: 25 ug via INTRAVENOUS

## 2021-08-18 MED ORDER — LIDOCAINE HCL (PF) 1 % IJ SOLN
INTRAMUSCULAR | Status: DC | PRN
Start: 2021-08-18 — End: 2021-08-18
  Administered 2021-08-18 (×2): 2 mL

## 2021-08-18 MED ORDER — FENTANYL CITRATE (PF) 100 MCG/2ML IJ SOLN
INTRAMUSCULAR | Status: AC
  Filled 2021-08-18: qty 2

## 2021-08-18 MED ORDER — HEPARIN (PORCINE) IN NACL 1000-0.9 UT/500ML-% IV SOLN
INTRAVENOUS | Status: DC | PRN
  Administered 2021-08-18 (×2): 500 mL

## 2021-08-18 MED ORDER — VERAPAMIL HCL 2.5 MG/ML IV SOLN
INTRAVENOUS | Status: AC
  Filled 2021-08-18: qty 2

## 2021-08-18 MED ORDER — ASPIRIN 81 MG PO CHEW
81.0000 mg | CHEWABLE_TABLET | ORAL | Status: AC
  Administered 2021-08-18: 81 mg via ORAL
  Filled 2021-08-18: qty 1

## 2021-08-18 MED ORDER — SODIUM CHLORIDE 0.9 % IV SOLN: 250.0000 mL | INTRAVENOUS | Status: DC | PRN

## 2021-08-18 MED ORDER — SODIUM CHLORIDE 0.9 % IV SOLN: INTRAVENOUS | Status: DC

## 2021-08-18 MED ORDER — IOHEXOL 350 MG/ML SOLN
INTRAVENOUS | Status: DC | PRN
  Administered 2021-08-18: 60 mL

## 2021-08-18 MED ORDER — MIDAZOLAM HCL 2 MG/2ML IJ SOLN
INTRAMUSCULAR | Status: DC | PRN
  Administered 2021-08-18: 2 mg via INTRAVENOUS

## 2021-08-18 MED ORDER — VERAPAMIL HCL 2.5 MG/ML IV SOLN
INTRAVENOUS | Status: DC | PRN
  Administered 2021-08-18: 10 mL via INTRA_ARTERIAL

## 2021-08-18 MED ORDER — LABETALOL HCL 5 MG/ML IV SOLN: 10.0000 mg | INTRAVENOUS | Status: DC | PRN

## 2021-08-18 SURGICAL SUPPLY — 16 items
BAND ZEPHYR COMPRESS 30 LONG (HEMOSTASIS) ×1 IMPLANT
CATH 5FR JL3.5 JR4 ANG PIG MP (CATHETERS) ×1 IMPLANT
CATH BALLN WEDGE 5F 110CM (CATHETERS) ×1 IMPLANT
CATH LAUNCHER 5F JL3 (CATHETERS) IMPLANT
CATHETER LAUNCHER 5F JL3 (CATHETERS) ×2
GLIDESHEATH SLEND SS 6F .021 (SHEATH) ×1 IMPLANT
GUIDEWIRE .025 260CM (WIRE) ×1 IMPLANT
GUIDEWIRE INQWIRE 1.5J.035X260 (WIRE) IMPLANT
INQWIRE 1.5J .035X260CM (WIRE) ×2
KIT HEART LEFT (KITS) ×1 IMPLANT
KIT MICROPUNCTURE NIT STIFF (SHEATH) ×1 IMPLANT
PACK CARDIAC CATHETERIZATION (CUSTOM PROCEDURE TRAY) ×2 IMPLANT
SHEATH GLIDE SLENDER 4/5FR (SHEATH) ×1 IMPLANT
SHEATH PROBE COVER 6X72 (BAG) ×1 IMPLANT
TRANSDUCER W/STOPCOCK (MISCELLANEOUS) ×2 IMPLANT
WIRE MICROINTRODUCER 60CM (WIRE) ×2 IMPLANT

## 2021-08-18 NOTE — Interval H&P Note (Signed)
History and Physical Interval Note: ? ?08/18/2021 ?9:52 AM ? ?Karen [name]  has presented today for surgery, with the diagnosis of pulmonary htn, heart failure.  The various methods of treatment have been discussed with the patient and family. After consideration of risks, benefits and other options for treatment, the patient has consented to  Procedure(s): ?RIGHT/LEFT HEART CATH AND CORONARY ANGIOGRAPHY (N/A) and possible coronary angioplasty as a surgical intervention.  The patient's history has been reviewed, patient examined, no change in status, stable for surgery.  I have reviewed the patient's chart and labs.  Questions were answered to the patient's satisfaction.   ? ? ?Chia Mowers ? ? ?

## 2021-08-18 NOTE — Progress Notes (Signed)
TR Band removed and patient began to bleed from R Radial. Pressure applied for 30 minutes. Bleeding stopped and dressing applied. No abnormalities noted. Will keep patient for 30 minutes to monitor.  ?

## 2021-08-19 ENCOUNTER — Ambulatory Visit (HOSPITAL_COMMUNITY)
Admission: RE | Admit: 2021-08-19 | Discharge: 2021-08-19 | Disposition: A | Discharge status: Home / Self Care | Payer: 59 | Source: Ambulatory Visit | Attending: Internal Medicine | Admitting: Internal Medicine

## 2021-08-19 ENCOUNTER — Telehealth (HOSPITAL_COMMUNITY): Payer: Self-pay | Admitting: *Deleted

## 2021-08-19 ENCOUNTER — Encounter (HOSPITAL_COMMUNITY): Payer: Self-pay | Admitting: *Deleted

## 2021-08-19 ENCOUNTER — Other Ambulatory Visit (HOSPITAL_COMMUNITY): Payer: Self-pay | Admitting: *Deleted

## 2021-08-19 DIAGNOSIS — M79601 Pain in right arm: Secondary | ICD-10-CM | POA: Diagnosis not present

## 2021-08-19 NOTE — Progress Notes (Signed)
Forms for intermittent FMLA completed, signed by Dr Haroldine Laws, and faxed to [employer] at [phone] ?

## 2021-08-19 NOTE — Progress Notes (Addendum)
Right upper extremity arterial duplex completed. ?Refer to "CV Proc" under chart review to view preliminary results. ? ?Preliminary results discussed with Dr. Haroldine Laws. ? ?08/19/2021 3:59 PM ?Kelby Aline., MHA, RVT, RDCS, RDMS   ?

## 2021-08-19 NOTE — Telephone Encounter (Signed)
Pt called c/o right arm pain from cath site up to her shoulder. Pt had a heart cath yesterday. Pt denies fever or warmth of arm. Pt does notice swelling. Per Dr.Bensimhon order VAS [place] UPPER EXTREMITY ARTERIAL DUPLEX . ? ?Pt aware stat order placed.  ? ?

## 2021-08-24 NOTE — Progress Notes (Signed)
? ?GI Office Note   ? ?Referring Provider: Lindell Spar, MD ?Primary Care Physician:  Lindell Spar, MD ?Primary GI: Dr. [name] ? ?Date:  08/25/2021  ?ID:  Karen [name], DOB 01/24/1971, MRN [id number] ? ? ?Chief Complaint  ? ?Chief Complaint  ?Patient presents with  ? Follow-up  ?  No current issues to discuss.   ? ? ? ?History of Present Illness  ?Karen [name] is a 51 y.o. female with a history of CHF, HTN, Hypothyroid, ILD, scleroderma, vitamin D deficiency, GERD, gastroparesis with abnormal GES in 2019.  Last EGD September 2019 with erosive reflux esophagitis, patulous GE junction, no dilation and limited exam due to retained food in the stomach.  Colonoscopy 01/02/2019 was normal, repeat in 10 years (2030). Presenting today for gastroparesis and GERD follow up.  She also follows with [facility].  ? ? ?Last [facility] visit 12/23/20 - continued upper abdominal pain, abdominal fullness, associated N/V and heartburn more so at night. On Nexium '40mg'$  BID and remained off Reglan. Dietary counseling provided again with step up diet. If she followed diet management and symptoms persisted then consideration of EGD with pyloric botox is an option.  ? ?Last seen in our office 04/07/21 -she continues to have intermittent abdominal fullness and upper abdominal cramping, occasional nausea or vomiting, her weight was stable.  Heartburn was improving as long as she was taking Nexium before meals rather than after meals.  She was having about 1 episode of breakthrough symptoms a week usually in the evening when lying down.  She was not following recommendations of 4-6 small meals a day, was eating lunch and dinner and maybe a snack in between.  Has reported some involuntary movements in the past with Reglan.  She also continues to complain of some chronic intermittent constipation with very rare diarrhea, occasional laxative use.  She was counseled on gastroparesis diet and given separate written instructions and 2 handouts,  advised to continue Nexium 40 mg twice daily, GERD diet lifestyle modifications reinforced, she was also prescribed Motegrity 1 mg daily to help with constipation and gastroparesis however PA was denied, letter sent to patient and was advised to call that she felt like she needed prescription for constipation that we could try her on Linzess or she could use over-the-counter Colace as needed. ? ?Today: ?Gastroparesis -follow-up with [facility] - next month, he felt like the last MD that she saw there was too quiet for her did not provide her with much information.  She continues to have abdominal fullness that is intermittent, she reports that she tried eating 6 smaller meals but that this made her feel fuller so now she is sometimes eating breakfast, but mostly eats lunch and dinner with a snack but has cut back on her portions she has had no worsening symptoms, however no significant improvement either.  She denies any recent N/V. No specific foods make her fuller than others.  Her diet consist of mostly chicken and vegetables. Salads for lunch. Fruits - apples, oranges, grapes sometimes, however not frequent.she reports worsening abdominal fullness if she eats to close to going to bed, she will wake up feeling bad the next morning. Since she cut back on meals she hasn't been feeling as bad. Weight has been stable.  ? ?GERD - on Nexium '40mg'$  BID, started taking it like she should since her last office visit and it has been much better. Last meal usually symptoms around 6-7, goes to bed 7:30 or 8. Tries  to eat 2-3 hours before bed but due to her schedule is not always feasible. No Tums or breakthrough symptoms. Occasional issues swallowing, same as always been, no change. Has been trying to chew food better. Cuts chicken into small pieces. Is alternating sips of liquids. ? ?Constipation - improved from prior. Most of the time she goes every day, sometimes 1 or 2 times a day. Sometimes large amounts, sometimes small  amounts. Likely dependent on what she eats. Sometimes strains but does not often.  Occasional laxatives if needed, colace or women's gentle laxative.  Denies any lower abdominal pain.  ? 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 She works 10 hour shifts. ? ?Past Medical History:  ?Diagnosis Date  ? Acute respiratory failure with hypoxia due to flash pulmonary edema requiring intubation 02/13/2020  ? Calculus of gallbladder with acute cholecystitis without obstruction   ? CHF (congestive heart failure) ([place])   ? GERD (gastroesophageal reflux disease)   ? Hypertension   ? Hypothyroidism   ? ILD (interstitial lung disease) ([place]) 02/08/2018  ? Interstitial lung disease ([place])   ? Multinodular thyroid   ? benign FNA 08/2017.  ? Perimenopause 02/27/2019  ? Pulmonary edema   ? Scleroderma ([place])   ? Status post laparoscopic cholecystectomy 02/13/20 02/13/2020  ? Vitamin D deficiency disease 02/27/2019  ? ? ?Past Surgical History:  ?Procedure Laterality Date  ? ABDOMINAL HERNIA REPAIR    ? BUNIONECTOMY Bilateral 10/2018  ? CHOLECYSTECTOMY N/A 02/13/2020  ? Procedure: LAPAROSCOPIC CHOLECYSTECTOMY;  Surgeon: Aviva Signs, MD;  Location: [facility];  Service: General;  Laterality: N/A;  ? COLONOSCOPY WITH PROPOFOL N/A 01/02/2019  ? Procedure: COLONOSCOPY WITH PROPOFOL;  Surgeon: Daneil Dolin, MD;  Location: [facility];  Service: Endoscopy;  Laterality: N/A;  12:30pm  ? ESOPHAGOGASTRODUODENOSCOPY (EGD) WITH PROPOFOL N/A 01/28/2018  ? erosive reflux esophagitis, patulous EG Junction, no dilation, incomplete EGD due to retained food in stomach. GES thereafter with delayed gastric emptying.   ? RIGHT HEART CATH N/A 09/27/2017  ? Procedure: RIGHT HEART CATH;  Surgeon: Jolaine Artist, MD;  Location: [place] [facility];  Service: Cardiovascular;  Laterality: N/A;  ? RIGHT HEART CATH N/A 09/05/2019  ? Procedure: RIGHT HEART CATH;  Surgeon: Jolaine Artist, MD;  Location: [place] [facility];   Service: Cardiovascular;  Laterality: N/A;  ? RIGHT/LEFT HEART CATH AND CORONARY ANGIOGRAPHY N/A 08/18/2021  ? Procedure: RIGHT/LEFT HEART CATH AND CORONARY ANGIOGRAPHY;  Surgeon: Jolaine Artist, MD;  Location: [place] [facility];  Service: Cardiovascular;  Laterality: N/A;  ? UTERINE FIBROID SURGERY    ? ? ?Current Outpatient Medications  ?Medication Sig Dispense Refill  ? acetaminophen (TYLENOL) 325 MG tablet Take 2 tablets (650 mg total) by mouth every 6 (six) hours as needed for mild pain, fever or headache (or Fever >/= 101). 12 tablet 0  ? amLODipine (NORVASC) 10 MG tablet Take 1 tablet (10 mg total) by mouth daily. 30 tablet 6  ? azelastine (OPTIVAR) 0.05 % ophthalmic solution Place 1 drop into both eyes daily as needed (allergies).     ? bisoprolol (ZEBETA) 5 MG tablet Take 1 tablet (5 mg total) by mouth daily. 30 tablet 0  ? clindamycin-benzoyl peroxide (BENZACLIN) gel Apply 1 application topically to the face daily. 50 g 5  ? clobetasol ointment (TEMOVATE) 0.05 % Apply to scalp three times weekly. 45 g 5  ? esomeprazole (NEXIUM) 40 MG capsule Take 1 capsule (  40 mg total) by mouth 2 (two) times daily. 180 capsule 1  ? furosemide (LASIX) 40 MG tablet Take 1 tablet (40 mg total) by mouth daily as needed for edema. 30 tablet 6  ? macitentan (OPSUMIT) 10 MG tablet Take 1 tablet (10 mg total) by mouth daily. 30 tablet 11  ? Multiple Vitamin (MULTIVITAMIN ADULT PO) Take by mouth.    ? mycophenolate (CELLCEPT) 500 MG tablet Take 3 tablets (1,500 mg total) by mouth every morning AND 3 tablets (1,500 mg total) every evening. 540 tablet 1  ? Nintedanib (OFEV) 150 MG CAPS Take 1 capsule (150 mg total) by mouth 2 (two) times daily. 60 capsule 4  ? NP THYROID 120 MG tablet Take 1 tablet (120 mg total) by mouth daily before breakfast. 90 tablet 0  ? potassium chloride 20 MEQ/15ML (10%) SOLN Take 15 mLs (20 mEq total) by mouth daily. NEEDS OFFICE VISIT FOR ANY MORE REFILLS 473 mL 0  ? spironolactone (ALDACTONE) 25 MG  tablet Take 1 tablet (25 mg total) by mouth 2 (two) times daily. 60 tablet 6  ? tacrolimus (PROTOPIC) 0.1 % ointment Apply to dry skin on face and body daily to twice daily as needed. 60 g 5  ? triamcinolone ointment

## 2021-08-25 ENCOUNTER — Ambulatory Visit (HOSPITAL_COMMUNITY)
Admission: RE | Admit: 2021-08-25 | Discharge: 2021-08-25 | Disposition: A | Discharge status: Home / Self Care | Payer: 59 | Source: Ambulatory Visit | Attending: Internal Medicine | Admitting: Internal Medicine

## 2021-08-25 ENCOUNTER — Ambulatory Visit: Payer: 59 | Admitting: Gastroenterology

## 2021-08-25 ENCOUNTER — Encounter: Payer: Self-pay | Admitting: Gastroenterology

## 2021-08-25 VITALS — BP 130/80 | HR 88 | Temp 97.8°F | Ht 64.0 in | Wt 136.4 lb

## 2021-08-25 DIAGNOSIS — K3184 Gastroparesis: Secondary | ICD-10-CM

## 2021-08-25 DIAGNOSIS — E042 Nontoxic multinodular goiter: Secondary | ICD-10-CM | POA: Diagnosis not present

## 2021-08-25 DIAGNOSIS — E041 Nontoxic single thyroid nodule: Secondary | ICD-10-CM | POA: Diagnosis not present

## 2021-08-25 DIAGNOSIS — K5904 Chronic idiopathic constipation: Secondary | ICD-10-CM

## 2021-08-25 DIAGNOSIS — K219 Gastro-esophageal reflux disease without esophagitis: Secondary | ICD-10-CM

## 2021-08-25 NOTE — Patient Instructions (Addendum)
It was a pleasure to meet you today!  ? ?Continue GERD diet and lifestyle modifications. Continue Nexium twice daily.  ?Avoid fried, fatty, greasy, spicy, citrus foods. ?Avoid caffeine and carbonated beverages. ?Avoid chocolate. ?Do not eat within 2-3 hours of laying down. ?Prop head of bed up to create a 6 inch incline. ? ?Continue to follow gastroparesis diet as tolerated ?Okay to continue your smaller 3 meals a day and snacks as long as symptoms are controlled ?Low fat/low fiber diet, soluble fiber is okay (this will help with constipation) ?Avoid raw fruits and vegetables (cooked is okay) ? ?Continue OTC laxatives as needed for control of constipation.  ? ?Will have you follow up in 4-6 months or sooner if symptoms worsen or new issues arise. Proceed with [facility] follow up for further treatment options.  ? ?It was a pleasure to see you today. I want to create trusting relationships with patients. If you receive a survey regarding your visit,  I greatly appreciate you taking time to fill this out on paper or through your MyChart. I value your feedback. ? ?Venetia Night, MSN, FNP-BC, AGACNP-BC ?[facility] ? ? ? ?  ?

## 2021-08-30 ENCOUNTER — Other Ambulatory Visit (HOSPITAL_COMMUNITY): Payer: Self-pay

## 2021-09-01 ENCOUNTER — Telehealth: Payer: Self-pay | Admitting: Internal Medicine

## 2021-09-01 NOTE — Telephone Encounter (Signed)
Return call for ultrasound results  ?

## 2021-09-01 NOTE — Telephone Encounter (Signed)
Pt notified of [place] results with verbal understanding ?

## 2021-09-08 DIAGNOSIS — J343 Hypertrophy of nasal turbinates: Secondary | ICD-10-CM | POA: Diagnosis not present

## 2021-09-08 DIAGNOSIS — H6123 Impacted cerumen, bilateral: Secondary | ICD-10-CM | POA: Insufficient documentation

## 2021-09-08 DIAGNOSIS — R519 Headache, unspecified: Secondary | ICD-10-CM | POA: Insufficient documentation

## 2021-09-08 DIAGNOSIS — R2689 Other abnormalities of gait and mobility: Secondary | ICD-10-CM | POA: Insufficient documentation

## 2021-09-08 DIAGNOSIS — H9313 Tinnitus, bilateral: Secondary | ICD-10-CM | POA: Diagnosis not present

## 2021-09-08 DIAGNOSIS — G8929 Other chronic pain: Secondary | ICD-10-CM | POA: Insufficient documentation

## 2021-09-08 HISTORY — DX: Headache, unspecified: R51.9

## 2021-09-08 HISTORY — DX: Other chronic pain: G89.29

## 2021-09-21 DIAGNOSIS — I272 Pulmonary hypertension, unspecified: Secondary | ICD-10-CM | POA: Diagnosis not present

## 2021-09-21 DIAGNOSIS — J849 Interstitial pulmonary disease, unspecified: Secondary | ICD-10-CM | POA: Diagnosis not present

## 2021-09-21 DIAGNOSIS — K30 Functional dyspepsia: Secondary | ICD-10-CM | POA: Diagnosis not present

## 2021-09-21 DIAGNOSIS — J479 Bronchiectasis, uncomplicated: Secondary | ICD-10-CM | POA: Diagnosis not present

## 2021-09-21 DIAGNOSIS — K224 Dyskinesia of esophagus: Secondary | ICD-10-CM | POA: Diagnosis not present

## 2021-09-21 DIAGNOSIS — Z7682 Awaiting organ transplant status: Secondary | ICD-10-CM | POA: Diagnosis not present

## 2021-09-21 DIAGNOSIS — Z79899 Other long term (current) drug therapy: Secondary | ICD-10-CM | POA: Diagnosis not present

## 2021-09-26 ENCOUNTER — Other Ambulatory Visit (HOSPITAL_COMMUNITY): Payer: Self-pay | Admitting: Internal Medicine

## 2021-09-26 ENCOUNTER — Other Ambulatory Visit (HOSPITAL_COMMUNITY): Payer: Self-pay

## 2021-09-27 ENCOUNTER — Other Ambulatory Visit (HOSPITAL_COMMUNITY): Payer: Self-pay

## 2021-09-27 MED ORDER — BISOPROLOL FUMARATE 5 MG PO TABS
5.0000 mg | ORAL_TABLET | Freq: Every day | ORAL | 3 refills | Status: DC
  Filled 2021-09-27: qty 90, 90d supply, fill #0
  Filled 2022-01-17: qty 90, 90d supply, fill #1
  Filled 2022-06-02: qty 90, 90d supply, fill #2

## 2021-09-29 ENCOUNTER — Ambulatory Visit (INDEPENDENT_AMBULATORY_CARE_PROVIDER_SITE_OTHER): Payer: 59 | Admitting: Pulmonary Disease

## 2021-09-29 ENCOUNTER — Encounter: Payer: Self-pay | Admitting: Pulmonary Disease

## 2021-09-29 VITALS — BP 138/80 | HR 66 | Temp 98.4°F | Ht 65.0 in | Wt 137.4 lb

## 2021-09-29 DIAGNOSIS — J849 Interstitial pulmonary disease, unspecified: Secondary | ICD-10-CM

## 2021-09-29 DIAGNOSIS — M349 Systemic sclerosis, unspecified: Secondary | ICD-10-CM

## 2021-09-29 DIAGNOSIS — Z5181 Encounter for therapeutic drug level monitoring: Secondary | ICD-10-CM | POA: Diagnosis not present

## 2021-09-29 NOTE — Patient Instructions (Addendum)
We will schedule you for high-resolution CT Refer you for pulmonary rehab in [place] Take your potassium supplementation for 3 days  Follow up in 3-4 months

## 2021-09-29 NOTE — Progress Notes (Signed)
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 Haroldine Laws on 09/27/17 with no evidence of pulmonary hypertension Started on CellCept May 2019 and uptitrated to max dose of 1.5 mg twice daily.  She had a hospitalization in [facility] in March 2019 for hypertension.  A CT chest scan at that time showed subtle lower lung findings of atelectasis, groundglass.  She has a right thyroid nodule which was biopsied on 08/15/17 with pathology showing benign findings.  Thyroid function tests are normal.  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 Finished pulmonary rehab in 2021 and is back at work full-time Repeat right heart cath 2021 by Dr. Haroldine Laws showed mild pulmonary hypertension she has been started on  macitetan  Reviewed note from Dr. Trudie Reed, rheumatology 07/12/2017 Seen for joint pain, Raynaud's, elevated CK, skin thickening Serologies positive for Sjogren's negative,, normal CK and aldolase.  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  No mold. No hot tub, Jacuzzi. She has down comforter.  Smoking history: Never smoker Travel history: Grew up in [place].  Lived in [place] and [place] Relevant family history: No family history of lung disease.    Interim history: Continues on CellCept 1.5 mg twice  daily and Ofev On macitentan for pulmonary hypertension per Dr. Haroldine Laws States that breathing is stable with no issues  Has annual follow-up at [facility] transplant program last month when she las pulmonary function test that show a decline in DLCO from 39% to 35%. Reports worse exercise capacity and more fatigue  She also had a repeat [facility] in April 2023 which showed very mild PAH.  Outpatient Encounter Medications as of 09/29/2021  Medication Sig   acetaminophen (TYLENOL) 325 MG tablet Take 2 tablets (650 mg total) by mouth every 6 (six) hours as needed for mild pain, fever or headache (or Fever >/= 101).   amLODipine (NORVASC) 10 MG tablet Take 1 tablet (10 mg total) by mouth daily.   azelastine (OPTIVAR) 0.05 % ophthalmic solution Place 1 drop into both eyes daily as needed (allergies).    bisoprolol (ZEBETA) 5 MG tablet Take 1 tablet (5 mg total) by mouth daily.   clindamycin-benzoyl peroxide (BENZACLIN) gel Apply 1 application topically to the face daily.   clobetasol ointment (TEMOVATE) 0.05 % Apply to scalp three times weekly.   esomeprazole (NEXIUM) 40 MG capsule Take 1 capsule (40 mg total) by mouth 2 (two) times daily.   furosemide (LASIX) 40 MG tablet Take 1 tablet (40 mg total) by mouth daily as needed for edema.   macitentan (OPSUMIT) 10 MG tablet Take 1 tablet (10 mg total) by mouth daily.   Multiple Vitamin (MULTIVITAMIN ADULT PO) Take by mouth.   mycophenolate (CELLCEPT) 500 MG tablet Take 3 tablets (1,500 mg total) by mouth every morning AND 3 tablets (1,500 mg total) every evening.   Nintedanib (OFEV) 150 MG  CAPS Take 1 capsule (150 mg total) by mouth 2 (two) times daily.   NP THYROID 120 MG tablet Take 1 tablet (120 mg total) by mouth daily before breakfast.   potassium chloride 20 MEQ/15ML (10%) SOLN Take 15 mLs (20 mEq total) by mouth daily. NEEDS OFFICE VISIT FOR ANY MORE REFILLS   spironolactone (ALDACTONE) 25 MG tablet Take 1 tablet (25 mg total) by mouth 2 (two) times  daily.   tacrolimus (PROTOPIC) 0.1 % ointment Apply to dry skin on face and body daily to twice daily as needed.   triamcinolone ointment (KENALOG) 0.1 % Apply to affected itching areas on body twice daily for 2 weeks, then daily for 2 weeks, then every other day for 2 weeks. Not to face.   [DISCONTINUED] PARoxetine (PAXIL) 10 MG tablet Take 1 tablet (10 mg total) by mouth daily as directed   No facility-administered encounter medications on file as of 09/29/2021.    Physical Exam: Blood pressure 138/80, pulse 66, temperature 98.4 F (36.9 C), temperature source Oral, height '5\' 5"'$  (1.651 m), weight 137 lb 6.4 oz (62.3 kg), SpO2 95 %. Gen:      No acute distress HEENT:  EOMI, sclera anicteric Neck:     No masses; no thyromegaly Lungs:    Clear to auscultation bilaterally; normal respiratory effort CV:         Regular rate and rhythm; no murmurs Abd:      + bowel sounds; soft, non-tender; no palpable masses, no distension Ext:    No edema; adequate peripheral perfusion Skin:      Warm and dry; no rash Neuro: alert and oriented x 3 Psych: normal mood and affect   Data Reviewed: Imaging CT chest, [facility] 07/24/2017 Dependent atelectasis, subtle groundglass opacities at the base.  2.5 cm right thyroid nodule.  I have reviewed the images personally  CT high-resolution 09/18/2017- patchy reticular groundglass opacities, reticulation with subpleural sparing.  Mild traction bronchiectasis.  No honeycombing.  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  Aortic atherosclerosis, three-vessel coronary artery disease.  CT high-resolution 06/27/2018-stable interstitial lung disease.  CT high-resolution 06/11/2019-stable interstitial lung disease  CT high-resolution 05/19/2020-stable interstitial lung disease I have reviewed the images personally.  PFTs FENO 09/06/2017-unable to complete  07/27/2017 FVC 1.65 (54%), FEV1  1.53 (62%], F/F 93, TLC 50, DLCO 44%, DLCO/VA 131%  10/25/2017 FVC 1.51 [50%], FEV1 1.26 [51%], F/F 83  01/29/2018 FVC 1.69 [5%), FEV1 1.50 [61%], F/F 89, DLCO 10.57 [41%]  07/01/2018 FVC 1.57 [52%), FEV1 1.47 [60%], F/F 93, DLCO 12.54 [59%)  01/06/2019 FVC 1.58 [52%], FEV1 1.46 [60%], F/F 92, TLC 2.62 [50%], DLCO 9.16 [42%] Severe restriction and diffusion impairment.  FVC is stable but DLCO has worsened.  6-minute walk  10/23/2017- 144 m Post walk heart rate, stats 94, 91%  02/04/2018- 249 m Post walk heart rate, sats 101, 99%  06/06/2018-212 m Post walk heart rate, sats 86, 92%  01/30/2019-182mPost walk heart rate, sats 83, 98%  01/13/21-238 m  Labs 08/20/2017-ANA, centromere, rheumatoid factor, SCL 70-negative QuantiFERON 09/12/2017-negative Hepatitis B, C screening 09/21/2017-negative G6PD 09/25/2017-13  Labs from Dr. HTrudie Reed3/7/19 Rheumatoid factor < 10, CCP-8, ANCA-negative, double-stranded DNA-negative, Smith antibody-negative, SCL 70-negative, RNP-negative SSA 5.8, SSB 1.5 Smith antibody-negative, ANA IFA-negative Myositis panel-negative CK 165, C4-30, C3-168, total complement greater than 60, aldolase 8.8 Sed rate 9, CRP 0.9  CBC, hepatic panel 09/16/2020-within normal limits  Cardiac Cardiac cath 09/27/17 PA =  32/10 (19) PCW = 6 Fick cardiac output/index = 5.0/3.0 PVR = 2.6  Cardiac cath 09/05/2019 PA = 38/16 (25) PCW = 6 Fick cardiac output/index = 5.5/3.4 PVR = 3.4 WU Mild pulmonary hypertension  Overnight oximetry 01/30/2019- test time 8 hours 25 minutes Lowest O2 sat 87%.  Mean O2 sat 95%.  Time spent less than 88% - 1 minute 30 seconds No significant desaturation.  Does not need supplemental oxygen.  Assessment:  Scleroderma ILD Continue on CellCept 1.5 g twice daily, Bactrim prophylaxis and ofev  Completed pulmonary rehab but will need a referral again as she is more deconditioned. Follow-up at [facility] transplant and recent labs at [facility] show normal  liver function and blood counts Her K levels were slightly low at [facility] and she will take her prescribed potassium supplementation for 3 days  She is concerned about the decline in DLCO and we will get a CT chest for further evaluation  Hypercalcemia She has mild hypercalcemia of unclear etiology.  She had PTH and vitamin D levels checked by her primary care which are within normal limits She has stopped taking vitamin D supplement.  Continue monitoring.  Mild pulmonary hypertension On macitentan.  Follows with Dr. Haroldine Laws [facility] earlier this year with very mild pulm HTN  Esophageal dysfunction, esophageal stricture, GERD Continue on Protonix 20 mg twice daily Follows with [facility] gastroenterology  Plan/Recommendations: - Continue CellCept, Ofev - Bactrim prophylaxis. - HRCT - Referral to pulmonary rehab - Potassium supplementation  Marshell Garfinkel MD [facility] 09/29/2021, 3:09 PM

## 2021-09-30 ENCOUNTER — Telehealth (HOSPITAL_COMMUNITY): Payer: Self-pay | Admitting: Pharmacy Technician

## 2021-09-30 NOTE — Telephone Encounter (Signed)
Advanced Heart Failure Patient Advocate Encounter  Prior Authorization for [employer] has been submitted and approved.    PA# [id number] Effective dates: 09/30/21 through 09/30/22  Charlann Boxer, CPhT

## 2021-10-13 ENCOUNTER — Other Ambulatory Visit: Payer: Self-pay | Admitting: *Deleted

## 2021-10-13 ENCOUNTER — Other Ambulatory Visit (HOSPITAL_COMMUNITY): Payer: Self-pay

## 2021-10-13 DIAGNOSIS — J849 Interstitial pulmonary disease, unspecified: Secondary | ICD-10-CM

## 2021-10-13 MED ORDER — PREDNISONE 10 MG (21) PO TBPK
ORAL_TABLET | ORAL | 0 refills | Status: DC
  Filled 2021-10-13: qty 21, 6d supply, fill #0

## 2021-10-18 ENCOUNTER — Encounter (HOSPITAL_COMMUNITY): Payer: Self-pay

## 2021-10-21 ENCOUNTER — Other Ambulatory Visit (HOSPITAL_COMMUNITY): Payer: Self-pay

## 2021-10-27 ENCOUNTER — Telehealth (HOSPITAL_COMMUNITY): Payer: Self-pay | Admitting: *Deleted

## 2021-11-01 ENCOUNTER — Telehealth (HOSPITAL_COMMUNITY): Payer: Self-pay | Admitting: *Deleted

## 2021-11-03 ENCOUNTER — Encounter (HOSPITAL_COMMUNITY): Payer: Self-pay | Admitting: Internal Medicine

## 2021-11-03 ENCOUNTER — Ambulatory Visit (HOSPITAL_COMMUNITY)
Admission: RE | Admit: 2021-11-03 | Discharge: 2021-11-03 | Disposition: A | Discharge status: Home / Self Care | Payer: 59 | Source: Ambulatory Visit | Attending: Internal Medicine | Admitting: Internal Medicine

## 2021-11-03 VITALS — BP 120/82 | HR 72 | Wt 136.2 lb

## 2021-11-03 DIAGNOSIS — J8410 Pulmonary fibrosis, unspecified: Secondary | ICD-10-CM | POA: Insufficient documentation

## 2021-11-03 DIAGNOSIS — I272 Pulmonary hypertension, unspecified: Secondary | ICD-10-CM | POA: Diagnosis not present

## 2021-11-03 DIAGNOSIS — Z8249 Family history of ischemic heart disease and other diseases of the circulatory system: Secondary | ICD-10-CM | POA: Diagnosis not present

## 2021-11-03 DIAGNOSIS — Z79624 Long term (current) use of inhibitors of nucleotide synthesis: Secondary | ICD-10-CM | POA: Diagnosis not present

## 2021-11-03 DIAGNOSIS — I10 Essential (primary) hypertension: Secondary | ICD-10-CM

## 2021-11-03 DIAGNOSIS — M349 Systemic sclerosis, unspecified: Secondary | ICD-10-CM | POA: Diagnosis not present

## 2021-11-03 DIAGNOSIS — M3500 Sicca syndrome, unspecified: Secondary | ICD-10-CM | POA: Diagnosis not present

## 2021-11-03 DIAGNOSIS — I5032 Chronic diastolic (congestive) heart failure: Secondary | ICD-10-CM | POA: Diagnosis not present

## 2021-11-03 DIAGNOSIS — I110 Hypertensive heart disease with heart failure: Secondary | ICD-10-CM | POA: Diagnosis not present

## 2021-11-03 NOTE — Progress Notes (Signed)
Heart Failure Clinic Note  Date:  11/03/2021   ID:  Karen [name], DOB 30-Oct-1970, MRN [id number]  Location: Home  Provider location: [facility] Type of Visit: Established patient  PCP:  Lindell Spar, MD  Cardiologist:  Mertie Moores, MD Primary HF: Karen [name]  Chief Complaint: Heart Failure follow-up   History of Present Illness:  HPI: Karen [name] is a 51 y.o. female [place] at [facility] (with [name]) with a history of Sjogren's syndrome, multinodular goiter, HTN and scleroderma referred by Dr. Gavin Pound for evaluation of pulmonary HTN in the setting of scleroderma.   RHC in 5/19 with minimally elevated pressures and hi-res CT which showed ILD. Follows with Dr. Vaughan Browner . F/u CT in 2/20 showed stable ILD   We saw her in 9/20 markedly SOB and ReDS very high @ 49%. CXR with mild pulmonary vascular congestion. ESR normal. Started lasix 40 daily and kcl 20 daily. Fluid got much better. Then switched to lasix 40 mg MWF but that wasn't enough so lasix increased to 5 days per week.   [place] 4/21  RA = 1 RV = 37/7 PA = 38/16 (25) PCW = 6 Fick cardiac output/index = 5.5/3.4 PVR = 3.4 WU Ao sat = 97% PA sat = 74%, 74% High SVC = 70%  Started macitentan in May 2021    Echo 07/21/21 EF 60-65% RV ok Personally reviewed  Repeat cath 4/23 for worsening CP/SOB  Ao = 118/82 (100) LV = 115/9 RA = 3 RV = 35/4 PA = 36/14 (23) PCW = 3 Fick cardiac output/index = 7.3/4.4 PVR = 2.7 Ao sat = 96%  PA sat = 73%, 74% High SVC sat = 78%   Assessment: 1. Very mild non-obstructive CAD (LAD 20%, OM-1 20%) 2. Very mild PAH (no change from previous) with normal cardiac output 3. LVEF 60-65%  Returns for f/u. Doing well. 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 Stable dyspnea. Occasional edema. No CP. BP well controlled. Compliant with meds.    Studies:    Echo 10/21 EF 65-70% RV ok  Hi-res CT 2/21 and 1/22: Stable  ILD  Echo 11/18/18: EF hyperdynamic 65-70% RV normal    Echo 5/19: EF 60-65% grade I DD RV normal. Mild TR.    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  Aortic atherosclerosis, three-vessel coronary artery disease.   CT high-resolution 06/27/2018-stable interstitial lung disease. I reviewed the images personally.  Hi res CT 02/05/19: stable ILD    PFTs  FENO 09/06/2017-unable to complete  07/27/2017 FVC 1.65 (54%), FEV1 1.53 (62%], F/F 93, TLC 50, DLCO 44%, DLCO/VA 131% 10/25/2017 FVC 1.51 [50%], FEV1 1.26 [51%], F/F 83 01/29/2018 FVC 1.69 [5%), FEV1 1.50 [61%], F/F 89, DLCO 10.57 [41%] 01/06/2019 FVC 1.58 [52%], FEV1 1.46 [60%], F/F 92, TLC 2.62 [50%], DLCO 9.16 [42%] Severe restriction and diffusion impairment.  FVC is stable but DLCO has worsened.   6-minute walk  10/23/2017- 144 m Post walk heart rate, stats 94, 91%   02/04/2018- 249 m Post walk heart rate, sats 101, 99%   06/06/2018-212 m Post walk heart rate, sats 86, 92%   01/30/2019-163m Post  walk heart rate, sats 83, 98%      Past Medical History:  Diagnosis Date   Acute respiratory failure with hypoxia due to flash pulmonary edema requiring intubation 02/13/2020   Calculus of gallbladder with acute cholecystitis without obstruction    CHF (congestive heart failure) (HCC)    GERD (gastroesophageal reflux disease)    Hypertension    Hypothyroidism    ILD (interstitial lung disease) ([place]) 02/08/2018   Interstitial lung disease ([place])    Multinodular thyroid    benign FNA 08/2017.   Perimenopause 02/27/2019   Pulmonary edema    Scleroderma ([place])    Status post laparoscopic cholecystectomy 02/13/20 02/13/2020   Vitamin D deficiency disease 02/27/2019   Past Surgical History:   Procedure Laterality Date   ABDOMINAL HERNIA REPAIR     BUNIONECTOMY Bilateral 10/2018   CHOLECYSTECTOMY N/A 02/13/2020   Procedure: LAPAROSCOPIC CHOLECYSTECTOMY;  Surgeon: Aviva Signs, MD;  Location: [facility];  Service: General;  Laterality: N/A;   COLONOSCOPY WITH PROPOFOL N/A 01/02/2019   Procedure: COLONOSCOPY WITH PROPOFOL;  Surgeon: Daneil Dolin, MD;  Location: [facility];  Service: Endoscopy;  Laterality: N/A;  12:30pm   ESOPHAGOGASTRODUODENOSCOPY (EGD) WITH PROPOFOL N/A 01/28/2018   erosive reflux esophagitis, patulous EG Junction, no dilation, incomplete EGD due to retained food in stomach. GES thereafter with delayed gastric emptying.    RIGHT HEART CATH N/A 09/27/2017   Procedure: RIGHT HEART CATH;  Surgeon: Jolaine Artist, MD;  Location: [place] [facility] LAB;  Service: Cardiovascular;  Laterality: N/A;   RIGHT HEART CATH N/A 09/05/2019   Procedure: RIGHT HEART CATH;  Surgeon: Jolaine Artist, MD;  Location: [place] [facility];  Service: Cardiovascular;  Laterality: N/A;   RIGHT/LEFT HEART CATH AND CORONARY ANGIOGRAPHY N/A 08/18/2021   Procedure: RIGHT/LEFT HEART CATH AND CORONARY ANGIOGRAPHY;  Surgeon: Jolaine Artist, MD;  Location: [place] [facility] LAB;  Service: Cardiovascular;  Laterality: N/A;   UTERINE FIBROID SURGERY       Current Outpatient Medications  Medication Sig Dispense Refill   acetaminophen (TYLENOL) 325 MG tablet Take 2 tablets (650 mg total) by mouth every 6 (six) hours as needed for mild pain, fever or headache (or Fever >/= 101). 12 tablet 0   amLODipine (NORVASC) 10 MG tablet Take 1 tablet (10 mg total) by mouth daily. 30 tablet 6   bisoprolol (ZEBETA) 5 MG tablet Take 1 tablet (5 mg total) by mouth daily. 90 tablet 3   clindamycin-benzoyl peroxide (BENZACLIN) gel Apply 1 Application topically as needed.     clobetasol cream (TEMOVATE) 4.48 % Apply 1 Application topically as needed.     esomeprazole (NEXIUM) 40 MG capsule Take 1 capsule (40 mg  total) by mouth 2 (two) times daily. 180 capsule 1   furosemide (LASIX) 40 MG tablet Take 1 tablet (40 mg total) by mouth daily as needed for edema. 30 tablet 6   macitentan (OPSUMIT) 10 MG tablet Take 1 tablet (10 mg total) by mouth daily. 30 tablet 11   Multiple Vitamins-Minerals (HM MULTIVITAMIN ADULT GUMMY PO) Take 2 tablets by mouth daily.     mycophenolate (CELLCEPT) 500 MG tablet Take 3 tablets (1,500 mg total) by mouth every morning AND 3 tablets (1,500 mg total) every evening. 540 tablet 1   Nintedanib (OFEV) 150 MG CAPS Take 1 capsule (150 mg total) by mouth 2 (two) times daily. 60 capsule 4   NP THYROID 120 MG tablet Take 1 tablet (120 mg total) by mouth daily before  breakfast. 90 tablet 0   potassium chloride 20 MEQ/15ML (10%) SOLN Take 20 mEq by mouth as needed.     spironolactone (ALDACTONE) 25 MG tablet Take 1 tablet (25 mg total) by mouth 2 (two) times daily. 60 tablet 6   tacrolimus (PROTOPIC) 0.1 % ointment Apply to dry skin on face and body daily to twice daily as needed. 60 g 5   triamcinolone cream (KENALOG) 0.1 % Apply 1 Application topically daily.     No current facility-administered medications for this encounter.    Allergies:   Lisinopril   Social History:  The patient  reports that she has never smoked. She has never used smokeless tobacco. She reports that she does not drink alcohol and does not use drugs.   Family History:  The patient's family history includes Diabetes in her maternal grandfather, maternal uncle, and mother; Hypertension in her brother, father, and sister.   ROS:  Please see the history of present illness.   All other systems are personally reviewed and negative.   Vitals:   11/03/21 1439  BP: 120/82  Pulse: 72  SpO2: 100%  Weight: 61.8 kg (136 lb 3.2 oz)    Exam:   General:  Well appearing. No resp difficulty HEENT: normal Neck: supple. no JVD. Carotids 2+ bilat; no bruits. No lymphadenopathy or thryomegaly appreciated. Cor: PMI  nondisplaced. Regular rate & rhythm. No rubs, gallops or murmurs. Lungs: clear Abdomen: soft, nontender, nondistended. No hepatosplenomegaly. No bruits or masses. Good bowel sounds. Extremities: no cyanosis, clubbing, rash, edema Neuro: alert & orientedx3, cranial nerves grossly intact. moves all 4 extremities w/o difficulty. Affect pleasant    Recent Labs: 11/22/2020: ALT 9 06/11/2021: TSH 1.304 08/18/2021: BUN 16; Creatinine, Ser 0.60; Hemoglobin 10.5; Platelets 180; Potassium 3.2; Sodium 143  Personally reviewed   Wt Readings from Last 3 Encounters:  11/03/21 61.8 kg (136 lb 3.2 oz)  09/29/21 62.3 kg (137 lb 6.4 oz)  08/25/21 61.9 kg (136 lb 6.4 oz)      ASSESSMENT AND PLAN:  1. Chronic diastolic HF - Fluid status looks good  - Echo  07/21/21 EF 60-65% G1DD RV ok Personally reviewed - Continue lasix 40 mg as needed - Would not start SGLT2i currently with low filling pressures on cath   2. PAH  - mild - tolerating macitentan well.  - RHC 4/23 RA 3 PA 6/14 (23) PCW 3 Fick 7.3/4.4 PVR = 2.7  3. HTN - Blood pressure well controlled. Continue current regimen.   4. Scleroderma - Followed by Dr Trudie Reed - continue Cellcept - No change   5. Pulmonary fibrosis - High res CT chest c/w with ILD. Stable 2/21 and 1/22 - Followed by Dr Vaughan Browner - Continue Ofev & Cellcept  Signed, Glori Bickers, MD  11/03/2021 2:53 PM  [place] [address] [facility] [place] [address] [phone] (office) [phone] (fax)

## 2021-11-03 NOTE — Patient Instructions (Signed)
Your physician recommends that you schedule a follow-up appointment in: 6 months ( December 2023) ** please call us in September to arrange to follow up**.  If you have any questions or concerns before your next appointment please send [place] a message through [place] or call our office at [phone].    TO LEAVE A MESSAGE FOR THE NURSE SELECT OPTION 2, PLEASE LEAVE A MESSAGE INCLUDING: YOUR NAME DATE OF BIRTH CALL BACK NUMBER REASON FOR CALL**this is important as we prioritize the call backs  YOU WILL RECEIVE A CALL BACK THE SAME DAY AS LONG AS YOU CALL BEFORE 4:00 PM  At the [facility], you and your health needs are our priority. As part of our continuing mission to provide you with exceptional heart care, we have created designated Provider Care Teams. These Care Teams include your primary Cardiologist (physician) and Advanced Practice Providers (APPs- Physician Assistants and Nurse Practitioners) who all work together to provide you with the care you need, when you need it.   You may see any of the following providers on your designated Care Team at your next follow up: Dr Glori Bickers Dr Haynes Kerns, NP Lyda Jester, [place] [facility] [place], [place] Audry Riles, PharmD   Please be sure to bring in all your medications bottles to every appointment.

## 2021-11-07 ENCOUNTER — Telehealth (HOSPITAL_COMMUNITY): Payer: Self-pay

## 2021-11-07 ENCOUNTER — Other Ambulatory Visit (HOSPITAL_COMMUNITY): Payer: 59

## 2021-11-07 ENCOUNTER — Other Ambulatory Visit: Payer: Self-pay | Admitting: Internal Medicine

## 2021-11-07 DIAGNOSIS — E039 Hypothyroidism, unspecified: Secondary | ICD-10-CM | POA: Diagnosis not present

## 2021-11-07 NOTE — Telephone Encounter (Signed)
No response from pt in regards to pulmonary rehab. Closed referral.

## 2021-11-08 LAB — TSH+FREE T4
Free T4: 1.39 ng/dL (ref 0.82–1.77)
TSH: 1.59 u[IU]/mL (ref 0.450–4.500)

## 2021-11-08 LAB — SPECIMEN STATUS REPORT

## 2021-11-10 ENCOUNTER — Ambulatory Visit (HOSPITAL_COMMUNITY)
Admission: RE | Admit: 2021-11-10 | Discharge: 2021-11-10 | Disposition: A | Discharge status: Home / Self Care | Payer: 59 | Source: Ambulatory Visit | Attending: Pulmonary Disease | Admitting: Pulmonary Disease

## 2021-11-10 ENCOUNTER — Encounter: Payer: Self-pay | Admitting: Internal Medicine

## 2021-11-10 ENCOUNTER — Ambulatory Visit: Payer: 59 | Admitting: Internal Medicine

## 2021-11-10 VITALS — BP 136/88 | Resp 18 | Ht 64.0 in | Wt 135.8 lb

## 2021-11-10 DIAGNOSIS — E039 Hypothyroidism, unspecified: Secondary | ICD-10-CM

## 2021-11-10 DIAGNOSIS — J849 Interstitial pulmonary disease, unspecified: Secondary | ICD-10-CM | POA: Diagnosis not present

## 2021-11-10 DIAGNOSIS — K30 Functional dyspepsia: Secondary | ICD-10-CM

## 2021-11-10 DIAGNOSIS — N951 Menopausal and female climacteric states: Secondary | ICD-10-CM | POA: Diagnosis not present

## 2021-11-10 DIAGNOSIS — M545 Low back pain, unspecified: Secondary | ICD-10-CM | POA: Insufficient documentation

## 2021-11-10 DIAGNOSIS — I7300 Raynaud's syndrome without gangrene: Secondary | ICD-10-CM

## 2021-11-10 DIAGNOSIS — J8409 Other alveolar and parieto-alveolar conditions: Secondary | ICD-10-CM | POA: Insufficient documentation

## 2021-11-10 DIAGNOSIS — I700 Atherosclerosis of aorta: Secondary | ICD-10-CM | POA: Diagnosis not present

## 2021-11-10 DIAGNOSIS — R21 Rash and other nonspecific skin eruption: Secondary | ICD-10-CM | POA: Insufficient documentation

## 2021-11-10 DIAGNOSIS — I272 Pulmonary hypertension, unspecified: Secondary | ICD-10-CM | POA: Diagnosis not present

## 2021-11-10 DIAGNOSIS — R918 Other nonspecific abnormal finding of lung field: Secondary | ICD-10-CM | POA: Diagnosis not present

## 2021-11-10 DIAGNOSIS — R0602 Shortness of breath: Secondary | ICD-10-CM | POA: Insufficient documentation

## 2021-11-10 DIAGNOSIS — J84112 Idiopathic pulmonary fibrosis: Secondary | ICD-10-CM | POA: Diagnosis not present

## 2021-11-10 DIAGNOSIS — M791 Myalgia, unspecified site: Secondary | ICD-10-CM | POA: Insufficient documentation

## 2021-11-10 DIAGNOSIS — R209 Unspecified disturbances of skin sensation: Secondary | ICD-10-CM | POA: Insufficient documentation

## 2021-11-10 DIAGNOSIS — J479 Bronchiectasis, uncomplicated: Secondary | ICD-10-CM | POA: Diagnosis not present

## 2021-11-10 DIAGNOSIS — K2100 Gastro-esophageal reflux disease with esophagitis, without bleeding: Secondary | ICD-10-CM | POA: Insufficient documentation

## 2021-11-10 HISTORY — DX: Functional dyspepsia: K30

## 2021-11-10 HISTORY — DX: Raynaud's syndrome without gangrene: I73.00

## 2021-11-10 NOTE — Patient Instructions (Signed)
Please start taking NP thyroid in the morning on empty stomach.  Please continue taking other medications as prescribed.  Please continue to follow low salt diet and ambulate as tolerated.

## 2021-11-10 NOTE — Assessment & Plan Note (Signed)
Likely from ILD due to scleroderma Followed by CHF clinic Appears euvolemic currently, on spironolactone and as needed Lasix currently On Opsumit currently

## 2021-11-10 NOTE — Progress Notes (Signed)
Established Patient Office Visit  Subjective:  Patient ID: Karen [name], female    DOB: [birth year]  Age: 51 y.o. MRN: [id number]  CC:  Chief Complaint  Patient presents with   Follow-up    3 month follow up     HPI Karen [name] is a 51 y.o. female with past medical history of HTN, scleroderma, ILD, pulmonary hypertension, dysphagia, hypothyroidism, chronic constipation and hot flashes who presents for f/u of her chronic medical conditions.  She has history of hypothyroidism, for which she takes NP thyroid.  She currently denies any tremors, palpitations, or recent change in weight or appetite.  She complains of hot flashes, and was planning to start HRT.  She agrees to discuss it with her CHF physician before considering to start HRT.   Essential hypertension and pulmonary hypertension: Her blood pressure is well controlled with amlodipine, Zebeta and spironolactone currently.  She has history of pulmonary hypertension from ILD, for which she takes spironolactone, Opsumit and Lasix currently.  She follows up with CHF clinic as well.  She currently denies any dyspnea or recent worsening of leg swelling.   She has history of scleroderma, for which she sees Dr. Nickola Major.  She sees Dr. 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  She is on Ofev for it.   Past Medical History:  Diagnosis Date   Acute respiratory failure with hypoxia due to flash pulmonary edema requiring intubation 02/13/2020   Calculus of gallbladder with acute cholecystitis without obstruction    CHF (congestive heart failure) (HCC)    GERD (gastroesophageal reflux disease)    Hypertension    Hypothyroidism    ILD (interstitial lung disease) (HCC) 02/08/2018   Interstitial lung disease (HCC)    Multinodular thyroid    benign FNA 08/2017.   Perimenopause 02/27/2019   Pulmonary edema    Scleroderma (HCC)    Status post laparoscopic cholecystectomy 02/13/20 02/13/2020   Vitamin D deficiency disease 02/27/2019     Past Surgical History:  Procedure Laterality Date   ABDOMINAL HERNIA REPAIR     BUNIONECTOMY Bilateral 10/2018   CHOLECYSTECTOMY N/A 02/13/2020   Procedure: LAPAROSCOPIC CHOLECYSTECTOMY;  Surgeon: Franky Macho, MD;  Location: [facility];  Service: General;  Laterality: N/A;   COLONOSCOPY WITH PROPOFOL N/A 01/02/2019   Procedure: COLONOSCOPY WITH PROPOFOL;  Surgeon: Corbin Ade, MD;  Location: [facility];  Service: Endoscopy;  Laterality: N/A;  12:30pm   ESOPHAGOGASTRODUODENOSCOPY (EGD) WITH PROPOFOL N/A 01/28/2018   erosive reflux esophagitis, patulous EG Junction, no dilation, incomplete EGD due to retained food in stomach. GES thereafter with delayed gastric emptying.    RIGHT HEART CATH N/A 09/27/2017   Procedure: RIGHT HEART CATH;  Surgeon: Dolores Patty, MD;  [facility] INVASIVE [facility] LAB;  Service: Cardiovascular;  Laterality: N/A;   RIGHT HEART CATH N/A 09/05/2019   Procedure: RIGHT HEART CATH;  Surgeon: Dolores Patty, MD;  Location: [facility] INVASIVE CV LAB;  Service: Cardiovascular;  Laterality: N/A;   RIGHT/LEFT HEART CATH AND CORONARY ANGIOGRAPHY N/A 08/18/2021   Procedure: RIGHT/LEFT HEART CATH AND CORONARY ANGIOGRAPHY;  Surgeon: Dolores Patty, MD;  Location: [facility] INVASIVE CV LAB;  Service: Cardiovascular;  Laterality: N/A;   UTERINE FIBROID SURGERY      Family History  Problem Relation Age of Onset   Hypertension Father    Hypertension Sister    Hypertension Brother    Diabetes Maternal Grandfather    Diabetes Maternal Uncle    Diabetes Mother    Colon cancer  Neg Hx     Social History   Socioeconomic History   Marital status: Single    Spouse name: Not on file   Number of children: 0   Years of education: Not on file   Highest education level: Associate degree: occupational, Hotel manager, or vocational program  Occupational History   Occupation: CMA    Employer: [employer]  Tobacco Use   Smoking status: Never   Smokeless tobacco: Never   Vaping Use   Vaping Use: Former  Substance and Sexual Activity   Alcohol use: No   Drug use: No   Sexual activity: Yes  Other Topics Concern   Not on file  Social History Narrative   Patient is right-handed. She lives alone in one level home, a few steps to enter.CMA [facility].   Social Determinants of Health   Financial Resource Strain: Not on file  Food Insecurity: Not on file  Transportation Needs: Not on file  Physical Activity: Not on file  Stress: Not on file  Social Connections: Not on file  Intimate Partner Violence: Not on file    Outpatient Medications Prior to Visit  Medication Sig Dispense Refill   acetaminophen (TYLENOL) 325 MG tablet Take 2 tablets (650 mg total) by mouth every 6 (six) hours as needed for mild pain, fever or headache (or Fever >/= 101). 12 tablet 0   amLODipine (NORVASC) 10 MG tablet Take 1 tablet (10 mg total) by mouth daily. 30 tablet 6   bisoprolol (ZEBETA) 5 MG tablet Take 1 tablet (5 mg total) by mouth daily. 90 tablet 3   clindamycin-benzoyl peroxide (BENZACLIN) gel Apply 1 Application topically as needed.     clobetasol cream (TEMOVATE) 1.69 % Apply 1 Application topically as needed.     esomeprazole (NEXIUM) 40 MG capsule Take 1 capsule (40 mg total) by mouth 2 (two) times daily. 180 capsule 1   furosemide (LASIX) 40 MG tablet Take 1 tablet (40 mg total) by mouth daily as needed for edema. 30 tablet 6   macitentan (OPSUMIT) 10 MG tablet Take 1 tablet (10 mg total) by mouth daily. 30 tablet 11   Multiple Vitamins-Minerals (HM MULTIVITAMIN ADULT GUMMY PO) Take 2 tablets by mouth daily.     mycophenolate (CELLCEPT) 500 MG tablet Take 3 tablets (1,500 mg total) by mouth every morning AND 3 tablets (1,500 mg total) every evening. 540 tablet 1   Nintedanib (OFEV) 150 MG CAPS Take 1 capsule (150 mg total) by mouth 2 (two) times daily. 60 capsule 4   NP THYROID 120 MG tablet Take 1 tablet (120 mg total) by mouth daily before breakfast. 90  tablet 0   potassium chloride 20 MEQ/15ML (10%) SOLN Take 20 mEq by mouth as needed.     spironolactone (ALDACTONE) 25 MG tablet Take 1 tablet (25 mg total) by mouth 2 (two) times daily. 60 tablet 6   tacrolimus (PROTOPIC) 0.1 % ointment Apply to dry skin on face and body daily to twice daily as needed. 60 g 5   triamcinolone cream (KENALOG) 0.1 % Apply 1 Application topically daily.     No facility-administered medications prior to visit.    Allergies  Allergen Reactions   Lisinopril Hives, Swelling and Other (See Comments)    ROS Review of Systems  Constitutional:  Positive for fatigue. Negative for chills and fever.  HENT:  Negative for congestion, sinus pressure, sinus pain and sore throat.   Eyes:  Negative for pain and discharge.  Respiratory:  Negative for cough and shortness of breath.   Cardiovascular:  Negative for chest pain and palpitations.  Gastrointestinal:  Positive for constipation. Negative for abdominal pain, nausea and vomiting.  Endocrine: Positive for heat intolerance. Negative for polydipsia and polyuria.  Genitourinary:  Negative for dysuria and hematuria.  Musculoskeletal:  Positive for arthralgias. Negative for neck pain and neck stiffness.  Skin:  Negative for rash.  Neurological:  Negative for dizziness and weakness.  Psychiatric/Behavioral:  Negative for agitation and behavioral problems.       Objective:    Physical Exam Vitals reviewed.  Constitutional:      General: She is not in acute distress.    Appearance: She is not diaphoretic.  HENT:     Head: Normocephalic and atraumatic.     Nose: Nose normal.     Mouth/Throat:     Mouth: Mucous membranes are moist.  Eyes:     General: No scleral icterus.    Extraocular Movements: Extraocular movements intact.  Cardiovascular:     Rate and Rhythm: Normal rate and regular rhythm.     Pulses: Normal pulses.     Heart sounds: Normal heart sounds. No murmur heard. Pulmonary:     Breath sounds:  Normal breath sounds. No wheezing or rales.  Musculoskeletal:     Cervical back: Neck supple. No tenderness.     Right lower leg: No edema.     Left lower leg: No edema.  Skin:    General: Skin is warm.     Findings: No rash.  Neurological:     General: No focal deficit present.     Mental Status: She is alert and oriented to person, place, and time.     Sensory: No sensory deficit.     Motor: No weakness.  Psychiatric:        Mood and Affect: Mood normal.        Behavior: Behavior normal.     BP 136/88 (BP Location: Left Arm, Patient Position: Sitting, Cuff Size: Normal)   Resp 18   Ht $R'5\' 4"'QN$  (1.626 m)   Wt 135 lb 12.8 oz (61.6 kg)   BMI 23.31 kg/m  Wt Readings from Last 3 Encounters:  11/10/21 135 lb 12.8 oz (61.6 kg)  11/03/21 136 lb 3.2 oz (61.8 kg)  09/29/21 137 lb 6.4 oz (62.3 kg)    Lab Results  Component Value Date   TSH 1.590 11/07/2021   Lab Results  Component Value Date   WBC 6.6 08/18/2021   HGB 10.5 (L) 08/18/2021   HCT 31.0 (L) 08/18/2021   MCV 90.6 08/18/2021   PLT 180 08/18/2021   Lab Results  Component Value Date   NA 143 08/18/2021   K 3.2 (L) 08/18/2021   CO2 24 08/18/2021   GLUCOSE 90 08/18/2021   BUN 16 08/18/2021   CREATININE 0.60 08/18/2021   BILITOT 0.4 11/22/2020   ALKPHOS 110 11/22/2020   AST 18 11/22/2020   ALT 9 11/22/2020   PROT 7.9 11/22/2020   ALBUMIN 4.7 11/22/2020   CALCIUM 10.4 (H) 08/18/2021   ANIONGAP 7 08/18/2021   EGFR 105 07/22/2021   GFR 87.51 11/20/2019   Lab Results  Component Value Date   CHOL 187 03/22/2017   Lab Results  Component Value Date   HDL 43 03/22/2017   Lab Results  Component Value Date   LDLCALC 118 (H) 03/22/2017   Lab Results  Component Value Date   TRIG 135 02/14/2020   Lab Results  Component  Value Date   CHOLHDL 4.3 03/22/2017   No results found for: "HGBA1C"    Assessment & Plan:   Problem List Items Addressed This Visit       Cardiovascular and Mediastinum   Hot  flashes due to menopause    Advised to discuss with CHF physician about starting HRT, if deemed safe from cardiac standpoint, will start HRT      Pulmonary hypertension ([place])    Likely from ILD due to scleroderma Followed by CHF clinic Appears euvolemic currently, on spironolactone and as needed Lasix currently On Opsumit currently        Respiratory   ILD (interstitial lung disease) ([place])    Followed by Dr. Vaughan Browner On Dickey Gave currently Has had flash pulmonary edema and pulmonary hypertension from it, followed by CHF clinic        Endocrine   Hypothyroidism, adult - Primary    Lab Results  Component Value Date   TSH 1.590 11/07/2021  On NP thyroid 120 mg QD -needs to take it in a.m. on empty stomach Check TSH and free T4      Relevant Orders   TSH + free T4    No orders of the defined types were placed in this encounter.   Follow-up: Return in about 5 months (around 04/12/2022) for Annual physical.    Lindell Spar, MD

## 2021-11-10 NOTE — Assessment & Plan Note (Signed)
Advised to discuss with CHF physician about starting HRT, if deemed safe from cardiac standpoint, will start HRT

## 2021-11-10 NOTE — Assessment & Plan Note (Signed)
Followed by Dr. Vaughan Browner On Dickey Gave currently Has had flash pulmonary edema and pulmonary hypertension from it, followed by CHF clinic

## 2021-11-10 NOTE — Assessment & Plan Note (Signed)
Lab Results  Component Value Date   TSH 1.590 11/07/2021   On NP thyroid 120 mg QD -needs to take it in a.m. on empty stomach Check TSH and free T4 

## 2021-11-22 ENCOUNTER — Other Ambulatory Visit (HOSPITAL_COMMUNITY): Payer: Self-pay

## 2021-11-22 MED ORDER — MACITENTAN 10 MG PO TABS: 10.0000 mg | ORAL_TABLET | Freq: Every day | ORAL | 11 refills | Status: DC

## 2021-11-23 ENCOUNTER — Other Ambulatory Visit (HOSPITAL_COMMUNITY): Payer: Self-pay

## 2021-11-24 DIAGNOSIS — H9202 Otalgia, left ear: Secondary | ICD-10-CM | POA: Diagnosis not present

## 2021-11-25 ENCOUNTER — Other Ambulatory Visit (HOSPITAL_COMMUNITY): Payer: Self-pay

## 2021-12-01 DIAGNOSIS — M7741 Metatarsalgia, right foot: Secondary | ICD-10-CM | POA: Diagnosis not present

## 2021-12-01 DIAGNOSIS — M7742 Metatarsalgia, left foot: Secondary | ICD-10-CM | POA: Diagnosis not present

## 2021-12-12 DIAGNOSIS — M79674 Pain in right toe(s): Secondary | ICD-10-CM | POA: Insufficient documentation

## 2021-12-21 ENCOUNTER — Encounter: Payer: Self-pay | Admitting: *Deleted

## 2021-12-29 ENCOUNTER — Encounter: Payer: Self-pay | Admitting: Pulmonary Disease

## 2021-12-29 ENCOUNTER — Ambulatory Visit (INDEPENDENT_AMBULATORY_CARE_PROVIDER_SITE_OTHER): Payer: 59 | Admitting: Pulmonary Disease

## 2021-12-29 VITALS — BP 126/80 | HR 64 | Temp 98.1°F | Ht 64.0 in | Wt 136.6 lb

## 2021-12-29 DIAGNOSIS — Z1231 Encounter for screening mammogram for malignant neoplasm of breast: Secondary | ICD-10-CM | POA: Diagnosis not present

## 2021-12-29 DIAGNOSIS — J849 Interstitial pulmonary disease, unspecified: Secondary | ICD-10-CM | POA: Diagnosis not present

## 2021-12-29 DIAGNOSIS — Z01419 Encounter for gynecological examination (general) (routine) without abnormal findings: Secondary | ICD-10-CM | POA: Diagnosis not present

## 2021-12-29 DIAGNOSIS — Z130 Encounter for screening for diseases of the blood and blood-forming organs and certain disorders involving the immune mechanism: Secondary | ICD-10-CM | POA: Diagnosis not present

## 2021-12-29 DIAGNOSIS — Z1389 Encounter for screening for other disorder: Secondary | ICD-10-CM | POA: Diagnosis not present

## 2021-12-29 LAB — HM MAMMOGRAPHY

## 2021-12-29 NOTE — Progress Notes (Addendum)
Karen [name]    [id number]    [birth year]  Primary Care Physician:Patel, Colin Broach, MD  Referring Physician: Lindell Spar, MD [address] [place],  [place] [address]  Problem list:  Scleroderma ILD with pulmonary HTN CellCept started May 2019, Ofev started December 2020  Pulmonary hypertension-started macitentan April 2021 Transplant candidate at [facility]  HPI: 51 y.o.  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 Haroldine Laws on 09/27/17 with no evidence of pulmonary hypertension Started on CellCept May 2019 and uptitrated to max dose of 1.5 mg twice daily.  She had a hospitalization in [facility] in March 2019 for hypertension.  A CT chest scan at that time showed subtle lower lung findings of atelectasis, groundglass.  She has a right thyroid nodule which was biopsied on 08/15/17 with pathology showing benign findings.  Thyroid function tests are normal.  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 Finished pulmonary rehab in 2021 and is back at work full-time Repeat right heart cath 2021 by Dr. Haroldine Laws showed mild pulmonary hypertension she has been started on  macitetan  Reviewed note from Dr. Trudie Reed, rheumatology 07/12/2017 Seen for joint pain, Raynaud's, elevated CK, skin thickening Serologies positive for Sjogren's negative,, normal CK and aldolase.  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  No mold. No hot tub, Jacuzzi. She has down comforter.  Smoking history: Never smoker Travel history: Grew up in [place].  Lived in [place] and [place] Relevant family history: No family history of lung disease.    Interim history: Continues on CellCept 1.5 mg twice  daily and Ofev On macitentan for pulmonary hypertension per Dr. Haroldine Laws States that breathing is stable with no issues  Has annual follow-up at [facility] transplant program last month when she las pulmonary function test that show a decline in DLCO from 39% to 35%. Reports worse exercise capacity and more fatigue  She also had a repeat RHC in April 2023 which showed very mild PAH.  Outpatient Encounter Medications as of 12/29/2021  Medication Sig   acetaminophen (TYLENOL) 325 MG tablet Take 2 tablets (650 mg total) by mouth every 6 (six) hours as needed for mild pain, fever or headache (or Fever >/= 101).   amLODipine (NORVASC) 10 MG tablet Take 1 tablet (10 mg total) by mouth daily.   bisoprolol (ZEBETA) 5 MG tablet Take 1 tablet (5 mg total) by mouth daily.   clindamycin-benzoyl peroxide (BENZACLIN) gel Apply 1 Application topically as needed.   clobetasol cream (TEMOVATE) 3.81 % Apply 1 Application topically as needed.   esomeprazole (NEXIUM) 40 MG capsule Take 1 capsule (40 mg total) by mouth 2 (two) times daily.   furosemide (LASIX) 40 MG tablet Take 1 tablet (40 mg total) by mouth daily as needed for edema.   macitentan (OPSUMIT) 10 MG tablet Take 1 tablet (10 mg total) by mouth daily.   Multiple Vitamins-Minerals (HM MULTIVITAMIN ADULT GUMMY PO) Take 2 tablets by mouth daily.   mycophenolate (CELLCEPT) 500 MG tablet Take 3 tablets (1,500 mg total) by mouth every morning AND 3 tablets (1,500 mg total) every evening.   Nintedanib (OFEV) 150 MG CAPS Take 1 capsule (150 mg total) by mouth 2 (two) times daily.  NP THYROID 120 MG tablet Take 1 tablet (120 mg total) by mouth daily before breakfast.   potassium chloride 20 MEQ/15ML (10%) SOLN Take 20 mEq by mouth as needed.   spironolactone (ALDACTONE) 25 MG tablet Take 1 tablet (25 mg total) by mouth 2 (two) times daily.   tacrolimus (PROTOPIC) 0.1 % ointment Apply to dry skin on face and body daily to twice daily as needed.   triamcinolone  cream (KENALOG) 0.1 % Apply 1 Application topically daily as needed.   [DISCONTINUED] PARoxetine (PAXIL) 10 MG tablet Take 1 tablet (10 mg total) by mouth daily as directed   No facility-administered encounter medications on file as of 12/29/2021.    Physical Exam: Blood pressure 138/80, pulse 66, temperature 98.4 F (36.9 C), temperature source Oral, height '5\' 5"'$  (1.651 m), weight 137 lb 6.4 oz (62.3 kg), SpO2 95 %. Gen:      No acute distress HEENT:  EOMI, sclera anicteric Neck:     No masses; no thyromegaly Lungs:    Clear to auscultation bilaterally; normal respiratory effort CV:         Regular rate and rhythm; no murmurs Abd:      + bowel sounds; soft, non-tender; no palpable masses, no distension Ext:    No edema; adequate peripheral perfusion Skin:      Warm and dry; no rash Neuro: alert and oriented x 3 Psych: normal mood and affect   Data Reviewed: Imaging CT chest, [facility] 07/24/2017 Dependent atelectasis, subtle groundglass opacities at the base.  2.5 cm right thyroid nodule.  I have reviewed the images personally  CT high-resolution 09/18/2017- patchy reticular groundglass opacities, reticulation with subpleural sparing.  Mild traction bronchiectasis.  No honeycombing.  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  Aortic atherosclerosis, three-vessel coronary artery disease.  CT high-resolution 06/27/2018-stable interstitial lung disease.  CT high-resolution 06/11/2019-stable interstitial lung disease  CT high-resolution 05/19/2020-stable interstitial lung disease  CT high-resolution 11/10/2021-interstitial lung disease with slight progression compared to before I have reviewed the images personally.  PFTs FENO 09/06/2017-unable to complete  07/27/2017 FVC 1.65 (54%), FEV1 1.53 (62%], F/F 93, TLC 50, DLCO 44%, DLCO/VA 131%  10/25/2017 FVC 1.51 [50%], FEV1 1.26 [51%], F/F 83  01/29/2018 FVC  1.69 [5%), FEV1 1.50 [61%], F/F 89, DLCO 10.57 [41%]  07/01/2018 FVC 1.57 [52%), FEV1 1.47 [60%], F/F 93, DLCO 12.54 [59%)  01/06/2019 FVC 1.58 [52%], FEV1 1.46 [60%], F/F 92, TLC 2.62 [50%], DLCO 9.16 [42%] Severe restriction and diffusion impairment.  FVC is stable but DLCO has worsened.  6-minute walk  10/23/2017- 144 m Post walk heart rate, stats 94, 91%  02/04/2018- 249 m Post walk heart rate, sats 101, 99%  06/06/2018-212 m Post walk heart rate, sats 86, 92%  01/30/2019-165mPost walk heart rate, sats 83, 98%  01/13/21-238 m  Labs 08/20/2017-ANA, centromere, rheumatoid factor, SCL 70-negative QuantiFERON 09/12/2017-negative Hepatitis B, C screening 09/21/2017-negative G6PD 09/25/2017-13  Labs from Dr. HTrudie Reed3/7/19 Rheumatoid factor < 10, CCP-8, ANCA-negative, double-stranded DNA-negative, Smith antibody-negative, SCL 70-negative, RNP-negative SSA 5.8, SSB 1.5 Smith antibody-negative, ANA IFA-negative Myositis panel-negative CK 165, C4-30, C3-168, total complement greater than 60, aldolase 8.8 Sed rate 9, CRP 0.9  CBC, hepatic panel 09/16/2020-within normal limits  Cardiac Cardiac cath 09/05/2019 PA = 38/16 (25) PCW = 6 Fick cardiac output/index = 5.5/3.4 PVR = 3.4 WU Mild pulmonary hypertension  Cardiac cath 08/16/2021 PA = 36/14 (23) PCW = 3 Fick cardiac output/index =  7.3/4.4 PVR = 2.7  Overnight oximetry 01/30/2019- test time 8 hours 25 minutes Lowest O2 sat 87%.  Mean O2 sat 95%.  Time spent less than 88% - 1 minute 30 seconds No significant desaturation.  Does not need supplemental oxygen.  Assessment:  Scleroderma ILD Continue on CellCept 1.5 g twice daily, Bactrim prophylaxis and ofev  Unable to do pulmonary rehab as it interferes with her work and she is exercising with walking at home. Follow-up at [facility] transplant and recent labs at [facility] show normal liver function and blood counts.  CT reviewed with read of progression but this appears to be minimal on  my review.  We can consider Actemra or Rituxan as an alternative to CellCept.  I will discuss with Dr. Trudie Reed about changing therapy Follow-up high-res CT and PFTs in 6 months  Hypercalcemia She has mild hypercalcemia of unclear etiology.  She had PTH and vitamin D levels checked by her primary care which are within normal limits She has stopped taking vitamin D supplement.  Continue monitoring.  Mild pulmonary hypertension On macitentan.  Follows with Dr. Haroldine Laws RHC earlier in 2023 with very mild pulm HTN  Esophageal dysfunction, esophageal stricture, GERD Continue on Protonix 20 mg twice daily Follows with [facility] gastroenterology  Plan/Recommendations: - Continue CellCept, Ofev - Bactrim prophylaxis. - HRCT - Exercise  Marshell Garfinkel MD [facility] 12/29/2021, 3:16 PM

## 2021-12-29 NOTE — Patient Instructions (Signed)
I will be in touch with Dr. Trudie Reed about treatment options for scleroderma In the meantime continue CellCept and Ofev We will schedule high-res CT and PFTs in 6 months and follow-up in clinic after

## 2022-01-05 DIAGNOSIS — J849 Interstitial pulmonary disease, unspecified: Secondary | ICD-10-CM | POA: Diagnosis not present

## 2022-01-05 DIAGNOSIS — I272 Pulmonary hypertension, unspecified: Secondary | ICD-10-CM | POA: Diagnosis not present

## 2022-01-05 DIAGNOSIS — M25551 Pain in right hip: Secondary | ICD-10-CM | POA: Diagnosis not present

## 2022-01-05 DIAGNOSIS — Z6822 Body mass index (BMI) 22.0-22.9, adult: Secondary | ICD-10-CM | POA: Diagnosis not present

## 2022-01-05 DIAGNOSIS — I7300 Raynaud's syndrome without gangrene: Secondary | ICD-10-CM | POA: Diagnosis not present

## 2022-01-05 DIAGNOSIS — R5383 Other fatigue: Secondary | ICD-10-CM | POA: Diagnosis not present

## 2022-01-05 DIAGNOSIS — M25552 Pain in left hip: Secondary | ICD-10-CM | POA: Diagnosis not present

## 2022-01-05 DIAGNOSIS — M349 Systemic sclerosis, unspecified: Secondary | ICD-10-CM | POA: Diagnosis not present

## 2022-01-13 ENCOUNTER — Emergency Department (HOSPITAL_COMMUNITY): Payer: 59

## 2022-01-13 ENCOUNTER — Encounter (HOSPITAL_COMMUNITY): Payer: Self-pay | Admitting: *Deleted

## 2022-01-13 ENCOUNTER — Other Ambulatory Visit: Payer: Self-pay

## 2022-01-13 ENCOUNTER — Emergency Department (HOSPITAL_COMMUNITY)
Admission: EM | Admit: 2022-01-13 | Discharge: 2022-01-13 | Disposition: A | Discharge status: Home / Self Care | Payer: 59 | Attending: Emergency Medicine | Admitting: Emergency Medicine

## 2022-01-13 DIAGNOSIS — R079 Chest pain, unspecified: Secondary | ICD-10-CM | POA: Insufficient documentation

## 2022-01-13 DIAGNOSIS — N751 Abscess of Bartholin's gland: Secondary | ICD-10-CM | POA: Diagnosis not present

## 2022-01-13 DIAGNOSIS — R0789 Other chest pain: Secondary | ICD-10-CM | POA: Diagnosis not present

## 2022-01-13 DIAGNOSIS — I509 Heart failure, unspecified: Secondary | ICD-10-CM | POA: Insufficient documentation

## 2022-01-13 DIAGNOSIS — R0602 Shortness of breath: Secondary | ICD-10-CM | POA: Diagnosis not present

## 2022-01-13 DIAGNOSIS — I110 Hypertensive heart disease with heart failure: Secondary | ICD-10-CM | POA: Diagnosis not present

## 2022-01-13 DIAGNOSIS — R102 Pelvic and perineal pain: Secondary | ICD-10-CM | POA: Diagnosis present

## 2022-01-13 LAB — BASIC METABOLIC PANEL
Anion gap: 9 (ref 5–15)
BUN: 10 mg/dL (ref 6–20)
CO2: 28 mmol/L (ref 22–32)
Calcium: 10 mg/dL (ref 8.9–10.3)
Chloride: 104 mmol/L (ref 98–111)
Creatinine, Ser: 0.6 mg/dL (ref 0.44–1.00)
GFR, Estimated: >60 mL/min (ref >60)
Glucose, Bld: 103 mg/dL — ABNORMAL HIGH (ref 70–99)
Potassium: 3 mmol/L — ABNORMAL LOW (ref 3.5–5.1)
Sodium: 141 mmol/L (ref 135–145)

## 2022-01-13 LAB — CBC
HCT: 44.2 % (ref 36.0–46.0)
Hemoglobin: 13.7 g/dL (ref 12.0–15.0)
MCH: 27.4 pg (ref 26.0–34.0)
MCHC: 31 g/dL (ref 30.0–36.0)
MCV: 88.4 fL (ref 80.0–100.0)
Platelets: 222 10*3/uL (ref 150–400)
RBC: 5 MIL/uL (ref 3.87–5.11)
RDW: 14.1 % (ref 11.5–15.5)
WBC: 9.8 10*3/uL (ref 4.0–10.5)
nRBC: 0 % (ref 0.0–0.2)

## 2022-01-13 LAB — TROPONIN I (HIGH SENSITIVITY)
Troponin I (High Sensitivity): 11 ng/L (ref <18)
Troponin I (High Sensitivity): 13 ng/L (ref <18)

## 2022-01-13 MED ORDER — MORPHINE SULFATE (PF) 4 MG/ML IV SOLN
4.0000 mg | Freq: Once | INTRAVENOUS | Status: AC
  Administered 2022-01-13: 4 mg via INTRAVENOUS
  Filled 2022-01-13: qty 1

## 2022-01-13 MED ORDER — ONDANSETRON HCL 4 MG/2ML IJ SOLN
4.0000 mg | Freq: Four times a day (QID) | INTRAMUSCULAR | Status: DC | PRN
Start: 2022-01-13 — End: 2022-01-14
  Administered 2022-01-13: 4 mg via INTRAVENOUS
  Filled 2022-01-13: qty 2

## 2022-01-13 MED ORDER — LIDOCAINE HCL (PF) 1 % IJ SOLN
30.0000 mL | Freq: Once | INTRAMUSCULAR | Status: DC
  Filled 2022-01-13: qty 30

## 2022-01-13 MED ORDER — OXYCODONE-ACETAMINOPHEN 5-325 MG PO TABS: 1.0000 | ORAL_TABLET | Freq: Four times a day (QID) | ORAL | 0 refills | Status: DC | PRN

## 2022-01-13 MED ORDER — POTASSIUM CHLORIDE 20 MEQ PO PACK
40.0000 meq | PACK | Freq: Once | ORAL | Status: AC
  Administered 2022-01-13: 40 meq via ORAL
  Filled 2022-01-13: qty 2

## 2022-01-13 MED ORDER — DOXYCYCLINE HYCLATE 100 MG PO TABS
100.0000 mg | ORAL_TABLET | Freq: Once | ORAL | Status: AC
  Administered 2022-01-13: 100 mg via ORAL
  Filled 2022-01-13: qty 1

## 2022-01-13 MED ORDER — DOXYCYCLINE HYCLATE 100 MG PO CAPS: 100.0000 mg | ORAL_CAPSULE | Freq: Two times a day (BID) | ORAL | 0 refills | Status: DC

## 2022-01-13 MED ORDER — NITROGLYCERIN 0.4 MG SL SUBL: 0.4000 mg | SUBLINGUAL_TABLET | SUBLINGUAL | Status: DC | PRN

## 2022-01-13 MED ORDER — HYDROMORPHONE HCL 1 MG/ML IJ SOLN: 1.0000 mg | Freq: Once | INTRAMUSCULAR | Status: DC

## 2022-01-13 NOTE — ED Triage Notes (Signed)
Pt with left sided CP that is intermittent.  + SOB earlier, denies at present.    Pt also c/o left groin pain since Wednesday.  Denies any burning with urination.

## 2022-01-13 NOTE — ED Provider Notes (Signed)
[place] EMERGENCY DEPARTMENT Provider Note   CSN: [id number] Arrival date & time: 01/13/22  1315     History  Chief Complaint  Patient presents with   Chest Pain    Karen [name] is a 51 y.o. female with past medical history significant for hypertension, interstitial lung disease, scleroderma, congestive heart failure, pulmonary hypertension who presents with concern for intermittent chest pain, shortness of breath that has been going on for the last few days, is not present at this time.  Patient also reports that she is having some left groin pain since Wednesday.  She denies any vaginal discharge, burning, dysuria, frequency.  She denies fever, chills.  She reports that the chest pain, shortness of breath is not exertional in nature.  She reports she has been taking her normal medications for her scleroderma, CHF.  She denies any significant leg swelling.  She denies any cough, sore throat.   Chest Pain      Home Medications Prior to Admission medications   Medication Sig Start Date End Date Taking? Authorizing Provider  doxycycline (VIBRAMYCIN) 100 MG capsule Take 1 capsule (100 mg total) by mouth 2 (two) times daily. 01/13/22  Yes Joselyn Edling H, PA-C  oxyCODONE-acetaminophen (PERCOCET/ROXICET) 5-325 MG tablet Take 1 tablet by mouth every 6 (six) hours as needed for severe pain. 01/13/22  Yes Everlena Mackley H, PA-C  acetaminophen (TYLENOL) 325 MG tablet Take 2 tablets (650 mg total) by mouth every 6 (six) hours as needed for mild pain, fever or headache (or Fever >/= 101). 02/15/20   Roxan Hockey, MD  amLODipine (NORVASC) 10 MG tablet Take 1 tablet (10 mg total) by mouth daily. 07/21/21   Bensimhon, Shaune Pascal, MD  bisoprolol (ZEBETA) 5 MG tablet Take 1 tablet (5 mg total) by mouth daily. 09/27/21   Bensimhon, Shaune Pascal, MD  clindamycin-benzoyl peroxide (BENZACLIN) gel Apply 1 Application topically as needed.    [provider]  clobetasol cream (TEMOVATE)  9.62 % Apply 1 Application topically as needed.    [provider]  esomeprazole (NEXIUM) 40 MG capsule Take 1 capsule (40 mg total) by mouth 2 (two) times daily. 03/07/21   Erenest Rasher, PA-C  furosemide (LASIX) 40 MG tablet Take 1 tablet (40 mg total) by mouth daily as needed for edema. 07/21/21   Bensimhon, Shaune Pascal, MD  macitentan (OPSUMIT) 10 MG tablet Take 1 tablet (10 mg total) by mouth daily. 11/22/21   Bensimhon, Shaune Pascal, MD  Multiple Vitamins-Minerals (HM MULTIVITAMIN ADULT GUMMY PO) Take 2 tablets by mouth daily.    [provider]  mycophenolate (CELLCEPT) 500 MG tablet Take 3 tablets (1,500 mg total) by mouth every morning AND 3 tablets (1,500 mg total) every evening. 01/14/21   Mannam, Hart Robinsons, MD  Nintedanib (OFEV) 150 MG CAPS Take 1 capsule (150 mg total) by mouth 2 (two) times daily. 01/20/21   Marshell Garfinkel, MD  NP THYROID 120 MG tablet Take 1 tablet (120 mg total) by mouth daily before breakfast. 08/04/21   Lindell Spar, MD  potassium chloride 20 MEQ/15ML (10%) SOLN Take 20 mEq by mouth as needed.    [provider]  spironolactone (ALDACTONE) 25 MG tablet Take 1 tablet (25 mg total) by mouth 2 (two) times daily. 07/21/21   Bensimhon, Shaune Pascal, MD  tacrolimus (PROTOPIC) 0.1 % ointment Apply to dry skin on face and body daily to twice daily as needed. 05/18/21     triamcinolone cream (KENALOG) 0.1 % Apply 1 Application  topically daily as needed.    [provider]  PARoxetine (PAXIL) 10 MG tablet Take 1 tablet (10 mg total) by mouth daily as directed 10/07/20 12/02/20        Allergies    Lisinopril    Review of Systems   Review of Systems  Cardiovascular:  Positive for chest pain.  All other systems reviewed and are negative.   Physical Exam Updated Vital Signs BP (!) 142/88 (BP Location: Right Arm)   Pulse 88   Temp 98 F (36.7 C) (Oral)   Resp 18   Ht '5\' 4"'$  (1.626 m)   Wt 59.9 kg   SpO2 99%   BMI 22.66 kg/m  Physical  Exam Vitals and nursing note reviewed.  Constitutional:      General: She is not in acute distress.    Appearance: Normal appearance.  HENT:     Head: Normocephalic and atraumatic.  Eyes:     General:        Right eye: No discharge.        Left eye: No discharge.  Cardiovascular:     Rate and Rhythm: Normal rate and regular rhythm.     Heart sounds: No murmur heard.    No friction rub. No gallop.  Pulmonary:     Effort: Pulmonary effort is normal.     Breath sounds: Normal breath sounds.     Comments: Clear breath sounds bilaterally, no respiratory distress. No wheezing, rhonchi, stridor. Chest:     Comments: No tenderness to palpation of chest wall. Abdominal:     General: Bowel sounds are normal.     Palpations: Abdomen is soft.  Genitourinary:    Comments: Patient with evidence of acute bartholin gladn abscess at vaginal entroitus on left at 4 o clock position. On drainage, there was expression of a large amount of foul smelling purulent discharge and some blood. Relief of swelling after drainage. No evidence of intravaginal purulent drainage Skin:    General: Skin is warm and dry.     Capillary Refill: Capillary refill takes less than 2 seconds.  Neurological:     Mental Status: She is alert and oriented to person, place, and time.  Psychiatric:        Mood and Affect: Mood normal.        Behavior: Behavior normal.     ED Results / Procedures / Treatments   Labs (all labs ordered are listed, but only abnormal results are displayed) Labs Reviewed  BASIC METABOLIC PANEL - Abnormal; Notable for the following components:      Result Value   Potassium 3.0 (*)    Glucose, Bld 103 (*)    All other components within normal limits  WET PREP, GENITAL  CBC  TROPONIN I (HIGH SENSITIVITY)  TROPONIN I (HIGH SENSITIVITY)    EKG None  Radiology DG Chest 2 View  Result Date: 01/13/2022 CLINICAL DATA:  Left-sided chest pain intermittently with shortness of breath. History  of scleroderma. EXAM: CHEST - 2 VIEW COMPARISON:  02/15/2020 FINDINGS: Lungs are somewhat hypoinflated and demonstrate subtle bilateral hazy interstitial prominence compatible patient's known scleroderma and not significantly changed. No focal lobar consolidation or effusion. Cardiomediastinal silhouette and remainder of the exam is unchanged. IMPRESSION: 1.  No acute cardiopulmonary disease. 2. Subtle bilateral hazy interstitial prominence compatible with patient's known scleroderma. Electronically Signed   By: Marin Olp M.D.   On: 01/13/2022 14:00    Procedures .Marland KitchenIncision and Drainage  Date/Time: 01/13/2022 11:09 PM  Performed by: Anselmo Pickler, PA-C Authorized by: Anselmo Pickler, PA-C   Consent:    Consent obtained:  Verbal   Consent given by:  Patient   Risks, benefits, and alternatives were discussed: yes     Risks discussed:  Bleeding, incomplete drainage, infection, pain and damage to other organs   Alternatives discussed:  No treatment Universal protocol:    Procedure explained and questions answered to patient or proxy's satisfaction: yes     Patient identity confirmed:  Verbally with patient Location:    Type:  Bartholin cyst   Size:  4cm   Location:  Anogenital   Anogenital location:  Bartholin's gland Pre-procedure details:    Skin preparation:  Povidone-iodine Anesthesia:    Anesthesia method:  Local infiltration   Local anesthetic:  Lidocaine 1% w/o epi Procedure type:    Complexity:  Simple Procedure details:    Ultrasound guidance: no     Incision types:  Single straight   Incision depth:  Submucosal   Wound management:  Probed and deloculated   Drainage:  Bloody and purulent   Drainage amount:  Copious   Wound treatment:  Wound left open (Tempted Word catheter placement but placement was unsuccessful at this time) Post-procedure details:    Procedure completion:  Tolerated     Medications Ordered in ED Medications  ondansetron (ZOFRAN)  injection 4 mg (4 mg Intravenous Given 01/13/22 2013)  nitroGLYCERIN (NITROSTAT) SL tablet 0.4 mg (has no administration in time range)  lidocaine (PF) (XYLOCAINE) 1 % injection 30 mL (has no administration in time range)  HYDROmorphone (DILAUDID) injection 1 mg (1 mg Intravenous Not Given 01/13/22 2259)  potassium chloride (KLOR-CON) packet 40 mEq (40 mEq Oral Given 01/13/22 2226)  morphine (PF) 4 MG/ML injection 4 mg (4 mg Intravenous Given 01/13/22 2008)  doxycycline (VIBRA-TABS) tablet 100 mg (100 mg Oral Given 01/13/22 2238)    ED Course/ Medical Decision Making/ A&P                           Medical Decision Making Amount and/or Complexity of Data Reviewed Labs: ordered. Radiology: ordered.  Risk Prescription drug management.   This patient is a 51 y.o. female who presents to the ED for concern of chest pain, left sided groin pain, this involves an extensive number of treatment options, and is a complaint that carries with it a high risk of complications and morbidity. The emergent differential diagnosis prior to evaluation includes, but is not limited to,  ACS, AAS, PE, Mallory-Weiss, Boerhaave's, Pneumonia, acute bronchitis, asthma or COPD exacerbation, anxiety, MSK pain or traumatic injury to the chest, acid reflux, complication related to patient's known scleroderma, for her genital lesion concern for abscess, Bartholin gland cyst/abscess, STI, PID, tubo-ovarian abscess, infection, gonorrhea, chlamydia, other.   This is not an exhaustive differential.   Past Medical History / Co-morbidities / Social History: hypertension, interstitial lung disease, scleroderma, congestive heart failure, pulmonary hypertension  Additional history: Chart reviewed. Pertinent results include: Reviewed outpatient pulmonology, cardiology visits for patient's history of scleroderma  Physical Exam: Physical exam performed. The pertinent findings include: No respiratory distress, accessory breath sounds, no  tenderness palpation of the chest wall.  Patient with evidence of Bartholin's gland abscess on left vaginal introitus with large amount of purulent and foul-smelling drainage  Lab Tests: I ordered, and personally interpreted labs.  The pertinent results include: Troponin negative x2 context of intermittent, not active chest pain which is nonexertional  in nature.  CBC unremarkable, BMP notable for potassium 3.0 we will orally replete.   Imaging Studies: I ordered imaging studies including plain film chest x-ray. I independently visualized and interpreted imaging which showed subtle haziness compatible with known scleroderma, no evidence of new acute intrathoracic abnormality. I agree with the radiologist interpretation.   Cardiac Monitoring:  The patient was maintained on a cardiac monitor.  My attending physician Dr. Roderic Palau viewed and interpreted the cardiac monitored which showed an underlying rhythm of: Normal sinus rhythm, nonspecific T wave abnormality, no other evidence of acute ischemia. I agree with this interpretation.   Medications: I ordered medication including doxycycline, potassium chloride, morphine, Zofran for pain, nausea, Bartholin gland abscess. Reevaluation of the patient after these medicines showed that the patient improved. I have reviewed the patients home medicines and have made adjustments as needed.     Disposition: After consideration of the diagnostic results and the patients response to treatment, I feel that Patient is stable for discharge at this time, previous pain may be related to musculoskeletal pain related to her scleroderma.  I do not see any evidence of acute ACS or other ischemic changes, patient is chest pain-free at time of discharge. encouraged cardiology, pulmonology follow-up, and continue monitoring of intermittent chest pain, if she has significant return of chest pain recommend return to the emergency department.  For her Bartholin gland abscess  recommended close gynecologic follow-up and discussed return precautions, will discharge with doxycycline prescription for antibiotic coverage, encourage sitz bath's, refraining from vaginal intercourse. .   emergency department workup does not suggest an emergent condition requiring admission or immediate intervention beyond what has been performed at this time. The plan is: as above. The patient is safe for discharge and has been instructed to return immediately for worsening symptoms, change in symptoms or any other concerns.  I discussed this case with my attending physician Dr. Roderic Palau who cosigned this note including patient's presenting symptoms, physical exam, and planned diagnostics and interventions. Attending physician stated agreement with plan or made changes to plan which were implemented.    Final Clinical Impression(s) / ED Diagnoses Final diagnoses:  Chest pain, unspecified type  Bartholin's gland abscess    Rx / DC Orders ED Discharge Orders          Ordered    doxycycline (VIBRAMYCIN) 100 MG capsule  2 times daily        01/13/22 2216    oxyCODONE-acetaminophen (PERCOCET/ROXICET) 5-325 MG tablet  Every 6 hours PRN        01/13/22 2216              Sharmarke Cicio, Joesph Fillers, PA-C 01/13/22 2318    Milton Ferguson, MD 01/14/22 1215

## 2022-01-13 NOTE — ED Notes (Signed)
IV access will be delayed. Difficulty finding suitable place for access.

## 2022-01-13 NOTE — Discharge Instructions (Addendum)
Please use Tylenol or ibuprofen for pain.  You may use 600 mg ibuprofen every 6 hours or 1000 mg of Tylenol every 6 hours.  You may choose to alternate between the 2.  This would be most effective.  Not to exceed 4 g of Tylenol within 24 hours.  Not to exceed 3200 mg ibuprofen 24 hours.  You can use the stronger pain medication above as needed for severe breakthrough pain.  Please take the entire course of antibiotics that prescribed.  I have attached instructions on some Mccullough sitz bath, recommend that you use sits baths for the next 2 to 3 days, gently wash the vaginal area with soap and water, and follow-up with your OB/GYN early next week for follow-up and further evaluation.  On this antibiotic recommend that you take the pills on a full stomach, and avoid the sun while you are taking.  Please follow-up with your PCP, and return to the emergency department if you have worsening chest pain, shortness of breath.

## 2022-01-16 ENCOUNTER — Telehealth (HOSPITAL_COMMUNITY): Payer: Self-pay

## 2022-01-16 NOTE — Telephone Encounter (Signed)
error 

## 2022-01-17 ENCOUNTER — Other Ambulatory Visit: Payer: Self-pay | Admitting: Pulmonary Disease

## 2022-01-17 ENCOUNTER — Other Ambulatory Visit (HOSPITAL_COMMUNITY): Payer: Self-pay

## 2022-01-17 DIAGNOSIS — M349 Systemic sclerosis, unspecified: Secondary | ICD-10-CM

## 2022-01-17 DIAGNOSIS — J849 Interstitial pulmonary disease, unspecified: Secondary | ICD-10-CM

## 2022-01-18 ENCOUNTER — Other Ambulatory Visit (HOSPITAL_COMMUNITY): Payer: Self-pay

## 2022-01-18 MED ORDER — MYCOPHENOLATE MOFETIL 500 MG PO TABS
ORAL_TABLET | ORAL | 1 refills | Status: DC
  Filled 2022-01-18 (×2): qty 540, 90d supply, fill #0
  Filled 2022-06-02 – 2022-09-29 (×4): qty 540, 90d supply, fill #1

## 2022-01-19 ENCOUNTER — Other Ambulatory Visit (HOSPITAL_COMMUNITY): Payer: Self-pay

## 2022-01-19 DIAGNOSIS — N751 Abscess of Bartholin's gland: Secondary | ICD-10-CM | POA: Diagnosis not present

## 2022-01-31 ENCOUNTER — Ambulatory Visit: Payer: 59 | Admitting: Internal Medicine

## 2022-01-31 ENCOUNTER — Encounter: Payer: Self-pay | Admitting: Internal Medicine

## 2022-01-31 VITALS — BP 124/86 | Resp 18 | Ht 64.0 in | Wt 133.0 lb

## 2022-01-31 DIAGNOSIS — J849 Interstitial pulmonary disease, unspecified: Secondary | ICD-10-CM

## 2022-01-31 DIAGNOSIS — M349 Systemic sclerosis, unspecified: Secondary | ICD-10-CM

## 2022-01-31 MED ORDER — PREDNISONE 10 MG (21) PO TBPK: ORAL_TABLET | ORAL | 0 refills | Status: DC

## 2022-01-31 NOTE — Assessment & Plan Note (Signed)
Followed by Dr. Vaughan Browner On Dickey Gave currently Has had flash pulmonary edema and pulmonary hypertension from it, followed by CHF clinic - but currently appears euvolemic  Her dyspnea and myalgias are likely due to flareup of her scleroderma - started Sterapred

## 2022-01-31 NOTE — Progress Notes (Signed)
Acute Office Visit  Subjective:    Patient ID: Karen [name], female    DOB: 1970-11-13, 51 y.o.   MRN: [id number]  Chief Complaint  Patient presents with   Acute Visit    Patient feeling weak sob legs ache has been doing this since 01-17-22    HPI Patient is in today for complaint of exertional dyspnea, generalized weakness and myalgias, especially in her thighs, for the last 2-3 weeks.  She denies any fever or chills recently.  Denies any recent worsening of cough or hemoptysis.  She also reports recent worsening of her back pain. She has history of scleroderma and ILD.  She denies any recent change in her medication regimen.  She has been taking her medications regularly.   Past Medical History:  Diagnosis Date   Acute respiratory failure with hypoxia due to flash pulmonary edema requiring intubation 02/13/2020   Calculus of gallbladder with acute cholecystitis without obstruction    CHF (congestive heart failure) (HCC)    GERD (gastroesophageal reflux disease)    Hypertension    Hypothyroidism    ILD (interstitial lung disease) ([place]) 02/08/2018   Interstitial lung disease ([place])    Multinodular thyroid    benign FNA 08/2017.   Perimenopause 02/27/2019   Pulmonary edema    Scleroderma ([place])    Status post laparoscopic cholecystectomy 02/13/20 02/13/2020   Vitamin D deficiency disease 02/27/2019    Past Surgical History:  Procedure Laterality Date   ABDOMINAL HERNIA REPAIR     BUNIONECTOMY Bilateral 10/2018   CHOLECYSTECTOMY N/A 02/13/2020   Procedure: LAPAROSCOPIC CHOLECYSTECTOMY;  Surgeon: Aviva Signs, MD;  Location: [facility];  Service: General;  Laterality: N/A;   COLONOSCOPY WITH PROPOFOL N/A 01/02/2019   Procedure: COLONOSCOPY WITH PROPOFOL;  Surgeon: Daneil Dolin, MD;  Location: [facility];  Service: Endoscopy;  Laterality: N/A;  12:30pm   ESOPHAGOGASTRODUODENOSCOPY (EGD) WITH PROPOFOL N/A 01/28/2018   erosive reflux esophagitis, patulous EG [place]unction, no dilation,  incomplete EGD due to retained food in stomach. GES thereafter with delayed gastric emptying.    RIGHT HEART CATH N/A 09/27/2017   Procedure: RIGHT HEART CATH;  Surgeon: Jolaine Artist, MD;  Location: [facility];  Service: Cardiovascular;  Laterality: N/A;   RIGHT HEART CATH N/A 09/05/2019   Procedure: RIGHT HEART CATH;  Surgeon: Jolaine Artist, MD;  Location: [place];  Service: Cardiovascular;  Laterality: N/A;   RIGHT/LEFT HEART CATH AND CORONARY ANGIOGRAPHY N/A 08/18/2021   Procedure: RIGHT/LEFT HEART CATH AND CORONARY ANGIOGRAPHY;  Surgeon: Jolaine Artist, MD;  Location: [facility];  Service: Cardiovascular;  Laterality: N/A;   UTERINE FIBROID SURGERY      Family History  Problem Relation Age of Onset   Hypertension Father    Hypertension Sister    Hypertension Brother    Diabetes Maternal Grandfather    Diabetes Maternal Uncle    Diabetes Mother    Colon cancer Neg Hx     Social History   Socioeconomic History   Marital status: Single    Spouse name: Not on file   Number of children: 0   Years of education: Not on file   Highest education level: Associate degree: occupational, Hotel manager, or vocational program  Occupational History   Occupation: CMA    Employer: [employer]  Tobacco Use   Smoking status: Never   Smokeless tobacco: Never  Vaping Use   Vaping Use: Former  Substance and Sexual Activity   Alcohol  use: No   Drug use: No   Sexual activity: Yes  Other Topics Concern   Not on file  Social History Narrative   Patient is right-handed. She lives alone in one level home, a few steps to enter.CMA [facility] [employer].   Social Determinants of Health   Financial Resource Strain: Not on file  Food Insecurity: Not on file  Transportation Needs: Not on file  Physical Activity: Not on file  Stress: Not on file  Social Connections: Not on file  Intimate Partner Violence: Not on file    Outpatient Medications  Prior to Visit  Medication Sig Dispense Refill   acetaminophen (TYLENOL) 325 MG tablet Take 2 tablets (650 mg total) by mouth every 6 (six) hours as needed for mild pain, fever or headache (or Fever >/= 101). 12 tablet 0   amLODipine (NORVASC) 10 MG tablet Take 1 tablet (10 mg total) by mouth daily. 30 tablet 6   bisoprolol (ZEBETA) 5 MG tablet Take 1 tablet (5 mg total) by mouth daily. 90 tablet 3   clindamycin-benzoyl peroxide (BENZACLIN) gel Apply 1 Application topically as needed.     clobetasol cream (TEMOVATE) 4.09 % Apply 1 Application topically as needed.     esomeprazole (NEXIUM) 40 MG capsule Take 1 capsule (40 mg total) by mouth 2 (two) times daily. 180 capsule 1   furosemide (LASIX) 40 MG tablet Take 1 tablet (40 mg total) by mouth daily as needed for edema. 30 tablet 6   macitentan (OPSUMIT) 10 MG tablet Take 1 tablet (10 mg total) by mouth daily. 30 tablet 11   Multiple Vitamins-Minerals (HM MULTIVITAMIN ADULT GUMMY PO) Take 2 tablets by mouth daily.     mycophenolate (CELLCEPT) 500 MG tablet Take 3 tablets (1,500 mg total) by mouth every morning AND 3 tablets (1,500 mg total) every evening. 540 tablet 1   Nintedanib (OFEV) 150 MG CAPS Take 1 capsule (150 mg total) by mouth 2 (two) times daily. 60 capsule 4   NP THYROID 120 MG tablet Take 1 tablet (120 mg total) by mouth daily before breakfast. 90 tablet 0   oxyCODONE-acetaminophen (PERCOCET/ROXICET) 5-325 MG tablet Take 1 tablet by mouth every 6 (six) hours as needed for severe pain. 15 tablet 0   potassium chloride 20 MEQ/15ML (10%) SOLN Take 20 mEq by mouth as needed.     spironolactone (ALDACTONE) 25 MG tablet Take 1 tablet (25 mg total) by mouth 2 (two) times daily. 60 tablet 6   tacrolimus (PROTOPIC) 0.1 % ointment Apply to dry skin on face and body daily to twice daily as needed. 60 g 5   triamcinolone cream (KENALOG) 0.1 % Apply 1 Application topically daily as needed.     doxycycline (VIBRAMYCIN) 100 MG capsule Take 1  capsule (100 mg total) by mouth 2 (two) times daily. (Patient not taking: Reported on 01/31/2022) 14 capsule 0   No facility-administered medications prior to visit.    Allergies  Allergen Reactions   Lisinopril Hives, Swelling and Other (See Comments)    Review of Systems  Constitutional:  Positive for fatigue. Negative for chills and fever.  HENT:  Negative for congestion, sinus pressure, sinus pain and sore throat.   Eyes:  Negative for pain and discharge.  Respiratory:  Positive for shortness of breath. Negative for cough.   Cardiovascular:  Negative for chest pain and palpitations.  Gastrointestinal:  Positive for constipation. Negative for abdominal pain, nausea and vomiting.  Endocrine: Positive for heat intolerance. Negative for polydipsia and  polyuria.  Genitourinary:  Negative for dysuria and hematuria.  Musculoskeletal:  Positive for arthralgias and myalgias. Negative for neck pain and neck stiffness.  Skin:  Negative for rash.  Neurological:  Negative for dizziness and weakness.  Psychiatric/Behavioral:  Negative for agitation and behavioral problems.        Objective:    Physical Exam Vitals reviewed.  Constitutional:      General: She is not in acute distress.    Appearance: She is not diaphoretic.  HENT:     Head: Normocephalic and atraumatic.     Nose: Nose normal.     Mouth/Throat:     Mouth: Mucous membranes are moist.  Eyes:     General: No scleral icterus.    Extraocular Movements: Extraocular movements intact.  Cardiovascular:     Rate and Rhythm: Normal rate and regular rhythm.     Pulses: Normal pulses.     Heart sounds: Normal heart sounds. No murmur heard. Pulmonary:     Breath sounds: Normal breath sounds. No wheezing or rales.  Musculoskeletal:     Cervical back: Neck supple. No tenderness.     Right lower leg: No edema.     Left lower leg: No edema.  Skin:    General: Skin is warm.     Findings: No rash.  Neurological:     General: No  focal deficit present.     Mental Status: She is alert and oriented to person, place, and time.     Sensory: No sensory deficit.     Motor: No weakness.  Psychiatric:        Mood and Affect: Mood normal.        Behavior: Behavior normal.     BP 124/86 (BP Location: Right Arm, Patient Position: Sitting, Cuff Size: Normal)   Resp 18   Ht '5\' 4"'$  (1.626 m)   Wt 133 lb (60.3 kg)   BMI 22.83 kg/m  Wt Readings from Last 3 Encounters:  01/31/22 133 lb (60.3 kg)  01/13/22 132 lb (59.9 kg)  12/29/21 136 lb 9.6 oz (62 kg)        Assessment & Plan:   Problem List Items Addressed This Visit       Respiratory   ILD (interstitial lung disease) ([place]) - Primary    Followed by Dr. Vaughan Browner [place] currently Has had flash pulmonary edema and pulmonary hypertension from it, followed by CHF clinic - but currently appears euvolemic  Her dyspnea and myalgias are likely due to flareup of her scleroderma - started Sterapred      Relevant Medications   predniSONE (STERAPRED UNI-PAK 21 TAB) 10 MG (21) TBPK tablet     Other   Scleroderma ([place])    Followed by Dr. Trudie Reed Her dyspnea and myalgias are likely due to flareup of her scleroderma - started Sterapred      Relevant Medications   predniSONE (STERAPRED UNI-PAK 21 TAB) 10 MG (21) TBPK tablet     Meds ordered this encounter  Medications   predniSONE (STERAPRED UNI-PAK 21 TAB) 10 MG (21) TBPK tablet    Sig: Take as package instructions.    Dispense:  1 each    Refill:  0     Jodeci Rini Keith Rake, MD

## 2022-01-31 NOTE — Assessment & Plan Note (Signed)
Followed by Dr. Trudie Reed Her dyspnea and myalgias are likely due to flareup of her scleroderma - started [employer]

## 2022-01-31 NOTE — Patient Instructions (Signed)
Please start taking Prednisone as prescribed.  Please contact Dr Vaughan Browner for your shortness of breath as well.

## 2022-02-01 ENCOUNTER — Telehealth: Payer: Self-pay

## 2022-02-01 NOTE — Telephone Encounter (Signed)
FMLA   Copied Noted Sleeved

## 2022-02-02 NOTE — Telephone Encounter (Signed)
Forms faxed and called patient left voice mail copy of form left at front desk if needs to be picked up

## 2022-02-05 ENCOUNTER — Other Ambulatory Visit: Payer: Self-pay

## 2022-02-05 ENCOUNTER — Emergency Department (HOSPITAL_COMMUNITY): Payer: 59

## 2022-02-05 ENCOUNTER — Emergency Department (HOSPITAL_COMMUNITY)
Admission: EM | Admit: 2022-02-05 | Discharge: 2022-02-05 | Disposition: A | Discharge status: Home / Self Care | Payer: 59 | Attending: Emergency Medicine | Admitting: Emergency Medicine

## 2022-02-05 ENCOUNTER — Encounter (HOSPITAL_COMMUNITY): Payer: Self-pay

## 2022-02-05 DIAGNOSIS — Y92000 Kitchen of unspecified non-institutional (private) residence as  the place of occurrence of the external cause: Secondary | ICD-10-CM | POA: Insufficient documentation

## 2022-02-05 DIAGNOSIS — S60211A Contusion of right wrist, initial encounter: Secondary | ICD-10-CM

## 2022-02-05 DIAGNOSIS — Y93G3 Activity, cooking and baking: Secondary | ICD-10-CM | POA: Diagnosis not present

## 2022-02-05 DIAGNOSIS — S0990XA Unspecified injury of head, initial encounter: Secondary | ICD-10-CM | POA: Diagnosis present

## 2022-02-05 DIAGNOSIS — I10 Essential (primary) hypertension: Secondary | ICD-10-CM | POA: Diagnosis not present

## 2022-02-05 DIAGNOSIS — W01198A Fall on same level from slipping, tripping and stumbling with subsequent striking against other object, initial encounter: Secondary | ICD-10-CM | POA: Diagnosis not present

## 2022-02-05 DIAGNOSIS — R519 Headache, unspecified: Secondary | ICD-10-CM | POA: Diagnosis not present

## 2022-02-05 DIAGNOSIS — S0083XA Contusion of other part of head, initial encounter: Secondary | ICD-10-CM

## 2022-02-05 DIAGNOSIS — M25531 Pain in right wrist: Secondary | ICD-10-CM | POA: Diagnosis not present

## 2022-02-05 DIAGNOSIS — W010XXA Fall on same level from slipping, tripping and stumbling without subsequent striking against object, initial encounter: Secondary | ICD-10-CM

## 2022-02-05 DIAGNOSIS — M542 Cervicalgia: Secondary | ICD-10-CM | POA: Diagnosis not present

## 2022-02-05 DIAGNOSIS — M961 Postlaminectomy syndrome, not elsewhere classified: Secondary | ICD-10-CM | POA: Diagnosis not present

## 2022-02-05 DIAGNOSIS — Z79899 Other long term (current) drug therapy: Secondary | ICD-10-CM | POA: Diagnosis not present

## 2022-02-05 DIAGNOSIS — R531 Weakness: Secondary | ICD-10-CM | POA: Diagnosis not present

## 2022-02-05 MED ORDER — ACETAMINOPHEN 325 MG PO TABS
650.0000 mg | ORAL_TABLET | Freq: Once | ORAL | Status: AC
  Administered 2022-02-05: 650 mg via ORAL
  Filled 2022-02-05: qty 2

## 2022-02-05 NOTE — ED Notes (Signed)
Patient ambulated to the bathroom.

## 2022-02-05 NOTE — Discharge Instructions (Signed)
It was our pleasure to provide your ER care today - we hope that you feel better.  Fall precautions.  Drink plenty of fluids/stay well hydrated. Take acetaminophen as need.  Icepack to sore area.  Follow up closely with primary care doctor in one week - also have your blood pressure rechecked then as it is high today.  Return to ER if worse, new symptoms, fevers, new/severe pain, weak/fainting, or other concern.

## 2022-02-05 NOTE — ED Provider Notes (Signed)
[facility] EMERGENCY DEPARTMENT Provider Note   CSN: [id number] Arrival date & time: 02/05/22  1856     History  Chief Complaint  Patient presents with   [name]    Karen [name] is a 51 y.o. female.  Pt s/p fall at home in kitchen this afternoon, indicates slipped. Hit head/forehead. Dull pain to area. Also w contusion/pain to right wrist. Felt fine, asymptomatic prior to fall - no faintness, no syncope, no severe headaches.  Denies neck/back pain. No chest pain or sob. No abd pain or nv. Skin intact. Denies other extremity pain or injury. No anticoagulant use.   The history is provided by the patient, a relative and medical records.  Fall Pertinent negatives include no chest pain, no abdominal pain and no shortness of breath.       Home Medications Prior to Admission medications   Medication Sig Start Date End Date Taking? Authorizing Provider  acetaminophen (TYLENOL) 325 MG tablet Take 2 tablets (650 mg total) by mouth every 6 (six) hours as needed for mild pain, fever or headache (or Fever >/= 101). 02/15/20   Roxan Hockey, MD  amLODipine (NORVASC) 10 MG tablet Take 1 tablet (10 mg total) by mouth daily. 07/21/21   Bensimhon, Shaune Pascal, MD  bisoprolol (ZEBETA) 5 MG tablet Take 1 tablet (5 mg total) by mouth daily. 09/27/21   Bensimhon, Shaune Pascal, MD  clindamycin-benzoyl peroxide (BENZACLIN) gel Apply 1 Application topically as needed.    [provider]  clobetasol cream (TEMOVATE) 7.86 % Apply 1 Application topically as needed.    [provider]  esomeprazole (NEXIUM) 40 MG capsule Take 1 capsule (40 mg total) by mouth 2 (two) times daily. 03/07/21   Erenest Rasher, PA-C  furosemide (LASIX) 40 MG tablet Take 1 tablet (40 mg total) by mouth daily as needed for edema. 07/21/21   Bensimhon, Shaune Pascal, MD  macitentan (OPSUMIT) 10 MG tablet Take 1 tablet (10 mg total) by mouth daily. 11/22/21   Bensimhon, Shaune Pascal, MD  Multiple Vitamins-Minerals (HM  MULTIVITAMIN ADULT GUMMY PO) Take 2 tablets by mouth daily.    [provider]  mycophenolate (CELLCEPT) 500 MG tablet Take 3 tablets (1,500 mg total) by mouth every morning AND 3 tablets (1,500 mg total) every evening. 01/18/22   Mannam, Hart Robinsons, MD  Nintedanib (OFEV) 150 MG CAPS Take 1 capsule (150 mg total) by mouth 2 (two) times daily. 01/20/21   Marshell Garfinkel, MD  NP THYROID 120 MG tablet Take 1 tablet (120 mg total) by mouth daily before breakfast. 08/04/21   Lindell Spar, MD  oxyCODONE-acetaminophen (PERCOCET/ROXICET) 5-325 MG tablet Take 1 tablet by mouth every 6 (six) hours as needed for severe pain. 01/13/22   Prosperi, Christian H, PA-C  potassium chloride 20 MEQ/15ML (10%) SOLN Take 20 mEq by mouth as needed.    [provider]  predniSONE (STERAPRED UNI-PAK 21 TAB) 10 MG (21) TBPK tablet Take as package instructions. 01/31/22   Lindell Spar, MD  spironolactone (ALDACTONE) 25 MG tablet Take 1 tablet (25 mg total) by mouth 2 (two) times daily. 07/21/21   Bensimhon, Shaune Pascal, MD  tacrolimus (PROTOPIC) 0.1 % ointment Apply to dry skin on face and body daily to twice daily as needed. 05/18/21     triamcinolone cream (KENALOG) 0.1 % Apply 1 Application topically daily as needed.    [provider]  PARoxetine (PAXIL) 10 MG tablet Take 1 tablet (10 mg total) by mouth daily as directed 10/07/20  12/02/20        Allergies    Lisinopril    Review of Systems   Review of Systems  Constitutional:  Negative for fever.  HENT:  Negative for nosebleeds.   Eyes:  Negative for visual disturbance.  Respiratory:  Negative for shortness of breath.   Cardiovascular:  Negative for chest pain, palpitations and leg swelling.  Gastrointestinal:  Negative for abdominal pain, blood in stool, diarrhea and vomiting.  Genitourinary:  Negative for dysuria and flank pain.  Musculoskeletal:  Negative for back pain and neck pain.  Skin:  Negative for wound.  Neurological:  Negative for  weakness and numbness.       Contusion to head  Hematological:  Does not bruise/bleed easily.  Psychiatric/Behavioral:  Negative for confusion.     Physical Exam Updated Vital Signs BP (!) 142/104 (BP Location: Left Arm)   Pulse 96   Temp 98.3 F (36.8 C) (Oral)   Resp 15   Ht 1.626 m ('5\' 4"'$ )   Wt 60.3 kg   SpO2 100%   BMI 22.83 kg/m  Physical Exam Vitals and nursing note reviewed.  Constitutional:      Appearance: Normal appearance. She is well-developed.  HENT:     Head: Atraumatic.     Nose: Nose normal.     Mouth/Throat:     Mouth: Mucous membranes are moist.  Eyes:     General: No scleral icterus.    Extraocular Movements: Extraocular movements intact.     Conjunctiva/sclera: Conjunctivae normal.     Pupils: Pupils are equal, round, and reactive to light.  Neck:     Trachea: No tracheal deviation.  Cardiovascular:     Rate and Rhythm: Normal rate and regular rhythm.     Pulses: Normal pulses.     Heart sounds: Normal heart sounds. No murmur heard.    No friction rub. No gallop.  Pulmonary:     Effort: Pulmonary effort is normal. No respiratory distress.     Breath sounds: Normal breath sounds.  Chest:     Chest wall: No tenderness.  Abdominal:     General: Bowel sounds are normal. There is no distension.     Palpations: Abdomen is soft. There is no mass.     Tenderness: There is no abdominal tenderness.  Genitourinary:    Comments: No cva tenderness.  Musculoskeletal:        General: No swelling.     Cervical back: Normal range of motion and neck supple. No rigidity. No muscular tenderness.     Right lower leg: No edema.     Left lower leg: No edema.     Comments: Mid cervical tenderness, mild, otherwise CTLS spine, non tender, aligned, no step off. Tenderness right wrist without focal scaphoid pain/tenderness, otherwise good rom bil extremities without pain or other focal bony tenderness.    Skin:    General: Skin is warm and dry.     Findings: No  rash.  Neurological:     Mental Status: She is alert.     Comments: Alert, speech normal.  GCS 15. Motor/sens grossly intact bil.   Psychiatric:        Mood and Affect: Mood normal.     ED Results / Procedures / Treatments   Labs (all labs ordered are listed, but only abnormal results are displayed) Labs Reviewed - No data to display  EKG None  Radiology CT Head Wo Contrast  Result Date: 02/05/2022 CLINICAL DATA:  Fall; hit  head; pain and weakness to bilateral arms and legs, headache EXAM: CT HEAD WITHOUT CONTRAST CT CERVICAL SPINE WITHOUT CONTRAST TECHNIQUE: Multidetector CT imaging of the head and cervical spine was performed following the standard protocol without intravenous contrast. Multiplanar CT image reconstructions of the cervical spine were also generated. RADIATION DOSE REDUCTION: This exam was performed according to the departmental dose-optimization program which includes automated exposure control, adjustment of the mA and/or kV according to patient size and/or use of iterative reconstruction technique. COMPARISON:  06/11/2021 CT head, no prior CT cervical spine FINDINGS: CT HEAD FINDINGS Brain: No evidence of acute infarct, hemorrhage, mass, mass effect, or midline shift. No hydrocephalus or extra-axial fluid collection. Vascular: No hyperdense vessel. Skull: Normal. Negative for fracture or focal lesion. Sinuses/Orbits: No acute finding. Other: The mastoid air cells are well aerated. CT CERVICAL SPINE FINDINGS Alignment: Mild straightening of the normal cervical lordosis. No listhesis. Skull base and vertebrae: No acute fracture. No primary bone lesion or focal pathologic process. Soft tissues and spinal canal: No prevertebral fluid or swelling. No visible canal hematoma. Disc levels: Minimal degenerative changes. No spinal canal stenosis. Upper chest: Negative. Other: Multiple hypoenhancing nodules in the thyroid, which were most recently evaluated by ultrasound on 08/25/2021.  IMPRESSION: 1. No acute intracranial process. 2. No acute fracture or traumatic listhesis of the cervical spine. Electronically Signed   By: Merilyn Baba M.D.   On: 02/05/2022 21:56   CT Cervical Spine Wo Contrast  Result Date: 02/05/2022 CLINICAL DATA:  Fall; hit head; pain and weakness to bilateral arms and legs, headache EXAM: CT HEAD WITHOUT CONTRAST CT CERVICAL SPINE WITHOUT CONTRAST TECHNIQUE: Multidetector CT imaging of the head and cervical spine was performed following the standard protocol without intravenous contrast. Multiplanar CT image reconstructions of the cervical spine were also generated. RADIATION DOSE REDUCTION: This exam was performed according to the departmental dose-optimization program which includes automated exposure control, adjustment of the mA and/or kV according to patient size and/or use of iterative reconstruction technique. COMPARISON:  06/11/2021 CT head, no prior CT cervical spine FINDINGS: CT HEAD FINDINGS Brain: No evidence of acute infarct, hemorrhage, mass, mass effect, or midline shift. No hydrocephalus or extra-axial fluid collection. Vascular: No hyperdense vessel. Skull: Normal. Negative for fracture or focal lesion. Sinuses/Orbits: No acute finding. Other: The mastoid air cells are well aerated. CT CERVICAL SPINE FINDINGS Alignment: Mild straightening of the normal cervical lordosis. No listhesis. Skull base and vertebrae: No acute fracture. No primary bone lesion or focal pathologic process. Soft tissues and spinal canal: No prevertebral fluid or swelling. No visible canal hematoma. Disc levels: Minimal degenerative changes. No spinal canal stenosis. Upper chest: Negative. Other: Multiple hypoenhancing nodules in the thyroid, which were most recently evaluated by ultrasound on 08/25/2021. IMPRESSION: 1. No acute intracranial process. 2. No acute fracture or traumatic listhesis of the cervical spine. Electronically Signed   By: Merilyn Baba M.D.   On: 02/05/2022  21:56   DG Wrist Complete Right  Result Date: 02/05/2022 CLINICAL DATA:  Fall with right wrist pain. EXAM: RIGHT WRIST - COMPLETE 3+ VIEW COMPARISON:  None Available. FINDINGS: There is no evidence of fracture or dislocation. There is no evidence of arthropathy or other focal bone abnormality. Soft tissue swelling is seen in the region of the ulnar styloid. IMPRESSION: No acute fracture or dislocation. Electronically Signed   By: Brett Fairy M.D.   On: 02/05/2022 20:33    Procedures Procedures    Medications Ordered in ED Medications - No  data to display  ED Course/ Medical Decision Making/ A&P                           Medical Decision Making Problems Addressed: Contusion of forehead, initial encounter: acute illness or injury with systemic symptoms that poses a threat to life or bodily functions Contusion of right wrist, initial encounter: acute illness or injury Essential hypertension: chronic illness or injury with exacerbation, progression, or side effects of treatment that poses a threat to life or bodily functions Fall from slip, trip, or stumble, initial encounter: acute illness or injury with systemic symptoms that poses a threat to life or bodily functions  Amount and/or Complexity of Data Reviewed Independent Historian:     Details: Family, hx External Data Reviewed: notes. Radiology: ordered and independent interpretation performed. Decision-making details documented in ED Course.  Risk Decision regarding hospitalization.   Imaging ordered.  Diff dx includes ich/sdh, head contusion, wrist sprain, wrist fx, etc - dispo decision including potential need for admission if abn ct considered - will get imaging and reassess.   Reviewed nursing notes and prior charts for additional history.   CT reviewed/interpreted by me - no hem.  Xrays reviewed/interpreted by me - no fx.   Po fluids/food, ambulate in hall. Icepack to sore area.   Pt currently appears stable for  d/c.   Rec close pcp f/u.  Return precautions provided.           Final Clinical Impression(s) / ED Diagnoses Final diagnoses:  None    Rx / DC Orders ED Discharge Orders     None         Lajean Saver, MD 02/05/22 2214

## 2022-02-05 NOTE — ED Triage Notes (Signed)
Pt from home c/o falling in kitchen, tripped while cooking, hit forehead on floor and abrasion noted to right knee. Pt was given oxycodone from Rx earlier this month, pt drowsy in triage. Alert and oriented x 4. Denies LOC. No active bleeding. Small knot noted to forehead.

## 2022-02-05 NOTE — ED Notes (Signed)
Patient returned from CT

## 2022-02-05 NOTE — ED Notes (Signed)
Patient transported to CT 

## 2022-02-07 ENCOUNTER — Telehealth: Payer: Self-pay | Admitting: Internal Medicine

## 2022-02-07 NOTE — Telephone Encounter (Signed)
FMLA   Noted  Copied  Sleeved   Original & copy in brown folders   Called pt to schedule appt.

## 2022-02-09 NOTE — Telephone Encounter (Signed)
Called pt 2x LVM to schedule appt for [facility]

## 2022-02-10 DIAGNOSIS — J849 Interstitial pulmonary disease, unspecified: Secondary | ICD-10-CM | POA: Diagnosis not present

## 2022-02-10 DIAGNOSIS — M349 Systemic sclerosis, unspecified: Secondary | ICD-10-CM | POA: Diagnosis not present

## 2022-02-10 DIAGNOSIS — T8484XA Pain due to internal orthopedic prosthetic devices, implants and grafts, initial encounter: Secondary | ICD-10-CM | POA: Diagnosis not present

## 2022-02-10 DIAGNOSIS — M21612 Bunion of left foot: Secondary | ICD-10-CM | POA: Diagnosis not present

## 2022-02-10 DIAGNOSIS — M6702 Short Achilles tendon (acquired), left ankle: Secondary | ICD-10-CM | POA: Diagnosis not present

## 2022-02-10 DIAGNOSIS — M79672 Pain in left foot: Secondary | ICD-10-CM | POA: Diagnosis not present

## 2022-02-10 DIAGNOSIS — M7742 Metatarsalgia, left foot: Secondary | ICD-10-CM | POA: Diagnosis not present

## 2022-02-10 DIAGNOSIS — M79671 Pain in right foot: Secondary | ICD-10-CM | POA: Diagnosis not present

## 2022-02-14 ENCOUNTER — Telehealth: Payer: Self-pay | Admitting: Pulmonary Disease

## 2022-02-14 NOTE — Telephone Encounter (Signed)
Fax received from Dr. Wylene Simmer with [facility] to perform a Left foot akin.weil osteotomy and gastric recession on patient.  Patient needs surgery clearance. Surgery is PENDING. Patient was seen on 12/29/2021. Office protocol is a risk assessment can be sent to surgeon if patient has been seen in 60 days or less.   Sending to Dr Vaughan Browner for risk assessment or recommendations if patient needs to be seen in office prior to surgical procedure.    Patient has office visit with Dr Vaughan Browner on 02/23/2022

## 2022-02-16 ENCOUNTER — Encounter: Payer: Self-pay | Admitting: Internal Medicine

## 2022-02-16 ENCOUNTER — Ambulatory Visit: Payer: 59 | Admitting: Internal Medicine

## 2022-02-16 VITALS — BP 134/86 | Resp 18 | Ht 65.0 in | Wt 132.6 lb

## 2022-02-16 DIAGNOSIS — M349 Systemic sclerosis, unspecified: Secondary | ICD-10-CM

## 2022-02-16 DIAGNOSIS — W19XXXD Unspecified fall, subsequent encounter: Secondary | ICD-10-CM | POA: Diagnosis not present

## 2022-02-16 DIAGNOSIS — J849 Interstitial pulmonary disease, unspecified: Secondary | ICD-10-CM | POA: Diagnosis not present

## 2022-02-16 DIAGNOSIS — M25531 Pain in right wrist: Secondary | ICD-10-CM

## 2022-02-16 DIAGNOSIS — Z09 Encounter for follow-up examination after completed treatment for conditions other than malignant neoplasm: Secondary | ICD-10-CM | POA: Diagnosis not present

## 2022-02-16 NOTE — Patient Instructions (Signed)
Please continue taking medications as prescribed.  Okay to take Tylenol for pain or swelling.

## 2022-02-16 NOTE — Progress Notes (Signed)
Acute Office Visit  Subjective:    Patient ID: Karen [name], female    DOB: 1970/10/22, 51 y.o.   MRN: [id number]  Chief Complaint  Patient presents with   Fall    Patient had fall 02-05-22 she fell in kitchen tripped up went to ER 10/1 she is still sore     HPI Patient is in today for evaluation after an ER visit for a fall at home.  She fell in her kitchen due to a rug on the floor, and hit her right side of the head, right wrist and right knee.  She had bruising over right forehead and right wrist.  CT of the head and cervical spine was negative for any acute hematoma or fracture.  X-ray of the wrist was negative for any acute fracture or dislocation.  Her bruising over the head has resolved now.  She still has mild right wrist pain, but has been improving now.  She had abrasion over right knee, which has also been improving.  She was not able to work for the 2 days after the fall.  She denies any prodromal symptoms before the fall.  Denies LOC.    Past Medical History:  Diagnosis Date   Acute respiratory failure with hypoxia due to flash pulmonary edema requiring intubation 02/13/2020   Calculus of gallbladder with acute cholecystitis without obstruction    CHF (congestive heart failure) (HCC)    GERD (gastroesophageal reflux disease)    Hypertension    Hypothyroidism    ILD (interstitial lung disease) ([place]) 02/08/2018   Interstitial lung disease ([place])    Multinodular thyroid    benign FNA 08/2017.   Perimenopause 02/27/2019   Pulmonary edema    Scleroderma ([place])    Status post laparoscopic cholecystectomy 02/13/20 02/13/2020   Vitamin D deficiency disease 02/27/2019    Past Surgical History:  Procedure Laterality Date   ABDOMINAL HERNIA REPAIR     BUNIONECTOMY Bilateral 10/2018   CHOLECYSTECTOMY N/A 02/13/2020   Procedure: LAPAROSCOPIC CHOLECYSTECTOMY;  Surgeon: Aviva Signs, MD;  Location: [facility];  Service: General;  Laterality: N/A;   COLONOSCOPY WITH PROPOFOL N/A  01/02/2019   Procedure: COLONOSCOPY WITH PROPOFOL;  Surgeon: Daneil Dolin, MD;  Location: [facility];  Service: Endoscopy;  Laterality: N/A;  12:30pm   ESOPHAGOGASTRODUODENOSCOPY (EGD) WITH PROPOFOL N/A 01/28/2018   erosive reflux esophagitis, patulous EG Junction, no dilation, incomplete EGD due to retained food in stomach. GES thereafter with delayed gastric emptying.    RIGHT HEART CATH N/A 09/27/2017   Procedure: RIGHT HEART CATH;  Surgeon: Jolaine Artist, MD;  Location: [place] [facility];  Service: Cardiovascular;  Laterality: N/A;   RIGHT HEART CATH N/A 09/05/2019   Procedure: RIGHT HEART CATH;  Surgeon: Jolaine Artist, MD;  Location: [place] [facility];  Service: Cardiovascular;  Laterality: N/A;   RIGHT/LEFT HEART CATH AND CORONARY ANGIOGRAPHY N/A 08/18/2021   Procedure: RIGHT/LEFT HEART CATH AND CORONARY ANGIOGRAPHY;  Surgeon: Jolaine Artist, MD;  Location: [place] [facility];  Service: Cardiovascular;  Laterality: N/A;   UTERINE FIBROID SURGERY      Family History  Problem Relation Age of Onset   Hypertension Father    Hypertension Sister    Hypertension Brother    Diabetes Maternal Grandfather    Diabetes Maternal Uncle    Diabetes Mother    Colon cancer Neg Hx     Social History   Socioeconomic History   Marital status: Single    Spouse  name: Not on file   Number of children: 0   Years of education: Not on file   Highest education level: Associate degree: occupational, Hotel manager, or vocational program  Occupational History   Occupation: CMA    Employer: [employer]  Tobacco Use   Smoking status: Never   Smokeless tobacco: Never  Vaping Use   Vaping Use: Former  Substance and Sexual Activity   Alcohol use: No   Drug use: No   Sexual activity: Yes  Other Topics Concern   Not on file  Social History Narrative   Patient is right-handed. She lives alone in one level home, a few steps to enter.CMA [facility].   Social  Determinants of Health   Financial Resource Strain: Not on file  Food Insecurity: Not on file  Transportation Needs: Not on file  Physical Activity: Not on file  Stress: Not on file  Social Connections: Not on file  Intimate Partner Violence: Not on file    Outpatient Medications Prior to Visit  Medication Sig Dispense Refill   acetaminophen (TYLENOL) 325 MG tablet Take 2 tablets (650 mg total) by mouth every 6 (six) hours as needed for mild pain, fever or headache (or Fever >/= 101). 12 tablet 0   amLODipine (NORVASC) 10 MG tablet Take 1 tablet (10 mg total) by mouth daily. 30 tablet 6   bisoprolol (ZEBETA) 5 MG tablet Take 1 tablet (5 mg total) by mouth daily. 90 tablet 3   clindamycin-benzoyl peroxide (BENZACLIN) gel Apply 1 Application topically as needed.     clobetasol cream (TEMOVATE) 6.38 % Apply 1 Application topically as needed.     esomeprazole (NEXIUM) 40 MG capsule Take 1 capsule (40 mg total) by mouth 2 (two) times daily. 180 capsule 1   furosemide (LASIX) 40 MG tablet Take 1 tablet (40 mg total) by mouth daily as needed for edema. 30 tablet 6   macitentan (OPSUMIT) 10 MG tablet Take 1 tablet (10 mg total) by mouth daily. 30 tablet 11   Multiple Vitamins-Minerals (HM MULTIVITAMIN ADULT GUMMY PO) Take 2 tablets by mouth daily.     mycophenolate (CELLCEPT) 500 MG tablet Take 3 tablets (1,500 mg total) by mouth every morning AND 3 tablets (1,500 mg total) every evening. 540 tablet 1   Nintedanib (OFEV) 150 MG CAPS Take 1 capsule (150 mg total) by mouth 2 (two) times daily. 60 capsule 4   NP THYROID 120 MG tablet Take 1 tablet (120 mg total) by mouth daily before breakfast. 90 tablet 0   potassium chloride 20 MEQ/15ML (10%) SOLN Take 20 mEq by mouth as needed.     spironolactone (ALDACTONE) 25 MG tablet Take 1 tablet (25 mg total) by mouth 2 (two) times daily. 60 tablet 6   tacrolimus (PROTOPIC) 0.1 % ointment Apply to dry skin on face and body daily to twice daily as needed. 60  g 5   triamcinolone cream (KENALOG) 0.1 % Apply 1 Application topically daily as needed.     oxyCODONE-acetaminophen (PERCOCET/ROXICET) 5-325 MG tablet Take 1 tablet by mouth every 6 (six) hours as needed for severe pain. (Patient not taking: Reported on 02/16/2022) 15 tablet 0   predniSONE (STERAPRED UNI-PAK 21 TAB) 10 MG (21) TBPK tablet Take as package instructions. (Patient not taking: Reported on 02/16/2022) 1 each 0   No facility-administered medications prior to visit.    Allergies  Allergen Reactions   Lisinopril Hives, Swelling and Other (See Comments)    Review of Systems  Constitutional:  Positive for fatigue. Negative for chills and fever.  HENT:  Negative for congestion, sinus pressure, sinus pain and sore throat.   Eyes:  Negative for pain and discharge.  Respiratory:  Positive for shortness of breath. Negative for cough.   Cardiovascular:  Negative for chest pain and palpitations.  Gastrointestinal:  Positive for constipation. Negative for abdominal pain, nausea and vomiting.  Endocrine: Positive for heat intolerance. Negative for polydipsia and polyuria.  Genitourinary:  Negative for dysuria and hematuria.  Musculoskeletal:  Positive for arthralgias. Negative for neck pain and neck stiffness.  Skin:  Negative for rash.  Neurological:  Negative for dizziness and weakness.  Psychiatric/Behavioral:  Negative for agitation and behavioral problems.        Objective:    Physical Exam Vitals reviewed.  Constitutional:      General: She is not in acute distress.    Appearance: She is not diaphoretic.  HENT:     Head: Normocephalic and atraumatic.     Nose: Nose normal.     Mouth/Throat:     Mouth: Mucous membranes are moist.  Eyes:     General: No scleral icterus.    Extraocular Movements: Extraocular movements intact.  Cardiovascular:     Rate and Rhythm: Normal rate and regular rhythm.     Pulses: Normal pulses.     Heart sounds: Normal heart sounds. No murmur  heard. Pulmonary:     Breath sounds: Normal breath sounds. No wheezing or rales.  Musculoskeletal:     Cervical back: Neck supple. No tenderness.     Right lower leg: No edema.     Left lower leg: No edema.  Skin:    General: Skin is warm.     Findings: No rash.  Neurological:     General: No focal deficit present.     Mental Status: She is alert and oriented to person, place, and time.     Cranial Nerves: No cranial nerve deficit.     Sensory: No sensory deficit.     Motor: No weakness.  Psychiatric:        Mood and Affect: Mood normal.        Behavior: Behavior normal.     BP 134/86 (BP Location: Left Arm, Patient Position: Sitting, Cuff Size: Normal)   Resp 18   Ht '5\' 5"'$  (1.651 m)   Wt 132 lb 9.6 oz (60.1 kg)   BMI 22.07 kg/m  Wt Readings from Last 3 Encounters:  02/16/22 132 lb 9.6 oz (60.1 kg)  02/05/22 133 lb (60.3 kg)  01/31/22 133 lb (60.3 kg)        Assessment & Plan:   Problem List Items Addressed This Visit       Respiratory   ILD (interstitial lung disease) ([place])    Followed by Dr. Vaughan Browner On [facility] currently Has had flash pulmonary edema and pulmonary hypertension from it, followed by CHF clinic - but currently appears euvolemic  Her dyspnea and myalgias are resolved now with Sterapred        Other   Scleroderma ([place])    Followed by Dr. Trudie Reed Her dyspnea and myalgias were likely due to flareup of her scleroderma - completed Sterapred, symptoms improved now       Encounter for examination following treatment at hospital - Primary    ER chart reviewed, including imaging Bruising over right forehead and right wrist have resolved now FMLA paperwork filled out for missed work and intermittent leave for scleroderma and ILD  Other Visit Diagnoses     Fall, subsequent encounter       Right wrist pain     Likely mechanical fall in the absence of prodromal symptoms No LOC CT head and cervical spine negative for any acute hematoma or  fracture Neurologic exam benign today X-ray of right wrist negative for acute fracture or dislocation Right wrist swelling improved now        No orders of the defined types were placed in this encounter.    Lindell Spar, MD

## 2022-02-16 NOTE — Assessment & Plan Note (Signed)
Followed by Dr. Vaughan Browner On [place] currently Has had flash pulmonary edema and pulmonary hypertension from it, followed by CHF clinic - but currently appears euvolemic  Her dyspnea and myalgias are resolved now with Sterapred

## 2022-02-16 NOTE — Assessment & Plan Note (Signed)
ER chart reviewed, including imaging Bruising over right forehead and right wrist have resolved now FMLA paperwork filled out for missed work and intermittent leave for scleroderma and ILD

## 2022-02-16 NOTE — Assessment & Plan Note (Signed)
Followed by Dr. Trudie Reed Her dyspnea and myalgias were likely due to flareup of her scleroderma - completed Sterapred, symptoms improved now

## 2022-02-20 ENCOUNTER — Telehealth: Payer: Self-pay

## 2022-02-20 NOTE — Telephone Encounter (Signed)
error 

## 2022-02-20 NOTE — Telephone Encounter (Signed)
Duplicate per nurse

## 2022-02-20 NOTE — Telephone Encounter (Signed)
Left voice mail forms are ready to be picked up. Faxed forms on 02/20/2022.

## 2022-02-20 NOTE — Telephone Encounter (Signed)
Received another FMLA from [employer]. Continuous.  Copied Noted sleeved

## 2022-02-23 ENCOUNTER — Encounter: Payer: Self-pay | Admitting: Pulmonary Disease

## 2022-02-23 ENCOUNTER — Ambulatory Visit: Payer: 59 | Admitting: Pulmonary Disease

## 2022-02-23 NOTE — Telephone Encounter (Signed)
I have done the pulmonary risk assessment.  Please see separate note in chart.

## 2022-02-23 NOTE — Progress Notes (Signed)
Perioperative pulmonary risk evaluation   Fax received from Dr. Wylene Simmer with [facility] to perform a Left foot akin.weil osteotomy and gastric recession on patient. Request for pulmonary clearance for the patient.  Briefly patient has history of scleroderma associated interstitial lung disease and mild pulmonary hypertension She is currently on treatment with mycophenolate, nintedanib and macitentan.  She has CT showing interstitial lung disease consistent with nonspecific interstitial pneumonia and PFTs with severe restriction and diffusion impairment.  She is at increased intermediate risk of perioperative complications due to interstitial lung disease and mild pulmonary hypertension but no absolute contraindications to surgery if needed.  Peri-operative Assessment of Pulmonary Risk for Non-Thoracic Surgery:  For Ms. Karen [name], risk of perioperative pulmonary complications is increased by:  ILD  Pulmonary Hypertension  Respiratory complications generally occur in 1% of ASA Class I patients, 5% of ASA Class II and 10% of ASA Class III-IV patients These complications rarely result in mortality and iclude postoperative pneumonia, atelectasis, pulmonary embolism, ARDS and increased time requiring postoperative mechanical ventilation.  Overall, I recommend proceeding with the surgery if the risk for respiratory complications are outweighed by the potential benefits. This will need to be discussed between the patient and surgeon.  To reduce risks of respiratory complications, I recommend: --Pre- and post-operative incentive spirometry performed frequently while awake --Avoiding use of pancuronium during anesthesia.  Marshell Garfinkel MD [facility] 02/23/2022, 3:46 PM

## 2022-02-24 NOTE — Telephone Encounter (Signed)
OV notes and clearance form have been faxed back to [facility]. Nothing further needed at this time. ?

## 2022-02-27 ENCOUNTER — Ambulatory Visit: Payer: 59 | Admitting: Family Medicine

## 2022-03-16 ENCOUNTER — Ambulatory Visit (INDEPENDENT_AMBULATORY_CARE_PROVIDER_SITE_OTHER): Payer: 59 | Admitting: Pulmonary Disease

## 2022-03-16 ENCOUNTER — Encounter: Payer: Self-pay | Admitting: Pulmonary Disease

## 2022-03-16 VITALS — BP 144/90 | HR 83 | Temp 97.9°F | Ht 65.0 in | Wt 135.2 lb

## 2022-03-16 DIAGNOSIS — J849 Interstitial pulmonary disease, unspecified: Secondary | ICD-10-CM | POA: Diagnosis not present

## 2022-03-16 DIAGNOSIS — Z5181 Encounter for therapeutic drug level monitoring: Secondary | ICD-10-CM | POA: Diagnosis not present

## 2022-03-16 DIAGNOSIS — M349 Systemic sclerosis, unspecified: Secondary | ICD-10-CM | POA: Diagnosis not present

## 2022-03-16 NOTE — Patient Instructions (Signed)
Glad you are better with your breathing I will get records from Dr. Gavin Pound You have a CT scan scheduled in February Follow-up in clinic in early March 2024 after CT scan

## 2022-03-16 NOTE — Progress Notes (Signed)
Aziya Arena    [id number]    [birth year]  Primary Care Physician:Patel, Colin Broach, MD  Referring Physician: Lindell Spar, MD [address] [place],  [place] [address]  Problem list:  Scleroderma ILD with pulmonary HTN CellCept started May 2019, Ofev started December 2020  Pulmonary hypertension-started macitentan April 2021 Transplant candidate at [facility]  HPI: 51 y.o.  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 Haroldine Laws on 09/27/17 with no evidence of pulmonary hypertension Started on CellCept May 2019 and uptitrated to max dose of 1.5 mg twice daily.  She had a hospitalization in [facility] in March 2019 for hypertension.  A CT chest scan at that time showed subtle lower lung findings of atelectasis, groundglass.  She has a right thyroid nodule which was biopsied on 08/15/17 with pathology showing benign findings.  Thyroid function tests are normal.  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 Finished pulmonary rehab in 2021 and is back at work full-time Repeat right heart cath 2021 by Dr. Haroldine Laws showed mild pulmonary hypertension she has been started on  macitetan  Reviewed note from Dr. Trudie Reed, rheumatology 07/12/2017 Seen for joint pain, Raynaud's, elevated CK, skin thickening Serologies positive for Sjogren's negative,, normal CK and aldolase.  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  No mold. No hot tub, Jacuzzi. She has down comforter.  Smoking history: Never smoker Travel history: Grew up in [place].  Lived in [place] and [place] Relevant family history: No family history of lung disease.    Interim history: Continues on CellCept 1.5 mg twice  daily and Ofev On macitentan for pulmonary hypertension per Dr. Haroldine Laws States that breathing is stable with no issues Has annual follow-up at [facility] transplant program last month when she las pulmonary function test that show a decline in DLCO from 39% to 35%. Reports worse exercise capacity and more fatigue  She also had a repeat RHC in April 2023 which showed very mild PAH.  After last visit she had been started on Actemra after discussion with Dr. Trudie Reed.  She is due to get her second dose this week.  Outpatient Encounter Medications as of 03/16/2022  Medication Sig   acetaminophen (TYLENOL) 325 MG tablet Take 2 tablets (650 mg total) by mouth every 6 (six) hours as needed for mild pain, fever or headache (or Fever >/= 101).   amLODipine (NORVASC) 10 MG tablet Take 1 tablet (10 mg total) by mouth daily.   bisoprolol (ZEBETA) 5 MG tablet Take 1 tablet (5 mg total) by mouth daily.   clindamycin-benzoyl peroxide (BENZACLIN) gel Apply 1 Application topically as needed.   clobetasol cream (TEMOVATE) 4.12 % Apply 1 Application topically as needed.   esomeprazole (NEXIUM) 40 MG capsule Take 1 capsule (40 mg total) by mouth 2 (two) times daily.   furosemide (LASIX) 40 MG tablet Take 1 tablet (40 mg total) by mouth daily as needed for edema.   macitentan (OPSUMIT) 10 MG tablet Take 1 tablet (10 mg total) by mouth daily.   Multiple Vitamins-Minerals (HM MULTIVITAMIN ADULT GUMMY PO) Take 2 tablets by mouth daily.   mycophenolate (CELLCEPT) 500 MG tablet Take 3 tablets (1,500 mg total) by mouth every morning AND 3 tablets (  1,500 mg total) every evening.   Nintedanib (OFEV) 150 MG CAPS Take 1 capsule (150 mg total) by mouth 2 (two) times daily.   NP THYROID 120 MG tablet Take 1 tablet (120 mg total) by mouth daily before breakfast.   potassium chloride 20 MEQ/15ML (10%) SOLN Take 20 mEq by mouth as needed.   spironolactone (ALDACTONE) 25 MG tablet Take 1 tablet (25 mg total) by mouth 2 (two) times  daily.   tacrolimus (PROTOPIC) 0.1 % ointment Apply to dry skin on face and body daily to twice daily as needed.   triamcinolone cream (KENALOG) 0.1 % Apply 1 Application topically daily as needed.   [DISCONTINUED] PARoxetine (PAXIL) 10 MG tablet Take 1 tablet (10 mg total) by mouth daily as directed   No facility-administered encounter medications on file as of 03/16/2022.    Physical Exam: Blood pressure (!) 144/90, pulse 83, temperature 97.9 F (36.6 C), temperature source Oral, height '5\' 5"'$  (1.651 m), weight 135 lb 3.2 oz (61.3 kg), SpO2 100 %. Gen:      No acute distress HEENT:  EOMI, sclera anicteric Neck:     No masses; no thyromegaly Lungs:    Clear to auscultation bilaterally; normal respiratory effort CV:         Regular rate and rhythm; no murmurs Abd:      + bowel sounds; soft, non-tender; no palpable masses, no distension Ext:    No edema; adequate peripheral perfusion Skin:      Warm and dry; no rash Neuro: alert and oriented x 3 Psych: normal mood and affect   Data Reviewed: Imaging CT chest, [facility] 07/24/2017 Dependent atelectasis, subtle groundglass opacities at the base.  2.5 cm right thyroid nodule.  I have reviewed the images personally  CT high-resolution 09/18/2017- patchy reticular groundglass opacities, reticulation with subpleural sparing.  Mild traction bronchiectasis.  No honeycombing.  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  Aortic atherosclerosis, three-vessel coronary artery disease.  CT high-resolution 06/27/2018-stable interstitial lung disease.  CT high-resolution 06/11/2019-stable interstitial lung disease  CT high-resolution 05/19/2020-stable interstitial lung disease  CT high-resolution 11/10/2021-interstitial lung disease with slight progression compared to before I have reviewed the images personally.  PFTs FENO 09/06/2017-unable to complete  07/27/2017 FVC 1.65  (54%), FEV1 1.53 (62%], F/F 93, TLC 50, DLCO 44%, DLCO/VA 131%  10/25/2017 FVC 1.51 [50%], FEV1 1.26 [51%], F/F 83  01/29/2018 FVC 1.69 [5%), FEV1 1.50 [61%], F/F 89, DLCO 10.57 [41%]  07/01/2018 FVC 1.57 [52%), FEV1 1.47 [60%], F/F 93, DLCO 12.54 [59%)  01/06/2019 FVC 1.58 [52%], FEV1 1.46 [60%], F/F 92, TLC 2.62 [50%], DLCO 9.16 [42%] Severe restriction and diffusion impairment.  FVC is stable but DLCO has worsened.  6-minute walk  10/23/2017- 144 m Post walk heart rate, stats 94, 91%  02/04/2018- 249 m Post walk heart rate, sats 101, 99%  06/06/2018-212 m Post walk heart rate, sats 86, 92%  01/30/2019-155mPost walk heart rate, sats 83, 98%  01/13/21-238 m  Labs 08/20/2017-ANA, centromere, rheumatoid factor, SCL 70-negative QuantiFERON 09/12/2017-negative Hepatitis B, C screening 09/21/2017-negative G6PD 09/25/2017-13  Labs from Dr. HTrudie Reed3/7/19 Rheumatoid factor < 10, CCP-8, ANCA-negative, double-stranded DNA-negative, Smith antibody-negative, SCL 70-negative, RNP-negative SSA 5.8, SSB 1.5 Smith antibody-negative, ANA IFA-negative Myositis panel-negative CK 165, C4-30, C3-168, total complement greater than 60, aldolase 8.8 Sed rate 9, CRP 0.9  CBC, hepatic panel 09/16/2020-within normal limits  Cardiac Cardiac cath 09/05/2019 PA = 38/16 (25) PCW = 6  Fick cardiac output/index = 5.5/3.4 PVR = 3.4 WU Mild pulmonary hypertension  Cardiac cath 08/16/2021 PA = 36/14 (23) PCW = 3 Fick cardiac output/index = 7.3/4.4 PVR = 2.7  Overnight oximetry 01/30/2019- test time 8 hours 25 minutes Lowest O2 sat 87%.  Mean O2 sat 95%.  Time spent less than 88% - 1 minute 30 seconds No significant desaturation.  Does not need supplemental oxygen.  Assessment:  Scleroderma ILD Continue on CellCept 1.5 g twice daily, Bactrim prophylaxis and ofev Due to progression of disease we have also initiated Actemra which she is getting through Dr. Trudie Reed from rheumatology.  Continue close  monitoring.  Follow-up CT scheduled for February 2023  Unable to do pulmonary rehab as it interferes with her work and she is exercising with walking at home. Follow-up at [facility] transplant and labs at [facility] show normal liver function and blood counts.   Mild pulmonary hypertension On macitentan.  Follows with Dr. Haroldine Laws RHC earlier in 2023 with very mild pulm HTN  Esophageal dysfunction, esophageal stricture, GERD Continue on Protonix 20 mg twice daily Follows with [facility] gastroenterology  Plan/Recommendations: - Continue CellCept, Ofev - Continue Actemra - Bactrim prophylaxis. - Follow-up HRCT - Exercise  Marshell Garfinkel MD [facility] 03/16/2022, 8:53 AM

## 2022-03-22 ENCOUNTER — Telehealth (HOSPITAL_COMMUNITY): Payer: Self-pay | Admitting: Pharmacy Technician

## 2022-03-22 NOTE — Telephone Encounter (Signed)
Advanced Heart Failure Patient Advocate Encounter  Received a call from Alphonsa Overall that the referral was sent back from [facility] and they were unable to reach the patient to find out why. [facility] was not able to tell Alphonsa Overall why this was done either. Reached out to Verita Lamb, [facility] Recruitment consultant of Pharmacy Benefits) and confirmed that after 03/08/22 that [facility] would need to be routed to [facility] ([facility] no longer in network). The patient was given this update via vm on 10/03.  Called and updated Alphonsa Overall. They are going to route the patient's referral to [facility]. Confirmed that a new RX was not needed at this time.  Charlann Boxer, CPhT

## 2022-03-24 ENCOUNTER — Other Ambulatory Visit (HOSPITAL_COMMUNITY): Payer: Self-pay | Admitting: Cardiology

## 2022-03-24 MED ORDER — MACITENTAN 10 MG PO TABS: 10.0000 mg | ORAL_TABLET | Freq: Every day | ORAL | 11 refills | Status: DC

## 2022-04-13 ENCOUNTER — Other Ambulatory Visit (HOSPITAL_COMMUNITY): Payer: Self-pay

## 2022-04-13 ENCOUNTER — Ambulatory Visit (INDEPENDENT_AMBULATORY_CARE_PROVIDER_SITE_OTHER): Payer: 59 | Admitting: Internal Medicine

## 2022-04-13 ENCOUNTER — Encounter: Payer: Self-pay | Admitting: Internal Medicine

## 2022-04-13 VITALS — BP 123/87 | HR 72 | Ht 65.0 in | Wt 133.8 lb

## 2022-04-13 DIAGNOSIS — R232 Flushing: Secondary | ICD-10-CM | POA: Diagnosis not present

## 2022-04-13 DIAGNOSIS — J3089 Other allergic rhinitis: Secondary | ICD-10-CM | POA: Diagnosis not present

## 2022-04-13 DIAGNOSIS — E039 Hypothyroidism, unspecified: Secondary | ICD-10-CM | POA: Diagnosis not present

## 2022-04-13 DIAGNOSIS — I10 Essential (primary) hypertension: Secondary | ICD-10-CM | POA: Diagnosis not present

## 2022-04-13 DIAGNOSIS — R739 Hyperglycemia, unspecified: Secondary | ICD-10-CM | POA: Diagnosis not present

## 2022-04-13 DIAGNOSIS — K219 Gastro-esophageal reflux disease without esophagitis: Secondary | ICD-10-CM | POA: Diagnosis not present

## 2022-04-13 DIAGNOSIS — J849 Interstitial pulmonary disease, unspecified: Secondary | ICD-10-CM | POA: Diagnosis not present

## 2022-04-13 DIAGNOSIS — R0789 Other chest pain: Secondary | ICD-10-CM | POA: Diagnosis not present

## 2022-04-13 DIAGNOSIS — E042 Nontoxic multinodular goiter: Secondary | ICD-10-CM

## 2022-04-13 DIAGNOSIS — R112 Nausea with vomiting, unspecified: Secondary | ICD-10-CM | POA: Diagnosis not present

## 2022-04-13 DIAGNOSIS — E782 Mixed hyperlipidemia: Secondary | ICD-10-CM | POA: Diagnosis not present

## 2022-04-13 DIAGNOSIS — T50905D Adverse effect of unspecified drugs, medicaments and biological substances, subsequent encounter: Secondary | ICD-10-CM | POA: Diagnosis not present

## 2022-04-13 DIAGNOSIS — Z79899 Other long term (current) drug therapy: Secondary | ICD-10-CM | POA: Diagnosis not present

## 2022-04-13 DIAGNOSIS — T887XXD Unspecified adverse effect of drug or medicament, subsequent encounter: Secondary | ICD-10-CM | POA: Diagnosis not present

## 2022-04-13 DIAGNOSIS — R053 Chronic cough: Secondary | ICD-10-CM | POA: Diagnosis not present

## 2022-04-13 DIAGNOSIS — M349 Systemic sclerosis, unspecified: Secondary | ICD-10-CM | POA: Diagnosis not present

## 2022-04-13 DIAGNOSIS — J309 Allergic rhinitis, unspecified: Secondary | ICD-10-CM | POA: Insufficient documentation

## 2022-04-13 MED ORDER — BENZONATATE 100 MG PO CAPS
100.0000 mg | ORAL_CAPSULE | Freq: Two times a day (BID) | ORAL | 1 refills | Status: DC | PRN
  Filled 2022-04-13: qty 30, 15d supply, fill #0

## 2022-04-13 NOTE — Assessment & Plan Note (Signed)
Followed by Dr. Vaughan Browner On Dic[facility] Gave currently Has had flash pulmonary edema and pulmonary hypertension from it, followed by CHF clinic - but currently appears euvolemic  Chronic cough, likely due to ILD and/or GERD Tessalon as needed for cough

## 2022-04-13 NOTE — Assessment & Plan Note (Signed)
On Nexium for GERD, cough could be due to GERD Avoid hot and spicy food

## 2022-04-13 NOTE — Assessment & Plan Note (Signed)
Advised to take Claritin as needed Cough could be due to postnasal drip, Tessalon as needed for now Robitussin as needed for cough

## 2022-04-13 NOTE — Assessment & Plan Note (Signed)
Reviewed US thyroid in 04/23 - stable sized thyroid nodules

## 2022-04-13 NOTE — Progress Notes (Signed)
Established Patient Office Visit  Subjective:  Patient ID: Karen [name], female    DOB: Nov 28, 1970  Age: 51 y.o. MRN: [id number]  CC:  Chief Complaint  Patient presents with   Hypothyroidism   Hypertension   Cough    HPI Karen [name] is a 51 y.o. female with past medical history of HTN, scleroderma, ILD, pulmonary hypertension, dysphagia, hypothyroidism, chronic constipation and hot flashes who presents for f/u of her chronic medical conditions.  She has history of hypothyroidism, for which she takes NP thyroid.  She currently denies any tremors, palpitations, or recent change in weight or appetite.   Essential hypertension and pulmonary hypertension: Her blood pressure is well controlled with amlodipine, Zebeta and spironolactone currently.  She has history of pulmonary hypertension from ILD, for which she takes spironolactone, Opsumit and Lasix currently.  She follows up with CHF clinic as well.  She currently denies any dyspnea or recent worsening of leg swelling.  She reports chronic cough, but denies any fever, chills, or recent worsening of dyspnea.  She has chronic nasal congestion and reports history of environmental allergies.  She has tried multiple antihistaminics including Zyrtec, Xyzal, etc.  She has history of scleroderma, for which she sees Dr. Trudie Reed. She is on Actemra now.  She sees Dr. 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  She is on Ofev for it.   Past Medical History:  Diagnosis Date   Acute respiratory failure with hypoxia due to flash pulmonary edema requiring intubation 02/13/2020   Calculus of gallbladder with acute cholecystitis without obstruction    CHF (congestive heart failure) (HCC)    GERD (gastroesophageal reflux disease)    Hypertension    Hypothyroidism    ILD (interstitial lung disease) ([place]) 02/08/2018   Interstitial lung disease ([place])    Multinodular thyroid    benign FNA 08/2017.   Perimenopause 02/27/2019   Pulmonary edema     Scleroderma ([place])    Status post laparoscopic cholecystectomy 02/13/20 02/13/2020   Vitamin D deficiency disease 02/27/2019    Past Surgical History:  Procedure Laterality Date   ABDOMINAL HERNIA REPAIR     BUNIONECTOMY Bilateral 10/2018   CHOLECYSTECTOMY N/A 02/13/2020   Procedure: LAPAROSCOPIC CHOLECYSTECTOMY;  Surgeon: Aviva Signs, MD;  Location: [facility];  Service: General;  Laterality: N/A;   COLONOSCOPY WITH PROPOFOL N/A 01/02/2019   Procedure: COLONOSCOPY WITH PROPOFOL;  Surgeon: Daneil Dolin, MD;  Location: [facility];  Service: Endoscopy;  Laterality: N/A;  12:30pm   ESOPHAGOGASTRODUODENOSCOPY (EGD) WITH PROPOFOL N/A 01/28/2018   erosive reflux esophagitis, patulous EG Junction, no dilation, incomplete EGD due to retained food in stomach. GES thereafter with delayed gastric emptying.    RIGHT HEART CATH N/A 09/27/2017   Procedure: RIGHT HEART CATH;  Surgeon: Jolaine Artist, MD;  Location: [facility];  Service: Cardiovascular;  Laterality: N/A;   RIGHT HEART CATH N/A 09/05/2019   Procedure: RIGHT HEART CATH;  Surgeon: Jolaine Artist, MD;  Location: [facility];  Service: Cardiovascular;  Laterality: N/A;   RIGHT/LEFT HEART CATH AND CORONARY ANGIOGRAPHY N/A 08/18/2021   Procedure: RIGHT/LEFT HEART CATH AND CORONARY ANGIOGRAPHY;  Surgeon: Jolaine Artist, MD;  Location: [facility];  Service: Cardiovascular;  Laterality: N/A;   UTERINE FIBROID SURGERY      Family History  Problem Relation Age of Onset   Hypertension Father    Hypertension Sister    Hypertension Brother    Diabetes Maternal Grandfather    Diabetes  Maternal Uncle    Diabetes Mother    Colon cancer Neg Hx     Social History   Socioeconomic History   Marital status: Single    Spouse name: Not on file   Number of children: 0   Years of education: Not on file   Highest education level: Associate degree: occupational, Hotel manager, or vocational program  Occupational History    Occupation: CMA    Employer: [employer]  Tobacco Use   Smoking status: Never   Smokeless tobacco: Never  Vaping Use   Vaping Use: Former  Substance and Sexual Activity   Alcohol use: No   Drug use: No   Sexual activity: Yes  Other Topics Concern   Not on file  Social History Narrative   Patient is right-handed. She lives alone in one level home, a few steps to enter.CMA [employer].   Social Determinants of Health   Financial Resource Strain: Not on file  Food Insecurity: Not on file  Transportation Needs: Not on file  Physical Activity: Not on file  Stress: Not on file  Social Connections: Not on file  Intimate Partner Violence: Not on file    Outpatient Medications Prior to Visit  Medication Sig Dispense Refill   acetaminophen (TYLENOL) 325 MG tablet Take 2 tablets (650 mg total) by mouth every 6 (six) hours as needed for mild pain, fever or headache (or Fever >/= 101). 12 tablet 0   amLODipine (NORVASC) 10 MG tablet Take 1 tablet (10 mg total) by mouth daily. 30 tablet 6   bisoprolol (ZEBETA) 5 MG tablet Take 1 tablet (5 mg total) by mouth daily. 90 tablet 3   clindamycin-benzoyl peroxide (BENZACLIN) gel Apply 1 Application topically as needed.     clobetasol cream (TEMOVATE) 5.36 % Apply 1 Application topically as needed.     esomeprazole (NEXIUM) 40 MG capsule Take 1 capsule (40 mg total) by mouth 2 (two) times daily. 180 capsule 1   furosemide (LASIX) 40 MG tablet Take 1 tablet (40 mg total) by mouth daily as needed for edema. 30 tablet 6   macitentan (OPSUMIT) 10 MG tablet Take 1 tablet (10 mg total) by mouth daily. 30 tablet 11   Multiple Vitamins-Minerals (HM MULTIVITAMIN ADULT GUMMY PO) Take 2 tablets by mouth daily.     mycophenolate (CELLCEPT) 500 MG tablet Take 3 tablets (1,500 mg total) by mouth every morning AND 3 tablets (1,500 mg total) every evening. 540 tablet 1   Nintedanib (OFEV) 150 MG CAPS Take 1 capsule (150 mg total) by mouth 2  (two) times daily. 60 capsule 4   NP THYROID 120 MG tablet Take 1 tablet (120 mg total) by mouth daily before breakfast. 90 tablet 0   potassium chloride 20 MEQ/15ML (10%) SOLN Take 20 mEq by mouth as needed.     spironolactone (ALDACTONE) 25 MG tablet Take 1 tablet (25 mg total) by mouth 2 (two) times daily. 60 tablet 6   tacrolimus (PROTOPIC) 0.1 % ointment Apply to dry skin on face and body daily to twice daily as needed. 60 g 5   Tocilizumab (ACTEMRA) 80 MG/4ML SOLN injection 20m/kg Intravenous every 4 weeks     triamcinolone cream (KENALOG) 0.1 % Apply 1 Application topically daily as needed.     No facility-administered medications prior to visit.    Allergies  Allergen Reactions   Lisinopril Hives, Swelling and Other (See Comments)    ROS Review of Systems  Constitutional:  Positive for fatigue. Negative  for chills and fever.  HENT:  Positive for congestion and sinus pressure. Negative for sinus pain and sore throat.   Eyes:  Negative for pain and discharge.  Respiratory:  Positive for cough and shortness of breath.   Cardiovascular:  Negative for chest pain and palpitations.  Gastrointestinal:  Positive for constipation. Negative for abdominal pain, nausea and vomiting.  Endocrine: Negative for polydipsia and polyuria.  Genitourinary:  Negative for dysuria and hematuria.  Musculoskeletal:  Positive for arthralgias. Negative for neck pain and neck stiffness.  Skin:  Negative for rash.  Neurological:  Negative for dizziness and weakness.  Psychiatric/Behavioral:  Negative for agitation and behavioral problems.       Objective:    Physical Exam Vitals reviewed.  Constitutional:      General: She is not in acute distress.    Appearance: She is not diaphoretic.  HENT:     Head: Normocephalic and atraumatic.     Nose: Congestion present.     Mouth/Throat:     Mouth: Mucous membranes are moist.  Eyes:     General: No scleral icterus.    Extraocular Movements:  Extraocular movements intact.  Cardiovascular:     Rate and Rhythm: Normal rate and regular rhythm.     Pulses: Normal pulses.     Heart sounds: Normal heart sounds. No murmur heard. Pulmonary:     Breath sounds: Normal breath sounds. No wheezing or rales.  Musculoskeletal:     Cervical back: Neck supple. No tenderness.     Right lower leg: No edema.     Left lower leg: No edema.  Skin:    General: Skin is warm.     Findings: No rash.  Neurological:     General: No focal deficit present.     Mental Status: She is alert and oriented to person, place, and time.     Sensory: No sensory deficit.     Motor: No weakness.  Psychiatric:        Mood and Affect: Mood normal.        Behavior: Behavior normal.     BP 123/87 (BP Location: Right Arm, Patient Position: Sitting, Cuff Size: Normal)   Pulse 72   Ht _0  (1.651 m)   Wt 133 lb 12.8 oz (60.7 kg)   SpO2 93%   BMI 22.27 kg/m  Wt Readings from Last 3 Encounters:  04/13/22 133 lb 12.8 oz (60.7 kg)  03/16/22 135 lb 3.2 oz (61.3 kg)  02/16/22 132 lb 9.6 oz (60.1 kg)    Lab Results  Component Value Date   TSH 1.590 11/07/2021   Lab Results  Component Value Date   WBC 9.8 01/13/2022   HGB 13.7 01/13/2022   HCT 44.2 01/13/2022   MCV 88.4 01/13/2022   PLT 222 01/13/2022   Lab Results  Component Value Date   NA 141 01/13/2022   K 3.0 (L) 01/13/2022   CO2 28 01/13/2022   GLUCOSE 103 (H) 01/13/2022   BUN 10 01/13/2022   CREATININE 0.60 01/13/2022   BILITOT 0.4 11/22/2020   ALKPHOS 110 11/22/2020   AST 18 11/22/2020   ALT 9 11/22/2020   PROT 7.9 11/22/2020   ALBUMIN 4.7 11/22/2020   CALCIUM 10.0 01/13/2022   ANIONGAP 9 01/13/2022   EGFR 105 07/22/2021   GFR 87.51 11/20/2019   Lab Results  Component Value Date   CHOL 187 03/22/2017   Lab Results  Component Value Date   HDL 43 03/22/2017   Lab  Results  Component Value Date   LDLCALC 118 (H) 03/22/2017   Lab Results  Component Value Date   TRIG 135  02/14/2020   Lab Results  Component Value Date   CHOLHDL 4.3 03/22/2017   No results found for: "HGBA1C"    Assessment & Plan:   Problem List Items Addressed This Visit       Cardiovascular and Mediastinum   HTN (hypertension) - Primary    BP Readings from Last 1 Encounters:  04/13/22 123/87  Well-controlled with amlodipine, Zebeta and spironolactone Counseled for compliance with the medications Advised DASH diet and moderate exercise/walking, at least 150 mins/week      Relevant Orders   Basic Metabolic Panel (BMET)     Respiratory   ILD (interstitial lung disease) ([place])    Followed by Dr. Vaughan Browner On [facility] Gave currently Has had flash pulmonary edema and pulmonary hypertension from it, followed by CHF clinic - but currently appears euvolemic  Chronic cough, likely due to ILD and/or GERD Tessalon as needed for cough      Allergic rhinitis    Advised to take Claritin as needed Cough could be due to postnasal drip, Tessalon as needed for now Robitussin as needed for cough        Digestive   GERD (gastroesophageal reflux disease)    On Nexium for GERD, cough could be due to GERD Avoid hot and spicy food        Endocrine   Multinodular goiter    Reviewed US thyroid in 04/23 - stable sized thyroid nodules      Hypothyroidism, adult    Lab Results  Component Value Date   TSH 1.590 11/07/2021  On NP thyroid 120 mg QD -needs to take it in a.m. on empty stomach Check TSH and free T4      Relevant Orders   TSH + free T4   Other Visit Diagnoses     Chronic cough       Relevant Medications   benzonatate (TESSALON) 100 MG capsule   Mixed hyperlipidemia       Relevant Orders   Lipid Profile   Hyperglycemia       Relevant Orders   Hemoglobin A1c       Meds ordered this encounter  Medications   benzonatate (TESSALON) 100 MG capsule    Sig: Take 1 capsule (100 mg total) by mouth 2 (two) times daily as needed for cough.    Dispense:  30 capsule    Refill:   1    Follow-up: Return in about 4 months (around 08/13/2022).    Lindell Spar, MD

## 2022-04-13 NOTE — Assessment & Plan Note (Signed)
Lab Results  Component Value Date   TSH 1.590 11/07/2021   On NP thyroid 120 mg QD -needs to take it in a.m. on empty stomach Check TSH and free T4

## 2022-04-13 NOTE — Assessment & Plan Note (Signed)
BP Readings from Last 1 Encounters:  04/13/22 123/87   Well-controlled with amlodipine, Zebeta and spironolactone Counseled for compliance with the medications Advised DASH diet and moderate exercise/walking, at least 150 mins/week

## 2022-04-13 NOTE — Patient Instructions (Signed)
Please take Tessalon as needed for cough. Okay to take Robitussin DM as needed for cough.  Please take Claritin for allergies.  Please continue to take other medications as prescribed.  Please continue to follow low salt diet and perform moderate exercise/walking as tolerated.

## 2022-04-14 LAB — LIPID PANEL
Chol/HDL Ratio: 3.6 ratio (ref 0.0–4.4)
Cholesterol, Total: 196 mg/dL (ref 100–199)
HDL: 54 mg/dL (ref >39)
LDL Chol Calc (NIH): 125 mg/dL — ABNORMAL HIGH (ref 0–99)
Triglycerides: 93 mg/dL (ref 0–149)
VLDL Cholesterol Cal: 17 mg/dL (ref 5–40)

## 2022-04-14 LAB — TSH+FREE T4
Free T4: 1.3 ng/dL (ref 0.82–1.77)
TSH: 2.24 u[IU]/mL (ref 0.450–4.500)

## 2022-04-14 LAB — HEMOGLOBIN A1C
Est. average glucose Bld gHb Est-mCnc: 117 mg/dL
Hgb A1c MFr Bld: 5.7 % — ABNORMAL HIGH (ref 4.8–5.6)

## 2022-04-14 LAB — BASIC METABOLIC PANEL
BUN/Creatinine Ratio: 16 (ref 9–23)
BUN: 10 mg/dL (ref 6–24)
CO2: 20 mmol/L (ref 20–29)
Calcium: 10.8 mg/dL — ABNORMAL HIGH (ref 8.7–10.2)
Chloride: 104 mmol/L (ref 96–106)
Creatinine, Ser: 0.62 mg/dL (ref 0.57–1.00)
Glucose: 80 mg/dL (ref 70–99)
Potassium: 5.2 mmol/L (ref 3.5–5.2)
Sodium: 140 mmol/L (ref 134–144)
eGFR: 106 mL/min/{1.73_m2} (ref >59)

## 2022-04-19 ENCOUNTER — Other Ambulatory Visit (HOSPITAL_COMMUNITY): Payer: Self-pay

## 2022-05-11 DIAGNOSIS — J849 Interstitial pulmonary disease, unspecified: Secondary | ICD-10-CM | POA: Diagnosis not present

## 2022-05-11 DIAGNOSIS — Z6822 Body mass index (BMI) 22.0-22.9, adult: Secondary | ICD-10-CM | POA: Diagnosis not present

## 2022-05-11 DIAGNOSIS — I272 Pulmonary hypertension, unspecified: Secondary | ICD-10-CM | POA: Diagnosis not present

## 2022-05-11 DIAGNOSIS — M349 Systemic sclerosis, unspecified: Secondary | ICD-10-CM | POA: Diagnosis not present

## 2022-05-11 DIAGNOSIS — I7300 Raynaud's syndrome without gangrene: Secondary | ICD-10-CM | POA: Diagnosis not present

## 2022-06-02 ENCOUNTER — Other Ambulatory Visit: Payer: Self-pay | Admitting: Internal Medicine

## 2022-06-02 ENCOUNTER — Other Ambulatory Visit (HOSPITAL_COMMUNITY): Payer: Self-pay

## 2022-06-02 ENCOUNTER — Other Ambulatory Visit: Payer: Self-pay | Admitting: Gastroenterology

## 2022-06-05 ENCOUNTER — Other Ambulatory Visit (HOSPITAL_COMMUNITY): Payer: Self-pay

## 2022-06-05 ENCOUNTER — Other Ambulatory Visit: Payer: Self-pay

## 2022-06-05 MED ORDER — ESOMEPRAZOLE MAGNESIUM 40 MG PO CPDR
40.0000 mg | DELAYED_RELEASE_CAPSULE | Freq: Two times a day (BID) | ORAL | 1 refills | Status: DC
  Filled 2022-06-05 – 2022-09-13 (×2): qty 180, 90d supply, fill #0

## 2022-06-06 ENCOUNTER — Inpatient Hospital Stay (HOSPITAL_COMMUNITY)
Admission: EM | Admit: 2022-06-06 | Discharge: 2022-06-09 | DRG: 389 | Disposition: A | Discharge status: Home / Self Care | Payer: Commercial Managed Care - PPO | Attending: Family Medicine | Admitting: Family Medicine

## 2022-06-06 ENCOUNTER — Encounter: Payer: Self-pay | Admitting: Internal Medicine

## 2022-06-06 ENCOUNTER — Emergency Department (HOSPITAL_COMMUNITY): Payer: Commercial Managed Care - PPO

## 2022-06-06 ENCOUNTER — Encounter: Payer: Commercial Managed Care - PPO | Admitting: Internal Medicine

## 2022-06-06 ENCOUNTER — Other Ambulatory Visit: Payer: Self-pay

## 2022-06-06 ENCOUNTER — Encounter (HOSPITAL_COMMUNITY): Payer: Self-pay | Admitting: Emergency Medicine

## 2022-06-06 DIAGNOSIS — E559 Vitamin D deficiency, unspecified: Secondary | ICD-10-CM | POA: Diagnosis present

## 2022-06-06 DIAGNOSIS — I110 Hypertensive heart disease with heart failure: Secondary | ICD-10-CM | POA: Diagnosis present

## 2022-06-06 DIAGNOSIS — R110 Nausea: Secondary | ICD-10-CM | POA: Diagnosis not present

## 2022-06-06 DIAGNOSIS — Z79890 Hormone replacement therapy (postmenopausal): Secondary | ICD-10-CM | POA: Diagnosis not present

## 2022-06-06 DIAGNOSIS — Z8249 Family history of ischemic heart disease and other diseases of the circulatory system: Secondary | ICD-10-CM | POA: Diagnosis not present

## 2022-06-06 DIAGNOSIS — K56609 Unspecified intestinal obstruction, unspecified as to partial versus complete obstruction: Secondary | ICD-10-CM | POA: Diagnosis not present

## 2022-06-06 DIAGNOSIS — Z833 Family history of diabetes mellitus: Secondary | ICD-10-CM | POA: Diagnosis not present

## 2022-06-06 DIAGNOSIS — E039 Hypothyroidism, unspecified: Secondary | ICD-10-CM | POA: Diagnosis present

## 2022-06-06 DIAGNOSIS — M359 Systemic involvement of connective tissue, unspecified: Secondary | ICD-10-CM | POA: Diagnosis present

## 2022-06-06 DIAGNOSIS — I509 Heart failure, unspecified: Secondary | ICD-10-CM | POA: Diagnosis present

## 2022-06-06 DIAGNOSIS — R4182 Altered mental status, unspecified: Secondary | ICD-10-CM | POA: Diagnosis not present

## 2022-06-06 DIAGNOSIS — K2100 Gastro-esophageal reflux disease with esophagitis, without bleeding: Secondary | ICD-10-CM | POA: Diagnosis present

## 2022-06-06 DIAGNOSIS — I272 Pulmonary hypertension, unspecified: Secondary | ICD-10-CM | POA: Diagnosis present

## 2022-06-06 DIAGNOSIS — D252 Subserosal leiomyoma of uterus: Secondary | ICD-10-CM | POA: Diagnosis not present

## 2022-06-06 DIAGNOSIS — Z9049 Acquired absence of other specified parts of digestive tract: Secondary | ICD-10-CM

## 2022-06-06 DIAGNOSIS — I2510 Atherosclerotic heart disease of native coronary artery without angina pectoris: Secondary | ICD-10-CM | POA: Diagnosis not present

## 2022-06-06 DIAGNOSIS — M349 Systemic sclerosis, unspecified: Secondary | ICD-10-CM | POA: Diagnosis not present

## 2022-06-06 DIAGNOSIS — L2084 Intrinsic (allergic) eczema: Secondary | ICD-10-CM | POA: Diagnosis present

## 2022-06-06 DIAGNOSIS — L209 Atopic dermatitis, unspecified: Secondary | ICD-10-CM | POA: Diagnosis present

## 2022-06-06 DIAGNOSIS — Z7682 Awaiting organ transplant status: Secondary | ICD-10-CM

## 2022-06-06 DIAGNOSIS — Z79899 Other long term (current) drug therapy: Secondary | ICD-10-CM | POA: Diagnosis not present

## 2022-06-06 DIAGNOSIS — N200 Calculus of kidney: Secondary | ICD-10-CM | POA: Diagnosis not present

## 2022-06-06 DIAGNOSIS — K439 Ventral hernia without obstruction or gangrene: Secondary | ICD-10-CM | POA: Diagnosis not present

## 2022-06-06 DIAGNOSIS — J849 Interstitial pulmonary disease, unspecified: Secondary | ICD-10-CM | POA: Diagnosis present

## 2022-06-06 DIAGNOSIS — I10 Essential (primary) hypertension: Secondary | ICD-10-CM | POA: Diagnosis not present

## 2022-06-06 DIAGNOSIS — K3189 Other diseases of stomach and duodenum: Secondary | ICD-10-CM | POA: Diagnosis not present

## 2022-06-06 LAB — BASIC METABOLIC PANEL
Anion gap: 12 (ref 5–15)
BUN: 13 mg/dL (ref 6–20)
CO2: 26 mmol/L (ref 22–32)
Calcium: 11.2 mg/dL — ABNORMAL HIGH (ref 8.9–10.3)
Chloride: 104 mmol/L (ref 98–111)
Creatinine, Ser: 0.68 mg/dL (ref 0.44–1.00)
GFR, Estimated: >60 mL/min (ref >60)
Glucose, Bld: 124 mg/dL — ABNORMAL HIGH (ref 70–99)
Potassium: 3.6 mmol/L (ref 3.5–5.1)
Sodium: 142 mmol/L (ref 135–145)

## 2022-06-06 LAB — HEPATIC FUNCTION PANEL
ALT: 16 U/L (ref 0–44)
AST: 23 U/L (ref 15–41)
Albumin: 4.8 g/dL (ref 3.5–5.0)
Alkaline Phosphatase: 99 U/L (ref 38–126)
Bilirubin, Direct: 0.1 mg/dL (ref 0.0–0.2)
Indirect Bilirubin: 0.6 mg/dL (ref 0.3–0.9)
Total Bilirubin: 0.7 mg/dL (ref 0.3–1.2)
Total Protein: 8.6 g/dL — ABNORMAL HIGH (ref 6.5–8.1)

## 2022-06-06 LAB — CBC WITH DIFFERENTIAL/PLATELET
Abs Immature Granulocytes: 0.03 10*3/uL (ref 0.00–0.07)
Basophils Absolute: 0 10*3/uL (ref 0.0–0.1)
Basophils Relative: 0 %
Eosinophils Absolute: 0.1 10*3/uL (ref 0.0–0.5)
Eosinophils Relative: 1 %
HCT: 46.3 % — ABNORMAL HIGH (ref 36.0–46.0)
Hemoglobin: 14.7 g/dL (ref 12.0–15.0)
Immature Granulocytes: 0 %
Lymphocytes Relative: 9 %
Lymphs Abs: 1 10*3/uL (ref 0.7–4.0)
MCH: 28.4 pg (ref 26.0–34.0)
MCHC: 31.7 g/dL (ref 30.0–36.0)
MCV: 89.6 fL (ref 80.0–100.0)
Monocytes Absolute: 0.3 10*3/uL (ref 0.1–1.0)
Monocytes Relative: 2 %
Neutro Abs: 10.2 10*3/uL — ABNORMAL HIGH (ref 1.7–7.7)
Neutrophils Relative %: 88 %
Platelets: 227 10*3/uL (ref 150–400)
RBC: 5.17 MIL/uL — ABNORMAL HIGH (ref 3.87–5.11)
RDW: 14 % (ref 11.5–15.5)
WBC: 11.6 10*3/uL — ABNORMAL HIGH (ref 4.0–10.5)
nRBC: 0 % (ref 0.0–0.2)

## 2022-06-06 LAB — AMMONIA: Ammonia: 16 umol/L (ref 9–35)

## 2022-06-06 LAB — LACTIC ACID, PLASMA
Lactic Acid, Venous: 1.8 mmol/L (ref 0.5–1.9)
Lactic Acid, Venous: 1.6 mmol/L (ref 0.5–1.9)

## 2022-06-06 LAB — CBG MONITORING, ED: Glucose-Capillary: 69 mg/dL — ABNORMAL LOW (ref 70–99)

## 2022-06-06 LAB — ETHANOL: Alcohol, Ethyl (B): <10 mg/dL (ref <10)

## 2022-06-06 LAB — TSH: TSH: 0.898 u[IU]/mL (ref 0.350–4.500)

## 2022-06-06 LAB — BLOOD GAS, VENOUS
Acid-Base Excess: 4.6 mmol/L — ABNORMAL HIGH (ref 0.0–2.0)
Bicarbonate: 30.9 mmol/L — ABNORMAL HIGH (ref 20.0–28.0)
Drawn by: 7012
O2 Saturation: 39.4 %
Patient temperature: 36.8
pCO2, Ven: 51 mmHg (ref 44–60)
pH, Ven: 7.39 (ref 7.25–7.43)
pO2, Ven: <31 mmHg — CL (ref 32–45)

## 2022-06-06 LAB — HCG, QUANTITATIVE, PREGNANCY: hCG, Beta Chain, Quant, S: 4 m[IU]/mL (ref <5)

## 2022-06-06 LAB — MAGNESIUM: Magnesium: 1.9 mg/dL (ref 1.7–2.4)

## 2022-06-06 LAB — BRAIN NATRIURETIC PEPTIDE: B Natriuretic Peptide: 52 pg/mL (ref 0.0–100.0)

## 2022-06-06 MED ORDER — ONDANSETRON HCL 4 MG/2ML IJ SOLN
4.0000 mg | Freq: Once | INTRAMUSCULAR | Status: AC
  Administered 2022-06-06: 4 mg via INTRAVENOUS
  Filled 2022-06-06: qty 2

## 2022-06-06 MED ORDER — IOHEXOL 300 MG/ML  SOLN
100.0000 mL | Freq: Once | INTRAMUSCULAR | Status: AC | PRN
  Administered 2022-06-06: 100 mL via INTRAVENOUS

## 2022-06-06 MED ORDER — SODIUM CHLORIDE 0.9 % IV BOLUS
1000.0000 mL | Freq: Once | INTRAVENOUS | Status: AC
  Administered 2022-06-06: 1000 mL via INTRAVENOUS

## 2022-06-06 MED ORDER — PANTOPRAZOLE SODIUM 40 MG IV SOLR
40.0000 mg | Freq: Once | INTRAVENOUS | Status: AC
  Administered 2022-06-06: 40 mg via INTRAVENOUS
  Filled 2022-06-06: qty 10

## 2022-06-06 MED ORDER — ONDANSETRON HCL 4 MG/2ML IJ SOLN
4.0000 mg | Freq: Four times a day (QID) | INTRAMUSCULAR | Status: DC | PRN
  Administered 2022-06-07 – 2022-06-08 (×2): 4 mg via INTRAVENOUS
  Filled 2022-06-06 (×2): qty 2

## 2022-06-06 MED ORDER — ONDANSETRON HCL 4 MG PO TABS: 4.0000 mg | ORAL_TABLET | Freq: Four times a day (QID) | ORAL | Status: DC | PRN

## 2022-06-06 MED ORDER — ENOXAPARIN SODIUM 40 MG/0.4ML IJ SOSY
40.0000 mg | PREFILLED_SYRINGE | INTRAMUSCULAR | Status: DC
  Administered 2022-06-06: 40 mg via SUBCUTANEOUS
  Filled 2022-06-06: qty 0.4

## 2022-06-06 MED ORDER — TACROLIMUS 0.1 % EX OINT: 1.0000 | TOPICAL_OINTMENT | Freq: Two times a day (BID) | CUTANEOUS | Status: DC | PRN

## 2022-06-06 MED ORDER — HYDROMORPHONE HCL 1 MG/ML IJ SOLN
0.5000 mg | Freq: Once | INTRAMUSCULAR | Status: AC
  Administered 2022-06-06: 0.5 mg via INTRAVENOUS
  Filled 2022-06-06: qty 0.5

## 2022-06-06 MED ORDER — TRIAMCINOLONE ACETONIDE 0.1 % EX CREA: 1.0000 | TOPICAL_CREAM | Freq: Every day | CUTANEOUS | Status: DC | PRN

## 2022-06-06 MED ORDER — LACTATED RINGERS IV BOLUS
1000.0000 mL | Freq: Once | INTRAVENOUS | Status: AC
  Administered 2022-06-06: 1000 mL via INTRAVENOUS

## 2022-06-06 MED ORDER — FUROSEMIDE 10 MG/ML IJ SOLN: 20.0000 mg | Freq: Every day | INTRAMUSCULAR | Status: DC | PRN

## 2022-06-06 MED ORDER — SODIUM CHLORIDE 0.45 % IV SOLN: INTRAVENOUS | Status: DC

## 2022-06-06 MED ORDER — HYDRALAZINE HCL 20 MG/ML IJ SOLN: 5.0000 mg | INTRAMUSCULAR | Status: DC | PRN

## 2022-06-06 NOTE — ED Provider Notes (Signed)
[facility] Provider Note   CSN: [id number] Arrival date & time: 06/06/22  1420     History {Add pertinent medical, surgical, social history, OB history to HPI:1} Chief Complaint  Patient presents with   Altered Mental Status    Karen [name] is a 52 y.o. female.  Patient presents with vomiting abdominal pain and mild confusion.  She has a history of interstitial lung disease and scleroderma and is on the transplant list.  Patient takes immunosuppressive therapy   Altered Mental Status      Home Medications Prior to Admission medications   Medication Sig Start Date End Date Taking? Authorizing Provider  acetaminophen (TYLENOL) 325 MG tablet Take 2 tablets (650 mg total) by mouth every 6 (six) hours as needed for mild pain, fever or headache (or Fever >/= 101). 02/15/20   Roxan Hockey, MD  amLODipine (NORVASC) 10 MG tablet Take 1 tablet (10 mg total) by mouth daily. 07/21/21   Bensimhon, Shaune Pascal, MD  benzonatate (TESSALON) 100 MG capsule Take 1 capsule (100 mg total) by mouth 2 (two) times daily as needed for cough. 04/13/22   Lindell Spar, MD  bisoprolol (ZEBETA) 5 MG tablet Take 1 tablet (5 mg total) by mouth daily. 09/27/21   Bensimhon, Shaune Pascal, MD  clindamycin-benzoyl peroxide (BENZACLIN) gel Apply 1 Application topically as needed.    [provider]  clobetasol cream (TEMOVATE) 3.29 % Apply 1 Application topically as needed.    [provider]  esomeprazole (NEXIUM) 40 MG capsule Take 1 capsule (40 mg total) by mouth 2 (two) times daily. 06/05/22   Sherron Monday, NP  furosemide (LASIX) 40 MG tablet Take 1 tablet (40 mg total) by mouth daily as needed for edema. 07/21/21   Bensimhon, Shaune Pascal, MD  macitentan (OPSUMIT) 10 MG tablet Take 1 tablet (10 mg total) by mouth daily. 03/24/22   Bensimhon, Shaune Pascal, MD  Multiple Vitamins-Minerals (HM MULTIVITAMIN ADULT GUMMY PO) Take 2 tablets by mouth daily.     [provider]  mycophenolate (CELLCEPT) 500 MG tablet Take 3 tablets (1,500 mg total) by mouth every morning AND 3 tablets (1,500 mg total) every evening. 01/18/22   Mannam, Hart Robinsons, MD  Nintedanib (OFEV) 150 MG CAPS Take 1 capsule (150 mg total) by mouth 2 (two) times daily. 01/20/21   Marshell Garfinkel, MD  NP THYROID 120 MG tablet Take 1 tablet (120 mg total) by mouth daily before breakfast. 08/04/21   Lindell Spar, MD  potassium chloride 20 MEQ/15ML (10%) SOLN Take 20 mEq by mouth as needed.    [provider]  spironolactone (ALDACTONE) 25 MG tablet Take 1 tablet (25 mg total) by mouth 2 (two) times daily. 07/21/21   Bensimhon, Shaune Pascal, MD  tacrolimus (PROTOPIC) 0.1 % ointment Apply to dry skin on face and body daily to twice daily as needed. 05/18/21     Tocilizumab (ACTEMRA) 80 MG/4ML SOLN injection '4mg'$ /kg Intravenous every 4 weeks    [provider]  triamcinolone cream (KENALOG) 0.1 % Apply 1 Application topically daily as needed.    [provider]  PARoxetine (PAXIL) 10 MG tablet Take 1 tablet (10 mg total) by mouth daily as directed 10/07/20 12/02/20        Allergies    Lisinopril    Review of Systems   Review of Systems  Physical Exam Updated Vital Signs BP (!) 143/93 (BP Location: Right Arm)   Pulse 90   Temp  98.3 F (36.8 C) (Oral)   Resp 16   Ht '5\' 5"'$  (1.651 m)   Wt 62 kg   SpO2 99%   BMI 22.75 kg/m  Physical Exam  ED Results / Procedures / Treatments   Labs (all labs ordered are listed, but only abnormal results are displayed) Labs Reviewed  CBC WITH DIFFERENTIAL/PLATELET - Abnormal; Notable for the following components:      Result Value   WBC 11.6 (*)    RBC 5.17 (*)    HCT 46.3 (*)    Neutro Abs 10.2 (*)    All other components within normal limits  BASIC METABOLIC PANEL - Abnormal; Notable for the following components:   Glucose, Bld 124 (*)    Calcium 11.2 (*)    All other components within normal limits  HEPATIC  FUNCTION PANEL - Abnormal; Notable for the following components:   Total Protein 8.6 (*)    All other components within normal limits  BLOOD GAS, VENOUS - Abnormal; Notable for the following components:   pO2, Ven <31 (*)    Bicarbonate 30.9 (*)    Acid-Base Excess 4.6 (*)    All other components within normal limits  CBG MONITORING, ED - Abnormal; Notable for the following components:   Glucose-Capillary 69 (*)    All other components within normal limits  AMMONIA  ETHANOL  MAGNESIUM  TSH  HCG, QUANTITATIVE, PREGNANCY  LACTIC ACID, PLASMA  BRAIN NATRIURETIC PEPTIDE  URINALYSIS, ROUTINE W REFLEX MICROSCOPIC  RAPID URINE DRUG SCREEN, HOSP PERFORMED  MYCOPHENOLIC ACID (CELLCEPT)  LACTIC ACID, PLASMA  CBG MONITORING, ED  POC URINE PREG, ED    EKG EKG Interpretation  Date/Time:  Tuesday June 06 2022 14:56:59 EST Ventricular Rate:  85 PR Interval:  171 QRS Duration: 78 QT Interval:  372 QTC Calculation: 443 R Axis:   -9 Text Interpretation: Sinus rhythm Probable left atrial enlargement Probable anterior infarct, age indeterminate Confirmed by Godfrey Pick [phone]) on 06/06/2022 3:01:36 PM  Radiology CT CHEST ABDOMEN PELVIS W CONTRAST  Result Date: 06/06/2022 CLINICAL DATA:  Chest and abdominal pain with nausea since this morning. EXAM: CT CHEST, ABDOMEN, AND PELVIS WITH CONTRAST TECHNIQUE: Multidetector CT imaging of the chest, abdomen and pelvis was performed following the standard protocol during bolus administration of intravenous contrast. RADIATION DOSE REDUCTION: This exam was performed according to the departmental dose-optimization program which includes automated exposure control, adjustment of the mA and/or kV according to patient size and/or use of iterative reconstruction technique. CONTRAST:  141m OMNIPAQUE IOHEXOL 300 MG/ML  SOLN COMPARISON:  Chest CT 11/10/2021 FINDINGS: CT CHEST FINDINGS Cardiovascular: The heart is normal in size. No pericardial effusion. The aorta  is within normal limits in caliber. No dissection. Scattered atherosclerotic calcifications. No significant coronary artery calcifications. The pulmonary arteries appear normal. Mediastinum/Nodes: Stable borderline mediastinal hilar lymph nodes likely reactive and due to the patient's underlying lung disease. No new or progressive findings. The esophagus is grossly normal. Stable 2.5 cm right thyroid nodule and 2 cm left thyroid nodule. This has been evaluated on previous imaging. (ref: J Am Coll Radiol. 2015 Feb;12(2): 143-50). Lungs/Pleura: Stable changes of interstitial lung disease. No acute superimposed pulmonary process. No worrisome pulmonary lesions or pulmonary nodules. Stable 5 mm perifissural nodule in the right middle lobe likely benign lymph node. The central tracheobronchial tree is unremarkable. Musculoskeletal: No breast masses, supraclavicular or axillary adenopathy. The bony thorax is intact. CT ABDOMEN PELVIS FINDINGS Hepatobiliary: No hepatic lesions or intrahepatic biliary dilatation. The gallbladder  is surgically absent. No common bile duct dilatation. Pancreas: No mass, inflammation or ductal dilatation. Spleen: Normal size.  No focal lesions. Adrenals/Urinary Tract: The adrenal glands are normal. Bilateral renal calculi but no obstructing ureteral calculi or hydroureteronephrosis. No worrisome renal lesions. The delayed images do not demonstrate any significant collecting system abnormalities. Stomach/Bowel: The stomach and duodenum. There are dilated fluid-filled proximal and mid small bowel loops with air-fluid levels consistent with a small-bowel obstruction. Transition to distal decompressed small bowel loops and largely decompressed colon. The exact point obstruction is not identified for certain but appears to be in the midline of the lower abdomen. The appendix is normal. Vascular/Lymphatic: The aorta and branch vessels are patent. The major venous structures are patent. Small  scattered mesenteric and retroperitoneal lymph nodes but no mass or adenopathy. Reproductive: Enlarged fibroid uterus with multiple exophytic/serosal fibroids. No definite adnexal masses. Other: Small anterior abdominal wall hernia containing fat. No subcutaneous lesions. Musculoskeletal: The bony structures are unremarkable. IMPRESSION: 1. CT findings consistent with a small-bowel obstruction. The exact point of obstruction is not identified for certain but appears to be in the midline of the lower abdomen. 2. Stable changes of interstitial lung disease. No acute superimposed pulmonary process. 3. Stable borderline mediastinal and hilar lymph nodes, likely reactive and due to the patient's underlying lung disease. 4. Bilateral renal calculi but no obstructing ureteral calculi or hydroureteronephrosis. 5. Enlarged fibroid uterus. 6. Status post cholecystectomy. No biliary dilatation. Electronically Signed   By: Marijo Sanes M.D.   On: 06/06/2022 17:02   CT HEAD WO CONTRAST  Result Date: 06/06/2022 CLINICAL DATA:  Mental status change, unknown cause EXAM: CT HEAD WITHOUT CONTRAST TECHNIQUE: Contiguous axial images were obtained from the base of the skull through the vertex without intravenous contrast. RADIATION DOSE REDUCTION: This exam was performed according to the departmental dose-optimization program which includes automated exposure control, adjustment of the mA and/or kV according to patient size and/or use of iterative reconstruction technique. COMPARISON:  CT examination dated February 05, 2022 FINDINGS: Brain: No evidence of acute infarction, hemorrhage, hydrocephalus, extra-axial collection or mass lesion/mass effect. Vascular: No hyperdense vessel or unexpected calcification. Skull: Normal. Negative for fracture or focal lesion. Sinuses/Orbits: No acute finding. Other: None. IMPRESSION: No acute intracranial pathology. Electronically Signed   By: Keane Police D.O.   On: 06/06/2022 16:19   DG Chest  Portable 1 View  Result Date: 06/06/2022 CLINICAL DATA:  Altered mental status EXAM: PORTABLE CHEST 1 VIEW COMPARISON:  Chest radiograph dated 01/13/2022 FINDINGS: Low lung volumes. Bibasilar patchy opacities, left-greater-than-right. No pleural effusion or pneumothorax. The heart size and mediastinal contours are within normal limits. The visualized skeletal structures are unremarkable. Right upper quadrant surgical clips. IMPRESSION: Low lung volumes with bibasilar patchy opacities, left-greater-than-right, likely atelectasis. Aspiration or pneumonia can be considered in the appropriate clinical setting. Electronically Signed   By: Darrin Nipper M.D.   On: 06/06/2022 15:33    Procedures Procedures  {Document cardiac monitor, telemetry assessment procedure when appropriate:1}  Medications Ordered in ED Medications  lactated ringers bolus 1,000 mL (0 mLs Intravenous Stopped 06/06/22 1620)  sodium chloride 0.9 % bolus 1,000 mL (1,000 mLs Intravenous New Bag/Given 06/06/22 1629)  ondansetron (ZOFRAN) injection 4 mg (4 mg Intravenous Given 06/06/22 1628)  pantoprazole (PROTONIX) injection 40 mg (40 mg Intravenous Given 06/06/22 1628)  iohexol (OMNIPAQUE) 300 MG/ML solution 100 mL (100 mLs Intravenous Contrast Given 06/06/22 1648)  ondansetron (ZOFRAN) injection 4 mg (4 mg Intravenous Given 06/06/22 1757)  HYDROmorphone (DILAUDID) injection 0.5 mg (0.5 mg Intravenous Given 06/06/22 1757)    ED Course/ Medical Decision Making/ A&P  Patient has a small bowel obstruction.  I spoke to general surgery Dr. Arnoldo Morale and he recommended supportive care with IV fluids and nausea and pain medicines.  If she continues to vomit she will need NG tube.  Patient will be admitted to medicine with surgery consult tomorrow {   Click here for ABCD2, HEART and other calculatorsREFRESH Note before signing :1}                          Medical Decision Making Amount and/or Complexity of Data Reviewed Labs: ordered. Radiology:  ordered.  Risk Prescription drug management. Decision regarding hospitalization.   Patient with small bowel obstruction.  She will be admitted to medicine with surgery seeing the patient tomorrow  {Document critical care time when appropriate:1} {Document review of labs and clinical decision tools ie heart score, Chads2Vasc2 etc:1}  {Document your independent review of radiology images, and any outside records:1} {Document your discussion with family members, caretakers, and with consultants:1} {Document social determinants of health affecting pt's care:1} {Document your decision making why or why not admission, treatments were needed:1} Final Clinical Impression(s) / ED Diagnoses Final diagnoses:  Small bowel obstruction ([place])    Rx / DC Orders ED Discharge Orders     None

## 2022-06-06 NOTE — H&P (Signed)
History and Physical    Patient: Karen [name] FAO:[id number] DOB: 05/18/1970 DOA: 06/06/2022 DOS: the patient was seen and examined on 06/06/2022 PCP: Lindell Spar, MD  Patient coming from: Home  Chief Complaint:  Chief Complaint  Patient presents with   Altered Mental Status   HPI: Karen [name] is a 52 y.o. female with medical history significant of Advanced scleroderma, GERD, CHF, Hypothyroidism, HTN, ILD on transplant list. J  Pt presenting w/ abd pain and n/v. Started w/ diarrhea which resolved. Emesis is bilious. Sx started on day of admission. Associated w/ lethargy. No other sx or changes in overall health prior to admission.   Review of Systems: As mentioned in the history of present illness. All other systems reviewed and are negative. Past Medical History:  Diagnosis Date   Acute respiratory failure with hypoxia due to flash pulmonary edema requiring intubation 02/13/2020   Calculus of gallbladder with acute cholecystitis without obstruction    CHF (congestive heart failure) (HCC)    GERD (gastroesophageal reflux disease)    Hypertension    Hypothyroidism    ILD (interstitial lung disease) ([place]) 02/08/2018   Interstitial lung disease ([place])    Multinodular thyroid    benign FNA 08/2017.   Perimenopause 02/27/2019   Pulmonary edema    Scleroderma ([place])    Status post laparoscopic cholecystectomy 02/13/20 02/13/2020   Vitamin D deficiency disease 02/27/2019   Past Surgical History:  Procedure Laterality Date   ABDOMINAL HERNIA REPAIR     BUNIONECTOMY Bilateral 10/2018   CHOLECYSTECTOMY N/A 02/13/2020   Procedure: LAPAROSCOPIC CHOLECYSTECTOMY;  Surgeon: Aviva Signs, MD;  Location: [facility];  Service: General;  Laterality: N/A;   COLONOSCOPY WITH PROPOFOL N/A 01/02/2019   Procedure: COLONOSCOPY WITH PROPOFOL;  Surgeon: Daneil Dolin, MD;  Location: [facility];  Service: Endoscopy;  Laterality: N/A;  12:30pm   ESOPHAGOGASTRODUODENOSCOPY (EGD) WITH PROPOFOL N/A  01/28/2018   erosive reflux esophagitis, patulous EG Junction, no dilation, incomplete EGD due to retained food in stomach. GES thereafter with delayed gastric emptying.    RIGHT HEART CATH N/A 09/27/2017   Procedure: RIGHT HEART CATH;  Surgeon: Jolaine Artist, MD;  Location: [facility];  Service: Cardiovascular;  Laterality: N/A;   RIGHT HEART CATH N/A 09/05/2019   Procedure: RIGHT HEART CATH;  Surgeon: Jolaine Artist, MD;  Location: [facility];  Service: Cardiovascular;  Laterality: N/A;   RIGHT/LEFT HEART CATH AND CORONARY ANGIOGRAPHY N/A 08/18/2021   Procedure: RIGHT/LEFT HEART CATH AND CORONARY ANGIOGRAPHY;  Surgeon: Jolaine Artist, MD;  Location: [facility];  Service: Cardiovascular;  Laterality: N/A;   UTERINE FIBROID SURGERY     Social History:  reports that she has never smoked. She has never used smokeless tobacco. She reports that she does not drink alcohol and does not use drugs.  Allergies  Allergen Reactions   Lisinopril Hives, Swelling and Other (See Comments)   Tocilizumab Other (See Comments)    Swelling and shortness of breath sharpe pains in back and chest tightness.    Family History  Problem Relation Age of Onset   Hypertension Father    Hypertension Sister    Hypertension Brother    Diabetes Maternal Grandfather    Diabetes Maternal Uncle    Diabetes Mother    Colon cancer Neg Hx     Prior to Admission medications   Medication Sig Start Date End Date Taking? Authorizing Provider  acetaminophen (TYLENOL) 325 MG tablet Take 2 tablets (650  mg total) by mouth every 6 (six) hours as needed for mild pain, fever or headache (or Fever >/= 101). 02/15/20  Yes Emokpae, Courage, MD  amLODipine (NORVASC) 10 MG tablet Take 1 tablet (10 mg total) by mouth daily. 07/21/21  Yes Bensimhon, Shaune Pascal, MD  benzonatate (TESSALON) 100 MG capsule Take 1 capsule (100 mg total) by mouth 2 (two) times daily as needed for cough. 04/13/22  Yes Lindell Spar, MD  bisoprolol (ZEBETA) 5 MG tablet Take 1 tablet (5 mg total) by mouth daily. 09/27/21  Yes Bensimhon, Shaune Pascal, MD  clindamycin-benzoyl peroxide (BENZACLIN) gel Apply 1 Application topically as needed.   Yes [provider]  clobetasol cream (TEMOVATE) 3.01 % Apply 1 Application topically as needed.   Yes [provider]  esomeprazole (NEXIUM) 40 MG capsule Take 1 capsule (40 mg total) by mouth 2 (two) times daily. 06/05/22  Yes Mahon, Lenise Arena, NP  furosemide (LASIX) 40 MG tablet Take 1 tablet (40 mg total) by mouth daily as needed for edema. 07/21/21  Yes Bensimhon, Shaune Pascal, MD  macitentan (OPSUMIT) 10 MG tablet Take 1 tablet (10 mg total) by mouth daily. 03/24/22  Yes Bensimhon, Shaune Pascal, MD  Multiple Vitamins-Minerals (HM MULTIVITAMIN ADULT GUMMY PO) Take 2 tablets by mouth daily.   Yes [provider]  mycophenolate (CELLCEPT) 500 MG tablet Take 3 tablets (1,500 mg total) by mouth every morning AND 3 tablets (1,500 mg total) every evening. 01/18/22  Yes Mannam, Praveen, MD  Nintedanib (OFEV) 150 MG CAPS Take 1 capsule (150 mg total) by mouth 2 (two) times daily. 01/20/21  Yes Mannam, Hart Robinsons, MD  NP THYROID 120 MG tablet Take 1 tablet (120 mg total) by mouth daily before breakfast. 08/04/21  Yes Lindell Spar, MD  potassium chloride 20 MEQ/15ML (10%) SOLN Take 20 mEq by mouth as needed.   Yes [provider]  spironolactone (ALDACTONE) 25 MG tablet Take 1 tablet (25 mg total) by mouth 2 (two) times daily. 07/21/21  Yes Bensimhon, Shaune Pascal, MD  tacrolimus (PROTOPIC) 0.1 % ointment Apply to dry skin on face and body daily to twice daily as needed. 05/18/21  Yes   triamcinolone cream (KENALOG) 0.1 % Apply 1 Application topically daily as needed.   Yes [provider]  PARoxetine (PAXIL) 10 MG tablet Take 1 tablet (10 mg total) by mouth daily as directed 10/07/20 12/02/20      Physical Exam: Vitals:   06/06/22 1602 06/06/22 1715 06/06/22 1730  06/06/22 1949  BP: (!) 137/92  (!) 143/93 (!) 127/90  Pulse: 88 (!) 116 90 90  Resp: '17 19 16 16  '$ Temp: 98.3 F (36.8 C)  98.3 F (36.8 C)   TempSrc: Oral  Oral   SpO2: 100% (!) 63% 99% 99%  Weight:      Height:        General:  Appears calm and comfortable Eyes:  PERRL, EOMI, normal lids, iris ENT:  grossly normal hearing, lips & tongue, mmm Neck:  no LAD, masses or thyromegaly Cardiovascular:  RRR, no m/r/g. No LE edema.  Respiratory: Diminished breath sounds overall, nml effort. No w/r/r.  Abdomen: Absent bowel sounds.  Skin:  no rash or induration seen on limited exam Musculoskeletal:  grossly normal tone BUE/BLE, good ROM, no bony abnormality Psychiatric:  grossly normal mood and affect, speech fluent and appropriate, AOx3 Neurologic:  CN 2-12 grossly intact, moves all extremities in coordinated fashion, sensation intact  Data Reviewed: CT chest/ABD -  consistent w/ ILD. No acute pulmonary process. ABD consistent w/ SBO  Assessment and Plan: Principal Problem:   SBO (small bowel obstruction) (HCC) Active Problems:   HTN (hypertension)   Scleroderma (HCC)   ILD (interstitial lung disease) ([place])   Intrinsic atopic dermatitis   Pulmonary hypertension (HCC)   Hypothyroidism, adult   GERD with esophagitis  SBO: EDP consulted Gen Surge. Currently no emesis. Suspect secondary to scleroderma - f/u Recs from General Surgery in am - NG to intermittent suction if emesis returns - NPO - Consider Gastrografin 32m per NG if placed w/ f/u DG ABD 8hrs post   ILD: Advanced. Pt on transplant list. Currently stable on RA - Monitor - Once taking PO resume Cellcept, OFEV,   Scleroderma: Stable - continu ehome Tacrolimus and Triamcinolone topical PRN  HTN/Edema: - Once able to take PO resume Norvasc, Bisoprolol, Spironolactone - Lasix IV PRN - Hydralazine IV PRN  Hypothyroid  - once taking PO resume Synthroid     Advance Care Planning:   Code Status: Full Code  FULL  Consults: General Surgery  Family Communication: Fiance  Severity of Illness: The appropriate patient status for this patient is INPATIENT. Inpatient status is judged to be reasonable and necessary in order to provide the required intensity of service to ensure the patient's safety. The patient's presenting symptoms, physical exam findings, and initial radiographic and laboratory data in the context of their chronic comorbidities is felt to place them at high risk for further clinical deterioration. Furthermore, it is not anticipated that the patient will be medically stable for discharge from the hospital within 2 midnights of admission.   * I certify that at the point of admission it is my clinical judgment that the patient will require inpatient hospital care spanning beyond 2 midnights from the point of admission due to high intensity of service, high risk for further deterioration and high frequency of surveillance required.*  Author: DWaldemar Dickens MD 06/06/2022 9:16 PM  For on call review [email or web address]

## 2022-06-06 NOTE — ED Notes (Signed)
Spoke to Karen [name] on 300 to call report. Was told the charge nurse was going to have to look at the bed assignment first.

## 2022-06-06 NOTE — ED Triage Notes (Signed)
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 Staff brought her over and left without full story as to what is wrong with the patient. Patient is lethargic/ AMS. CBG 69

## 2022-06-06 NOTE — ED Notes (Signed)
Tried again to give report. Charge nurse indicated she had not approved the bed yet.

## 2022-06-06 NOTE — ED Notes (Signed)
MD at bedside , pt unable to sign MSE

## 2022-06-06 NOTE — ED Provider Notes (Signed)
Patient presented from PCPs office.  When she initially arrived in the ED, she was lethargic and not speaking.  Airway was intact and breathing was unlabored.  Abdomen is soft with mild distention.  Blood glucose was normal.  Vital signs were notable only for hypertension.  Patient was placed on cardiac monitor.  Chart was reviewed and patient appears to be currently on transplant list for lung transplant secondary to history of interstitial lung disease and scleroderma.  She is currently on immunosuppressive medications.  I contacted her doctor's office by telephone.  They report that she made an appointment earlier this morning due to nausea and vomiting.  They report that she was lethargic when she arrived in their office and for that reason, she was brought to the ED.  Patient was given IV fluids.  On reassessment, she is more awake and alert.  She was able to provide history.  She states that she had onset of generalized abdominal pain, nausea, vomiting this morning while at work.  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  Symptoms have improved and are mild at this time.  She does have generalized abdominal tenderness.  Laboratory workup was initiated.  Patient will likely require CT imaging following assessment of kidney function.  Remaining care to be assumed by oncoming ED provider.   Godfrey Pick, MD 06/06/22 [phone]

## 2022-06-07 DIAGNOSIS — K56609 Unspecified intestinal obstruction, unspecified as to partial versus complete obstruction: Secondary | ICD-10-CM | POA: Diagnosis not present

## 2022-06-07 LAB — CBC
HCT: 39.6 % (ref 36.0–46.0)
Hemoglobin: 11.9 g/dL — ABNORMAL LOW (ref 12.0–15.0)
MCH: 27.4 pg (ref 26.0–34.0)
MCHC: 30.1 g/dL (ref 30.0–36.0)
MCV: 91.2 fL (ref 80.0–100.0)
Platelets: 183 10*3/uL (ref 150–400)
RBC: 4.34 MIL/uL (ref 3.87–5.11)
RDW: 13.9 % (ref 11.5–15.5)
WBC: 8.2 10*3/uL (ref 4.0–10.5)
nRBC: 0 % (ref 0.0–0.2)

## 2022-06-07 LAB — MYCOPHENOLIC ACID (CELLCEPT)
MPA Glucuronide: <5 ug/mL — ABNORMAL LOW (ref 15–125)
MPA: <0.1 ug/mL — ABNORMAL LOW (ref 1.0–3.5)

## 2022-06-07 LAB — BASIC METABOLIC PANEL
Anion gap: 6 (ref 5–15)
BUN: 12 mg/dL (ref 6–20)
CO2: 26 mmol/L (ref 22–32)
Calcium: 9.9 mg/dL (ref 8.9–10.3)
Chloride: 109 mmol/L (ref 98–111)
Creatinine, Ser: 0.65 mg/dL (ref 0.44–1.00)
GFR, Estimated: >60 mL/min (ref >60)
Glucose, Bld: 86 mg/dL (ref 70–99)
Potassium: 3.2 mmol/L — ABNORMAL LOW (ref 3.5–5.1)
Sodium: 141 mmol/L (ref 135–145)

## 2022-06-07 LAB — HIV ANTIBODY (ROUTINE TESTING W REFLEX): HIV Screen 4th Generation wRfx: NONREACTIVE

## 2022-06-07 MED ORDER — BISACODYL 10 MG RE SUPP
10.0000 mg | Freq: Once | RECTAL | Status: AC
  Administered 2022-06-07: 10 mg via RECTAL
  Filled 2022-06-07: qty 1

## 2022-06-07 MED ORDER — KETOROLAC TROMETHAMINE 15 MG/ML IJ SOLN
15.0000 mg | Freq: Once | INTRAMUSCULAR | Status: AC
  Administered 2022-06-07: 15 mg via INTRAVENOUS
  Filled 2022-06-07: qty 1

## 2022-06-07 MED ORDER — DOCUSATE SODIUM 100 MG PO CAPS
100.0000 mg | ORAL_CAPSULE | Freq: Every day | ORAL | Status: DC
  Administered 2022-06-07 – 2022-06-08 (×2): 100 mg via ORAL
  Filled 2022-06-07 (×2): qty 1

## 2022-06-07 MED ORDER — KETOROLAC TROMETHAMINE 15 MG/ML IJ SOLN
INTRAMUSCULAR | Status: AC
  Filled 2022-06-07: qty 1

## 2022-06-07 MED ORDER — DOCUSATE SODIUM 50 MG/5ML PO LIQD
100.0000 mg | Freq: Two times a day (BID) | ORAL | Status: DC
  Administered 2022-06-07: 100 mg via ORAL
  Filled 2022-06-07 (×6): qty 10

## 2022-06-07 MED ORDER — POTASSIUM CHLORIDE 2 MEQ/ML IV SOLN
INTRAVENOUS | Status: DC
  Filled 2022-06-07 (×2): qty 1000

## 2022-06-07 MED ORDER — POLYETHYLENE GLYCOL 3350 17 G PO PACK
17.0000 g | PACK | Freq: Every day | ORAL | Status: DC
  Administered 2022-06-07 – 2022-06-09 (×3): 17 g via ORAL
  Filled 2022-06-07 (×3): qty 1

## 2022-06-07 NOTE — Progress Notes (Signed)
This encounter was created in error - please disregard.

## 2022-06-07 NOTE — Progress Notes (Signed)
Patient states she feels light headed with ambulation and is still weak. MD Shahmehdi notified.

## 2022-06-07 NOTE — Progress Notes (Signed)
PROGRESS NOTE    Patient: Karen [name]                            PCP: Lindell Spar, MD                    DOB: [birth year]            DOA: 06/06/2022 [id number]             DOS: 06/07/2022, 10:31 AM   LOS: 1 day   Date of Service: The patient was seen and examined on 06/07/2022  Subjective:   The patient was seen and examined this morning. Hemodynamically stable.   Complaining of diffuse abdominal discomfort, pain, reporting of gas but no bowel movements  Brief Narrative:    Karen [name] is a 52 y.o. female with medical history significant of Advanced scleroderma, GERD, CHF, Hypothyroidism, HTN, ILD on transplant list.    Presenting w/ abd pain and n/v. Started w/ diarrhea which resolved. Emesis is bilious. Symptoms started on day of admission. Associated w/ lethargy. No other sx or changes in overall health prior to admission.      CT chest/ABD - consistent w/ ILD. No acute pulmonary process. ABD consistent w/ SBO       Assessment & Plan:   Principal Problem:   SBO (small bowel obstruction) (HCC) Active Problems:   HTN (hypertension)   Scleroderma (HCC)   ILD (interstitial lung disease) ([place])   Intrinsic atopic dermatitis   Pulmonary hypertension (HCC)   Hypothyroidism, adult   GERD with esophagitis        SBO (small bowel obstruction) -  - Consulted Gen Surge. Currently no emesis. Suspect secondary to scleroderma - f/u Recs from General Surgery  - NG to intermittent suction if emesis returns - NPO    ILD (interstitial lung disease)  Advanced. Pt on transplant list. Currently stable on RA - Monitor - Once taking PO resume Cellcept, OFEV,    Scleroderma: Stable - continu ehome Tacrolimus and Triamcinolone topical PRN   HTN/Edema: - Once able to take PO resume Norvasc, Bisoprolol, Spironolactone - Lasix IV PRN - Hydralazine IV PRN   Hypothyroid  - once taking PO resume  Synthroid     ----------------------------------------------------------------------------------------------------------------------------------------------- Nutritional status:  The patient's BMI is: Body mass index is 22.75 kg/m. I agree with the assessment and plan as outlined below: Nutrition Status:          ------------------------------------------------------------------------------------------------------------------------------------------------  DVT prophylaxis:  SCDs   Code Status:   Code Status: Full Code  Family Communication: Mother present at bedside-updated The above findings and plan of care has been discussed with patient (and family)  in detail,  they expressed understanding and agreement of above. -Advance care planning has been discussed.   Admission status:   Status is: Inpatient Remains inpatient appropriate because: N.p.o., IV fluids, needing close evaluation monitoring for   Disposition: From  - home             Planning for discharge in 1-2 days: to   Procedures:   No admission procedures for hospital encounter.   Antimicrobials:  Anti-infectives (From admission, onward)    None        Medication:   docusate  100 mg Oral BID    furosemide, hydrALAZINE, ondansetron **OR** ondansetron (ZOFRAN) IV, triamcinolone cream   Objective:   Vitals:   06/06/22 2205 06/06/22 2317 06/07/22 0532 06/07/22 0700  BP: 125/82  127/80 119/79 131/86  Pulse: 89 86 83   Resp: '16 18 16   '$ Temp: 98.4 F (36.9 C) 97.9 F (36.6 C) 98.2 F (36.8 C)   TempSrc: Oral     SpO2: 98% 98% 96%   Weight:      Height:        Intake/Output Summary (Last 24 hours) at 06/07/2022 1031 Last data filed at 06/07/2022 0400 Gross per 24 hour  Intake 452.5 ml  Output --  Net 452.5 ml   Filed Weights   06/06/22 1433  Weight: 62 kg     Physical examination:   Constitution:  Alert, cooperative, no distress,  Appears calm and comfortable  Psychiatric:    Normal and stable mood and affect, cognition intact,   HEENT:        Normocephalic, PERRL, otherwise with in Normal limits  Chest:         Chest symmetric Cardio vascular:  S1/S2, RRR, No murmure, No Rubs or Gallops  pulmonary: Clear to auscultation bilaterally, respirations unlabored, negative wheezes / crackles Abdomen: Soft, diffuse mild tenderness,  non-distended, no active bowel sounds,no masses, no organomegaly Muscular skeletal: Limited exam - in bed, able to move all 4 extremities,   Neuro: CNII-XII intact. , normal motor and sensation, reflexes intact  Extremities: No pitting edema lower extremities, +2 pulses  Skin: Dry, warm to touch, negative for any Rashes, No open wounds Wounds: per nursing documentation   ------------------------------------------------------------------------------------------------------------------------------------------    LABs:     Latest Ref Rng & Units 06/07/2022    4:17 AM 06/06/2022    2:39 PM 01/13/2022    2:18 PM  CBC  WBC 4.0 - 10.5 K/uL 8.2  11.6  9.8   Hemoglobin 12.0 - 15.0 g/dL 11.9  14.7  13.7   Hematocrit 36.0 - 46.0 % 39.6  46.3  44.2   Platelets 150 - 400 K/uL 183  227  222       Latest Ref Rng & Units 06/07/2022    4:17 AM 06/06/2022    2:39 PM 04/13/2022    3:42 PM  CMP  Glucose 70 - 99 mg/dL 86  124  80   BUN 6 - 20 mg/dL '12  13  10   '$ Creatinine 0.44 - 1.00 mg/dL 0.65  0.68  0.62   Sodium 135 - 145 mmol/L 141  142  140   Potassium 3.5 - 5.1 mmol/L 3.2  3.6  5.2   Chloride 98 - 111 mmol/L 109  104  104   CO2 22 - 32 mmol/L '26  26  20   '$ Calcium 8.9 - 10.3 mg/dL 9.9  11.2  10.8   Total Protein 6.5 - 8.1 g/dL  8.6    Total Bilirubin 0.3 - 1.2 mg/dL  0.7    Alkaline Phos 38 - 126 U/L  99    AST 15 - 41 U/L  23    ALT 0 - 44 U/L  16         Micro Results No results found for this or any previous visit (from the past 240 hour(s)).  Radiology Reports CT CHEST ABDOMEN PELVIS W CONTRAST  Result Date: 06/06/2022 CLINICAL  DATA:  Chest and abdominal pain with nausea since this morning. EXAM: CT CHEST, ABDOMEN, AND PELVIS WITH CONTRAST TECHNIQUE: Multidetector CT imaging of the chest, abdomen and pelvis was performed following the standard protocol during bolus administration of intravenous contrast. RADIATION DOSE REDUCTION: This exam was performed according to the departmental dose-optimization  program which includes automated exposure control, adjustment of the mA and/or kV according to patient size and/or use of iterative reconstruction technique. CONTRAST:  145m OMNIPAQUE IOHEXOL 300 MG/ML  SOLN COMPARISON:  Chest CT 11/10/2021 FINDINGS: CT CHEST FINDINGS Cardiovascular: The heart is normal in size. No pericardial effusion. The aorta is within normal limits in caliber. No dissection. Scattered atherosclerotic calcifications. No significant coronary artery calcifications. The pulmonary arteries appear normal. Mediastinum/Nodes: Stable borderline mediastinal hilar lymph nodes likely reactive and due to the patient's underlying lung disease. No new or progressive findings. The esophagus is grossly normal. Stable 2.5 cm right thyroid nodule and 2 cm left thyroid nodule. This has been evaluated on previous imaging. (ref: J Am Coll Radiol. 2015 Feb;12(2): 143-50). Lungs/Pleura: Stable changes of interstitial lung disease. No acute superimposed pulmonary process. No worrisome pulmonary lesions or pulmonary nodules. Stable 5 mm perifissural nodule in the right middle lobe likely benign lymph node. The central tracheobronchial tree is unremarkable. Musculoskeletal: No breast masses, supraclavicular or axillary adenopathy. The bony thorax is intact. CT ABDOMEN PELVIS FINDINGS Hepatobiliary: No hepatic lesions or intrahepatic biliary dilatation. The gallbladder is surgically absent. No common bile duct dilatation. Pancreas: No mass, inflammation or ductal dilatation. Spleen: Normal size.  No focal lesions. Adrenals/Urinary Tract: The  adrenal glands are normal. Bilateral renal calculi but no obstructing ureteral calculi or hydroureteronephrosis. No worrisome renal lesions. The delayed images do not demonstrate any significant collecting system abnormalities. Stomach/Bowel: The stomach and duodenum. There are dilated fluid-filled proximal and mid small bowel loops with air-fluid levels consistent with a small-bowel obstruction. Transition to distal decompressed small bowel loops and largely decompressed colon. The exact point obstruction is not identified for certain but appears to be in the midline of the lower abdomen. The appendix is normal. Vascular/Lymphatic: The aorta and branch vessels are patent. The major venous structures are patent. Small scattered mesenteric and retroperitoneal lymph nodes but no mass or adenopathy. Reproductive: Enlarged fibroid uterus with multiple exophytic/serosal fibroids. No definite adnexal masses. Other: Small anterior abdominal wall hernia containing fat. No subcutaneous lesions. Musculoskeletal: The bony structures are unremarkable. IMPRESSION: 1. CT findings consistent with a small-bowel obstruction. The exact point of obstruction is not identified for certain but appears to be in the midline of the lower abdomen. 2. Stable changes of interstitial lung disease. No acute superimposed pulmonary process. 3. Stable borderline mediastinal and hilar lymph nodes, likely reactive and due to the patient's underlying lung disease. 4. Bilateral renal calculi but no obstructing ureteral calculi or hydroureteronephrosis. 5. Enlarged fibroid uterus. 6. Status post cholecystectomy. No biliary dilatation. Electronically Signed   By: PMarijo SanesM.D.   On: 06/06/2022 17:02   CT HEAD WO CONTRAST  Result Date: 06/06/2022 CLINICAL DATA:  Mental status change, unknown cause EXAM: CT HEAD WITHOUT CONTRAST TECHNIQUE: Contiguous axial images were obtained from the base of the skull through the vertex without intravenous  contrast. RADIATION DOSE REDUCTION: This exam was performed according to the departmental dose-optimization program which includes automated exposure control, adjustment of the mA and/or kV according to patient size and/or use of iterative reconstruction technique. COMPARISON:  CT examination dated February 05, 2022 FINDINGS: Brain: No evidence of acute infarction, hemorrhage, hydrocephalus, extra-axial collection or mass lesion/mass effect. Vascular: No hyperdense vessel or unexpected calcification. Skull: Normal. Negative for fracture or focal lesion. Sinuses/Orbits: No acute finding. Other: None. IMPRESSION: No acute intracranial pathology. Electronically Signed   By: IKeane PoliceD.O.   On: 06/06/2022 16:19   DG Chest  Portable 1 View  Result Date: 06/06/2022 CLINICAL DATA:  Altered mental status EXAM: PORTABLE CHEST 1 VIEW COMPARISON:  Chest radiograph dated 01/13/2022 FINDINGS: Low lung volumes. Bibasilar patchy opacities, left-greater-than-right. No pleural effusion or pneumothorax. The heart size and mediastinal contours are within normal limits. The visualized skeletal structures are unremarkable. Right upper quadrant surgical clips. IMPRESSION: Low lung volumes with bibasilar patchy opacities, left-greater-than-right, likely atelectasis. Aspiration or pneumonia can be considered in the appropriate clinical setting. Electronically Signed   By: Darrin Nipper M.D.   On: 06/06/2022 15:33    SIGNED: Deatra James, MD, FHM. FAAFP. [facility] - [facility] Time spent > 35 min.  In seeing, evaluating and examining the patient. Reviewing medical records, labs, drawn plan of care. [facility],  Pager (please use [email or web address] to page/ text) Please use Epic Secure Chat for non-urgent communication (7AM-7PM)  If 7PM-7AM, please contact night-coverage [email or web address], 06/07/2022, 10:31 AM

## 2022-06-07 NOTE — Hospital Course (Addendum)
Karen [name] is a 52 y.o. female with medical history significant of Advanced scleroderma, GERD, CHF, Hypothyroidism, HTN, ILD on transplant list.    Presenting w/ abd pain and n/v. Started w/ diarrhea which resolved. Emesis is bilious. Symptoms started on day of admission. Associated w/ lethargy. No other sx or changes in overall health prior to admission.      CT chest/ABD - consistent w/ ILD. No acute pulmonary process. ABD consistent w/ SBO       Assessment & Plan:   Principal Problem:   SBO (small bowel obstruction) (HCC) Active Problems:   HTN (hypertension)   Scleroderma (HCC)   ILD (interstitial lung disease) ([place])   Intrinsic atopic dermatitis   Pulmonary hypertension (HCC)   Hypothyroidism, adult   GERD with esophagitis        SBO (small bowel obstruction) -  - Consulted Gen Surge. Currently no emesis. Suspect secondary to scleroderma - f/u Recs from General Surgery  -1 episode of vomiting earlier this morning -Tolerate soft diet -Passing gas -No bowel movement yet  Surgery once establish bowel function with BM can be discharged home    ILD (interstitial lung disease)  Advanced. Pt on transplant list. Currently stable on RA - Monitor - Once taking PO resume Cellcept, OFEV,    Scleroderma: Stable - continu ehome Tacrolimus and Triamcinolone topical PRN   HTN/Edema: - Resume Norvasc, Bisoprolol, Spironolactone - Lasix IV PRN - Hydralazine IV PRN   Hypothyroid  -  resume Synthroid

## 2022-06-07 NOTE — Progress Notes (Signed)
  Transition of Care [facility]) Screening Note   Patient Details  Name: Karen [name] Date of Birth: [birth year]   Transition of Care [facility]) CM/SW Contact:    Ihor Gully, LCSW Phone Number: 06/07/2022, 11:32 AM    Transition of Care Department [facility]) has reviewed patient and no TOC needs have been identified at this time. We will continue to monitor patient advancement through interdisciplinary progression rounds. If new patient transition needs arise, please place a TOC consult.

## 2022-06-07 NOTE — Consult Note (Signed)
Reason for Consult: Small bowel obstruction Referring Physician: Dr. Mervin Kung is an 52 y.o. female.  HPI: Patient is a 52 year old black female who presented to the emergency room with abdominal discomfort, nausea, vomiting, and diarrhea.  This started yesterday.  She has a past medical history is significant for scleroderma, GERD, CHF, hypothyroidism, and interstitial lung disease being evaluated by the transplant service at [facility].  Her emesis was bilious in nature.  She felt fatigued.  A CT scan of the abdomen and pelvis was consistent with a small bowel obstruction, but no specific transition zone was identified.  She was admitted to the hospital.  An NG tube was not placed.  She states this morning that she has had a small amount of watery discharge from her rectum.  Her abdominal pain seems to be somewhat eased.  She is hungry.  Past Medical History:  Diagnosis Date   Acute respiratory failure with hypoxia due to flash pulmonary edema requiring intubation 02/13/2020   Calculus of gallbladder with acute cholecystitis without obstruction    CHF (congestive heart failure) (HCC)    GERD (gastroesophageal reflux disease)    Hypertension    Hypothyroidism    ILD (interstitial lung disease) ([place]) 02/08/2018   Interstitial lung disease ([place])    Multinodular thyroid    benign FNA 08/2017.   Perimenopause 02/27/2019   Pulmonary edema    Scleroderma ([place])    Status post laparoscopic cholecystectomy 02/13/20 02/13/2020   Vitamin D deficiency disease 02/27/2019    Past Surgical History:  Procedure Laterality Date   ABDOMINAL HERNIA REPAIR     BUNIONECTOMY Bilateral 10/2018   CHOLECYSTECTOMY N/A 02/13/2020   Procedure: LAPAROSCOPIC CHOLECYSTECTOMY;  Surgeon: Aviva Signs, MD;  Location: [facility] ORS;  Service: General;  Laterality: N/A;   COLONOSCOPY WITH PROPOFOL N/A 01/02/2019   Procedure: COLONOSCOPY WITH PROPOFOL;  Surgeon: Daneil Dolin, MD;  Location: [facility] END[facility]  SU[facility];  Service: Endoscopy;  Laterality: N/A;  12:30pm   ESOPHAGOGASTRODUODENOSCOPY (EGD) WITH PROPOFOL N/A 01/28/2018   erosive reflux esophagitis, patulous EG Junction, no dilation, incomplete EGD due to retained food in stomach. GES thereafter with delayed gastric emptying.    RIGHT HEART CATH N/A 09/27/2017   Procedure: RIGHT HEART CATH;  Surgeon: Jolaine Artist, MD;  Location: [place] [facility];  Service: Cardiovascular;  Laterality: N/A;   RIGHT HEART CATH N/A 09/05/2019   Procedure: RIGHT HEART CATH;  Surgeon: Jolaine Artist, MD;  Location: [place] [facility];  Service: Cardiovascular;  Laterality: N/A;   RIGHT/LEFT HEART CATH AND CORONARY ANGIOGRAPHY N/A 08/18/2021   Procedure: RIGHT/LEFT HEART CATH AND CORONARY ANGIOGRAPHY;  Surgeon: Jolaine Artist, MD;  Location: [place] [facility];  Service: Cardiovascular;  Laterality: N/A;   UTERINE FIBROID SURGERY      Family History  Problem Relation Age of Onset   Hypertension Father    Hypertension Sister    Hypertension Brother    Diabetes Maternal Grandfather    Diabetes Maternal Uncle    Diabetes Mother    Colon cancer Neg Hx     Social History:  reports that she has never smoked. She has never used smokeless tobacco. She reports that she does not drink alcohol and does not use drugs.  Allergies:  Allergies  Allergen Reactions   Lisinopril Hives, Swelling and Other (See Comments)   Tocilizumab Other (See Comments)    Swelling and shortness of breath sharpe pains in back and chest tightness.  Medications: I have reviewed the patient's current medications. Prior to Admission:  Medications Prior to Admission  Medication Sig Dispense Refill Last Dose   acetaminophen (TYLENOL) 325 MG tablet Take 2 tablets (650 mg total) by mouth every 6 (six) hours as needed for mild pain, fever or headache (or Fever >/= 101). 12 tablet 0 unknown   amLODipine (NORVASC) 10 MG tablet Take 1 tablet (10 mg total) by mouth daily. 30  tablet 6 06/05/2022   benzonatate (TESSALON) 100 MG capsule Take 1 capsule (100 mg total) by mouth 2 (two) times daily as needed for cough. 30 capsule 1 Past Month   bisoprolol (ZEBETA) 5 MG tablet Take 1 tablet (5 mg total) by mouth daily. 90 tablet 3 06/05/2022 at 0930   clindamycin-benzoyl peroxide (BENZACLIN) gel Apply 1 Application topically as needed.   unknown   clobetasol cream (TEMOVATE) 0.03 % Apply 1 Application topically as needed.   unknown   esomeprazole (NEXIUM) 40 MG capsule Take 1 capsule (40 mg total) by mouth 2 (two) times daily. 180 capsule 1 06/05/2022   furosemide (LASIX) 40 MG tablet Take 1 tablet (40 mg total) by mouth daily as needed for edema. 30 tablet 6 06/05/2022   macitentan (OPSUMIT) 10 MG tablet Take 1 tablet (10 mg total) by mouth daily. 30 tablet 11 06/05/2022   Multiple Vitamins-Minerals (HM MULTIVITAMIN ADULT GUMMY PO) Take 2 tablets by mouth daily.   06/05/2022   mycophenolate (CELLCEPT) 500 MG tablet Take 3 tablets (1,500 mg total) by mouth every morning AND 3 tablets (1,500 mg total) every evening. 540 tablet 1 06/05/2022   Nintedanib (OFEV) 150 MG CAPS Take 1 capsule (150 mg total) by mouth 2 (two) times daily. 60 capsule 4 06/05/2022   NP THYROID 120 MG tablet Take 1 tablet (120 mg total) by mouth daily before breakfast. 90 tablet 0 06/05/2022   potassium chloride 20 MEQ/15ML (10%) SOLN Take 20 mEq by mouth as needed.   unknown   spironolactone (ALDACTONE) 25 MG tablet Take 1 tablet (25 mg total) by mouth 2 (two) times daily. 60 tablet 6 06/05/2022   tacrolimus (PROTOPIC) 0.1 % ointment Apply to dry skin on face and body daily to twice daily as needed. 60 g 5 unknown   triamcinolone cream (KENALOG) 0.1 % Apply 1 Application topically daily as needed.   unknown    Results for orders placed or performed during the hospital encounter of 06/06/22 (from the past 48 hour(s))  CBG monitoring, ED     Status: Abnormal   Collection Time: 06/06/22  2:26 PM  Result Value Ref  Range   Glucose-Capillary 69 (L) 70 - 99 mg/dL    Comment: Glucose reference range applies only to samples taken after fasting for at least 8 hours.   Comment 1 Document in Chart   CBC WITH DIFFERENTIAL     Status: Abnormal   Collection Time: 06/06/22  2:39 PM  Result Value Ref Range   WBC 11.6 (H) 4.0 - 10.5 K/uL   RBC 5.17 (H) 3.87 - 5.11 MIL/uL   Hemoglobin 14.7 12.0 - 15.0 g/dL   HCT 46.3 (H) 36.0 - 46.0 %   MCV 89.6 80.0 - 100.0 fL   MCH 28.4 26.0 - 34.0 pg   MCHC 31.7 30.0 - 36.0 g/dL   RDW 14.0 11.5 - 15.5 %   Platelets 227 150 - 400 K/uL   nRBC 0.0 0.0 - 0.2 %   Neutrophils Relative % 88 %   Neutro Abs 10.2 (  H) 1.7 - 7.7 K/uL   Lymphocytes Relative 9 %   Lymphs Abs 1.0 0.7 - 4.0 K/uL   Monocytes Relative 2 %   Monocytes Absolute 0.3 0.1 - 1.0 K/uL   Eosinophils Relative 1 %   Eosinophils Absolute 0.1 0.0 - 0.5 K/uL   Basophils Relative 0 %   Basophils Absolute 0.0 0.0 - 0.1 K/uL   Immature Granulocytes 0 %   Abs Immature Granulocytes 0.03 0.00 - 0.07 K/uL    Comment: Performed at [facility], [address], [place], [place] [address]  Basic metabolic panel     Status: Abnormal   Collection Time: 06/06/22  2:39 PM  Result Value Ref Range   Sodium 142 135 - 145 mmol/L   Potassium 3.6 3.5 - 5.1 mmol/L   Chloride 104 98 - 111 mmol/L   CO2 26 22 - 32 mmol/L   Glucose, Bld 124 (H) 70 - 99 mg/dL    Comment: Glucose reference range applies only to samples taken after fasting for at least 8 hours.   BUN 13 6 - 20 mg/dL   Creatinine, Ser 0.68 0.44 - 1.00 mg/dL   Calcium 11.2 (H) 8.9 - 10.3 mg/dL   GFR, Estimated >60 >60 mL/min    Comment: (NOTE) Calculated using the CKD-EPI Creatinine Equation (2021)    Anion gap 12 5 - 15    Comment: Performed at [facility], [address], [place], [place] [address]  Ammonia     Status: None   Collection Time: 06/06/22  2:39 PM  Result Value Ref Range   Ammonia 16 9 - 35 umol/L    Comment: SLIGHT HEMOLYSIS Performed at [facility], [address], [place], [place] [address]   Hepatic function panel     Status: Abnormal   Collection Time: 06/06/22  2:39 PM  Result Value Ref Range   Total Protein 8.6 (H) 6.5 - 8.1 g/dL   Albumin 4.8 3.5 - 5.0 g/dL   AST 23 15 - 41 U/L   ALT 16 0 - 44 U/L   Alkaline Phosphatase 99 38 - 126 U/L   Total Bilirubin 0.7 0.3 - 1.2 mg/dL   Bilirubin, Direct 0.1 0.0 - 0.2 mg/dL   Indirect Bilirubin 0.6 0.3 - 0.9 mg/dL    Comment: Performed at [facility], [address], [place], [place] [address]  Magnesium     Status: None   Collection Time: 06/06/22  2:39 PM  Result Value Ref Range   Magnesium 1.9 1.7 - 2.4 mg/dL    Comment: Performed at [facility], [address] [place], [place] [address]  hCG, quantitative, pregnancy     Status: None   Collection Time: 06/06/22  2:39 PM  Result Value Ref Range   hCG, Beta Chain, Quant, S 4 <5 mIU/mL    Comment:          GEST. AGE      CONC.  (mIU/mL)   <=1 WEEK        5 - 50     2 WEEKS       50 - 500     3 WEEKS       100 - 10,000     4 WEEKS     1,000 - 30,000     5 WEEKS     3,500 - 115,000   6-8 WEEKS     12,000 - 270,000    12 WEEKS     15,000 - 220,000  FEMALE AND NON-PREGNANT FEMALE:     LESS THAN 5 mIU/mL Performed at [facility], [address], [place], [place] [address]   Brain natriuretic peptide     Status: None   Collection Time: 06/06/22  2:39 PM  Result Value Ref Range   B Natriuretic Peptide 52.0 0.0 - 100.0 pg/mL    Comment: Performed at [facility], [address], [place], [place] [address]  Ethanol     Status: None   Collection Time: 06/06/22  2:40 PM  Result Value Ref Range   Alcohol, Ethyl (B) <10 <10 mg/dL    Comment: (NOTE) Lowest detectable limit for serum alcohol is 10 mg/dL.  For medical purposes only. Performed at [facility], [address] [place], [place] [address]   TSH     Status: None   Collection Time: 06/06/22  2:42 PM  Result Value Ref Range   TSH 0.898 0.350 - 4.500 uIU/mL     Comment: Performed by a 3rd Generation assay with a functional sensitivity of <=0.01 uIU/mL. Performed at [facility], [address], [place], [place] [address]   Blood gas, venous     Status: Abnormal   Collection Time: 06/06/22  4:07 PM  Result Value Ref Range   pH, Ven 7.39 7.25 - 7.43   pCO2, Ven 51 44 - 60 mmHg   pO2, Ven <31 (LL) 32 - 45 mmHg    Comment: CRITICAL RESULT CALLED TO, READ BACK BY AND VERIFIED WITH: SMALLWOOD,M ON 06/06/22 AT 1520 BY LOY,C    Bicarbonate 30.9 (H) 20.0 - 28.0 mmol/L   Acid-Base Excess 4.6 (H) 0.0 - 2.0 mmol/L   O2 Saturation 39.4 %   Patient temperature 36.8    Collection site RIGHT ANTECUBITAL    Drawn by [id number]     Comment: Performed at [facility], [address], [place], [place] [address]  Lactic acid, plasma     Status: None   Collection Time: 06/06/22  4:07 PM  Result Value Ref Range   Lactic Acid, Venous 1.8 0.5 - 1.9 mmol/L    Comment: Performed at [facility], [address] [place], [place] [address]  Lactic acid, plasma     Status: None   Collection Time: 06/06/22  5:33 PM  Result Value Ref Range   Lactic Acid, Venous 1.6 0.5 - 1.9 mmol/L    Comment: Performed at [facility], [address] [place], [place] [address]  HIV Antibody (routine testing w rflx)     Status: None   Collection Time: 06/07/22  4:17 AM  Result Value Ref Range   HIV Screen 4th Generation wRfx Non Reactive Non Reactive    Comment: Performed at [facility], [place] [address], [place], [place] [address]  CBC     Status: Abnormal   Collection Time: 06/07/22  4:17 AM  Result Value Ref Range   WBC 8.2 4.0 - 10.5 K/uL   RBC 4.34 3.87 - 5.11 MIL/uL   Hemoglobin 11.9 (L) 12.0 - 15.0 g/dL   HCT 39.6 36.0 - 46.0 %   MCV 91.2 80.0 - 100.0 fL   MCH 27.4 26.0 - 34.0 pg   MCHC 30.1 30.0 - 36.0 g/dL   RDW 13.9 11.5 - 15.5 %   Platelets 183 150 - 400 K/uL   nRBC 0.0 0.0 - 0.2 %    Comment: Performed at [facility], [address] [place], [place] [address]   Basic metabolic panel     Status: Abnormal  Collection Time: 06/07/22  4:17 AM  Result Value Ref Range   Sodium 141 135 - 145 mmol/L   Potassium 3.2 (L) 3.5 - 5.1 mmol/L   Chloride 109 98 - 111 mmol/L   CO2 26 22 - 32 mmol/L   Glucose, Bld 86 70 - 99 mg/dL    Comment: Glucose reference range applies only to samples taken after fasting for at least 8 hours.   BUN 12 6 - 20 mg/dL   Creatinine, Ser 0.65 0.44 - 1.00 mg/dL   Calcium 9.9 8.9 - 10.3 mg/dL   GFR, Estimated >60 >60 mL/min    Comment: (NOTE) Calculated using the CKD-EPI Creatinine Equation (2021)    Anion gap 6 5 - 15    Comment: Performed at [facility], [address], [place],  [address]    CT [place]  Result Date: 06/06/2022 CLINICAL DATA:  Chest and abdominal pain with nausea since this morning. EXAM: CT CHEST, ABDOMEN, AND PELVIS WITH CONTRAST TECHNIQUE: Multidetector CT imaging of the chest, abdomen and pelvis was performed following the standard protocol during bolus administration of intravenous contrast. RADIATION DOSE REDUCTION: This exam was performed according to the departmental dose-optimization program which includes automated exposure control, adjustment of the mA and/or kV according to patient size and/or use of iterative reconstruction technique. CONTRAST:  164m OMNIPAQUE IOHEXOL 300 MG/ML  SOLN COMPARISON:  Chest CT 11/10/2021 FINDINGS: CT CHEST FINDINGS Cardiovascular: The heart is normal in size. No pericardial effusion. The aorta is within normal limits in caliber. No dissection. Scattered atherosclerotic calcifications. No significant coronary artery calcifications. The pulmonary arteries appear normal. Mediastinum/Nodes: Stable borderline mediastinal hilar lymph nodes likely reactive and due to the patient's underlying lung disease. No new or progressive findings. The esophagus is grossly normal. Stable 2.5 cm right thyroid nodule and 2 cm left thyroid nodule. This has been  evaluated on previous imaging. (ref: J Am Coll Radiol. 2015 Feb;12(2): 143-50). Lungs/Pleura: Stable changes of interstitial lung disease. No acute superimposed pulmonary process. No worrisome pulmonary lesions or pulmonary nodules. Stable 5 mm perifissural nodule in the right middle lobe likely benign lymph node. The central tracheobronchial tree is unremarkable. Musculoskeletal: No breast masses, supraclavicular or axillary adenopathy. The bony thorax is intact. CT ABDOMEN PELVIS FINDINGS Hepatobiliary: No hepatic lesions or intrahepatic biliary dilatation. The gallbladder is surgically absent. No common bile duct dilatation. Pancreas: No mass, inflammation or ductal dilatation. Spleen: Normal size.  No focal lesions. Adrenals/Urinary Tract: The adrenal glands are normal. Bilateral renal calculi but no obstructing ureteral calculi or hydroureteronephrosis. No worrisome renal lesions. The delayed images do not demonstrate any significant collecting system abnormalities. Stomach/Bowel: The stomach and duodenum. There are dilated fluid-filled proximal and mid small bowel loops with air-fluid levels consistent with a small-bowel obstruction. Transition to distal decompressed small bowel loops and largely decompressed colon. The exact point obstruction is not identified for certain but appears to be in the midline of the lower abdomen. The appendix is normal. Vascular/Lymphatic: The aorta and branch vessels are patent. The major venous structures are patent. Small scattered mesenteric and retroperitoneal lymph nodes but no mass or adenopathy. Reproductive: Enlarged fibroid uterus with multiple exophytic/serosal fibroids. No definite adnexal masses. Other: Small anterior abdominal wall hernia containing fat. No subcutaneous lesions. Musculoskeletal: The bony structures are unremarkable. IMPRESSION: 1. CT findings consistent with a small-bowel obstruction. The exact point of obstruction is not identified for certain but  appears to be in the midline of the lower abdomen. 2.  Stable changes of interstitial lung disease. No acute superimposed pulmonary process. 3. Stable borderline mediastinal and hilar lymph nodes, likely reactive and due to the patient's underlying lung disease. 4. Bilateral renal calculi but no obstructing ureteral calculi or hydroureteronephrosis. 5. Enlarged fibroid uterus. 6. Status post cholecystectomy. No biliary dilatation. Electronically Signed   By: Marijo Sanes M.D.   On: 06/06/2022 17:02   CT HEAD WO CONTRAST  Result Date: 06/06/2022 CLINICAL DATA:  Mental status change, unknown cause EXAM: CT HEAD WITHOUT CONTRAST TECHNIQUE: Contiguous axial images were obtained from the base of the skull through the vertex without intravenous contrast. RADIATION DOSE REDUCTION: This exam was performed according to the departmental dose-optimization program which includes automated exposure control, adjustment of the mA and/or kV according to patient size and/or use of iterative reconstruction technique. COMPARISON:  CT examination dated February 05, 2022 FINDINGS: Brain: No evidence of acute infarction, hemorrhage, hydrocephalus, extra-axial collection or mass lesion/mass effect. Vascular: No hyperdense vessel or unexpected calcification. Skull: Normal. Negative for fracture or focal lesion. Sinuses/Orbits: No acute finding. Other: None. IMPRESSION: No acute intracranial pathology. Electronically Signed   By: Keane Police D.O.   On: 06/06/2022 16:19   DG Chest Portable 1 View  Result Date: 06/06/2022 CLINICAL DATA:  Altered mental status EXAM: PORTABLE CHEST 1 VIEW COMPARISON:  Chest radiograph dated 01/13/2022 FINDINGS: Low lung volumes. Bibasilar patchy opacities, left-greater-than-right. No pleural effusion or pneumothorax. The heart size and mediastinal contours are within normal limits. The visualized skeletal structures are unremarkable. Right upper quadrant surgical clips. IMPRESSION: Low lung volumes with  bibasilar patchy opacities, left-greater-than-right, likely atelectasis. Aspiration or pneumonia can be considered in the appropriate clinical setting. Electronically Signed   By: Darrin Nipper M.D.   On: 06/06/2022 15:33    ROS:  Pertinent items are noted in HPI.  Blood pressure 131/86, pulse 83, temperature 98.2 F (36.8 C), resp. rate 16, height '5\' 5"'$  (1.651 m), weight 62 kg, SpO2 96 %. Physical Exam: Pleasant black female no acute distress Head is normocephalic, atraumatic Lungs are clear to auscultation with equal breath sounds bilaterally Heart examination reveals regular rate and rhythm without S3, S4, murmurs Abdomen is soft with active bowel sounds appreciated.  It does not appear to be significantly distended.  No peritoneal signs are noted.  CT scan images personally reviewed  Assessment/Plan: Impression: Small bowel obstruction versus enteritis with a component of ileus.  It is encouraging that she is still having some watery stools.  No need for acute surgical invention at the present time.  Will start a clear liquid diet and see how she tolerates this.  Should she require surgical intervention, this may need to be done at a tertiary care center given her interstitial lung disease.  Aviva Signs 06/07/2022, 11:44 AM

## 2022-06-08 DIAGNOSIS — K56609 Unspecified intestinal obstruction, unspecified as to partial versus complete obstruction: Secondary | ICD-10-CM | POA: Diagnosis not present

## 2022-06-08 LAB — BASIC METABOLIC PANEL
Anion gap: 7 (ref 5–15)
BUN: 9 mg/dL (ref 6–20)
CO2: 24 mmol/L (ref 22–32)
Calcium: 9.6 mg/dL (ref 8.9–10.3)
Chloride: 108 mmol/L (ref 98–111)
Creatinine, Ser: 0.61 mg/dL (ref 0.44–1.00)
GFR, Estimated: >60 mL/min (ref >60)
Glucose, Bld: 85 mg/dL (ref 70–99)
Potassium: 3.4 mmol/L — ABNORMAL LOW (ref 3.5–5.1)
Sodium: 139 mmol/L (ref 135–145)

## 2022-06-08 MED ORDER — SPIRONOLACTONE 25 MG PO TABS
25.0000 mg | ORAL_TABLET | Freq: Two times a day (BID) | ORAL | Status: DC
  Administered 2022-06-08 – 2022-06-09 (×2): 25 mg via ORAL
  Filled 2022-06-08 (×2): qty 1

## 2022-06-08 MED ORDER — MAGNESIUM HYDROXIDE 400 MG/5ML PO SUSP: 30.0000 mL | Freq: Three times a day (TID) | ORAL | Status: DC

## 2022-06-08 MED ORDER — NINTEDANIB ESYLATE 150 MG PO CAPS: 150.0000 mg | ORAL_CAPSULE | Freq: Two times a day (BID) | ORAL | Status: DC

## 2022-06-08 MED ORDER — POTASSIUM CHLORIDE 20 MEQ PO PACK
20.0000 meq | PACK | Freq: Two times a day (BID) | ORAL | Status: AC
  Administered 2022-06-08 (×2): 20 meq via ORAL
  Filled 2022-06-08 (×2): qty 1

## 2022-06-08 MED ORDER — PANTOPRAZOLE SODIUM 40 MG PO TBEC
40.0000 mg | DELAYED_RELEASE_TABLET | Freq: Every day | ORAL | Status: DC
  Administered 2022-06-08 – 2022-06-09 (×2): 40 mg via ORAL
  Filled 2022-06-08 (×2): qty 1

## 2022-06-08 MED ORDER — BISOPROLOL FUMARATE 5 MG PO TABS
5.0000 mg | ORAL_TABLET | Freq: Every day | ORAL | Status: DC
  Administered 2022-06-08 – 2022-06-09 (×2): 5 mg via ORAL
  Filled 2022-06-08 (×2): qty 1

## 2022-06-08 MED ORDER — AMLODIPINE BESYLATE 5 MG PO TABS
10.0000 mg | ORAL_TABLET | Freq: Every day | ORAL | Status: DC
  Administered 2022-06-08 – 2022-06-09 (×2): 10 mg via ORAL
  Filled 2022-06-08 (×2): qty 2

## 2022-06-08 MED ORDER — MACITENTAN 10 MG PO TABS
10.0000 mg | ORAL_TABLET | Freq: Every day | ORAL | Status: DC
  Administered 2022-06-08 – 2022-06-09 (×2): 10 mg via ORAL
  Filled 2022-06-08 (×5): qty 1

## 2022-06-08 MED ORDER — MAGNESIUM HYDROXIDE 400 MG/5ML PO SUSP
30.0000 mL | Freq: Two times a day (BID) | ORAL | Status: DC
  Administered 2022-06-08 – 2022-06-09 (×3): 30 mL via ORAL
  Filled 2022-06-08 (×3): qty 30

## 2022-06-08 MED ORDER — FUROSEMIDE 40 MG PO TABS: 40.0000 mg | ORAL_TABLET | Freq: Every day | ORAL | Status: DC | PRN

## 2022-06-08 MED ORDER — MYCOPHENOLATE MOFETIL 250 MG PO CAPS
1500.0000 mg | ORAL_CAPSULE | Freq: Two times a day (BID) | ORAL | Status: DC
  Filled 2022-06-08: qty 6

## 2022-06-08 MED ORDER — THYROID 30 MG PO TABS
120.0000 mg | ORAL_TABLET | Freq: Every day | ORAL | Status: DC
  Administered 2022-06-09: 120 mg via ORAL
  Filled 2022-06-08: qty 4

## 2022-06-08 MED ORDER — DOCUSATE SODIUM 100 MG PO CAPS
200.0000 mg | ORAL_CAPSULE | Freq: Every day | ORAL | Status: DC
  Administered 2022-06-08 – 2022-06-09 (×2): 200 mg via ORAL
  Filled 2022-06-08 (×2): qty 2

## 2022-06-08 NOTE — Progress Notes (Signed)
Patient slept through the night no complaints of pain. Continued to monitor.

## 2022-06-08 NOTE — Progress Notes (Signed)
PROGRESS NOTE    Patient: Karen [name]                            PCP: Lindell Spar, MD                    DOB: 06-01-1970            DOA: 06/06/2022 [id number]             DOS: 06/08/2022, 1:37 PM   LOS: 2 days   Date of Service: The patient was seen and examined on 06/08/2022  Subjective:   The patient was seen and examined this morning stable tolerating p.o. reporting of passing gas but no bowel BM yet...   Brief Narrative:    Karen [name] is a 52 y.o. female with medical history significant of Advanced scleroderma, GERD, CHF, Hypothyroidism, HTN, ILD on transplant list.    Presenting w/ abd pain and n/v. Started w/ diarrhea which resolved. Emesis is bilious. Symptoms started on day of admission. Associated w/ lethargy. No other sx or changes in overall health prior to admission.      CT chest/ABD - consistent w/ ILD. No acute pulmonary process. ABD consistent w/ SBO       Assessment & Plan:   Principal Problem:   SBO (small bowel obstruction) (HCC) Active Problems:   HTN (hypertension)   Scleroderma (HCC)   ILD (interstitial lung disease) ([place])   Intrinsic atopic dermatitis   Pulmonary hypertension (HCC)   Hypothyroidism, adult   GERD with esophagitis        SBO (small bowel obstruction) -  - Consulted Gen Surge. Currently no emesis. Suspect secondary to scleroderma - f/u Recs from General Surgery  -1 episode of vomiting earlier this morning -From clear liquid diet to soft diet ----tolerating  -Passing gas but no report of BM yet  Surgery once establish bowel function with BM can be discharged home    ILD (interstitial lung disease)  Advanced. Pt on transplant list. Currently stable on RA - Monitor - Once taking PO resume Cellcept, OFEV,    Scleroderma: Stable - continu ehome Tacrolimus and Triamcinolone topical PRN   HTN/Edema: - Resume Norvasc, Bisoprolol, Spironolactone - Lasix IV PRN - Hydralazine IV PRN   Hypothyroid  -  resume  Synthroid     ----------------------------------------------------------------------------------------------------------------------------------------------- Nutritional status:  The patient's BMI is: Body mass index is 22.75 kg/m. I agree with the assessment and plan as outlined below: Nutrition Status:          ------------------------------------------------------------------------------------------------------------------------------------------------  DVT prophylaxis:  SCDs   Code Status:   Code Status: Full Code  Family Communication: Mother present at bedside-updated The above findings and plan of care has been discussed with patient (and family)  in detail,  they expressed understanding and agreement of above. -Advance care planning has been discussed.   Admission status:   Status is: Inpatient Remains inpatient appropriate because: N.p.o., IV fluids, needing close evaluation monitoring for   Disposition: From  - home             Planning for discharge in 1-2 days: to   Procedures:   No admission procedures for hospital encounter.   Antimicrobials:  Anti-infectives (From admission, onward)    None        Medication:   amLODipine  10 mg Oral Daily   bisoprolol  5 mg Oral Daily   docusate sodium  200 mg Oral  Daily   magnesium hydroxide  30 mL Oral BID   Nintedanib  150 mg Oral BID   pantoprazole  40 mg Oral Daily   polyethylene glycol  17 g Oral Daily   potassium chloride  20 mEq Oral BID   spironolactone  25 mg Oral BID   [START ON 06/09/2022] thyroid  120 mg Oral QAC breakfast    furosemide, hydrALAZINE, ondansetron **OR** ondansetron (ZOFRAN) IV, triamcinolone cream   Objective:   Vitals:   06/07/22 1318 06/07/22 2249 06/08/22 0551 06/08/22 1242  BP: 108/72 130/83 (!) 150/95 (!) 145/94  Pulse: 76 77 73 63  Resp:  15 19   Temp: 98.1 F (36.7 C) 98.6 F (37 C) 98.2 F (36.8 C) 98.3 F (36.8 C)  TempSrc: Oral   Oral  SpO2: 100% 95%  100% (!) 87%  Weight:      Height:        Intake/Output Summary (Last 24 hours) at 06/08/2022 1337 Last data filed at 06/08/2022 0900 Gross per 24 hour  Intake 875.72 ml  Output --  Net 875.72 ml   Filed Weights   06/06/22 1433  Weight: 62 kg     Physical examination:        General:  AAO x 3,  cooperative, no distress;   HEENT:  Normocephalic, PERRL, otherwise with in Normal limits   Neuro:  CNII-XII intact. , normal motor and sensation, reflexes intact   Lungs:   Clear to auscultation BL, Respirations unlabored,  No wheezes / crackles  Cardio:    S1/S2, RRR, No murmure, No Rubs or Gallops   Abdomen:  Soft, non-tender, bowel sounds active all four quadrants, no guarding or peritoneal signs.  Muscular  skeletal:  Limited exam -global generalized weaknesses - in bed, able to move all 4 extremities,   2+ pulses,  symmetric, No pitting edema  Skin:  Dry, warm to touch, negative for any Rashes,  Wounds: Please see nursing documentation         ------------------------------------------------------------------------------------------------------------------------------------------    LABs:     Latest Ref Rng & Units 06/07/2022    4:17 AM 06/06/2022    2:39 PM 01/13/2022    2:18 PM  CBC  WBC 4.0 - 10.5 K/uL 8.2  11.6  9.8   Hemoglobin 12.0 - 15.0 g/dL 11.9  14.7  13.7   Hematocrit 36.0 - 46.0 % 39.6  46.3  44.2   Platelets 150 - 400 K/uL 183  227  222       Latest Ref Rng & Units 06/08/2022    4:14 AM 06/07/2022    4:17 AM 06/06/2022    2:39 PM  CMP  Glucose 70 - 99 mg/dL 85  86  124   BUN 6 - 20 mg/dL '9  12  13   '$ Creatinine 0.44 - 1.00 mg/dL 0.61  0.65  0.68   Sodium 135 - 145 mmol/L 139  141  142   Potassium 3.5 - 5.1 mmol/L 3.4  3.2  3.6   Chloride 98 - 111 mmol/L 108  109  104   CO2 22 - 32 mmol/L '24  26  26   '$ Calcium 8.9 - 10.3 mg/dL 9.6  9.9  11.2   Total Protein 6.5 - 8.1 g/dL   8.6   Total Bilirubin 0.3 - 1.2 mg/dL   0.7   Alkaline Phos 38 - 126 U/L    99   AST 15 - 41 U/L   23   ALT 0 -  44 U/L   16        Micro Results No results found for this or any previous visit (from the past 240 hour(s)).  Radiology Reports No results found.  SIGNED: Deatra James, MD, FHM. FAAFP. Zac[facility] Pontes - [facility] Time spent > 35 min.  In seeing, evaluating and examining the patient. Reviewing medical records, labs, drawn plan of care. [facility],  Pager (please use [email or web address] to page/ text) Please use Epic Secure Chat for non-urgent communication (7AM-7PM)  If 7PM-7AM, please contact night-coverage [email or web address], 06/08/2022, 1:37 PM

## 2022-06-08 NOTE — Progress Notes (Signed)
Subjective: Patient still passing gas.  Tolerating clear liquid diet well.  Has not had a significant bowel movement yet.  Denies any abdominal pain.  Patient reports she has a history of bowel dysmotility and slowing.  Objective: Vital signs in last 24 hours: Temp:  [98.1 F (36.7 C)-98.6 F (37 C)] 98.2 F (36.8 C) (02/01 0551) Pulse Rate:  [73-77] 73 (02/01 0551) Resp:  [15-19] 19 (02/01 0551) BP: (108-150)/(72-95) 150/95 (02/01 0551) SpO2:  [95 %-100 %] 100 % (02/01 0551) Last BM Date : 06/06/22  Intake/Output from previous day: 01/31 0701 - 02/01 0700 In: 995.7 [P.O.:720; I.V.:275.7] Out: -  Intake/Output this shift: No intake/output data recorded.  General appearance: alert, cooperative, and no distress GI: Soft, nontender, nondistended.  Bowel sounds appreciated, though decreased.  Lab Results:  Recent Labs    06/06/22 1439 06/07/22 0417  WBC 11.6* 8.2  HGB 14.7 11.9*  HCT 46.3* 39.6  PLT 227 183   BMET Recent Labs    06/07/22 0417 06/08/22 0414  NA 141 139  K 3.2* 3.4*  CL 109 108  CO2 26 24  GLUCOSE 86 85  BUN 12 9  CREATININE 0.65 0.61  CALCIUM 9.9 9.6   PT/INR No results for input(s): "LABPROT", "INR" in the last 72 hours.  Studies/Results: CT CHEST ABDOMEN PELVIS W CONTRAST  Result Date: 06/06/2022 CLINICAL DATA:  Chest and abdominal pain with nausea since this morning. EXAM: CT CHEST, ABDOMEN, AND PELVIS WITH CONTRAST TECHNIQUE: Multidetector CT imaging of the chest, abdomen and pelvis was performed following the standard protocol during bolus administration of intravenous contrast. RADIATION DOSE REDUCTION: This exam was performed according to the departmental dose-optimization program which includes automated exposure control, adjustment of the mA and/or kV according to patient size and/or use of iterative reconstruction technique. CONTRAST:  148m OMNIPAQUE IOHEXOL 300 MG/ML  SOLN COMPARISON:  Chest CT 11/10/2021 FINDINGS: CT CHEST FINDINGS  Cardiovascular: The heart is normal in size. No pericardial effusion. The aorta is within normal limits in caliber. No dissection. Scattered atherosclerotic calcifications. No significant coronary artery calcifications. The pulmonary arteries appear normal. Mediastinum/Nodes: Stable borderline mediastinal hilar lymph nodes likely reactive and due to the patient's underlying lung disease. No new or progressive findings. The esophagus is grossly normal. Stable 2.5 cm right thyroid nodule and 2 cm left thyroid nodule. This has been evaluated on previous imaging. (ref: J Am Coll Radiol. 2015 Feb;12(2): 143-50). Lungs/Pleura: Stable changes of interstitial lung disease. No acute superimposed pulmonary process. No worrisome pulmonary lesions or pulmonary nodules. Stable 5 mm perifissural nodule in the right middle lobe likely benign lymph node. The central tracheobronchial tree is unremarkable. Musculoskeletal: No breast masses, supraclavicular or axillary adenopathy. The bony thorax is intact. CT ABDOMEN PELVIS FINDINGS Hepatobiliary: No hepatic lesions or intrahepatic biliary dilatation. The gallbladder is surgically absent. No common bile duct dilatation. Pancreas: No mass, inflammation or ductal dilatation. Spleen: Normal size.  No focal lesions. Adrenals/Urinary Tract: The adrenal glands are normal. Bilateral renal calculi but no obstructing ureteral calculi or hydroureteronephrosis. No worrisome renal lesions. The delayed images do not demonstrate any significant collecting system abnormalities. Stomach/Bowel: The stomach and duodenum. There are dilated fluid-filled proximal and mid small bowel loops with air-fluid levels consistent with a small-bowel obstruction. Transition to distal decompressed small bowel loops and largely decompressed colon. The exact point obstruction is not identified for certain but appears to be in the midline of the lower abdomen. The appendix is normal. Vascular/Lymphatic: The aorta and  branch vessels  are patent. The major venous structures are patent. Small scattered mesenteric and retroperitoneal lymph nodes but no mass or adenopathy. Reproductive: Enlarged fibroid uterus with multiple exophytic/serosal fibroids. No definite adnexal masses. Other: Small anterior abdominal wall hernia containing fat. No subcutaneous lesions. Musculoskeletal: The bony structures are unremarkable. IMPRESSION: 1. CT findings consistent with a small-bowel obstruction. The exact point of obstruction is not identified for certain but appears to be in the midline of the lower abdomen. 2. Stable changes of interstitial lung disease. No acute superimposed pulmonary process. 3. Stable borderline mediastinal and hilar lymph nodes, likely reactive and due to the patient's underlying lung disease. 4. Bilateral renal calculi but no obstructing ureteral calculi or hydroureteronephrosis. 5. Enlarged fibroid uterus. 6. Status post cholecystectomy. No biliary dilatation. Electronically Signed   By: Marijo Sanes M.D.   On: 06/06/2022 17:02   CT HEAD WO CONTRAST  Result Date: 06/06/2022 CLINICAL DATA:  Mental status change, unknown cause EXAM: CT HEAD WITHOUT CONTRAST TECHNIQUE: Contiguous axial images were obtained from the base of the skull through the vertex without intravenous contrast. RADIATION DOSE REDUCTION: This exam was performed according to the departmental dose-optimization program which includes automated exposure control, adjustment of the mA and/or kV according to patient size and/or use of iterative reconstruction technique. COMPARISON:  CT examination dated February 05, 2022 FINDINGS: Brain: No evidence of acute infarction, hemorrhage, hydrocephalus, extra-axial collection or mass lesion/mass effect. Vascular: No hyperdense vessel or unexpected calcification. Skull: Normal. Negative for fracture or focal lesion. Sinuses/Orbits: No acute finding. Other: None. IMPRESSION: No acute intracranial pathology.  Electronically Signed   By: Keane Police D.O.   On: 06/06/2022 16:19   DG Chest Portable 1 View  Result Date: 06/06/2022 CLINICAL DATA:  Altered mental status EXAM: PORTABLE CHEST 1 VIEW COMPARISON:  Chest radiograph dated 01/13/2022 FINDINGS: Low lung volumes. Bibasilar patchy opacities, left-greater-than-right. No pleural effusion or pneumothorax. The heart size and mediastinal contours are within normal limits. The visualized skeletal structures are unremarkable. Right upper quadrant surgical clips. IMPRESSION: Low lung volumes with bibasilar patchy opacities, left-greater-than-right, likely atelectasis. Aspiration or pneumonia can be considered in the appropriate clinical setting. Electronically Signed   By: Darrin Nipper M.D.   On: 06/06/2022 15:33    Anti-infectives: Anti-infectives (From admission, onward)    None       Assessment/Plan: Impression: Partial small bowel obstruction versus bowel dysmotility.  Favor the latter.  No need for surgical intervention at the present time.  Surgery would be at increased risk given her comorbidities. Plan: Will advance to full liquid diet.  Will add milk of magnesia to her regimen given her history of bowel dysmotility.  May advance diet as tolerated.  LOS: 2 days    Aviva Signs 06/08/2022

## 2022-06-08 NOTE — Progress Notes (Signed)
Monitoring by Pharmacy for Pulmonary Hypertension Treatment   Indication - Continuation of prior to admission medication   Patient is 52 y.o.  with history of PAH on chronic macitentan (OPSUMIT) PTA and will be continued while hospitalized.   Continuing this medication order as an inpatient requires that monitoring parameters per REMS requirements must be met.  Chronic therapy is under the supervision of Dr. Haroldine Laws who is enrolled in the REMS program and is being notified of continuation of therapy. A staff message in EPIC has been sent notifying the certified prescriber.  Per patient report has previously been educated on Pregnancy risk and Hepatotoxicity. On admission pregnancy risk has been assessed and pregnancy test dated 06/06/22 was negative, and LMP 05/11/19.  Hepatic function has been evaluated. AST / ALT appropriate to continue medication at this time.     Latest Ref Rng & Units 06/06/2022    2:39 PM 11/22/2020   12:00 AM 07/15/2020   12:00 AM  Hepatic Function  Total Protein 6.5 - 8.1 g/dL 8.6  7.9  7.3   Albumin 3.5 - 5.0 g/dL 4.8  4.7    AST 15 - 41 U/L '23  18  15   '$ ALT 0 - 44 U/L '16  9  8   '$ Alk Phosphatase 38 - 126 U/L 99  110    Total Bilirubin 0.3 - 1.2 mg/dL 0.7  0.4  0.4   Bilirubin, Direct 0.0 - 0.2 mg/dL 0.1       If any question arise or pregnancy is identified during hospitalization, contact for bosentan: [phone]; macitentan: [phone]; ambrisentan: [phone].  Thank for you allowing [place] to participate in the care of this patient.  Ramond Craver 06/08/2022, 3:13 PM Clinical Pharmacist  Guides for Female Patient:  ambrisentan (LETAIRIS), macitentan (OPSUMIT), bosentan (TRACLEER).

## 2022-06-09 ENCOUNTER — Other Ambulatory Visit (HOSPITAL_COMMUNITY): Payer: Self-pay

## 2022-06-09 DIAGNOSIS — K56609 Unspecified intestinal obstruction, unspecified as to partial versus complete obstruction: Secondary | ICD-10-CM | POA: Diagnosis not present

## 2022-06-09 LAB — BASIC METABOLIC PANEL
Anion gap: 9 (ref 5–15)
BUN: 8 mg/dL (ref 6–20)
CO2: 25 mmol/L (ref 22–32)
Calcium: 10 mg/dL (ref 8.9–10.3)
Chloride: 105 mmol/L (ref 98–111)
Creatinine, Ser: 0.67 mg/dL (ref 0.44–1.00)
GFR, Estimated: >60 mL/min (ref >60)
Glucose, Bld: 91 mg/dL (ref 70–99)
Potassium: 3.8 mmol/L (ref 3.5–5.1)
Sodium: 139 mmol/L (ref 135–145)

## 2022-06-09 MED ORDER — MILK AND MOLASSES ENEMA: 1.0000 | Freq: Once | RECTAL | Status: DC

## 2022-06-09 MED ORDER — DOCUSATE SODIUM 100 MG PO CAPS
200.0000 mg | ORAL_CAPSULE | Freq: Every day | ORAL | 0 refills | Status: DC
  Filled 2022-06-09: qty 10, 5d supply, fill #0

## 2022-06-09 NOTE — Progress Notes (Signed)
  Subjective: Patient passing gas and tolerating diet well without nausea or vomiting.  No significant abdominal pain noted.  Still has not had a bowel movement yet.  Objective: Vital signs in last 24 hours: Temp:  [98 F (36.7 C)-98.4 F (36.9 C)] 98.4 F (36.9 C) (02/02 0509) Pulse Rate:  [61-66] 61 (02/02 0509) Resp:  [16-18] 16 (02/02 0509) BP: (118-145)/(84-94) 128/84 (02/02 0509) SpO2:  [87 %-100 %] 100 % (02/02 0509) Last BM Date : 06/06/22  Intake/Output from previous day: 02/01 0701 - 02/02 0700 In: 720 [P.O.:720] Out: -  Intake/Output this shift: No intake/output data recorded.  General appearance: alert, cooperative, and no distress GI: soft, non-tender; bowel sounds normal; no masses,  no organomegaly  Lab Results:  Recent Labs    06/06/22 1439 06/07/22 0417  WBC 11.6* 8.2  HGB 14.7 11.9*  HCT 46.3* 39.6  PLT 227 183   BMET Recent Labs    06/08/22 0414 06/09/22 0412  NA 139 139  K 3.4* 3.8  CL 108 105  CO2 24 25  GLUCOSE 85 91  BUN 9 8  CREATININE 0.61 0.67  CALCIUM 9.6 10.0   PT/INR No results for input(s): "LABPROT", "INR" in the last 72 hours.  Studies/Results: No results found.  Anti-infectives: Anti-infectives (From admission, onward)    None       Assessment/Plan: Impression: Bowel dysmotility with constipation.  Small bowel obstruction less likely. Plan: Will give molasses enema this morning.  Should patient started having bowel movements, okay for discharge from surgery standpoint.  LOS: 3 days    Aviva Signs 06/09/2022

## 2022-06-09 NOTE — Progress Notes (Signed)
Patient slept thought the night, no complaint of pain. Continued to monitor patient.

## 2022-06-09 NOTE — Progress Notes (Signed)
PROGRESS NOTE    Patient: Karen [name]                            PCP: Lindell Spar, MD                    DOB: 27-Aug-1970            DOA: 06/06/2022 [id number]             DOS: 06/09/2022, 10:57 AM   LOS: 3 days   Date of Service: The patient was seen and examined on 06/09/2022  Subjective:   Patient was seen and examined, walk in the hallway, positive gas no bowel movement yet   Brief Narrative:    Karen [name] is a 52 y.o. female with medical history significant of Advanced scleroderma, GERD, CHF, Hypothyroidism, HTN, ILD on transplant list.    Presenting w/ abd pain and n/v. Started w/ diarrhea which resolved. Emesis is bilious. Symptoms started on day of admission. Associated w/ lethargy. No other sx or changes in overall health prior to admission.      CT chest/ABD - consistent w/ ILD. No acute pulmonary process. ABD consistent w/ SBO       Assessment & Plan:   Principal Problem:   SBO (small bowel obstruction) (HCC) Active Problems:   HTN (hypertension)   Scleroderma (HCC)   ILD (interstitial lung disease) ([place])   Intrinsic atopic dermatitis   Pulmonary hypertension (HCC)   Hypothyroidism, adult   GERD with esophagitis        SBO (small bowel obstruction) -  - Consulted Gen Surge. Currently no emesis. Suspect secondary to scleroderma - f/u Recs from General Surgery  -1 episode of vomiting earlier this morning -Tolerate soft diet -Passing gas -No bowel movement yet  Surgery once establish bowel function with BM can be discharged home    ILD (interstitial lung disease)  Advanced. Pt on transplant list. Currently stable on RA - Monitor - Once taking PO resume Cellcept, OFEV,    Scleroderma: Stable - continu ehome Tacrolimus and Triamcinolone topical PRN   HTN/Edema: - Resume Norvasc, Bisoprolol, Spironolactone - Lasix IV PRN - Hydralazine IV PRN   Hypothyroid  -  resume  Synthroid     ----------------------------------------------------------------------------------------------------------------------------------------------- Nutritional status:  The patient's BMI is: Body mass index is 22.75 kg/m. I agree with the assessment and plan as outlined below: Nutrition Status:          ------------------------------------------------------------------------------------------------------------------------------------------------  DVT prophylaxis:  SCDs   Code Status:   Code Status: Full Code  Family Communication: Mother present at bedside-updated The above findings and plan of care has been discussed with patient (and family)  in detail,  they expressed understanding and agreement of above. -Advance care planning has been discussed.   Admission status:   Status is: Inpatient Remains inpatient appropriate because: N.p.o., IV fluids, needing close evaluation monitoring for   Disposition: From  - home             Planning for discharge as soon as patient having bowel movement  Procedures:   No admission procedures for hospital encounter.   Antimicrobials:  Anti-infectives (From admission, onward)    None        Medication:   amLODipine  10 mg Oral Daily   bisoprolol  5 mg Oral Daily   docusate sodium  200 mg Oral Daily   macitentan  10 mg Oral Daily  magnesium hydroxide  30 mL Oral BID   milk and molasses  1 enema Rectal Once   mycophenolate  1,500 mg Oral BID   Nintedanib  150 mg Oral BID   pantoprazole  40 mg Oral Daily   polyethylene glycol  17 g Oral Daily   spironolactone  25 mg Oral BID   thyroid  120 mg Oral QAC breakfast    furosemide, hydrALAZINE, ondansetron **OR** ondansetron (ZOFRAN) IV, triamcinolone cream   Objective:   Vitals:   06/08/22 0551 06/08/22 1242 06/08/22 2123 06/09/22 0509  BP: (!) 150/95 (!) 145/94 118/86 128/84  Pulse: 73 63 66 61  Resp: '19  18 16  '$ Temp: 98.2 F (36.8 C) 98.3 F (36.8  C) 98 F (36.7 C) 98.4 F (36.9 C)  TempSrc:  Oral  Oral  SpO2: 100% (!) 87% 100% 100%  Weight:      Height:        Intake/Output Summary (Last 24 hours) at 06/09/2022 1057 Last data filed at 06/08/2022 1700 Gross per 24 hour  Intake 600 ml  Output --  Net 600 ml   Filed Weights   06/06/22 1433  Weight: 62 kg     Physical examination:        General:  AAO x 3,  cooperative, no distress;   HEENT:  Normocephalic, PERRL, otherwise with in Normal limits   Neuro:  CNII-XII intact. , normal motor and sensation, reflexes intact   Lungs:   Clear to auscultation BL, Respirations unlabored,  No wheezes / crackles  Cardio:    S1/S2, RRR, No murmure, No Rubs or Gallops   Abdomen:  Soft, non-tender, bowel sounds active all four quadrants, no guarding or peritoneal signs.  Muscular  skeletal:  Limited exam -global generalized weaknesses - in bed, able to move all 4 extremities,   2+ pulses,  symmetric, No pitting edema  Skin:  Dry, warm to touch, negative for any Rashes,  Wounds: Please see nursing documentation          ------------------------------------------------------------------------------------------------------------------------------------------    LABs:     Latest Ref Rng & Units 06/07/2022    4:17 AM 06/06/2022    2:39 PM 01/13/2022    2:18 PM  CBC  WBC 4.0 - 10.5 K/uL 8.2  11.6  9.8   Hemoglobin 12.0 - 15.0 g/dL 11.9  14.7  13.7   Hematocrit 36.0 - 46.0 % 39.6  46.3  44.2   Platelets 150 - 400 K/uL 183  227  222       Latest Ref Rng & Units 06/09/2022    4:12 AM 06/08/2022    4:14 AM 06/07/2022    4:17 AM  CMP  Glucose 70 - 99 mg/dL 91  85  86   BUN 6 - 20 mg/dL '8  9  12   '$ Creatinine 0.44 - 1.00 mg/dL 0.67  0.61  0.65   Sodium 135 - 145 mmol/L 139  139  141   Potassium 3.5 - 5.1 mmol/L 3.8  3.4  3.2   Chloride 98 - 111 mmol/L 105  108  109   CO2 22 - 32 mmol/L '25  24  26   '$ Calcium 8.9 - 10.3 mg/dL 10.0  9.6  9.9        Micro Results No results  found for this or any previous visit (from the past 240 hour(s)).  Radiology Reports No results found.  SIGNED: Deatra James, MD, FHM. FAAFP. [facility] Pontes - [facility] Time  spent > 35 min.  In seeing, evaluating and examining the patient. Reviewing medical records, labs, drawn plan of care. [facility],  Pager (please use [email or web address] to page/ text) Please use Epic Secure Chat for non-urgent communication (7AM-7PM)  If 7PM-7AM, please contact night-coverage [email or web address], 06/09/2022, 10:57 AM

## 2022-06-09 NOTE — Discharge Summary (Signed)
Physician Discharge Summary   Patient: Karen [name] MRN: [id number] DOB: 02/11/1971  Admit date:     06/06/2022  Discharge date: 06/09/22  Discharge Physician: Deatra James   PCP: Lindell Spar, MD   Recommendations at discharge:   Follow with a PCP in 2-4 weeks Continue current home medication with stool softeners  Discharge Diagnoses: Principal Problem:   SBO (small bowel obstruction) ([place]) Active Problems:   HTN (hypertension)   Scleroderma ([place])   ILD (interstitial lung disease) ([place])   Intrinsic atopic dermatitis   Pulmonary hypertension ([facility])   Hypothyroidism, adult   GERD with esophagitis  Resolved Problems:   * No resolved hospital problems. *  Hospital Course:  Karen [name] is a 52 y.o. female with medical history significant of Advanced scleroderma, GERD, CHF, Hypothyroidism, HTN, ILD on transplant list.    Presenting w/ abd pain and n/v. Started w/ diarrhea which resolved. Emesis is bilious. Symptoms started on day of admission. Associated w/ lethargy. No other sx or changes in overall health prior to admission.      CT chest/ABD - consistent w/ ILD. No acute pulmonary process. ABD consistent w/ SBO      SBO (small bowel obstruction) - Resolved, status post bowel movements  - Consulted Gen Surge. Currently no emesis. Suspect secondary to scleroderma - f/u Recs from General Surgery  -1 episode of vomiting earlier this morning -Tolerate soft diet--[place] as tolerated -Reporting gas and bowel movements     ILD (interstitial lung disease)  Advanced. Pt on transplant list. Currently stable on RA - Monitor Resume Cellcept, OFEV,    Scleroderma: Stable - continu ehome Tacrolimus and Triamcinolone topical PRN   HTN/Edema: - Resume Norvasc, Bisoprolol, Spironolactone    Hypothyroid  -  resume Synthroid     Consultants: General surgery  Disposition: Home Diet recommendation:  Discharge Diet Orders (From admission, onward)     Start      Ordered   06/09/22 0000  Diet - low sodium heart healthy        06/09/22 1432           Regular diet DISCHARGE MEDICATION: Allergies as of 06/09/2022       Reactions   Lisinopril Hives, Swelling, Other (See Comments)   Tocilizumab Other (See Comments)   Swelling and shortness of breath sharpe pains in back and chest tightness.        Medication List     TAKE these medications    acetaminophen 325 MG tablet Commonly known as: TYLENOL Take 2 tablets (650 mg total) by mouth every 6 (six) hours as needed for mild pain, fever or headache (or Fever >/= 101).   amLODipine 10 MG tablet Commonly known as: NORVASC Take 1 tablet (10 mg total) by mouth daily.   benzonatate 100 MG capsule Commonly known as: TESSALON Take 1 capsule (100 mg total) by mouth 2 (two) times daily as needed for cough.   bisoprolol 5 MG tablet Commonly known as: ZEBETA Take 1 tablet (5 mg total) by mouth daily.   clindamycin-benzoyl peroxide gel Commonly known as: BENZACLIN Apply 1 Application topically as needed.   clobetasol cream 0.05 % Commonly known as: TEMOVATE Apply 1 Application topically as needed.   docusate sodium 100 MG capsule Commonly known as: COLACE Take 2 capsules (200 mg total) by mouth daily. Start taking on: June 10, 2022   esomeprazole 40 MG capsule Commonly known as: NEXIUM Take 1 capsule (40 mg total) by mouth 2 (two) times daily.  furosemide 40 MG tablet Commonly known as: LASIX Take 1 tablet (40 mg total) by mouth daily as needed for edema.   HM MULTIVITAMIN ADULT GUMMY PO Take 2 tablets by mouth daily.   macitentan 10 MG tablet Commonly known as: OPSUMIT Take 1 tablet (10 mg total) by mouth daily.   mycophenolate 500 MG tablet Commonly known as: CELLCEPT Take 3 tablets (1,500 mg total) by mouth every morning AND 3 tablets (1,500 mg total) every evening.   NP Thyroid 120 MG tablet Generic drug: thyroid Take 1 tablet (120 mg total) by mouth daily  before breakfast.   Ofev 150 MG Caps Generic drug: Nintedanib Take 1 capsule (150 mg total) by mouth 2 (two) times daily.   potassium chloride 20 MEQ/15ML (10%) Soln Take 20 mEq by mouth as needed.   spironolactone 25 MG tablet Commonly known as: ALDACTONE Take 1 tablet (25 mg total) by mouth 2 (two) times daily.   tacrolimus 0.1 % ointment Commonly known as: PROTOPIC Apply to dry skin on face and body daily to twice daily as needed.   triamcinolone cream 0.1 % Commonly known as: KENALOG Apply 1 Application topically daily as needed.        Discharge Exam: Filed Weights   06/06/22 1433  Weight: 62 kg     General:  AAO x 3,  cooperative, no distress;   HEENT:  Normocephalic, PERRL, otherwise with in Normal limits   Neuro:  CNII-XII intact. , normal motor and sensation, reflexes intact   Lungs:   Clear to auscultation BL, Respirations unlabored,  No wheezes / crackles  Cardio:    S1/S2, RRR, No murmure, No Rubs or Gallops   Abdomen:  Soft, non-tender, bowel sounds active all four quadrants, no guarding or peritoneal signs.  Muscular  skeletal:  Limited exam -global generalized weaknesses - in bed, able to move all 4 extremities,   2+ pulses,  symmetric, No pitting edema  Skin:  Dry, warm to touch, negative for any Rashes,  Wounds: Please see nursing documentation          Condition at discharge: good  The results of significant diagnostics from this hospitalization (including imaging, microbiology, ancillary and laboratory) are listed below for reference.   Imaging Studies: CT CHEST ABDOMEN PELVIS W CONTRAST  Result Date: 06/06/2022 CLINICAL DATA:  Chest and abdominal pain with nausea since this morning. EXAM: CT CHEST, ABDOMEN, AND PELVIS WITH CONTRAST TECHNIQUE: Multidetector CT imaging of the chest, abdomen and pelvis was performed following the standard protocol during bolus administration of intravenous contrast. RADIATION DOSE REDUCTION: This exam was  performed according to the departmental dose-optimization program which includes automated exposure control, adjustment of the mA and/or kV according to patient size and/or use of iterative reconstruction technique. CONTRAST:  117m OMNIPAQUE IOHEXOL 300 MG/ML  SOLN COMPARISON:  Chest CT 11/10/2021 FINDINGS: CT CHEST FINDINGS Cardiovascular: The heart is normal in size. No pericardial effusion. The aorta is within normal limits in caliber. No dissection. Scattered atherosclerotic calcifications. No significant coronary artery calcifications. The pulmonary arteries appear normal. Mediastinum/Nodes: Stable borderline mediastinal hilar lymph nodes likely reactive and due to the patient's underlying lung disease. No new or progressive findings. The esophagus is grossly normal. Stable 2.5 cm right thyroid nodule and 2 cm left thyroid nodule. This has been evaluated on previous imaging. (ref: J Am Coll Radiol. 2015 Feb;12(2): 143-50). Lungs/Pleura: Stable changes of interstitial lung disease. No acute superimposed pulmonary process. No worrisome pulmonary lesions or pulmonary nodules. Stable 5 mm  perifissural nodule in the right middle lobe likely benign lymph node. The central tracheobronchial tree is unremarkable. Musculoskeletal: No breast masses, supraclavicular or axillary adenopathy. The bony thorax is intact. CT ABDOMEN PELVIS FINDINGS Hepatobiliary: No hepatic lesions or intrahepatic biliary dilatation. The gallbladder is surgically absent. No common bile duct dilatation. Pancreas: No mass, inflammation or ductal dilatation. Spleen: Normal size.  No focal lesions. Adrenals/Urinary Tract: The adrenal glands are normal. Bilateral renal calculi but no obstructing ureteral calculi or hydroureteronephrosis. No worrisome renal lesions. The delayed images do not demonstrate any significant collecting system abnormalities. Stomach/Bowel: The stomach and duodenum. There are dilated fluid-filled proximal and mid small bowel  loops with air-fluid levels consistent with a small-bowel obstruction. Transition to distal decompressed small bowel loops and largely decompressed colon. The exact point obstruction is not identified for certain but appears to be in the midline of the lower abdomen. The appendix is normal. Vascular/Lymphatic: The aorta and branch vessels are patent. The major venous structures are patent. Small scattered mesenteric and retroperitoneal lymph nodes but no mass or adenopathy. Reproductive: Enlarged fibroid uterus with multiple exophytic/serosal fibroids. No definite adnexal masses. Other: Small anterior abdominal wall hernia containing fat. No subcutaneous lesions. Musculoskeletal: The bony structures are unremarkable. IMPRESSION: 1. CT findings consistent with a small-bowel obstruction. The exact point of obstruction is not identified for certain but appears to be in the midline of the lower abdomen. 2. Stable changes of interstitial lung disease. No acute superimposed pulmonary process. 3. Stable borderline mediastinal and hilar lymph nodes, likely reactive and due to the patient's underlying lung disease. 4. Bilateral renal calculi but no obstructing ureteral calculi or hydroureteronephrosis. 5. Enlarged fibroid uterus. 6. Status post cholecystectomy. No biliary dilatation. Electronically Signed   By: Marijo Sanes M.D.   On: 06/06/2022 17:02   CT HEAD WO CONTRAST  Result Date: 06/06/2022 CLINICAL DATA:  Mental status change, unknown cause EXAM: CT HEAD WITHOUT CONTRAST TECHNIQUE: Contiguous axial images were obtained from the base of the skull through the vertex without intravenous contrast. RADIATION DOSE REDUCTION: This exam was performed according to the departmental dose-optimization program which includes automated exposure control, adjustment of the mA and/or kV according to patient size and/or use of iterative reconstruction technique. COMPARISON:  CT examination dated February 05, 2022 FINDINGS: Brain:  No evidence of acute infarction, hemorrhage, hydrocephalus, extra-axial collection or mass lesion/mass effect. Vascular: No hyperdense vessel or unexpected calcification. Skull: Normal. Negative for fracture or focal lesion. Sinuses/Orbits: No acute finding. Other: None. IMPRESSION: No acute intracranial pathology. Electronically Signed   By: Keane Police D.O.   On: 06/06/2022 16:19   DG Chest Portable 1 View  Result Date: 06/06/2022 CLINICAL DATA:  Altered mental status EXAM: PORTABLE CHEST 1 VIEW COMPARISON:  Chest radiograph dated 01/13/2022 FINDINGS: Low lung volumes. Bibasilar patchy opacities, left-greater-than-right. No pleural effusion or pneumothorax. The heart size and mediastinal contours are within normal limits. The visualized skeletal structures are unremarkable. Right upper quadrant surgical clips. IMPRESSION: Low lung volumes with bibasilar patchy opacities, left-greater-than-right, likely atelectasis. Aspiration or pneumonia can be considered in the appropriate clinical setting. Electronically Signed   By: Darrin Nipper M.D.   On: 06/06/2022 15:33    Microbiology: Results for orders placed or performed during the hospital encounter of 02/12/20  Urine Culture     Status: Abnormal   Collection Time: 02/12/20  4:49 AM   Specimen: Urine, Clean Catch  Result Value Ref Range Status   Specimen Description   Final  URINE, CLEAN CATCH Performed at [facility], [address] [place], [place] [address]    Special Requests   Final    NONE Performed at [facility], [address] [place], [place] [address]    Culture (A)  Final    <10,000 COLONIES/mL INSIGNIFICANT GROWTH Performed at [facility] [address], [place], [place] [address]    Report Status 02/13/2020 FINAL  Final  Respiratory Panel by RT PCR (Flu A&B, Covid) - Nasopharyngeal Swab     Status: None   Collection Time: 02/12/20 12:33 PM   Specimen: Nasopharyngeal Swab  Result Value Ref Range Status   SARS  Coronavirus 2 by RT PCR NEGATIVE NEGATIVE Final    Comment: (NOTE) SARS-CoV-2 target nucleic acids are NOT DETECTED.  The SARS-CoV-2 RNA is generally detectable in upper respiratoy specimens during the acute phase of infection. The lowest concentration of SARS-CoV-2 viral copies this assay can detect is 131 copies/mL. A negative result does not preclude SARS-Cov-2 infection and should not be used as the sole basis for treatment or other patient management decisions. A negative result may occur with  improper specimen collection/handling, submission of specimen other than nasopharyngeal swab, presence of viral mutation(s) within the areas targeted by this assay, and inadequate number of viral copies (<131 copies/mL). A negative result must be combined with clinical observations, patient history, and epidemiological information. The expected result is Negative.  Fact Sheet for Patients:  PinkCheek.be  Fact Sheet for Healthcare Providers:  GravelBags.it  This test is no t yet approved or cleared by the [place] FDA and  has been authorized for detection and/or diagnosis of SARS-CoV-2 by FDA under an Emergency Use Authorization (EUA). This EUA will remain  in effect (meaning this test can be used) for the duration of the COVID-19 declaration under Section 564(b)(1) of the Act, 21 U.S.C. section 360bbb-3(b)(1), unless the authorization is terminated or revoked sooner.     Influenza A by PCR NEGATIVE NEGATIVE Final   Influenza B by PCR NEGATIVE NEGATIVE Final    Comment: (NOTE) The Xpert Xpress SARS-CoV-2/FLU/RSV assay is intended as an aid in  the diagnosis of influenza from Nasopharyngeal swab specimens and  should not be used as a sole basis for treatment. Nasal washings and  aspirates are unacceptable for Xpert Xpress SARS-CoV-2/FLU/RSV  testing.  Fact Sheet for  Patients: PinkCheek.be  Fact Sheet for Healthcare Providers: GravelBags.it  This test is not yet approved or cleared by the [place] FDA and  has been authorized for detection and/or diagnosis of SARS-CoV-2 by  FDA under an Emergency Use Authorization (EUA). This EUA will remain  in effect (meaning this test can be used) for the duration of the  Covid-19 declaration under Section 564(b)(1) of the Act, 21  U.S.C. section 360bbb-3(b)(1), unless the authorization is  terminated or revoked. Performed at [facility], [address], [place], [place] [address]   Surgical pcr screen     Status: None   Collection Time: 02/12/20  6:15 PM   Specimen: Nasal Mucosa; Nasal Swab  Result Value Ref Range Status   MRSA, PCR NEGATIVE NEGATIVE Final   Staphylococcus aureus NEGATIVE NEGATIVE Final    Comment: (NOTE) The Xpert SA Assay (FDA approved for NASAL specimens in patients 73 years of age and older), is one component of a comprehensive surveillance program. It is not intended to diagnose infection nor to guide or monitor treatment. Performed at [facility], [address] [place], [place] [address]  Labs: CBC: Recent Labs  Lab 06/06/22 1439 06/07/22 0417  WBC 11.6* 8.2  NEUTROABS 10.2*  --   HGB 14.7 11.9*  HCT 46.3* 39.6  MCV 89.6 91.2  PLT 227 102   Basic Metabolic Panel: Recent Labs  Lab 06/06/22 1439 06/07/22 0417 06/08/22 0414 06/09/22 0412  NA 142 141 139 139  K 3.6 3.2* 3.4* 3.8  CL 104 109 108 105  CO2 '26 26 24 25  '$ GLUCOSE 124* 86 85 91  BUN '13 12 9 8  '$ CREATININE 0.68 0.65 0.61 0.67  CALCIUM 11.2* 9.9 9.6 10.0  MG 1.9  --   --   --    Liver Function Tests: Recent Labs  Lab 06/06/22 1439  AST 23  ALT 16  ALKPHOS 99  BILITOT 0.7  PROT 8.6*  ALBUMIN 4.8   CBG: Recent Labs  Lab 06/06/22 1426  GLUCAP 69*    Discharge time spent: greater than 30 minutes.  Signed: Deatra James,  MD [facility] 06/09/2022

## 2022-06-12 ENCOUNTER — Telehealth: Payer: Self-pay

## 2022-06-12 ENCOUNTER — Other Ambulatory Visit (HOSPITAL_COMMUNITY): Payer: Self-pay

## 2022-06-12 NOTE — Telephone Encounter (Signed)
Transition Care Management Unsuccessful Follow-up Telephone Call  Date of discharge and from where:  [facility] 06/09/2022  Attempts:  1st Attempt  Reason for unsuccessful TCM follow-up call:  Left voice message Juanda Crumble, [place] Direct Dial [phone]

## 2022-06-13 ENCOUNTER — Other Ambulatory Visit (HOSPITAL_COMMUNITY): Payer: Self-pay

## 2022-06-13 NOTE — Telephone Encounter (Signed)
Transition Care Management Unsuccessful Follow-up Telephone Call  Date of discharge and from where:  [facility] 06/09/2022  Attempts:  2nd Attempt  Reason for unsuccessful TCM follow-up call:  Left voice messagewith mother Juanda Crumble, [place] Direct Dial [phone]

## 2022-06-15 NOTE — Telephone Encounter (Signed)
Transition Care Management Follow-up Telephone Call Date of discharge and from where: [facility] 06/09/2022 How have you been since you were released from the hospital? better Any questions or concerns? No  Items Reviewed: Did the pt receive and understand the discharge instructions provided? Yes  Medications obtained and verified? Yes  Other? No  Any new allergies since your discharge? No  Dietary orders reviewed? Yes Do you have support at home? Yes   Home Care and Equipment/Supplies: Were home health services ordered? no If so, what is the name of the agency? N/a  Has the agency set up a time to come to the patient's home? no Were any new equipment or medical supplies ordered?  No What is the name of the medical supply agency? N/a Were you able to get the supplies/equipment? no Do you have any questions related to the use of the equipment or supplies? No  Functional Questionnaire: (I = Independent and D = Dependent) ADLs: I  Bathing/Dressing- I  Meal Prep- I  Eating- I  Maintaining continence- I  Transferring/Ambulation- I  Managing Meds- I  Follow up appointments reviewed:  PCP Hospital f/u appt confirmed? No  no avail appt times. Sent message to staff to schedule Specialist Hospital f/u appt confirmed? No   Are transportation arrangements needed? No  If their condition worsens, is the pt aware to call PCP or go to the Emergency Dept.? Yes Was the patient provided with contact information for the PCP's office or ED? Yes Was to pt encouraged to call back with questions or concerns? Yes Juanda Crumble, LPN [place] Direct Dial [phone]

## 2022-06-22 ENCOUNTER — Ambulatory Visit (INDEPENDENT_AMBULATORY_CARE_PROVIDER_SITE_OTHER): Payer: Commercial Managed Care - PPO | Admitting: Internal Medicine

## 2022-06-22 ENCOUNTER — Encounter: Payer: Self-pay | Admitting: Internal Medicine

## 2022-06-22 ENCOUNTER — Other Ambulatory Visit (HOSPITAL_COMMUNITY): Payer: Self-pay

## 2022-06-22 VITALS — BP 124/82 | HR 86 | Ht 65.0 in | Wt 134.0 lb

## 2022-06-22 DIAGNOSIS — M349 Systemic sclerosis, unspecified: Secondary | ICD-10-CM | POA: Diagnosis not present

## 2022-06-22 DIAGNOSIS — K5904 Chronic idiopathic constipation: Secondary | ICD-10-CM

## 2022-06-22 DIAGNOSIS — Z09 Encounter for follow-up examination after completed treatment for conditions other than malignant neoplasm: Secondary | ICD-10-CM | POA: Diagnosis not present

## 2022-06-22 DIAGNOSIS — K56609 Unspecified intestinal obstruction, unspecified as to partial versus complete obstruction: Secondary | ICD-10-CM | POA: Diagnosis not present

## 2022-06-22 MED ORDER — LINACLOTIDE 72 MCG PO CAPS
72.0000 ug | ORAL_CAPSULE | Freq: Every day | ORAL | 2 refills | Status: DC
  Filled 2022-06-22: qty 30, 30d supply, fill #0

## 2022-06-22 NOTE — Patient Instructions (Addendum)
Please start taking Linzess as prescribed.  Please take at least 50 ounces of fluid in a day to avoid dehydration.  Please continue to follow low salt diet and ambulate as tolerated.

## 2022-06-23 NOTE — Assessment & Plan Note (Signed)
Likely as an effect of scleroderma SBO resolved now Has BM now, but has to strain and has hard pebbles of stool Needs to maintain adequate hydration Bowel sounds normal today

## 2022-06-23 NOTE — Progress Notes (Addendum)
Established Patient Office Visit  Subjective:  Patient ID: Karen [name], female    DOB: [birth year]  Age: 52 y.o. MRN: [id number]  CC:  Chief Complaint  Patient presents with   Hospitalization Follow-up    Hospital follow up from 06/06/2022-06/09/2022 for a small bowel obstruction    HPI Karen [name] is a 52 y.o. female with past medical history of HTN, scleroderma, ILD, pulmonary hypertension, dysphagia, hypothyroidism, chronic constipation and hot flashes who presents for recent hospitalization for SBO.  She was admitted with complaint of nausea and vomiting, and was found to have SBO.  She was given bowel regimen and IV Zofran.  She did not require NG tube placement.  She was seen by general surgery team in the hospital.  She was able to have BM while in the hospital with MiraLAX.  She currently denies nausea or vomiting.  She still has chronic constipation despite taking Colace and MiraLAX.  She has tried multiple OTC stool softeners and laxatives, including Colace, Dulcolax, Benefiber, Metamucil and MiraLAX.  Past Medical History:  Diagnosis Date   Acute respiratory failure with hypoxia due to flash pulmonary edema requiring intubation 02/13/2020   Calculus of gallbladder with acute cholecystitis without obstruction    CHF (congestive heart failure) (HCC)    GERD (gastroesophageal reflux disease)    Hypertension    Hypothyroidism    ILD (interstitial lung disease) (HCC) 02/08/2018   Interstitial lung disease (HCC)    Multinodular thyroid    benign FNA 08/2017.   Perimenopause 02/27/2019   Pulmonary edema    Scleroderma (HCC)    Status post laparoscopic cholecystectomy 02/13/20 02/13/2020   Vitamin D deficiency disease 02/27/2019    Past Surgical History:  Procedure Laterality Date   ABDOMINAL HERNIA REPAIR     BUNIONECTOMY Bilateral 10/2018   CHOLECYSTECTOMY N/A 02/13/2020   Procedure: LAPAROSCOPIC CHOLECYSTECTOMY;  Surgeon: Franky Macho, MD;  Location: [facility];  Service:  General;  Laterality: N/A;   COLONOSCOPY WITH PROPOFOL N/A 01/02/2019   Procedure: COLONOSCOPY WITH PROPOFOL;  Surgeon: Corbin Ade, MD;  Location: [facility];  Service: Endoscopy;  Laterality: N/A;  12:30pm   ESOPHAGOGASTRODUODENOSCOPY (EGD) WITH PROPOFOL N/A 01/28/2018   erosive reflux esophagitis, patulous EG Junction, no dilation, incomplete EGD due to retained food in stomach. GES thereafter with delayed gastric emptying.    RIGHT HEART CATH N/A 09/27/2017   Procedure: RIGHT HEART CATH;  Surgeon: Dolores Patty, MD;  Location: [facility] INVASIVE CV LAB;  Service: Cardiovascular;  Laterality: N/A;   RIGHT HEART CATH N/A 09/05/2019   Procedure: RIGHT HEART CATH;  Surgeon: Dolores Patty, MD;  Location: [facility] INVASIVE CV LAB;  Service: Cardiovascular;  Laterality: N/A;   RIGHT/LEFT HEART CATH AND CORONARY ANGIOGRAPHY N/A 08/18/2021   Procedure: RIGHT/LEFT HEART CATH AND CORONARY ANGIOGRAPHY;  Surgeon: Dolores Patty, MD;  Location: [facility] INVASIVE CV LAB;  Service: Cardiovascular;  Laterality: N/A;   UTERINE FIBROID SURGERY      Family History  Problem Relation Age of Onset   Hypertension Father    Hypertension Sister    Hypertension Brother    Diabetes Maternal Grandfather    Diabetes Maternal Uncle    Diabetes Mother    Colon cancer Neg Hx     Social History   Socioeconomic History   Marital status: Single    Spouse name: Not on file   Number of children: 0   Years of education: Not on file   Highest education level: Associate degree: occupational,  technical, or vocational program  Occupational History   Occupation: CMA    Employer: [employer]  Tobacco Use   Smoking status: Never   Smokeless tobacco: Never  Vaping Use   Vaping status: Former  Substance and Sexual Activity   Alcohol use: No   Drug use: No   Sexual activity: Yes  Other Topics Concern   Not on file  Social History Narrative   Patient is right-handed. She lives alone in one level home, a  few steps to enter.CMA [employer].   Social Determinants of Health   Financial Resource Strain: Not on file  Food Insecurity: No Food Insecurity (02/04/2023)   Hunger Vital Sign    Worried About Running Out of Food in the Last Year: Never true    Ran Out of Food in the Last Year: Never true  Transportation Needs: No Transportation Needs (02/04/2023)   PRAPARE - Administrator, Civil Service (Medical): No    Lack of Transportation (Non-Medical): No  Physical Activity: Not on file  Stress: Not on file  Social Connections: Not on file  Intimate Partner Violence: Not At Risk (02/04/2023)   Humiliation, Afraid, Rape, and Kick questionnaire    Fear of Current or Ex-Partner: No    Emotionally Abused: No    Physically Abused: No    Sexually Abused: No    Outpatient Medications Prior to Visit  Medication Sig Dispense Refill   acetaminophen (TYLENOL) 325 MG tablet Take 2 tablets (650 mg total) by mouth every 6 (six) hours as needed for mild pain, fever or headache (or Fever >/= 101). 12 tablet 0   macitentan (OPSUMIT) 10 MG tablet Take 1 tablet (10 mg total) by mouth daily. 30 tablet 11   mycophenolate (CELLCEPT) 500 MG tablet Take 3 tablets (1,500 mg total) by mouth every morning AND 3 tablets (1,500 mg total) every evening. 540 tablet 1   amLODipine (NORVASC) 10 MG tablet Take 1 tablet (10 mg total) by mouth daily. 30 tablet 6   benzonatate (TESSALON) 100 MG capsule Take 1 capsule (100 mg total) by mouth 2 (two) times daily as needed for cough. 30 capsule 1   bisoprolol (ZEBETA) 5 MG tablet Take 1 tablet (5 mg total) by mouth daily. 90 tablet 3   clindamycin-benzoyl peroxide (BENZACLIN) gel Apply 1 Application topically as needed. (Patient not taking: Reported on 10/11/2022)     clobetasol cream (TEMOVATE) 0.05 % Apply 1 Application topically daily as needed (For rash). (Patient not taking: Reported on 02/05/2023)     docusate sodium (COLACE) 100 MG capsule Take 2 capsules  (200 mg total) by mouth daily. 10 capsule 0   esomeprazole (NEXIUM) 40 MG capsule Take 1 capsule (40 mg total) by mouth 2 (two) times daily. 180 capsule 1   furosemide (LASIX) 40 MG tablet Take 1 tablet (40 mg total) by mouth daily as needed for edema. 30 tablet 6   Multiple Vitamins-Minerals (HM MULTIVITAMIN ADULT GUMMY PO) Take 2 tablets by mouth daily. (Patient not taking: Reported on 10/11/2022)     Nintedanib (OFEV) 150 MG CAPS Take 1 capsule (150 mg total) by mouth 2 (two) times daily. 60 capsule 4   NP THYROID 120 MG tablet Take 1 tablet (120 mg total) by mouth daily before breakfast. 90 tablet 0   potassium chloride 20 MEQ/15ML (10%) SOLN Take 20 mEq by mouth as needed.     spironolactone (ALDACTONE) 25 MG tablet Take 1 tablet (25 mg total) by mouth  2 (two) times daily. 60 tablet 6   tacrolimus (PROTOPIC) 0.1 % ointment Apply to dry skin on face and body daily to twice daily as needed. 60 g 5   triamcinolone cream (KENALOG) 0.1 % Apply 1 Application topically daily as needed (For rash). (Patient not taking: Reported on 02/05/2023)     No facility-administered medications prior to visit.    Allergies  Allergen Reactions   Lisinopril Hives, Swelling and Other (See Comments)   Tocilizumab Other (See Comments)    Swelling and shortness of breath sharpe pains in back and chest tightness.    ROS Review of Systems  Constitutional:  Positive for fatigue. Negative for chills and fever.  HENT:  Negative for congestion, sinus pressure, sinus pain and sore throat.   Eyes:  Negative for pain and discharge.  Respiratory:  Negative for cough and shortness of breath.   Cardiovascular:  Negative for chest pain and palpitations.  Gastrointestinal:  Positive for constipation. Negative for abdominal pain, nausea and vomiting.  Endocrine: Negative for polydipsia and polyuria.  Genitourinary:  Negative for dysuria and hematuria.  Musculoskeletal:  Positive for arthralgias. Negative for neck pain and  neck stiffness.  Skin:  Negative for rash.  Neurological:  Negative for dizziness and weakness.  Psychiatric/Behavioral:  Negative for agitation and behavioral problems.       Objective:    Physical Exam Vitals reviewed.  Constitutional:      General: She is not in acute distress.    Appearance: She is not diaphoretic.  HENT:     Head: Normocephalic and atraumatic.     Nose: No congestion.     Mouth/Throat:     Mouth: Mucous membranes are moist.  Eyes:     General: No scleral icterus.    Extraocular Movements: Extraocular movements intact.  Cardiovascular:     Rate and Rhythm: Normal rate and regular rhythm.     Heart sounds: Normal heart sounds. No murmur heard. Pulmonary:     Breath sounds: Normal breath sounds. No wheezing or rales.  Abdominal:     General: Bowel sounds are normal.     Palpations: Abdomen is soft.     Tenderness: There is no abdominal tenderness.  Musculoskeletal:     Cervical back: Neck supple. No tenderness.     Right lower leg: No edema.     Left lower leg: No edema.  Skin:    General: Skin is warm.     Findings: No rash.  Neurological:     General: No focal deficit present.     Mental Status: She is alert and oriented to person, place, and time.     Sensory: No sensory deficit.     Motor: No weakness.  Psychiatric:        Mood and Affect: Mood normal.        Behavior: Behavior normal.     BP 124/82 (BP Location: Right Arm, Cuff Size: Normal)   Pulse 86   Ht 5\' 5"  (1.651 m)   Wt 134 lb (60.8 kg)   SpO2 91%   BMI 22.30 kg/m  Wt Readings from Last 3 Encounters:  02/22/23 140 lb 6.4 oz (63.7 kg)  02/01/23 139 lb 3.2 oz (63.1 kg)  12/21/22 139 lb 6.4 oz (63.2 kg)    Lab Results  Component Value Date   TSH 0.986 02/04/2023   Lab Results  Component Value Date   WBC 6.0 02/07/2023   HGB 12.1 02/07/2023   HCT 39.2 02/07/2023  MCV 86.5 02/07/2023   PLT 221 02/07/2023   Lab Results  Component Value Date   NA 139 02/07/2023   K  3.3 (L) 02/07/2023   CO2 22 02/07/2023   GLUCOSE 99 02/07/2023   BUN <5 (L) 02/07/2023   CREATININE 0.51 02/07/2023   BILITOT 0.5 02/04/2023   ALKPHOS 90 02/04/2023   AST 19 02/04/2023   ALT 14 02/04/2023   PROT 7.3 02/04/2023   ALBUMIN 3.9 02/04/2023   CALCIUM 9.5 02/07/2023   ANIONGAP 9 02/07/2023   EGFR 107 12/28/2022   GFR 87.51 11/20/2019   Lab Results  Component Value Date   CHOL 196 04/13/2022   Lab Results  Component Value Date   HDL 54 04/13/2022   Lab Results  Component Value Date   LDLCALC 125 (H) 04/13/2022   Lab Results  Component Value Date   TRIG 93 04/13/2022   Lab Results  Component Value Date   CHOLHDL 3.6 04/13/2022   Lab Results  Component Value Date   HGBA1C 5.9 (H) 12/28/2022      Assessment & Plan:   Problem List Items Addressed This Visit       Digestive   Chronic idiopathic constipation    Has tried multiple OTC stool softeners and laxatives - including Colace, Dulcolax, Benefiber, Metamucil and MiraLAX Started Linzess      SBO (small bowel obstruction) (HCC) - Primary    Likely as an effect of scleroderma SBO resolved now Has BM now, but has to strain and has hard pebbles of stool Needs to maintain adequate hydration Bowel sounds normal today        Other   Scleroderma (HCC)    Followed by Dr. Nickola Major Has multiple complications - ILD, pulmonary HTN, esophageal dysphagia and recently SBO      Hospital discharge follow-up    Hospital chart reviewed including discharge summary SBO resolved now Still has chronic constipation, likely due to scleroderma Medications reconciled and reviewed with the patient in detail       Meds ordered this encounter  Medications   DISCONTD: linaclotide (LINZESS) 72 MCG capsule    Sig: Take 1 capsule (72 mcg total) by mouth daily before breakfast.    Dispense:  30 capsule    Refill:  2    Follow-up: Return if symptoms worsen or fail to improve.    Anabel Halon, MD

## 2022-06-23 NOTE — Assessment & Plan Note (Addendum)
Hospital chart reviewed including discharge summary SBO resolved now Still has chronic constipation, likely due to scleroderma Medications reconciled and reviewed with the patient in detail

## 2022-06-23 NOTE — Assessment & Plan Note (Signed)
Followed by Dr. Trudie Reed Has multiple complications - ILD, pulmonary HTN, esophageal dysphagia and recently SBO

## 2022-06-23 NOTE — Assessment & Plan Note (Signed)
Has tried multiple OTC stool softeners and laxatives - including Colace, Dulcolax, Benefiber, Metamucil and MiraLAX Started Linzess

## 2022-06-30 ENCOUNTER — Ambulatory Visit (HOSPITAL_COMMUNITY): Payer: Commercial Managed Care - PPO

## 2022-07-03 ENCOUNTER — Other Ambulatory Visit (HOSPITAL_COMMUNITY): Payer: Self-pay

## 2022-07-04 ENCOUNTER — Encounter (HOSPITAL_COMMUNITY): Payer: Self-pay | Admitting: Pharmacy Technician

## 2022-07-04 ENCOUNTER — Other Ambulatory Visit (HOSPITAL_COMMUNITY): Payer: Self-pay

## 2022-07-04 ENCOUNTER — Telehealth (HOSPITAL_COMMUNITY): Payer: Self-pay | Admitting: Pharmacy Technician

## 2022-07-04 NOTE — Telephone Encounter (Signed)
Advanced Heart Failure Patient Advocate Encounter  Patient left message that she ran out of Opsumit on Friday. Called [facility] to see what the status of her refill was. She has a $200 co-pay left over from insurance. Asked if they had co-pay card information on file. They do not. Was able to get past information from 2021. Sent patient mychart message with prior co-pay card information. I do not think that it is still active now. Advised the patient to call [facility]. If that co-pay card does not work, they will need to advise her on how to obtain another one. This is not something that the specialty pharmacy will allow [place] to do on the patient's behalf.  Charlann Boxer, CPhT

## 2022-07-10 ENCOUNTER — Other Ambulatory Visit (HOSPITAL_COMMUNITY): Payer: Self-pay

## 2022-07-13 DIAGNOSIS — M778 Other enthesopathies, not elsewhere classified: Secondary | ICD-10-CM | POA: Diagnosis not present

## 2022-07-13 DIAGNOSIS — M79641 Pain in right hand: Secondary | ICD-10-CM | POA: Diagnosis not present

## 2022-07-17 ENCOUNTER — Other Ambulatory Visit (HOSPITAL_COMMUNITY): Payer: Self-pay

## 2022-07-17 DIAGNOSIS — Z6822 Body mass index (BMI) 22.0-22.9, adult: Secondary | ICD-10-CM | POA: Diagnosis not present

## 2022-07-17 DIAGNOSIS — M79642 Pain in left hand: Secondary | ICD-10-CM | POA: Diagnosis not present

## 2022-07-17 DIAGNOSIS — I272 Pulmonary hypertension, unspecified: Secondary | ICD-10-CM | POA: Diagnosis not present

## 2022-07-17 DIAGNOSIS — M779 Enthesopathy, unspecified: Secondary | ICD-10-CM | POA: Diagnosis not present

## 2022-07-17 DIAGNOSIS — M349 Systemic sclerosis, unspecified: Secondary | ICD-10-CM | POA: Diagnosis not present

## 2022-07-17 DIAGNOSIS — I7300 Raynaud's syndrome without gangrene: Secondary | ICD-10-CM | POA: Diagnosis not present

## 2022-07-17 DIAGNOSIS — J849 Interstitial pulmonary disease, unspecified: Secondary | ICD-10-CM | POA: Diagnosis not present

## 2022-07-17 DIAGNOSIS — M79641 Pain in right hand: Secondary | ICD-10-CM | POA: Diagnosis not present

## 2022-07-17 MED ORDER — MELOXICAM 15 MG PO TABS
15.0000 mg | ORAL_TABLET | Freq: Every day | ORAL | 1 refills | Status: DC | PRN
  Filled 2022-07-17: qty 30, 30d supply, fill #0

## 2022-07-27 ENCOUNTER — Ambulatory Visit (HOSPITAL_COMMUNITY)
Admission: RE | Admit: 2022-07-27 | Discharge: 2022-07-27 | Disposition: A | Discharge status: Home / Self Care | Payer: Commercial Managed Care - PPO | Source: Ambulatory Visit | Attending: Pulmonary Disease | Admitting: Pulmonary Disease

## 2022-07-27 DIAGNOSIS — L668 Other cicatricial alopecia: Secondary | ICD-10-CM | POA: Diagnosis not present

## 2022-07-27 DIAGNOSIS — J849 Interstitial pulmonary disease, unspecified: Secondary | ICD-10-CM

## 2022-07-27 DIAGNOSIS — D239 Other benign neoplasm of skin, unspecified: Secondary | ICD-10-CM | POA: Diagnosis not present

## 2022-07-27 DIAGNOSIS — R918 Other nonspecific abnormal finding of lung field: Secondary | ICD-10-CM | POA: Diagnosis not present

## 2022-07-27 DIAGNOSIS — L658 Other specified nonscarring hair loss: Secondary | ICD-10-CM | POA: Diagnosis not present

## 2022-07-27 DIAGNOSIS — L700 Acne vulgaris: Secondary | ICD-10-CM | POA: Diagnosis not present

## 2022-07-27 DIAGNOSIS — J479 Bronchiectasis, uncomplicated: Secondary | ICD-10-CM | POA: Diagnosis not present

## 2022-07-28 ENCOUNTER — Other Ambulatory Visit (HOSPITAL_COMMUNITY): Payer: Self-pay

## 2022-07-28 MED ORDER — FLUOCINOLONE ACETONIDE SCALP 0.01 % EX OIL
TOPICAL_OIL | CUTANEOUS | 7 refills | Status: DC
  Filled 2022-07-28: qty 118.28, 30d supply, fill #0

## 2022-07-31 ENCOUNTER — Encounter: Payer: Self-pay | Admitting: Pulmonary Disease

## 2022-07-31 ENCOUNTER — Other Ambulatory Visit (HOSPITAL_COMMUNITY): Payer: Self-pay

## 2022-08-03 NOTE — Telephone Encounter (Signed)
I already sent a result note that her CT scan shows stable interstitial lung disease and scarring which is good news.  There are no worrisome new findings.  Advised her to continue current therapy  If she desires we can make an inperson visit with me to review results

## 2022-08-09 ENCOUNTER — Telehealth (HOSPITAL_COMMUNITY): Payer: Self-pay | Admitting: Pharmacy Technician

## 2022-08-09 NOTE — Telephone Encounter (Signed)
Advanced Heart Failure Patient Advocate Encounter  Received notice from [facility] that patient has been unreachable to schedule shipment of Opsumit.   Have sent patient several messages regarding obtaining a co-pay card with help from [facility] via mychart. They will not allow the office to help with co-pay cards. Patient is aware what next steps are. Hopefully she will be able to obtain the medication soon.   Charlann Boxer, CPhT

## 2022-08-17 ENCOUNTER — Other Ambulatory Visit (HOSPITAL_COMMUNITY): Payer: Self-pay | Admitting: Internal Medicine

## 2022-08-17 ENCOUNTER — Ambulatory Visit: Payer: Commercial Managed Care - PPO | Admitting: Internal Medicine

## 2022-08-17 ENCOUNTER — Other Ambulatory Visit: Payer: Self-pay | Admitting: Internal Medicine

## 2022-08-17 ENCOUNTER — Other Ambulatory Visit (HOSPITAL_COMMUNITY): Payer: Self-pay

## 2022-08-17 ENCOUNTER — Encounter: Payer: Self-pay | Admitting: Internal Medicine

## 2022-08-17 VITALS — BP 144/96 | HR 97 | Ht 65.0 in | Wt 138.8 lb

## 2022-08-17 DIAGNOSIS — J849 Interstitial pulmonary disease, unspecified: Secondary | ICD-10-CM

## 2022-08-17 DIAGNOSIS — E039 Hypothyroidism, unspecified: Secondary | ICD-10-CM

## 2022-08-17 DIAGNOSIS — K219 Gastro-esophageal reflux disease without esophagitis: Secondary | ICD-10-CM

## 2022-08-17 DIAGNOSIS — I272 Pulmonary hypertension, unspecified: Secondary | ICD-10-CM | POA: Diagnosis not present

## 2022-08-17 DIAGNOSIS — E876 Hypokalemia: Secondary | ICD-10-CM

## 2022-08-17 DIAGNOSIS — I10 Essential (primary) hypertension: Secondary | ICD-10-CM | POA: Diagnosis not present

## 2022-08-17 MED ORDER — AMLODIPINE BESYLATE 10 MG PO TABS
10.0000 mg | ORAL_TABLET | Freq: Every day | ORAL | 3 refills | Status: DC
  Filled 2022-08-17: qty 90, 90d supply, fill #0

## 2022-08-17 MED ORDER — POTASSIUM CHLORIDE 20 MEQ/15ML (10%) PO SOLN
20.0000 meq | Freq: Every day | ORAL | 3 refills | Status: DC
  Filled 2022-08-17: qty 450, 30d supply, fill #0
  Filled 2023-02-28: qty 450, 30d supply, fill #1

## 2022-08-17 MED ORDER — SPIRONOLACTONE 25 MG PO TABS
25.0000 mg | ORAL_TABLET | Freq: Two times a day (BID) | ORAL | 0 refills | Status: DC
  Filled 2022-08-17: qty 60, 30d supply, fill #0

## 2022-08-17 NOTE — Assessment & Plan Note (Signed)
Lab Results  Component Value Date   TSH 0.898 06/06/2022   On NP thyroid 120 mg QD -needs to take it in a.m. on empty stomach Check TSH and free T4

## 2022-08-17 NOTE — Assessment & Plan Note (Signed)
Followed by Dr. Isaiah Serge Was on Ofev, needs to contact Pulmonology for Ofev Has had flash pulmonary edema and pulmonary hypertension from it, followed by CHF clinic - but currently appears euvolemic  Chronic cough, likely due to ILD Tessalon as needed for cough

## 2022-08-17 NOTE — Assessment & Plan Note (Addendum)
BP Readings from Last 1 Encounters:  08/17/22 (!) 144/98   Elevated today as she has run out of amlodipine Was well-controlled with amlodipine, Zebeta and spironolactone -  refilled amlodipine and spironolactone Gets Zebeta and spironolactone from CHF clinic Counseled for compliance with the medications Advised DASH diet and moderate exercise/walking, at least 150 mins/week

## 2022-08-17 NOTE — Assessment & Plan Note (Addendum)
Takes potassium supplement with Lasix Refilled potassium chloride solution

## 2022-08-17 NOTE — Patient Instructions (Signed)
Please start taking Amlodipine 10 mg as prescribed.  Please continue to take medications as prescribed.  Please continue to follow low carb diet and perform moderate exercise/walking at least 150 mins/week.  Please get fasting blood tests done before the next visit.

## 2022-08-17 NOTE — Assessment & Plan Note (Signed)
On Nexium for GERD, cough could be due to GERD as well Avoid hot and spicy food

## 2022-08-17 NOTE — Assessment & Plan Note (Signed)
Likely from ILD due to scleroderma Followed by CHF clinic Appears euvolemic currently, on spironolactone and as needed Lasix currently On Opsumit, needs to contact CHF clinic as she has co-pay concern

## 2022-08-17 NOTE — Progress Notes (Signed)
Established Patient Office Visit  Subjective:  Patient ID: Karen [name], female    DOB: 10-09-1970  Age: 52 y.o. MRN: [id number]  CC:  Chief Complaint  Patient presents with   Hypertension    Follow up    HPI Karen [name] is a 52 y.o. female with past medical history of HTN, scleroderma, ILD, pulmonary hypertension, dysphagia, hypothyroidism, chronic constipation and hot flashes who presents for f/u of her chronic medical conditions.  She has history of hypothyroidism, for which she takes NP thyroid. She currently denies any tremors, palpitations, or recent change in weight or appetite.   Essential hypertension and pulmonary hypertension: Her blood blood pressure is elevated today as she has run out of amlodipine.  Her blood pressure was well controlled with amlodipine, Zebeta and spironolactone.  She has history of pulmonary hypertension from ILD, for which she takes spironolactone and Lasix currently. She has run out of Opsumit as she can not afford it. She follows up with CHF clinic.  She currently denies any dyspnea or recent worsening of leg swelling.  She reports chronic cough, but denies any fever, chills, or recent worsening of dyspnea.  She has chronic nasal congestion and reports history of environmental allergies.  She has tried multiple antihistaminics including Zyrtec, Xyzal, etc.  She has history of scleroderma, for which she sees Dr. Nickola Major. She is on Cellcept now.  She sees Dr. 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  She was on Ofev for it, but does not afford it now.   Past Medical History:  Diagnosis Date   Acute respiratory failure with hypoxia due to flash pulmonary edema requiring intubation 02/13/2020   Calculus of gallbladder with acute cholecystitis without obstruction    CHF (congestive heart failure)    GERD (gastroesophageal reflux disease)    Hypertension    Hypothyroidism    ILD (interstitial lung disease) 02/08/2018   Interstitial lung disease     Multinodular thyroid    benign FNA 08/2017.   Perimenopause 02/27/2019   Pulmonary edema    Scleroderma    Status post laparoscopic cholecystectomy 02/13/20 02/13/2020   Vitamin D deficiency disease 02/27/2019    Past Surgical History:  Procedure Laterality Date   ABDOMINAL HERNIA REPAIR     BUNIONECTOMY Bilateral 10/2018   CHOLECYSTECTOMY N/A 02/13/2020   Procedure: LAPAROSCOPIC CHOLECYSTECTOMY;  Surgeon: Franky Macho, MD;  Location: [facility];  Service: General;  Laterality: N/A;   COLONOSCOPY WITH PROPOFOL N/A 01/02/2019   Procedure: COLONOSCOPY WITH PROPOFOL;  Surgeon: Corbin Ade, MD;  Location: [facility];  Service: Endoscopy;  Laterality: N/A;  12:30pm   ESOPHAGOGASTRODUODENOSCOPY (EGD) WITH PROPOFOL N/A 01/28/2018   erosive reflux esophagitis, patulous EG Junction, no dilation, incomplete EGD due to retained food in stomach. GES thereafter with delayed gastric emptying.    RIGHT HEART CATH N/A 09/27/2017   Procedure: RIGHT HEART CATH;  Surgeon: Dolores Patty, MD;  Location: [facility] INVASIVE CV LAB;  Service: Cardiovascular;  Laterality: N/A;   RIGHT HEART CATH N/A 09/05/2019   Procedure: RIGHT HEART CATH;  Surgeon: Dolores Patty, MD;  Location: [facility] INVASIVE CV LAB;  Service: Cardiovascular;  Laterality: N/A;   RIGHT/LEFT HEART CATH AND CORONARY ANGIOGRAPHY N/A 08/18/2021   Procedure: RIGHT/LEFT HEART CATH AND CORONARY ANGIOGRAPHY;  Surgeon: Dolores Patty, MD;  Location: [facility] INVASIVE CV LAB;  Service: Cardiovascular;  Laterality: N/A;   UTERINE FIBROID SURGERY      Family History  Problem Relation Age of Onset  Hypertension Father    Hypertension Sister    Hypertension Brother    Diabetes Maternal Grandfather    Diabetes Maternal Uncle    Diabetes Mother    Colon cancer Neg Hx     Social History   Socioeconomic History   Marital status: Single    Spouse name: Not on file   Number of children: 0   Years of education: Not on file   Highest education  level: Associate degree: occupational, Scientist, product/process developmenttechnical, or vocational program  Occupational History   Occupation: CMA    Employer: [employer]  Tobacco Use   Smoking status: Never   Smokeless tobacco: Never  Vaping Use   Vaping Use: Former  Substance and Sexual Activity   Alcohol use: No   Drug use: No   Sexual activity: Yes  Other Topics Concern   Not on file  Social History Narrative   Patient is right-handed. She lives alone in one level home, a few steps to enter.CMA [employer].   Social Determinants of Health   Financial Resource Strain: Not on file  Food Insecurity: No Food Insecurity (06/06/2022)   Hunger Vital Sign    Worried About Running Out of Food in the Last Year: Never true    Ran Out of Food in the Last Year: Never true  Transportation Needs: No Transportation Needs (06/06/2022)   PRAPARE - Administrator, Civil ServiceTransportation    Lack of Transportation (Medical): No    Lack of Transportation (Non-Medical): No  Physical Activity: Not on file  Stress: Not on file  Social Connections: Not on file  Intimate Partner Violence: Not At Risk (06/06/2022)   Humiliation, Afraid, Rape, and Kick questionnaire    Fear of Current or Ex-Partner: No    Emotionally Abused: No    Physically Abused: No    Sexually Abused: No    Outpatient Medications Prior to Visit  Medication Sig Dispense Refill   acetaminophen (TYLENOL) 325 MG tablet Take 2 tablets (650 mg total) by mouth every 6 (six) hours as needed for mild pain, fever or headache (or Fever >/= 101). 12 tablet 0   benzonatate (TESSALON) 100 MG capsule Take 1 capsule (100 mg total) by mouth 2 (two) times daily as needed for cough. 30 capsule 1   bisoprolol (ZEBETA) 5 MG tablet Take 1 tablet (5 mg total) by mouth daily. 90 tablet 3   clindamycin-benzoyl peroxide (BENZACLIN) gel Apply 1 Application topically as needed.     clobetasol cream (TEMOVATE) 0.05 % Apply 1 Application topically as needed.     docusate sodium (COLACE) 100 MG  capsule Take 2 capsules (200 mg total) by mouth daily. 10 capsule 0   esomeprazole (NEXIUM) 40 MG capsule Take 1 capsule (40 mg total) by mouth 2 (two) times daily. 180 capsule 1   Fluocinolone Acetonide Scalp 0.01 % OIL Apply to affected frontal scalp 4 (four) times a week. Not to face 118.28 mL 7   furosemide (LASIX) 40 MG tablet Take 1 tablet (40 mg total) by mouth daily as needed for edema. 30 tablet 6   macitentan (OPSUMIT) 10 MG tablet Take 1 tablet (10 mg total) by mouth daily. 30 tablet 11   meloxicam (MOBIC) 15 MG tablet Take 1 tablet (15 mg total) by mouth daily as needed. 30 tablet 1   Multiple Vitamins-Minerals (HM MULTIVITAMIN ADULT GUMMY PO) Take 2 tablets by mouth daily.     mycophenolate (CELLCEPT) 500 MG tablet Take 3 tablets (1,500 mg total) by mouth every morning  AND 3 tablets (1,500 mg total) every evening. 540 tablet 1   Nintedanib (OFEV) 150 MG CAPS Take 1 capsule (150 mg total) by mouth 2 (two) times daily. 60 capsule 4   NP THYROID 120 MG tablet Take 1 tablet (120 mg total) by mouth daily before breakfast. 90 tablet 0   tacrolimus (PROTOPIC) 0.1 % ointment Apply to dry skin on face and body daily to twice daily as needed. 60 g 5   triamcinolone cream (KENALOG) 0.1 % Apply 1 Application topically daily as needed.     amLODipine (NORVASC) 10 MG tablet Take 1 tablet (10 mg total) by mouth daily. 30 tablet 6   potassium chloride 20 MEQ/15ML (10%) SOLN Take 20 mEq by mouth as needed.     spironolactone (ALDACTONE) 25 MG tablet Take 1 tablet (25 mg total) by mouth 2 (two) times daily. 60 tablet 6   linaclotide (LINZESS) 72 MCG capsule Take 1 capsule (72 mcg total) by mouth daily before breakfast. (Patient not taking: Reported on 08/17/2022) 30 capsule 2   No facility-administered medications prior to visit.    Allergies  Allergen Reactions   Lisinopril Hives, Swelling and Other (See Comments)   Tocilizumab Other (See Comments)    Swelling and shortness of breath sharpe pains  in back and chest tightness.    ROS Review of Systems  Constitutional:  Positive for fatigue. Negative for chills and fever.  HENT:  Negative for congestion, sinus pressure, sinus pain and sore throat.   Eyes:  Negative for pain and discharge.  Respiratory:  Positive for shortness of breath (intermittent). Negative for cough.   Cardiovascular:  Negative for chest pain and palpitations.  Gastrointestinal:  Positive for constipation. Negative for abdominal pain, nausea and vomiting.  Endocrine: Negative for polydipsia and polyuria.  Genitourinary:  Negative for dysuria and hematuria.  Musculoskeletal:  Positive for arthralgias. Negative for neck pain and neck stiffness.  Skin:  Negative for rash.  Neurological:  Negative for dizziness and weakness.  Psychiatric/Behavioral:  Negative for agitation and behavioral problems.       Objective:    Physical Exam Vitals reviewed.  Constitutional:      General: She is not in acute distress.    Appearance: She is not diaphoretic.  HENT:     Head: Normocephalic and atraumatic.     Nose: No congestion.     Mouth/Throat:     Mouth: Mucous membranes are moist.  Eyes:     General: No scleral icterus.    Extraocular Movements: Extraocular movements intact.  Cardiovascular:     Rate and Rhythm: Normal rate and regular rhythm.     Pulses: Normal pulses.     Heart sounds: Normal heart sounds. No murmur heard. Pulmonary:     Breath sounds: Normal breath sounds. No wheezing or rales.  Musculoskeletal:     Cervical back: Neck supple. No tenderness.     Right lower leg: No edema.     Left lower leg: No edema.  Skin:    General: Skin is warm.     Findings: No rash.  Neurological:     General: No focal deficit present.     Mental Status: She is alert and oriented to person, place, and time.     Sensory: No sensory deficit.     Motor: No weakness.  Psychiatric:        Mood and Affect: Mood normal.        Behavior: Behavior normal.  BP (!) 144/96 (BP Location: Left Arm)   Pulse 97   Ht 5\' 5"  (1.651 m)   Wt 138 lb 12.8 oz (63 kg)   SpO2 97%   BMI 23.10 kg/m  Wt Readings from Last 3 Encounters:  08/17/22 138 lb 12.8 oz (63 kg)  06/22/22 134 lb (60.8 kg)  06/06/22 136 lb 11 oz (62 kg)    Lab Results  Component Value Date   TSH 0.898 06/06/2022   Lab Results  Component Value Date   WBC 8.2 06/07/2022   HGB 11.9 (L) 06/07/2022   HCT 39.6 06/07/2022   MCV 91.2 06/07/2022   PLT 183 06/07/2022   Lab Results  Component Value Date   NA 139 06/09/2022   K 3.8 06/09/2022   CO2 25 06/09/2022   GLUCOSE 91 06/09/2022   BUN 8 06/09/2022   CREATININE 0.67 06/09/2022   BILITOT 0.7 06/06/2022   ALKPHOS 99 06/06/2022   AST 23 06/06/2022   ALT 16 06/06/2022   PROT 8.6 (H) 06/06/2022   ALBUMIN 4.8 06/06/2022   CALCIUM 10.0 06/09/2022   ANIONGAP 9 06/09/2022   EGFR 106 04/13/2022   GFR 87.51 11/20/2019   Lab Results  Component Value Date   CHOL 196 04/13/2022   Lab Results  Component Value Date   HDL 54 04/13/2022   Lab Results  Component Value Date   LDLCALC 125 (H) 04/13/2022   Lab Results  Component Value Date   TRIG 93 04/13/2022   Lab Results  Component Value Date   CHOLHDL 3.6 04/13/2022   Lab Results  Component Value Date   HGBA1C 5.7 (H) 04/13/2022      Assessment & Plan:   Problem List Items Addressed This Visit       Cardiovascular and Mediastinum   HTN (hypertension) - Primary    BP Readings from Last 1 Encounters:  08/17/22 (!) 144/98  Elevated today as she has run out of amlodipine Was well-controlled with amlodipine, Zebeta and spironolactone -  refilled amlodipine and spironolactone Gets Zebeta and spironolactone from CHF clinic Counseled for compliance with the medications Advised DASH diet and moderate exercise/walking, at least 150 mins/week      Relevant Medications   amLODipine (NORVASC) 10 MG tablet   spironolactone (ALDACTONE) 25 MG tablet    Pulmonary hypertension    Likely from ILD due to scleroderma Followed by CHF clinic Appears euvolemic currently, on spironolactone and as needed Lasix currently On Opsumit, needs to contact CHF clinic as she has co-pay concern      Relevant Medications   amLODipine (NORVASC) 10 MG tablet   spironolactone (ALDACTONE) 25 MG tablet     Respiratory   ILD (interstitial lung disease)    Followed by Dr. Isaiah Serge Was on Ofev, needs to contact Pulmonology for Ofev Has had flash pulmonary edema and pulmonary hypertension from it, followed by CHF clinic - but currently appears euvolemic  Chronic cough, likely due to ILD Tessalon as needed for cough        Digestive   GERD (gastroesophageal reflux disease)    On Nexium for GERD, cough could be due to GERD as well Avoid hot and spicy food        Endocrine   Hypothyroidism, adult    Lab Results  Component Value Date   TSH 0.898 06/06/2022  On NP thyroid 120 mg QD -needs to take it in a.m. on empty stomach Check TSH and free T4      Relevant Orders  TSH + free T4     Other   Hypokalemia    Takes potassium supplement with Lasix Refilled potassium chloride solution      Relevant Medications   potassium chloride 20 MEQ/15ML (10%) SOLN   Meds ordered this encounter  Medications   amLODipine (NORVASC) 10 MG tablet    Sig: Take 1 tablet (10 mg total) by mouth daily.    Dispense:  90 tablet    Refill:  3   potassium chloride 20 MEQ/15ML (10%) SOLN    Sig: Take 15 mLs (20 mEq total) by mouth daily with furosemide    Dispense:  450 mL    Refill:  3   spironolactone (ALDACTONE) 25 MG tablet    Sig: Take 1 tablet (25 mg total) by mouth 2 (two) times daily.    Dispense:  60 tablet    Refill:  0    Follow-up: Return in about 4 months (around 12/17/2022).    Anabel Halon, MD

## 2022-08-18 ENCOUNTER — Other Ambulatory Visit (HOSPITAL_COMMUNITY): Payer: Self-pay

## 2022-08-18 ENCOUNTER — Telehealth: Payer: Self-pay | Admitting: Pharmacist

## 2022-08-18 DIAGNOSIS — J849 Interstitial pulmonary disease, unspecified: Secondary | ICD-10-CM

## 2022-08-18 MED ORDER — NP THYROID 120 MG PO TABS
120.0000 mg | ORAL_TABLET | Freq: Every day | ORAL | 0 refills | Status: DC
  Filled 2022-08-18: qty 90, 90d supply, fill #0

## 2022-08-18 MED ORDER — FUROSEMIDE 40 MG PO TABS
40.0000 mg | ORAL_TABLET | Freq: Every day | ORAL | 6 refills | Status: DC | PRN
  Filled 2022-08-18: qty 30, 30d supply, fill #0

## 2022-08-18 NOTE — Telephone Encounter (Signed)
Patient reached out via MyChart stating she has not been able to fill Ofev (per dispense history, last filled in 2022). She has [employer] and should now be able to fill with [employer].  Submitted a Prior Authorization request to [facility] for OFEV via CoverMyMeds. Will update once we receive a response.  Key: [id number]   Chesley Mires, PharmD, MPH, BCPS, CPP Clinical Pharmacist (Rheumatology and Pulmonology)

## 2022-08-21 ENCOUNTER — Other Ambulatory Visit (HOSPITAL_COMMUNITY): Payer: Self-pay

## 2022-08-21 NOTE — Telephone Encounter (Signed)
Received notification from [facility] regarding a prior authorization for OFEV. Authorization has been APPROVED from 08/18/22 to 08/18/23. Approval letter sent to scan center.  Per test claim, copay for 30 days supply is $885  Patient must fill through [facility]: [phone]   Authorization # [id number]  MyChart message sent to patient regarding Ofev copay card information that is needed. Will await response from pt.  Chesley Mires, PharmD, MPH, BCPS, CPP Clinical Pharmacist (Rheumatology and Pulmonology)

## 2022-08-24 ENCOUNTER — Other Ambulatory Visit (HOSPITAL_COMMUNITY): Payer: Self-pay

## 2022-08-24 ENCOUNTER — Ambulatory Visit (HOSPITAL_COMMUNITY)
Admission: RE | Admit: 2022-08-24 | Discharge: 2022-08-24 | Disposition: A | Discharge status: Home / Self Care | Payer: Commercial Managed Care - PPO | Source: Ambulatory Visit | Attending: Internal Medicine | Admitting: Internal Medicine

## 2022-08-24 ENCOUNTER — Other Ambulatory Visit: Payer: Self-pay

## 2022-08-24 VITALS — BP 128/74 | HR 65 | Wt 137.0 lb

## 2022-08-24 DIAGNOSIS — I110 Hypertensive heart disease with heart failure: Secondary | ICD-10-CM | POA: Insufficient documentation

## 2022-08-24 DIAGNOSIS — J8410 Pulmonary fibrosis, unspecified: Secondary | ICD-10-CM | POA: Insufficient documentation

## 2022-08-24 DIAGNOSIS — R5383 Other fatigue: Secondary | ICD-10-CM | POA: Diagnosis not present

## 2022-08-24 DIAGNOSIS — Z79899 Other long term (current) drug therapy: Secondary | ICD-10-CM | POA: Diagnosis not present

## 2022-08-24 DIAGNOSIS — R059 Cough, unspecified: Secondary | ICD-10-CM | POA: Diagnosis not present

## 2022-08-24 DIAGNOSIS — Z8249 Family history of ischemic heart disease and other diseases of the circulatory system: Secondary | ICD-10-CM | POA: Diagnosis not present

## 2022-08-24 DIAGNOSIS — M3500 Sicca syndrome, unspecified: Secondary | ICD-10-CM | POA: Diagnosis not present

## 2022-08-24 DIAGNOSIS — I272 Pulmonary hypertension, unspecified: Secondary | ICD-10-CM | POA: Diagnosis not present

## 2022-08-24 DIAGNOSIS — I5032 Chronic diastolic (congestive) heart failure: Secondary | ICD-10-CM | POA: Insufficient documentation

## 2022-08-24 DIAGNOSIS — R0602 Shortness of breath: Secondary | ICD-10-CM | POA: Insufficient documentation

## 2022-08-24 DIAGNOSIS — Z79624 Long term (current) use of inhibitors of nucleotide synthesis: Secondary | ICD-10-CM | POA: Insufficient documentation

## 2022-08-24 DIAGNOSIS — M349 Systemic sclerosis, unspecified: Secondary | ICD-10-CM | POA: Diagnosis not present

## 2022-08-24 MED ORDER — FUROSEMIDE 40 MG PO TABS
40.0000 mg | ORAL_TABLET | ORAL | 6 refills | Status: DC
  Filled 2022-08-24: qty 30, 70d supply, fill #0

## 2022-08-24 MED ORDER — OFEV 150 MG PO CAPS
150.0000 mg | ORAL_CAPSULE | Freq: Two times a day (BID) | ORAL | 5 refills | Status: DC
  Filled 2022-08-24 – 2022-08-25 (×2): qty 60, 30d supply, fill #0

## 2022-08-24 MED ORDER — BISOPROLOL FUMARATE 5 MG PO TABS
5.0000 mg | ORAL_TABLET | Freq: Every day | ORAL | 3 refills | Status: DC
  Filled 2022-08-24 – 2022-09-13 (×2): qty 90, 90d supply, fill #0
  Filled 2023-02-28: qty 90, 90d supply, fill #1

## 2022-08-24 NOTE — Telephone Encounter (Signed)
Spoke with patient regarding Ofev. She states she signed up yesterday for copay card  BIN: [id number] PCN: [id number] Group: [id number] ID: [id number]  Staff message sent to Karle Plumber, PharmD and Zachery Dauer, SPPA for assistance with Opsumit/macitentan cost assistance.  Rx for Ofev sent to [facility]. Routing to [employer] for onboarding needs  Dose:  twice daily  Chesley Mires, PharmD, MPH, BCPS, CPP Clinical Pharmacist (Rheumatology and Pulmonology)

## 2022-08-24 NOTE — Progress Notes (Signed)
ReDS Vest / Clip - 08/24/22 1100       ReDS Vest / Clip   Station Marker A    Ruler Value 23    ReDS Value Range High volume overload    ReDS Actual Value 44

## 2022-08-24 NOTE — Progress Notes (Signed)
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   RHC in 5/19 with minimally elevated pressures and hi-res CT which showed ILD. Follows with Dr. Isaiah Serge . F/u CT in 2/20 showed stable ILD   We saw her in 9/20 markedly SOB and ReDS very high @ 49%. CXR with mild pulmonary vascular congestion. ESR normal. Started lasix 40 daily and kcl 20 daily. Fluid got much better. Then switched to lasix 40 mg MWF but that wasn't enough so lasix increased to 5 days per week.   RHC 4/21  RA = 1 RV = 37/7 PA = 38/16 (25) PCW = 6 Fick cardiac output/index = 5.5/3.4 PVR = 3.4 WU Ao sat = 97% PA sat = 74%, 74% High SVC = 70%  Started macitentan in May 2021   Echo 07/21/21 EF 60-65% RV ok Personally reviewed  Repeat cath 4/23 for worsening CP/SOB   1. Very mild non-obstructive CAD (LAD 20%, OM-1 20%). LVEF 60-65%  Ao = 118/82 (100) LV = 115/9 RA = 3 RV = 35/4 PA = 36/14 (23) PCW = 3 Fick cardiac output/index = 7.3/4.4 PVR = 2.7 Ao sat = 96%  PA sat = 73%, 74% High SVC sat = 78%   Hi-res CT 3/24: Moderate ILD (no change)   Returns for f/u. Feels ok Still working with Karen [name] FT at [facility]. Has been out of Opsumit for about a month due to copay card. Has been more fatigued and SOB. Has nagging cough. Non-productive. No edema, orthopnea or PND. Has not been taking lasix lately. Gets SOB if walks too much. PFT  worsening. Now on OFEV. Has been followed by [facility]   ReDS 44%   Studies:   Echo 10/21 EF 65-70% RV ok Hi-res CT 2/21 and 1/22: Stable ILD Echo 11/18/18: EF hyperdynamic 65-70% RV normal  Echo 5/19: EF 60-65% grade I DD RV normal. Mild TR.   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  Aortic atherosclerosis, three-vessel coronary artery disease.   CT high-resolution 06/27/2018-stable interstitial lung disease. I reviewed the images personally.  Hi res CT 02/05/19: stable ILD    PFTs  FENO 09/06/2017-unable to complete  07/27/2017 FVC 1.65 (54%), FEV1 1.53 (62%], F/F 93, TLC 50, DLCO 44%, DLCO/VA 131% 10/25/2017 FVC 1.51 [50%], FEV1 1.26 [51%], F/F 83 01/29/2018 FVC 1.69 [5%), FEV1 1.50 [61%], F/F 89, DLCO 10.57 [41%] 01/06/2019 FVC 1.58 [52%], FEV1 1.46 [60%], F/F 92, TLC 2.62 [50%], DLCO 9.16 [42%] Severe restriction and diffusion impairment.  FVC is stable  but DLCO has worsened.   6-minute walk  10/23/2017- 144 m Post walk heart rate, stats 94, 91%   02/04/2018- 249 m Post walk heart rate, sats 101, 99%   06/06/2018-212 m Post walk heart rate, sats 86, 92%   01/30/2019-141m Post walk heart rate, sats 83, 98%      Past Medical History:  Diagnosis Date   Acute respiratory failure with hypoxia due to flash pulmonary edema requiring intubation 02/13/2020   Calculus of gallbladder with acute cholecystitis without obstruction    CHF (congestive heart failure)    GERD (gastroesophageal reflux disease)    Hypertension    Hypothyroidism    ILD (interstitial lung disease) 02/08/2018   Interstitial lung disease    Multinodular thyroid    benign FNA 08/2017.    Perimenopause 02/27/2019   Pulmonary edema    Scleroderma    Status post laparoscopic cholecystectomy 02/13/20 02/13/2020   Vitamin D deficiency disease 02/27/2019   Past Surgical History:  Procedure Laterality Date   ABDOMINAL HERNIA REPAIR     BUNIONECTOMY Bilateral 10/2018   CHOLECYSTECTOMY N/A 02/13/2020   Procedure: LAPAROSCOPIC CHOLECYSTECTOMY;  Surgeon: Franky Macho, MD;  Location: [facility] ORS;  Service: General;  Laterality: N/A;   COLONOSCOPY WITH PROPOFOL N/A 01/02/2019   Procedure: COLONOSCOPY WITH PROPOFOL;  Surgeon: Corbin Ade, MD;  Location: [facility] ENDO SUITE;  Service: Endoscopy;  Laterality: N/A;  12:30pm   ESOPHAGOGASTRODUODENOSCOPY (EGD) WITH PROPOFOL N/A 01/28/2018   erosive reflux esophagitis, patulous EG Junction, no dilation, incomplete EGD due to retained food in stomach. GES thereafter with delayed gastric emptying.    RIGHT HEART CATH N/A 09/27/2017   Procedure: RIGHT HEART CATH;  Surgeon: Dolores Patty, MD;  Location: [facility] INVASIVE CV LAB;  Service: Cardiovascular;  Laterality: N/A;   RIGHT HEART CATH N/A 09/05/2019   Procedure: RIGHT HEART CATH;  Surgeon: Dolores Patty, MD;  Location: [facility] INVASIVE CV LAB;  Service: Cardiovascular;  Laterality: N/A;   RIGHT/LEFT HEART CATH AND CORONARY ANGIOGRAPHY N/A 08/18/2021   Procedure: RIGHT/LEFT HEART CATH AND CORONARY ANGIOGRAPHY;  Surgeon: Dolores Patty, MD;  Location: [facility] INVASIVE CV LAB;  Service: Cardiovascular;  Laterality: N/A;   UTERINE FIBROID SURGERY       Current Outpatient Medications  Medication Sig Dispense Refill   acetaminophen (TYLENOL) 325 MG tablet Take 2 tablets (650 mg total) by mouth every 6 (six) hours as needed for mild pain, fever or headache (or Fever >/= 101). 12 tablet 0   amLODipine (NORVASC) 10 MG tablet Take 1 tablet (10 mg total) by mouth daily. 90 tablet 3   benzonatate (TESSALON) 100 MG capsule Take 1 capsule (100 mg total) by mouth 2 (two) times daily as needed for cough. 30 capsule  1   bisoprolol (ZEBETA) 5 MG tablet Take 1 tablet (5 mg total) by mouth daily. 90 tablet 3   clindamycin-benzoyl peroxide (BENZACLIN) gel Apply 1 Application topically as needed.     clobetasol cream (TEMOVATE) 0.05 % Apply 1 Application topically as needed.     esomeprazole (NEXIUM) 40 MG capsule Take 1 capsule (40 mg total) by mouth 2 (two) times daily. 180 capsule 1   Fluocinolone Acetonide Scalp 0.01 % OIL Apply to affected frontal scalp 4 (four) times a week. Not to face 118.28 mL 7   furosemide (LASIX) 40 MG tablet Take 1 tablet (40 mg total) by mouth daily as needed for edema. 30 tablet 6   macitentan (OPSUMIT) 10 MG tablet Take 1 tablet (10  mg total) by mouth daily. 30 tablet 11   meloxicam (MOBIC) 15 MG tablet Take 1 tablet (15 mg total) by mouth daily as needed. 30 tablet 1   Multiple Vitamins-Minerals (HM MULTIVITAMIN ADULT GUMMY PO) Take 2 tablets by mouth daily.     mycophenolate (CELLCEPT) 500 MG tablet Take 3 tablets (1,500 mg total) by mouth every morning AND 3 tablets (1,500 mg total) every evening. 540 tablet 1   Nintedanib (OFEV) 150 MG CAPS Take 1 capsule (150 mg total) by mouth 2 (two) times daily. 60 capsule 4   NP THYROID 120 MG tablet Take 1 tablet (120 mg total) by mouth daily before breakfast. 90 tablet 0   potassium chloride 20 MEQ/15ML (10%) SOLN Take 15 mLs (20 mEq total) by mouth daily with furosemide 450 mL 3   spironolactone (ALDACTONE) 25 MG tablet Take 1 tablet (25 mg total) by mouth 2 (two) times daily. 60 tablet 0   triamcinolone cream (KENALOG) 0.1 % Apply 1 Application topically daily as needed.     No current facility-administered medications for this encounter.    Allergies:   Lisinopril and Tocilizumab   Social History:  The patient  reports that she has never smoked. She has never used smokeless tobacco. She reports that she does not drink alcohol and does not use drugs.   Family History:  The patient's family history includes Diabetes in her maternal  grandfather, maternal uncle, and mother; Hypertension in her brother, father, and sister.   ROS:  Please see the history of present illness.   All other systems are personally reviewed and negative.   Vitals:   08/24/22 1025  BP: 128/74  Pulse: 65  SpO2: 94%  Weight: 62.1 kg (137 lb)     Exam:   General:  Well appearing. No resp difficulty + cough HEENT: normal Neck: supple.  JVP 10. Carotids 2+ bilat; no bruits. No lymphadenopathy or thryomegaly appreciated. Cor: PMI nondisplaced. Regular rate & rhythm. No rubs, gallops or murmurs. Lungs: clear Abdomen: soft, nontender, nondistended. No hepatosplenomegaly. No bruits or masses. Good bowel sounds. Extremities: no cyanosis, clubbing, rash, tr edema Neuro: alert & orientedx3, cranial nerves grossly intact. moves all 4 extremities w/o difficulty. Affect pleasant    Recent Labs: 06/06/2022: ALT 16; B Natriuretic Peptide 52.0; Magnesium 1.9; TSH 0.898 06/07/2022: Hemoglobin 11.9; Platelets 183 06/09/2022: BUN 8; Creatinine, Ser 0.67; Potassium 3.8; Sodium 139  Personally reviewed   Wt Readings from Last 3 Encounters:  08/24/22 62.1 kg (137 lb)  08/17/22 63 kg (138 lb 12.8 oz)  06/22/22 60.8 kg (134 lb)      ASSESSMENT AND PLAN:  1. Chronic diastolic HF - Fluid status elevated. REDs 44% - Echo  07/21/21 EF 60-65% G1DD RV ok Personally reviewed - Currently on lasix 40mg  prn. - Take lasix 80 mg for 2 days then lasix 40 MWF and PRN  - Can consider SGLT2i   2. PAH, mild - Who GROUP I and IIII - has been out of Macitentan. Will work with PharmD to refill. I d/w them personally - RHC 4/23 RA 3 PA 6/14 (23) PCW 3 Fick 7.3/4.4 PVR = 2.7  3. HTN - Blood pressure well controlled. Continue current regimen.   4. Scleroderma - Followed by Dr Nickola Major - continue Cellcept - No change   5. Pulmonary fibrosis - High res CT chest c/w with ILD. Stable 2/21 and 1/22 and 3/24 - Followed by Dr Isaiah Serge - Continue Ofev & Cellcept - Following  in [facility]  Signed, Arvilla Meres, MD  08/24/2022 10:53 AM  Advanced Heart Failure Clinic [facility] [address] [facility] [place] [address] [phone] (office) [phone] (fax)

## 2022-08-24 NOTE — Patient Instructions (Addendum)
Take  of lasix today and tomorrow with potassium, then take 40 mg lasix every Monday, Wednesday and Friday with potassium    Follow-Up in: follow up in 6 months (please call our clinic on September** to schedule your October appointment) with Dr. [name].   If you have any questions or concerns before your next appointment please send [place] a message through [place] or call our office at [phone].    TO LEAVE A MESSAGE FOR THE NURSE SELECT OPTION 2, PLEASE LEAVE A MESSAGE INCLUDING: YOUR NAME DATE OF BIRTH CALL BACK NUMBER REASON FOR CALL**this is important as we prioritize the call backs  YOU WILL RECEIVE A CALL BACK THE SAME DAY AS LONG AS YOU CALL BEFORE 4:00 PM  At the Advanced [facility] Failure Clinic, you and your health needs are our priority. As part of our continuing mission to provide you with exceptional heart care, we have created designated Provider Care Teams. These Care Teams include your primary Cardiologist (physician) and Advanced Practice Providers (APPs- Physician Assistants and Nurse Practitioners) who all work together to provide you with the care you need, when you need it.   You may see any of the following providers on your designated Care Team at your next follow up: Dr Arvilla Meres Dr Marca Ancona Dr. Marcos Eke, NP Robbie Lis, [place] [facility] [place], [place] Brynda Peon, NP Karle Plumber, PharmD   Please be sure to bring in all your medications bottles to every appointment.    Thank you for choosing [facility]

## 2022-08-25 ENCOUNTER — Other Ambulatory Visit (HOSPITAL_COMMUNITY): Payer: Self-pay

## 2022-08-25 ENCOUNTER — Telehealth: Payer: Self-pay | Admitting: Pharmacist

## 2022-08-25 ENCOUNTER — Other Ambulatory Visit: Payer: Self-pay

## 2022-08-25 NOTE — Telephone Encounter (Signed)
Delivery instructions have been updated in [place], medication will be shipped to patient's home address on 08/30/22 (pt states she will be home on Thursday 4/25 and can sign for delivery if required).  Rx has been processed in [facility] and copay card/PAP info has been provided to the pharmacy. The patient has no copay at this time.   Advised pt that [facility] will be reaching out to go over clinical information.

## 2022-08-25 NOTE — Telephone Encounter (Signed)
Called patient to schedule an appointment for the [employer] [facility]. I was unable to reach the patient so I left a HIPAA-compliant message requesting that the patient return my call.   Luke Van Ausdall, PharmD, BCACP, CPP Clinical Pharmacist [facility] [phone]  

## 2022-08-28 ENCOUNTER — Other Ambulatory Visit: Payer: Self-pay

## 2022-08-28 ENCOUNTER — Ambulatory Visit: Payer: Commercial Managed Care - PPO | Attending: Internal Medicine | Admitting: Pharmacist

## 2022-08-28 ENCOUNTER — Other Ambulatory Visit (HOSPITAL_COMMUNITY): Payer: Self-pay

## 2022-08-28 ENCOUNTER — Telehealth: Payer: Self-pay | Admitting: Internal Medicine

## 2022-08-28 DIAGNOSIS — J849 Interstitial pulmonary disease, unspecified: Secondary | ICD-10-CM

## 2022-08-28 DIAGNOSIS — Z79899 Other long term (current) drug therapy: Secondary | ICD-10-CM

## 2022-08-28 MED ORDER — OFEV 150 MG PO CAPS
150.0000 mg | ORAL_CAPSULE | Freq: Two times a day (BID) | ORAL | 5 refills | Status: DC
  Filled 2022-08-28: qty 60, 30d supply, fill #0
  Filled 2022-12-05 – 2022-12-06 (×2): qty 60, 30d supply, fill #1
  Filled 2023-02-28 – 2023-04-12 (×2): qty 60, 30d supply, fill #2
  Filled 2023-05-10 – 2023-05-22 (×2): qty 60, 30d supply, fill #3
  Filled 2023-06-12: qty 60, 30d supply, fill #4

## 2022-08-28 NOTE — Telephone Encounter (Signed)
Copied from CRM [id number]. Topic: General - Other >> Aug 25, 2022  5:19 PM Ja-Kwan M wrote: Reason for CRM: Pt returned Luke's call. Attempted to call the number on the message but there was no answer. Pt requests call back asap.

## 2022-08-28 NOTE — Telephone Encounter (Signed)
Call returned and patient seen.

## 2022-08-28 NOTE — Telephone Encounter (Signed)
Copied from CRM #[id number]. Topic: General - Other >> Aug 25, 2022  5:19 PM Ja-Kwan M wrote: Reason for CRM: Pt returned Luke's call. Attempted to call the number on the message but there was no answer. Pt requests call back asap. 

## 2022-08-28 NOTE — Telephone Encounter (Signed)
Patient call returned and completed documentation for [facility] with [facility].

## 2022-08-28 NOTE — Progress Notes (Signed)
Subjective:  Patient called today by [facility] pharmacy team for Ofev new start counseling.   Patient was last seen by Dr. Isaiah Serge on 03/16/2022. Pertinent past medical history includes ILD.   History of CAD: No History of MI: No Current anticoagulant use: No History of HTN: Yes - managed History of elevated LFTs: Yes mild transminitis last occurring in 2021 but has been wnl since 03/15/2020. Last check was 06/06/22 in our system.  History of diarrhea, nausea, vomiting: Yes  Objective: Allergies  Allergen Reactions   Lisinopril Hives, Swelling and Other (See Comments)   Tocilizumab Other (See Comments)    Swelling and shortness of breath sharpe pains in back and chest tightness.    Outpatient Encounter Medications as of 08/28/2022  Medication Sig   acetaminophen (TYLENOL) 325 MG tablet Take 2 tablets (650 mg total) by mouth every 6 (six) hours as needed for mild pain, fever or headache (or Fever >/= 101).   amLODipine (NORVASC) 10 MG tablet Take 1 tablet (10 mg total) by mouth daily.   benzonatate (TESSALON) 100 MG capsule Take 1 capsule (100 mg total) by mouth 2 (two) times daily as needed for cough.   bisoprolol (ZEBETA) 5 MG tablet Take 1 tablet (5 mg total) by mouth daily.   clindamycin-benzoyl peroxide (BENZACLIN) gel Apply 1 Application topically as needed.   clobetasol cream (TEMOVATE) 0.05 % Apply 1 Application topically as needed.   esomeprazole (NEXIUM) 40 MG capsule Take 1 capsule (40 mg total) by mouth 2 (two) times daily.   Fluocinolone Acetonide Scalp 0.01 % OIL Apply to affected frontal scalp 4 (four) times a week. Not to face   furosemide (LASIX) 40 MG tablet Take 1 tablet (40 mg total) by mouth 3 (three) times a week. Mon, Wed, Fri   macitentan (OPSUMIT) 10 MG tablet Take 1 tablet (10 mg total) by mouth daily.   meloxicam (MOBIC) 15 MG tablet Take 1 tablet (15 mg total) by mouth daily as needed.   Multiple Vitamins-Minerals (HM MULTIVITAMIN ADULT GUMMY PO) Take 2 tablets by  mouth daily.   mycophenolate (CELLCEPT) 500 MG tablet Take 3 tablets (1,500 mg total) by mouth every morning AND 3 tablets (1,500 mg total) every evening.   Nintedanib (OFEV) 150 MG CAPS Take 1 capsule (150 mg total) by mouth 2 (two) times daily.   NP THYROID 120 MG tablet Take 1 tablet (120 mg total) by mouth daily before breakfast.   potassium chloride 20 MEQ/15ML (10%) SOLN Take 15 mLs (20 mEq total) by mouth daily with furosemide   spironolactone (ALDACTONE) 25 MG tablet Take 1 tablet (25 mg total) by mouth 2 (two) times daily.   triamcinolone cream (KENALOG) 0.1 % Apply 1 Application topically daily as needed.   [DISCONTINUED] PARoxetine (PAXIL) 10 MG tablet Take 1 tablet (10 mg total) by mouth daily as directed   No facility-administered encounter medications on file as of 08/28/2022.     Immunization History  Administered Date(s) Administered   Influenza Whole 02/22/2018   Influenza,inj,Quad PF,6+ Mos 02/15/2020   Influenza-Unspecified 01/16/2017, 01/28/2019, 02/13/2022   PFIZER(Purple Top)SARS-COV-2 Vaccination 07/05/2019, 07/25/2019   Tdap 02/27/2019   Zoster Recombinat (Shingrix) 05/19/2021, 09/01/2021      PFT's TLC  Date Value Ref Range Status  01/13/2021 2.48 L Final      CMP     Component Value Date/Time   NA 139 06/09/2022 0412   NA 140 04/13/2022 1542   K 3.8 06/09/2022 0412   CL 105 06/09/2022 0412  CO2 25 06/09/2022 0412   GLUCOSE 91 06/09/2022 0412   BUN 8 06/09/2022 0412   BUN 10 04/13/2022 1542   CREATININE 0.67 06/09/2022 0412   CREATININE 0.57 07/15/2020 0000   CALCIUM 10.0 06/09/2022 0412   PROT 8.6 (H) 06/06/2022 1439   PROT 7.9 11/22/2020 0000   ALBUMIN 4.8 06/06/2022 1439   ALBUMIN 4.7 11/22/2020 0000   AST 23 06/06/2022 1439   ALT 16 06/06/2022 1439   ALKPHOS 99 06/06/2022 1439   BILITOT 0.7 06/06/2022 1439   BILITOT 0.4 11/22/2020 0000   GFRNONAA >60 06/09/2022 0412   GFRNONAA 107 07/15/2020 0000   GFRAA 124 07/15/2020 0000       CBC    Component Value Date/Time   WBC 8.2 06/07/2022 0417   RBC 4.34 06/07/2022 0417   HGB 11.9 (L) 06/07/2022 0417   HCT 39.6 06/07/2022 0417   PLT 183 06/07/2022 0417   MCV 91.2 06/07/2022 0417   MCH 27.4 06/07/2022 0417   MCHC 30.1 06/07/2022 0417   RDW 13.9 06/07/2022 0417   LYMPHSABS 1.0 06/06/2022 1439   MONOABS 0.3 06/06/2022 1439   EOSABS 0.1 06/06/2022 1439   BASOSABS 0.0 06/06/2022 1439      LFT's    Latest Ref Rng & Units 06/06/2022    2:39 PM 11/22/2020   12:00 AM 07/15/2020   12:00 AM  Hepatic Function  Total Protein 6.5 - 8.1 g/dL 8.6  7.9  7.3   Albumin 3.5 - 5.0 g/dL 4.8  4.7    AST 15 - 41 U/L 23  18  15    ALT 0 - 44 U/L 16  9  8    Alk Phosphatase 38 - 126 U/L 99  110    Total Bilirubin 0.3 - 1.2 mg/dL 0.7  0.4  0.4   Bilirubin, Direct 0.0 - 0.2 mg/dL 0.1         HRCT (last done 07/27/22): results followed by Dr. Isaiah Serge   Assessment and Plan  Ofev Medication Management Thoroughly counseled patient on the efficacy, mechanism of action, dosing, administration, adverse effects, and monitoring parameters of Ofev. Patient verbalized understanding.  Goals of Therapy: Will not stop or reverse the progression of ILD. It will slow the progression of ILD.  Inhibits tyrosine kinase inhibitors which slow the fibrosis/progression of ILD -Significant reduction in the rate of disease progression was observed after treatment (61.1% [before] vs 33.3% [after], P?=?0.008) over 42 weeks.  Dosing: 150 mg (one capsule) by mouth twice daily (approx 12 hours apart). Discussed taking with food approximately 12 hours apart. Discussed that capsule should not be crushed or split.  Adverse Effects: Nausea, vomiting, diarrhea (2 in 3 patients) appetite loss, weight loss - management of diarrhea with loperamide discussed including max use of 48 hours and max of 8 capsules per day. Abdominal pain (up to 1 in 5 patients) Nasopharyngitis (13%), UTI (6%) Risk of thrombosis  (3%) and acute MI (2%) Hypertension (5%) Dizziness Fatigue (10%)  Monitoring: Monitor for diarrhea, nausea and vomiting, GI perforation, hepatotoxicity  Monitor LFTs - baseline, monthly for first 6 months, then every 3 months routinely Pregnancy test - baseline prn CBC w differential at baseline and every 3 months routinely  Access: Approval of Ofev through: insurance Rx sent to: [facility]: [phone]   Medication Reconciliation A drug regimen assessment was performed, including review of allergies, interactions, disease-state management, dosing and immunization history. Medications were reviewed with the patient, including name, instructions, indication, goals of  therapy, potential side effects, importance of adherence, and safe use.  Anticoagulant use: No  Immunizations Patient is indicated for the influenzae, pneumonia, and shingles vaccinations. Patient has received COVID19 vaccines.  This appointment required 30 minutes of patient care (this includes precharting, chart review, review of results, face-to-face care, etc.).  Thank you for involving pharmacy to assist in providing this patient's care.   Butch Penny, PharmD, [place]y [place], CPP Clinical Pharmacist [facility] [phone]

## 2022-08-29 ENCOUNTER — Other Ambulatory Visit (HOSPITAL_COMMUNITY): Payer: Self-pay

## 2022-08-30 ENCOUNTER — Other Ambulatory Visit (HOSPITAL_COMMUNITY): Payer: Self-pay

## 2022-08-31 ENCOUNTER — Other Ambulatory Visit (HOSPITAL_COMMUNITY): Payer: Self-pay

## 2022-08-31 ENCOUNTER — Telehealth: Payer: Self-pay | Admitting: Internal Medicine

## 2022-08-31 NOTE — Telephone Encounter (Signed)
Medication Refill - Medication: Nintedanib (OFEV) 150 MG CAPS   Has the patient contacted their pharmacy? No. Patient stated she was waiting on a call from [facility] to discuss her medication and the side affects as well as the mail in option  Preferred Pharmacy (with phone number or street name):   Has the patient been seen for an appointment in the last year OR does the patient have an upcoming appointment? Yes.    Agent: Please be advised that RX refills may take up to 3 business days. We ask that you follow-up with your pharmacy.

## 2022-09-04 ENCOUNTER — Encounter (HOSPITAL_COMMUNITY): Payer: Self-pay | Admitting: Internal Medicine

## 2022-09-04 ENCOUNTER — Other Ambulatory Visit: Payer: Self-pay

## 2022-09-04 ENCOUNTER — Other Ambulatory Visit (HOSPITAL_COMMUNITY): Payer: Self-pay

## 2022-09-04 NOTE — Telephone Encounter (Signed)
Can you provide any updates about the patients Opsumit, I see 4/9 you said they had been trying to contact her

## 2022-09-13 ENCOUNTER — Other Ambulatory Visit (HOSPITAL_COMMUNITY): Payer: Self-pay

## 2022-09-14 ENCOUNTER — Other Ambulatory Visit: Payer: Self-pay

## 2022-09-19 ENCOUNTER — Telehealth (HOSPITAL_COMMUNITY): Payer: Self-pay | Admitting: Pharmacy Technician

## 2022-09-19 NOTE — Telephone Encounter (Signed)
Patient Advocate Encounter   Received notification from [employer] that prior authorization for Opsumit is required.   PA submitted on CoverMyMeds Key  [id number] Status is pending   Will continue to follow.

## 2022-09-19 NOTE — Telephone Encounter (Signed)
Advanced Heart Failure Patient Advocate Encounter  Have spoken with the patient and [facility] several times about Opsumit. Since last message between patient and I, [facility] back to the [facility]. They do this if the patient has been unreachable for a certain amount of time.  This requires the office to send a new referral to the H[facility]). I have spoke with both [facility] and [facility] today. Resent the paperwork and updated the patient.   Will call back later today to see if they have sent the referral [facility] to [facility].

## 2022-09-19 NOTE — Telephone Encounter (Signed)
Advanced Heart Failure Patient Advocate Encounter  Prior Authorization for Opsumit has been approved.    PA# [id number] Effective dates: 09/19/22 through 09/19/23  Archer Asa, CPhT

## 2022-09-20 ENCOUNTER — Other Ambulatory Visit (HOSPITAL_COMMUNITY): Payer: Self-pay

## 2022-09-21 ENCOUNTER — Other Ambulatory Visit (HOSPITAL_COMMUNITY): Payer: Self-pay

## 2022-09-21 DIAGNOSIS — I289 Disease of pulmonary vessels, unspecified: Secondary | ICD-10-CM | POA: Diagnosis not present

## 2022-09-21 DIAGNOSIS — E042 Nontoxic multinodular goiter: Secondary | ICD-10-CM | POA: Diagnosis not present

## 2022-09-21 DIAGNOSIS — M349 Systemic sclerosis, unspecified: Secondary | ICD-10-CM | POA: Diagnosis not present

## 2022-09-21 DIAGNOSIS — Z7682 Awaiting organ transplant status: Secondary | ICD-10-CM | POA: Diagnosis not present

## 2022-09-21 DIAGNOSIS — J8489 Other specified interstitial pulmonary diseases: Secondary | ICD-10-CM | POA: Diagnosis not present

## 2022-09-21 DIAGNOSIS — R918 Other nonspecific abnormal finding of lung field: Secondary | ICD-10-CM | POA: Diagnosis not present

## 2022-09-21 DIAGNOSIS — Z942 Lung transplant status: Secondary | ICD-10-CM | POA: Diagnosis not present

## 2022-09-21 DIAGNOSIS — R0602 Shortness of breath: Secondary | ICD-10-CM | POA: Diagnosis not present

## 2022-09-21 NOTE — Telephone Encounter (Signed)
Advanced Heart Failure Patient Advocate Encounter  Confirm with [facility] that the patient's Opsumit is going to be mailed out today, with an anticipated delivery date of tomorrow 05/17. Advised the patient to let us know if any issues arise.  Archer Asa, CPhT

## 2022-09-25 NOTE — Telephone Encounter (Signed)
Advanced Heart Failure Patient Advocate Encounter  Received confirmation of Opsumit shipment from [facility] via overnight courier 05/17.  Archer Asa, CPhT

## 2022-09-26 ENCOUNTER — Other Ambulatory Visit (HOSPITAL_COMMUNITY): Payer: Self-pay

## 2022-09-27 ENCOUNTER — Other Ambulatory Visit (HOSPITAL_COMMUNITY): Payer: Self-pay

## 2022-09-29 ENCOUNTER — Other Ambulatory Visit (HOSPITAL_COMMUNITY): Payer: Self-pay

## 2022-10-03 ENCOUNTER — Other Ambulatory Visit (HOSPITAL_COMMUNITY): Payer: Self-pay

## 2022-10-05 ENCOUNTER — Other Ambulatory Visit (HOSPITAL_COMMUNITY): Payer: Self-pay

## 2022-10-05 ENCOUNTER — Other Ambulatory Visit: Payer: Self-pay

## 2022-10-05 DIAGNOSIS — D239 Other benign neoplasm of skin, unspecified: Secondary | ICD-10-CM | POA: Diagnosis not present

## 2022-10-06 ENCOUNTER — Encounter: Payer: Self-pay | Admitting: Pulmonary Disease

## 2022-10-06 NOTE — Telephone Encounter (Signed)
PT calling regarding this [facility] message she sent today and wants answer today. Dr. Brooke Bonito shows as off today. Chart shows she has had Clinical Support from [place]. Please call PT to advise. [phone]

## 2022-10-06 NOTE — Telephone Encounter (Signed)
I can not make that decision as I have never seen her. Dr. Isaiah Serge has not seen her since November. Looks like she saw another pulmonologist by the name of Dr. Karie Mainland most recently in May and would advised he make that determination

## 2022-10-09 ENCOUNTER — Ambulatory Visit: Payer: Commercial Managed Care - PPO | Admitting: Internal Medicine

## 2022-10-09 ENCOUNTER — Other Ambulatory Visit (HOSPITAL_COMMUNITY): Payer: Self-pay

## 2022-10-09 ENCOUNTER — Telehealth: Payer: Self-pay | Admitting: Internal Medicine

## 2022-10-09 ENCOUNTER — Encounter (HOSPITAL_COMMUNITY): Payer: Self-pay

## 2022-10-09 ENCOUNTER — Inpatient Hospital Stay (HOSPITAL_COMMUNITY)
Admission: EM | Admit: 2022-10-09 | Discharge: 2022-10-13 | DRG: 389 | Disposition: A | Discharge status: Home / Self Care | Payer: Commercial Managed Care - PPO | Attending: Internal Medicine | Admitting: Internal Medicine

## 2022-10-09 ENCOUNTER — Encounter: Payer: Self-pay | Admitting: Internal Medicine

## 2022-10-09 ENCOUNTER — Other Ambulatory Visit: Payer: Self-pay

## 2022-10-09 ENCOUNTER — Ambulatory Visit (HOSPITAL_BASED_OUTPATIENT_CLINIC_OR_DEPARTMENT_OTHER)
Admission: RE | Admit: 2022-10-09 | Discharge: 2022-10-09 | Disposition: A | Discharge status: Home / Self Care | Payer: Commercial Managed Care - PPO | Source: Ambulatory Visit | Attending: Internal Medicine | Admitting: Internal Medicine

## 2022-10-09 VITALS — BP 133/93 | HR 66 | Ht 65.0 in | Wt 136.2 lb

## 2022-10-09 DIAGNOSIS — Z79899 Other long term (current) drug therapy: Secondary | ICD-10-CM

## 2022-10-09 DIAGNOSIS — E876 Hypokalemia: Secondary | ICD-10-CM | POA: Diagnosis present

## 2022-10-09 DIAGNOSIS — B961 Klebsiella pneumoniae [K. pneumoniae] as the cause of diseases classified elsewhere: Secondary | ICD-10-CM | POA: Diagnosis present

## 2022-10-09 DIAGNOSIS — N3000 Acute cystitis without hematuria: Secondary | ICD-10-CM

## 2022-10-09 DIAGNOSIS — K56609 Unspecified intestinal obstruction, unspecified as to partial versus complete obstruction: Secondary | ICD-10-CM

## 2022-10-09 DIAGNOSIS — E039 Hypothyroidism, unspecified: Secondary | ICD-10-CM | POA: Diagnosis present

## 2022-10-09 DIAGNOSIS — K56600 Partial intestinal obstruction, unspecified as to cause: Principal | ICD-10-CM

## 2022-10-09 DIAGNOSIS — R112 Nausea with vomiting, unspecified: Secondary | ICD-10-CM

## 2022-10-09 DIAGNOSIS — R1314 Dysphagia, pharyngoesophageal phase: Secondary | ICD-10-CM | POA: Diagnosis present

## 2022-10-09 DIAGNOSIS — I272 Pulmonary hypertension, unspecified: Secondary | ICD-10-CM | POA: Diagnosis present

## 2022-10-09 DIAGNOSIS — I5032 Chronic diastolic (congestive) heart failure: Secondary | ICD-10-CM

## 2022-10-09 DIAGNOSIS — K3184 Gastroparesis: Secondary | ICD-10-CM | POA: Diagnosis present

## 2022-10-09 DIAGNOSIS — M349 Systemic sclerosis, unspecified: Secondary | ICD-10-CM | POA: Diagnosis present

## 2022-10-09 DIAGNOSIS — K5651 Intestinal adhesions [bands], with partial obstruction: Principal | ICD-10-CM | POA: Diagnosis present

## 2022-10-09 DIAGNOSIS — Z888 Allergy status to other drugs, medicaments and biological substances status: Secondary | ICD-10-CM

## 2022-10-09 DIAGNOSIS — Z833 Family history of diabetes mellitus: Secondary | ICD-10-CM

## 2022-10-09 DIAGNOSIS — N3001 Acute cystitis with hematuria: Secondary | ICD-10-CM | POA: Diagnosis present

## 2022-10-09 DIAGNOSIS — K2100 Gastro-esophageal reflux disease with esophagitis, without bleeding: Secondary | ICD-10-CM | POA: Diagnosis present

## 2022-10-09 DIAGNOSIS — J849 Interstitial pulmonary disease, unspecified: Secondary | ICD-10-CM | POA: Diagnosis present

## 2022-10-09 DIAGNOSIS — Z8249 Family history of ischemic heart disease and other diseases of the circulatory system: Secondary | ICD-10-CM

## 2022-10-09 DIAGNOSIS — I700 Atherosclerosis of aorta: Secondary | ICD-10-CM | POA: Diagnosis not present

## 2022-10-09 DIAGNOSIS — Z1629 Resistance to other single specified antibiotic: Secondary | ICD-10-CM | POA: Diagnosis present

## 2022-10-09 DIAGNOSIS — R109 Unspecified abdominal pain: Secondary | ICD-10-CM | POA: Diagnosis not present

## 2022-10-09 DIAGNOSIS — K219 Gastro-esophageal reflux disease without esophagitis: Secondary | ICD-10-CM | POA: Diagnosis present

## 2022-10-09 DIAGNOSIS — I110 Hypertensive heart disease with heart failure: Secondary | ICD-10-CM | POA: Diagnosis present

## 2022-10-09 DIAGNOSIS — E860 Dehydration: Secondary | ICD-10-CM | POA: Diagnosis present

## 2022-10-09 MED ORDER — IOHEXOL 300 MG/ML  SOLN
100.0000 mL | Freq: Once | INTRAMUSCULAR | Status: AC | PRN
  Administered 2022-10-09: 100 mL via INTRAVENOUS

## 2022-10-09 MED ORDER — ONDANSETRON HCL 4 MG/2ML IJ SOLN
4.0000 mg | Freq: Once | INTRAMUSCULAR | Status: AC
  Administered 2022-10-10: 4 mg via INTRAVENOUS
  Filled 2022-10-09: qty 2

## 2022-10-09 MED ORDER — ONDANSETRON HCL 4 MG PO TABS
4.0000 mg | ORAL_TABLET | Freq: Three times a day (TID) | ORAL | 0 refills | Status: DC | PRN
  Filled 2022-10-09 – 2022-10-13 (×2): qty 20, 7d supply, fill #0

## 2022-10-09 MED ORDER — SODIUM CHLORIDE 0.9 % IV BOLUS
1000.0000 mL | Freq: Once | INTRAVENOUS | Status: AC
  Administered 2022-10-10: 1000 mL via INTRAVENOUS

## 2022-10-09 NOTE — ED Triage Notes (Addendum)
Pt was sent to [facility] from PCP to get CT for NVD. PT received ct at [facility]. Then was told by [facility] to come to [place] to be admitted. Pt does not know diagnosis or anythign. She was just told to come here  and be admitted bc of CT results.

## 2022-10-09 NOTE — Patient Instructions (Signed)
Please get CT abdomen pelvis done as scheduled.  Avoid solid food for now.  Please take Zofran as needed for nausea.

## 2022-10-09 NOTE — Assessment & Plan Note (Signed)
Abdominal pain, nausea and vomiting X 2 days Bowel sounds hypoactive today Tried to contact [facility] ER, but CT scan not functioning today Check CT abdomen pelvis with contrast stat at [facility] Likely as an effect of scleroderma Has BM now, but has to strain and has hard pebbles of stool Needs to maintain adequate hydration Zofran PRN for nausea

## 2022-10-09 NOTE — Telephone Encounter (Signed)
FMLA  Noted  Copied Sleeved  Original in PCP box Copy front desk folder

## 2022-10-09 NOTE — Progress Notes (Signed)
Acute Office Visit  Subjective:    Patient ID: Karen [name], female    DOB: 17-Mar-1971, 52 y.o.   MRN: [id number]  Chief Complaint  Patient presents with   Abdominal Pain    Patient states she has had stomach pains and vomiting since Saturday evening    HPI Patient is in today for complaint of abdominal pain, nausea and vomiting for the last 2 days.  She has had nonbloody, nonbilious vomiting, multiple times.  She had 2 episodes of vomiting since this morning.  Her last bowel movement was today.  On physical exam, she has hypoactive bowel sounds.  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  Denies any fever, chills, hematemesis, melena or hematochezia currently.  Past Medical History:  Diagnosis Date   Acute respiratory failure with hypoxia due to flash pulmonary edema requiring intubation 02/13/2020   Calculus of gallbladder with acute cholecystitis without obstruction    CHF (congestive heart failure) (HCC)    GERD (gastroesophageal reflux disease)    Hypertension    Hypothyroidism    ILD (interstitial lung disease) (HCC) 02/08/2018   Interstitial lung disease (HCC)    Multinodular thyroid    benign FNA 08/2017.   Perimenopause 02/27/2019   Pulmonary edema    Scleroderma (HCC)    Status post laparoscopic cholecystectomy 02/13/20 02/13/2020   Vitamin D deficiency disease 02/27/2019    Past Surgical History:  Procedure Laterality Date   ABDOMINAL HERNIA REPAIR     BUNIONECTOMY Bilateral 10/2018   CHOLECYSTECTOMY N/A 02/13/2020   Procedure: LAPAROSCOPIC CHOLECYSTECTOMY;  Surgeon: Franky Macho, MD;  Location: [facility] ORS;  Service: General;  Laterality: N/A;   COLONOSCOPY WITH PROPOFOL N/A 01/02/2019   Procedure: COLONOSCOPY WITH PROPOFOL;  Surgeon: Corbin Ade, MD;  Location: [facility] ENDO SUITE;  Service: Endoscopy;  Laterality: N/A;  12:30pm   ESOPHAGOGASTRODUODENOSCOPY (EGD) WITH PROPOFOL N/A  01/28/2018   erosive reflux esophagitis, patulous EG Junction, no dilation, incomplete EGD due to retained food in stomach. GES thereafter with delayed gastric emptying.    RIGHT HEART CATH N/A 09/27/2017   Procedure: RIGHT HEART CATH;  Surgeon: Dolores Patty, MD;  Location: [facility] INVASIVE CV LAB;  Service: Cardiovascular;  Laterality: N/A;   RIGHT HEART CATH N/A 09/05/2019   Procedure: RIGHT HEART CATH;  Surgeon: Dolores Patty, MD;  Location: [facility] INVASIVE CV LAB;  Service: Cardiovascular;  Laterality: N/A;   RIGHT/LEFT HEART CATH AND CORONARY ANGIOGRAPHY N/A 08/18/2021   Procedure: RIGHT/LEFT HEART CATH AND CORONARY ANGIOGRAPHY;  Surgeon: Dolores Patty, MD;  Location: [facility] INVASIVE CV LAB;  Service: Cardiovascular;  Laterality: N/A;   UTERINE FIBROID SURGERY      Family History  Problem Relation Age of Onset   Hypertension Father    Hypertension Sister    Hypertension Brother    Diabetes Maternal Grandfather    Diabetes Maternal Uncle    Diabetes Mother    Colon cancer Neg Hx     Social History   Socioeconomic History   Marital status: Single    Spouse name: Not on file   Number of children: 0   Years of education: Not on file   Highest education level: Associate degree: occupational, Scientist, product/process development, or vocational program  Occupational History   Occupation: CMA    Employer: [employer]  Tobacco Use   Smoking status: Never   Smokeless tobacco: Never  Vaping Use   Vaping Use: Former  Substance and Sexual Activity   Alcohol use: No   Drug use: No   Sexual activity: Yes  Other Topics Concern   Not on file  Social History Narrative   Patient is right-handed. She lives alone in one level home, a few steps to enter.CMA [employer].   Social Determinants of Health   Financial Resource Strain: Not on file  Food Insecurity: No Food Insecurity (06/06/2022)   Hunger Vital Sign    Worried About Running Out of Food in the Last Year: Never true    Ran Out  of Food in the Last Year: Never true  Transportation Needs: No Transportation Needs (06/06/2022)   PRAPARE - Administrator, Civil Service (Medical): No    Lack of Transportation (Non-Medical): No  Physical Activity: Not on file  Stress: Not on file  Social Connections: Not on file  Intimate Partner Violence: Not At Risk (06/06/2022)   Humiliation, Afraid, Rape, and Kick questionnaire    Fear of Current or Ex-Partner: No    Emotionally Abused: No    Physically Abused: No    Sexually Abused: No    Outpatient Medications Prior to Visit  Medication Sig Dispense Refill   acetaminophen (TYLENOL) 325 MG tablet Take 2 tablets (650 mg total) by mouth every 6 (six) hours as needed for mild pain, fever or headache (or Fever >/= 101). 12 tablet 0   amLODipine (NORVASC) 10 MG tablet Take 1 tablet (10 mg total) by mouth daily. 90 tablet 3   benzonatate (TESSALON) 100 MG capsule Take 1 capsule (100 mg total) by mouth 2 (two) times daily as needed for cough. 30 capsule 1   bisoprolol (ZEBETA) 5 MG tablet Take 1 tablet (5 mg total) by mouth daily. 90 tablet 3   clindamycin-benzoyl peroxide (BENZACLIN) gel Apply 1 Application topically as needed.     clobetasol cream (TEMOVATE) 0.05 % Apply 1 Application topically as needed.     esomeprazole (NEXIUM) 40 MG capsule Take 1 capsule (40 mg total) by mouth 2 (two) times daily. 180 capsule 1   Fluocinolone Acetonide Scalp 0.01 % OIL Apply to affected frontal scalp 4 (four) times a week. Not to face 118.28 mL 7   furosemide (LASIX) 40 MG tablet Take 1 tablet (40 mg total) by mouth 3 (three) times a week. Mon, Wed, Fri 30 tablet 6   macitentan (OPSUMIT) 10 MG tablet Take 1 tablet (10 mg total) by mouth daily. 30 tablet 11   meloxicam (MOBIC) 15 MG tablet Take 1 tablet (15 mg total) by mouth daily as needed. 30 tablet 1   Multiple Vitamins-Minerals (HM MULTIVITAMIN ADULT GUMMY PO) Take 2 tablets by mouth daily.     mycophenolate (CELLCEPT) 500 MG tablet  Take 3 tablets (1,500 mg total) by mouth every morning AND 3 tablets (1,500 mg total) every evening. 540 tablet 1   Nintedanib (OFEV) 150 MG CAPS Take 1 capsule (150 mg total) by mouth 2 (two) times daily. 60 capsule 5   NP THYROID 120 MG tablet Take 1 tablet (120 mg total) by mouth daily before breakfast. 90 tablet 0   potassium chloride 20 MEQ/15ML (10%) SOLN Take 15 mLs (20 mEq total) by mouth daily with furosemide 450 mL 3   spironolactone (ALDACTONE) 25 MG tablet Take 1 tablet (25 mg total) by mouth 2 (two) times daily. 60 tablet 0   triamcinolone cream (KENALOG) 0.1 % Apply 1 Application topically daily as needed.     No facility-administered medications  prior to visit.    Allergies  Allergen Reactions   Lisinopril Hives, Swelling and Other (See Comments)   Tocilizumab Other (See Comments)    Swelling and shortness of breath sharpe pains in back and chest tightness.    Review of Systems  Constitutional:  Positive for fatigue. Negative for chills and fever.  HENT:  Negative for congestion, sinus pressure, sinus pain and sore throat.   Eyes:  Negative for pain and discharge.  Respiratory:  Positive for shortness of breath (intermittent). Negative for cough.   Cardiovascular:  Negative for chest pain and palpitations.  Gastrointestinal:  Positive for abdominal pain, constipation, nausea and vomiting.  Endocrine: Negative for polydipsia and polyuria.  Genitourinary:  Negative for dysuria and hematuria.  Musculoskeletal:  Positive for arthralgias. Negative for neck pain and neck stiffness.  Skin:  Negative for rash.  Neurological:  Negative for dizziness and weakness.  Psychiatric/Behavioral:  Negative for agitation and behavioral problems.        Objective:    Physical Exam Vitals reviewed.  Constitutional:      General: She is not in acute distress.    Appearance: She is not diaphoretic.  HENT:     Head: Normocephalic and atraumatic.     Nose: No congestion.      Mouth/Throat:     Mouth: Mucous membranes are moist.  Eyes:     General: No scleral icterus.    Extraocular Movements: Extraocular movements intact.  Cardiovascular:     Rate and Rhythm: Normal rate and regular rhythm.     Pulses: Normal pulses.     Heart sounds: Normal heart sounds. No murmur heard. Pulmonary:     Breath sounds: Normal breath sounds. No wheezing or rales.  Abdominal:     General: There is no distension.     Palpations: Abdomen is soft.     Tenderness: There is abdominal tenderness (Mild, epigastric and periumbilical).     Comments: Hypoactive bowel sounds  Musculoskeletal:     Cervical back: Neck supple. No tenderness.     Right lower leg: No edema.     Left lower leg: No edema.  Skin:    General: Skin is warm.     Findings: No rash.  Neurological:     General: No focal deficit present.     Mental Status: She is alert and oriented to person, place, and time.     Sensory: No sensory deficit.     Motor: No weakness.  Psychiatric:        Mood and Affect: Mood normal.        Behavior: Behavior normal.     BP (!) 133/93 (BP Location: Right Arm, Patient Position: Sitting, Cuff Size: Normal)   Pulse 66   Ht 5\' 5"  (1.651 m)   Wt 136 lb 3.2 oz (61.8 kg)   SpO2 98%   BMI 22.66 kg/m  Wt Readings from Last 3 Encounters:  10/09/22 136 lb 3.2 oz (61.8 kg)  08/24/22 137 lb (62.1 kg)  08/17/22 138 lb 12.8 oz (63 kg)        Assessment & Plan:   Problem List Items Addressed This Visit       Digestive   SBO (small bowel obstruction) (HCC) - Primary    Abdominal pain, nausea and vomiting X 2 days Bowel sounds hypoactive today Tried to contact [facility] ER, but CT scan not functioning today Check CT abdomen pelvis with contrast stat at [facility] Likely as an effect of scleroderma  Has BM now, but has to strain and has hard pebbles of stool Needs to maintain adequate hydration Zofran PRN for nausea      Relevant Orders   CT Abdomen Pelvis W Contrast      Other   Scleroderma (HCC)    Followed by Dr. Nickola Major Has multiple complications - ILD, pulmonary HTN, esophageal dysphagia and recently SBO      Other Visit Diagnoses     Nausea and vomiting, unspecified vomiting type       Relevant Medications   ondansetron (ZOFRAN) 4 MG tablet        Meds ordered this encounter  Medications   ondansetron (ZOFRAN) 4 MG tablet    Sig: Take 1 tablet (4 mg total) by mouth every 8 (eight) hours as needed for nausea or vomiting.    Dispense:  20 tablet    Refill:  0     Sulay Brymer Concha Se, MD

## 2022-10-09 NOTE — Assessment & Plan Note (Signed)
Followed by Dr. Hawkes Has multiple complications - ILD, pulmonary HTN, esophageal dysphagia and recently SBO 

## 2022-10-10 ENCOUNTER — Encounter (HOSPITAL_COMMUNITY): Payer: Self-pay | Admitting: Internal Medicine

## 2022-10-10 ENCOUNTER — Inpatient Hospital Stay (HOSPITAL_COMMUNITY): Payer: Commercial Managed Care - PPO

## 2022-10-10 ENCOUNTER — Emergency Department (HOSPITAL_COMMUNITY): Payer: Commercial Managed Care - PPO

## 2022-10-10 DIAGNOSIS — Z1629 Resistance to other single specified antibiotic: Secondary | ICD-10-CM | POA: Diagnosis not present

## 2022-10-10 DIAGNOSIS — I272 Pulmonary hypertension, unspecified: Secondary | ICD-10-CM | POA: Diagnosis not present

## 2022-10-10 DIAGNOSIS — N3001 Acute cystitis with hematuria: Secondary | ICD-10-CM | POA: Diagnosis not present

## 2022-10-10 DIAGNOSIS — K56609 Unspecified intestinal obstruction, unspecified as to partial versus complete obstruction: Secondary | ICD-10-CM

## 2022-10-10 DIAGNOSIS — K219 Gastro-esophageal reflux disease without esophagitis: Secondary | ICD-10-CM | POA: Diagnosis present

## 2022-10-10 DIAGNOSIS — Z4659 Encounter for fitting and adjustment of other gastrointestinal appliance and device: Secondary | ICD-10-CM | POA: Diagnosis not present

## 2022-10-10 DIAGNOSIS — E039 Hypothyroidism, unspecified: Secondary | ICD-10-CM | POA: Diagnosis not present

## 2022-10-10 DIAGNOSIS — J849 Interstitial pulmonary disease, unspecified: Secondary | ICD-10-CM | POA: Diagnosis not present

## 2022-10-10 DIAGNOSIS — Z452 Encounter for adjustment and management of vascular access device: Secondary | ICD-10-CM | POA: Diagnosis not present

## 2022-10-10 DIAGNOSIS — R1314 Dysphagia, pharyngoesophageal phase: Secondary | ICD-10-CM | POA: Diagnosis not present

## 2022-10-10 DIAGNOSIS — M349 Systemic sclerosis, unspecified: Secondary | ICD-10-CM | POA: Diagnosis not present

## 2022-10-10 DIAGNOSIS — I110 Hypertensive heart disease with heart failure: Secondary | ICD-10-CM | POA: Diagnosis not present

## 2022-10-10 DIAGNOSIS — K3184 Gastroparesis: Secondary | ICD-10-CM | POA: Diagnosis present

## 2022-10-10 DIAGNOSIS — K5669 Other partial intestinal obstruction: Secondary | ICD-10-CM | POA: Diagnosis not present

## 2022-10-10 DIAGNOSIS — Z888 Allergy status to other drugs, medicaments and biological substances status: Secondary | ICD-10-CM | POA: Diagnosis not present

## 2022-10-10 DIAGNOSIS — K5651 Intestinal adhesions [bands], with partial obstruction: Secondary | ICD-10-CM | POA: Diagnosis not present

## 2022-10-10 DIAGNOSIS — B961 Klebsiella pneumoniae [K. pneumoniae] as the cause of diseases classified elsewhere: Secondary | ICD-10-CM | POA: Diagnosis present

## 2022-10-10 DIAGNOSIS — I5032 Chronic diastolic (congestive) heart failure: Secondary | ICD-10-CM | POA: Diagnosis not present

## 2022-10-10 DIAGNOSIS — Z8249 Family history of ischemic heart disease and other diseases of the circulatory system: Secondary | ICD-10-CM | POA: Diagnosis not present

## 2022-10-10 DIAGNOSIS — E876 Hypokalemia: Secondary | ICD-10-CM | POA: Diagnosis not present

## 2022-10-10 DIAGNOSIS — K2100 Gastro-esophageal reflux disease with esophagitis, without bleeding: Secondary | ICD-10-CM | POA: Diagnosis present

## 2022-10-10 DIAGNOSIS — E860 Dehydration: Secondary | ICD-10-CM | POA: Diagnosis not present

## 2022-10-10 DIAGNOSIS — Z79899 Other long term (current) drug therapy: Secondary | ICD-10-CM | POA: Diagnosis not present

## 2022-10-10 DIAGNOSIS — Z833 Family history of diabetes mellitus: Secondary | ICD-10-CM | POA: Diagnosis not present

## 2022-10-10 LAB — URINALYSIS, ROUTINE W REFLEX MICROSCOPIC
Bilirubin Urine: NEGATIVE
Glucose, UA: NEGATIVE mg/dL
Ketones, ur: NEGATIVE mg/dL
Nitrite: POSITIVE — AB
Protein, ur: 30 mg/dL — AB
Specific Gravity, Urine: >1.046 — ABNORMAL HIGH (ref 1.005–1.030)
WBC, UA: >50 WBC/hpf (ref 0–5)
pH: 5 (ref 5.0–8.0)

## 2022-10-10 LAB — CBC
HCT: 37.2 % (ref 36.0–46.0)
Hemoglobin: 11.3 g/dL — ABNORMAL LOW (ref 12.0–15.0)
MCH: 27.5 pg (ref 26.0–34.0)
MCHC: 30.4 g/dL (ref 30.0–36.0)
MCV: 90.5 fL (ref 80.0–100.0)
Platelets: 211 10*3/uL (ref 150–400)
RBC: 4.11 MIL/uL (ref 3.87–5.11)
RDW: 13.3 % (ref 11.5–15.5)
WBC: 7.6 10*3/uL (ref 4.0–10.5)
nRBC: 0 % (ref 0.0–0.2)

## 2022-10-10 LAB — CBC WITH DIFFERENTIAL/PLATELET
Abs Immature Granulocytes: 0.01 10*3/uL (ref 0.00–0.07)
Abs Immature Granulocytes: 0.02 10*3/uL (ref 0.00–0.07)
Basophils Absolute: 0 10*3/uL (ref 0.0–0.1)
Basophils Absolute: 0 10*3/uL (ref 0.0–0.1)
Basophils Relative: 0 %
Basophils Relative: 1 %
Eosinophils Absolute: 0.2 10*3/uL (ref 0.0–0.5)
Eosinophils Absolute: 0.2 10*3/uL (ref 0.0–0.5)
Eosinophils Relative: 3 %
Eosinophils Relative: 3 %
HCT: 37.9 % (ref 36.0–46.0)
HCT: 40.1 % (ref 36.0–46.0)
Hemoglobin: 11.7 g/dL — ABNORMAL LOW (ref 12.0–15.0)
Hemoglobin: 12.4 g/dL (ref 12.0–15.0)
Immature Granulocytes: 0 %
Immature Granulocytes: 0 %
Lymphocytes Relative: 32 %
Lymphocytes Relative: 37 %
Lymphs Abs: 2.3 10*3/uL (ref 0.7–4.0)
Lymphs Abs: 2.7 10*3/uL (ref 0.7–4.0)
MCH: 27.3 pg (ref 26.0–34.0)
MCH: 28.1 pg (ref 26.0–34.0)
MCHC: 30.9 g/dL (ref 30.0–36.0)
MCHC: 30.9 g/dL (ref 30.0–36.0)
MCV: 88.6 fL (ref 80.0–100.0)
MCV: 90.9 fL (ref 80.0–100.0)
Monocytes Absolute: 0.6 10*3/uL (ref 0.1–1.0)
Monocytes Absolute: 0.6 10*3/uL (ref 0.1–1.0)
Monocytes Relative: 9 %
Monocytes Relative: 9 %
Neutro Abs: 3.7 10*3/uL (ref 1.7–7.7)
Neutro Abs: 4 10*3/uL (ref 1.7–7.7)
Neutrophils Relative %: 50 %
Neutrophils Relative %: 56 %
Platelets: 202 10*3/uL (ref 150–400)
Platelets: 212 10*3/uL (ref 150–400)
RBC: 4.28 MIL/uL (ref 3.87–5.11)
RBC: 4.41 MIL/uL (ref 3.87–5.11)
RDW: 13.3 % (ref 11.5–15.5)
RDW: 13.4 % (ref 11.5–15.5)
WBC: 7.2 10*3/uL (ref 4.0–10.5)
WBC: 7.2 10*3/uL (ref 4.0–10.5)
nRBC: 0 % (ref 0.0–0.2)
nRBC: 0 % (ref 0.0–0.2)

## 2022-10-10 LAB — COMPREHENSIVE METABOLIC PANEL
ALT: 12 U/L (ref 0–44)
ALT: 12 U/L (ref 0–44)
AST: 17 U/L (ref 15–41)
AST: 17 U/L (ref 15–41)
Albumin: 3.5 g/dL (ref 3.5–5.0)
Albumin: 3.6 g/dL (ref 3.5–5.0)
Alkaline Phosphatase: 76 U/L (ref 38–126)
Alkaline Phosphatase: 77 U/L (ref 38–126)
Anion gap: 10 (ref 5–15)
Anion gap: 11 (ref 5–15)
BUN: 8 mg/dL (ref 6–20)
BUN: 8 mg/dL (ref 6–20)
CO2: 24 mmol/L (ref 22–32)
CO2: 24 mmol/L (ref 22–32)
Calcium: 10.1 mg/dL (ref 8.9–10.3)
Calcium: 9.7 mg/dL (ref 8.9–10.3)
Chloride: 106 mmol/L (ref 98–111)
Chloride: 106 mmol/L (ref 98–111)
Creatinine, Ser: 0.59 mg/dL (ref 0.44–1.00)
Creatinine, Ser: 0.67 mg/dL (ref 0.44–1.00)
GFR, Estimated: >60 mL/min (ref >60)
GFR, Estimated: >60 mL/min (ref >60)
Glucose, Bld: 92 mg/dL (ref 70–99)
Glucose, Bld: 95 mg/dL (ref 70–99)
Potassium: 2.9 mmol/L — ABNORMAL LOW (ref 3.5–5.1)
Potassium: 3.1 mmol/L — ABNORMAL LOW (ref 3.5–5.1)
Sodium: 140 mmol/L (ref 135–145)
Sodium: 141 mmol/L (ref 135–145)
Total Bilirubin: 0.8 mg/dL (ref 0.3–1.2)
Total Bilirubin: 0.9 mg/dL (ref 0.3–1.2)
Total Protein: 6.9 g/dL (ref 6.5–8.1)
Total Protein: 6.9 g/dL (ref 6.5–8.1)

## 2022-10-10 LAB — CREATININE, SERUM
Creatinine, Ser: 0.64 mg/dL (ref 0.44–1.00)
GFR, Estimated: >60 mL/min (ref >60)

## 2022-10-10 LAB — MAGNESIUM: Magnesium: 1.4 mg/dL — ABNORMAL LOW (ref 1.7–2.4)

## 2022-10-10 LAB — LIPASE, BLOOD: Lipase: 22 U/L (ref 11–51)

## 2022-10-10 MED ORDER — SODIUM CHLORIDE 0.9 % IV SOLN
1.0000 g | Freq: Once | INTRAVENOUS | Status: AC
  Administered 2022-10-10: 1 g via INTRAVENOUS
  Filled 2022-10-10: qty 10

## 2022-10-10 MED ORDER — PHENOL 1.4 % MT LIQD
1.0000 | OROMUCOSAL | Status: DC | PRN
  Filled 2022-10-10: qty 177

## 2022-10-10 MED ORDER — ONDANSETRON HCL 4 MG/2ML IJ SOLN
4.0000 mg | Freq: Four times a day (QID) | INTRAMUSCULAR | Status: DC | PRN
  Administered 2022-10-10 – 2022-10-12 (×2): 4 mg via INTRAVENOUS
  Filled 2022-10-10 (×3): qty 2

## 2022-10-10 MED ORDER — HYDROMORPHONE HCL 1 MG/ML IJ SOLN
0.5000 mg | Freq: Once | INTRAMUSCULAR | Status: AC
  Administered 2022-10-10: 0.5 mg via INTRAVENOUS
  Filled 2022-10-10: qty 1

## 2022-10-10 MED ORDER — ACETAMINOPHEN 650 MG RE SUPP: 650.0000 mg | Freq: Four times a day (QID) | RECTAL | Status: DC | PRN

## 2022-10-10 MED ORDER — ENOXAPARIN SODIUM 40 MG/0.4ML IJ SOSY
40.0000 mg | PREFILLED_SYRINGE | Freq: Every day | INTRAMUSCULAR | Status: DC
  Administered 2022-10-10 – 2022-10-13 (×4): 40 mg via SUBCUTANEOUS
  Filled 2022-10-10 (×4): qty 0.4

## 2022-10-10 MED ORDER — ACETAMINOPHEN 325 MG PO TABS: 650.0000 mg | ORAL_TABLET | Freq: Four times a day (QID) | ORAL | Status: DC | PRN

## 2022-10-10 MED ORDER — SODIUM CHLORIDE 0.9% FLUSH
3.0000 mL | Freq: Two times a day (BID) | INTRAVENOUS | Status: DC
  Administered 2022-10-10 – 2022-10-13 (×7): 3 mL via INTRAVENOUS

## 2022-10-10 MED ORDER — NALOXONE HCL 0.4 MG/ML IJ SOLN: 0.4000 mg | INTRAMUSCULAR | Status: DC | PRN

## 2022-10-10 MED ORDER — DIATRIZOATE MEGLUMINE & SODIUM 66-10 % PO SOLN
90.0000 mL | Freq: Once | ORAL | Status: AC
  Administered 2022-10-10: 90 mL via NASOGASTRIC
  Filled 2022-10-10: qty 90

## 2022-10-10 MED ORDER — LACTATED RINGERS IV SOLN: INTRAVENOUS | Status: AC

## 2022-10-10 MED ORDER — SODIUM CHLORIDE 0.9 % IV SOLN
1.0000 g | INTRAVENOUS | Status: DC
  Administered 2022-10-10: 1 g via INTRAVENOUS
  Filled 2022-10-10: qty 10

## 2022-10-10 MED ORDER — PANTOPRAZOLE SODIUM 40 MG IV SOLR
40.0000 mg | INTRAVENOUS | Status: DC
  Administered 2022-10-10 – 2022-10-12 (×3): 40 mg via INTRAVENOUS
  Filled 2022-10-10 (×3): qty 10

## 2022-10-10 MED ORDER — KETOROLAC TROMETHAMINE 15 MG/ML IJ SOLN
15.0000 mg | Freq: Four times a day (QID) | INTRAMUSCULAR | Status: DC | PRN
  Administered 2022-10-10 – 2022-10-11 (×2): 15 mg via INTRAVENOUS
  Filled 2022-10-10 (×3): qty 1

## 2022-10-10 MED ORDER — HYDROMORPHONE HCL 1 MG/ML IJ SOLN
0.5000 mg | INTRAMUSCULAR | Status: DC | PRN
  Administered 2022-10-10 (×3): 0.5 mg via INTRAVENOUS
  Filled 2022-10-10: qty 1
  Filled 2022-10-10 (×2): qty 0.5

## 2022-10-10 MED ORDER — MAGNESIUM SULFATE 2 GM/50ML IV SOLN
2.0000 g | Freq: Once | INTRAVENOUS | Status: AC
  Administered 2022-10-10: 2 g via INTRAVENOUS
  Filled 2022-10-10: qty 50

## 2022-10-10 MED ORDER — POTASSIUM CHLORIDE 10 MEQ/100ML IV SOLN
10.0000 meq | INTRAVENOUS | Status: AC
  Administered 2022-10-10 (×3): 10 meq via INTRAVENOUS
  Filled 2022-10-10 (×3): qty 100

## 2022-10-10 NOTE — ED Notes (Signed)
Lab notified for urine culture ?

## 2022-10-10 NOTE — Consult Note (Signed)
Consult Note  Karen [name] 10/22/1970  [id number].    Requesting MD: Dr. Arlean Hopping Chief Complaint/Reason for Consult: SBO  HPI:  52 y.o. female with medical history significant for GERD, CHF, hypothyroidism, scleroderma with ILD, pulm HTN, esophageal dysphagia, gastroparesis who presented to [facility] ED with abdominal pain, nausea, vomiting.  Symptoms began approximately 2 days ago for which she saw her PCP yesterday (6/3) who recommended CT and that she present to ED.  She has not tolerated p.o. for 2 days with recurrent nausea/vomiting.  She reported last flatus/BM was Saturday. She denies hematemesis, hematochezia, melena, urinary symptoms, fever, chills.  She has a history of a prior SBO for which she was admitted 05/2022 and states current symptoms feel similar.  Prior episode resolved with conservative treatment.   CT shows partial small bowel obstruction.  She has been admitted to the medicine service and general surgery asked to consult.  Since admission she has had NGT placed.  She states abdominal pain is improved and no further nausea/vomiting.  She is not passing flatus.   She is followed by pulmonology and transplant team at [facility] for her scleroderma/ILD.  Substance use: She denies alcohol and substance use Allergies: Lisinopril, hives Blood thinners: None Past Surgeries: Laparoscopic cholecystectomy, abdominal hernia repair, uterine fibroid surgery   ROS: ROS as above  Family History  Problem Relation Age of Onset   Hypertension Father    Hypertension Sister    Hypertension Brother    Diabetes Maternal Grandfather    Diabetes Maternal Uncle    Diabetes Mother    Colon cancer Neg Hx     Past Medical History:  Diagnosis Date   Acute respiratory failure with hypoxia due to flash pulmonary edema requiring intubation 02/13/2020   Calculus of gallbladder with acute cholecystitis without obstruction    CHF (congestive heart failure) (HCC)    GERD  (gastroesophageal reflux disease)    Hypertension    Hypothyroidism    ILD (interstitial lung disease) (HCC) 02/08/2018   Interstitial lung disease (HCC)    Multinodular thyroid    benign FNA 08/2017.   Perimenopause 02/27/2019   Pulmonary edema    Scleroderma (HCC)    Status post laparoscopic cholecystectomy 02/13/20 02/13/2020   Vitamin D deficiency disease 02/27/2019    Past Surgical History:  Procedure Laterality Date   ABDOMINAL HERNIA REPAIR     BUNIONECTOMY Bilateral 10/2018   CHOLECYSTECTOMY N/A 02/13/2020   Procedure: LAPAROSCOPIC CHOLECYSTECTOMY;  Surgeon: Franky Macho, MD;  Location: [facility] ORS;  Service: General;  Laterality: N/A;   COLONOSCOPY WITH PROPOFOL N/A 01/02/2019   Procedure: COLONOSCOPY WITH PROPOFOL;  Surgeon: Corbin Ade, MD;  Location: [facility] ENDO SUITE;  Service: Endoscopy;  Laterality: N/A;  12:30pm   ESOPHAGOGASTRODUODENOSCOPY (EGD) WITH PROPOFOL N/A 01/28/2018   erosive reflux esophagitis, patulous EG Junction, no dilation, incomplete EGD due to retained food in stomach. GES thereafter with delayed gastric emptying.    RIGHT HEART CATH N/A 09/27/2017   Procedure: RIGHT HEART CATH;  Surgeon: Dolores Patty, MD;  Location: [facility] INVASIVE CV LAB;  Service: Cardiovascular;  Laterality: N/A;   RIGHT HEART CATH N/A 09/05/2019   Procedure: RIGHT HEART CATH;  Surgeon: Dolores Patty, MD;  Location: [facility] INVASIVE CV LAB;  Service: Cardiovascular;  Laterality: N/A;   RIGHT/LEFT HEART CATH AND CORONARY ANGIOGRAPHY N/A 08/18/2021   Procedure: RIGHT/LEFT HEART CATH AND CORONARY ANGIOGRAPHY;  Surgeon: Dolores Patty, MD;  Location: [facility] INVASIVE CV LAB;  Service: Cardiovascular;  Laterality: N/A;   UTERINE FIBROID SURGERY      Social History:  reports that she has never smoked. She has never used smokeless tobacco. She reports that she does not drink alcohol and does not use drugs.  Allergies:  Allergies  Allergen Reactions   Lisinopril Hives, Swelling and Other (See  Comments)   Tocilizumab Other (See Comments)    Swelling and shortness of breath sharpe pains in back and chest tightness.    (Not in a hospital admission)   Blood pressure 107/80, pulse 73, temperature 98 F (36.7 C), temperature source Oral, resp. rate 11, height 5\' 5"  (1.651 m), weight 61.8 kg, SpO2 92 %. Physical Exam: General: pleasant, WD, female who is laying in bed in NAD HEENT: head is normocephalic, atraumatic.  Sclera are noninjected.  Pupils equal and round. EOMs intact.  Ears and nose without any masses or lesions.  Mouth is pink and moist Heart: regular, rate, and rhythm.  Normal s1,s2. No obvious murmurs, gallops, or rubs noted.  Palpable radial and pedal pulses bilaterally Lungs: CTAB, no wheezes, rhonchi, or rales noted.  Respiratory effort nonlabored Abd: soft, mild generalized tto, moderately distended, +BS, no masses, hernias, or organomegaly.  NGT in place with bilious output MSK: all 4 extremities are symmetrical with no cyanosis, clubbing, or edema. Skin: warm and dry with no masses, lesions, or rashes Neuro: Cranial nerves 2-12 grossly intact, sensation is normal throughout Psych: A&Ox3 with an appropriate affect.    Results for orders placed or performed during the hospital encounter of 10/09/22 (from the past 48 hour(s))  Comprehensive metabolic panel     Status: Abnormal   Collection Time: 10/10/22 12:20 AM  Result Value Ref Range   Sodium 140 135 - 145 mmol/L   Potassium 2.9 (L) 3.5 - 5.1 mmol/L   Chloride 106 98 - 111 mmol/L   CO2 24 22 - 32 mmol/L   Glucose, Bld 92 70 - 99 mg/dL    Comment: Glucose reference range applies only to samples taken after fasting for at least 8 hours.   BUN 8 6 - 20 mg/dL   Creatinine, Ser 1.61 0.44 - 1.00 mg/dL   Calcium 09.6 8.9 - 04.5 mg/dL   Total Protein 6.9 6.5 - 8.1 g/dL   Albumin 3.6 3.5 - 5.0 g/dL   AST 17 15 - 41 U/L   ALT 12 0 - 44 U/L   Alkaline Phosphatase 77 38 - 126 U/L   Total Bilirubin 0.8 0.3 - 1.2  mg/dL   GFR, Estimated >40 >98 mL/min    Comment: (NOTE) Calculated using the CKD-EPI Creatinine Equation (2021)    Anion gap 10 5 - 15    Comment: Performed at [facility], [address], [place], [place] [address]  CBC with Differential     Status: Abnormal   Collection Time: 10/10/22 12:20 AM  Result Value Ref Range   WBC 7.2 4.0 - 10.5 K/uL   RBC 4.28 3.87 - 5.11 MIL/uL   Hemoglobin 11.7 (L) 12.0 - 15.0 g/dL   HCT 78.2 95.6 - 21.3 %   MCV 88.6 80.0 - 100.0 fL   MCH 27.3 26.0 - 34.0 pg   MCHC 30.9 30.0 - 36.0 g/dL   RDW 08.6 57.8 - 46.9 %   Platelets 212 150 - 400 K/uL   nRBC 0.0 0.0 - 0.2 %   Neutrophils Relative % 56 %   Neutro Abs 4.0 1.7 - 7.7 K/uL   Lymphocytes Relative 32 %  Lymphs Abs 2.3 0.7 - 4.0 K/uL   Monocytes Relative 9 %   Monocytes Absolute 0.6 0.1 - 1.0 K/uL   Eosinophils Relative 3 %   Eosinophils Absolute 0.2 0.0 - 0.5 K/uL   Basophils Relative 0 %   Basophils Absolute 0.0 0.0 - 0.1 K/uL   Immature Granulocytes 0 %   Abs Immature Granulocytes 0.02 0.00 - 0.07 K/uL    Comment: Performed at [facility], [address] [place], [place] [address]  Lipase, blood     Status: None   Collection Time: 10/10/22 12:20 AM  Result Value Ref Range   Lipase 22 11 - 51 U/L    Comment: Performed at [facility], [address] [place], [place] [address]  Urinalysis, Routine w reflex microscopic -Urine, Clean Catch     Status: Abnormal   Collection Time: 10/10/22  1:30 AM  Result Value Ref Range   Color, Urine YELLOW YELLOW   APPearance HAZY (A) CLEAR   Specific Gravity, Urine >1.046 (H) 1.005 - 1.030   pH 5.0 5.0 - 8.0   Glucose, UA NEGATIVE NEGATIVE mg/dL   Hgb urine dipstick SMALL (A) NEGATIVE   Bilirubin Urine NEGATIVE NEGATIVE   Ketones, ur NEGATIVE NEGATIVE mg/dL   Protein, ur 30 (A) NEGATIVE mg/dL   Nitrite POSITIVE (A) NEGATIVE   Leukocytes,Ua LARGE (A) NEGATIVE   RBC / HPF 11-20 0 - 5 RBC/hpf   WBC, UA >50 0 - 5 WBC/hpf    Bacteria, UA RARE (A) NONE SEEN   Squamous Epithelial / HPF 0-5 0 - 5 /HPF   Mucus PRESENT     Comment: Performed at [facility], [address] [place], [place] [address]  CBC with Differential/Platelet     Status: None   Collection Time: 10/10/22  2:20 AM  Result Value Ref Range   WBC 7.2 4.0 - 10.5 K/uL   RBC 4.41 3.87 - 5.11 MIL/uL   Hemoglobin 12.4 12.0 - 15.0 g/dL   HCT 91.4 78.2 - 95.6 %   MCV 90.9 80.0 - 100.0 fL   MCH 28.1 26.0 - 34.0 pg   MCHC 30.9 30.0 - 36.0 g/dL   RDW 21.3 08.6 - 57.8 %   Platelets 202 150 - 400 K/uL   nRBC 0.0 0.0 - 0.2 %   Neutrophils Relative % 50 %   Neutro Abs 3.7 1.7 - 7.7 K/uL   Lymphocytes Relative 37 %   Lymphs Abs 2.7 0.7 - 4.0 K/uL   Monocytes Relative 9 %   Monocytes Absolute 0.6 0.1 - 1.0 K/uL   Eosinophils Relative 3 %   Eosinophils Absolute 0.2 0.0 - 0.5 K/uL   Basophils Relative 1 %   Basophils Absolute 0.0 0.0 - 0.1 K/uL   Immature Granulocytes 0 %   Abs Immature Granulocytes 0.01 0.00 - 0.07 K/uL    Comment: Performed at [facility], [address], [place], [place] [address]  Comprehensive metabolic panel     Status: Abnormal   Collection Time: 10/10/22  2:20 AM  Result Value Ref Range   Sodium 141 135 - 145 mmol/L   Potassium 3.1 (L) 3.5 - 5.1 mmol/L   Chloride 106 98 - 111 mmol/L   CO2 24 22 - 32 mmol/L   Glucose, Bld 95 70 - 99 mg/dL    Comment: Glucose reference range applies only to samples taken after fasting for at least 8 hours.   BUN 8 6 - 20  mg/dL   Creatinine, Ser 1.61 0.44 - 1.00 mg/dL   Calcium 9.7 8.9 - 09.6 mg/dL   Total Protein 6.9 6.5 - 8.1 g/dL   Albumin 3.5 3.5 - 5.0 g/dL   AST 17 15 - 41 U/L   ALT 12 0 - 44 U/L   Alkaline Phosphatase 76 38 - 126 U/L   Total Bilirubin 0.9 0.3 - 1.2 mg/dL   GFR, Estimated >04 >54 mL/min    Comment: (NOTE) Calculated using the CKD-EPI Creatinine Equation (2021)    Anion gap 11 5 - 15    Comment: Performed at [facility], [address]  [place], [place] [address]  Magnesium     Status: Abnormal   Collection Time: 10/10/22  2:20 AM  Result Value Ref Range   Magnesium 1.4 (L) 1.7 - 2.4 mg/dL    Comment: Performed at [facility], [address], [place], [place] [address]  CBC     Status: Abnormal   Collection Time: 10/10/22  8:17 AM  Result Value Ref Range   WBC 7.6 4.0 - 10.5 K/uL   RBC 4.11 3.87 - 5.11 MIL/uL   Hemoglobin 11.3 (L) 12.0 - 15.0 g/dL   HCT 29.5 62.1 - 30.8 %   MCV 90.5 80.0 - 100.0 fL   MCH 27.5 26.0 - 34.0 pg   MCHC 30.4 30.0 - 36.0 g/dL   RDW 65.7 84.6 - 96.2 %   Platelets 211 150 - 400 K/uL   nRBC 0.0 0.0 - 0.2 %    Comment: Performed at [facility], [address] [place], [place] [address]   DG Chest Portable 1 View  Result Date: 10/10/2022 CLINICAL DATA:  Check gastric catheter placement EXAM: PORTABLE CHEST 1 VIEW COMPARISON:  None Available. FINDINGS: Gastric catheter extends into the stomach. Cardiac shadow is prominent but accentuated by the portable technique. Bibasilar atelectatic changes are noted. This may be related to the poor inspiratory effort. IMPRESSION: Gastric catheter within the stomach. Bibasilar atelectasis likely related to a poor inspiratory effort. Electronically Signed   By: Alcide Clever M.D.   On: 10/10/2022 02:27   CT Abdomen Pelvis W Contrast  Result Date: 10/09/2022 CLINICAL DATA:  Abdominal pain EXAM: CT ABDOMEN AND PELVIS WITH CONTRAST TECHNIQUE: Multidetector CT imaging of the abdomen and pelvis was performed using the standard protocol following bolus administration of intravenous contrast. RADIATION DOSE REDUCTION: This exam was performed according to the departmental dose-optimization program which includes automated exposure control, adjustment of the mA and/or kV according to patient size and/or use of iterative reconstruction technique. CONTRAST:  OMNIPAQUE IOHEXOL 300 MG/ML  SOLN COMPARISON:  06/06/2022 FINDINGS: Lower chest: Mild subpleural fibrotic  changes are noted. Hepatobiliary: No focal liver abnormality is seen. Status post cholecystectomy. No biliary dilatation. Pancreas: Unremarkable. No pancreatic ductal dilatation or surrounding inflammatory changes. Spleen: Normal in size without focal abnormality. Adrenals/Urinary Tract: Adrenal glands are within normal limits. Kidneys demonstrate a normal enhancement pattern bilaterally. No obstructive changes are seen. Bilateral renal calculi are noted measuring up to 3 mm on the left. No ureteral stones are noted. The bladder is decompressed. Stomach/Bowel: Colon is predominately decompressed. The appendix is within normal limits. Stomach is decompressed. Multiple dilated fluid-filled loops of small bowel are noted. Transition point appears to lie within the mid abdomen anteriorly and is likely related to adhesions. Distal small bowel appears within normal limits. Vascular/Lymphatic: Aortic atherosclerosis. No enlarged abdominal or pelvic lymph nodes. Reproductive: Uterine fibroids are  noted largest of which measures up to 7.7 cm. No adnexal mass is seen. Other: No significant free fluid is noted.  No free air is seen. Musculoskeletal: No acute bony abnormality is noted. IMPRESSION: Changes consistent with partial small bowel obstruction. Transition point appears to lie in the anterior abdomen just to the left of the midline. This is likely related to adhesions. Remainder of the exam is stable with chronic findings of uterine fibroids and nonobstructing renal calculi. Aortic Atherosclerosis (ICD10-I70.0). Electronically Signed   By: Alcide Clever M.D.   On: 10/09/2022 19:20      Assessment/Plan SBO Likely adhesive with history of prior abdominal surgeries and scleroderma  - CT w/ Changes consistent with partial small bowel obstruction. Transition point appears to lie in the anterior abdomen just to the left of the midline. This is likely related to adhesions. - No current indication for emergency  surgery - Continue NGT to LIWS for now.  Output not recorded. - Start SBO protocol - Keep K > 4 and Mg > 2 for bowel function - Mobilize for bowel function - Hopefully patient will improve with conservative management. If patient fails to improve with conservative management, she may require exploratory surgery during admission - Agree with medical admission. We will follow with you.   FEN: NPO/NGT LIWS, IVF per primary ID: Rocephin (UTI) VTE: Lovenox  Per primary: UTI Hypokalemia Scleroderma Hypertension Pulmonary hypertension Hypothyroidism GERD CHF Esophageal dysphagia  I reviewed ED provider notes, hospitalist notes, last 24 h vitals and pain scores, last 48 h intake and output, last 24 h labs and trends, and last 24 h imaging results.   Trixie Deis, [facility] 10/10/2022, 8:48 AM Please see Amion for pager number during day hours 7:00am-4:30pm

## 2022-10-10 NOTE — Progress Notes (Signed)
  Carryover admission to the Day Admitter.  I discussed this case with the EDP, Berle Mull, PA.  Per these discussions:   This is a 52 year old female with scleroderma, previous abdominal surgeries, small bowel obstruction, who is being admitted with new Szo obstruction after presenting with 2 days of abdominal discomfort associate with nausea/vomiting.  She had contacted her PCP with the symptoms, who is able to arrange for an outpatient CT abdomen/pelvis which suddenly demonstrated small bowel obstruction.  She is also been complaining of some recent increase in urinary frequency.  Presenting labs notable for potassium 2.9 for which she is receiving IV potassium chloride, with serum magnesium level added on and pending at this time.  Additionally, urinalysis reportedly consistent with urinary tract infection, for which a urine culture was added on, and for which the patient received a single dose of Rocephin.  NGT has been placed.  I have placed an order for inpatient admission to med/tele for further evaluation management of the above.  I have placed some additional preliminary admit orders via the adult multi-morbid admission order set. I have also ordered n.p.o., gentle lactated Ringer's at 75 cc/h x 8 hours.  Prn Dilaudid, prn IV Zofran.  Also ordered morning labs in the form of CMP, CBC and serum magnesium level.  I will defer to the admitting hospitalist decision making regarding additional antibiotics for potential UTI.  Of note, case has not been discussed with general surgery at this time.    Newton Pigg, DO Hospitalist

## 2022-10-10 NOTE — ED Provider Notes (Signed)
[facility] AT [facility] Provider Note   CSN: [id number] Arrival date & time: 10/09/22  2034     History  Chief Complaint  Patient presents with   Abdominal Pain    Sent from OSH for SBO     Karen [name] is a 52 y.o. female.  HPI   Patient with medical history including scleroderma currently on CellCept of GERD, CHF, hypothyroidism, hypertension, presenting with complaints of abdominal pain nausea vomiting.  Patient states that she was seen at her primary doctor she was told that she needs to be admitted.  Patient states that over the last 2 days she has been unable to tolerate p.o. has been having consistent nausea and vomiting no bloody emesis or coffee-ground emesis she states she still passing stool but very small amount states that last time was yesterday morning, no bloody stools or dark tarry stools she states it was more consistent with diarrhea.  She denies any urinary symptoms.  No fevers chills cough congestion general body aches.  I have reviewed patient's chart was seen by primary care doctor patient CT ab pelvis was obtained and reveals partial small bowel obstruction with transition point l anterior abdomen left to the midline likely caused by a adhesion patient's had uterine fibroids removal, hernia repair, cholecystectomy.  Patient had this past and resolved spontaneously on its own.  Home Medications Prior to Admission medications   Medication Sig Start Date End Date Taking? Authorizing Provider  acetaminophen (TYLENOL) 325 MG tablet Take 2 tablets (650 mg total) by mouth every 6 (six) hours as needed for mild pain, fever or headache (or Fever >/= 101). 02/15/20   Shon Hale, MD  amLODipine (NORVASC) 10 MG tablet Take 1 tablet (10 mg total) by mouth daily. 08/17/22   Anabel Halon, MD  benzonatate (TESSALON) 100 MG capsule Take 1 capsule (100 mg total) by mouth 2 (two) times daily as needed for cough. 04/13/22   Anabel Halon,  MD  bisoprolol (ZEBETA) 5 MG tablet Take 1 tablet (5 mg total) by mouth daily. 08/24/22   Bensimhon, Bevelyn Buckles, MD  clindamycin-benzoyl peroxide (BENZACLIN) gel Apply 1 Application topically as needed.    [provider]  clobetasol cream (TEMOVATE) 0.05 % Apply 1 Application topically as needed.    [provider]  esomeprazole (NEXIUM) 40 MG capsule Take 1 capsule (40 mg total) by mouth 2 (two) times daily. 06/05/22   Aida Raider, NP  Fluocinolone Acetonide Scalp 0.01 % OIL Apply to affected frontal scalp 4 (four) times a week. Not to face 07/27/22     furosemide (LASIX) 40 MG tablet Take 1 tablet (40 mg total) by mouth 3 (three) times a week. Paulo Fruit, Fri 08/25/22   Bensimhon, Bevelyn Buckles, MD  macitentan (OPSUMIT) 10 MG tablet Take 1 tablet (10 mg total) by mouth daily. 03/24/22   Bensimhon, Bevelyn Buckles, MD  meloxicam (MOBIC) 15 MG tablet Take 1 tablet (15 mg total) by mouth daily as needed. 07/17/22     Multiple Vitamins-Minerals (HM MULTIVITAMIN ADULT GUMMY PO) Take 2 tablets by mouth daily.    [provider]  mycophenolate (CELLCEPT) 500 MG tablet Take 3 tablets (1,500 mg total) by mouth every morning AND 3 tablets (1,500 mg total) every evening. 01/18/22   Mannam, Colbert Coyer, MD  Nintedanib (OFEV) 150 MG CAPS Take 1 capsule (150 mg total) by mouth 2 (two) times daily. 08/28/22   Quentin Angst, MD  NP THYROID 120 MG  tablet Take 1 tablet (120 mg total) by mouth daily before breakfast. 08/18/22   Anabel Halon, MD  ondansetron (ZOFRAN) 4 MG tablet Take 1 tablet (4 mg total) by mouth every 8 (eight) hours as needed for nausea or vomiting. 10/09/22   Anabel Halon, MD  potassium chloride 20 MEQ/15ML (10%) SOLN Take 15 mLs (20 mEq total) by mouth daily with furosemide 08/17/22   Anabel Halon, MD  spironolactone (ALDACTONE) 25 MG tablet Take 1 tablet (25 mg total) by mouth 2 (two) times daily. 08/17/22   Anabel Halon, MD  triamcinolone cream (KENALOG) 0.1 % Apply 1  Application topically daily as needed.    [provider]  PARoxetine (PAXIL) 10 MG tablet Take 1 tablet (10 mg total) by mouth daily as directed 10/07/20 12/02/20        Allergies    Lisinopril and Tocilizumab    Review of Systems   Review of Systems  Constitutional:  Negative for chills and fever.  Respiratory:  Negative for shortness of breath.   Cardiovascular:  Negative for chest pain.  Gastrointestinal:  Positive for abdominal pain, nausea and vomiting.  Neurological:  Negative for headaches.    Physical Exam Updated Vital Signs BP (!) 162/108   Pulse 94   Temp 98.8 F (37.1 C) (Oral)   Resp 19   Ht 5\' 5"  (1.651 m)   Wt 61.8 kg   SpO2 98%   BMI 22.67 kg/m  Physical Exam Vitals and nursing note reviewed.  Constitutional:      General: She is not in acute distress.    Appearance: She is not ill-appearing.  HENT:     Head: Normocephalic and atraumatic.     Nose: No congestion.  Eyes:     Conjunctiva/sclera: Conjunctivae normal.  Cardiovascular:     Rate and Rhythm: Normal rate and regular rhythm.     Pulses: Normal pulses.     Heart sounds: No murmur heard.    No friction rub. No gallop.  Pulmonary:     Effort: No respiratory distress.     Breath sounds: No wheezing, rhonchi or rales.  Abdominal:     General: There is distension.     Palpations: Abdomen is soft.     Tenderness: There is abdominal tenderness. There is no right CVA tenderness or left CVA tenderness.     Comments: Abdomen slightly distended, tympanic to percussion, notable tenderness in the suprapubic region without guarding or rebound is or peritoneal sign.  Skin:    General: Skin is warm and dry.  Neurological:     Mental Status: She is alert.  Psychiatric:        Mood and Affect: Mood normal.     ED Results / Procedures / Treatments   Labs (all labs ordered are listed, but only abnormal results are displayed) Labs Reviewed  COMPREHENSIVE METABOLIC PANEL - Abnormal; Notable for  the following components:      Result Value   Potassium 2.9 (*)    All other components within normal limits  CBC WITH DIFFERENTIAL/PLATELET - Abnormal; Notable for the following components:   Hemoglobin 11.7 (*)    All other components within normal limits  URINALYSIS, ROUTINE W REFLEX MICROSCOPIC - Abnormal; Notable for the following components:   APPearance HAZY (*)    Specific Gravity, Urine >1.046 (*)    Hgb urine dipstick SMALL (*)    Protein, ur 30 (*)    Nitrite POSITIVE (*)    Leukocytes,Ua  LARGE (*)    Bacteria, UA RARE (*)    All other components within normal limits  URINE CULTURE  LIPASE, BLOOD  MAGNESIUM    EKG None  Radiology CT Abdomen Pelvis W Contrast  Result Date: 10/09/2022 CLINICAL DATA:  Abdominal pain EXAM: CT ABDOMEN AND PELVIS WITH CONTRAST TECHNIQUE: Multidetector CT imaging of the abdomen and pelvis was performed using the standard protocol following bolus administration of intravenous contrast. RADIATION DOSE REDUCTION: This exam was performed according to the departmental dose-optimization program which includes automated exposure control, adjustment of the mA and/or kV according to patient size and/or use of iterative reconstruction technique. CONTRAST:  OMNIPAQUE IOHEXOL 300 MG/ML  SOLN COMPARISON:  06/06/2022 FINDINGS: Lower chest: Mild subpleural fibrotic changes are noted. Hepatobiliary: No focal liver abnormality is seen. Status post cholecystectomy. No biliary dilatation. Pancreas: Unremarkable. No pancreatic ductal dilatation or surrounding inflammatory changes. Spleen: Normal in size without focal abnormality. Adrenals/Urinary Tract: Adrenal glands are within normal limits. Kidneys demonstrate a normal enhancement pattern bilaterally. No obstructive changes are seen. Bilateral renal calculi are noted measuring up to 3 mm on the left. No ureteral stones are noted. The bladder is decompressed. Stomach/Bowel: Colon is predominately decompressed. The  appendix is within normal limits. Stomach is decompressed. Multiple dilated fluid-filled loops of small bowel are noted. Transition point appears to lie within the mid abdomen anteriorly and is likely related to adhesions. Distal small bowel appears within normal limits. Vascular/Lymphatic: Aortic atherosclerosis. No enlarged abdominal or pelvic lymph nodes. Reproductive: Uterine fibroids are noted largest of which measures up to 7.7 cm. No adnexal mass is seen. Other: No significant free fluid is noted.  No free air is seen. Musculoskeletal: No acute bony abnormality is noted. IMPRESSION: Changes consistent with partial small bowel obstruction. Transition point appears to lie in the anterior abdomen just to the left of the midline. This is likely related to adhesions. Remainder of the exam is stable with chronic findings of uterine fibroids and nonobstructing renal calculi. Aortic Atherosclerosis (ICD10-I70.0). Electronically Signed   By: Alcide Clever M.D.   On: 10/09/2022 19:20    Procedures Procedures    Medications Ordered in ED Medications  potassium chloride 10 mEq in 100 mL IVPB (has no administration in time range)  cefTRIAXone (ROCEPHIN) 1 g in sodium chloride 0.9 % 100 mL IVPB (1 g Intravenous New Bag/Given 10/10/22 0207)  ondansetron (ZOFRAN) injection 4 mg (4 mg Intravenous Given 10/10/22 0025)  sodium chloride 0.9 % bolus 1,000 mL (1,000 mLs Intravenous New Bag/Given 10/10/22 0026)  HYDROmorphone (DILAUDID) injection 0.5 mg (0.5 mg Intravenous Given 10/10/22 0204)    ED Course/ Medical Decision Making/ A&P                             Medical Decision Making Amount and/or Complexity of Data Reviewed Labs: ordered.  Risk Prescription drug management. Decision regarding hospitalization.   This patient presents to the ED for concern of nausea vomiting, this involves an extensive number of treatment options, and is a complaint that carries with it a high risk of complications and  morbidity.  The differential diagnosis includes bowel obstruction, volvulus, malignancy, diverticulitis    Additional history obtained:  Additional history obtained from N/A External records from outside source obtained and reviewed including primary care notes   Co morbidities that complicate the patient evaluation  Scleroderma  Social Determinants of Health:  N/A    Lab Tests:  I Ordered,  and personally interpreted labs.  The pertinent results include: CBC shows normocytic anemia hemoglobin 9.7, CMP reveals potassium 2.9, lipase 22,   Imaging Studies ordered:  I ordered imaging studies including N/A I independently visualized and interpreted imaging which showed N/A I agree with the radiologist interpretation   Cardiac Monitoring:  The patient was maintained on a cardiac monitor.  I personally viewed and interpreted the cardiac monitored which showed an underlying rhythm of: N/A   Medicines ordered and prescription drug management:  I ordered medication including antiemetics, analgesics, I have reviewed the patients home medicines and have made adjustments as needed  Critical Interventions:  N/A   Reevaluation:  Presents with abdominal pain, reviewed patient's note has bowel obstruction, this is consistent with my examination, will obtain screening lab workup, placed on NG tube.  Lab work shows hypokalemia, will add on magnesium prior with intravenous potassium, UA concerning for possible UTI patient does endorse urinary frequency, will culture and start antibiotics.  Admit to medicine patient is in agreement this plan.  Consultations Obtained:  I requested consultation with the howerter,  and discussed lab and imaging findings as well as pertinent plan - they recommend: Will admit the patient    Test Considered:  N/A    Rule out low suspicion for lower lobe pneumonia as lung sounds are clear bilaterally, will defer imaging at this time.  I have low  suspicion for liver or gallbladder abnormality as she has no right upper quadrant tenderness, liver enzymes, alk phos, T bili all within normal limits.  Low suspicion for pancreatitis as lipase is within normal limits.  Low suspicion for ruptured stomach ulcer as she has no peritoneal sign present on exam.  Suspicion for volvulus, diverticulitis, malignancy causing obstruction, pyelo-, kidney stone as well as time and CT imaging all negative these findings.    Dispostion and problem list  After consideration of the diagnostic results and the patients response to treatment, I feel that the patent would benefit from admission.  Bowel obstruction-likely due to adhesions, currently on NG tube, will need bowel rest, antiemetics, and likely formal evaluation by general surgery they are not consulted as this is nonemergent obstruction and can be seen in the morning. UTI-started on antibiotics will need further observation and de-escalation to oral medicine.            Final Clinical Impression(s) / ED Diagnoses Final diagnoses:  Partial intestinal obstruction, unspecified cause (HCC)  Acute cystitis with hematuria    Rx / DC Orders ED Discharge Orders     None         Carroll Sage, PA-C 10/10/22 0209    Gilda Crease, MD 10/10/22 [phone]

## 2022-10-10 NOTE — ED Notes (Signed)
ED TO INPATIENT HANDOFF REPORT  ED Nurse Name and Phone #: [phone]  S Name/Age/Gender Karen [name] 52 y.o. female Room/Bed: 002C/002C  Code Status   Code Status: Full Code  Home/SNF/Other Home Patient oriented to: self, place, time, and situation Is this baseline? Yes   Triage Complete: Triage complete  Chief Complaint SBO (small bowel obstruction) (HCC) [K56.609]  Triage Note Pt was sent to [facility] from PCP to get CT for NVD. PT received ct at [facility]. Then was told by [facility] to come to [facility] to be admitted. Pt does not know diagnosis or anythign. She was just told to come here  and be admitted bc of CT results.   Allergies Allergies  Allergen Reactions   Lisinopril Hives, Swelling and Other (See Comments)   Tocilizumab Other (See Comments)    Swelling and shortness of breath sharpe pains in back and chest tightness.    Level of Care/Admitting Diagnosis ED Disposition     ED Disposition  Admit   Condition  --   Comment  Hospital Area: [facility] [[id number]]  Level of Care: Telemetry Medical [[id number]]  May admit patient to [facility] or [facility] if equivalent level of care is available:: No  Covid Evaluation: Asymptomatic - no recent exposure (last 10 days) testing not required  Diagnosis: SBO (small bowel obstruction) [facility]) [[id number]]  Admitting Physician: Angie Fava [[id number]]  Attending Physician: Angie Fava [[id number]]  Certification:: I certify this patient will need inpatient services for at least 2 midnights  Estimated Length of Stay: 2          B Medical/Surgery History Past Medical History:  Diagnosis Date   Acute respiratory failure with hypoxia due to flash pulmonary edema requiring intubation 02/13/2020   Calculus of gallbladder with acute cholecystitis without obstruction    CHF (congestive heart failure) (HCC)    GERD (gastroesophageal reflux disease)    Hypertension    Hypothyroidism    ILD (interstitial  lung disease) (HCC) 02/08/2018   Interstitial lung disease (HCC)    Multinodular thyroid    benign FNA 08/2017.   Perimenopause 02/27/2019   Pulmonary edema    Scleroderma (HCC)    Status post laparoscopic cholecystectomy 02/13/20 02/13/2020   Vitamin D deficiency disease 02/27/2019   Past Surgical History:  Procedure Laterality Date   ABDOMINAL HERNIA REPAIR     BUNIONECTOMY Bilateral 10/2018   CHOLECYSTECTOMY N/A 02/13/2020   Procedure: LAPAROSCOPIC CHOLECYSTECTOMY;  Surgeon: Franky Macho, MD;  Location: [facility] ORS;  Service: General;  Laterality: N/A;   COLONOSCOPY WITH PROPOFOL N/A 01/02/2019   Procedure: COLONOSCOPY WITH PROPOFOL;  Surgeon: Corbin Ade, MD;  Location: [facility] ENDO SUITE;  Service: Endoscopy;  Laterality: N/A;  12:30pm   ESOPHAGOGASTRODUODENOSCOPY (EGD) WITH PROPOFOL N/A 01/28/2018   erosive reflux esophagitis, patulous EG Junction, no dilation, incomplete EGD due to retained food in stomach. GES thereafter with delayed gastric emptying.    RIGHT HEART CATH N/A 09/27/2017   Procedure: RIGHT HEART CATH;  Surgeon: Dolores Patty, MD;  Location: [facility] INVASIVE CV LAB;  Service: Cardiovascular;  Laterality: N/A;   RIGHT HEART CATH N/A 09/05/2019   Procedure: RIGHT HEART CATH;  Surgeon: Dolores Patty, MD;  Location: [facility] INVASIVE CV LAB;  Service: Cardiovascular;  Laterality: N/A;   RIGHT/LEFT HEART CATH AND CORONARY ANGIOGRAPHY N/A 08/18/2021   Procedure: RIGHT/LEFT HEART CATH AND CORONARY ANGIOGRAPHY;  Surgeon: Dolores Patty, MD;  Location: [facility] INVASIVE CV LAB;  Service: Cardiovascular;  Laterality: N/A;   UTERINE FIBROID SURGERY       A IV Location/Drains/Wounds Patient Lines/Drains/Airways Status     Active Line/Drains/Airways     Name Placement date Placement time Site Days   Peripheral IV 10/10/22 22 G Posterior;Right Hand 10/10/22  0000  Hand  less than 1   NG/OG Vented/Dual Lumen 16 Fr. Right nare Marking at nare/corner of mouth 46 cm 10/10/22  0204  Right  nare  less than 1   Incision - 4 Ports Abdomen 1: Umbilicus 2: Mid;Upper 3: Right;Upper 4: Right;Lateral;Mid 02/13/20  1030  -- 970            Intake/Output Last 24 hours  Intake/Output Summary (Last 24 hours) at 10/10/2022 0859 Last data filed at 10/10/2022 1610 Gross per 24 hour  Intake 705.12 ml  Output --  Net 705.12 ml    Labs/Imaging Results for orders placed or performed during the hospital encounter of 10/09/22 (from the past 48 hour(s))  Comprehensive metabolic panel     Status: Abnormal   Collection Time: 10/10/22 12:20 AM  Result Value Ref Range   Sodium 140 135 - 145 mmol/L   Potassium 2.9 (L) 3.5 - 5.1 mmol/L   Chloride 106 98 - 111 mmol/L   CO2 24 22 - 32 mmol/L   Glucose, Bld 92 70 - 99 mg/dL    Comment: Glucose reference range applies only to samples taken after fasting for at least 8 hours.   BUN 8 6 - 20 mg/dL   Creatinine, Ser 9.60 0.44 - 1.00 mg/dL   Calcium 45.4 8.9 - 09.8 mg/dL   Total Protein 6.9 6.5 - 8.1 g/dL   Albumin 3.6 3.5 - 5.0 g/dL   AST 17 15 - 41 U/L   ALT 12 0 - 44 U/L   Alkaline Phosphatase 77 38 - 126 U/L   Total Bilirubin 0.8 0.3 - 1.2 mg/dL   GFR, Estimated >11 >91 mL/min    Comment: (NOTE) Calculated using the CKD-EPI Creatinine Equation (2021)    Anion gap 10 5 - 15    Comment: Performed at [facility], [address], [place], [place] [address]  CBC with Differential     Status: Abnormal   Collection Time: 10/10/22 12:20 AM  Result Value Ref Range   WBC 7.2 4.0 - 10.5 K/uL   RBC 4.28 3.87 - 5.11 MIL/uL   Hemoglobin 11.7 (L) 12.0 - 15.0 g/dL   HCT 56.2 13.0 - 86.5 %   MCV 88.6 80.0 - 100.0 fL   MCH 27.3 26.0 - 34.0 pg   MCHC 30.9 30.0 - 36.0 g/dL   RDW 78.4 69.6 - 29.5 %   Platelets 212 150 - 400 K/uL   nRBC 0.0 0.0 - 0.2 %   Neutrophils Relative % 56 %   Neutro Abs 4.0 1.7 - 7.7 K/uL   Lymphocytes Relative 32 %   Lymphs Abs 2.3 0.7 - 4.0 K/uL   Monocytes Relative 9 %   Monocytes Absolute 0.6 0.1 - 1.0 K/uL    Eosinophils Relative 3 %   Eosinophils Absolute 0.2 0.0 - 0.5 K/uL   Basophils Relative 0 %   Basophils Absolute 0.0 0.0 - 0.1 K/uL   Immature Granulocytes 0 %   Abs Immature Granulocytes 0.02 0.00 - 0.07 K/uL    Comment: Performed at [facility], [address] [place], [place] [address]  Lipase, blood     Status: None   Collection Time: 10/10/22 12:20  AM  Result Value Ref Range   Lipase 22 11 - 51 U/L    Comment: Performed at [facility], [address] [place], [place] [address]  Urinalysis, Routine w reflex microscopic -Urine, Clean Catch     Status: Abnormal   Collection Time: 10/10/22  1:30 AM  Result Value Ref Range   Color, Urine YELLOW YELLOW   APPearance HAZY (A) CLEAR   Specific Gravity, Urine >1.046 (H) 1.005 - 1.030   pH 5.0 5.0 - 8.0   Glucose, UA NEGATIVE NEGATIVE mg/dL   Hgb urine dipstick SMALL (A) NEGATIVE   Bilirubin Urine NEGATIVE NEGATIVE   Ketones, ur NEGATIVE NEGATIVE mg/dL   Protein, ur 30 (A) NEGATIVE mg/dL   Nitrite POSITIVE (A) NEGATIVE   Leukocytes,Ua LARGE (A) NEGATIVE   RBC / HPF 11-20 0 - 5 RBC/hpf   WBC, UA >50 0 - 5 WBC/hpf   Bacteria, UA RARE (A) NONE SEEN   Squamous Epithelial / HPF 0-5 0 - 5 /HPF   Mucus PRESENT     Comment: Performed at [facility], [address], [place], [place] [address]  CBC with Differential/Platelet     Status: None   Collection Time: 10/10/22  2:20 AM  Result Value Ref Range   WBC 7.2 4.0 - 10.5 K/uL   RBC 4.41 3.87 - 5.11 MIL/uL   Hemoglobin 12.4 12.0 - 15.0 g/dL   HCT 09.8 11.9 - 14.7 %   MCV 90.9 80.0 - 100.0 fL   MCH 28.1 26.0 - 34.0 pg   MCHC 30.9 30.0 - 36.0 g/dL   RDW 82.9 56.2 - 13.0 %   Platelets 202 150 - 400 K/uL   nRBC 0.0 0.0 - 0.2 %   Neutrophils Relative % 50 %   Neutro Abs 3.7 1.7 - 7.7 K/uL   Lymphocytes Relative 37 %   Lymphs Abs 2.7 0.7 - 4.0 K/uL   Monocytes Relative 9 %   Monocytes Absolute 0.6 0.1 - 1.0 K/uL   Eosinophils Relative 3 %   Eosinophils Absolute  0.2 0.0 - 0.5 K/uL   Basophils Relative 1 %   Basophils Absolute 0.0 0.0 - 0.1 K/uL   Immature Granulocytes 0 %   Abs Immature Granulocytes 0.01 0.00 - 0.07 K/uL    Comment: Performed at [facility], [address] [place], [place] [address]  Comprehensive metabolic panel     Status: Abnormal   Collection Time: 10/10/22  2:20 AM  Result Value Ref Range   Sodium 141 135 - 145 mmol/L   Potassium 3.1 (L) 3.5 - 5.1 mmol/L   Chloride 106 98 - 111 mmol/L   CO2 24 22 - 32 mmol/L   Glucose, Bld 95 70 - 99 mg/dL    Comment: Glucose reference range applies only to samples taken after fasting for at least 8 hours.   BUN 8 6 - 20 mg/dL   Creatinine, Ser 4.69 0.44 - 1.00 mg/dL   Calcium 9.7 8.9 - 62.9 mg/dL   Total Protein 6.9 6.5 - 8.1 g/dL   Albumin 3.5 3.5 - 5.0 g/dL   AST 17 15 - 41 U/L   ALT 12 0 - 44 U/L   Alkaline Phosphatase 76 38 - 126 U/L   Total Bilirubin 0.9 0.3 - 1.2 mg/dL   GFR, Estimated >52 >84 mL/min    Comment: (NOTE) Calculated using the CKD-EPI Creatinine Equation (2021)    Anion gap 11 5 - 15  Comment: Performed at [facility], [address] [place], [place] [address]  Magnesium     Status: Abnormal   Collection Time: 10/10/22  2:20 AM  Result Value Ref Range   Magnesium 1.4 (L) 1.7 - 2.4 mg/dL    Comment: Performed at [facility], [address] [place], [place] [address]  CBC     Status: Abnormal   Collection Time: 10/10/22  8:17 AM  Result Value Ref Range   WBC 7.6 4.0 - 10.5 K/uL   RBC 4.11 3.87 - 5.11 MIL/uL   Hemoglobin 11.3 (L) 12.0 - 15.0 g/dL   HCT 09.8 11.9 - 14.7 %   MCV 90.5 80.0 - 100.0 fL   MCH 27.5 26.0 - 34.0 pg   MCHC 30.4 30.0 - 36.0 g/dL   RDW 82.9 56.2 - 13.0 %   Platelets 211 150 - 400 K/uL   nRBC 0.0 0.0 - 0.2 %    Comment: Performed at [facility], [address], [place], [place] [address]   DG Chest Portable 1 View  Result Date: 10/10/2022 CLINICAL DATA:  Check gastric catheter placement EXAM:  PORTABLE CHEST 1 VIEW COMPARISON:  None Available. FINDINGS: Gastric catheter extends into the stomach. Cardiac shadow is prominent but accentuated by the portable technique. Bibasilar atelectatic changes are noted. This may be related to the poor inspiratory effort. IMPRESSION: Gastric catheter within the stomach. Bibasilar atelectasis likely related to a poor inspiratory effort. Electronically Signed   By: Alcide Clever M.D.   On: 10/10/2022 02:27   CT Abdomen Pelvis W Contrast  Result Date: 10/09/2022 CLINICAL DATA:  Abdominal pain EXAM: CT ABDOMEN AND PELVIS WITH CONTRAST TECHNIQUE: Multidetector CT imaging of the abdomen and pelvis was performed using the standard protocol following bolus administration of intravenous contrast. RADIATION DOSE REDUCTION: This exam was performed according to the departmental dose-optimization program which includes automated exposure control, adjustment of the mA and/or kV according to patient size and/or use of iterative reconstruction technique. CONTRAST:  OMNIPAQUE IOHEXOL 300 MG/ML  SOLN COMPARISON:  06/06/2022 FINDINGS: Lower chest: Mild subpleural fibrotic changes are noted. Hepatobiliary: No focal liver abnormality is seen. Status post cholecystectomy. No biliary dilatation. Pancreas: Unremarkable. No pancreatic ductal dilatation or surrounding inflammatory changes. Spleen: Normal in size without focal abnormality. Adrenals/Urinary Tract: Adrenal glands are within normal limits. Kidneys demonstrate a normal enhancement pattern bilaterally. No obstructive changes are seen. Bilateral renal calculi are noted measuring up to 3 mm on the left. No ureteral stones are noted. The bladder is decompressed. Stomach/Bowel: Colon is predominately decompressed. The appendix is within normal limits. Stomach is decompressed. Multiple dilated fluid-filled loops of small bowel are noted. Transition point appears to lie within the mid abdomen anteriorly and is likely related to  adhesions. Distal small bowel appears within normal limits. Vascular/Lymphatic: Aortic atherosclerosis. No enlarged abdominal or pelvic lymph nodes. Reproductive: Uterine fibroids are noted largest of which measures up to 7.7 cm. No adnexal mass is seen. Other: No significant free fluid is noted.  No free air is seen. Musculoskeletal: No acute bony abnormality is noted. IMPRESSION: Changes consistent with partial small bowel obstruction. Transition point appears to lie in the anterior abdomen just to the left of the midline. This is likely related to adhesions. Remainder of the exam is stable with chronic findings of uterine fibroids and nonobstructing renal calculi. Aortic Atherosclerosis (ICD10-I70.0). Electronically Signed   By: Alcide Clever M.D.   On: 10/09/2022 19:20  Pending Labs Unresulted Labs (From admission, onward)     Start     Ordered   10/17/22 0500  Creatinine, serum  (enoxaparin (LOVENOX)    CrCl >/= 30 ml/min)  Weekly,   R     Comments: while on enoxaparin therapy    10/10/22 0655   10/11/22 0500  Basic metabolic panel  Tomorrow morning,   R        10/10/22 0655   10/10/22 0655  Creatinine, serum  (enoxaparin (LOVENOX)    CrCl >/= 30 ml/min)  Once,   R       Comments: Baseline for enoxaparin therapy IF NOT ALREADY DRAWN.    10/10/22 0655   10/10/22 0159  Urine Culture  Once,   URGENT       Question:  Indication  Answer:  Dysuria   10/10/22 0158            Vitals/Pain Today's Vitals   10/10/22 0630 10/10/22 0700 10/10/22 0730 10/10/22 0800  BP: 107/78 (!) 119/93 110/85 107/80  Pulse: 77 78 77 73  Resp: 13 16 13 11   Temp:      TempSrc:      SpO2: 93% 95% 91% 92%  Weight:      Height:      PainSc:        Isolation Precautions No active isolations  Medications Medications  acetaminophen (TYLENOL) tablet 650 mg (has no administration in time range)    Or  acetaminophen (TYLENOL) suppository 650 mg (has no administration in time range)  ondansetron  (ZOFRAN) injection 4 mg (has no administration in time range)  naloxone (NARCAN) injection 0.4 mg (has no administration in time range)  HYDROmorphone (DILAUDID) injection 0.5 mg (0.5 mg Intravenous Given 10/10/22 0525)  lactated ringers infusion ( Intravenous Rate/Dose Change 10/10/22 0652)  cefTRIAXone (ROCEPHIN) 1 g in sodium chloride 0.9 % 100 mL IVPB (has no administration in time range)  enoxaparin (LOVENOX) injection 40 mg (has no administration in time range)  sodium chloride flush (NS) 0.9 % injection 3 mL (has no administration in time range)  diatrizoate meglumine-sodium (GASTROGRAFIN) 66-10 % solution 90 mL (has no administration in time range)  ketorolac (TORADOL) 15 MG/ML injection 15 mg (has no administration in time range)  pantoprazole (PROTONIX) injection 40 mg (has no administration in time range)  ondansetron (ZOFRAN) injection 4 mg (4 mg Intravenous Given 10/10/22 0025)  sodium chloride 0.9 % bolus 1,000 mL (0 mLs Intravenous Stopped 10/10/22 0229)  potassium chloride 10 mEq in 100 mL IVPB (0 mEq Intravenous Stopped 10/10/22 0615)  HYDROmorphone (DILAUDID) injection 0.5 mg (0.5 mg Intravenous Given 10/10/22 0204)  cefTRIAXone (ROCEPHIN) 1 g in sodium chloride 0.9 % 100 mL IVPB (0 g Intravenous Stopped 10/10/22 0228)  magnesium sulfate IVPB 2 g 50 mL (0 g Intravenous Stopped 10/10/22 1308)    Mobility walks     Focused Assessments    R Recommendations: See Admitting Provider Note  Report given to:   Additional Notes:

## 2022-10-10 NOTE — H&P (Signed)
History and Physical    Patient: Karen [name] QMV:[id number] DOB: [birth year] DOA: 10/09/2022 DOS: the patient was seen and examined on 10/10/2022 PCP: Anabel Halon, MD  Patient coming from: Home  Chief Complaint:  Chief Complaint  Patient presents with   Abdominal Pain    Sent from OSH for SBO    HPI: Karen [name] is a 52 y.o. female with medical history significant of scleroderma, GERD, interstitial lung disease, mild pulmonary hypertension, gastroparesis.  She has also had prior cholecystectomy and abdominal hernia repair in the past.  Patient presented to the ED from [facility] after being sent to that facility to obtain an outpatient CT for nausea and vomiting.  She was subsequently sent to the [facility], ED due to abnormalities found on that CT.  Upon presentation to the ED she was afebrile and normotensive.  At rest her O2 sats were 94%.  Her labs were unremarkable except for potassium of 2.9.  Her urine was also abnormal with hazy appearance, small hemoglobin, large leukocytes, positive nitrite, 30 of protein, specific gravity greater than 1.046 and greater than 50 WBCs.  Review of the previously obtained CT revealed partial small bowel obstruction with a transition point in the anterior abdomen just left of the midline suspected related to adhesions.  Due to recurrent emesis and NG tube has been placed by the EDP and there is currently about 300 cc thick green fluid noted in the collection canister.  Upon my evaluation of the patient she has been having waxing and waning symptoms of intermittent nonfocal abdominal pain with bloating for several weeks.  Yesterday she was unable to pass any flatus and developed abrupt abdominal pain with emesis.  Hospitalist service was asked to evaluate the patient for admission.   Review of Systems: As mentioned in the history of present illness. All other systems reviewed and are negative. Past Medical History:  Diagnosis Date    Acute respiratory failure with hypoxia due to flash pulmonary edema requiring intubation 02/13/2020   Calculus of gallbladder with acute cholecystitis without obstruction    CHF (congestive heart failure) (HCC)    GERD (gastroesophageal reflux disease)    Hypertension    Hypothyroidism    ILD (interstitial lung disease) (HCC) 02/08/2018   Interstitial lung disease (HCC)    Multinodular thyroid    benign FNA 08/2017.   Perimenopause 02/27/2019   Pulmonary edema    Scleroderma (HCC)    Status post laparoscopic cholecystectomy 02/13/20 02/13/2020   Vitamin D deficiency disease 02/27/2019   Past Surgical History:  Procedure Laterality Date   ABDOMINAL HERNIA REPAIR     BUNIONECTOMY Bilateral 10/2018   CHOLECYSTECTOMY N/A 02/13/2020   Procedure: LAPAROSCOPIC CHOLECYSTECTOMY;  Surgeon: Franky Macho, MD;  Location: [facility] ORS;  Service: General;  Laterality: N/A;   COLONOSCOPY WITH PROPOFOL N/A 01/02/2019   Procedure: COLONOSCOPY WITH PROPOFOL;  Surgeon: Corbin Ade, MD;  Location: [facility] ENDO SUITE;  Service: Endoscopy;  Laterality: N/A;  12:30pm   ESOPHAGOGASTRODUODENOSCOPY (EGD) WITH PROPOFOL N/A 01/28/2018   erosive reflux esophagitis, patulous EG Junction, no dilation, incomplete EGD due to retained food in stomach. GES thereafter with delayed gastric emptying.    RIGHT HEART CATH N/A 09/27/2017   Procedure: RIGHT HEART CATH;  Surgeon: Dolores Patty, MD;  Location: [facility] INVASIVE CV LAB;  Service: Cardiovascular;  Laterality: N/A;   RIGHT HEART CATH N/A 09/05/2019   Procedure: RIGHT HEART CATH;  Surgeon: Dolores Patty, MD;  Location: [facility] INVASIVE CV  LAB;  Service: Cardiovascular;  Laterality: N/A;   RIGHT/LEFT HEART CATH AND CORONARY ANGIOGRAPHY N/A 08/18/2021   Procedure: RIGHT/LEFT HEART CATH AND CORONARY ANGIOGRAPHY;  Surgeon: Dolores Patty, MD;  Location: [facility] INVASIVE CV LAB;  Service: Cardiovascular;  Laterality: N/A;   UTERINE FIBROID SURGERY     Social History:  reports that  she has never smoked. She has never used smokeless tobacco. She reports that she does not drink alcohol and does not use drugs.  Allergies  Allergen Reactions   Lisinopril Hives, Swelling and Other (See Comments)   Tocilizumab Other (See Comments)    Swelling and shortness of breath sharpe pains in back and chest tightness.    Family History  Problem Relation Age of Onset   Hypertension Father    Hypertension Sister    Hypertension Brother    Diabetes Maternal Grandfather    Diabetes Maternal Uncle    Diabetes Mother    Colon cancer Neg Hx     Prior to Admission medications   Medication Sig Start Date End Date Taking? Authorizing Provider  acetaminophen (TYLENOL) 325 MG tablet Take 2 tablets (650 mg total) by mouth every 6 (six) hours as needed for mild pain, fever or headache (or Fever >/= 101). 02/15/20   Shon Hale, MD  amLODipine (NORVASC) 10 MG tablet Take 1 tablet (10 mg total) by mouth daily. 08/17/22   Anabel Halon, MD  benzonatate (TESSALON) 100 MG capsule Take 1 capsule (100 mg total) by mouth 2 (two) times daily as needed for cough. 04/13/22   Anabel Halon, MD  bisoprolol (ZEBETA) 5 MG tablet Take 1 tablet (5 mg total) by mouth daily. 08/24/22   Bensimhon, Bevelyn Buckles, MD  clindamycin-benzoyl peroxide (BENZACLIN) gel Apply 1 Application topically as needed.    [provider]  clobetasol cream (TEMOVATE) 0.05 % Apply 1 Application topically as needed.    [provider]  esomeprazole (NEXIUM) 40 MG capsule Take 1 capsule (40 mg total) by mouth 2 (two) times daily. 06/05/22   Aida Raider, NP  Fluocinolone Acetonide Scalp 0.01 % OIL Apply to affected frontal scalp 4 (four) times a week. Not to face 07/27/22     furosemide (LASIX) 40 MG tablet Take 1 tablet (40 mg total) by mouth 3 (three) times a week. Paulo Fruit, Fri 08/25/22   Bensimhon, Bevelyn Buckles, MD  macitentan (OPSUMIT) 10 MG tablet Take 1 tablet (10 mg total) by mouth daily. 03/24/22   Bensimhon,  Bevelyn Buckles, MD  meloxicam (MOBIC) 15 MG tablet Take 1 tablet (15 mg total) by mouth daily as needed. 07/17/22     Multiple Vitamins-Minerals (HM MULTIVITAMIN ADULT GUMMY PO) Take 2 tablets by mouth daily.    [provider]  mycophenolate (CELLCEPT) 500 MG tablet Take 3 tablets (1,500 mg total) by mouth every morning AND 3 tablets (1,500 mg total) every evening. 01/18/22   Mannam, Colbert Coyer, MD  Nintedanib (OFEV) 150 MG CAPS Take 1 capsule (150 mg total) by mouth 2 (two) times daily. 08/28/22   Quentin Angst, MD  NP THYROID 120 MG tablet Take 1 tablet (120 mg total) by mouth daily before breakfast. 08/18/22   Anabel Halon, MD  ondansetron (ZOFRAN) 4 MG tablet Take 1 tablet (4 mg total) by mouth every 8 (eight) hours as needed for nausea or vomiting. 10/09/22   Anabel Halon, MD  potassium chloride 20 MEQ/15ML (10%) SOLN Take 15 mLs (20 mEq total) by mouth daily with furosemide 08/17/22  Anabel Halon, MD  spironolactone (ALDACTONE) 25 MG tablet Take 1 tablet (25 mg total) by mouth 2 (two) times daily. 08/17/22   Anabel Halon, MD  triamcinolone cream (KENALOG) 0.1 % Apply 1 Application topically daily as needed.    [provider]  PARoxetine (PAXIL) 10 MG tablet Take 1 tablet (10 mg total) by mouth daily as directed 10/07/20 12/02/20      Physical Exam: Vitals:   10/10/22 0500 10/10/22 0530 10/10/22 0600 10/10/22 0620  BP: (!) 122/97 114/89 109/83   Pulse: 78 79 77   Resp: 16 16 13    Temp:    98 F (36.7 C)  TempSrc:    Oral  SpO2: 93% 91% 92%   Weight:      Height:       Constitutional: NAD, calm, comfortable Respiratory: clear to auscultation with some fine expiratory crackles bilaterally, no wheezing, no crackles. Normal respiratory effort. No accessory muscle use.  Cardiovascular: Regular rate and rhythm, no murmurs / rubs / gallops. No extremity edema. 2+ pedal pulses. No carotid bruits.  Abdomen: Minimally tender, slightly distended and tight but patient  overall reports improvement.  NG tube to low wall suction with bilious returns.  No masses palpated. No hepatosplenomegaly. Bowel sounds not auscultated Musculoskeletal: no clubbing / cyanosis. No joint deformity upper and lower extremities. Good ROM, no contractures. Normal muscle tone.  Skin: no rashes, lesions, ulcers. No induration-facies smooth and tight consistent with scleroderma illness Neurologic: CN 2-12 grossly intact. Sensation intact,  Strength 5/5 x all 4 extremities.  Psychiatric: Normal judgment and insight. Alert and oriented x 3. Normal mood .   Data Reviewed:  As per HPI  Assessment and Plan: Recurrent small bowel obstruction suspected related to adhesions Recent hospitalization for small bowel obstruction in February 2024 which resolved with conservative medical management Have consulted surgery to assist Continue bowel rest, NG tube decompression and supportive care (IV fluids, combination of narcotic and nonnarcotic analgesia) Surgery to initiate SBO protocol Patient also has a history of gastroparesis likely secondary to scleroderma processes  Acute hypokalemia Secondary to GI losses Replace IV and follow labs Initial potassium was 2.9 and has increased to 3.1 Will give maintenance fluids with potassium as well  Abnormal urinalysis Concerning for possible UTI Continue Rocephin as initiated in the ED and narrow based on culture results  Scleroderma/mild pulmonary hypertension and interstitial lung disease WHO GROUP I and III Followed by pulmonary medicine and cardiology in the outpatient setting Cardiogram in 2023 demonstrated an EF of 70 to 75% with moderate LVH Patient has also undergone a right heart cath which confirmed mild pulmonary hypertension Cardiology managing the patient's macitentan, OFEV and CellCept all of which are on hold currently due to patient's n.p.o. status Followed as well at the [facility] lung transplant clinic  Chronic diastolic heart  failure On Lasix 40 mg Monday Wednesday Friday and as needed Will hold Norvasc, bisoprolol, Aldactone and Lasix  Hypothyroidism Hold oral thyroid replacement while n.p.o.     Advance Care Planning:   Code Status: Full Code   DVT prophylaxis: Lovenox  Consults: General surgery  Family Communication: Mother at bedside  Severity of Illness: The appropriate patient status for this patient is INPATIENT. Inpatient status is judged to be reasonable and necessary in order to provide the required intensity of service to ensure the patient's safety. The patient's presenting symptoms, physical exam findings, and initial radiographic and laboratory data in the context of their chronic comorbidities is felt to  place them at high risk for further clinical deterioration. Furthermore, it is not anticipated that the patient will be medically stable for discharge from the hospital within 2 midnights of admission.   * I certify that at the point of admission it is my clinical judgment that the patient will require inpatient hospital care spanning beyond 2 midnights from the point of admission due to high intensity of service, high risk for further deterioration and high frequency of surveillance required.*  Author: Junious Silk, NP 10/10/2022 6:56 AM  For on call review [email or web address].

## 2022-10-11 ENCOUNTER — Inpatient Hospital Stay (HOSPITAL_COMMUNITY): Payer: Commercial Managed Care - PPO

## 2022-10-11 DIAGNOSIS — N3000 Acute cystitis without hematuria: Secondary | ICD-10-CM

## 2022-10-11 DIAGNOSIS — E039 Hypothyroidism, unspecified: Secondary | ICD-10-CM

## 2022-10-11 DIAGNOSIS — N3001 Acute cystitis with hematuria: Secondary | ICD-10-CM

## 2022-10-11 DIAGNOSIS — E876 Hypokalemia: Secondary | ICD-10-CM | POA: Diagnosis not present

## 2022-10-11 DIAGNOSIS — K56609 Unspecified intestinal obstruction, unspecified as to partial versus complete obstruction: Secondary | ICD-10-CM | POA: Diagnosis not present

## 2022-10-11 DIAGNOSIS — E860 Dehydration: Secondary | ICD-10-CM

## 2022-10-11 DIAGNOSIS — I5032 Chronic diastolic (congestive) heart failure: Secondary | ICD-10-CM

## 2022-10-11 DIAGNOSIS — I272 Pulmonary hypertension, unspecified: Secondary | ICD-10-CM

## 2022-10-11 DIAGNOSIS — J849 Interstitial pulmonary disease, unspecified: Secondary | ICD-10-CM

## 2022-10-11 DIAGNOSIS — M349 Systemic sclerosis, unspecified: Secondary | ICD-10-CM

## 2022-10-11 HISTORY — DX: Acute cystitis without hematuria: N30.00

## 2022-10-11 HISTORY — DX: Chronic diastolic (congestive) heart failure: I50.32

## 2022-10-11 LAB — BASIC METABOLIC PANEL
Anion gap: 15 (ref 5–15)
BUN: 12 mg/dL (ref 6–20)
CO2: 21 mmol/L — ABNORMAL LOW (ref 22–32)
Calcium: 9.7 mg/dL (ref 8.9–10.3)
Chloride: 107 mmol/L (ref 98–111)
Creatinine, Ser: 0.78 mg/dL (ref 0.44–1.00)
GFR, Estimated: >60 mL/min (ref >60)
Glucose, Bld: 60 mg/dL — ABNORMAL LOW (ref 70–99)
Potassium: 4 mmol/L (ref 3.5–5.1)
Sodium: 143 mmol/L (ref 135–145)

## 2022-10-11 LAB — CBC WITH DIFFERENTIAL/PLATELET
Abs Immature Granulocytes: 0.02 10*3/uL (ref 0.00–0.07)
Basophils Absolute: 0 10*3/uL (ref 0.0–0.1)
Basophils Relative: 0 %
Eosinophils Absolute: 0.2 10*3/uL (ref 0.0–0.5)
Eosinophils Relative: 2 %
HCT: 39.4 % (ref 36.0–46.0)
Hemoglobin: 11.7 g/dL — ABNORMAL LOW (ref 12.0–15.0)
Immature Granulocytes: 0 %
Lymphocytes Relative: 22 %
Lymphs Abs: 1.5 10*3/uL (ref 0.7–4.0)
MCH: 27 pg (ref 26.0–34.0)
MCHC: 29.7 g/dL — ABNORMAL LOW (ref 30.0–36.0)
MCV: 91 fL (ref 80.0–100.0)
Monocytes Absolute: 0.5 10*3/uL (ref 0.1–1.0)
Monocytes Relative: 8 %
Neutro Abs: 4.5 10*3/uL (ref 1.7–7.7)
Neutrophils Relative %: 68 %
Platelets: 173 10*3/uL (ref 150–400)
RBC: 4.33 MIL/uL (ref 3.87–5.11)
RDW: 13.2 % (ref 11.5–15.5)
WBC: 6.7 10*3/uL (ref 4.0–10.5)
nRBC: 0 % (ref 0.0–0.2)

## 2022-10-11 LAB — MAGNESIUM: Magnesium: 2 mg/dL (ref 1.7–2.4)

## 2022-10-11 MED ORDER — PHENOL 1.4 % MT LIQD: 1.0000 | OROMUCOSAL | Status: DC | PRN

## 2022-10-11 MED ORDER — SODIUM CHLORIDE 0.9 % IV SOLN
2.0000 g | INTRAVENOUS | Status: DC
  Administered 2022-10-11: 2 g via INTRAVENOUS
  Filled 2022-10-11: qty 20

## 2022-10-11 MED ORDER — PHENOL 1.4 % MT LIQD
1.0000 | Freq: Three times a day (TID) | OROMUCOSAL | Status: DC
  Administered 2022-10-11 – 2022-10-12 (×3): 1 via OROMUCOSAL
  Filled 2022-10-11: qty 177

## 2022-10-11 NOTE — Telephone Encounter (Signed)
Called in to check status on FMLA . Also needs to be intermittent FMLA

## 2022-10-11 NOTE — Telephone Encounter (Signed)
Forms are in Dr Allena Katz box to be filled out

## 2022-10-11 NOTE — Progress Notes (Signed)
Progress Note: General Surgery Service   Chief Complaint/Subjective: Passing some flatus.  Tired of NG.  Sore throat  Objective: Vital signs in last 24 hours: Temp:  [97.7 F (36.5 C)-98.3 F (36.8 C)] 98 F (36.7 C) (06/05 1248) Pulse Rate:  [72-88] 88 (06/05 1248) Resp:  [18-19] 18 (06/05 1248) BP: (125-145)/(73-89) 128/77 (06/05 1248) SpO2:  [95 %-98 %] 98 % (06/05 1248) Last BM Date : 10/07/22  Intake/Output from previous day: 06/04 0701 - 06/05 0700 In: 148.9 [IV Piggyback:148.9] Out: -  Intake/Output this shift: No intake/output data recorded.  Constitutional: NAD; conversant; no deformities Eyes: Moist conjunctiva; no lid lag; anicteric; PERRL Neck: Trachea midline; no thyromegaly Lungs: Normal respiratory effort; no tactile fremitus CV: RRR; no palpable thrills; no pitting edema GI: Abd Soft, nontender; no palpable hepatosplenomegaly MSK: Normal range of motion of extremities; no clubbing/cyanosis Psychiatric: Appropriate affect; alert and oriented x3 Lymphatic: No palpable cervical or axillary lymphadenopathy  Lab Results: CBC  Recent Labs    10/10/22 0817 10/11/22 0932  WBC 7.6 6.7  HGB 11.3* 11.7*  HCT 37.2 39.4  PLT 211 173   BMET Recent Labs    10/10/22 0220 10/10/22 0817 10/11/22 0820  NA 141  --  143  K 3.1*  --  4.0  CL 106  --  107  CO2 24  --  21*  GLUCOSE 95  --  60*  BUN 8  --  12  CREATININE 0.67 0.64 0.78  CALCIUM 9.7  --  9.7   PT/INR No results for input(s): "LABPROT", "INR" in the last 72 hours. ABG No results for input(s): "PHART", "HCO3" in the last 72 hours.  Invalid input(s): "PCO2", "PO2"  Anti-infectives: Anti-infectives (From admission, onward)    Start     Dose/Rate Route Frequency Ordered Stop   10/11/22 2200  cefTRIAXone (ROCEPHIN) 2 g in sodium chloride 0.9 % 100 mL IVPB        2 g 200 mL/hr over 30 Minutes Intravenous Every 24 hours 10/11/22 0831     10/10/22 2200  cefTRIAXone (ROCEPHIN) 1 g in sodium  chloride 0.9 % 100 mL IVPB  Status:  Discontinued        1 g 200 mL/hr over 30 Minutes Intravenous Every 24 hours 10/10/22 0652 10/11/22 0831   10/10/22 0200  cefTRIAXone (ROCEPHIN) 1 g in sodium chloride 0.9 % 100 mL IVPB        1 g 200 mL/hr over 30 Minutes Intravenous  Once 10/10/22 0159 10/10/22 0228       Medications: Scheduled Meds:  enoxaparin (LOVENOX) injection  40 mg Subcutaneous Daily   pantoprazole (PROTONIX) IV  40 mg Intravenous Q24H   phenol  1 spray Mouth/Throat TID   sodium chloride flush  3 mL Intravenous Q12H   Continuous Infusions:  cefTRIAXone (ROCEPHIN)  IV     PRN Meds:.acetaminophen **OR** acetaminophen, HYDROmorphone (DILAUDID) injection, ketorolac, naLOXone (NARCAN)  injection, ondansetron (ZOFRAN) IV, phenol  Assessment/Plan: Ms. Lenker has a SBO  Appears to be responding to NG decompression Contrast in colon on XR this AM NG Clamp and clear liquid trial today Hopefully resolves with conservative management   LOS: 1 day    Quentin Ore, MD  [facility] Use [email or web address] to contact on call provider  Daily Billing: [id number]

## 2022-10-11 NOTE — Progress Notes (Signed)
PROGRESS NOTE    Karen [name]  BJY:[id number] DOB: 08/18/1970 DOA: 10/09/2022 PCP: Anabel Halon, MD   Chief Complaint  Patient presents with   Abdominal Pain    Sent from OSH for SBO     Brief Narrative:  52 year old female history of scleroderma, GERD, ILD, pulmonary hypertension, history of cholecystectomy, abdominal hernia repair presented to the ED with complaints of nausea vomiting abdominal pain.  CT abdomen and pelvis done 10/09/2022 suggestive of small bowel obstruction and patient sent for admission.  Patient last admitted February 2024, small bowel obstruction resolved with conservative management.  NG tube placed.  General surgery consulted.   Assessment & Plan:   Principal Problem:   SBO (small bowel obstruction) (HCC) Active Problems:   Acute cystitis with hematuria   Dehydration   Hypomagnesemia   Chronic diastolic CHF (congestive heart failure) (HCC)   Hypothyroidism   Interstitial lung disease (HCC)  #1 small bowel obstruction -Likely secondary to adhesions, patient with history of prior abdominal surgeries and scleroderma. -CT abdomen and pelvis done with findings consistent with small bowel obstruction with transition point in the anterior abdomen left of the midline likely related to adhesions. -NG tube placed, output not correctly recorded. -Patient with some clinical improvement, no nausea or vomiting, abdominal pain improved. -Patient with flatus, no bowel movement. -States she is hungry asking for diet. -Patient with small bowel obstruction protocol underway. -Diet has been advanced per general surgery to clear liquids today. -Clamping trial per general surgery today. -Replete electrolytes to keep potassium approximately 4, magnesium approximately 2. -Per general surgery.  2.  Dehydration -IV fluids.  3.  Hypokalemia/hypomagnesemia -Repleted.  4.  UTI -Preliminary urine cultures with 100,000 colonies of Klebsiella pneumoniae -Change IV  Rocephin to 2 g daily.  5.  Scleroderma/mild pulmonary hypertension/interstitial lung disease - WHO group 1 and 3 -Noted to be followed by pulmonary medicine and cardiology outpatient setting at [facility]. -Echocardiogram in 2023 with EF of 70 to 75%, moderate LVH. -Patient underwent right heart cath which confirmed mild pulmonary hypertension. -Cardio patient's macitentan, O FEV and CellCept all of which are on hold currently due to patient's n.p.o. status. -Followed as well in the lung transplant clinic.  6.  Chronic diastolic CHF -Noted on Lasix 40 mg Monday Wednesday Friday and as needed. -Continue to hold Norvasc, bisoprolol, Aldactone and Lasix while NPO.  7.  Hypothyroidism Synthroid on hold while n.p.o., will resume when tolerating a full diet.   DVT prophylaxis: Lovenox Code Status: Full Family Communication: Updated patient, mother at bedside. Disposition: Home when clinically improved, tolerating oral intake, resolution of SBO, cleared by general surgery.  Status is: Inpatient Remains inpatient appropriate because: Severity of illness.   Consultants:  General surgery: Dr.Stechschulte 10/10/2022  Procedures:  CT abdomen and pelvis 10/09/2022 Chest x-ray 10/10/2022 Small bowel obstruction protocol  Antimicrobials:  Anti-infectives (From admission, onward)    Start     Dose/Rate Route Frequency Ordered Stop   10/11/22 2200  cefTRIAXone (ROCEPHIN) 2 g in sodium chloride 0.9 % 100 mL IVPB        2 g 200 mL/hr over 30 Minutes Intravenous Every 24 hours 10/11/22 0831     10/10/22 2200  cefTRIAXone (ROCEPHIN) 1 g in sodium chloride 0.9 % 100 mL IVPB  Status:  Discontinued        1 g 200 mL/hr over 30 Minutes Intravenous Every 24 hours 10/10/22 0652 10/11/22 0831   10/10/22 0200  cefTRIAXone (ROCEPHIN) 1 g in sodium chloride 0.9 %  100 mL IVPB        1 g 200 mL/hr over 30 Minutes Intravenous  Once 10/10/22 0159 10/10/22 0228         Subjective: Patient laying in bed.  NG  tube in.  Patient complaining of sore throat, nasal pain.  Denies any chest pain or shortness of breath.  No abdominal pain.  Passing flatus.  No bowel movement.  States she is hungry, frustrated and ready to go home.  Mother at bedside.  Objective: Vitals:   10/10/22 2036 10/11/22 0504 10/11/22 0741 10/11/22 1248  BP: (!) 145/89 135/73 125/85 128/77  Pulse: 83 79 72 88  Resp: 19 18 18 18   Temp: 97.7 F (36.5 C) 98.3 F (36.8 C) 97.9 F (36.6 C) 98 F (36.7 C)  TempSrc:      SpO2: 96% 96% 95% 98%  Weight:      Height:        Intake/Output Summary (Last 24 hours) at 10/11/2022 1610 Last data filed at 10/11/2022 0336 Gross per 24 hour  Intake 100 ml  Output --  Net 100 ml   Filed Weights   10/09/22 2129  Weight: 61.8 kg    Examination:  General exam: Appears calm and comfortable.  NG tube in place. Respiratory system: Clear to auscultation bilaterally.  No wheezes, no crackles, no rhonchi.  Fair air movement.  Speaking in full sentences.Marland Kitchen Respiratory effort normal. Cardiovascular system: S1 & S2 heard, RRR. No JVD, murmurs, rubs, gallops or clicks. No pedal edema. Gastrointestinal system: Abdomen is nondistended, soft and nontender. No organomegaly or masses felt. Normal bowel sounds heard. Central nervous system: Alert and oriented. No focal neurological deficits. Extremities: Symmetric 5 x 5 power. Skin: No rashes, lesions or ulcers Psychiatry: Judgement and insight appear normal. Mood & affect appropriate.     Data Reviewed: I have personally reviewed following labs and imaging studies  CBC: Recent Labs  Lab 10/10/22 0020 10/10/22 0220 10/10/22 0817 10/11/22 0932  WBC 7.2 7.2 7.6 6.7  NEUTROABS 4.0 3.7  --  4.5  HGB 11.7* 12.4 11.3* 11.7*  HCT 37.9 40.1 37.2 39.4  MCV 88.6 90.9 90.5 91.0  PLT 212 202 211 173    Basic Metabolic Panel: Recent Labs  Lab 10/10/22 0020 10/10/22 0220 10/10/22 0817 10/11/22 0820 10/11/22 0932  NA 140 141  --  143  --   K  2.9* 3.1*  --  4.0  --   CL 106 106  --  107  --   CO2 24 24  --  21*  --   GLUCOSE 92 95  --  60*  --   BUN 8 8  --  12  --   CREATININE 0.59 0.67 0.64 0.78  --   CALCIUM 10.1 9.7  --  9.7  --   MG  --  1.4*  --   --  2.0    GFR: Estimated Creatinine Clearance: 72.3 mL/min (by C-G formula based on SCr of 0.78 mg/dL).  Liver Function Tests: Recent Labs  Lab 10/10/22 0020 10/10/22 0220  AST 17 17  ALT 12 12  ALKPHOS 77 76  BILITOT 0.8 0.9  PROT 6.9 6.9  ALBUMIN 3.6 3.5    CBG: No results for input(s): "GLUCAP" in the last 168 hours.   Recent Results (from the past 240 hour(s))  Urine Culture     Status: Abnormal (Preliminary result)   Collection Time: 10/10/22  1:30 AM   Specimen: Urine, Clean Catch  Result Value Ref Range Status   Specimen Description URINE, CLEAN CATCH  Final   Special Requests NONE  Final   Culture (A)  Final    >=100,000 COLONIES/mL KLEBSIELLA PNEUMONIAE SUSCEPTIBILITIES TO FOLLOW Performed at [facility], [address], [place], [place] [address]    Report Status PENDING  Incomplete         Radiology Studies: DG Abd Portable 1V-Small Bowel Obstruction Protocol-24 hr delay  Result Date: 10/11/2022 CLINICAL DATA:  Evaluate nasogastric tube placement. EXAM: PORTABLE ABDOMEN - 1 VIEW COMPARISON:  10/10/2022 FINDINGS: Enteric tube is identified with tip and side port in the stomach. Surgical clips noted in the right upper quadrant of the abdomen. No dilated loops of large or small bowel. Gas noted up to the level of the rectum. IMPRESSION: Enteric tube tip and side port are in the stomach. Electronically Signed   By: Signa Kell M.D.   On: 10/11/2022 07:10   DG Abd Portable 1V-Small Bowel Obstruction Protocol-initial, 8 hr delay  Result Date: 10/10/2022 CLINICAL DATA:  Small-bowel obstruction EXAM: PORTABLE ABDOMEN - 1 VIEW COMPARISON:  CT abdomen and pelvis 10/09/2022 FINDINGS: Gaseous distention of the ascending and transverse colon.  The small bowel is not well visualized likely due intraluminal fluid. There is a mildly dilated loop of small bowel in the left lower quadrant measuring 3.2 cm. Distended bladder filled with contrast. Cholecystectomy clips. No acute osseous abnormality. IMPRESSION: Mildly dilated loop of small bowel in the left lower quadrant measuring 3.2 cm. The remaining small bowel is not well visualized likely due to intraluminal fluid. Electronically Signed   By: Minerva Fester M.D.   On: 10/10/2022 20:58   DG Chest Portable 1 View  Result Date: 10/10/2022 CLINICAL DATA:  Check gastric catheter placement EXAM: PORTABLE CHEST 1 VIEW COMPARISON:  None Available. FINDINGS: Gastric catheter extends into the stomach. Cardiac shadow is prominent but accentuated by the portable technique. Bibasilar atelectatic changes are noted. This may be related to the poor inspiratory effort. IMPRESSION: Gastric catheter within the stomach. Bibasilar atelectasis likely related to a poor inspiratory effort. Electronically Signed   By: Alcide Clever M.D.   On: 10/10/2022 02:27   CT Abdomen Pelvis W Contrast  Result Date: 10/09/2022 CLINICAL DATA:  Abdominal pain EXAM: CT ABDOMEN AND PELVIS WITH CONTRAST TECHNIQUE: Multidetector CT imaging of the abdomen and pelvis was performed using the standard protocol following bolus administration of intravenous contrast. RADIATION DOSE REDUCTION: This exam was performed according to the departmental dose-optimization program which includes automated exposure control, adjustment of the mA and/or kV according to patient size and/or use of iterative reconstruction technique. CONTRAST:  OMNIPAQUE IOHEXOL 300 MG/ML  SOLN COMPARISON:  06/06/2022 FINDINGS: Lower chest: Mild subpleural fibrotic changes are noted. Hepatobiliary: No focal liver abnormality is seen. Status post cholecystectomy. No biliary dilatation. Pancreas: Unremarkable. No pancreatic ductal dilatation or surrounding inflammatory changes.  Spleen: Normal in size without focal abnormality. Adrenals/Urinary Tract: Adrenal glands are within normal limits. Kidneys demonstrate a normal enhancement pattern bilaterally. No obstructive changes are seen. Bilateral renal calculi are noted measuring up to 3 mm on the left. No ureteral stones are noted. The bladder is decompressed. Stomach/Bowel: Colon is predominately decompressed. The appendix is within normal limits. Stomach is decompressed. Multiple dilated fluid-filled loops of small bowel are noted. Transition point appears to lie within the mid abdomen anteriorly and is likely related to adhesions. Distal small bowel appears within normal limits. Vascular/Lymphatic: Aortic atherosclerosis. No enlarged abdominal or  pelvic lymph nodes. Reproductive: Uterine fibroids are noted largest of which measures up to 7.7 cm. No adnexal mass is seen. Other: No significant free fluid is noted.  No free air is seen. Musculoskeletal: No acute bony abnormality is noted. IMPRESSION: Changes consistent with partial small bowel obstruction. Transition point appears to lie in the anterior abdomen just to the left of the midline. This is likely related to adhesions. Remainder of the exam is stable with chronic findings of uterine fibroids and nonobstructing renal calculi. Aortic Atherosclerosis (ICD10-I70.0). Electronically Signed   By: Alcide Clever M.D.   On: 10/09/2022 19:20        Scheduled Meds:  enoxaparin (LOVENOX) injection  40 mg Subcutaneous Daily   pantoprazole (PROTONIX) IV  40 mg Intravenous Q24H   phenol  1 spray Mouth/Throat TID   sodium chloride flush  3 mL Intravenous Q12H   Continuous Infusions:  cefTRIAXone (ROCEPHIN)  IV       LOS: 1 day    Time spent: 35 minutes    Ramiro Harvest, MD [facility]   To contact the attending provider between 7A-7P or the covering provider during after hours 7P-7A, please log into the web site [email or web address] and access using universal [facility] password for that web site. If you do not have the password, please call the hospital operator.  10/11/2022, 4:10 PM

## 2022-10-11 NOTE — Progress Notes (Signed)
NG tube removed per order. Patient tolerated removal well. Patient resting comfortably in bed. Bed lowered and call bell within reach.

## 2022-10-12 ENCOUNTER — Other Ambulatory Visit (HOSPITAL_COMMUNITY): Payer: Self-pay

## 2022-10-12 DIAGNOSIS — N3001 Acute cystitis with hematuria: Secondary | ICD-10-CM | POA: Diagnosis not present

## 2022-10-12 DIAGNOSIS — E860 Dehydration: Secondary | ICD-10-CM | POA: Diagnosis not present

## 2022-10-12 DIAGNOSIS — K56609 Unspecified intestinal obstruction, unspecified as to partial versus complete obstruction: Secondary | ICD-10-CM | POA: Diagnosis not present

## 2022-10-12 DIAGNOSIS — E876 Hypokalemia: Secondary | ICD-10-CM | POA: Diagnosis not present

## 2022-10-12 LAB — BASIC METABOLIC PANEL
Anion gap: 11 (ref 5–15)
BUN: 7 mg/dL (ref 6–20)
CO2: 22 mmol/L (ref 22–32)
Calcium: 9.7 mg/dL (ref 8.9–10.3)
Chloride: 105 mmol/L (ref 98–111)
Creatinine, Ser: 0.61 mg/dL (ref 0.44–1.00)
GFR, Estimated: >60 mL/min (ref >60)
Glucose, Bld: 90 mg/dL (ref 70–99)
Potassium: 3.6 mmol/L (ref 3.5–5.1)
Sodium: 138 mmol/L (ref 135–145)

## 2022-10-12 LAB — CBC
HCT: 37.7 % (ref 36.0–46.0)
Hemoglobin: 11.8 g/dL — ABNORMAL LOW (ref 12.0–15.0)
MCH: 27 pg (ref 26.0–34.0)
MCHC: 31.3 g/dL (ref 30.0–36.0)
MCV: 86.3 fL (ref 80.0–100.0)
Platelets: 193 10*3/uL (ref 150–400)
RBC: 4.37 MIL/uL (ref 3.87–5.11)
RDW: 13.1 % (ref 11.5–15.5)
WBC: 5.6 10*3/uL (ref 4.0–10.5)
nRBC: 0 % (ref 0.0–0.2)

## 2022-10-12 LAB — MAGNESIUM: Magnesium: 1.9 mg/dL (ref 1.7–2.4)

## 2022-10-12 LAB — URINE CULTURE: Culture: >=100000 — AB

## 2022-10-12 MED ORDER — POTASSIUM CHLORIDE CRYS ER 20 MEQ PO TBCR: 40.0000 meq | EXTENDED_RELEASE_TABLET | Freq: Once | ORAL | Status: DC

## 2022-10-12 MED ORDER — CEFADROXIL 500 MG PO CAPS
1000.0000 mg | ORAL_CAPSULE | Freq: Two times a day (BID) | ORAL | 0 refills | Status: DC
  Filled 2022-10-12: qty 14, 4d supply, fill #0

## 2022-10-12 MED ORDER — POTASSIUM CHLORIDE 20 MEQ PO PACK
40.0000 meq | PACK | Freq: Once | ORAL | Status: AC
  Administered 2022-10-12: 40 meq via ORAL
  Filled 2022-10-12: qty 2

## 2022-10-12 MED ORDER — PANTOPRAZOLE SODIUM 40 MG PO TBEC: 40.0000 mg | DELAYED_RELEASE_TABLET | Freq: Every day | ORAL | Status: DC

## 2022-10-12 MED ORDER — CEFADROXIL 500 MG PO CAPS
1000.0000 mg | ORAL_CAPSULE | Freq: Two times a day (BID) | ORAL | Status: DC
  Administered 2022-10-12: 1000 mg via ORAL
  Filled 2022-10-12 (×2): qty 2

## 2022-10-12 MED ORDER — PROCHLORPERAZINE EDISYLATE 10 MG/2ML IJ SOLN
10.0000 mg | Freq: Once | INTRAMUSCULAR | Status: AC
  Administered 2022-10-12: 10 mg via INTRAVENOUS
  Filled 2022-10-12: qty 2

## 2022-10-12 NOTE — Progress Notes (Signed)
PROGRESS NOTE    Karen [name]  ZOX:[id number] DOB: Jul 18, 1970 DOA: 10/09/2022 PCP: Anabel Halon, MD   Chief Complaint  Patient presents with   Abdominal Pain    Sent from OSH for SBO     Brief Narrative:  52 year old female history of scleroderma, GERD, ILD, pulmonary hypertension, history of cholecystectomy, abdominal hernia repair presented to the ED with complaints of nausea vomiting abdominal pain.  CT abdomen and pelvis done 10/09/2022 suggestive of small bowel obstruction and patient sent for admission.  Patient last admitted February 2024, small bowel obstruction resolved with conservative management.  NG tube placed.  General surgery consulted.   Assessment & Plan:   Principal Problem:   SBO (small bowel obstruction) (HCC) Active Problems:   Acute cystitis with hematuria   Dehydration   Hypomagnesemia   Chronic diastolic CHF (congestive heart failure) (HCC)   Hypothyroidism   Interstitial lung disease (HCC)  #1 small bowel obstruction -Likely secondary to adhesions, patient with history of prior abdominal surgeries and scleroderma. -CT abdomen and pelvis done with findings consistent with small bowel obstruction with transition point in the anterior abdomen left of the midline likely related to adhesions. -NG tube placed, on admission, patient underwent a clamping trial 10/11/2022 and NG tube subsequently has been removed/discontinued by general surgery.   -Patient placed on a clear liquid diet advanced to a full liquid diet which she seems to be tolerating.   -Improving clinically.  -Patient with flatus, no bowel movement. -Diet has been advanced per general surgery to a soft diet.   -Replete electrolytes to keep potassium approximately 4, magnesium approximately 2. -Per general surgery if patient tolerates soft diet and has bowel movement with continued clinical improvement would likely be ready for discharge. -Per general surgery.  2.  Dehydration -IV  fluids.  3.  Hypokalemia/hypomagnesemia -Repleted.  4.  Klebsiella pneumoniae UTI -Urine cultures with > 100,000 colonies of Klebsiella pneumoniae. -Cultures sensitive to the cephalosporins, fluoroquinolones, gentamicin, imipenem, Bactrim, Unasyn and Zosyn.  Resistant to nitrofurantoin and ampicillin. -Will transition from IV Rocephin to oral Duricef to complete a 5-day course of antibiotic treatment.  5.  Scleroderma/mild pulmonary hypertension/interstitial lung disease - WHO group 1 and 3 -Noted to be followed by pulmonary medicine and cardiology outpatient setting at [facility]. -Echocardiogram in 2023 with EF of 70 to 75%, moderate LVH. -Patient underwent right heart cath which confirmed mild pulmonary hypertension. -Cardio patient's macitentan, O FEV and CellCept all of which are on hold currently due to patient's SBO.   -Patient's diet advanced and currently on a soft diet which we will monitor for now and if patient tolerates could likely resume home regimen of medications tomorrow.  -Followed as well in the lung transplant clinic.  6.  Chronic diastolic CHF -Noted on Lasix 40 mg Monday Wednesday Friday and as needed. -Continue to hold Norvasc, bisoprolol, Aldactone and Lasix.  7.  Hypothyroidism -Resume home regimen Synthroid as diet has been advanced per general surgery.    DVT prophylaxis: Lovenox Code Status: Full Family Communication: Updated patient, mother at bedside. Disposition: Home when clinically improved, tolerating oral intake, resolution of SBO, cleared by general surgery.  Status is: Inpatient Remains inpatient appropriate because: Severity of illness.   Consultants:  General surgery: Dr.Stechschulte 10/10/2022  Procedures:  CT abdomen and pelvis 10/09/2022 Chest x-ray 10/10/2022 Small bowel obstruction protocol  Antimicrobials:  Anti-infectives (From admission, onward)    Start     Dose/Rate Route Frequency Ordered Stop   10/11/22 2200  cefTRIAXone  (  ROCEPHIN) 2 g in sodium chloride 0.9 % 100 mL IVPB        2 g 200 mL/hr over 30 Minutes Intravenous Every 24 hours 10/11/22 0831     10/10/22 2200  cefTRIAXone (ROCEPHIN) 1 g in sodium chloride 0.9 % 100 mL IVPB  Status:  Discontinued        1 g 200 mL/hr over 30 Minutes Intravenous Every 24 hours 10/10/22 0652 10/11/22 0831   10/10/22 0200  cefTRIAXone (ROCEPHIN) 1 g in sodium chloride 0.9 % 100 mL IVPB        1 g 200 mL/hr over 30 Minutes Intravenous  Once 10/10/22 0159 10/10/22 0228         Subjective: Laying in bed, soft diet on tray patient about to eat.  Tolerated full liquids yesterday.  NG tube discontinued.  Denies any further nausea or vomiting.  No abdominal pain.  Passing flatus.  No bowel movement.  Is hopeful she will have a bowel movement today so she can be discharged.  Mother at bedside.   Objective: Vitals:   10/11/22 1248 10/11/22 1626 10/11/22 2200 10/12/22 0747  BP: 128/77 131/81 131/76 (!) 136/95  Pulse: 88 76 82 80  Resp: 18  18 18   Temp: 98 F (36.7 C) 97.9 F (36.6 C) 98 F (36.7 C) 97.8 F (36.6 C)  TempSrc:   Oral   SpO2: 98% 100% 99% 100%  Weight:      Height:        Intake/Output Summary (Last 24 hours) at 10/12/2022 1047 Last data filed at 10/11/2022 2203 Gross per 24 hour  Intake 3 ml  Output --  Net 3 ml    Filed Weights   10/09/22 2129  Weight: 61.8 kg    Examination:  General exam: Appears calm and comfortable.  Respiratory system: Lungs clear to auscultation bilaterally.  No wheezes, no crackles, no rhonchi.  Fair air movement.  Speaking in full sentences.   Cardiovascular system: Regular rate rhythm no murmurs rubs or gallops.  No JVD.  No lower extremity edema.  Gastrointestinal system: Abdomen is soft, nontender, nondistended, positive bowel sounds.  No rebound.  No guarding.   Central nervous system: Alert and oriented. No focal neurological deficits. Extremities: Symmetric 5 x 5 power. Skin: No rashes, lesions or  ulcers Psychiatry: Judgement and insight appear normal. Mood & affect appropriate.     Data Reviewed: I have personally reviewed following labs and imaging studies  CBC: Recent Labs  Lab 10/10/22 0020 10/10/22 0220 10/10/22 0817 10/11/22 0932 10/12/22 0829  WBC 7.2 7.2 7.6 6.7 5.6  NEUTROABS 4.0 3.7  --  4.5  --   HGB 11.7* 12.4 11.3* 11.7* 11.8*  HCT 37.9 40.1 37.2 39.4 37.7  MCV 88.6 90.9 90.5 91.0 86.3  PLT 212 202 211 173 193     Basic Metabolic Panel: Recent Labs  Lab 10/10/22 0020 10/10/22 0220 10/10/22 0817 10/11/22 0820 10/11/22 0932 10/12/22 0829  NA 140 141  --  143  --  138  K 2.9* 3.1*  --  4.0  --  3.6  CL 106 106  --  107  --  105  CO2 24 24  --  21*  --  22  GLUCOSE 92 95  --  60*  --  90  BUN 8 8  --  12  --  7  CREATININE 0.59 0.67 0.64 0.78  --  0.61  CALCIUM 10.1 9.7  --  9.7  --  9.7  MG  --  1.4*  --   --  2.0 1.9     GFR: Estimated Creatinine Clearance: 72.3 mL/min (by C-G formula based on SCr of 0.61 mg/dL).  Liver Function Tests: Recent Labs  Lab 10/10/22 0020 10/10/22 0220  AST 17 17  ALT 12 12  ALKPHOS 77 76  BILITOT 0.8 0.9  PROT 6.9 6.9  ALBUMIN 3.6 3.5     CBG: No results for input(s): "GLUCAP" in the last 168 hours.   Recent Results (from the past 240 hour(s))  Urine Culture     Status: Abnormal   Collection Time: 10/10/22  1:30 AM   Specimen: Urine, Clean Catch  Result Value Ref Range Status   Specimen Description URINE, CLEAN CATCH  Final   Special Requests   Final    NONE Performed at [facility], [address] [place], [place] [address]    Culture >=100,000 COLONIES/mL KLEBSIELLA PNEUMONIAE (A)  Final   Report Status 10/12/2022 FINAL  Final   Organism ID, Bacteria KLEBSIELLA PNEUMONIAE (A)  Final      Susceptibility   Klebsiella pneumoniae - MIC*    AMPICILLIN >=32 RESISTANT Resistant     CEFAZOLIN <=4 SENSITIVE Sensitive     CEFEPIME <=0.12 SENSITIVE Sensitive     CEFTRIAXONE <=0.25  SENSITIVE Sensitive     CIPROFLOXACIN <=0.25 SENSITIVE Sensitive     GENTAMICIN <=1 SENSITIVE Sensitive     IMIPENEM <=0.25 SENSITIVE Sensitive     NITROFURANTOIN 128 RESISTANT Resistant     TRIMETH/SULFA <=20 SENSITIVE Sensitive     AMPICILLIN/SULBACTAM 4 SENSITIVE Sensitive     PIP/TAZO <=4 SENSITIVE Sensitive     * >=100,000 COLONIES/mL KLEBSIELLA PNEUMONIAE         Radiology Studies: DG Abd Portable 1V-Small Bowel Obstruction Protocol-24 hr delay  Result Date: 10/11/2022 CLINICAL DATA:  Evaluate nasogastric tube placement. EXAM: PORTABLE ABDOMEN - 1 VIEW COMPARISON:  10/10/2022 FINDINGS: Enteric tube is identified with tip and side port in the stomach. Surgical clips noted in the right upper quadrant of the abdomen. No dilated loops of large or small bowel. Gas noted up to the level of the rectum. IMPRESSION: Enteric tube tip and side port are in the stomach. Electronically Signed   By: Signa Kell M.D.   On: 10/11/2022 07:10   DG Abd Portable 1V-Small Bowel Obstruction Protocol-initial, 8 hr delay  Result Date: 10/10/2022 CLINICAL DATA:  Small-bowel obstruction EXAM: PORTABLE ABDOMEN - 1 VIEW COMPARISON:  CT abdomen and pelvis 10/09/2022 FINDINGS: Gaseous distention of the ascending and transverse colon. The small bowel is not well visualized likely due intraluminal fluid. There is a mildly dilated loop of small bowel in the left lower quadrant measuring 3.2 cm. Distended bladder filled with contrast. Cholecystectomy clips. No acute osseous abnormality. IMPRESSION: Mildly dilated loop of small bowel in the left lower quadrant measuring 3.2 cm. The remaining small bowel is not well visualized likely due to intraluminal fluid. Electronically Signed   By: Minerva Fester M.D.   On: 10/10/2022 20:58        Scheduled Meds:  enoxaparin (LOVENOX) injection  40 mg Subcutaneous Daily   [START ON 10/13/2022] pantoprazole  40 mg Oral Daily   phenol  1 spray Mouth/Throat TID   potassium  chloride  40 mEq Oral Once   sodium chloride flush  3 mL Intravenous Q12H   Continuous Infusions:  cefTRIAXone (ROCEPHIN)  IV 2 g (10/11/22 2158)     LOS: 2 days  Time spent: 35 minutes    Ramiro Harvest, MD [facility]   To contact the attending provider between 7A-7P or the covering provider during after hours 7P-7A, please log into the web site [email or web address] and access using universal [place] password for that web site. If you do not have the password, please call the hospital operator.  10/12/2022, 10:47 AM

## 2022-10-12 NOTE — Progress Notes (Signed)
Progress Note: General Surgery Service   Chief Complaint/Subjective: Passing some flatus, no bowel movement  Objective: Vital signs in last 24 hours: Temp:  [97.8 F (36.6 C)-98 F (36.7 C)] 97.8 F (36.6 C) (06/06 0747) Pulse Rate:  [76-88] 80 (06/06 0747) Resp:  [18] 18 (06/06 0747) BP: (128-136)/(76-95) 136/95 (06/06 0747) SpO2:  [98 %-100 %] 100 % (06/06 0747) Last BM Date : 10/07/22  Intake/Output from previous day: 06/05 0701 - 06/06 0700 In: 3 [I.V.:3] Out: -  Intake/Output this shift: No intake/output data recorded.  Constitutional: NAD; conversant; no deformities Eyes: Moist conjunctiva; no lid lag; anicteric; PERRL Neck: Trachea midline; no thyromegaly Lungs: Normal respiratory effort; no tactile fremitus CV: RRR; no palpable thrills; no pitting edema GI: Abd Soft, nontender; no palpable hepatosplenomegaly MSK: Normal range of motion of extremities; no clubbing/cyanosis Psychiatric: Appropriate affect; alert and oriented x3 Lymphatic: No palpable cervical or axillary lymphadenopathy  Lab Results: CBC  Recent Labs    10/10/22 0817 10/11/22 0932  WBC 7.6 6.7  HGB 11.3* 11.7*  HCT 37.2 39.4  PLT 211 173    BMET Recent Labs    10/10/22 0220 10/10/22 0817 10/11/22 0820  NA 141  --  143  K 3.1*  --  4.0  CL 106  --  107  CO2 24  --  21*  GLUCOSE 95  --  60*  BUN 8  --  12  CREATININE 0.67 0.64 0.78  CALCIUM 9.7  --  9.7    PT/INR No results for input(s): "LABPROT", "INR" in the last 72 hours. ABG No results for input(s): "PHART", "HCO3" in the last 72 hours.  Invalid input(s): "PCO2", "PO2"  Anti-infectives: Anti-infectives (From admission, onward)    Start     Dose/Rate Route Frequency Ordered Stop   10/11/22 2200  cefTRIAXone (ROCEPHIN) 2 g in sodium chloride 0.9 % 100 mL IVPB        2 g 200 mL/hr over 30 Minutes Intravenous Every 24 hours 10/11/22 0831     10/10/22 2200  cefTRIAXone (ROCEPHIN) 1 g in sodium chloride 0.9 % 100 mL IVPB   Status:  Discontinued        1 g 200 mL/hr over 30 Minutes Intravenous Every 24 hours 10/10/22 0652 10/11/22 0831   10/10/22 0200  cefTRIAXone (ROCEPHIN) 1 g in sodium chloride 0.9 % 100 mL IVPB        1 g 200 mL/hr over 30 Minutes Intravenous  Once 10/10/22 0159 10/10/22 0228       Medications: Scheduled Meds:  enoxaparin (LOVENOX) injection  40 mg Subcutaneous Daily   pantoprazole (PROTONIX) IV  40 mg Intravenous Q24H   phenol  1 spray Mouth/Throat TID   sodium chloride flush  3 mL Intravenous Q12H   Continuous Infusions:  cefTRIAXone (ROCEPHIN)  IV 2 g (10/11/22 2158)   PRN Meds:.acetaminophen **OR** acetaminophen, HYDROmorphone (DILAUDID) injection, ketorolac, naLOXone (NARCAN)  injection, ondansetron (ZOFRAN) IV, phenol  Assessment/Plan: Ms. Eaken has a SBO  Soft diet If she has a bowel movement she can go home today   LOS: 2 days    Quentin Ore, MD  [facility] Use [email or web address] to contact on call provider  Daily Billing: [phone]

## 2022-10-12 NOTE — Progress Notes (Signed)
PHARMACIST - PHYSICIAN COMMUNICATION ? ?DR:   Thompson ? ?CONCERNING: IV to Oral Route Change Policy ? ?RECOMMENDATION: ?This patient is receiving protonix by the intravenous route.  Based on criteria approved by the Pharmacy and Therapeutics Committee, the intravenous medication(s) is/are being converted to the equivalent oral dose form(s). ? ? ?DESCRIPTION: ?These criteria include: ?The patient is eating (either orally or via tube) and/or has been taking other orally administered medications for a least 24 hours ?The patient has no evidence of active gastrointestinal bleeding or impaired GI absorption (gastrectomy, short bowel, patient on TNA or NPO). ? ?If you have questions about this conversion, please contact the Pharmacy Department  ?[]  ( [phone] )  [place] ?[]  ( [phone] )  [facility] ?[x]  ( [phone] )  [place] ?[]  ( [phone] )  [facility] ?[]  ( [phone] )  [facility]  ? ? ? ? ?

## 2022-10-12 NOTE — Progress Notes (Signed)
Ok to change ceftriaxone to cefadroxil 1g PO BID to complete 5d per Dr Janee Morn.  Ulyses Southward, PharmD, BCIDP, AAHIVP, CPP Infectious Disease Pharmacist 10/12/2022 11:02 AM

## 2022-10-13 ENCOUNTER — Other Ambulatory Visit (HOSPITAL_COMMUNITY): Payer: Self-pay

## 2022-10-13 DIAGNOSIS — I5032 Chronic diastolic (congestive) heart failure: Secondary | ICD-10-CM | POA: Diagnosis not present

## 2022-10-13 DIAGNOSIS — Z0279 Encounter for issue of other medical certificate: Secondary | ICD-10-CM

## 2022-10-13 DIAGNOSIS — N3001 Acute cystitis with hematuria: Secondary | ICD-10-CM | POA: Diagnosis not present

## 2022-10-13 DIAGNOSIS — E860 Dehydration: Secondary | ICD-10-CM | POA: Diagnosis not present

## 2022-10-13 DIAGNOSIS — K56609 Unspecified intestinal obstruction, unspecified as to partial versus complete obstruction: Secondary | ICD-10-CM | POA: Diagnosis not present

## 2022-10-13 MED ORDER — PANTOPRAZOLE SODIUM 40 MG PO TBEC: 80.0000 mg | DELAYED_RELEASE_TABLET | Freq: Every day | ORAL | Status: DC

## 2022-10-13 MED ORDER — NINTEDANIB ESYLATE 150 MG PO CAPS: 150.0000 mg | ORAL_CAPSULE | Freq: Two times a day (BID) | ORAL | Status: DC

## 2022-10-13 MED ORDER — MACITENTAN 10 MG PO TABS
10.0000 mg | ORAL_TABLET | Freq: Every day | ORAL | Status: DC
  Filled 2022-10-13: qty 1

## 2022-10-13 MED ORDER — SODIUM CHLORIDE 0.9 % IV SOLN
2.0000 g | Freq: Once | INTRAVENOUS | Status: AC
  Administered 2022-10-13: 2 g via INTRAVENOUS
  Filled 2022-10-13: qty 20

## 2022-10-13 MED ORDER — CEPHALEXIN 250 MG/5ML PO SUSR
500.0000 mg | Freq: Two times a day (BID) | ORAL | 0 refills | Status: AC
  Filled 2022-10-13: qty 100, 4d supply, fill #0

## 2022-10-13 MED ORDER — CEPHALEXIN 250 MG/5ML PO SUSR
500.0000 mg | Freq: Two times a day (BID) | ORAL | 0 refills | Status: DC
  Filled 2022-10-13: qty 100, 5d supply, fill #0

## 2022-10-13 MED ORDER — TRIAMCINOLONE ACETONIDE 0.1 % EX CREA: 1.0000 | TOPICAL_CREAM | Freq: Every day | CUTANEOUS | Status: DC | PRN

## 2022-10-13 MED ORDER — MYCOPHENOLATE MOFETIL 250 MG PO CAPS
1500.0000 mg | ORAL_CAPSULE | Freq: Two times a day (BID) | ORAL | Status: DC
  Filled 2022-10-13 (×2): qty 6

## 2022-10-13 MED ORDER — CEPHALEXIN 250 MG/5ML PO SUSR: 500.0000 mg | Freq: Two times a day (BID) | ORAL | Status: DC

## 2022-10-13 MED ORDER — MACITENTAN 10 MG PO TABS: 10.0000 mg | ORAL_TABLET | Freq: Every day | ORAL | Status: DC

## 2022-10-13 MED ORDER — THYROID 60 MG PO TABS
120.0000 mg | ORAL_TABLET | Freq: Every day | ORAL | Status: DC
  Filled 2022-10-13 (×2): qty 2

## 2022-10-13 MED ORDER — PANTOPRAZOLE SODIUM 40 MG PO TBEC
80.0000 mg | DELAYED_RELEASE_TABLET | Freq: Every day | ORAL | Status: DC
  Filled 2022-10-13: qty 2

## 2022-10-13 NOTE — Progress Notes (Signed)
Transition of Care [facility]) - Inpatient Brief Assessment   Patient Details  Name: Karen [name] MRN: [id number] Date of Birth: 31-Jan-1971  Transition of Care [facility]) CM/SW Contact:    Janae Bridgeman, RN Phone Number: 10/13/2022, 3:26 PM   Clinical Narrative: Patient is planning to discharge home - No TOC needs noted.   Transition of Care Asessment: Insurance and Status: (P) Insurance coverage has been reviewed Patient has primary care physician: (P) Yes Home environment has been reviewed: (P) Yes Prior level of function:: (P) Independent Prior/Current Home Services: (P) No current home services Social Determinants of Health Reivew: (P) SDOH reviewed no interventions necessary Readmission risk has been reviewed: (P) Yes Transition of care needs: (P) no transition of care needs at this time

## 2022-10-13 NOTE — Progress Notes (Signed)
Progress Note: General Surgery Service   Chief Complaint/Subjective: Did better with liquids than solids.  Objective: Vital signs in last 24 hours: Temp:  [97.5 F (36.4 C)-98.2 F (36.8 C)] 98 F (36.7 C) (06/07 0728) Pulse Rate:  [70-80] 77 (06/07 0728) Resp:  [18] 18 (06/07 0728) BP: (106-124)/(73-85) 122/78 (06/07 0728) SpO2:  [96 %-100 %] 96 % (06/07 0728) Last BM Date : 10/07/22  Intake/Output from previous day: 06/06 0701 - 06/07 0700 In: -  Out: 500 [Urine:500] Intake/Output this shift: No intake/output data recorded.  Constitutional: NAD; conversant; no deformities Eyes: Moist conjunctiva; no lid lag; anicteric; PERRL Neck: Trachea midline; no thyromegaly Lungs: Normal respiratory effort; no tactile fremitus CV: RRR; no palpable thrills; no pitting edema GI: Abd Soft, nontender; no palpable hepatosplenomegaly MSK: Normal range of motion of extremities; no clubbing/cyanosis Psychiatric: Appropriate affect; alert and oriented x3 Lymphatic: No palpable cervical or axillary lymphadenopathy  Lab Results: CBC  Recent Labs    10/11/22 0932 10/12/22 0829  WBC 6.7 5.6  HGB 11.7* 11.8*  HCT 39.4 37.7  PLT 173 193    BMET Recent Labs    10/11/22 0820 10/12/22 0829  NA 143 138  K 4.0 3.6  CL 107 105  CO2 21* 22  GLUCOSE 60* 90  BUN 12 7  CREATININE 0.78 0.61  CALCIUM 9.7 9.7    PT/INR No results for input(s): "LABPROT", "INR" in the last 72 hours. ABG No results for input(s): "PHART", "HCO3" in the last 72 hours.  Invalid input(s): "PCO2", "PO2"  Anti-infectives: Anti-infectives (From admission, onward)    Start     Dose/Rate Route Frequency Ordered Stop   10/12/22 2100  cefadroxil (DURICEF) capsule 1,000 mg        1,000 mg Oral 2 times daily 10/12/22 1100 10/16/22 0959   10/12/22 0000  cefadroxil (DURICEF) 500 MG capsule        1,000 mg Oral 2 times daily 10/12/22 1507 10/16/22 2359   10/11/22 2200  cefTRIAXone (ROCEPHIN) 2 g in sodium  chloride 0.9 % 100 mL IVPB  Status:  Discontinued        2 g 200 mL/hr over 30 Minutes Intravenous Every 24 hours 10/11/22 0831 10/12/22 1100   10/10/22 2200  cefTRIAXone (ROCEPHIN) 1 g in sodium chloride 0.9 % 100 mL IVPB  Status:  Discontinued        1 g 200 mL/hr over 30 Minutes Intravenous Every 24 hours 10/10/22 0652 10/11/22 0831   10/10/22 0200  cefTRIAXone (ROCEPHIN) 1 g in sodium chloride 0.9 % 100 mL IVPB        1 g 200 mL/hr over 30 Minutes Intravenous  Once 10/10/22 0159 10/10/22 0228       Medications: Scheduled Meds:  cefadroxil  1,000 mg Oral BID   enoxaparin (LOVENOX) injection  40 mg Subcutaneous Daily   pantoprazole  40 mg Oral Daily   phenol  1 spray Mouth/Throat TID   sodium chloride flush  3 mL Intravenous Q12H   Continuous Infusions:   PRN Meds:.acetaminophen **OR** acetaminophen, HYDROmorphone (DILAUDID) injection, ketorolac, naLOXone (NARCAN)  injection, ondansetron (ZOFRAN) IV, phenol  Assessment/Plan: Ms. Goding has a SBO  Did better with liquids than solids May just be taking a little longer to open up Did have a watery bowel movement yesterday. Recommend discharge on full liquid diet for two weeks or so and slowly advancing her diet as she feels fit.  If she has issues she can always return to the hospital, but  hopefully with a slower diet advancement her symptoms will resolve.   LOS: 3 days    Quentin Ore, MD  [facility] Use [email or web address] to contact on call provider  Daily Billing: [phone]

## 2022-10-13 NOTE — Progress Notes (Signed)
Monitoring by Pharmacy for Pulmonary Hypertension Treatment   Indication - Continuation of prior to admission medication   Patient is 52 y.o.  with history of PAH on chronic macitentan (OPSUMIT) PTA and will be continued while hospitalized.   Continuing this medication order as an inpatient requires that monitoring parameters per REMS requirements must be met.  Chronic therapy is under the supervision of Bensimhon who is enrolled in the REMS program and is being notified of continuation of therapy. A staff message in EPIC has been sent notifying the certified prescriber.  Per patient report has previously been educated on Pregnancy risk and Hepatotoxicity. On admission pregnancy risk has been assessed and pregnancy test dated 06/06/02 was negative.  Hepatic function has been evaluated. AST / ALT appropriate to continue medication at this time.     Latest Ref Rng & Units 10/10/2022    2:20 AM 10/10/2022   12:20 AM 06/06/2022    2:39 PM  Hepatic Function  Total Protein 6.5 - 8.1 g/dL 6.9  6.9  8.6   Albumin 3.5 - 5.0 g/dL 3.5  3.6  4.8   AST 15 - 41 U/L 17  17  23    ALT 0 - 44 U/L 12  12  16    Alk Phosphatase 38 - 126 U/L 76  77  99   Total Bilirubin 0.3 - 1.2 mg/dL 0.9  0.8  0.7   Bilirubin, Direct 0.0 - 0.2 mg/dL   0.1     If any question arise or pregnancy is identified during hospitalization, contact for bosentan: [phone]; macitentan: [phone]; ambrisentan: [phone].  Ulyses Southward, PharmD, BCIDP, AAHIVP, CPP Infectious Disease Pharmacist 10/13/2022 10:03 AM    Guides for Female Patient:  ambrisentan (LETAIRIS), macitentan (OPSUMIT), bosentan (TRACLEER).

## 2022-10-13 NOTE — Discharge Summary (Signed)
Physician Discharge Summary  Karen [name] WJX:[id number] DOB: 02/07/1971 DOA: 10/09/2022  PCP: Anabel Halon, MD  Admit date: 10/09/2022 Discharge date: 10/13/2022  Time spent: 60 minutes  Recommendations for Outpatient Follow-up:  Follow-up with Anabel Halon, MD in 2 weeks.  On follow-up patient will need a basic metabolic profile as well as a magnesium level done to follow-up on electrolytes and renal function.  Small bowel obstruction need to be followed up upon. Follow-up with Dr.Stechschulte, general surgery in 3 weeks.   Discharge Diagnoses:  Principal Problem:   SBO (small bowel obstruction) (HCC) Active Problems:   Acute cystitis with hematuria   Dehydration   Hypomagnesemia   Chronic diastolic CHF (congestive heart failure) (HCC)   Hypothyroidism   Interstitial lung disease (HCC)   Discharge Condition: Stable and improved.  Diet recommendation: Full liquid diet for 1 to 2 weeks, then advance as tolerated to a soft diet.  Filed Weights   10/09/22 2129  Weight: 61.8 kg    History of present illness:  HPI per Dr. Huel Coventry is a 52 y.o. female with medical history significant of scleroderma, GERD, interstitial lung disease, mild pulmonary hypertension, gastroparesis.  She has also had prior cholecystectomy and abdominal hernia repair in the past.  Patient presented to the ED from [facility] after being sent to that facility to obtain an outpatient CT for nausea and vomiting.  She was subsequently sent to the [facility], ED due to abnormalities found on that CT.   Upon presentation to the ED she was afebrile and normotensive.  At rest her O2 sats were 94%.  Her labs were unremarkable except for potassium of 2.9.  Her urine was also abnormal with hazy appearance, small hemoglobin, large leukocytes, positive nitrite, 30 of protein, specific gravity greater than 1.046 and greater than 50 WBCs.  Review of the previously obtained CT revealed partial  small bowel obstruction with a transition point in the anterior abdomen just left of the midline suspected related to adhesions.  Due to recurrent emesis and NG tube has been placed by the EDP and there is currently about 300 cc thick green fluid noted in the collection canister.   Upon my evaluation of the patient she has been having waxing and waning symptoms of intermittent nonfocal abdominal pain with bloating for several weeks.  Yesterday she was unable to pass any flatus and developed abrupt abdominal pain with emesis.  Hospitalist service was asked to evaluate the patient for admission.  Hospital Course:  #1 small bowel obstruction -Likely secondary to adhesions, patient with history of prior abdominal surgeries and scleroderma. -CT abdomen and pelvis done with findings consistent with small bowel obstruction with transition point in the anterior abdomen left of the midline likely related to adhesions. -NG tube placed, on admission, patient underwent a clamping trial 10/11/2022 and NG tube subsequently has been removed/discontinued by general surgery.   -Patient placed on a clear liquid diet advanced to a full liquid diet which patient tolerated.  Patient subsequently advanced to a soft diet however developed some nausea and seem to have tolerated full liquids better.   -Diet currently changed back to a full liquid diet.   -Patient noted to have some watery stools.   -Patient followed by general surgery throughout the hospitalization and recommended discharge on a full liquid diet for 2 weeks also and slowly advancing her diet as patient feels fit to a soft diet.   -Patient was discharged in stable improved  condition, outpatient follow-up with PCP and general surgery.     2.  Dehydration -Hydrated with IV fluids during the hospitalization.    3.  Hypokalemia/hypomagnesemia -Repleted.   4.  Klebsiella pneumoniae UTI -Urine cultures with > 100,000 colonies of Klebsiella  pneumoniae. -Cultures sensitive to the cephalosporins, fluoroquinolones, gentamicin, imipenem, Bactrim, Unasyn and Zosyn.  Resistant to nitrofurantoin and ampicillin. -Patient on IV Rocephin during the hospitalization, initially transition to oral Duricef however unable to tolerate pills and subsequently placed on Keflex elixir.  Patient will be discharged on Keflex twice daily for 4 more days to complete a 5-day course of antibiotic treatment.    5.  Scleroderma/mild pulmonary hypertension/interstitial lung disease - WHO group 1 and 3 -Noted to be followed by pulmonary medicine and cardiology outpatient setting at [facility]. -Echocardiogram in 2023 with EF of 70 to 75%, moderate LVH. -Patient underwent right heart cath which confirmed mild pulmonary hypertension. -Cardio patient's macitentan, O FEV and CellCept all of which were held during the hospitalization as patient was n.p.o. and due to patient's presentation with SBO.  -Patient's diet advanced from clears to full liquids which she tolerated and subsequently to a soft diet.  Patient however did better with liquids than solids and diet changed back to a full liquid diet.  Patient's medications were resumed on day of discharge.   -Outpatient follow-up.   -Followed as well in the lung transplant clinic.   6.  Chronic diastolic CHF -Noted on Lasix 40 mg Monday Wednesday Friday and as needed. -Patient's medications of Norvasc, bisoprolol, Aldactone and Lasix were held during the hospitalization and will be resumed on discharge.    7.  Hypothyroidism -Patient's thyroid medication was held during the hospitalization as patient was n.p.o. and then presented with small bowel obstruction.  As patient improved clinically and started on a diet, thyroid medication was resumed on day of discharge.   -Outpatient follow-up with PCP.    Procedures: CT abdomen and pelvis 10/09/2022 Chest x-ray 10/10/2022 Small bowel obstruction  protocol  Consultations: General surgery: Dr.Stechschulte 10/10/2022   Discharge Exam: Vitals:   10/13/22 0728 10/13/22 1126  BP: 122/78 114/74  Pulse: 77 71  Resp: 18 18  Temp: 98 F (36.7 C) 97.6 F (36.4 C)  SpO2: 96% 100%    General: NAD Cardiovascular: RRR no murmurs rubs or gallops.  No JVD.  No lower extremity edema. Respiratory: Clear to auscultation bilaterally.  No wheezes, no crackles, no rhonchi.  Fair air movement.  Speaking in full sentences.  Discharge Instructions   Discharge Instructions     Diet - low sodium heart healthy   Complete by: As directed    Diet full liquid   Complete by: As directed    Full liquid diet for 1 to 2 weeks and advance as tolerated to a soft diet.   Increase activity slowly   Complete by: As directed    Increase activity slowly   Complete by: As directed       Allergies as of 10/13/2022       Reactions   Lisinopril Hives, Swelling, Other (See Comments)   Tocilizumab Other (See Comments)   Swelling and shortness of breath sharpe pains in back and chest tightness.        Medication List     TAKE these medications    acetaminophen 325 MG tablet Commonly known as: TYLENOL Take 2 tablets (650 mg total) by mouth every 6 (six) hours as needed for mild pain, fever or headache (or  Fever >/= 101).   amLODipine 10 MG tablet Commonly known as: NORVASC Take 1 tablet (10 mg total) by mouth daily.   benzonatate 100 MG capsule Commonly known as: TESSALON Take 1 capsule (100 mg total) by mouth 2 (two) times daily as needed for cough.   bisoprolol 5 MG tablet Commonly known as: ZEBETA Take 1 tablet (5 mg total) by mouth daily.   cephALEXin 250 MG/5ML suspension Commonly known as: KEFLEX Take 10 mLs (500 mg total) by mouth every 12 (twelve) hours for 8 doses. Start taking on: October 14, 2022   clobetasol cream 0.05 % Commonly known as: TEMOVATE Apply 1 Application topically daily as needed (For rash).   esomeprazole 40 MG  capsule Commonly known as: NEXIUM Take 1 capsule (40 mg total) by mouth 2 (two) times daily.   Fluocinolone Acetonide Scalp 0.01 % Oil Apply to affected frontal scalp 4 (four) times a week. Not to face   furosemide 40 MG tablet Commonly known as: LASIX Take 1 tablet (40 mg total) by mouth 3 (three) times a week. Mon, Wed, Fri What changed:  when to take this reasons to take this   macitentan 10 MG tablet Commonly known as: OPSUMIT Take 1 tablet (10 mg total) by mouth daily.   meloxicam 15 MG tablet Commonly known as: MOBIC Take 1 tablet (15 mg total) by mouth daily as needed. What changed: reasons to take this   multivitamin with minerals Tabs tablet Take 1 tablet by mouth daily.   mycophenolate 500 MG tablet Commonly known as: CELLCEPT Take 3 tablets (1,500 mg total) by mouth every morning AND 3 tablets (1,500 mg total) every evening.   NP Thyroid 120 MG tablet Generic drug: thyroid Take 1 tablet (120 mg total) by mouth daily before breakfast.   Ofev 150 MG Caps Generic drug: Nintedanib Take 1 capsule (150 mg total) by mouth 2 (two) times daily.   ondansetron 4 MG tablet Commonly known as: Zofran Take 1 tablet (4 mg total) by mouth every 8 (eight) hours as needed for nausea or vomiting.   potassium chloride 20 MEQ/15ML (10%) Soln Take 15 mLs (20 mEq total) by mouth daily with furosemide What changed:  when to take this reasons to take this   spironolactone 25 MG tablet Commonly known as: ALDACTONE Take 1 tablet (25 mg total) by mouth 2 (two) times daily.   triamcinolone cream 0.1 % Commonly known as: KENALOG Apply 1 Application topically daily as needed (For rash).       Allergies  Allergen Reactions   Lisinopril Hives, Swelling and Other (See Comments)   Tocilizumab Other (See Comments)    Swelling and shortness of breath sharpe pains in back and chest tightness.    Follow-up Information     Anabel Halon, MD. Schedule an appointment as soon as  possible for a visit in 2 week(s).   Specialty: Internal Medicine Contact information: [address] [place] [address] [phone]         Stechschulte, Hyman Hopes, MD. Schedule an appointment as soon as possible for a visit in 3 week(s).   Specialty: Surgery Contact information: [address] [place] [address] [phone]                  The results of significant diagnostics from this hospitalization (including imaging, microbiology, ancillary and laboratory) are listed below for reference.    Significant Diagnostic Studies: DG Abd Portable 1V-Small Bowel Obstruction Protocol-24 hr delay  Result Date: 10/11/2022 CLINICAL DATA:  Evaluate nasogastric tube placement. EXAM: PORTABLE ABDOMEN - 1 VIEW COMPARISON:  10/10/2022 FINDINGS: Enteric tube is identified with tip and side port in the stomach. Surgical clips noted in the right upper quadrant of the abdomen. No dilated loops of large or small bowel. Gas noted up to the level of the rectum. IMPRESSION: Enteric tube tip and side port are in the stomach. Electronically Signed   By: Signa Kell M.D.   On: 10/11/2022 07:10   DG Abd Portable 1V-Small Bowel Obstruction Protocol-initial, 8 hr delay  Result Date: 10/10/2022 CLINICAL DATA:  Small-bowel obstruction EXAM: PORTABLE ABDOMEN - 1 VIEW COMPARISON:  CT abdomen and pelvis 10/09/2022 FINDINGS: Gaseous distention of the ascending and transverse colon. The small bowel is not well visualized likely due intraluminal fluid. There is a mildly dilated loop of small bowel in the left lower quadrant measuring 3.2 cm. Distended bladder filled with contrast. Cholecystectomy clips. No acute osseous abnormality. IMPRESSION: Mildly dilated loop of small bowel in the left lower quadrant measuring 3.2 cm. The remaining small bowel is not well visualized likely due to intraluminal fluid. Electronically Signed   By: Minerva Fester M.D.   On: 10/10/2022 20:58   DG Chest  Portable 1 View  Result Date: 10/10/2022 CLINICAL DATA:  Check gastric catheter placement EXAM: PORTABLE CHEST 1 VIEW COMPARISON:  None Available. FINDINGS: Gastric catheter extends into the stomach. Cardiac shadow is prominent but accentuated by the portable technique. Bibasilar atelectatic changes are noted. This may be related to the poor inspiratory effort. IMPRESSION: Gastric catheter within the stomach. Bibasilar atelectasis likely related to a poor inspiratory effort. Electronically Signed   By: Alcide Clever M.D.   On: 10/10/2022 02:27   CT Abdomen Pelvis W Contrast  Result Date: 10/09/2022 CLINICAL DATA:  Abdominal pain EXAM: CT ABDOMEN AND PELVIS WITH CONTRAST TECHNIQUE: Multidetector CT imaging of the abdomen and pelvis was performed using the standard protocol following bolus administration of intravenous contrast. RADIATION DOSE REDUCTION: This exam was performed according to the departmental dose-optimization program which includes automated exposure control, adjustment of the mA and/or kV according to patient size and/or use of iterative reconstruction technique. CONTRAST:  OMNIPAQUE IOHEXOL 300 MG/ML  SOLN COMPARISON:  06/06/2022 FINDINGS: Lower chest: Mild subpleural fibrotic changes are noted. Hepatobiliary: No focal liver abnormality is seen. Status post cholecystectomy. No biliary dilatation. Pancreas: Unremarkable. No pancreatic ductal dilatation or surrounding inflammatory changes. Spleen: Normal in size without focal abnormality. Adrenals/Urinary Tract: Adrenal glands are within normal limits. Kidneys demonstrate a normal enhancement pattern bilaterally. No obstructive changes are seen. Bilateral renal calculi are noted measuring up to 3 mm on the left. No ureteral stones are noted. The bladder is decompressed. Stomach/Bowel: Colon is predominately decompressed. The appendix is within normal limits. Stomach is decompressed. Multiple dilated fluid-filled loops of small bowel are noted.  Transition point appears to lie within the mid abdomen anteriorly and is likely related to adhesions. Distal small bowel appears within normal limits. Vascular/Lymphatic: Aortic atherosclerosis. No enlarged abdominal or pelvic lymph nodes. Reproductive: Uterine fibroids are noted largest of which measures up to 7.7 cm. No adnexal mass is seen. Other: No significant free fluid is noted.  No free air is seen. Musculoskeletal: No acute bony abnormality is noted. IMPRESSION: Changes consistent with partial small bowel obstruction. Transition point appears to lie in the anterior abdomen just to the left of the midline. This is likely related to adhesions. Remainder of the exam is stable with  chronic findings of uterine fibroids and nonobstructing renal calculi. Aortic Atherosclerosis (ICD10-I70.0). Electronically Signed   By: Alcide Clever M.D.   On: 10/09/2022 19:20    Microbiology: Recent Results (from the past 240 hour(s))  Urine Culture     Status: Abnormal   Collection Time: 10/10/22  1:30 AM   Specimen: Urine, Clean Catch  Result Value Ref Range Status   Specimen Description URINE, CLEAN CATCH  Final   Special Requests   Final    NONE Performed at [facility], [address], [place], [place] [address]    Culture >=100,000 COLONIES/mL KLEBSIELLA PNEUMONIAE (A)  Final   Report Status 10/12/2022 FINAL  Final   Organism ID, Bacteria KLEBSIELLA PNEUMONIAE (A)  Final      Susceptibility   Klebsiella pneumoniae - MIC*    AMPICILLIN >=32 RESISTANT Resistant     CEFAZOLIN <=4 SENSITIVE Sensitive     CEFEPIME <=0.12 SENSITIVE Sensitive     CEFTRIAXONE <=0.25 SENSITIVE Sensitive     CIPROFLOXACIN <=0.25 SENSITIVE Sensitive     GENTAMICIN <=1 SENSITIVE Sensitive     IMIPENEM <=0.25 SENSITIVE Sensitive     NITROFURANTOIN 128 RESISTANT Resistant     TRIMETH/SULFA <=20 SENSITIVE Sensitive     AMPICILLIN/SULBACTAM 4 SENSITIVE Sensitive     PIP/TAZO <=4 SENSITIVE Sensitive     * >=100,000  COLONIES/mL KLEBSIELLA PNEUMONIAE     Labs: Basic Metabolic Panel: Recent Labs  Lab 10/10/22 0020 10/10/22 0220 10/10/22 0817 10/11/22 0820 10/11/22 0932 10/12/22 0829  NA 140 141  --  143  --  138  K 2.9* 3.1*  --  4.0  --  3.6  CL 106 106  --  107  --  105  CO2 24 24  --  21*  --  22  GLUCOSE 92 95  --  60*  --  90  BUN 8 8  --  12  --  7  CREATININE 0.59 0.67 0.64 0.78  --  0.61  CALCIUM 10.1 9.7  --  9.7  --  9.7  MG  --  1.4*  --   --  2.0 1.9   Liver Function Tests: Recent Labs  Lab 10/10/22 0020 10/10/22 0220  AST 17 17  ALT 12 12  ALKPHOS 77 76  BILITOT 0.8 0.9  PROT 6.9 6.9  ALBUMIN 3.6 3.5   Recent Labs  Lab 10/10/22 0020  LIPASE 22   No results for input(s): "AMMONIA" in the last 168 hours. CBC: Recent Labs  Lab 10/10/22 0020 10/10/22 0220 10/10/22 0817 10/11/22 0932 10/12/22 0829  WBC 7.2 7.2 7.6 6.7 5.6  NEUTROABS 4.0 3.7  --  4.5  --   HGB 11.7* 12.4 11.3* 11.7* 11.8*  HCT 37.9 40.1 37.2 39.4 37.7  MCV 88.6 90.9 90.5 91.0 86.3  PLT 212 202 211 173 193   Cardiac Enzymes: No results for input(s): "CKTOTAL", "CKMB", "CKMBINDEX", "TROPONINI" in the last 168 hours. BNP: BNP (last 3 results) Recent Labs    06/06/22 1439  BNP 52.0    ProBNP (last 3 results) No results for input(s): "PROBNP" in the last 8760 hours.  CBG: No results for input(s): "GLUCAP" in the last 168 hours.     Signed:  Ramiro Harvest MD.  [facility] 10/13/2022, 11:44 AM

## 2022-10-16 ENCOUNTER — Telehealth: Payer: Self-pay

## 2022-10-16 NOTE — Transitions of Care (Post Inpatient/ED Visit) (Signed)
10/16/2022  Name: Karen [name] MRN: [id number] DOB: 02/12/1971  Today's TOC FU Call Status: Today's TOC FU Call Status:: Successful TOC FU Call Competed TOC FU Call Complete Date: 10/16/22  Transition Care Management Follow-up Telephone Call Date of Discharge: 10/13/22 Discharge Facility: [facility]) Type of Discharge: Inpatient Admission Primary Inpatient Discharge Diagnosis:: intestional obstruction How have you been since you were released from the hospital?: Better Any questions or concerns?: No  Items Reviewed: Did you receive and understand the discharge instructions provided?: Yes Medications obtained,verified, and reconciled?: Yes (Medications Reviewed) Any new allergies since your discharge?: No Dietary orders reviewed?: Yes Do you have support at home?: Yes People in Home: other relative(s)  Medications Reviewed Today: Medications Reviewed Today     Reviewed by Karena Addison, LPN (Licensed Practical Nurse) on 10/16/22 at 1344  Med List Status: <None>   Medication Order Taking? Sig Documenting Provider Last Dose Status Informant  acetaminophen (TYLENOL) 325 MG tablet [id number] No Take 2 tablets (650 mg total) by mouth every 6 (six) hours as needed for mild pain, fever or headache (or Fever >/= 101). Shon Hale, MD Past Week Active Self  amLODipine (NORVASC) 10 MG tablet [id number] No Take 1 tablet (10 mg total) by mouth daily. Anabel Halon, MD 10/09/2022 Active Self  benzonatate (TESSALON) 100 MG capsule [id number] No Take 1 capsule (100 mg total) by mouth 2 (two) times daily as needed for cough. Anabel Halon, MD Past Week Active Self  bisoprolol (ZEBETA) 5 MG tablet [id number] No Take 1 tablet (5 mg total) by mouth daily. Bensimhon, Bevelyn Buckles, MD 10/09/2022 0800 Active Self  cephALEXin (KEFLEX) 250 MG/5ML suspension [id number]  Take 10 mLs (500 mg total) by mouth every 12 (twelve) hours for 8 doses. DISCARD REMAINING. Rodolph Bong, MD  Active    clobetasol cream (TEMOVATE) 0.05 % [id number] No Apply 1 Application topically daily as needed (For rash). [provider] Past Week Active Self  esomeprazole (NEXIUM) 40 MG capsule [id number] No Take 1 capsule (40 mg total) by mouth 2 (two) times daily. Aida Raider, NP 10/09/2022 Active Self  Fluocinolone Acetonide Scalp 0.01 % OIL [id number] No Apply to affected frontal scalp 4 (four) times a week. Not to face  Patient not taking: Reported on 10/11/2022    Not Taking Active Self  furosemide (LASIX) 40 MG tablet [id number] No Take 1 tablet (40 mg total) by mouth 3 (three) times a week. Mon, Wed, Fri  Patient taking differently: Take 40 mg by mouth daily as needed for fluid.   Bensimhon, Bevelyn Buckles, MD Past Week Active Self  macitentan (OPSUMIT) 10 MG tablet [id number] No Take 1 tablet (10 mg total) by mouth daily. Bensimhon, Bevelyn Buckles, MD 10/09/2022 Active Self  meloxicam (MOBIC) 15 MG tablet [id number] No Take 1 tablet (15 mg total) by mouth daily as needed.  Patient taking differently: Take 15 mg by mouth daily as needed for pain.    Past Week Active Self  Multiple Vitamin (MULTIVITAMIN WITH MINERALS) TABS tablet [id number] No Take 1 tablet by mouth daily. [provider] 10/09/2022 Active Self  mycophenolate (CELLCEPT) 500 MG tablet [id number] No Take 3 tablets (1,500 mg total) by mouth every morning AND 3 tablets (1,500 mg total) every evening. Chilton Greathouse, MD 10/09/2022 Active Self  Nintedanib (OFEV) 150 MG CAPS [id number] No Take 1 capsule (150 mg total) by mouth 2 (two) times daily. Quentin Angst, MD 10/09/2022 Active Self  NP THYROID 120 MG tablet [id number] No  Take 1 tablet (120 mg total) by mouth daily before breakfast. Anabel Halon, MD Past Month Active Self  ondansetron (ZOFRAN) 4 MG tablet [id number] No Take 1 tablet (4 mg total) by mouth every 8 (eight) hours as needed for nausea or vomiting. Anabel Halon, MD 10/11/2022 Active Self   Discontinued 12/02/20 1502 (External  Source Cancellation)   potassium chloride 20 MEQ/15ML (10%) SOLN [id number] No Take 15 mLs (20 mEq total) by mouth daily with furosemide  Patient taking differently: Take 20 mEq by mouth daily as needed (For low potassium).   Anabel Halon, MD 10/09/2022 Active Self  spironolactone (ALDACTONE) 25 MG tablet [id number] No Take 1 tablet (25 mg total) by mouth 2 (two) times daily. Anabel Halon, MD 10/09/2022 Active Self  triamcinolone cream (KENALOG) 0.1 % [id number] No Apply 1 Application topically daily as needed (For rash). [provider] Past Week Active Self            Home Care and Equipment/Supplies: Were Home Health Services Ordered?: NA Any new equipment or medical supplies ordered?: NA  Functional Questionnaire: Do you need assistance with bathing/showering or dressing?: No Do you need assistance with meal preparation?: No Do you need assistance with eating?: No Do you have difficulty maintaining continence: No Do you need assistance with getting out of bed/getting out of a chair/moving?: No Do you have difficulty managing or taking your medications?: No  Follow up appointments reviewed: PCP Follow-up appointment confirmed?: Yes Date of PCP follow-up appointment?: 10/26/22 Follow-up Provider: [facility] Follow-up appointment confirmed?: NA Do you need transportation to your follow-up appointment?: No Do you understand care options if your condition(s) worsen?: Yes-patient verbalized understanding    SIGNATURE Karena Addison, LPN [facility] Nurse Health Advisor Direct Dial [phone]

## 2022-10-26 ENCOUNTER — Encounter: Payer: Self-pay | Admitting: Internal Medicine

## 2022-10-26 ENCOUNTER — Ambulatory Visit (INDEPENDENT_AMBULATORY_CARE_PROVIDER_SITE_OTHER): Payer: Commercial Managed Care - PPO | Admitting: Internal Medicine

## 2022-10-26 ENCOUNTER — Other Ambulatory Visit (HOSPITAL_COMMUNITY): Payer: Self-pay

## 2022-10-26 VITALS — BP 125/83 | HR 66 | Ht 65.0 in | Wt 135.2 lb

## 2022-10-26 DIAGNOSIS — M349 Systemic sclerosis, unspecified: Secondary | ICD-10-CM

## 2022-10-26 DIAGNOSIS — E876 Hypokalemia: Secondary | ICD-10-CM

## 2022-10-26 DIAGNOSIS — K56609 Unspecified intestinal obstruction, unspecified as to partial versus complete obstruction: Secondary | ICD-10-CM | POA: Diagnosis not present

## 2022-10-26 DIAGNOSIS — K219 Gastro-esophageal reflux disease without esophagitis: Secondary | ICD-10-CM | POA: Diagnosis not present

## 2022-10-26 DIAGNOSIS — N3000 Acute cystitis without hematuria: Secondary | ICD-10-CM | POA: Diagnosis not present

## 2022-10-26 MED ORDER — PANTOPRAZOLE SODIUM 40 MG PO TBEC
40.0000 mg | DELAYED_RELEASE_TABLET | Freq: Every day | ORAL | 1 refills | Status: DC
Start: 2022-10-26 — End: 2022-10-26
  Filled 2022-10-26: qty 90, 90d supply, fill #0

## 2022-10-26 MED ORDER — PANTOPRAZOLE SODIUM 40 MG PO TBEC
40.0000 mg | DELAYED_RELEASE_TABLET | Freq: Two times a day (BID) | ORAL | 1 refills | Status: DC
Start: 2022-10-26 — End: 2023-05-16
  Filled 2022-10-26: qty 180, 90d supply, fill #0
  Filled 2023-02-28: qty 180, 90d supply, fill #1

## 2022-10-26 NOTE — Assessment & Plan Note (Signed)
Followed by Dr. Hawkes Has multiple complications - ILD, pulmonary HTN, esophageal dysphagia and recently SBO 

## 2022-10-26 NOTE — Assessment & Plan Note (Addendum)
On Nexium for GERD, cough could be due to GERD as well Avoid hot and spicy food She had better response to pantoprazole during hospitalization, switch to pantoprazole 40 mg BID for now

## 2022-10-26 NOTE — Assessment & Plan Note (Addendum)
Takes potassium supplement with Lasix Recent hypokalemia likely due to vomiting She also takes spironolactone, which should improve her potassium Check BMP and Mg

## 2022-10-26 NOTE — Assessment & Plan Note (Signed)
Urine culture showed Klebsiella Treated with Rocephin

## 2022-10-26 NOTE — Patient Instructions (Addendum)
Please start taking Miralax at least once daily for constipation.  Okay to advance to solid food and maintain at least 64 ounces of fluid in a day.  Please continue taking Spironolactone as prescribed.  Please start taking Pantoprazole instead of Nexium.

## 2022-10-26 NOTE — Assessment & Plan Note (Signed)
Had abdominal pain, nausea and vomiting X 2 days before admission Checked CT abdomen pelvis with contrast - showed partial SBO, was advised to be admitted S/p NG tube decompression -slowly advance diet from liquids to semisolid, advised to slowly advance to solid food Advised to keep food diary Likely has an effect of scleroderma and adhesions from previous surgeries Has BM now, but loose BM due to mostly liquid diet Needs to maintain adequate hydration Can take MiraLAX as needed for constipation Zofran PRN for nausea

## 2022-10-26 NOTE — Progress Notes (Signed)
Established Patient Office Visit  Subjective:  Patient ID: Karen [name], female    DOB: March 27, 1971  Age: 52 y.o. MRN: [id number]  CC:  Chief Complaint  Patient presents with   Hospitalization Follow-up    Hospital follow up for intestinal blockage    HPI Karen [name] is a 52 y.o. female with past medical history of HTN, scleroderma, ILD, pulmonary hypertension, dysphagia, hypothyroidism, chronic constipation and hot flashes who presents for follow-up after recent hospitalization.  She was admitted with complaint of nausea and vomiting on 10/09/22, as she was found to have SBO from outpatient CT of the abdomen.  She had NG tube placement for gastric decompression, which showed 300 mL of greenish drainage.  She was seen by general surgery team in the hospital.  She was able to have BM while in the hospital with MiraLAX.  She currently denies nausea or vomiting. Of note, she has advance her diet from liquid to semisolid now.  She reports that her BMs are loose mostly.  She has tried multiple OTC stool softeners and laxatives, including Colace, Dulcolax, Benefiber, Metamucil and MiraLAX in the past.  She was also found to have hypokalemia, likely due to vomiting.  She was given potassium supplement.  Of note, she currently takes spironolactone for history of HTN and HFpEF.  She also had Klebsiella UTI, which was treated with Rocephin.  She denies any dysuria, hematuria, fever or chills.  Past Medical History:  Diagnosis Date   Acute respiratory failure with hypoxia due to flash pulmonary edema requiring intubation 02/13/2020   Calculus of gallbladder with acute cholecystitis without obstruction    CHF (congestive heart failure) (HCC)    GERD (gastroesophageal reflux disease)    Hypertension    Hypothyroidism    ILD (interstitial lung disease) (HCC) 02/08/2018   Interstitial lung disease (HCC)    Multinodular thyroid    benign FNA 08/2017.   Perimenopause 02/27/2019   Pulmonary edema     Scleroderma (HCC)    Status post laparoscopic cholecystectomy 02/13/20 02/13/2020   Vitamin D deficiency disease 02/27/2019    Past Surgical History:  Procedure Laterality Date   ABDOMINAL HERNIA REPAIR     BUNIONECTOMY Bilateral 10/2018   CHOLECYSTECTOMY N/A 02/13/2020   Procedure: LAPAROSCOPIC CHOLECYSTECTOMY;  Surgeon: Franky Macho, MD;  Location: [facility];  Service: General;  Laterality: N/A;   COLONOSCOPY WITH PROPOFOL N/A 01/02/2019   Procedure: COLONOSCOPY WITH PROPOFOL;  Surgeon: Corbin Ade, MD;  Location: [facility] END[facility] SUITE;  Service: Endoscopy;  Laterality: N/A;  12:30pm   ESOPHAGOGASTRODUODENOSCOPY (EGD) WITH PROPOFOL N/A 01/28/2018   erosive reflux esophagitis, patulous EG Junction, no dilation, incomplete EGD due to retained food in stomach. GES thereafter with delayed gastric emptying.    RIGHT HEART CATH N/A 09/27/2017   Procedure: RIGHT HEART CATH;  Surgeon: Dolores Patty, MD;  Location: [facility] INVASIVE CV LAB;  Service: Cardiovascular;  Laterality: N/A;   RIGHT HEART CATH N/A 09/05/2019   Procedure: RIGHT HEART CATH;  Surgeon: Dolores Patty, MD;  Location: [facility] INVASIVE CV LAB;  Service: Cardiovascular;  Laterality: N/A;   RIGHT/LEFT HEART CATH AND CORONARY ANGIOGRAPHY N/A 08/18/2021   Procedure: RIGHT/LEFT HEART CATH AND CORONARY ANGIOGRAPHY;  Surgeon: Dolores Patty, MD;  Location: [facility] INVASIVE CV LAB;  Service: Cardiovascular;  Laterality: N/A;   UTERINE FIBROID SURGERY      Family History  Problem Relation Age of Onset   Hypertension Father    Hypertension Sister    Hypertension Brother  Diabetes Maternal Grandfather    Diabetes Maternal Uncle    Diabetes Mother    Colon cancer Neg Hx     Social History   Socioeconomic History   Marital status: Single    Spouse name: Not on file   Number of children: 0   Years of education: Not on file   Highest education level: Associate degree: occupational, Scientist, product/process development, or vocational program  Occupational  History   Occupation: CMA    Employer: [employer]  Tobacco Use   Smoking status: Never   Smokeless tobacco: Never  Vaping Use   Vaping Use: Former  Substance and Sexual Activity   Alcohol use: No   Drug use: No   Sexual activity: Yes  Other Topics Concern   Not on file  Social History Narrative   Patient is right-handed. She lives alone in one level home, a few steps to enter.CMA [employer].   Social Determinants of Health   Financial Resource Strain: Not on file  Food Insecurity: No Food Insecurity (10/10/2022)   Hunger Vital Sign    Worried About Running Out of Food in the Last Year: Never true    Ran Out of Food in the Last Year: Never true  Transportation Needs: No Transportation Needs (10/10/2022)   PRAPARE - Administrator, Civil Service (Medical): No    Lack of Transportation (Non-Medical): No  Physical Activity: Not on file  Stress: Not on file  Social Connections: Not on file  Intimate Partner Violence: Not At Risk (10/10/2022)   Humiliation, Afraid, Rape, and Kick questionnaire    Fear of Current or Ex-Partner: No    Emotionally Abused: No    Physically Abused: No    Sexually Abused: No    Outpatient Medications Prior to Visit  Medication Sig Dispense Refill   acetaminophen (TYLENOL) 325 MG tablet Take 2 tablets (650 mg total) by mouth every 6 (six) hours as needed for mild pain, fever or headache (or Fever >/= 101). 12 tablet 0   amLODipine (NORVASC) 10 MG tablet Take 1 tablet (10 mg total) by mouth daily. 90 tablet 3   benzonatate (TESSALON) 100 MG capsule Take 1 capsule (100 mg total) by mouth 2 (two) times daily as needed for cough. 30 capsule 1   bisoprolol (ZEBETA) 5 MG tablet Take 1 tablet (5 mg total) by mouth daily. 90 tablet 3   clobetasol cream (TEMOVATE) 0.05 % Apply 1 Application topically daily as needed (For rash).     Fluocinolone Acetonide Scalp 0.01 % OIL Apply to affected frontal scalp 4 (four) times a week. Not to  face (Patient not taking: Reported on 10/11/2022) 118.28 mL 7   furosemide (LASIX) 40 MG tablet Take 1 tablet (40 mg total) by mouth 3 (three) times a week. Mon, Wed, Fri (Patient taking differently: Take 40 mg by mouth daily as needed for fluid.) 30 tablet 6   macitentan (OPSUMIT) 10 MG tablet Take 1 tablet (10 mg total) by mouth daily. 30 tablet 11   meloxicam (MOBIC) 15 MG tablet Take 1 tablet (15 mg total) by mouth daily as needed. (Patient taking differently: Take 15 mg by mouth daily as needed for pain.) 30 tablet 1   Multiple Vitamin (MULTIVITAMIN WITH MINERALS) TABS tablet Take 1 tablet by mouth daily.     mycophenolate (CELLCEPT) 500 MG tablet Take 3 tablets (1,500 mg total) by mouth every morning AND 3 tablets (1,500 mg total) every evening. 540 tablet 1   Nintedanib (  OFEV) 150 MG CAPS Take 1 capsule (150 mg total) by mouth 2 (two) times daily. 60 capsule 5   NP THYROID 120 MG tablet Take 1 tablet (120 mg total) by mouth daily before breakfast. 90 tablet 0   ondansetron (ZOFRAN) 4 MG tablet Take 1 tablet (4 mg total) by mouth every 8 (eight) hours as needed for nausea or vomiting. 20 tablet 0   potassium chloride 20 MEQ/15ML (10%) SOLN Take 15 mLs (20 mEq total) by mouth daily with furosemide (Patient taking differently: Take 20 mEq by mouth daily as needed (For low potassium).) 450 mL 3   spironolactone (ALDACTONE) 25 MG tablet Take 1 tablet (25 mg total) by mouth 2 (two) times daily. 60 tablet 0   triamcinolone cream (KENALOG) 0.1 % Apply 1 Application topically daily as needed (For rash).     esomeprazole (NEXIUM) 40 MG capsule Take 1 capsule (40 mg total) by mouth 2 (two) times daily. 180 capsule 1   No facility-administered medications prior to visit.    Allergies  Allergen Reactions   Lisinopril Hives, Swelling and Other (See Comments)   Tocilizumab Other (See Comments)    Swelling and shortness of breath sharpe pains in back and chest tightness.    ROS Review of Systems   Constitutional:  Positive for fatigue. Negative for chills and fever.  HENT:  Negative for congestion, sinus pressure, sinus pain and sore throat.   Eyes:  Negative for pain and discharge.  Respiratory:  Positive for shortness of breath (intermittent). Negative for cough.   Cardiovascular:  Negative for chest pain and palpitations.  Gastrointestinal:  Positive for constipation. Negative for abdominal pain, nausea and vomiting.  Endocrine: Negative for polydipsia and polyuria.  Genitourinary:  Negative for dysuria and hematuria.  Musculoskeletal:  Positive for arthralgias. Negative for neck pain and neck stiffness.  Skin:  Negative for rash.  Neurological:  Negative for dizziness and weakness.  Psychiatric/Behavioral:  Negative for agitation and behavioral problems.       Objective:    Physical Exam Vitals reviewed.  Constitutional:      General: She is not in acute distress.    Appearance: She is not diaphoretic.  HENT:     Head: Normocephalic and atraumatic.     Nose: No congestion.     Mouth/Throat:     Mouth: Mucous membranes are moist.  Eyes:     General: No scleral icterus.    Extraocular Movements: Extraocular movements intact.  Cardiovascular:     Rate and Rhythm: Normal rate and regular rhythm.     Pulses: Normal pulses.     Heart sounds: Normal heart sounds. No murmur heard. Pulmonary:     Breath sounds: Normal breath sounds. No wheezing or rales.  Abdominal:     General: There is no distension.     Palpations: Abdomen is soft.     Tenderness: There is no abdominal tenderness.  Musculoskeletal:     Cervical back: Neck supple. No tenderness.     Right lower leg: No edema.     Left lower leg: No edema.  Skin:    General: Skin is warm.     Findings: No rash.  Neurological:     General: No focal deficit present.     Mental Status: She is alert and oriented to person, place, and time.     Sensory: No sensory deficit.     Motor: No weakness.  Psychiatric:         Mood and Affect:  Mood normal.        Behavior: Behavior normal.     BP 125/83 (BP Location: Right Arm, Patient Position: Sitting, Cuff Size: Normal)   Pulse 66   Ht 5\' 5"  (1.651 m)   Wt 135 lb 3.2 oz (61.3 kg)   SpO2 98%   BMI 22.50 kg/m  Wt Readings from Last 3 Encounters:  10/26/22 135 lb 3.2 oz (61.3 kg)  10/09/22 136 lb 3.9 oz (61.8 kg)  10/09/22 136 lb 3.2 oz (61.8 kg)    Lab Results  Component Value Date   TSH 0.898 06/06/2022   Lab Results  Component Value Date   WBC 5.6 10/12/2022   HGB 11.8 (L) 10/12/2022   HCT 37.7 10/12/2022   MCV 86.3 10/12/2022   PLT 193 10/12/2022   Lab Results  Component Value Date   NA 138 10/12/2022   K 3.6 10/12/2022   CO2 22 10/12/2022   GLUCOSE 90 10/12/2022   BUN 7 10/12/2022   CREATININE 0.61 10/12/2022   BILITOT 0.9 10/10/2022   ALKPHOS 76 10/10/2022   AST 17 10/10/2022   ALT 12 10/10/2022   PROT 6.9 10/10/2022   ALBUMIN 3.5 10/10/2022   CALCIUM 9.7 10/12/2022   ANIONGAP 11 10/12/2022   EGFR 106 04/13/2022   GFR 87.51 11/20/2019   Lab Results  Component Value Date   CHOL 196 04/13/2022   Lab Results  Component Value Date   HDL 54 04/13/2022   Lab Results  Component Value Date   LDLCALC 125 (H) 04/13/2022   Lab Results  Component Value Date   TRIG 93 04/13/2022   Lab Results  Component Value Date   CHOLHDL 3.6 04/13/2022   Lab Results  Component Value Date   HGBA1C 5.7 (H) 04/13/2022      Assessment & Plan:   Problem List Items Addressed This Visit       Digestive   GERD (gastroesophageal reflux disease)    On Nexium for GERD, cough could be due to GERD as well Avoid hot and spicy food She had better response to pantoprazole during hospitalization, switch to pantoprazole 40 mg BID for now      Relevant Medications   pantoprazole (PROTONIX) 40 MG tablet   SBO (small bowel obstruction) (HCC) - Primary    Had abdominal pain, nausea and vomiting X 2 days before admission Checked CT  abdomen pelvis with contrast - showed partial SBO, was advised to be admitted S/p NG tube decompression -slowly advance diet from liquids to semisolid, advised to slowly advance to solid food Advised to keep food diary Likely has an effect of scleroderma and adhesions from previous surgeries Has BM now, but loose BM due to mostly liquid diet Needs to maintain adequate hydration Can take MiraLAX as needed for constipation Zofran PRN for nausea      Relevant Orders   Basic Metabolic Panel (BMET)   Magnesium     Genitourinary   Acute cystitis without hematuria    Urine culture showed Klebsiella Treated with Rocephin        Other   Scleroderma (HCC)    Followed by Dr. Nickola Major Has multiple complications - ILD, pulmonary HTN, esophageal dysphagia and recently SBO      Hypokalemia    Takes potassium supplement with Lasix Recent hypokalemia likely due to vomiting She also takes spironolactone, which should improve her potassium Check BMP and Mg      Relevant Orders   Basic Metabolic Panel (BMET)   Magnesium  Meds ordered this encounter  Medications   DISCONTD: pantoprazole (PROTONIX) 40 MG tablet    Sig: Take 1 tablet (40 mg total) by mouth daily.    Dispense:  180 tablet    Refill:  1   pantoprazole (PROTONIX) 40 MG tablet    Sig: Take 1 tablet (40 mg total) by mouth 2 (two) times daily.    Dispense:  180 tablet    Refill:  1    Please discard the previous prescription with QD dosing.    Follow-up: Return if symptoms worsen or fail to improve.    Anabel Halon, MD

## 2022-10-27 LAB — BASIC METABOLIC PANEL
BUN/Creatinine Ratio: 20 (ref 9–23)
BUN: 11 mg/dL (ref 6–24)
CO2: 22 mmol/L (ref 20–29)
Calcium: 10.5 mg/dL — ABNORMAL HIGH (ref 8.7–10.2)
Chloride: 106 mmol/L (ref 96–106)
Creatinine, Ser: 0.56 mg/dL — ABNORMAL LOW (ref 0.57–1.00)
Glucose: 79 mg/dL (ref 70–99)
Potassium: 4.6 mmol/L (ref 3.5–5.2)
Sodium: 141 mmol/L (ref 134–144)
eGFR: 108 mL/min/{1.73_m2} (ref >59)

## 2022-10-27 LAB — MAGNESIUM: Magnesium: 1.9 mg/dL (ref 1.6–2.3)

## 2022-11-10 ENCOUNTER — Other Ambulatory Visit (HOSPITAL_COMMUNITY): Payer: Self-pay

## 2022-11-10 ENCOUNTER — Other Ambulatory Visit: Payer: Self-pay | Admitting: Internal Medicine

## 2022-11-10 DIAGNOSIS — K56609 Unspecified intestinal obstruction, unspecified as to partial versus complete obstruction: Secondary | ICD-10-CM | POA: Diagnosis not present

## 2022-11-10 DIAGNOSIS — I10 Essential (primary) hypertension: Secondary | ICD-10-CM

## 2022-11-10 MED ORDER — SPIRONOLACTONE 25 MG PO TABS
25.0000 mg | ORAL_TABLET | Freq: Two times a day (BID) | ORAL | 0 refills | Status: DC
Start: 2022-11-10 — End: 2022-12-21
  Filled 2022-11-10: qty 60, 30d supply, fill #0

## 2022-11-16 ENCOUNTER — Ambulatory Visit: Payer: Commercial Managed Care - PPO | Admitting: Pulmonary Disease

## 2022-11-16 ENCOUNTER — Encounter: Payer: Self-pay | Admitting: Pulmonary Disease

## 2022-11-16 VITALS — BP 108/62 | HR 85 | Temp 97.7°F | Ht 64.0 in | Wt 138.0 lb

## 2022-11-16 DIAGNOSIS — J849 Interstitial pulmonary disease, unspecified: Secondary | ICD-10-CM | POA: Diagnosis not present

## 2022-11-16 DIAGNOSIS — Z5181 Encounter for therapeutic drug level monitoring: Secondary | ICD-10-CM | POA: Diagnosis not present

## 2022-11-16 DIAGNOSIS — K219 Gastro-esophageal reflux disease without esophagitis: Secondary | ICD-10-CM | POA: Diagnosis not present

## 2022-11-16 DIAGNOSIS — I7300 Raynaud's syndrome without gangrene: Secondary | ICD-10-CM | POA: Diagnosis not present

## 2022-11-16 DIAGNOSIS — M349 Systemic sclerosis, unspecified: Secondary | ICD-10-CM | POA: Diagnosis not present

## 2022-11-16 DIAGNOSIS — M5412 Radiculopathy, cervical region: Secondary | ICD-10-CM | POA: Diagnosis not present

## 2022-11-16 DIAGNOSIS — Z6822 Body mass index (BMI) 22.0-22.9, adult: Secondary | ICD-10-CM | POA: Diagnosis not present

## 2022-11-16 DIAGNOSIS — I272 Pulmonary hypertension, unspecified: Secondary | ICD-10-CM | POA: Diagnosis not present

## 2022-11-16 DIAGNOSIS — M1991 Primary osteoarthritis, unspecified site: Secondary | ICD-10-CM | POA: Diagnosis not present

## 2022-11-16 NOTE — Patient Instructions (Signed)
Will get a high-resolution CT in 3 months for evaluation of the lung Return to clinic in 3 months after CT scan.

## 2022-11-16 NOTE — Progress Notes (Signed)
Karen [name]    [id number]    1970/09/23  Primary Care Physician:Patel, Earlie Lou, MD  Referring Physician: Anabel Halon, MD [address] [place],  [place] [address]  Problem list:  Scleroderma ILD with pulmonary HTN CellCept started May 2019, Ofev started December 2020  Pulmonary hypertension-started macitentan April 2021 Transplant candidate at [facility]  HPI: 52 y.o.  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 [name] on 09/27/17 with no evidence of pulmonary hypertension Started on CellCept May 2019 and uptitrated to max dose of 1.5 mg twice daily.  She had a hospitalization in [facility] in March 2019 for hypertension.  A CT chest scan at that time showed subtle lower lung findings of atelectasis, groundglass.  She has a right thyroid nodule which was biopsied on 08/15/17 with pathology showing benign findings.  Thyroid function tests are normal.  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 Finished pulmonary rehab in 2021 and is back at work full-time Repeat right heart cath 2021 by Dr. [name] showed mild pulmonary hypertension she has been started on  macitetan  Had annual follow-up at [facility] transplant program Oct 2023 when she las pulmonary function test that show a decline in DLCO from 39% to 35%. Reports worse exercise capacity and more fatigue She also had a repeat RHC in April 2023 which showed very mild PAH.  Reviewed note from Dr. Nickola Major, rheumatology 07/12/2017 Seen for joint pain, Raynaud's, elevated CK, skin thickening Serologies positive for Sjogren's negative,, normal CK and aldolase.  Pets: Dog, no cats, birds Occupation: Works as a Clinical biochemist in cardiology clinic Exposures: Has crawlspace but no dampness.  No mold. No hot tub, Jacuzzi.  She has down comforter.  Smoking history: Never smoker Travel history: Grew up in [place].  Lived in [place] and [place] Relevant family history: No family history of lung disease.    Interim history: Continues on CellCept 1.5 mg twice daily and Ofev On macitentan for pulmonary hypertension per Dr. [name]  At last visit she was started on Actemra by Dr. Nickola Major due to decline in pulmonary function.  She got 2 doses around March and April 2024 but then developed allergic reaction to it which was stopped.  Around May 2024 she had a visit with [facility] transplant center with PFTs that actually showed an improvement.  Outpatient Encounter Medications as of 11/16/2022  Medication Sig   acetaminophen (TYLENOL) 325 MG tablet Take 2 tablets (650 mg total) by mouth every 6 (six) hours as needed for mild pain, fever or headache (or Fever >/= 101).   amLODipine (NORVASC) 10 MG tablet Take 1 tablet (10 mg total) by mouth daily.   bisoprolol (ZEBETA) 5 MG tablet Take 1 tablet (5 mg total) by mouth daily.   clobetasol cream (TEMOVATE) 0.05 % Apply 1 Application topically daily as needed (For rash).   Fluocinolone Acetonide Scalp 0.01 % OIL Apply to affected frontal scalp 4 (four) times a week. Not to face   furosemide (LASIX) 40 MG tablet Take 1 tablet (40 mg total) by mouth 3 (three) times a week. Mon, Wed, Fri (Patient taking differently: Take 40 mg by mouth daily as needed for fluid.)   macitentan (OPSUMIT) 10 MG tablet Take 1 tablet (10 mg total) by mouth daily.   Multiple  Vitamin (MULTIVITAMIN WITH MINERALS) TABS tablet Take 1 tablet by mouth daily.   mycophenolate (CELLCEPT) 500 MG tablet Take 3 tablets (1,500 mg total) by mouth every morning AND 3 tablets (1,500 mg total) every evening.   Nintedanib (OFEV) 150 MG CAPS Take 1 capsule (150 mg total) by mouth 2 (two) times daily.   NP THYROID 120 MG tablet Take 1 tablet (120 mg total) by mouth daily before breakfast.   ondansetron  (ZOFRAN) 4 MG tablet Take 1 tablet (4 mg total) by mouth every 8 (eight) hours as needed for nausea or vomiting.   pantoprazole (PROTONIX) 40 MG tablet Take 1 tablet (40 mg total) by mouth 2 (two) times daily.   potassium chloride 20 MEQ/15ML (10%) SOLN Take 15 mLs (20 mEq total) by mouth daily with furosemide (Patient taking differently: Take 20 mEq by mouth daily as needed (For low potassium).)   spironolactone (ALDACTONE) 25 MG tablet Take 1 tablet (25 mg total) by mouth 2 (two) times daily.   triamcinolone cream (KENALOG) 0.1 % Apply 1 Application topically daily as needed (For rash).   [DISCONTINUED] meloxicam (MOBIC) 15 MG tablet Take 1 tablet (15 mg total) by mouth daily as needed. (Patient taking differently: Take 15 mg by mouth daily as needed for pain.)   [DISCONTINUED] benzonatate (TESSALON) 100 MG capsule Take 1 capsule (100 mg total) by mouth 2 (two) times daily as needed for cough.   [DISCONTINUED] PARoxetine (PAXIL) 10 MG tablet Take 1 tablet (10 mg total) by mouth daily as directed   No facility-administered encounter medications on file as of 11/16/2022.    Physical Exam: Blood pressure 108/62, pulse 85, temperature 97.7 F (36.5 C), temperature source Oral, height 5\' 4"  (1.626 m), weight 138 lb (62.6 kg), SpO2 100%. Gen:      No acute distress HEENT:  EOMI, sclera anicteric Neck:     No masses; no thyromegaly Lungs:    Clear to auscultation bilaterally; normal respiratory effort CV:         Regular rate and rhythm; no murmurs Abd:      + bowel sounds; soft, non-tender; no palpable masses, no distension Ext:    No edema; adequate peripheral perfusion Skin:      Warm and dry; no rash Neuro: alert and oriented x 3 Psych: normal mood and affect   Data Reviewed: Imaging CT chest, [facility] 07/24/2017 Dependent atelectasis, subtle groundglass opacities at the base.  2.5 cm right thyroid nodule.  I have reviewed the images personally  CT high-resolution 09/18/2017- patchy  reticular groundglass opacities, reticulation with subpleural sparing.  Mild traction bronchiectasis.  No honeycombing.  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  Aortic atherosclerosis, three-vessel coronary artery disease.  CT high-resolution 06/27/2018-stable interstitial lung disease.  CT high-resolution 06/11/2019-stable interstitial lung disease  CT high-resolution 05/19/2020-stable interstitial lung disease  CT high-resolution 11/10/2021-interstitial lung disease with slight progression compared to before I have reviewed the images personally.  PFTs FENO 09/06/2017-unable to complete  07/27/2017 FVC 1.65 (54%), FEV1 1.53 (62%], F/F 93, TLC 50, DLCO 44%, DLCO/VA 131%  10/25/2017 FVC 1.51 [50%], FEV1 1.26 [51%], F/F 83  01/29/2018 FVC 1.69 [5%), FEV1 1.50 [61%], F/F 89, DLCO 10.57 [41%]  07/01/2018 FVC 1.57 [52%), FEV1 1.47 [60%], F/F 93, DLCO 12.54 [59%)  01/06/2019 FVC 1.58 [52%], FEV1 1.46 [60%], F/F 92, TLC 2.62 [50%], DLCO 9.16 [42%] Severe restriction and diffusion impairment.  FVC is stable but DLCO has worsened.  6-minute walk  10/23/2017- 144 m Post walk heart rate, stats 94, 91%  02/04/2018- 249 m Post walk heart rate, sats 101, 99%  06/06/2018-212 m Post walk heart rate, sats 86, 92%  01/30/2019-139m Post walk heart rate, sats 83, 98%  01/13/21-238 m  Labs 08/20/2017-ANA, centromere, rheumatoid factor, SCL 70-negative QuantiFERON 09/12/2017-negative Hepatitis B, C screening 09/21/2017-negative G6PD 09/25/2017-13  Labs from Dr. Nickola Major 07/12/17 Rheumatoid factor < 10, CCP-8, ANCA-negative, double-stranded DNA-negative, Smith antibody-negative, SCL 70-negative, RNP-negative SSA 5.8, SSB 1.5 Smith antibody-negative, ANA IFA-negative Myositis panel-negative CK 165, C4-30, C3-168, total complement greater than 60, aldolase 8.8 Sed rate 9, CRP 0.9  CBC, hepatic panel 09/16/2020-within normal  limits  Cardiac Cardiac cath 09/05/2019 PA = 38/16 (25) PCW = 6 Fick cardiac output/index = 5.5/3.4 PVR = 3.4 WU Mild pulmonary hypertension  Cardiac cath 08/16/2021 PA = 36/14 (23) PCW = 3 Fick cardiac output/index = 7.3/4.4 PVR = 2.7  Overnight oximetry 01/30/2019- test time 8 hours 25 minutes Lowest O2 sat 87%.  Mean O2 sat 95%.  Time spent less than 88% - 1 minute 30 seconds No significant desaturation.  Does not need supplemental oxygen.  Assessment:  Scleroderma ILD Continue on CellCept 1.5 g twice daily, Bactrim prophylaxis and ofev She got 2 doses of Actemra in early 2024 due to progression of disease however developed hypersensitivity reaction.  She feels better and PFTs at [facility] in March 2024 were improved.  I am not sure if this is an improvement due to Actemra and if she will decline off therapy.  Since she is feeling well I will follow-up with the CT scan in 3 months and reassess.  If there is progression of disease then consider addition of Rituxan to CellCept.  Unable to do pulmonary rehab as it interferes with her work and she is exercising with walking at home. Follow-up at [facility] transplant and labs at [facility] show normal liver function and blood counts.  Mild pulmonary hypertension On macitentan.  Follows with Dr. [name] RHC earlier in 2023 with very mild pulm HTN  Esophageal dysfunction, esophageal stricture, GERD Continue on Protonix 20 mg twice daily Follows with [facility] gastroenterology  Plan/Recommendations: - Continue CellCept, Ofev -Observe off Actemra, get CT in 3 months. - Bactrim prophylaxis. - Exercise  Chilton Greathouse MD [facility] 11/16/2022, 2:42 PM

## 2022-11-17 DIAGNOSIS — M542 Cervicalgia: Secondary | ICD-10-CM | POA: Diagnosis not present

## 2022-11-23 DIAGNOSIS — M542 Cervicalgia: Secondary | ICD-10-CM | POA: Diagnosis not present

## 2022-11-23 DIAGNOSIS — L910 Hypertrophic scar: Secondary | ICD-10-CM | POA: Diagnosis not present

## 2022-11-23 DIAGNOSIS — D239 Other benign neoplasm of skin, unspecified: Secondary | ICD-10-CM | POA: Diagnosis not present

## 2022-11-30 DIAGNOSIS — M5412 Radiculopathy, cervical region: Secondary | ICD-10-CM | POA: Diagnosis not present

## 2022-12-01 LAB — HM PAP SMEAR
HM Pap smear: NORMAL
HPV, high-risk: NEGATIVE

## 2022-12-05 ENCOUNTER — Encounter (HOSPITAL_COMMUNITY): Payer: Self-pay

## 2022-12-05 ENCOUNTER — Other Ambulatory Visit: Payer: Self-pay

## 2022-12-05 ENCOUNTER — Telehealth: Payer: Self-pay | Admitting: Pharmacist

## 2022-12-05 NOTE — Telephone Encounter (Signed)
Received message from [facility] that patient has not filled Ofev since 08/29/22. Spoke with patient - she states she is taking the medication and is taking twice daily. She states she just received medication from pharmacy and then goes on to say she has a month of medication left.  MyChart message sent ot patient to confirm shipping address for medication.   Chesley Mires, PharmD, MPH, BCPS, CPP Clinical Pharmacist (Rheumatology and Pulmonology)

## 2022-12-06 ENCOUNTER — Other Ambulatory Visit: Payer: Self-pay

## 2022-12-06 ENCOUNTER — Other Ambulatory Visit (HOSPITAL_COMMUNITY): Payer: Self-pay

## 2022-12-06 ENCOUNTER — Telehealth: Payer: Self-pay | Admitting: Gastroenterology

## 2022-12-06 NOTE — Telephone Encounter (Signed)
Chart reviewed. She has had recurrent small bowel obstructions this year followed by general surgery in the hospital. If consult is for recurrent obstruction, this is usually followed by general surgery. We can see her if she would like as a new patient visit, but I would clarify with her where she wants to be seen for this issue.

## 2022-12-06 NOTE — Telephone Encounter (Signed)
Good Morning Dr Adela Lank  Supervising MD AM   We have received a call from patient requesting to transfer care from [facility]. Patient was last seen with practice 08/25/21.  Patient has a referral for small bowl obstruction. She is requesting transfer due to her not feeling satisfied with her previous practice.   Records are available for review in epic. Please review and advise on scheduling.   Thank you

## 2022-12-07 ENCOUNTER — Other Ambulatory Visit (HOSPITAL_COMMUNITY): Payer: Self-pay

## 2022-12-07 ENCOUNTER — Other Ambulatory Visit: Payer: Self-pay

## 2022-12-07 DIAGNOSIS — M542 Cervicalgia: Secondary | ICD-10-CM | POA: Diagnosis not present

## 2022-12-07 DIAGNOSIS — Z1231 Encounter for screening mammogram for malignant neoplasm of breast: Secondary | ICD-10-CM | POA: Diagnosis not present

## 2022-12-11 ENCOUNTER — Other Ambulatory Visit: Payer: Self-pay

## 2022-12-21 ENCOUNTER — Other Ambulatory Visit (HOSPITAL_COMMUNITY): Payer: Self-pay

## 2022-12-21 ENCOUNTER — Encounter: Payer: Self-pay | Admitting: Internal Medicine

## 2022-12-21 ENCOUNTER — Ambulatory Visit (INDEPENDENT_AMBULATORY_CARE_PROVIDER_SITE_OTHER): Payer: Commercial Managed Care - PPO | Admitting: Internal Medicine

## 2022-12-21 VITALS — BP 148/92 | HR 97 | Ht 64.0 in | Wt 139.4 lb

## 2022-12-21 DIAGNOSIS — L03116 Cellulitis of left lower limb: Secondary | ICD-10-CM | POA: Diagnosis not present

## 2022-12-21 DIAGNOSIS — I10 Essential (primary) hypertension: Secondary | ICD-10-CM | POA: Diagnosis not present

## 2022-12-21 DIAGNOSIS — I272 Pulmonary hypertension, unspecified: Secondary | ICD-10-CM

## 2022-12-21 DIAGNOSIS — R1319 Other dysphagia: Secondary | ICD-10-CM

## 2022-12-21 DIAGNOSIS — M5412 Radiculopathy, cervical region: Secondary | ICD-10-CM | POA: Diagnosis not present

## 2022-12-21 DIAGNOSIS — E039 Hypothyroidism, unspecified: Secondary | ICD-10-CM | POA: Diagnosis not present

## 2022-12-21 DIAGNOSIS — J849 Interstitial pulmonary disease, unspecified: Secondary | ICD-10-CM

## 2022-12-21 DIAGNOSIS — R7303 Prediabetes: Secondary | ICD-10-CM | POA: Diagnosis not present

## 2022-12-21 MED ORDER — SPIRONOLACTONE 25 MG PO TABS
25.0000 mg | ORAL_TABLET | Freq: Two times a day (BID) | ORAL | 1 refills | Status: DC
Start: 2022-12-21 — End: 2023-09-04
  Filled 2022-12-21: qty 180, 90d supply, fill #0
  Filled 2023-02-28 – 2023-03-05 (×2): qty 180, 90d supply, fill #1

## 2022-12-21 MED ORDER — DOXYCYCLINE HYCLATE 100 MG PO TABS
100.0000 mg | ORAL_TABLET | Freq: Two times a day (BID) | ORAL | 0 refills | Status: DC
Start: 2022-12-21 — End: 2023-01-11
  Filled 2022-12-21: qty 14, 7d supply, fill #0

## 2022-12-21 NOTE — Assessment & Plan Note (Addendum)
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

## 2022-12-21 NOTE — Progress Notes (Signed)
Established Patient Office Visit  Subjective:  Patient ID: Karen [name], female    DOB: [birth year]  Age: 52 y.o. MRN: [id number]  CC:  Chief Complaint  Patient presents with   Hypertension    Four month follow up     HPI Karen [name] is a 52 y.o. female with past medical history of HTN, scleroderma, ILD, pulmonary hypertension, dysphagia, hypothyroidism, chronic constipation and hot flashes who presents for f/u of her chronic medical conditions.  She has history of hypothyroidism, for which she takes NP thyroid. She currently denies any tremors, palpitations, or recent change in weight or appetite.   Essential hypertension and pulmonary hypertension: Her blood blood pressure is elevated today, her blood pressure was well controlled with amlodipine, Zebeta and spironolactone.  She  attributes it to her being stressed today due to multiple office visits.  She has history of pulmonary hypertension from ILD, for which she takes spironolactone and Lasix currently. She is also on Opsumit. She follows up with CHF clinic.  She currently denies any dyspnea or recent worsening of leg swelling.  She reports chronic cough, but denies any fever, chills, or recent worsening of dyspnea.  She has chronic nasal congestion and reports history of environmental allergies.  She has tried multiple antihistaminics including Zyrtec, Xyzal, etc.  She has history of scleroderma, for which she sees Dr. Nickola Major. She is on Cellcept now.  She sees Dr. 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  She is on Ofev for it.  She has neck pain, which is constant, worse with bending and is radiating towards LUE.  She also has numbness of the LUE.  She had  MRI of cervical spine, which showed herniated disc.  She is planned to get steroid injections and PT.  She has a skin lesion on left shin area.  She had an insect bite on 11/30/22, and had a blister over the shin area.  She had intense itching, which led to rupture of  blister.  She currently has tightening of the skin and mild tenderness around the site.  Denies any fever or chills.   Past Medical History:  Diagnosis Date   Acute respiratory failure with hypoxia due to flash pulmonary edema requiring intubation 02/13/2020   Calculus of gallbladder with acute cholecystitis without obstruction    CHF (congestive heart failure) (HCC)    GERD (gastroesophageal reflux disease)    Hypertension    Hypothyroidism    ILD (interstitial lung disease) (HCC) 02/08/2018   Interstitial lung disease (HCC)    Multinodular thyroid    benign FNA 08/2017.   Perimenopause 02/27/2019   Pulmonary edema    Scleroderma (HCC)    Status post laparoscopic cholecystectomy 02/13/20 02/13/2020   Vitamin D deficiency disease 02/27/2019    Past Surgical History:  Procedure Laterality Date   ABDOMINAL HERNIA REPAIR     BUNIONECTOMY Bilateral 10/2018   CHOLECYSTECTOMY N/A 02/13/2020   Procedure: LAPAROSCOPIC CHOLECYSTECTOMY;  Surgeon: Franky Macho, MD;  Location: [facility];  Service: General;  Laterality: N/A;   COLONOSCOPY WITH PROPOFOL N/A 01/02/2019   Procedure: COLONOSCOPY WITH PROPOFOL;  Surgeon: Corbin Ade, MD;  Location: [facility] SUITE;  Service: Endoscopy;  Laterality: N/A;  12:30pm   ESOPHAGOGASTRODUODENOSCOPY (EGD) WITH PROPOFOL N/A 01/28/2018   erosive reflux esophagitis, patulous EG Junction, no dilation, incomplete EGD due to retained food in stomach. GES thereafter with delayed gastric emptying.    RIGHT HEART CATH N/A 09/27/2017   Procedure: RIGHT HEART CATH;  Surgeon:  Bensimhon, Bevelyn Buckles, MD;  Location: [facility];  Service: Cardiovascular;  Laterality: N/A;   RIGHT HEART CATH N/A 09/05/2019   Procedure: RIGHT HEART CATH;  Surgeon: Dolores Patty, MD;  Location: [facility];  Service: Cardiovascular;  Laterality: N/A;   RIGHT/LEFT HEART CATH AND CORONARY ANGIOGRAPHY N/A 08/18/2021   Procedure: RIGHT/LEFT HEART CATH AND CORONARY ANGIOGRAPHY;  Surgeon:  Dolores Patty, MD;  Location: [facility];  Service: Cardiovascular;  Laterality: N/A;   UTERINE FIBROID SURGERY      Family History  Problem Relation Age of Onset   Hypertension Father    Hypertension Sister    Hypertension Brother    Diabetes Maternal Grandfather    Diabetes Maternal Uncle    Diabetes Mother    Colon cancer Neg Hx     Social History   Socioeconomic History   Marital status: Single    Spouse name: Not on file   Number of children: 0   Years of education: Not on file   Highest education level: Associate degree: occupational, Scientist, product/process development, or vocational program  Occupational History   Occupation: CMA    Employer: [employer]  Tobacco Use   Smoking status: Never   Smokeless tobacco: Never  Vaping Use   Vaping status: Former  Substance and Sexual Activity   Alcohol use: No   Drug use: No   Sexual activity: Yes  Other Topics Concern   Not on file  Social History Narrative   Patient is right-handed. She lives alone in one level home, a few steps to enter.CMA [employer].   Social Determinants of Health   Financial Resource Strain: Not on file  Food Insecurity: No Food Insecurity (10/10/2022)   Hunger Vital Sign    Worried About Running Out of Food in the Last Year: Never true    Ran Out of Food in the Last Year: Never true  Transportation Needs: No Transportation Needs (10/10/2022)   PRAPARE - Administrator, Civil Service (Medical): No    Lack of Transportation (Non-Medical): No  Physical Activity: Not on file  Stress: Not on file  Social Connections: Not on file  Intimate Partner Violence: Not At Risk (10/10/2022)   Humiliation, Afraid, Rape, and Kick questionnaire    Fear of Current or Ex-Partner: No    Emotionally Abused: No    Physically Abused: No    Sexually Abused: No    Outpatient Medications Prior to Visit  Medication Sig Dispense Refill   acetaminophen (TYLENOL) 325 MG tablet Take 2 tablets (650  mg total) by mouth every 6 (six) hours as needed for mild pain, fever or headache (or Fever >/= 101). 12 tablet 0   amLODipine (NORVASC) 10 MG tablet Take 1 tablet (10 mg total) by mouth daily. 90 tablet 3   bisoprolol (ZEBETA) 5 MG tablet Take 1 tablet (5 mg total) by mouth daily. 90 tablet 3   clobetasol cream (TEMOVATE) 0.05 % Apply 1 Application topically daily as needed (For rash).     Fluocinolone Acetonide Scalp 0.01 % OIL Apply to affected frontal scalp 4 (four) times a week. Not to face 118.28 mL 7   furosemide (LASIX) 40 MG tablet Take 1 tablet (40 mg total) by mouth 3 (three) times a week. Mon, Wed, Fri (Patient taking differently: Take 40 mg by mouth daily as needed for fluid.) 30 tablet 6   macitentan (OPSUMIT) 10 MG tablet Take 1 tablet (10 mg total) by  mouth daily. 30 tablet 11   Multiple Vitamin (MULTIVITAMIN WITH MINERALS) TABS tablet Take 1 tablet by mouth daily.     mycophenolate (CELLCEPT) 500 MG tablet Take 3 tablets (1,500 mg total) by mouth every morning AND 3 tablets (1,500 mg total) every evening. 540 tablet 1   Nintedanib (OFEV) 150 MG CAPS Take 1 capsule (150 mg total) by mouth 2 (two) times daily. 60 capsule 5   NP THYROID 120 MG tablet Take 1 tablet (120 mg total) by mouth daily before breakfast. 90 tablet 0   ondansetron (ZOFRAN) 4 MG tablet Take 1 tablet (4 mg total) by mouth every 8 (eight) hours as needed for nausea or vomiting. 20 tablet 0   pantoprazole (PROTONIX) 40 MG tablet Take 1 tablet (40 mg total) by mouth 2 (two) times daily. 180 tablet 1   potassium chloride 20 MEQ/15ML (10%) SOLN Take 15 mLs (20 mEq total) by mouth daily with furosemide (Patient taking differently: Take 20 mEq by mouth daily as needed (For low potassium).) 450 mL 3   triamcinolone cream (KENALOG) 0.1 % Apply 1 Application topically daily as needed (For rash).     spironolactone (ALDACTONE) 25 MG tablet Take 1 tablet (25 mg total) by mouth 2 (two) times daily. 60 tablet 0   No  facility-administered medications prior to visit.    Allergies  Allergen Reactions   Lisinopril Hives, Swelling and Other (See Comments)   Tocilizumab Other (See Comments)    Swelling and shortness of breath sharpe pains in back and chest tightness.    ROS Review of Systems  Constitutional:  Positive for fatigue. Negative for chills and fever.  HENT:  Negative for congestion, sinus pressure, sinus pain and sore throat.   Eyes:  Negative for pain and discharge.  Respiratory:  Positive for cough and shortness of breath (intermittent).   Cardiovascular:  Negative for chest pain and palpitations.  Gastrointestinal:  Positive for constipation. Negative for abdominal pain, nausea and vomiting.  Endocrine: Negative for polydipsia and polyuria.  Genitourinary:  Negative for dysuria and hematuria.  Musculoskeletal:  Positive for arthralgias and neck pain. Negative for neck stiffness.  Skin:  Negative for rash.  Neurological:  Positive for numbness (LUE). Negative for dizziness and weakness.  Psychiatric/Behavioral:  Negative for agitation and behavioral problems.       Objective:    Physical Exam Vitals reviewed.  Constitutional:      General: She is not in acute distress.    Appearance: She is not diaphoretic.  HENT:     Head: Normocephalic and atraumatic.     Nose: No congestion.     Mouth/Throat:     Mouth: Mucous membranes are moist.  Eyes:     General: No scleral icterus.    Extraocular Movements: Extraocular movements intact.  Cardiovascular:     Rate and Rhythm: Normal rate and regular rhythm.     Pulses: Normal pulses.     Heart sounds: Normal heart sounds. No murmur heard. Pulmonary:     Breath sounds: Normal breath sounds. No wheezing or rales.  Musculoskeletal:     Cervical back: Neck supple. Tenderness present.     Right lower leg: No edema.     Left lower leg: No edema.  Skin:    General: Skin is warm.     Findings: No rash.     Comments: Swelling and  tenderness over left shin area, about 3 cm in diameter  Neurological:     General: No focal deficit  present.     Mental Status: She is alert and oriented to person, place, and time.     Sensory: No sensory deficit.     Motor: No weakness.  Psychiatric:        Mood and Affect: Mood normal.        Behavior: Behavior normal.     BP (!) 148/92 (BP Location: Left Arm)   Pulse 97   Ht 5\' 4"  (1.626 m)   Wt 139 lb 6.4 oz (63.2 kg)   SpO2 97%   BMI 23.93 kg/m  Wt Readings from Last 3 Encounters:  12/21/22 139 lb 6.4 oz (63.2 kg)  11/16/22 138 lb (62.6 kg)  10/26/22 135 lb 3.2 oz (61.3 kg)    Lab Results  Component Value Date   TSH 0.898 06/06/2022   Lab Results  Component Value Date   WBC 5.6 10/12/2022   HGB 11.8 (L) 10/12/2022   HCT 37.7 10/12/2022   MCV 86.3 10/12/2022   PLT 193 10/12/2022   Lab Results  Component Value Date   NA 141 10/26/2022   K 4.6 10/26/2022   CO2 22 10/26/2022   GLUCOSE 79 10/26/2022   BUN 11 10/26/2022   CREATININE 0.56 (L) 10/26/2022   BILITOT 0.9 10/10/2022   ALKPHOS 76 10/10/2022   AST 17 10/10/2022   ALT 12 10/10/2022   PROT 6.9 10/10/2022   ALBUMIN 3.5 10/10/2022   CALCIUM 10.5 (H) 10/26/2022   ANIONGAP 11 10/12/2022   EGFR 108 10/26/2022   GFR 87.51 11/20/2019   Lab Results  Component Value Date   CHOL 196 04/13/2022   Lab Results  Component Value Date   HDL 54 04/13/2022   Lab Results  Component Value Date   LDLCALC 125 (H) 04/13/2022   Lab Results  Component Value Date   TRIG 93 04/13/2022   Lab Results  Component Value Date   CHOLHDL 3.6 04/13/2022   Lab Results  Component Value Date   HGBA1C 5.7 (H) 04/13/2022      Assessment & Plan:   Problem List Items Addressed This Visit       Cardiovascular and Mediastinum   HTN (hypertension) - Primary    BP Readings from Last 1 Encounters:  12/21/22 (!) 148/92   Elevated today, but has been well controlled lately Was well-controlled with amlodipine, Zebeta  and spironolactone -  refilled spironolactone Gets Zebeta from CHF clinic Counseled for compliance with the medications Advised DASH diet and moderate exercise/walking, at least 150 mins/week      Relevant Medications   spironolactone (ALDACTONE) 25 MG tablet   Other Relevant Orders   CMP14+EGFR   Pulmonary hypertension (HCC)    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 on empty stomach Check TSH and free T4      Relevant Orders   TSH + free T4     Nervous and Auditory   Cervical radiculopathy    Has chronic neck pain with radicular symptoms to LUE [facility] Planned to get steroid injection and PT        Other   Cellulitis of left lower extremity    Likely from rupture of blister, had insect bite Started empiric doxycycline Keep area clean and dry Okay to apply Neosporin for local skin irritation      Relevant Medications   doxycycline (VIBRA-TABS) 100 MG tablet   Other Visit Diagnoses     Prediabetes       Relevant Orders   Hemoglobin A1c       Meds ordered this encounter  Medications   spironolactone (ALDACTONE) 25 MG tablet    Sig: Take  1 tablet (25 mg total) by mouth 2 (two) times daily.    Dispense:  180 tablet    Refill:  1   doxycycline (VIBRA-TABS) 100 MG tablet    Sig: Take 1 tablet (100 mg total) by mouth 2 (two) times daily.    Dispense:  14 tablet    Refill:  0    Follow-up: Return in about 6 weeks (around 02/01/2023) for Annual physical.    Anabel Halon, MD

## 2022-12-21 NOTE — Assessment & Plan Note (Signed)
Likely from rupture of blister, had insect bite Started empiric doxycycline Keep area clean and dry Okay to apply Neosporin for local skin irritation

## 2022-12-21 NOTE — Assessment & Plan Note (Signed)
Has chronic neck pain with radicular symptoms to LUE Followed by [facility] Planned to get steroid injection and PT

## 2022-12-21 NOTE — Assessment & Plan Note (Signed)
Followed by Dr. Isaiah Serge On Durel Salts Has had flash pulmonary edema and pulmonary hypertension from it, followed by CHF clinic - but currently appears euvolemic  Chronic cough, likely due to ILD Tessalon as needed for cough

## 2022-12-21 NOTE — Assessment & Plan Note (Signed)
BP Readings from Last 1 Encounters:  12/21/22 (!) 148/92   Elevated today, but has been well controlled lately Was well-controlled with amlodipine, Zebeta and spironolactone -  refilled spironolactone Gets Zebeta from CHF clinic Counseled for compliance with the medications Advised DASH diet and moderate exercise/walking, at least 150 mins/week

## 2022-12-21 NOTE — Assessment & Plan Note (Signed)
Lab Results  Component Value Date   TSH 0.898 06/06/2022   On NP thyroid 120 mg QD -needs to take it in a.m. on empty stomach Check TSH and free T4

## 2022-12-21 NOTE — Patient Instructions (Signed)
Please continue to take medications as prescribed.  Please continue to follow low salt diet and perform moderate exercise/walking at least 150 mins/week.  Please get blood tests done within a week.

## 2022-12-21 NOTE — Assessment & Plan Note (Signed)
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

## 2022-12-27 ENCOUNTER — Other Ambulatory Visit (HOSPITAL_COMMUNITY): Payer: Self-pay

## 2022-12-27 NOTE — Telephone Encounter (Signed)
Called patient to further advise on recommendations. Left detailed message will try back again at a later time.

## 2022-12-28 DIAGNOSIS — I10 Essential (primary) hypertension: Secondary | ICD-10-CM | POA: Diagnosis not present

## 2022-12-28 DIAGNOSIS — R7303 Prediabetes: Secondary | ICD-10-CM | POA: Diagnosis not present

## 2022-12-28 DIAGNOSIS — E039 Hypothyroidism, unspecified: Secondary | ICD-10-CM | POA: Diagnosis not present

## 2022-12-28 DIAGNOSIS — I272 Pulmonary hypertension, unspecified: Secondary | ICD-10-CM | POA: Diagnosis not present

## 2022-12-29 ENCOUNTER — Other Ambulatory Visit (HOSPITAL_COMMUNITY): Payer: Self-pay

## 2022-12-29 LAB — CMP14+EGFR: Sodium: 142 mmol/L (ref 134–144)

## 2022-12-30 LAB — CMP14+EGFR
ALT: 8 IU/L (ref 0–32)
AST: 16 IU/L (ref 0–40)
Albumin: 4.2 g/dL (ref 3.8–4.9)
Alkaline Phosphatase: 95 IU/L (ref 44–121)
BUN/Creatinine Ratio: 15 (ref 9–23)
BUN: 9 mg/dL (ref 6–24)
Bilirubin Total: 0.5 mg/dL (ref 0.0–1.2)
CO2: 23 mmol/L (ref 20–29)
Calcium: 10 mg/dL (ref 8.7–10.2)
Chloride: 104 mmol/L (ref 96–106)
Creatinine, Ser: 0.6 mg/dL (ref 0.57–1.00)
Globulin, Total: 2.5 g/dL (ref 1.5–4.5)
Glucose: 77 mg/dL (ref 70–99)
Potassium: 3.7 mmol/L (ref 3.5–5.2)
Total Protein: 6.7 g/dL (ref 6.0–8.5)
eGFR: 107 mL/min/{1.73_m2} (ref >59)

## 2022-12-30 LAB — TSH+FREE T4
Free T4: 1.41 ng/dL (ref 0.82–1.77)
TSH: 1.61 u[IU]/mL (ref 0.450–4.500)

## 2022-12-30 LAB — HEMOGLOBIN A1C
Est. average glucose Bld gHb Est-mCnc: 123 mg/dL
Hgb A1c MFr Bld: 5.9 % — ABNORMAL HIGH (ref 4.8–5.6)

## 2023-01-01 ENCOUNTER — Other Ambulatory Visit (HOSPITAL_COMMUNITY): Payer: Self-pay

## 2023-01-01 ENCOUNTER — Encounter (HOSPITAL_COMMUNITY): Payer: Self-pay

## 2023-01-01 NOTE — Telephone Encounter (Signed)
Patient was advised, would like to continue her GI care with you. You currently do not have anything available for an OV. Please advise on scheduling.   Thanks

## 2023-01-01 NOTE — Telephone Encounter (Signed)
Okay that's fine, I am happy to see her but given she is not established with [place] yet she will have to wait until next available new office patient until she can be seen. I would book her for next available new patient visit and then place on wait list to be notified if a cancellation arises and we can get her in sooner. Thanks

## 2023-01-04 DIAGNOSIS — M5412 Radiculopathy, cervical region: Secondary | ICD-10-CM | POA: Diagnosis not present

## 2023-01-09 ENCOUNTER — Encounter: Payer: Self-pay | Admitting: Gastroenterology

## 2023-01-10 ENCOUNTER — Emergency Department (HOSPITAL_COMMUNITY)
Admission: EM | Admit: 2023-01-10 | Discharge: 2023-01-11 | Disposition: A | Discharge status: Home / Self Care | Payer: Commercial Managed Care - PPO | Attending: Emergency Medicine | Admitting: Emergency Medicine

## 2023-01-10 ENCOUNTER — Other Ambulatory Visit: Payer: Self-pay

## 2023-01-10 DIAGNOSIS — W57XXXA Bitten or stung by nonvenomous insect and other nonvenomous arthropods, initial encounter: Secondary | ICD-10-CM | POA: Insufficient documentation

## 2023-01-10 DIAGNOSIS — L03113 Cellulitis of right upper limb: Secondary | ICD-10-CM | POA: Insufficient documentation

## 2023-01-10 DIAGNOSIS — S40861A Insect bite (nonvenomous) of right upper arm, initial encounter: Secondary | ICD-10-CM | POA: Diagnosis not present

## 2023-01-10 NOTE — ED Triage Notes (Addendum)
Pt c/o rash and swelling to right arm and shoulder. States the place on her right arm has been oozing.

## 2023-01-11 ENCOUNTER — Other Ambulatory Visit (HOSPITAL_COMMUNITY): Payer: Self-pay

## 2023-01-11 DIAGNOSIS — M5412 Radiculopathy, cervical region: Secondary | ICD-10-CM | POA: Diagnosis not present

## 2023-01-11 MED ORDER — DOXYCYCLINE HYCLATE 100 MG PO TABS
100.0000 mg | ORAL_TABLET | Freq: Once | ORAL | Status: AC
  Administered 2023-01-11: 100 mg via ORAL
  Filled 2023-01-11: qty 1

## 2023-01-11 MED ORDER — DOXYCYCLINE HYCLATE 100 MG PO TABS
100.0000 mg | ORAL_TABLET | Freq: Two times a day (BID) | ORAL | 0 refills | Status: DC
  Filled 2023-01-11 (×2): qty 20, 10d supply, fill #0

## 2023-01-11 MED ORDER — DOXYCYCLINE HYCLATE 100 MG PO TABS: 100.0000 mg | ORAL_TABLET | Freq: Once | ORAL | Status: DC

## 2023-01-11 MED ORDER — DEXAMETHASONE SODIUM PHOSPHATE 10 MG/ML IJ SOLN
10.0000 mg | Freq: Once | INTRAMUSCULAR | Status: AC
  Administered 2023-01-11: 10 mg via INTRAMUSCULAR
  Filled 2023-01-11: qty 1

## 2023-01-11 MED ORDER — HYDROXYZINE HCL 25 MG PO TABS
50.0000 mg | ORAL_TABLET | Freq: Once | ORAL | Status: AC
  Administered 2023-01-11: 50 mg via ORAL
  Filled 2023-01-11: qty 2

## 2023-01-11 MED ORDER — HYDROXYZINE HCL 25 MG PO TABS
25.0000 mg | ORAL_TABLET | Freq: Four times a day (QID) | ORAL | 0 refills | Status: DC | PRN
  Filled 2023-01-11: qty 30, 4d supply, fill #0

## 2023-01-11 MED ORDER — HYDROXYZINE HCL 25 MG PO TABS: 25.0000 mg | ORAL_TABLET | Freq: Four times a day (QID) | ORAL | 0 refills | Status: DC | PRN

## 2023-01-11 NOTE — ED Notes (Signed)
Discharge instructions provided by edp were discussed with pt. Pt verbalized understanding with no additional questions at this time. Pt going home with family at bedside.

## 2023-01-11 NOTE — ED Provider Notes (Signed)
[place] EMERGENCY DEPARTMENT AT [facility] Provider Note   CSN: [id number] Arrival date & time: 01/10/23  1932     History  Chief Complaint  Patient presents with   Rash    Karen [name] is a 52 y.o. female.  Patient presents to the emergency department for evaluation of swelling of her right upper arm.  Patient reports that she was bitten by something in the area is very itchy.       Home Medications Prior to Admission medications   Medication Sig Start Date End Date Taking? Authorizing Provider  doxycycline (VIBRAMYCIN) 100 MG capsule Take 1 capsule (100 mg total) by mouth 2 (two) times daily. 01/11/23  Yes Skylyn Slezak, Canary Brim, MD  hydrOXYzine (ATARAX) 25 MG tablet Take 1-2 tablets (25-50 mg total) by mouth every 6 (six) hours as needed for itching. 01/11/23  Yes Chrsitopher Wik, Canary Brim, MD  acetaminophen (TYLENOL) 325 MG tablet Take 2 tablets (650 mg total) by mouth every 6 (six) hours as needed for mild pain, fever or headache (or Fever >/= 101). 02/15/20   Shon Hale, MD  amLODipine (NORVASC) 10 MG tablet Take 1 tablet (10 mg total) by mouth daily. 08/17/22   Anabel Halon, MD  bisoprolol (ZEBETA) 5 MG tablet Take 1 tablet (5 mg total) by mouth daily. 08/24/22   Bensimhon, Bevelyn Buckles, MD  clobetasol cream (TEMOVATE) 0.05 % Apply 1 Application topically daily as needed (For rash).    [provider]  Fluocinolone Acetonide Scalp 0.01 % OIL Apply to affected frontal scalp 4 (four) times a week. Not to face 07/27/22     furosemide (LASIX) 40 MG tablet Take 1 tablet (40 mg total) by mouth 3 (three) times a week. Mon, Wed, Fri Patient taking differently: Take 40 mg by mouth daily as needed for fluid. 08/25/22   Bensimhon, Bevelyn Buckles, MD  macitentan (OPSUMIT) 10 MG tablet Take 1 tablet (10 mg total) by mouth daily. 03/24/22   Bensimhon, Bevelyn Buckles, MD  Multiple Vitamin (MULTIVITAMIN WITH MINERALS) TABS tablet Take 1 tablet by mouth daily.    [provider]  mycophenolate (CELLCEPT) 500 MG tablet Take 3 tablets (1,500 mg total) by mouth every morning AND 3 tablets (1,500 mg total) every evening. 01/18/22   Mannam, Colbert Coyer, MD  Nintedanib (OFEV) 150 MG CAPS Take 1 capsule (150 mg total) by mouth 2 (two) times daily. 08/28/22   Quentin Angst, MD  NP THYROID 120 MG tablet Take 1 tablet (120 mg total) by mouth daily before breakfast. 08/18/22   Anabel Halon, MD  ondansetron (ZOFRAN) 4 MG tablet Take 1 tablet (4 mg total) by mouth every 8 (eight) hours as needed for nausea or vomiting. 10/09/22   Anabel Halon, MD  pantoprazole (PROTONIX) 40 MG tablet Take 1 tablet (40 mg total) by mouth 2 (two) times daily. 10/26/22   Anabel Halon, MD  potassium chloride 20 MEQ/15ML (10%) SOLN Take 15 mLs (20 mEq total) by mouth daily with furosemide Patient taking differently: Take 20 mEq by mouth daily as needed (For low potassium). 08/17/22   Anabel Halon, MD  spironolactone (ALDACTONE) 25 MG tablet Take 1 tablet (25 mg total) by mouth 2 (two) times daily. 12/21/22   Anabel Halon, MD  triamcinolone cream (KENALOG) 0.1 % Apply 1 Application topically daily as needed (For rash).    [provider]  PARoxetine (PAXIL) 10 MG tablet Take 1 tablet (10 mg total) by mouth daily as  directed 10/07/20 12/02/20        Allergies    Lisinopril and Tocilizumab    Review of Systems   Review of Systems  Physical Exam Updated Vital Signs BP (!) 150/84 (BP Location: Right Arm)   Pulse 85   Temp 98.7 F (37.1 C) (Oral)   Resp 18   SpO2 98%  Physical Exam Vitals and nursing note reviewed.  Constitutional:      Appearance: Normal appearance.  HENT:     Head: Normocephalic and atraumatic.  Cardiovascular:     Rate and Rhythm: Normal rate.  Pulmonary:     Effort: Pulmonary effort is normal.     Breath sounds: Normal breath sounds.  Musculoskeletal:        General: Swelling (Right tricep area) present.  Skin:    Comments: 2 small scabs  on the posterior aspect of the right upper arm with associated swelling, some erythema  Neurological:     Mental Status: She is alert.     ED Results / Procedures / Treatments   Labs (all labs ordered are listed, but only abnormal results are displayed) Labs Reviewed - No data to display  EKG None  Radiology No results found.  Procedures Procedures    Medications Ordered in ED Medications  dexamethasone (DECADRON) injection 10 mg (has no administration in time range)  hydrOXYzine (ATARAX) tablet 50 mg (has no administration in time range)  doxycycline (VIBRA-TABS) tablet 100 mg (has no administration in time range)    ED Course/ Medical Decision Making/ A&P                                 Medical Decision Making  Presents with itching, swelling and redness to the right upper arm.  There is evidence of some type of bite or sting in 2 spots in the center of the irritated area.  Could be local reaction but will treat for infection as well.  No fluctuance or signs of drainable abscess.        Final Clinical Impression(s) / ED Diagnoses Final diagnoses:  Insect bite of right upper arm, initial encounter  Cellulitis of right upper extremity    Rx / DC Orders ED Discharge Orders          Ordered    hydrOXYzine (ATARAX) 25 MG tablet  Every 6 hours PRN        01/11/23 0047    doxycycline (VIBRAMYCIN) 100 MG capsule  2 times daily        01/11/23 0047              Gilda Crease, MD 01/11/23 [phone]

## 2023-01-24 ENCOUNTER — Encounter: Payer: Self-pay | Admitting: Pharmacist

## 2023-01-25 DIAGNOSIS — M5412 Radiculopathy, cervical region: Secondary | ICD-10-CM | POA: Diagnosis not present

## 2023-02-01 ENCOUNTER — Ambulatory Visit (INDEPENDENT_AMBULATORY_CARE_PROVIDER_SITE_OTHER): Payer: Commercial Managed Care - PPO | Admitting: Internal Medicine

## 2023-02-01 ENCOUNTER — Other Ambulatory Visit (HOSPITAL_COMMUNITY): Payer: Self-pay

## 2023-02-01 ENCOUNTER — Encounter: Payer: Self-pay | Admitting: Internal Medicine

## 2023-02-01 VITALS — BP 120/84 | HR 66 | Ht 64.0 in | Wt 139.2 lb

## 2023-02-01 DIAGNOSIS — N951 Menopausal and female climacteric states: Secondary | ICD-10-CM

## 2023-02-01 DIAGNOSIS — Z0001 Encounter for general adult medical examination with abnormal findings: Secondary | ICD-10-CM | POA: Diagnosis not present

## 2023-02-01 DIAGNOSIS — I272 Pulmonary hypertension, unspecified: Secondary | ICD-10-CM | POA: Diagnosis not present

## 2023-02-01 DIAGNOSIS — M5412 Radiculopathy, cervical region: Secondary | ICD-10-CM | POA: Diagnosis not present

## 2023-02-01 DIAGNOSIS — I10 Essential (primary) hypertension: Secondary | ICD-10-CM

## 2023-02-01 DIAGNOSIS — E039 Hypothyroidism, unspecified: Secondary | ICD-10-CM

## 2023-02-01 MED ORDER — VEOZAH 45 MG PO TABS
1.0000 | ORAL_TABLET | Freq: Every day | ORAL | 3 refills | Status: AC
Start: 2023-02-01 — End: ?
  Filled 2023-02-01: qty 30, 30d supply, fill #0

## 2023-02-01 NOTE — Progress Notes (Signed)
Established Patient Office Visit  Subjective:  Patient ID: Karen [name], female    DOB: 18-May-1970  Age: 53 y.o. MRN: [id number]  CC:  Chief Complaint  Patient presents with   Annual Exam    HPI Karen [name] is a 52 y.o. female with past medical history of HTN, scleroderma, ILD, pulmonary hypertension, dysphagia, hypothyroidism, chronic constipation and hot flashes who presents for annual physical.  She has history of hypothyroidism, for which she takes NP thyroid. She currently denies any tremors, palpitations, or recent change in weight or appetite.    Essential hypertension and pulmonary hypertension: Her blood blood pressure is wnl today. Her blood pressure has been well controlled with amlodipine 10 mg QD, Zebeta 5 mg QD and spironolactone 25 mg BID. She has history of pulmonary hypertension from ILD, for which she takes spironolactone and Lasix currently. She is also on Opsumit. She follows up with CHF clinic.  She currently denies any dyspnea, but has had recent worsening of leg swelling.  She reports chronic cough, but denies any fever, chills, or recent worsening of dyspnea.  She has chronic nasal congestion and reports history of environmental allergies.  She has tried multiple antihistaminics including Zyrtec, Xyzal, etc.   She has history of scleroderma, for which she sees Dr. Nickola Major. She is on Cellcept now.  She sees Dr. 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  She is on Ofev for it.  She had steroid injection for cervical neck pain.  She has noticed improvement in neck pain and radiating pain in the LUE.  She still has numbness of the left thumb.  She reports chronic hot flashes.  She has tried Paxil without much relief. Past Medical History:  Diagnosis Date   Acute respiratory failure with hypoxia due to flash pulmonary edema requiring intubation 02/13/2020   Calculus of gallbladder with acute cholecystitis without obstruction    CHF (congestive heart failure)  (HCC)    GERD (gastroesophageal reflux disease)    Hypertension    Hypothyroidism    ILD (interstitial lung disease) (HCC) 02/08/2018   Interstitial lung disease (HCC)    Multinodular thyroid    benign FNA 08/2017.   Perimenopause 02/27/2019   Pulmonary edema    Scleroderma (HCC)    Status post laparoscopic cholecystectomy 02/13/20 02/13/2020   Vitamin D deficiency disease 02/27/2019    Past Surgical History:  Procedure Laterality Date   ABDOMINAL HERNIA REPAIR     BUNIONECTOMY Bilateral 10/2018   CHOLECYSTECTOMY N/A 02/13/2020   Procedure: LAPAROSCOPIC CHOLECYSTECTOMY;  Surgeon: Franky Macho, MD;  Location: [facility];  Service: General;  Laterality: N/A;   COLONOSCOPY WITH PROPOFOL N/A 01/02/2019   Procedure: COLONOSCOPY WITH PROPOFOL;  Surgeon: Corbin Ade, MD;  Location: [facility] SU[facility];  Service: Endoscopy;  Laterality: N/A;  12:30pm   ESOPHAGOGASTRODUODENOSCOPY (EGD) WITH PROPOFOL N/A 01/28/2018   erosive reflux esophagitis, patulous EG Junction, no dilation, incomplete EGD due to retained food in stomach. GES thereafter with delayed gastric emptying.    RIGHT HEART CATH N/A 09/27/2017   Procedure: RIGHT HEART CATH;  Surgeon: Dolores Patty, MD;  Location: [facility] INVASIVE CV LAB;  Service: Cardiovascular;  Laterality: N/A;   RIGHT HEART CATH N/A 09/05/2019   Procedure: RIGHT HEART CATH;  Surgeon: Dolores Patty, MD;  Location: [facility] INVASIVE CV LAB;  Service: Cardiovascular;  Laterality: N/A;   RIGHT/LEFT HEART CATH AND CORONARY ANGIOGRAPHY N/A 08/18/2021   Procedure: RIGHT/LEFT HEART CATH AND CORONARY ANGIOGRAPHY;  Surgeon: Dolores Patty, MD;  Location: [facility] INVASIVE CV LAB;  Service: Cardiovascular;  Laterality: N/A;   UTERINE FIBROID SURGERY      Family History  Problem Relation Age of Onset   Hypertension Father    Hypertension Sister    Hypertension Brother    Diabetes Maternal Grandfather    Diabetes Maternal Uncle    Diabetes Mother    Colon cancer Neg Hx      Social History   Socioeconomic History   Marital status: Single    Spouse name: Not on file   Number of children: 0   Years of education: Not on file   Highest education level: Associate degree: occupational, Scientist, product/process development, or vocational program  Occupational History   Occupation: CMA    Employer: [employer]  Tobacco Use   Smoking status: Never   Smokeless tobacco: Never  Vaping Use   Vaping status: Former  Substance and Sexual Activity   Alcohol use: No   Drug use: No   Sexual activity: Yes  Other Topics Concern   Not on file  Social History Narrative   Patient is right-handed. She lives alone in one level home, a few steps to enter.CMA [employer].   Social Determinants of Health   Financial Resource Strain: Not on file  Food Insecurity: No Food Insecurity (10/10/2022)   Hunger Vital Sign    Worried About Running Out of Food in the Last Year: Never true    Ran Out of Food in the Last Year: Never true  Transportation Needs: No Transportation Needs (10/10/2022)   PRAPARE - Administrator, Civil Service (Medical): No    Lack of Transportation (Non-Medical): No  Physical Activity: Not on file  Stress: Not on file  Social Connections: Not on file  Intimate Partner Violence: Not At Risk (10/10/2022)   Humiliation, Afraid, Rape, and Kick questionnaire    Fear of Current or Ex-Partner: No    Emotionally Abused: No    Physically Abused: No    Sexually Abused: No    Outpatient Medications Prior to Visit  Medication Sig Dispense Refill   acetaminophen (TYLENOL) 325 MG tablet Take 2 tablets (650 mg total) by mouth every 6 (six) hours as needed for mild pain, fever or headache (or Fever >/= 101). 12 tablet 0   amLODipine (NORVASC) 10 MG tablet Take 1 tablet (10 mg total) by mouth daily. 90 tablet 3   bisoprolol (ZEBETA) 5 MG tablet Take 1 tablet (5 mg total) by mouth daily. 90 tablet 3   clobetasol cream (TEMOVATE) 0.05 % Apply 1 Application  topically daily as needed (For rash).     Fluocinolone Acetonide Scalp 0.01 % OIL Apply to affected frontal scalp 4 (four) times a week. Not to face 118.28 mL 7   furosemide (LASIX) 40 MG tablet Take 1 tablet (40 mg total) by mouth 3 (three) times a week. Mon, Wed, Fri (Patient taking differently: Take 40 mg by mouth daily as needed for fluid.) 30 tablet 6   hydrOXYzine (ATARAX) 25 MG tablet Take 1-2 tablets (25-50 mg total) by mouth every 6 (six) hours as needed for itching. 30 tablet 0   macitentan (OPSUMIT) 10 MG tablet Take 1 tablet (10 mg total) by mouth daily. 30 tablet 11   Multiple Vitamin (MULTIVITAMIN WITH MINERALS) TABS tablet Take 1 tablet by mouth daily.     mycophenolate (CELLCEPT) 500 MG tablet Take 3 tablets (1,500 mg total) by mouth every morning AND 3 tablets (1,500 mg total) every evening.  540 tablet 1   Nintedanib (OFEV) 150 MG CAPS Take 1 capsule (150 mg total) by mouth 2 (two) times daily. 60 capsule 5   NP THYROID 120 MG tablet Take 1 tablet (120 mg total) by mouth daily before breakfast. 90 tablet 0   ondansetron (ZOFRAN) 4 MG tablet Take 1 tablet (4 mg total) by mouth every 8 (eight) hours as needed for nausea or vomiting. 20 tablet 0   pantoprazole (PROTONIX) 40 MG tablet Take 1 tablet (40 mg total) by mouth 2 (two) times daily. 180 tablet 1   potassium chloride 20 MEQ/15ML (10%) SOLN Take 15 mLs (20 mEq total) by mouth daily with furosemide (Patient taking differently: Take 20 mEq by mouth daily as needed (For low potassium).) 450 mL 3   spironolactone (ALDACTONE) 25 MG tablet Take 1 tablet (25 mg total) by mouth 2 (two) times daily. 180 tablet 1   triamcinolone cream (KENALOG) 0.1 % Apply 1 Application topically daily as needed (For rash).     doxycycline (VIBRA-TABS) 100 MG tablet Take 1 tablet (100 mg total) by mouth 2 (two) times daily. 20 tablet 0   No facility-administered medications prior to visit.    Allergies  Allergen Reactions   Lisinopril Hives, Swelling  and Other (See Comments)   Tocilizumab Other (See Comments)    Swelling and shortness of breath sharpe pains in back and chest tightness.    ROS Review of Systems  Constitutional:  Positive for fatigue. Negative for chills and fever.  HENT:  Negative for congestion, sinus pressure, sinus pain and sore throat.   Eyes:  Negative for pain and discharge.  Respiratory:  Positive for cough and shortness of breath (intermittent).   Cardiovascular:  Negative for chest pain and palpitations.  Gastrointestinal:  Positive for constipation. Negative for abdominal pain, nausea and vomiting.  Endocrine: Negative for polydipsia and polyuria.  Genitourinary:  Negative for dysuria and hematuria.  Musculoskeletal:  Positive for arthralgias and neck pain. Negative for neck stiffness.  Skin:  Negative for rash.  Neurological:  Positive for numbness (LUE). Negative for dizziness and weakness.  Psychiatric/Behavioral:  Negative for agitation and behavioral problems.       Objective:    Physical Exam Vitals reviewed.  Constitutional:      General: She is not in acute distress.    Appearance: She is not diaphoretic.  HENT:     Head: Normocephalic and atraumatic.     Nose: No congestion.     Mouth/Throat:     Mouth: Mucous membranes are moist.  Eyes:     General: No scleral icterus.    Extraocular Movements: Extraocular movements intact.  Cardiovascular:     Rate and Rhythm: Normal rate and regular rhythm.     Pulses: Normal pulses.     Heart sounds: Normal heart sounds. No murmur heard. Pulmonary:     Breath sounds: Normal breath sounds. No wheezing or rales.  Abdominal:     Palpations: Abdomen is soft.     Tenderness: There is no abdominal tenderness.  Musculoskeletal:     Cervical back: Neck supple. Tenderness present.     Right lower leg: Edema (Mild) present.     Left lower leg: Edema (Mild) present.  Skin:    General: Skin is warm.     Findings: No rash.  Neurological:     General:  No focal deficit present.     Mental Status: She is alert and oriented to person, place, and time.  Cranial Nerves: No cranial nerve deficit.     Sensory: No sensory deficit.     Motor: No weakness.  Psychiatric:        Mood and Affect: Mood normal.        Behavior: Behavior normal.     BP 120/84 (BP Location: Right Arm, Patient Position: Sitting, Cuff Size: Normal)   Pulse 66   Ht 5\' 4"  (1.626 m)   Wt 139 lb 3.2 oz (63.1 kg)   SpO2 98%   BMI 23.89 kg/m  Wt Readings from Last 3 Encounters:  02/01/23 139 lb 3.2 oz (63.1 kg)  12/21/22 139 lb 6.4 oz (63.2 kg)  11/16/22 138 lb (62.6 kg)    Lab Results  Component Value Date   TSH 1.610 12/28/2022   Lab Results  Component Value Date   WBC 5.6 10/12/2022   HGB 11.8 (L) 10/12/2022   HCT 37.7 10/12/2022   MCV 86.3 10/12/2022   PLT 193 10/12/2022   Lab Results  Component Value Date   NA 142 12/28/2022   K 3.7 12/28/2022   CO2 23 12/28/2022   GLUCOSE 77 12/28/2022   BUN 9 12/28/2022   CREATININE 0.60 12/28/2022   BILITOT 0.5 12/28/2022   ALKPHOS 95 12/28/2022   AST 16 12/28/2022   ALT 8 12/28/2022   PROT 6.7 12/28/2022   ALBUMIN 4.2 12/28/2022   CALCIUM 10.0 12/28/2022   ANIONGAP 11 10/12/2022   EGFR 107 12/28/2022   GFR 87.51 11/20/2019   Lab Results  Component Value Date   CHOL 196 04/13/2022   Lab Results  Component Value Date   HDL 54 04/13/2022   Lab Results  Component Value Date   LDLCALC 125 (H) 04/13/2022   Lab Results  Component Value Date   TRIG 93 04/13/2022   Lab Results  Component Value Date   CHOLHDL 3.6 04/13/2022   Lab Results  Component Value Date   HGBA1C 5.9 (H) 12/28/2022      Assessment & Plan:   Problem List Items Addressed This Visit       Cardiovascular and Mediastinum   HTN (hypertension)    BP Readings from Last 1 Encounters:  02/01/23 120/84   Well-controlled with amlodipine, Zebeta and spironolactone Since she has leg swelling, advised to take half tablet  of amlodipine (5 mg dose now) Gets Zebeta from CHF clinic Counseled for compliance with the medications Advised DASH diet and moderate exercise/walking, at least 150 mins/week      Relevant Orders   CMP14+EGFR   Hot flashes due to menopause    Would avoid HRT due to her concurrent cardiac conditions and ILD Has tried Paxil without much relief Started Veozah Check CMP after 2 months      Relevant Medications   Fezolinetant (VEOZAH) 45 MG TABS   Pulmonary hypertension (HCC)    Likely from ILD due to scleroderma Followed by CHF clinic Appears euvolemic currently, on spironolactone and as needed Lasix currently On Opsumit        Endocrine   Hypothyroidism, adult    Lab Results  Component Value Date   TSH 1.610 12/28/2022   On NP thyroid 120 mg QD -needs to take it in a.m. on empty stomach Checked TSH and free T4        Nervous and Auditory   Cervical radiculopathy    Has chronic neck pain with radicular symptoms to LUE [facility]ed [facility] Had steroid injection and PT Advised to use wrist brace  for left thumb numbness - if persistent, can consider NCS        Other   Encounter for general adult medical examination with abnormal findings - Primary    Physical exam as documented. Fasting blood tests reviewed. Gets flu vaccine at her workplace.       Meds ordered this encounter  Medications   Fezolinetant (VEOZAH) 45 MG TABS    Sig: Take 1 tablet (45 mg total) by mouth daily.    Dispense:  30 tablet    Refill:  3    Follow-up: Return in about 6 months (around 08/01/2023) for HTN and hypothyroidism.    Anabel Halon, MD

## 2023-02-01 NOTE — Assessment & Plan Note (Addendum)
BP Readings from Last 1 Encounters:  02/01/23 120/84   Well-controlled with amlodipine, Zebeta and spironolactone Since she has leg swelling, advised to take half tablet of amlodipine (5 mg dose now) Gets Zebeta from CHF clinic Counseled for compliance with the medications Advised DASH diet and moderate exercise/walking, at least 150 mins/week

## 2023-02-01 NOTE — Assessment & Plan Note (Addendum)
Lab Results  Component Value Date   TSH 1.610 12/28/2022   On NP thyroid 120 mg QD -needs to take it in a.m. on empty stomach Checked TSH and free T4

## 2023-02-01 NOTE — Assessment & Plan Note (Signed)
Would avoid HRT due to her concurrent cardiac conditions and ILD Has tried Paxil without much relief Started Veozah Check CMP after 2 months

## 2023-02-01 NOTE — Assessment & Plan Note (Signed)
Physical exam as documented. Fasting blood tests reviewed. Gets flu vaccine at her workplace.

## 2023-02-01 NOTE — Assessment & Plan Note (Signed)
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

## 2023-02-01 NOTE — Patient Instructions (Signed)
Please start taking Amlodipine half tablet once daily. Please contact if your blood pressure starts running above 140/90.  Please apply wrist brace for thumb numbness. If persistent, please consider nerve conduction test with [facility].  Please continue to take medications as prescribed.  Please continue to follow low salt diet and perform moderate exercise/walking at least 150 mins/week.

## 2023-02-01 NOTE — Assessment & Plan Note (Signed)
Has chronic neck pain with radicular symptoms to LUE Followed by [facility] Had steroid injection and PT Advised to use wrist brace for left thumb numbness - if persistent, can consider NCS

## 2023-02-03 ENCOUNTER — Inpatient Hospital Stay (HOSPITAL_COMMUNITY)
Admission: EM | Admit: 2023-02-03 | Discharge: 2023-02-07 | DRG: 389 | Disposition: A | Discharge status: Home / Self Care | Payer: Commercial Managed Care - PPO | Attending: Internal Medicine | Admitting: Internal Medicine

## 2023-02-03 ENCOUNTER — Emergency Department (HOSPITAL_COMMUNITY): Payer: Commercial Managed Care - PPO

## 2023-02-03 ENCOUNTER — Encounter (HOSPITAL_COMMUNITY): Payer: Self-pay | Admitting: *Deleted

## 2023-02-03 ENCOUNTER — Other Ambulatory Visit: Payer: Self-pay

## 2023-02-03 DIAGNOSIS — K3184 Gastroparesis: Secondary | ICD-10-CM | POA: Diagnosis present

## 2023-02-03 DIAGNOSIS — K56609 Unspecified intestinal obstruction, unspecified as to partial versus complete obstruction: Secondary | ICD-10-CM | POA: Diagnosis not present

## 2023-02-03 DIAGNOSIS — K5651 Intestinal adhesions [bands], with partial obstruction: Principal | ICD-10-CM | POA: Diagnosis present

## 2023-02-03 DIAGNOSIS — N200 Calculus of kidney: Secondary | ICD-10-CM | POA: Diagnosis not present

## 2023-02-03 DIAGNOSIS — Z833 Family history of diabetes mellitus: Secondary | ICD-10-CM

## 2023-02-03 DIAGNOSIS — R1084 Generalized abdominal pain: Secondary | ICD-10-CM | POA: Diagnosis not present

## 2023-02-03 DIAGNOSIS — K219 Gastro-esophageal reflux disease without esophagitis: Secondary | ICD-10-CM | POA: Diagnosis present

## 2023-02-03 DIAGNOSIS — N390 Urinary tract infection, site not specified: Secondary | ICD-10-CM

## 2023-02-03 DIAGNOSIS — D259 Leiomyoma of uterus, unspecified: Secondary | ICD-10-CM | POA: Diagnosis not present

## 2023-02-03 DIAGNOSIS — M359 Systemic involvement of connective tissue, unspecified: Secondary | ICD-10-CM | POA: Diagnosis present

## 2023-02-03 DIAGNOSIS — I700 Atherosclerosis of aorta: Secondary | ICD-10-CM | POA: Diagnosis not present

## 2023-02-03 DIAGNOSIS — R109 Unspecified abdominal pain: Secondary | ICD-10-CM | POA: Diagnosis not present

## 2023-02-03 DIAGNOSIS — Z8249 Family history of ischemic heart disease and other diseases of the circulatory system: Secondary | ICD-10-CM

## 2023-02-03 DIAGNOSIS — I272 Pulmonary hypertension, unspecified: Secondary | ICD-10-CM | POA: Diagnosis present

## 2023-02-03 DIAGNOSIS — Z888 Allergy status to other drugs, medicaments and biological substances status: Secondary | ICD-10-CM

## 2023-02-03 DIAGNOSIS — M3500 Sicca syndrome, unspecified: Secondary | ICD-10-CM | POA: Diagnosis present

## 2023-02-03 DIAGNOSIS — K56600 Partial intestinal obstruction, unspecified as to cause: Secondary | ICD-10-CM

## 2023-02-03 DIAGNOSIS — I5032 Chronic diastolic (congestive) heart failure: Secondary | ICD-10-CM | POA: Diagnosis present

## 2023-02-03 DIAGNOSIS — Z9049 Acquired absence of other specified parts of digestive tract: Secondary | ICD-10-CM

## 2023-02-03 DIAGNOSIS — J9811 Atelectasis: Secondary | ICD-10-CM | POA: Diagnosis not present

## 2023-02-03 DIAGNOSIS — Z79899 Other long term (current) drug therapy: Secondary | ICD-10-CM

## 2023-02-03 DIAGNOSIS — E876 Hypokalemia: Secondary | ICD-10-CM | POA: Diagnosis present

## 2023-02-03 DIAGNOSIS — J849 Interstitial pulmonary disease, unspecified: Secondary | ICD-10-CM | POA: Diagnosis present

## 2023-02-03 DIAGNOSIS — I10 Essential (primary) hypertension: Secondary | ICD-10-CM | POA: Diagnosis present

## 2023-02-03 DIAGNOSIS — E039 Hypothyroidism, unspecified: Secondary | ICD-10-CM | POA: Diagnosis present

## 2023-02-03 DIAGNOSIS — M349 Systemic sclerosis, unspecified: Secondary | ICD-10-CM | POA: Diagnosis present

## 2023-02-03 DIAGNOSIS — R0602 Shortness of breath: Secondary | ICD-10-CM | POA: Diagnosis not present

## 2023-02-03 DIAGNOSIS — I110 Hypertensive heart disease with heart failure: Secondary | ICD-10-CM | POA: Diagnosis present

## 2023-02-03 LAB — COMPREHENSIVE METABOLIC PANEL
ALT: 17 U/L (ref 0–44)
AST: 23 U/L (ref 15–41)
Albumin: 4.6 g/dL (ref 3.5–5.0)
Alkaline Phosphatase: 108 U/L (ref 38–126)
Anion gap: 13 (ref 5–15)
BUN: 14 mg/dL (ref 6–20)
CO2: 25 mmol/L (ref 22–32)
Calcium: 11.2 mg/dL — ABNORMAL HIGH (ref 8.9–10.3)
Chloride: 103 mmol/L (ref 98–111)
Creatinine, Ser: 0.6 mg/dL (ref 0.44–1.00)
GFR, Estimated: >60 mL/min (ref >60)
Glucose, Bld: 129 mg/dL — ABNORMAL HIGH (ref 70–99)
Potassium: 3.3 mmol/L — ABNORMAL LOW (ref 3.5–5.1)
Sodium: 141 mmol/L (ref 135–145)
Total Bilirubin: 0.6 mg/dL (ref 0.3–1.2)
Total Protein: 8.5 g/dL — ABNORMAL HIGH (ref 6.5–8.1)

## 2023-02-03 LAB — CBC
HCT: 44.8 % (ref 36.0–46.0)
Hemoglobin: 13.8 g/dL (ref 12.0–15.0)
MCH: 27 pg (ref 26.0–34.0)
MCHC: 30.8 g/dL (ref 30.0–36.0)
MCV: 87.5 fL (ref 80.0–100.0)
Platelets: 256 10*3/uL (ref 150–400)
RBC: 5.12 MIL/uL — ABNORMAL HIGH (ref 3.87–5.11)
RDW: 13.3 % (ref 11.5–15.5)
WBC: 11.6 10*3/uL — ABNORMAL HIGH (ref 4.0–10.5)
nRBC: 0 % (ref 0.0–0.2)

## 2023-02-03 LAB — LIPASE, BLOOD: Lipase: 22 U/L (ref 11–51)

## 2023-02-03 LAB — URINALYSIS, ROUTINE W REFLEX MICROSCOPIC
Bilirubin Urine: NEGATIVE
Glucose, UA: NEGATIVE mg/dL
Hgb urine dipstick: NEGATIVE
Ketones, ur: 5 mg/dL — AB
Nitrite: NEGATIVE
Protein, ur: >=300 mg/dL — AB
Specific Gravity, Urine: 1.019 (ref 1.005–1.030)
pH: 7 (ref 5.0–8.0)

## 2023-02-03 LAB — PREGNANCY, URINE: Preg Test, Ur: NEGATIVE

## 2023-02-03 MED ORDER — ONDANSETRON HCL 4 MG/2ML IJ SOLN
4.0000 mg | Freq: Once | INTRAMUSCULAR | Status: AC
  Administered 2023-02-03: 4 mg via INTRAVENOUS
  Filled 2023-02-03: qty 2

## 2023-02-03 MED ORDER — SODIUM CHLORIDE 0.9 % IV BOLUS
1000.0000 mL | Freq: Once | INTRAVENOUS | Status: AC
  Administered 2023-02-03: 1000 mL via INTRAVENOUS

## 2023-02-03 MED ORDER — HYDROMORPHONE HCL 1 MG/ML IJ SOLN
1.0000 mg | Freq: Once | INTRAMUSCULAR | Status: AC
  Administered 2023-02-03: 1 mg via INTRAVENOUS
  Filled 2023-02-03: qty 1

## 2023-02-03 MED ORDER — IOHEXOL 300 MG/ML  SOLN
100.0000 mL | Freq: Once | INTRAMUSCULAR | Status: AC | PRN
  Administered 2023-02-03: 100 mL via INTRAVENOUS

## 2023-02-03 NOTE — ED Provider Notes (Incomplete)
[place] EMERGENCY DEPARTMENT AT [facility] Provider Note   CSN: [id number] Arrival date & time: 02/03/23  1723     History {Add pertinent medical, surgical, social history, OB history to HPI:1} Chief Complaint  Patient presents with  . Abdominal Pain    Karen [name] is a 52 y.o. female.  Patient reports that she had diarrhea earlier this a.m.  Patient reports she has had progressively worsening abdominal pain during the day.  Patient reports that she has had nausea and some vomiting.  Patient has a past medical history of gastroparesis as well as a history of small bowel obstructions.  Patient reports that she has the same pain that she has had in the past with previous bowel obstructions.  Patient has a past medical history of scleroderma, congestive heart failure hypertension gastroparesis GERD Sjogren's and hypertension.  The history is provided by the patient and the spouse. No language interpreter was used.  Abdominal Pain Pain location:  Generalized Pain quality: aching   Pain radiates to:  Does not radiate Timing:  Constant Progression:  Worsening Chronicity:  New Context: not sick contacts and not suspicious food intake   Relieved by:  Nothing Worsened by:  Nothing Ineffective treatments:  Antacids Associated symptoms: diarrhea   Risk factors: no alcohol abuse        Home Medications Prior to Admission medications   Medication Sig Start Date End Date Taking? Authorizing Provider  acetaminophen (TYLENOL) 325 MG tablet Take 2 tablets (650 mg total) by mouth every 6 (six) hours as needed for mild pain, fever or headache (or Fever >/= 101). 02/15/20   Shon Hale, MD  amLODipine (NORVASC) 10 MG tablet Take 1 tablet (10 mg total) by mouth daily. 08/17/22   Anabel Halon, MD  bisoprolol (ZEBETA) 5 MG tablet Take 1 tablet (5 mg total) by mouth daily. 08/24/22   Bensimhon, Bevelyn Buckles, MD  clobetasol cream (TEMOVATE) 0.05 % Apply 1 Application topically  daily as needed (For rash).    [provider]  Fezolinetant (VEOZAH) 45 MG TABS Take 1 tablet (45 mg total) by mouth daily. 02/01/23   Anabel Halon, MD  Fluocinolone Acetonide Scalp 0.01 % OIL Apply to affected frontal scalp 4 (four) times a week. Not to face 07/27/22     furosemide (LASIX) 40 MG tablet Take 1 tablet (40 mg total) by mouth 3 (three) times a week. Mon, Wed, Fri Patient taking differently: Take 40 mg by mouth daily as needed for fluid. 08/25/22   Bensimhon, Bevelyn Buckles, MD  hydrOXYzine (ATARAX) 25 MG tablet Take 1-2 tablets (25-50 mg total) by mouth every 6 (six) hours as needed for itching. 01/11/23   Gilda Crease, MD  macitentan (OPSUMIT) 10 MG tablet Take 1 tablet (10 mg total) by mouth daily. 03/24/22   Bensimhon, Bevelyn Buckles, MD  Multiple Vitamin (MULTIVITAMIN WITH MINERALS) TABS tablet Take 1 tablet by mouth daily.    [provider]  mycophenolate (CELLCEPT) 500 MG tablet Take 3 tablets (1,500 mg total) by mouth every morning AND 3 tablets (1,500 mg total) every evening. 01/18/22   Mannam, Colbert Coyer, MD  Nintedanib (OFEV) 150 MG CAPS Take 1 capsule (150 mg total) by mouth 2 (two) times daily. 08/28/22   Quentin Angst, MD  NP THYROID 120 MG tablet Take 1 tablet (120 mg total) by mouth daily before breakfast. 08/18/22   Anabel Halon, MD  ondansetron (ZOFRAN) 4 MG tablet Take 1 tablet (4 mg total) by  mouth every 8 (eight) hours as needed for nausea or vomiting. 10/09/22   Anabel Halon, MD  pantoprazole (PROTONIX) 40 MG tablet Take 1 tablet (40 mg total) by mouth 2 (two) times daily. 10/26/22   Anabel Halon, MD  potassium chloride 20 MEQ/15ML (10%) SOLN Take 15 mLs (20 mEq total) by mouth daily with furosemide Patient taking differently: Take 20 mEq by mouth daily as needed (For low potassium). 08/17/22   Anabel Halon, MD  spironolactone (ALDACTONE) 25 MG tablet Take 1 tablet (25 mg total) by mouth 2 (two) times daily. 12/21/22   Anabel Halon, MD   triamcinolone cream (KENALOG) 0.1 % Apply 1 Application topically daily as needed (For rash).    [provider]  PARoxetine (PAXIL) 10 MG tablet Take 1 tablet (10 mg total) by mouth daily as directed 10/07/20 12/02/20        Allergies    Lisinopril and Tocilizumab    Review of Systems   Review of Systems  Gastrointestinal:  Positive for abdominal pain and diarrhea.  All other systems reviewed and are negative.   Physical Exam Updated Vital Signs BP 129/83   Pulse 81   Temp 99 F (37.2 C) (Oral)   Resp (!) 22   Ht 5\' 6"  (1.676 m)   SpO2 96%   BMI 22.47 kg/m  Physical Exam Vitals and nursing note reviewed.  Constitutional:      Appearance: She is well-developed.  HENT:     Head: Normocephalic.  Cardiovascular:     Rate and Rhythm: Normal rate and regular rhythm.  Pulmonary:     Effort: Pulmonary effort is normal.  Abdominal:     General: Abdomen is flat. Bowel sounds are normal.     Palpations: Abdomen is soft.     Tenderness: There is abdominal tenderness.  Musculoskeletal:        General: Normal range of motion.     Cervical back: Normal range of motion.  Skin:    General: Skin is warm.  Neurological:     General: No focal deficit present.     Mental Status: She is alert and oriented to person, place, and time.  Psychiatric:        Mood and Affect: Mood normal.     ED Results / Procedures / Treatments   Labs (all labs ordered are listed, but only abnormal results are displayed) Labs Reviewed  COMPREHENSIVE METABOLIC PANEL - Abnormal; Notable for the following components:      Result Value   Potassium 3.3 (*)    Glucose, Bld 129 (*)    Calcium 11.2 (*)    Total Protein 8.5 (*)    All other components within normal limits  CBC - Abnormal; Notable for the following components:   WBC 11.6 (*)    RBC 5.12 (*)    All other components within normal limits  URINALYSIS, ROUTINE W REFLEX MICROSCOPIC - Abnormal; Notable for the following components:    APPearance HAZY (*)    Ketones, ur 5 (*)    Protein, ur >=300 (*)    Leukocytes,Ua SMALL (*)    Bacteria, UA MANY (*)    All other components within normal limits  LIPASE, BLOOD  PREGNANCY, URINE    EKG None  Radiology DG Chest 2 View  Result Date: 02/03/2023 CLINICAL DATA:  Shortness of breath EXAM: CHEST - 2 VIEW COMPARISON:  10/10/2022 FINDINGS: Cardiac shadow is stable. Aortic calcifications are noted. Lungs are well aerated bilaterally.  Mild left basilar atelectasis is seen. No sizable effusion is noted. No bony abnormality is noted. IMPRESSION: Mild left basilar atelectasis. Electronically Signed   By: Alcide Clever M.D.   On: 02/03/2023 19:39    Procedures Procedures  {Document cardiac monitor, telemetry assessment procedure when appropriate:1}  Medications Ordered in ED Medications  sodium chloride 0.9 % bolus 1,000 mL (has no administration in time range)  ondansetron (ZOFRAN) injection 4 mg (has no administration in time range)  HYDROmorphone (DILAUDID) injection 1 mg (has no administration in time range)    ED Course/ Medical Decision Making/ A&P   {   Click here for ABCD2, HEART and other calculatorsREFRESH Note before signing :1}                              Medical Decision Making Patient complains of abdominal pain.  Patient has a history of gastroparesis as well as small bowel obstructions.  Amount and/or Complexity of Data Reviewed Independent Historian:     Details: Patient is here with family who is supportive Labs: ordered. Decision-making details documented in ED Course.    Details: Labs ordered reviewed and interpreted patient has a elevated white blood cell count of 11.2 Radiology: ordered.    Details: CT scan abdomen and pelvis ordered  Risk Prescription drug management. Risk Details: Patient given IV fluids x 1 L Zofran and Dilaudid for pain.     {Document critical care time when appropriate:1} {Document review of labs and clinical decision  tools ie heart score, Chads2Vasc2 etc:1}  {Document your independent review of radiology images, and any outside records:1} {Document your discussion with family members, caretakers, and with consultants:1} {Document social determinants of health affecting pt's care:1} {Document your decision making why or why not admission, treatments were needed:1} Final Clinical Impression(s) / ED Diagnoses Final diagnoses:  Generalized abdominal pain    Rx / DC Orders ED Discharge Orders     None

## 2023-02-03 NOTE — ED Triage Notes (Signed)
Pt with abd pain with emesis and generalize weakness, that started today.

## 2023-02-03 NOTE — ED Notes (Signed)
Pt with SOB while in waiting room, pt pulled to triage to reassess vitals and EKG.

## 2023-02-03 NOTE — ED Provider Notes (Signed)
[facility] AT [facility] Provider Note   CSN: [id number] Arrival date & time: 02/03/23  1723     History  Chief Complaint  Patient presents with   Abdominal Pain    Karen [name] is a 52 y.o. female.  Patient reports that she had diarrhea earlier this a.m.  Patient reports she has had progressively worsening abdominal pain during the day.  Patient reports that she has had nausea and some vomiting.  Patient has a past medical history of gastroparesis as well as a history of small bowel obstructions.  Patient reports that she has the same pain that she has had in the past with previous bowel obstructions.  Patient has a past medical history of scleroderma, congestive heart failure hypertension gastroparesis GERD Sjogren's and hypertension.  The history is provided by the patient and the spouse. No language interpreter was used.  Abdominal Pain Pain location:  Generalized Pain quality: aching   Pain radiates to:  Does not radiate Timing:  Constant Progression:  Worsening Chronicity:  New Context: not sick contacts and not suspicious food intake   Relieved by:  Nothing Worsened by:  Nothing Ineffective treatments:  Antacids Associated symptoms: diarrhea   Risk factors: no alcohol abuse        Home Medications Prior to Admission medications   Medication Sig Start Date End Date Taking? Authorizing Provider  acetaminophen (TYLENOL) 325 MG tablet Take 2 tablets (650 mg total) by mouth every 6 (six) hours as needed for mild pain, fever or headache (or Fever >/= 101). 02/15/20   Shon Hale, MD  amLODipine (NORVASC) 10 MG tablet Take 1 tablet (10 mg total) by mouth daily. 08/17/22   Anabel Halon, MD  bisoprolol (ZEBETA) 5 MG tablet Take 1 tablet (5 mg total) by mouth daily. 08/24/22   Bensimhon, Bevelyn Buckles, MD  clobetasol cream (TEMOVATE) 0.05 % Apply 1 Application topically daily as needed (For rash).    [provider]  Fezolinetant  (VEOZAH) 45 MG TABS Take 1 tablet (45 mg total) by mouth daily. 02/01/23   Anabel Halon, MD  Fluocinolone Acetonide Scalp 0.01 % OIL Apply to affected frontal scalp 4 (four) times a week. Not to face 07/27/22     furosemide (LASIX) 40 MG tablet Take 1 tablet (40 mg total) by mouth 3 (three) times a week. Mon, Wed, Fri Patient taking differently: Take 40 mg by mouth daily as needed for fluid. 08/25/22   Bensimhon, Bevelyn Buckles, MD  hydrOXYzine (ATARAX) 25 MG tablet Take 1-2 tablets (25-50 mg total) by mouth every 6 (six) hours as needed for itching. 01/11/23   Gilda Crease, MD  macitentan (OPSUMIT) 10 MG tablet Take 1 tablet (10 mg total) by mouth daily. 03/24/22   Bensimhon, Bevelyn Buckles, MD  Multiple Vitamin (MULTIVITAMIN WITH MINERALS) TABS tablet Take 1 tablet by mouth daily.    [provider]  mycophenolate (CELLCEPT) 500 MG tablet Take 3 tablets (1,500 mg total) by mouth every morning AND 3 tablets (1,500 mg total) every evening. 01/18/22   Mannam, Colbert Coyer, MD  Nintedanib (OFEV) 150 MG CAPS Take 1 capsule (150 mg total) by mouth 2 (two) times daily. 08/28/22   Quentin Angst, MD  NP THYROID 120 MG tablet Take 1 tablet (120 mg total) by mouth daily before breakfast. 08/18/22   Anabel Halon, MD  ondansetron (ZOFRAN) 4 MG tablet Take 1 tablet (4 mg total) by mouth every 8 (eight) hours as needed for nausea  or vomiting. 10/09/22   Anabel Halon, MD  pantoprazole (PROTONIX) 40 MG tablet Take 1 tablet (40 mg total) by mouth 2 (two) times daily. 10/26/22   Anabel Halon, MD  potassium chloride 20 MEQ/15ML (10%) SOLN Take 15 mLs (20 mEq total) by mouth daily with furosemide Patient taking differently: Take 20 mEq by mouth daily as needed (For low potassium). 08/17/22   Anabel Halon, MD  spironolactone (ALDACTONE) 25 MG tablet Take 1 tablet (25 mg total) by mouth 2 (two) times daily. 12/21/22   Anabel Halon, MD  triamcinolone cream (KENALOG) 0.1 % Apply 1 Application topically daily  as needed (For rash).    [provider]  PARoxetine (PAXIL) 10 MG tablet Take 1 tablet (10 mg total) by mouth daily as directed 10/07/20 12/02/20        Allergies    Lisinopril and Tocilizumab    Review of Systems   Review of Systems  Gastrointestinal:  Positive for abdominal pain and diarrhea.  All other systems reviewed and are negative.   Physical Exam Updated Vital Signs BP 129/83   Pulse 81   Temp 99 F (37.2 C) (Oral)   Resp (!) 22   Ht 5\' 6"  (1.676 m)   SpO2 96%   BMI 22.47 kg/m  Physical Exam Vitals and nursing note reviewed.  Constitutional:      Appearance: She is well-developed.  HENT:     Head: Normocephalic.  Cardiovascular:     Rate and Rhythm: Normal rate and regular rhythm.  Pulmonary:     Effort: Pulmonary effort is normal.  Abdominal:     General: Abdomen is flat. Bowel sounds are normal.     Palpations: Abdomen is soft.     Tenderness: There is abdominal tenderness.  Musculoskeletal:        General: Normal range of motion.     Cervical back: Normal range of motion.  Skin:    General: Skin is warm.  Neurological:     General: No focal deficit present.     Mental Status: She is alert and oriented to person, place, and time.  Psychiatric:        Mood and Affect: Mood normal.     ED Results / Procedures / Treatments   Labs (all labs ordered are listed, but only abnormal results are displayed) Labs Reviewed  COMPREHENSIVE METABOLIC PANEL - Abnormal; Notable for the following components:      Result Value   Potassium 3.3 (*)    Glucose, Bld 129 (*)    Calcium 11.2 (*)    Total Protein 8.5 (*)    All other components within normal limits  CBC - Abnormal; Notable for the following components:   WBC 11.6 (*)    RBC 5.12 (*)    All other components within normal limits  URINALYSIS, ROUTINE W REFLEX MICROSCOPIC - Abnormal; Notable for the following components:   APPearance HAZY (*)    Ketones, ur 5 (*)    Protein, ur >=300 (*)     Leukocytes,Ua SMALL (*)    Bacteria, UA MANY (*)    All other components within normal limits  LIPASE, BLOOD  PREGNANCY, URINE    EKG None  Radiology DG Chest 2 View  Result Date: 02/03/2023 CLINICAL DATA:  Shortness of breath EXAM: CHEST - 2 VIEW COMPARISON:  10/10/2022 FINDINGS: Cardiac shadow is stable. Aortic calcifications are noted. Lungs are well aerated bilaterally. Mild left basilar atelectasis is seen. No sizable effusion  is noted. No bony abnormality is noted. IMPRESSION: Mild left basilar atelectasis. Electronically Signed   By: Alcide Clever M.D.   On: 02/03/2023 19:39    Procedures Procedures    Medications Ordered in ED Medications  sodium chloride 0.9 % bolus 1,000 mL (has no administration in time range)  ondansetron (ZOFRAN) injection 4 mg (has no administration in time range)  HYDROmorphone (DILAUDID) injection 1 mg (has no administration in time range)    ED Course/ Medical Decision Making/ A&P                                 Medical Decision Making Patient complains of abdominal pain.  Patient has a history of gastroparesis as well as small bowel obstructions.  Amount and/or Complexity of Data Reviewed Independent Historian:     Details: Patient is here with family who is supportive Labs: ordered. Decision-making details documented in ED Course.    Details: Labs ordered reviewed and interpreted patient has a elevated white blood cell count of 11.2 Radiology: ordered.    Details: CT scan abdomen and pelvis ordered Discussion of management or test interpretation with external provider(s): I discussed with Dr. Kathe Mariner general surgeon.  She advised ng tube, iv fluids and npo  Hospitalist consulted for admission   Risk Prescription drug management. Risk Details: Patient given IV fluids x 1 L Zofran and Dilaudid for pain.           Final Clinical Impression(s) / ED Diagnoses Final diagnoses:  Generalized abdominal pain  Partial small bowel  obstruction [facility])    Rx / DC Orders ED Discharge Orders     None         Elson Areas, Cordelia Poche 02/04/23 0018    Royanne Foots, DO 02/06/23 1442

## 2023-02-04 ENCOUNTER — Inpatient Hospital Stay (HOSPITAL_COMMUNITY): Payer: Commercial Managed Care - PPO

## 2023-02-04 DIAGNOSIS — I5032 Chronic diastolic (congestive) heart failure: Secondary | ICD-10-CM | POA: Diagnosis not present

## 2023-02-04 DIAGNOSIS — Z8249 Family history of ischemic heart disease and other diseases of the circulatory system: Secondary | ICD-10-CM | POA: Diagnosis not present

## 2023-02-04 DIAGNOSIS — Z833 Family history of diabetes mellitus: Secondary | ICD-10-CM | POA: Diagnosis not present

## 2023-02-04 DIAGNOSIS — R140 Abdominal distension (gaseous): Secondary | ICD-10-CM | POA: Diagnosis not present

## 2023-02-04 DIAGNOSIS — K3184 Gastroparesis: Secondary | ICD-10-CM | POA: Diagnosis present

## 2023-02-04 DIAGNOSIS — K5651 Intestinal adhesions [bands], with partial obstruction: Secondary | ICD-10-CM | POA: Diagnosis not present

## 2023-02-04 DIAGNOSIS — M349 Systemic sclerosis, unspecified: Secondary | ICD-10-CM | POA: Diagnosis not present

## 2023-02-04 DIAGNOSIS — K219 Gastro-esophageal reflux disease without esophagitis: Secondary | ICD-10-CM | POA: Diagnosis not present

## 2023-02-04 DIAGNOSIS — I272 Pulmonary hypertension, unspecified: Secondary | ICD-10-CM | POA: Diagnosis not present

## 2023-02-04 DIAGNOSIS — E876 Hypokalemia: Secondary | ICD-10-CM | POA: Diagnosis not present

## 2023-02-04 DIAGNOSIS — M3500 Sicca syndrome, unspecified: Secondary | ICD-10-CM | POA: Diagnosis present

## 2023-02-04 DIAGNOSIS — N390 Urinary tract infection, site not specified: Secondary | ICD-10-CM

## 2023-02-04 DIAGNOSIS — I10 Essential (primary) hypertension: Secondary | ICD-10-CM

## 2023-02-04 DIAGNOSIS — Z79899 Other long term (current) drug therapy: Secondary | ICD-10-CM | POA: Diagnosis not present

## 2023-02-04 DIAGNOSIS — Z888 Allergy status to other drugs, medicaments and biological substances status: Secondary | ICD-10-CM | POA: Diagnosis not present

## 2023-02-04 DIAGNOSIS — Z4682 Encounter for fitting and adjustment of non-vascular catheter: Secondary | ICD-10-CM | POA: Diagnosis not present

## 2023-02-04 DIAGNOSIS — I110 Hypertensive heart disease with heart failure: Secondary | ICD-10-CM | POA: Diagnosis not present

## 2023-02-04 DIAGNOSIS — K56609 Unspecified intestinal obstruction, unspecified as to partial versus complete obstruction: Secondary | ICD-10-CM | POA: Diagnosis not present

## 2023-02-04 DIAGNOSIS — J849 Interstitial pulmonary disease, unspecified: Secondary | ICD-10-CM | POA: Diagnosis not present

## 2023-02-04 DIAGNOSIS — Z9049 Acquired absence of other specified parts of digestive tract: Secondary | ICD-10-CM | POA: Diagnosis not present

## 2023-02-04 DIAGNOSIS — E039 Hypothyroidism, unspecified: Secondary | ICD-10-CM | POA: Diagnosis not present

## 2023-02-04 DIAGNOSIS — N3000 Acute cystitis without hematuria: Secondary | ICD-10-CM | POA: Diagnosis not present

## 2023-02-04 LAB — COMPREHENSIVE METABOLIC PANEL
ALT: 14 U/L (ref 0–44)
AST: 19 U/L (ref 15–41)
Albumin: 3.9 g/dL (ref 3.5–5.0)
Alkaline Phosphatase: 90 U/L (ref 38–126)
Anion gap: 10 (ref 5–15)
BUN: 10 mg/dL (ref 6–20)
CO2: 26 mmol/L (ref 22–32)
Calcium: 9.4 mg/dL (ref 8.9–10.3)
Chloride: 105 mmol/L (ref 98–111)
Creatinine, Ser: 0.57 mg/dL (ref 0.44–1.00)
GFR, Estimated: >60 mL/min (ref >60)
Glucose, Bld: 90 mg/dL (ref 70–99)
Potassium: 3.8 mmol/L (ref 3.5–5.1)
Sodium: 141 mmol/L (ref 135–145)
Total Bilirubin: 0.5 mg/dL (ref 0.3–1.2)
Total Protein: 7.3 g/dL (ref 6.5–8.1)

## 2023-02-04 LAB — CBC WITH DIFFERENTIAL/PLATELET
Abs Immature Granulocytes: 0.02 10*3/uL (ref 0.00–0.07)
Basophils Absolute: 0 10*3/uL (ref 0.0–0.1)
Basophils Relative: 0 %
Eosinophils Absolute: 0.1 10*3/uL (ref 0.0–0.5)
Eosinophils Relative: 2 %
HCT: 40.5 % (ref 36.0–46.0)
Hemoglobin: 12.1 g/dL (ref 12.0–15.0)
Immature Granulocytes: 0 %
Lymphocytes Relative: 33 %
Lymphs Abs: 2.3 10*3/uL (ref 0.7–4.0)
MCH: 27.2 pg (ref 26.0–34.0)
MCHC: 29.9 g/dL — ABNORMAL LOW (ref 30.0–36.0)
MCV: 91 fL (ref 80.0–100.0)
Monocytes Absolute: 0.6 10*3/uL (ref 0.1–1.0)
Monocytes Relative: 8 %
Neutro Abs: 3.9 10*3/uL (ref 1.7–7.7)
Neutrophils Relative %: 57 %
Platelets: 189 10*3/uL (ref 150–400)
RBC: 4.45 MIL/uL (ref 3.87–5.11)
RDW: 13.5 % (ref 11.5–15.5)
WBC: 6.9 10*3/uL (ref 4.0–10.5)
nRBC: 0 % (ref 0.0–0.2)

## 2023-02-04 LAB — MAGNESIUM: Magnesium: 1.2 mg/dL — ABNORMAL LOW (ref 1.7–2.4)

## 2023-02-04 LAB — TSH: TSH: 0.986 u[IU]/mL (ref 0.350–4.500)

## 2023-02-04 MED ORDER — MORPHINE SULFATE (PF) 2 MG/ML IV SOLN
2.0000 mg | INTRAVENOUS | Status: DC | PRN
  Administered 2023-02-04 (×2): 2 mg via INTRAVENOUS
  Filled 2023-02-04 (×2): qty 1

## 2023-02-04 MED ORDER — MAGNESIUM SULFATE 4 GM/100ML IV SOLN
4.0000 g | Freq: Once | INTRAVENOUS | Status: AC
  Administered 2023-02-04: 4 g via INTRAVENOUS
  Filled 2023-02-04: qty 100

## 2023-02-04 MED ORDER — BISACODYL 10 MG RE SUPP
10.0000 mg | Freq: Every day | RECTAL | Status: DC
  Administered 2023-02-06 – 2023-02-07 (×2): 10 mg via RECTAL
  Filled 2023-02-04 (×3): qty 1

## 2023-02-04 MED ORDER — ACETAMINOPHEN 325 MG PO TABS
650.0000 mg | ORAL_TABLET | Freq: Four times a day (QID) | ORAL | Status: DC | PRN
  Administered 2023-02-05: 650 mg via ORAL
  Filled 2023-02-04: qty 2

## 2023-02-04 MED ORDER — POTASSIUM CHLORIDE 10 MEQ/100ML IV SOLN
10.0000 meq | INTRAVENOUS | Status: AC
  Administered 2023-02-04 (×4): 10 meq via INTRAVENOUS
  Filled 2023-02-04 (×4): qty 100

## 2023-02-04 MED ORDER — SUMATRIPTAN SUCCINATE 6 MG/0.5ML ~~LOC~~ SOLN
6.0000 mg | Freq: Once | SUBCUTANEOUS | Status: AC
  Administered 2023-02-04: 6 mg via SUBCUTANEOUS
  Filled 2023-02-04 (×2): qty 0.5

## 2023-02-04 MED ORDER — ONDANSETRON HCL 4 MG PO TABS: 4.0000 mg | ORAL_TABLET | Freq: Four times a day (QID) | ORAL | Status: DC | PRN

## 2023-02-04 MED ORDER — SODIUM CHLORIDE 0.9 % IV SOLN: INTRAVENOUS | Status: DC

## 2023-02-04 MED ORDER — ACETAMINOPHEN 650 MG RE SUPP: 650.0000 mg | Freq: Four times a day (QID) | RECTAL | Status: DC | PRN

## 2023-02-04 MED ORDER — MAGNESIUM SULFATE 2 GM/50ML IV SOLN
2.0000 g | Freq: Once | INTRAVENOUS | Status: DC
Start: 2023-02-04 — End: 2023-02-04

## 2023-02-04 MED ORDER — SODIUM CHLORIDE 0.9 % IV SOLN
1.0000 g | INTRAVENOUS | Status: AC
  Administered 2023-02-04 – 2023-02-06 (×3): 1 g via INTRAVENOUS
  Filled 2023-02-04 (×3): qty 10

## 2023-02-04 MED ORDER — HYDRALAZINE HCL 20 MG/ML IJ SOLN: 10.0000 mg | INTRAMUSCULAR | Status: DC | PRN

## 2023-02-04 MED ORDER — PANTOPRAZOLE SODIUM 40 MG IV SOLR
40.0000 mg | Freq: Two times a day (BID) | INTRAVENOUS | Status: DC
  Administered 2023-02-04 – 2023-02-07 (×7): 40 mg via INTRAVENOUS
  Filled 2023-02-04 (×7): qty 10

## 2023-02-04 MED ORDER — ONDANSETRON HCL 4 MG/2ML IJ SOLN: 4.0000 mg | Freq: Four times a day (QID) | INTRAMUSCULAR | Status: DC | PRN

## 2023-02-04 MED ORDER — LABETALOL HCL 5 MG/ML IV SOLN: 10.0000 mg | INTRAVENOUS | Status: DC | PRN

## 2023-02-04 NOTE — Plan of Care (Signed)

## 2023-02-04 NOTE — Assessment & Plan Note (Addendum)
-   CT abdomen shows partial small bowel obstruction with transition zone noted in anterior abdomen -Patient still declining NG tube this morning but no further vomiting and nausea appears controlled - Continue fluids - Remain n.p.o. - Follow-up further general surgery recommendations

## 2023-02-04 NOTE — Assessment & Plan Note (Addendum)
- 

## 2023-02-04 NOTE — Assessment & Plan Note (Addendum)
-   Resume amlodipine and bisoprolol - Continue PRN meds

## 2023-02-04 NOTE — Assessment & Plan Note (Signed)
-   hold Ofev

## 2023-02-04 NOTE — Progress Notes (Signed)
Patient refused Doculax suppository. Notified Dr. Robyne Peers.

## 2023-02-04 NOTE — H&P (Signed)
History and Physical    Patient: Karen [name] VZD:[id number] DOB: Feb 11, 1971 DOA: 02/03/2023 DOS: the patient was seen and examined on 02/04/2023 PCP: Anabel Halon, MD  Patient coming from: Home  Chief Complaint:  Chief Complaint  Patient presents with   Abdominal Pain   HPI: Karen [name] is a 52 y.o. female with medical history significant of CHF, GERD, hypothyroidism, hypertension, scleroderma, and more presents the ED with a chief complaint of abdominal pain.  Patient reports that she had abdominal pain that started right after breakfast on Saturday.  She had nausea and vomiting 3-4 times.  It was nonbloody.  She reports she threw up until she can throw up anymore.  She had a soft bowel movement at home in the morning and then a liquid bowel movement in the ER.  Both were nonbloody.  Her last normal meal was breakfast, she has not attempted p.o. intake since then.  She describes her pain is epigastric and diffuse.  It is sharp and crampy.  It was constant until she got pain medication.  Patient denies any fever.  She reports otherwise she was in her normal state of health.  Patient does have a history of having SBO's, but has not previously required surgical correction.  Patient does not smoke and does not drink.  Patient is full code. Review of Systems: As mentioned in the history of present illness. All other systems reviewed and are negative. Past Medical History:  Diagnosis Date   Acute respiratory failure with hypoxia due to flash pulmonary edema requiring intubation 02/13/2020   Calculus of gallbladder with acute cholecystitis without obstruction    CHF (congestive heart failure) (HCC)    GERD (gastroesophageal reflux disease)    Hypertension    Hypothyroidism    ILD (interstitial lung disease) (HCC) 02/08/2018   Interstitial lung disease (HCC)    Multinodular thyroid    benign FNA 08/2017.   Perimenopause 02/27/2019   Pulmonary edema    Scleroderma (HCC)    Status post  laparoscopic cholecystectomy 02/13/20 02/13/2020   Vitamin D deficiency disease 02/27/2019   Past Surgical History:  Procedure Laterality Date   ABDOMINAL HERNIA REPAIR     BUNIONECTOMY Bilateral 10/2018   CHOLECYSTECTOMY N/A 02/13/2020   Procedure: LAPAROSCOPIC CHOLECYSTECTOMY;  Surgeon: Franky Macho, MD;  Location: [facility] ORS;  Service: General;  Laterality: N/A;   COLONOSCOPY WITH PROPOFOL N/A 01/02/2019   Procedure: COLONOSCOPY WITH PROPOFOL;  Surgeon: Corbin Ade, MD;  Location: [facility] ENDO SUITE;  Service: Endoscopy;  Laterality: N/A;  12:30pm   ESOPHAGOGASTRODUODENOSCOPY (EGD) WITH PROPOFOL N/A 01/28/2018   erosive reflux esophagitis, patulous EG Junction, no dilation, incomplete EGD due to retained food in stomach. GES thereafter with delayed gastric emptying.    RIGHT HEART CATH N/A 09/27/2017   Procedure: RIGHT HEART CATH;  Surgeon: Dolores Patty, MD;  [facility] INVASIVE CV LAB;  Service: Cardiovascular;  Laterality: N/A;   RIGHT HEART CATH N/A 09/05/2019   Procedure: RIGHT HEART CATH;  Surgeon: Dolores Patty, MD;  Location: [facility] INVASIVE CV LAB;  Service: Cardiovascular;  Laterality: N/A;   RIGHT/LEFT HEART CATH AND CORONARY ANGIOGRAPHY N/A 08/18/2021   Procedure: RIGHT/LEFT HEART CATH AND CORONARY ANGIOGRAPHY;  Surgeon: Dolores Patty, MD;  Location: [facility] INVASIVE CV LAB;  Service: Cardiovascular;  Laterality: N/A;   UTERINE FIBROID SURGERY     Social History:  reports that she has never smoked. She has never used smokeless tobacco. She reports that she does not drink alcohol and  does not use drugs.  Allergies  Allergen Reactions   Lisinopril Hives, Swelling and Other (See Comments)   Tocilizumab Other (See Comments)    Swelling and shortness of breath sharpe pains in back and chest tightness.    Family History  Problem Relation Age of Onset   Hypertension Father    Hypertension Sister    Hypertension Brother    Diabetes Maternal Grandfather    Diabetes Maternal  Uncle    Diabetes Mother    Colon cancer Neg Hx     Prior to Admission medications   Medication Sig Start Date End Date Taking? Authorizing Provider  acetaminophen (TYLENOL) 325 MG tablet Take 2 tablets (650 mg total) by mouth every 6 (six) hours as needed for mild pain, fever or headache (or Fever >/= 101). 02/15/20   Shon Hale, MD  amLODipine (NORVASC) 10 MG tablet Take 1 tablet (10 mg total) by mouth daily. 08/17/22   Anabel Halon, MD  bisoprolol (ZEBETA) 5 MG tablet Take 1 tablet (5 mg total) by mouth daily. 08/24/22   Bensimhon, Bevelyn Buckles, MD  clobetasol cream (TEMOVATE) 0.05 % Apply 1 Application topically daily as needed (For rash).    [provider]  Fezolinetant (VEOZAH) 45 MG TABS Take 1 tablet (45 mg total) by mouth daily. 02/01/23   Anabel Halon, MD  Fluocinolone Acetonide Scalp 0.01 % OIL Apply to affected frontal scalp 4 (four) times a week. Not to face 07/27/22     furosemide (LASIX) 40 MG tablet Take 1 tablet (40 mg total) by mouth 3 (three) times a week. Mon, Wed, Fri Patient taking differently: Take 40 mg by mouth daily as needed for fluid. 08/25/22   Bensimhon, Bevelyn Buckles, MD  hydrOXYzine (ATARAX) 25 MG tablet Take 1-2 tablets (25-50 mg total) by mouth every 6 (six) hours as needed for itching. 01/11/23   Gilda Crease, MD  macitentan (OPSUMIT) 10 MG tablet Take 1 tablet (10 mg total) by mouth daily. 03/24/22   Bensimhon, Bevelyn Buckles, MD  Multiple Vitamin (MULTIVITAMIN WITH MINERALS) TABS tablet Take 1 tablet by mouth daily.    [provider]  mycophenolate (CELLCEPT) 500 MG tablet Take 3 tablets (1,500 mg total) by mouth every morning AND 3 tablets (1,500 mg total) every evening. 01/18/22   Mannam, Colbert Coyer, MD  Nintedanib (OFEV) 150 MG CAPS Take 1 capsule (150 mg total) by mouth 2 (two) times daily. 08/28/22   Quentin Angst, MD  NP THYROID 120 MG tablet Take 1 tablet (120 mg total) by mouth daily before breakfast. 08/18/22   Anabel Halon, MD   ondansetron (ZOFRAN) 4 MG tablet Take 1 tablet (4 mg total) by mouth every 8 (eight) hours as needed for nausea or vomiting. 10/09/22   Anabel Halon, MD  pantoprazole (PROTONIX) 40 MG tablet Take 1 tablet (40 mg total) by mouth 2 (two) times daily. 10/26/22   Anabel Halon, MD  potassium chloride 20 MEQ/15ML (10%) SOLN Take 15 mLs (20 mEq total) by mouth daily with furosemide Patient taking differently: Take 20 mEq by mouth daily as needed (For low potassium). 08/17/22   Anabel Halon, MD  spironolactone (ALDACTONE) 25 MG tablet Take 1 tablet (25 mg total) by mouth 2 (two) times daily. 12/21/22   Anabel Halon, MD  triamcinolone cream (KENALOG) 0.1 % Apply 1 Application topically daily as needed (For rash).    [provider]  PARoxetine (PAXIL) 10 MG tablet Take 1 tablet (10 mg  total) by mouth daily as directed 10/07/20 12/02/20      Physical Exam: Vitals:   02/03/23 1858 02/03/23 2300 02/04/23 0026 02/04/23 0200  BP: 129/83 (!) 129/93 (!) 128/90 (!) 142/90  Pulse: 81 86 88 91  Resp: (!) 22 18 16 18   Temp: 99 F (37.2 C)   98.1 F (36.7 C)  TempSrc: Oral   Oral  SpO2: 96% 100% 100% 96%  Height:       1.  General: Patient lying supine in bed,  no acute distress   2. Psychiatric: Somnolent and oriented x 3, mood and behavior normal for situation, pleasant and cooperative with exam   3. Neurologic: Speech and language are normal, face is symmetric, moves all 4 extremities voluntarily, at baseline without acute deficits on limited exam   4. HEENMT:  Head is atraumatic, normocephalic, pupils reactive to light, neck is supple, trachea is midline, mucous membranes are moist   5. Respiratory : Lungs are clear to auscultation bilaterally without wheezing, rhonchi, rales, no cyanosis, no increase in work of breathing or accessory muscle use   6. Cardiovascular : Heart rate normal, rhythm is regular, no murmurs, rubs or gallops, no peripheral edema, peripheral pulses  palpated   7. Gastrointestinal:  Abdomen is soft, nondistended, nontender to palpation at the time of my exam-status post opiate pain medication, bowel sounds active, no masses or organomegaly palpated   8. Skin:  Skin is warm, dry and intact without rashes, acute lesions, or ulcers on limited exam   9.Musculoskeletal:  No acute deformities or trauma, no asymmetry in tone, no peripheral edema, peripheral pulses palpated, no tenderness to palpation in the extremities  Data Reviewed: In the ED Temp 98.7-99, heart rate 81-92, respiratory rate 18-24, blood pressure 129/83-151/102, satting 96-100% No leukocytosis Hypokalemia on CHEM UA is indicative of UTI CT abdomen pelvis shows a partial SBO GEN surge was consulted and recommended NG tube, IV fluids, n.p.o. Patient refusing NG tube at this time Admission requested for small bowel obstruction  Assessment and Plan: * SBO (small bowel obstruction) (HCC) - CT abdomen shows partial small bowel obstruction - This is recurrent - Patient declining NG tube at this time - N.p.o. - IV fluids - GEN surge to see in the a.m.  UTI (urinary tract infection) - UA indicative UTI - Patient does endorse suprapubic tenderness - Start Rocephin - Send for culture  Hypokalemia - Potassium 3.3 - Secondary to GI losses - Replace and recheck  GERD (gastroesophageal reflux disease) - Continue IV Protonix until patient is able to tolerate p.o. again  HTN (hypertension) - Continue Norvasc, Zebeta, spironolactone when patient is able to tolerate p.o. again      Advance Care Planning:   Code Status: Full Code  Consults: General Surgery  Family Communication: Fianc at bedside  Severity of Illness: The appropriate patient status for this patient is INPATIENT. Inpatient status is judged to be reasonable and necessary in order to provide the required intensity of service to ensure the patient's safety. The patient's presenting symptoms, physical  exam findings, and initial radiographic and laboratory data in the context of their chronic comorbidities is felt to place them at high risk for further clinical deterioration. Furthermore, it is not anticipated that the patient will be medically stable for discharge from the hospital within 2 midnights of admission.   * I certify that at the point of admission it is my clinical judgment that the patient will require inpatient hospital care spanning beyond 2  midnights from the point of admission due to high intensity of service, high risk for further deterioration and high frequency of surveillance required.*  Author: Lilyan Gilford, DO 02/04/2023 4:28 AM  For on call review [email or web address].

## 2023-02-04 NOTE — Assessment & Plan Note (Signed)
-   Hold CellCept

## 2023-02-04 NOTE — Progress Notes (Signed)
Progress Note    Karen [name]   GMW:[id number]  DOB: May 17, 1970  DOA: 02/03/2023     0 PCP: Anabel Halon, MD  Initial CC: Abdominal pain  Hospital Course: [name] is a 52 yo female with PMH recurrent SBOs, scleroderma, ILD, pulmonary hypertension, hypothyroidism, HTN who presented with abdominal pain.  She also had associated nausea and vomiting. CT abdomen/pelvis showed concern for partial SBO in the mid ileum with transition zone noted. She declined NG tube on admission and was admitted for ongoing monitoring and surgical consultation.  Interval History:  No events overnight.  Still having some nausea this morning but controlled with Zofran.  No further vomiting.  Abdominal pain minimal.  Still declining NG tube placement.  Assessment and Plan: * SBO (small bowel obstruction) (HCC) - CT abdomen shows partial small bowel obstruction with transition zone noted in anterior abdomen -Patient still declining NG tube this morning but no further vomiting and nausea appears controlled - Continue fluids - Remain n.p.o. - Follow-up further general surgery recommendations  UTI (urinary tract infection) - UA indicative UTI - Patient does endorse suprapubic tenderness although could be related to underlying SBO as well - continue Rocephin - follow up culture  Hypokalemia - Replete as needed  ILD (interstitial lung disease) (HCC) - hold Ofev  GERD (gastroesophageal reflux disease) - Continue IV Protonix until patient is able to tolerate p.o. again  Scleroderma (HCC) - Hold CellCept  HTN (hypertension) - Home meds on hold for now - Continue as needed medications   Old records reviewed in assessment of this patient  Antimicrobials: Rocephin 02/04/2023 >> current  DVT prophylaxis:  SCDs Start: 02/04/23 0125   Code Status:   Code Status: Full Code  Mobility Assessment (Last 72 Hours)     Mobility Assessment     Row Name 02/04/23 0900 02/04/23 0100          Does patient have an order for bedrest or is patient medically unstable No - Continue assessment No - Continue assessment      What is the highest level of mobility based on the progressive mobility assessment? Level 5 (Walks with assist in room/hall) - Balance while stepping forward/back and can walk in room with assist - Complete Level 5 (Walks with assist in room/hall) - Balance while stepping forward/back and can walk in room with assist - Complete               Barriers to discharge: None Disposition Plan: Home Status is: Inpatient  Objective: Blood pressure (!) 128/98, pulse 87, temperature 97.9 F (36.6 C), temperature source Oral, resp. rate 18, height 5\' 6"  (1.676 m), SpO2 94%.  Examination:  Physical Exam Constitutional:      Appearance: Normal appearance.  HENT:     Head: Normocephalic and atraumatic.     Mouth/Throat:     Mouth: Mucous membranes are moist.  Eyes:     Extraocular Movements: Extraocular movements intact.  Cardiovascular:     Rate and Rhythm: Normal rate and regular rhythm.  Pulmonary:     Effort: Pulmonary effort is normal. No respiratory distress.     Breath sounds: Normal breath sounds. No wheezing.  Abdominal:     General: There is no distension.     Palpations: Abdomen is soft.     Tenderness: There is no abdominal tenderness.     Comments: Hypoactive bowel sounds in upper quadrants.  More prominent bowel sounds in lower quadrants  Musculoskeletal:  General: Normal range of motion.     Cervical back: Normal range of motion and neck supple.  Skin:    General: Skin is warm and dry.  Neurological:     General: No focal deficit present.     Mental Status: She is alert.  Psychiatric:        Mood and Affect: Mood normal.      Consultants:  General surgery  Procedures:    Data Reviewed: Results for orders placed or performed during the hospital encounter of 02/03/23 (from the past 24 hour(s))  Lipase, blood     Status: None    Collection Time: 02/03/23  5:49 PM  Result Value Ref Range   Lipase 22 11 - 51 U/L  Comprehensive metabolic panel     Status: Abnormal   Collection Time: 02/03/23  5:49 PM  Result Value Ref Range   Sodium 141 135 - 145 mmol/L   Potassium 3.3 (L) 3.5 - 5.1 mmol/L   Chloride 103 98 - 111 mmol/L   CO2 25 22 - 32 mmol/L   Glucose, Bld 129 (H) 70 - 99 mg/dL   BUN 14 6 - 20 mg/dL   Creatinine, Ser 4.09 0.44 - 1.00 mg/dL   Calcium 81.1 (H) 8.9 - 10.3 mg/dL   Total Protein 8.5 (H) 6.5 - 8.1 g/dL   Albumin 4.6 3.5 - 5.0 g/dL   AST 23 15 - 41 U/L   ALT 17 0 - 44 U/L   Alkaline Phosphatase 108 38 - 126 U/L   Total Bilirubin 0.6 0.3 - 1.2 mg/dL   GFR, Estimated >91 >47 mL/min   Anion gap 13 5 - 15  CBC     Status: Abnormal   Collection Time: 02/03/23  5:49 PM  Result Value Ref Range   WBC 11.6 (H) 4.0 - 10.5 K/uL   RBC 5.12 (H) 3.87 - 5.11 MIL/uL   Hemoglobin 13.8 12.0 - 15.0 g/dL   HCT 82.9 56.2 - 13.0 %   MCV 87.5 80.0 - 100.0 fL   MCH 27.0 26.0 - 34.0 pg   MCHC 30.8 30.0 - 36.0 g/dL   RDW 86.5 78.4 - 69.6 %   Platelets 256 150 - 400 K/uL   nRBC 0.0 0.0 - 0.2 %  Urinalysis, Routine w reflex microscopic -Urine, Clean Catch     Status: Abnormal   Collection Time: 02/03/23  9:19 PM  Result Value Ref Range   Color, Urine YELLOW YELLOW   APPearance HAZY (A) CLEAR   Specific Gravity, Urine 1.019 1.005 - 1.030   pH 7.0 5.0 - 8.0   Glucose, UA NEGATIVE NEGATIVE mg/dL   Hgb urine dipstick NEGATIVE NEGATIVE   Bilirubin Urine NEGATIVE NEGATIVE   Ketones, ur 5 (A) NEGATIVE mg/dL   Protein, ur >=295 (A) NEGATIVE mg/dL   Nitrite NEGATIVE NEGATIVE   Leukocytes,Ua SMALL (A) NEGATIVE   RBC / HPF 0-5 0 - 5 RBC/hpf   WBC, UA 21-50 0 - 5 WBC/hpf   Bacteria, UA MANY (A) NONE SEEN   Squamous Epithelial / HPF 0-5 0 - 5 /HPF   Mucus PRESENT   Pregnancy, urine     Status: None   Collection Time: 02/03/23  9:19 PM  Result Value Ref Range   Preg Test, Ur NEGATIVE NEGATIVE  Comprehensive  metabolic panel     Status: None   Collection Time: 02/04/23  7:21 AM  Result Value Ref Range   Sodium 141 135 - 145 mmol/L   Potassium 3.8  3.5 - 5.1 mmol/L   Chloride 105 98 - 111 mmol/L   CO2 26 22 - 32 mmol/L   Glucose, Bld 90 70 - 99 mg/dL   BUN 10 6 - 20 mg/dL   Creatinine, Ser 0.34 0.44 - 1.00 mg/dL   Calcium 9.4 8.9 - 74.2 mg/dL   Total Protein 7.3 6.5 - 8.1 g/dL   Albumin 3.9 3.5 - 5.0 g/dL   AST 19 15 - 41 U/L   ALT 14 0 - 44 U/L   Alkaline Phosphatase 90 38 - 126 U/L   Total Bilirubin 0.5 0.3 - 1.2 mg/dL   GFR, Estimated >59 >56 mL/min   Anion gap 10 5 - 15  Magnesium     Status: Abnormal   Collection Time: 02/04/23  7:21 AM  Result Value Ref Range   Magnesium 1.2 (L) 1.7 - 2.4 mg/dL  CBC with Differential/Platelet     Status: Abnormal   Collection Time: 02/04/23  7:21 AM  Result Value Ref Range   WBC 6.9 4.0 - 10.5 K/uL   RBC 4.45 3.87 - 5.11 MIL/uL   Hemoglobin 12.1 12.0 - 15.0 g/dL   HCT 38.7 56.4 - 33.2 %   MCV 91.0 80.0 - 100.0 fL   MCH 27.2 26.0 - 34.0 pg   MCHC 29.9 (L) 30.0 - 36.0 g/dL   RDW 95.1 88.4 - 16.6 %   Platelets 189 150 - 400 K/uL   nRBC 0.0 0.0 - 0.2 %   Neutrophils Relative % 57 %   Neutro Abs 3.9 1.7 - 7.7 K/uL   Lymphocytes Relative 33 %   Lymphs Abs 2.3 0.7 - 4.0 K/uL   Monocytes Relative 8 %   Monocytes Absolute 0.6 0.1 - 1.0 K/uL   Eosinophils Relative 2 %   Eosinophils Absolute 0.1 0.0 - 0.5 K/uL   Basophils Relative 0 %   Basophils Absolute 0.0 0.0 - 0.1 K/uL   Immature Granulocytes 0 %   Abs Immature Granulocytes 0.02 0.00 - 0.07 K/uL  TSH     Status: None   Collection Time: 02/04/23  7:21 AM  Result Value Ref Range   TSH 0.986 0.350 - 4.500 uIU/mL    I have reviewed pertinent nursing notes, vitals, labs, and images as necessary. I have ordered labwork to follow up on as indicated.  I have reviewed the last notes from staff over past 24 hours. I have discussed patient's care plan and test results with nursing staff, CM/SW,  and other staff as appropriate.  Time spent: Greater than 50% of the 55 minute visit was spent in counseling/coordination of care for the patient as laid out in the A&P.   LOS: 0 days   Lewie Chamber, MD [facility] 02/04/2023, 10:50 AM

## 2023-02-04 NOTE — Progress Notes (Signed)
NG tube inserted w/o difficulty. Placement verified by xray. Patient hooked to LIS per order.

## 2023-02-04 NOTE — Assessment & Plan Note (Addendum)
-   Continue IV Protonix until patient is able to tolerate p.o. again

## 2023-02-04 NOTE — Consult Note (Signed)
[facility] Consult  Reason for Consult: small bowel obstruction Referring Physician: Cheron Schaumann, PA-C  Chief Complaint   Abdominal Pain     HPI: Karen [name] is a 52 y.o. female who presents with a 1 day history of worsening abdominal pain, nausea, and vomiting.  Her symptoms started after eating a bagel yesterday morning.  She states that the symptoms are very similar to the symptoms that have resulted with her being admitted to the hospital with bowel obstructions in the past.  Since being in the hospital, she still feels unwell.  Her abdominal pain is slightly improved and she does feel less nausea.  Her past medical history is significant for CHF, GERD, hypertension, hypothyroidism, interstitial lung disease, and scleroderma.  Her surgical history is significant for a cholecystectomy, uterine fibroid surgery, and abdominal hernia repair (though the patient was unsure of the type of hernia surgery).  She denies use of blood thinning medications.  In the ED, she was noted to have a mild leukocytosis of 11.6.  She underwent a CT of the abdomen and pelvis which demonstrated likely partial small bowel obstruction in the mid ileum related to a transition zone in the anterior abdomen likely related to adhesions.  She also underwent a KUB this morning which again demonstrated persistently dilated loops of small bowel consistent with partial small bowel obstruction.  She initially refused NG tube placement overnight.  This morning, she states that she has not had any bowel movements or flatus since yesterday morning.  Her abdomen does still feel bloated, but she no longer has nausea.  Her abdominal pain is also improved, since receiving pain medications.  Past Medical History:  Diagnosis Date   Acute respiratory failure with hypoxia due to flash pulmonary edema requiring intubation 02/13/2020   Calculus of gallbladder with acute cholecystitis without obstruction    CHF  (congestive heart failure) (HCC)    GERD (gastroesophageal reflux disease)    Hypertension    Hypothyroidism    ILD (interstitial lung disease) (HCC) 02/08/2018   Interstitial lung disease (HCC)    Multinodular thyroid    benign FNA 08/2017.   Perimenopause 02/27/2019   Pulmonary edema    Scleroderma (HCC)    Status post laparoscopic cholecystectomy 02/13/20 02/13/2020   Vitamin D deficiency disease 02/27/2019    Past Surgical History:  Procedure Laterality Date   ABDOMINAL HERNIA REPAIR     BUNIONECTOMY Bilateral 10/2018   CHOLECYSTECTOMY N/A 02/13/2020   Procedure: LAPAROSCOPIC CHOLECYSTECTOMY;  Surgeon: Franky Macho, MD;  Location: [facility];  Service: General;  Laterality: N/A;   COLONOSCOPY WITH PROPOFOL N/A 01/02/2019   Procedure: COLONOSCOPY WITH PROPOFOL;  Surgeon: Corbin Ade, MD;  Location: [facility] SU[facility];  Service: Endoscopy;  Laterality: N/A;  12:30pm   ESOPHAGOGASTRODUODENOSCOPY (EGD) WITH PROPOFOL N/A 01/28/2018   erosive reflux esophagitis, patulous EG Junction, no dilation, incomplete EGD due to retained food in stomach. GES thereafter with delayed gastric emptying.    RIGHT HEART CATH N/A 09/27/2017   Procedure: RIGHT HEART CATH;  Surgeon: Dolores Patty, MD;  Location: [facility] INVASIVE CV LAB;  Service: Cardiovascular;  Laterality: N/A;   RIGHT HEART CATH N/A 09/05/2019   Procedure: RIGHT HEART CATH;  Surgeon: Dolores Patty, MD;  Location: [facility] INVASIVE CV LAB;  Service: Cardiovascular;  Laterality: N/A;   RIGHT/LEFT HEART CATH AND CORONARY ANGIOGRAPHY N/A 08/18/2021   Procedure: RIGHT/LEFT HEART CATH AND CORONARY ANGIOGRAPHY;  Surgeon: Dolores Patty, MD;  Location: [facility] INVASIVE CV LAB;  Service: Cardiovascular;  Laterality: N/A;   UTERINE FIBROID SURGERY      Family History  Problem Relation Age of Onset   Hypertension Father    Hypertension Sister    Hypertension Brother    Diabetes Maternal Grandfather    Diabetes Maternal Uncle    Diabetes Mother     Colon cancer Neg Hx     Social History   Tobacco Use   Smoking status: Never   Smokeless tobacco: Never  Vaping Use   Vaping status: Former  Substance Use Topics   Alcohol use: No   Drug use: No    Medications: I have reviewed the patient's current medications. Prior to Admission:  Medications Prior to Admission  Medication Sig Dispense Refill Last Dose   acetaminophen (TYLENOL) 325 MG tablet Take 2 tablets (650 mg total) by mouth every 6 (six) hours as needed for mild pain, fever or headache (or Fever >/= 101). 12 tablet 0    amLODipine (NORVASC) 10 MG tablet Take 1 tablet (10 mg total) by mouth daily. 90 tablet 3    bisoprolol (ZEBETA) 5 MG tablet Take 1 tablet (5 mg total) by mouth daily. 90 tablet 3    clobetasol cream (TEMOVATE) 0.05 % Apply 1 Application topically daily as needed (For rash).      Fezolinetant (VEOZAH) 45 MG TABS Take 1 tablet (45 mg total) by mouth daily. 30 tablet 3    Fluocinolone Acetonide Scalp 0.01 % OIL Apply to affected frontal scalp 4 (four) times a week. Not to face 118.28 mL 7    furosemide (LASIX) 40 MG tablet Take 1 tablet (40 mg total) by mouth 3 (three) times a week. Mon, Wed, Fri (Patient taking differently: Take 40 mg by mouth daily as needed for fluid.) 30 tablet 6    hydrOXYzine (ATARAX) 25 MG tablet Take 1-2 tablets (25-50 mg total) by mouth every 6 (six) hours as needed for itching. 30 tablet 0    macitentan (OPSUMIT) 10 MG tablet Take 1 tablet (10 mg total) by mouth daily. 30 tablet 11    Multiple Vitamin (MULTIVITAMIN WITH MINERALS) TABS tablet Take 1 tablet by mouth daily.      mycophenolate (CELLCEPT) 500 MG tablet Take 3 tablets (1,500 mg total) by mouth every morning AND 3 tablets (1,500 mg total) every evening. 540 tablet 1    Nintedanib (OFEV) 150 MG CAPS Take 1 capsule (150 mg total) by mouth 2 (two) times daily. 60 capsule 5    NP THYROID 120 MG tablet Take 1 tablet (120 mg total) by mouth daily before breakfast. 90 tablet 0     ondansetron (ZOFRAN) 4 MG tablet Take 1 tablet (4 mg total) by mouth every 8 (eight) hours as needed for nausea or vomiting. 20 tablet 0    pantoprazole (PROTONIX) 40 MG tablet Take 1 tablet (40 mg total) by mouth 2 (two) times daily. 180 tablet 1    potassium chloride 20 MEQ/15ML (10%) SOLN Take 15 mLs (20 mEq total) by mouth daily with furosemide (Patient taking differently: Take 20 mEq by mouth daily as needed (Lasix use).) 450 mL 3    spironolactone (ALDACTONE) 25 MG tablet Take 1 tablet (25 mg total) by mouth 2 (two) times daily. 180 tablet 1    triamcinolone cream (KENALOG) 0.1 % Apply 1 Application topically daily as needed (For rash).      Scheduled:  pantoprazole (PROTONIX) IV  40 mg Intravenous Q12H   Continuous:  sodium chloride 100 mL/hr at  02/04/23 0205   cefTRIAXone (ROCEPHIN)  IV 1 g (02/04/23 0522)   ONG:[id number] **OR** acetaminophen, hydrALAZINE, labetalol, morphine injection, ondansetron **OR** ondansetron (ZOFRAN) IV  Allergies  Allergen Reactions   Lisinopril Hives, Swelling and Other (See Comments)   Tocilizumab Other (See Comments)    Swelling and shortness of breath sharpe pains in back and chest tightness.     ROS:  Pertinent items are noted in HPI.  Blood pressure (!) 128/98, pulse 87, temperature 97.9 F (36.6 C), temperature source Oral, resp. rate 18, height 5\' 6"  (1.676 m), SpO2 94%. Physical Exam Vitals reviewed.  Constitutional:      Appearance: She is well-developed.  HENT:     Head: Normocephalic and atraumatic.  Eyes:     Extraocular Movements: Extraocular movements intact.     Pupils: Pupils are equal, round, and reactive to light.  Cardiovascular:     Rate and Rhythm: Normal rate.  Pulmonary:     Effort: Pulmonary effort is normal.  Abdominal:     Comments: Abdomen soft, mild distention, no percussion tenderness, nontender to palpation; no rigidity, guarding, rebound tenderness  Skin:    General: Skin is warm and dry.   Neurological:     General: No focal deficit present.     Mental Status: She is alert and oriented to person, place, and time.  Psychiatric:        Mood and Affect: Mood normal.        Behavior: Behavior normal.     Results: Results for orders placed or performed during the hospital encounter of 02/03/23 (from the past 48 hour(s))  Lipase, blood     Status: None   Collection Time: 02/03/23  5:49 PM  Result Value Ref Range   Lipase 22 11 - 51 U/L    Comment: Performed at [facility], [address], [place], [place] [address]  Comprehensive metabolic panel     Status: Abnormal   Collection Time: 02/03/23  5:49 PM  Result Value Ref Range   Sodium 141 135 - 145 mmol/L   Potassium 3.3 (L) 3.5 - 5.1 mmol/L   Chloride 103 98 - 111 mmol/L   CO2 25 22 - 32 mmol/L   Glucose, Bld 129 (H) 70 - 99 mg/dL    Comment: Glucose reference range applies only to samples taken after fasting for at least 8 hours.   BUN 14 6 - 20 mg/dL   Creatinine, Ser 3.66 0.44 - 1.00 mg/dL   Calcium 44.0 (H) 8.9 - 10.3 mg/dL   Total Protein 8.5 (H) 6.5 - 8.1 g/dL   Albumin 4.6 3.5 - 5.0 g/dL   AST 23 15 - 41 U/L   ALT 17 0 - 44 U/L   Alkaline Phosphatase 108 38 - 126 U/L   Total Bilirubin 0.6 0.3 - 1.2 mg/dL   GFR, Estimated >34 >74 mL/min    Comment: (NOTE) Calculated using the CKD-EPI Creatinine Equation (2021)    Anion gap 13 5 - 15    Comment: Performed at [facility], [address], [place], [place] [address]  CBC     Status: Abnormal   Collection Time: 02/03/23  5:49 PM  Result Value Ref Range   WBC 11.6 (H) 4.0 - 10.5 K/uL   RBC 5.12 (H) 3.87 - 5.11 MIL/uL   Hemoglobin 13.8 12.0 - 15.0 g/dL   HCT 38.7 56.4 - 33.2 %   MCV 87.5 80.0 - 100.0 fL   MCH 27.0 26.0 - 34.0 pg  MCHC 30.8 30.0 - 36.0 g/dL   RDW 09.8 11.9 - 14.7 %   Platelets 256 150 - 400 K/uL   nRBC 0.0 0.0 - 0.2 %    Comment: Performed at [facility], [address] [place], [place] [address]  Urinalysis, Routine w reflex  microscopic -Urine, Clean Catch     Status: Abnormal   Collection Time: 02/03/23  9:19 PM  Result Value Ref Range   Color, Urine YELLOW YELLOW   APPearance HAZY (A) CLEAR   Specific Gravity, Urine 1.019 1.005 - 1.030   pH 7.0 5.0 - 8.0   Glucose, UA NEGATIVE NEGATIVE mg/dL   Hgb urine dipstick NEGATIVE NEGATIVE   Bilirubin Urine NEGATIVE NEGATIVE   Ketones, ur 5 (A) NEGATIVE mg/dL   Protein, ur >=213 (A) NEGATIVE mg/dL   Nitrite NEGATIVE NEGATIVE   Leukocytes,Ua SMALL (A) NEGATIVE   RBC / HPF 0-5 0 - 5 RBC/hpf   WBC, UA 21-50 0 - 5 WBC/hpf   Bacteria, UA MANY (A) NONE SEEN   Squamous Epithelial / HPF 0-5 0 - 5 /HPF   Mucus PRESENT     Comment: Performed at [facility], [address] [place], [place] [address]  Pregnancy, urine     Status: None   Collection Time: 02/03/23  9:19 PM  Result Value Ref Range   Preg Test, Ur NEGATIVE NEGATIVE    Comment:        THE SENSITIVITY OF THIS METHODOLOGY IS >25 mIU/mL. Performed at [facility], [address], [place], [place] [address]   Comprehensive metabolic panel     Status: None   Collection Time: 02/04/23  7:21 AM  Result Value Ref Range   Sodium 141 135 - 145 mmol/L   Potassium 3.8 3.5 - 5.1 mmol/L   Chloride 105 98 - 111 mmol/L   CO2 26 22 - 32 mmol/L   Glucose, Bld 90 70 - 99 mg/dL    Comment: Glucose reference range applies only to samples taken after fasting for at least 8 hours.   BUN 10 6 - 20 mg/dL   Creatinine, Ser 2.95 0.44 - 1.00 mg/dL   Calcium 9.4 8.9 - 28.4 mg/dL   Total Protein 7.3 6.5 - 8.1 g/dL   Albumin 3.9 3.5 - 5.0 g/dL   AST 19 15 - 41 U/L   ALT 14 0 - 44 U/L   Alkaline Phosphatase 90 38 - 126 U/L   Total Bilirubin 0.5 0.3 - 1.2 mg/dL   GFR, Estimated >13 >24 mL/min    Comment: (NOTE) Calculated using the CKD-EPI Creatinine Equation (2021)    Anion gap 10 5 - 15    Comment: Performed at [facility], [address] [place], [place] [address]  Magnesium     Status: Abnormal   Collection Time:  02/04/23  7:21 AM  Result Value Ref Range   Magnesium 1.2 (L) 1.7 - 2.4 mg/dL    Comment: Performed at [facility], [address] [place], [place] [address]  CBC with Differential/Platelet     Status: Abnormal   Collection Time: 02/04/23  7:21 AM  Result Value Ref Range   WBC 6.9 4.0 - 10.5 K/uL   RBC 4.45 3.87 - 5.11 MIL/uL   Hemoglobin 12.1 12.0 - 15.0 g/dL   HCT 64.4 03.4 - 74.2 %   MCV 91.0 80.0 - 100.0 fL   MCH 27.2 26.0 - 34.0 pg   MCHC 29.9 (L) 30.0 - 36.0 g/dL   RDW 59.5 63.8 -  15.5 %   Platelets 189 150 - 400 K/uL   nRBC 0.0 0.0 - 0.2 %   Neutrophils Relative % 57 %   Neutro Abs 3.9 1.7 - 7.7 K/uL   Lymphocytes Relative 33 %   Lymphs Abs 2.3 0.7 - 4.0 K/uL   Monocytes Relative 8 %   Monocytes Absolute 0.6 0.1 - 1.0 K/uL   Eosinophils Relative 2 %   Eosinophils Absolute 0.1 0.0 - 0.5 K/uL   Basophils Relative 0 %   Basophils Absolute 0.0 0.0 - 0.1 K/uL   Immature Granulocytes 0 %   Abs Immature Granulocytes 0.02 0.00 - 0.07 K/uL    Comment: Performed at [facility], [address] [place], [place] [address]  TSH     Status: None   Collection Time: 02/04/23  7:21 AM  Result Value Ref Range   TSH 0.986 0.350 - 4.500 uIU/mL    Comment: Performed by a 3rd Generation assay with a functional sensitivity of <=0.01 uIU/mL. Performed at [facility], [address], [place], [place] [address]     DG Abd 1 View  Result Date: 02/04/2023 CLINICAL DATA:  Small-bowel obstruction. EXAM: ABDOMEN - 1 VIEW COMPARISON:  CT 02/03/2023.  Radiographs 10/11/2022 and 10/10/2022. FINDINGS: 0553 hours. Two supine views of the abdomen are submitted. The upper abdomen is incompletely visualized. No enteric tube is seen. Multiple mildly dilated loops of small bowel in the central abdomen are similar to recent CT. There is some gas within the proximal and transverse colon. No supine evidence of free intraperitoneal air. There is contrast material within the renal collecting systems and bladder  from the recent CT. No acute osseous findings. IMPRESSION: Persistent small bowel distension consistent with partial small-bowel obstruction, similar to recent CT. Electronically Signed   By: Carey Bullocks M.D.   On: 02/04/2023 09:57   CT ABDOMEN PELVIS W CONTRAST  Result Date: 02/03/2023 CLINICAL DATA:  Abdominal pain and emesis EXAM: CT ABDOMEN AND PELVIS WITH CONTRAST TECHNIQUE: Multidetector CT imaging of the abdomen and pelvis was performed using the standard protocol following bolus administration of intravenous contrast. RADIATION DOSE REDUCTION: This exam was performed according to the departmental dose-optimization program which includes automated exposure control, adjustment of the mA and/or kV according to patient size and/or use of iterative reconstruction technique. CONTRAST:  OMNIPAQUE IOHEXOL 300 MG/ML  SOLN COMPARISON:  None Available. FINDINGS: Lower chest: Mild bronchiectatic changes are noted. No focal infiltrate is seen. Hepatobiliary: No focal liver abnormality is seen. Status post cholecystectomy. No biliary dilatation. Pancreas: Unremarkable. No pancreatic ductal dilatation or surrounding inflammatory changes. Spleen: Normal in size without focal abnormality. Adrenals/Urinary Tract: Adrenal glands are within normal limits. Kidneys demonstrate a normal enhancement pattern bilaterally. 3 mm nonobstructing left renal stone is seen stable in appearance from the prior exam. No obstructive changes are seen. The bladder is decompressed. Stomach/Bowel: Stomach is within normal limits. Proximal small bowel is unremarkable. Distal small bowel is dilated with fluid with fecalization of bowel contents. Transition zone is noted along the anterior aspect of the abdomen likely related to adhesions from prior surgery. Colon is predominately decompressed. Vascular/Lymphatic: Aortic atherosclerosis. No enlarged abdominal or pelvic lymph nodes. Reproductive: Uterus is bulky consistent with uterine  fibroid change. Dominant anterior fibroid is noted measuring up to 7.8 cm. No adnexal mass is seen. Other: No abdominal wall hernia or abnormality. No abdominopelvic ascites. Musculoskeletal: No acute or significant osseous findings. IMPRESSION: Changes consistent with partial small bowel obstruction in the mid ileum  related to transition zone in the anterior abdomen. This is likely related to adhesions. Uterine fibroid change. Nonobstructing left renal stone. Electronically Signed   By: Alcide Clever M.D.   On: 02/03/2023 23:59   DG Chest 2 View  Result Date: 02/03/2023 CLINICAL DATA:  Shortness of breath EXAM: CHEST - 2 VIEW COMPARISON:  10/10/2022 FINDINGS: Cardiac shadow is stable. Aortic calcifications are noted. Lungs are well aerated bilaterally. Mild left basilar atelectasis is seen. No sizable effusion is noted. No bony abnormality is noted. IMPRESSION: Mild left basilar atelectasis. Electronically Signed   By: Alcide Clever M.D.   On: 02/03/2023 19:39     Assessment & Plan:  Elisandra Deshmukh is a 52 y.o. female who was admitted with concern for partial small bowel obstruction on imaging.  Imaging and blood work evaluated by myself.  -I discussed the pathophysiology of small bowel obstructions with the patient.  I further explained the importance of [place] attempting NG tube placement to try to decompress the dilated loops of small bowel.  We discussed that if she does not undergo NG tube placement, there is a higher probability that her bowel obstruction will not resolve on its own and will require surgical intervention. -Patient is agreeable to attempt NG tube placement this morning.  Will plan to give some pain medications prior to attempt.  Discussed with nurse -NPO -IV fluids -PRN pain control and antiemetics -Dulcolax suppository ordered -Monitor for return of bowel function -Will plan for small bowel obstruction protocol tomorrow pending NG tube insertion  All questions were answered to  the satisfaction of the patient and family.  -- Theophilus Kinds, DO [facility] [address] [place], [place] [address] [phone] (office)

## 2023-02-04 NOTE — Hospital Course (Signed)
Karen [name] is a 52 yo female with PMH recurrent SBOs, scleroderma, ILD, pulmonary hypertension, hypothyroidism, HTN who presented with abdominal pain.  Karen [name] also had associated nausea and vomiting. CT abdomen/pelvis showed concern for partial SBO in the mid ileum with transition zone noted. Karen [name] declined NG tube on admission and was admitted for ongoing monitoring and surgical consultation.

## 2023-02-04 NOTE — Progress Notes (Signed)
   02/04/23 1413  TOC Brief Assessment  Insurance and Status Reviewed  Patient has primary care physician Yes  Home environment has been reviewed From home  Prior level of function: Independent  Prior/Current Home Services No current home services  Social Determinants of Health Reivew SDOH reviewed no interventions necessary  Transition of care needs no transition of care needs at this time     Transition of Care Department [facility]) has reviewed patient and no TOC needs have been identified at this time. We will continue to monitor patient advancement through interdisciplinary progression rounds. If new patient transition needs arise, please place a TOC consult.

## 2023-02-04 NOTE — Assessment & Plan Note (Addendum)
-   UA indicative UTI - Patient does endorse suprapubic tenderness although could be related to underlying SBO as well - continue Rocephin; plan to complete 3-day empiric course - follow up culture

## 2023-02-05 DIAGNOSIS — K56609 Unspecified intestinal obstruction, unspecified as to partial versus complete obstruction: Secondary | ICD-10-CM

## 2023-02-05 DIAGNOSIS — N3000 Acute cystitis without hematuria: Secondary | ICD-10-CM

## 2023-02-05 LAB — CBC WITH DIFFERENTIAL/PLATELET
Abs Immature Granulocytes: 0.02 10*3/uL (ref 0.00–0.07)
Basophils Absolute: 0 10*3/uL (ref 0.0–0.1)
Basophils Relative: 0 %
Eosinophils Absolute: 0.2 10*3/uL (ref 0.0–0.5)
Eosinophils Relative: 3 %
HCT: 35.1 % — ABNORMAL LOW (ref 36.0–46.0)
Hemoglobin: 10.5 g/dL — ABNORMAL LOW (ref 12.0–15.0)
Immature Granulocytes: 0 %
Lymphocytes Relative: 24 %
Lymphs Abs: 1.5 10*3/uL (ref 0.7–4.0)
MCH: 26.6 pg (ref 26.0–34.0)
MCHC: 29.9 g/dL — ABNORMAL LOW (ref 30.0–36.0)
MCV: 88.9 fL (ref 80.0–100.0)
Monocytes Absolute: 0.5 10*3/uL (ref 0.1–1.0)
Monocytes Relative: 8 %
Neutro Abs: 3.9 10*3/uL (ref 1.7–7.7)
Neutrophils Relative %: 65 %
Platelets: 197 10*3/uL (ref 150–400)
RBC: 3.95 MIL/uL (ref 3.87–5.11)
RDW: 13.3 % (ref 11.5–15.5)
WBC: 6.1 10*3/uL (ref 4.0–10.5)
nRBC: 0 % (ref 0.0–0.2)

## 2023-02-05 LAB — BASIC METABOLIC PANEL
Anion gap: 10 (ref 5–15)
BUN: 7 mg/dL (ref 6–20)
CO2: 23 mmol/L (ref 22–32)
Calcium: 8.7 mg/dL — ABNORMAL LOW (ref 8.9–10.3)
Chloride: 108 mmol/L (ref 98–111)
Creatinine, Ser: 0.58 mg/dL (ref 0.44–1.00)
GFR, Estimated: >60 mL/min (ref >60)
Glucose, Bld: 71 mg/dL (ref 70–99)
Potassium: 3.1 mmol/L — ABNORMAL LOW (ref 3.5–5.1)
Sodium: 141 mmol/L (ref 135–145)

## 2023-02-05 LAB — MAGNESIUM: Magnesium: 2.1 mg/dL (ref 1.7–2.4)

## 2023-02-05 MED ORDER — PHENOL 1.4 % MT LIQD
1.0000 | OROMUCOSAL | Status: DC | PRN
  Filled 2023-02-05: qty 177

## 2023-02-05 MED ORDER — DIATRIZOATE MEGLUMINE & SODIUM 66-10 % PO SOLN
90.0000 mL | Freq: Once | ORAL | Status: AC
  Administered 2023-02-05: 90 mL via NASOGASTRIC
  Filled 2023-02-05: qty 90

## 2023-02-05 MED ORDER — POTASSIUM CHLORIDE 10 MEQ/100ML IV SOLN
10.0000 meq | INTRAVENOUS | Status: AC
  Administered 2023-02-05 (×3): 10 meq via INTRAVENOUS
  Filled 2023-02-05: qty 100

## 2023-02-05 NOTE — Progress Notes (Signed)
[facility] Progress Note     Subjective: Patient seen and examined.  She overall does not feel well today, but when asked what specifically is bothering her, she is unable to state.  She was having a little bit of abdominal pain before, but states she does not have any pain now.  She denies nausea and vomiting.  She does confirm having a headache which got better with Tylenol last night.  Her NG tube is in place and has had 400 cc of bilious output since placement.  She denies any flatus or bowel movements.  Objective: Vital signs in last 24 hours: Temp:  [98.1 F (36.7 C)-98.3 F (36.8 C)] 98.1 F (36.7 C) (09/29 2031) Pulse Rate:  [81-89] 89 (09/30 0509) Resp:  [18] 18 (09/30 0509) BP: (129-150)/(91-94) 129/91 (09/30 0509) SpO2:  [96 %] 96 % (09/30 0509) Last BM Date : 02/04/23  Intake/Output from previous day: No intake/output data recorded. Intake/Output this shift: No intake/output data recorded.  General appearance: alert, cooperative, and no distress GI: Abdomen soft, nondistended, no percussion tenderness, nontender to palpation; no rigidity, guarding, rebound tenderness  Lab Results:  Recent Labs    02/04/23 0721 02/05/23 0504  WBC 6.9 6.1  HGB 12.1 10.5*  HCT 40.5 35.1*  PLT 189 197   BMET Recent Labs    02/04/23 0721 02/05/23 0504  NA 141 141  K 3.8 3.1*  CL 105 108  CO2 26 23  GLUCOSE 90 71  BUN 10 7  CREATININE 0.57 0.58  CALCIUM 9.4 8.7*   PT/INR No results for input(s): "LABPROT", "INR" in the last 72 hours.  Studies/Results: DG Abd 1 View  Result Date: 02/04/2023 CLINICAL DATA:  Evaluate NG tube placement. EXAM: ABDOMEN - 1 VIEW COMPARISON:  Earlier today FINDINGS: Interval placement of enteric tube. The tip is in the body of the stomach with side port 5.4 cm below the hemidiaphragms. Persistent mildly dilated loops of small bowel are identified within the left hemiabdomen, similar to the exam from earlier today. Contrast  material within the renal collecting systems and bladder from recent CT. IMPRESSION: Enteric tube tip is in the body of the stomach with side port 5.4 cm below the hemidiaphragms. Persistent dilated small bowel loops compatible with partial small bowel obstruction. Electronically Signed   By: Signa Kell M.D.   On: 02/04/2023 13:15   DG Abd 1 View  Result Date: 02/04/2023 CLINICAL DATA:  Small-bowel obstruction. EXAM: ABDOMEN - 1 VIEW COMPARISON:  CT 02/03/2023.  Radiographs 10/11/2022 and 10/10/2022. FINDINGS: 0553 hours. Two supine views of the abdomen are submitted. The upper abdomen is incompletely visualized. No enteric tube is seen. Multiple mildly dilated loops of small bowel in the central abdomen are similar to recent CT. There is some gas within the proximal and transverse colon. No supine evidence of free intraperitoneal air. There is contrast material within the renal collecting systems and bladder from the recent CT. No acute osseous findings. IMPRESSION: Persistent small bowel distension consistent with partial small-bowel obstruction, similar to recent CT. Electronically Signed   By: Carey Bullocks M.D.   On: 02/04/2023 09:57   CT ABDOMEN PELVIS W CONTRAST  Result Date: 02/03/2023 CLINICAL DATA:  Abdominal pain and emesis EXAM: CT ABDOMEN AND PELVIS WITH CONTRAST TECHNIQUE: Multidetector CT imaging of the abdomen and pelvis was performed using the standard protocol following bolus administration of intravenous contrast. RADIATION DOSE REDUCTION: This exam was performed according to the departmental dose-optimization program which includes automated exposure  control, adjustment of the mA and/or kV according to patient size and/or use of iterative reconstruction technique. CONTRAST:  OMNIPAQUE IOHEXOL 300 MG/ML  SOLN COMPARISON:  None Available. FINDINGS: Lower chest: Mild bronchiectatic changes are noted. No focal infiltrate is seen. Hepatobiliary: No focal liver abnormality is seen.  Status post cholecystectomy. No biliary dilatation. Pancreas: Unremarkable. No pancreatic ductal dilatation or surrounding inflammatory changes. Spleen: Normal in size without focal abnormality. Adrenals/Urinary Tract: Adrenal glands are within normal limits. Kidneys demonstrate a normal enhancement pattern bilaterally. 3 mm nonobstructing left renal stone is seen stable in appearance from the prior exam. No obstructive changes are seen. The bladder is decompressed. Stomach/Bowel: Stomach is within normal limits. Proximal small bowel is unremarkable. Distal small bowel is dilated with fluid with fecalization of bowel contents. Transition zone is noted along the anterior aspect of the abdomen likely related to adhesions from prior surgery. Colon is predominately decompressed. Vascular/Lymphatic: Aortic atherosclerosis. No enlarged abdominal or pelvic lymph nodes. Reproductive: Uterus is bulky consistent with uterine fibroid change. Dominant anterior fibroid is noted measuring up to 7.8 cm. No adnexal mass is seen. Other: No abdominal wall hernia or abnormality. No abdominopelvic ascites. Musculoskeletal: No acute or significant osseous findings. IMPRESSION: Changes consistent with partial small bowel obstruction in the mid ileum related to transition zone in the anterior abdomen. This is likely related to adhesions. Uterine fibroid change. Nonobstructing left renal stone. Electronically Signed   By: Alcide Clever M.D.   On: 02/03/2023 23:59   DG Chest 2 View  Result Date: 02/03/2023 CLINICAL DATA:  Shortness of breath EXAM: CHEST - 2 VIEW COMPARISON:  10/10/2022 FINDINGS: Cardiac shadow is stable. Aortic calcifications are noted. Lungs are well aerated bilaterally. Mild left basilar atelectasis is seen. No sizable effusion is noted. No bony abnormality is noted. IMPRESSION: Mild left basilar atelectasis. Electronically Signed   By: Alcide Clever M.D.   On: 02/03/2023 19:39    Anti-infectives: Anti-infectives  (From admission, onward)    Start     Dose/Rate Route Frequency Ordered Stop   02/04/23 0500  cefTRIAXone (ROCEPHIN) 1 g in sodium chloride 0.9 % 100 mL IVPB        1 g 200 mL/hr over 30 Minutes Intravenous Every 24 hours 02/04/23 0424         Assessment/Plan:  Patient is a 52 year old female who was admitted with concern for partial small bowel obstruction.  -Patient with history of prior bowel obstructions ever able to be managed conservatively.  Will attempt to manage conservatively this admission -Will plan for small bowel obstruction protocol today -Discussed with the patient that we will obtain a film at 8 hours to see if contrast has made it into the colon.  If it has not made it into the colon, she will need a repeat image at 24 hours to again evaluate if contrast is within her colon.  If she still has no contrast in her colon, she may require surgical intervention at that point -NG tube to LIS unless clamped for protocol -NPO -IV fluids -PRN pain control and antiemetics.  May clamp NG tube for 30 to 60 minutes after oral medication administration -Dulcolax suppository ordered.  Educated the patient on importance of getting suppository -Monitor for return of bowel function -Appreciate hospitalist recommendations   LOS: 1 day    Makailah Slavick A Jaedynn Bohlken 02/05/2023

## 2023-02-05 NOTE — Progress Notes (Signed)
Progress Note    Gwynne Muscarella   UJW:[id number]  DOB: 11-Jan-1971  DOA: 02/03/2023     1 PCP: Anabel Halon, MD  Initial CC: Abdominal pain  Hospital Course: Ms. Demsky is a 52 yo female with PMH recurrent SBOs, scleroderma, ILD, pulmonary hypertension, hypothyroidism, HTN who presented with abdominal pain.  She also had associated nausea and vomiting. CT abdomen/pelvis showed concern for partial SBO in the mid ileum with transition zone noted. She declined NG tube on admission and was admitted for ongoing monitoring and surgical consultation.  Interval History:  No events overnight.  Tolerating NG tube but still complains of some discomfort with the tube.  Some nausea but no vomiting.  Still denies any flatus or bowel movement.  Assessment and Plan: * SBO (small bowel obstruction) (HCC) - CT abdomen shows partial small bowel obstruction with transition zone noted in anterior abdomen -NG tube placed 02/04/2023 after further discussion with patient and surgery - Continue fluids - Remain n.p.o. - Continue monitoring NG tube output  UTI (urinary tract infection) - UA indicative UTI - Patient does endorse suprapubic tenderness although could be related to underlying SBO as well - continue Rocephin; plan to complete 3-day empiric course - follow up culture  Hypokalemia - Replete as needed  ILD (interstitial lung disease) (HCC) - hold Ofev  GERD (gastroesophageal reflux disease) - Continue IV Protonix until patient is able to tolerate p.o. again  Scleroderma (HCC) - Hold CellCept  HTN (hypertension) - Home meds on hold for now - Continue as needed medications   Old records reviewed in assessment of this patient  Antimicrobials: Rocephin 02/04/2023 >> current  DVT prophylaxis:  SCDs Start: 02/04/23 0125   Code Status:   Code Status: Full Code  Mobility Assessment (Last 72 Hours)     Mobility Assessment     Row Name 02/05/23 1100 02/04/23 2300 02/04/23 0900  02/04/23 0100     Does patient have an order for bedrest or is patient medically unstable No - Continue assessment No - Continue assessment No - Continue assessment No - Continue assessment    What is the highest level of mobility based on the progressive mobility assessment? Level 5 (Walks with assist in room/hall) - Balance while stepping forward/back and can walk in room with assist - Complete Level 5 (Walks with assist in room/hall) - Balance while stepping forward/back and can walk in room with assist - Complete Level 5 (Walks with assist in room/hall) - Balance while stepping forward/back and can walk in room with assist - Complete Level 5 (Walks with assist in room/hall) - Balance while stepping forward/back and can walk in room with assist - Complete             Barriers to discharge: None Disposition Plan: Home Status is: Inpatient  Objective: Blood pressure (!) 129/91, pulse 89, temperature 98.1 F (36.7 C), temperature source Oral, resp. rate 18, height 5\' 6"  (1.676 m), SpO2 96%.  Examination:  Physical Exam Constitutional:      Appearance: Normal appearance.  HENT:     Head: Normocephalic and atraumatic.     Mouth/Throat:     Mouth: Mucous membranes are moist.  Eyes:     Extraocular Movements: Extraocular movements intact.  Cardiovascular:     Rate and Rhythm: Normal rate and regular rhythm.  Pulmonary:     Effort: Pulmonary effort is normal. No respiratory distress.     Breath sounds: Normal breath sounds. No wheezing.  Abdominal:  General: There is no distension.     Palpations: Abdomen is soft.     Tenderness: There is no abdominal tenderness.     Comments: Now more hypoactive in lower quadrants  Musculoskeletal:        General: Normal range of motion.     Cervical back: Normal range of motion and neck supple.  Skin:    General: Skin is warm and dry.  Neurological:     General: No focal deficit present.     Mental Status: She is alert.  Psychiatric:         Mood and Affect: Mood normal.      Consultants:  General surgery  Procedures:    Data Reviewed: Results for orders placed or performed during the hospital encounter of 02/03/23 (from the past 24 hour(s))  Basic metabolic panel     Status: Abnormal   Collection Time: 02/05/23  5:04 AM  Result Value Ref Range   Sodium 141 135 - 145 mmol/L   Potassium 3.1 (L) 3.5 - 5.1 mmol/L   Chloride 108 98 - 111 mmol/L   CO2 23 22 - 32 mmol/L   Glucose, Bld 71 70 - 99 mg/dL   BUN 7 6 - 20 mg/dL   Creatinine, Ser 1.91 0.44 - 1.00 mg/dL   Calcium 8.7 (L) 8.9 - 10.3 mg/dL   GFR, Estimated >47 >82 mL/min   Anion gap 10 5 - 15  CBC with Differential/Platelet     Status: Abnormal   Collection Time: 02/05/23  5:04 AM  Result Value Ref Range   WBC 6.1 4.0 - 10.5 K/uL   RBC 3.95 3.87 - 5.11 MIL/uL   Hemoglobin 10.5 (L) 12.0 - 15.0 g/dL   HCT 95.6 (L) 21.3 - 08.6 %   MCV 88.9 80.0 - 100.0 fL   MCH 26.6 26.0 - 34.0 pg   MCHC 29.9 (L) 30.0 - 36.0 g/dL   RDW 57.8 46.9 - 62.9 %   Platelets 197 150 - 400 K/uL   nRBC 0.0 0.0 - 0.2 %   Neutrophils Relative % 65 %   Neutro Abs 3.9 1.7 - 7.7 K/uL   Lymphocytes Relative 24 %   Lymphs Abs 1.5 0.7 - 4.0 K/uL   Monocytes Relative 8 %   Monocytes Absolute 0.5 0.1 - 1.0 K/uL   Eosinophils Relative 3 %   Eosinophils Absolute 0.2 0.0 - 0.5 K/uL   Basophils Relative 0 %   Basophils Absolute 0.0 0.0 - 0.1 K/uL   Immature Granulocytes 0 %   Abs Immature Granulocytes 0.02 0.00 - 0.07 K/uL  Magnesium     Status: None   Collection Time: 02/05/23  5:04 AM  Result Value Ref Range   Magnesium 2.1 1.7 - 2.4 mg/dL    I have reviewed pertinent nursing notes, vitals, labs, and images as necessary. I have ordered labwork to follow up on as indicated.  I have reviewed the last notes from staff over past 24 hours. I have discussed patient's care plan and test results with nursing staff, CM/SW, and other staff as appropriate.  Time spent: Greater than 50% of  the 55 minute visit was spent in counseling/coordination of care for the patient as laid out in the A&P.   LOS: 1 day   Lewie Chamber, MD [facility] 02/05/2023, 12:55 PM

## 2023-02-06 ENCOUNTER — Inpatient Hospital Stay (HOSPITAL_COMMUNITY): Payer: Commercial Managed Care - PPO

## 2023-02-06 DIAGNOSIS — N3000 Acute cystitis without hematuria: Secondary | ICD-10-CM | POA: Diagnosis not present

## 2023-02-06 DIAGNOSIS — K56609 Unspecified intestinal obstruction, unspecified as to partial versus complete obstruction: Secondary | ICD-10-CM | POA: Diagnosis not present

## 2023-02-06 LAB — CBC WITH DIFFERENTIAL/PLATELET
Abs Immature Granulocytes: 0.03 10*3/uL (ref 0.00–0.07)
Basophils Absolute: 0 10*3/uL (ref 0.0–0.1)
Basophils Relative: 0 %
Eosinophils Absolute: 0.2 10*3/uL (ref 0.0–0.5)
Eosinophils Relative: 2 %
HCT: 39.6 % (ref 36.0–46.0)
Hemoglobin: 12.2 g/dL (ref 12.0–15.0)
Immature Granulocytes: 0 %
Lymphocytes Relative: 17 %
Lymphs Abs: 1.1 10*3/uL (ref 0.7–4.0)
MCH: 27.2 pg (ref 26.0–34.0)
MCHC: 30.8 g/dL (ref 30.0–36.0)
MCV: 88.4 fL (ref 80.0–100.0)
Monocytes Absolute: 0.3 10*3/uL (ref 0.1–1.0)
Monocytes Relative: 4 %
Neutro Abs: 5.1 10*3/uL (ref 1.7–7.7)
Neutrophils Relative %: 77 %
Platelets: 172 10*3/uL (ref 150–400)
RBC: 4.48 MIL/uL (ref 3.87–5.11)
RDW: 13.1 % (ref 11.5–15.5)
WBC: 6.8 10*3/uL (ref 4.0–10.5)
nRBC: 0 % (ref 0.0–0.2)

## 2023-02-06 LAB — BASIC METABOLIC PANEL
Anion gap: 13 (ref 5–15)
BUN: 7 mg/dL (ref 6–20)
CO2: 17 mmol/L — ABNORMAL LOW (ref 22–32)
Calcium: 9.3 mg/dL (ref 8.9–10.3)
Chloride: 106 mmol/L (ref 98–111)
Creatinine, Ser: 0.57 mg/dL (ref 0.44–1.00)
GFR, Estimated: >60 mL/min (ref >60)
Glucose, Bld: 78 mg/dL (ref 70–99)
Potassium: 3.5 mmol/L (ref 3.5–5.1)
Sodium: 136 mmol/L (ref 135–145)

## 2023-02-06 LAB — MAGNESIUM: Magnesium: 1.7 mg/dL (ref 1.7–2.4)

## 2023-02-06 MED ORDER — AMLODIPINE BESYLATE 5 MG PO TABS
10.0000 mg | ORAL_TABLET | Freq: Every day | ORAL | Status: DC
  Administered 2023-02-06 – 2023-02-07 (×2): 10 mg via ORAL
  Filled 2023-02-06 (×2): qty 2

## 2023-02-06 MED ORDER — POLYETHYLENE GLYCOL 3350 17 G PO PACK
17.0000 g | PACK | Freq: Every day | ORAL | Status: DC
  Administered 2023-02-06 – 2023-02-07 (×2): 17 g via ORAL
  Filled 2023-02-06 (×2): qty 1

## 2023-02-06 MED ORDER — SENNOSIDES-DOCUSATE SODIUM 8.6-50 MG PO TABS
1.0000 | ORAL_TABLET | Freq: Two times a day (BID) | ORAL | Status: DC
  Administered 2023-02-06 – 2023-02-07 (×3): 1 via ORAL
  Filled 2023-02-06 (×3): qty 1

## 2023-02-06 MED ORDER — BISOPROLOL FUMARATE 5 MG PO TABS
5.0000 mg | ORAL_TABLET | Freq: Every day | ORAL | Status: DC
  Administered 2023-02-06 – 2023-02-07 (×2): 5 mg via ORAL
  Filled 2023-02-06 (×2): qty 1

## 2023-02-06 MED ORDER — MAGNESIUM SULFATE 2 GM/50ML IV SOLN
2.0000 g | Freq: Once | INTRAVENOUS | Status: AC
  Administered 2023-02-06: 2 g via INTRAVENOUS
  Filled 2023-02-06: qty 50

## 2023-02-06 MED ORDER — POTASSIUM CHLORIDE CRYS ER 20 MEQ PO TBCR
40.0000 meq | EXTENDED_RELEASE_TABLET | Freq: Once | ORAL | Status: DC
  Filled 2023-02-06: qty 2

## 2023-02-06 NOTE — Progress Notes (Signed)
[facility] Progress Note     Subjective: Patient seen and examined.  She is resting comfortably in bed.  She feels better than she did yesterday.  She denies nausea and vomiting.  She is passing flatus but denies any bowel movements.  Denies abdominal pain.  Objective: Vital signs in last 24 hours: Temp:  [98.1 F (36.7 C)-98.9 F (37.2 C)] 98.9 F (37.2 C) (10/01 0340) Pulse Rate:  [85-94] 85 (10/01 0340) Resp:  [16-18] 16 (10/01 0340) BP: (146-153)/(92-97) 148/97 (10/01 0340) SpO2:  [96 %-100 %] 96 % (10/01 0340) Last BM Date : 02/04/23  Intake/Output from previous day: 09/30 0701 - 10/01 0700 In: 469.8 [IV Piggyback:469.8] Out: 1000  Intake/Output this shift: Total I/O In: 1695.6 [I.V.:1695; IV Piggyback:0.6] Out: -   General appearance: alert, cooperative, and no distress GI: Abdomen soft, nondistended, no percussion tenderness, nontender palpation; no rigidity, guarding, rebound tenderness  Lab Results:  Recent Labs    02/04/23 0721 02/05/23 0504  WBC 6.9 6.1  HGB 12.1 10.5*  HCT 40.5 35.1*  PLT 189 197   BMET Recent Labs    02/04/23 0721 02/05/23 0504  NA 141 141  K 3.8 3.1*  CL 105 108  CO2 26 23  GLUCOSE 90 71  BUN 10 7  CREATININE 0.57 0.58  CALCIUM 9.4 8.7*   PT/INR No results for input(s): "LABPROT", "INR" in the last 72 hours.  Studies/Results: DG Abd Portable 1V-Small Bowel Obstruction Protocol-initial, 8 hr delay  Result Date: 02/06/2023 CLINICAL DATA:  9.5 hour small-bowel follow-up film EXAM: PORTABLE ABDOMEN - 1 VIEW COMPARISON:  02/04/2023 FINDINGS: Gastric catheter is noted in the stomach. Scattered large and small bowel gas is noted. Previously administered contrast now lies predominately within the colon. These changes are consistent with partial small bowel obstruction. The degree of small-bowel dilatation has improved from previous CT. IMPRESSION: Administered contrast lies in the colon consistent with partial  small bowel obstruction. Electronically Signed   By: Alcide Clever M.D.   On: 02/06/2023 01:35   DG Abd 1 View  Result Date: 02/04/2023 CLINICAL DATA:  Evaluate NG tube placement. EXAM: ABDOMEN - 1 VIEW COMPARISON:  Earlier today FINDINGS: Interval placement of enteric tube. The tip is in the body of the stomach with side port 5.4 cm below the hemidiaphragms. Persistent mildly dilated loops of small bowel are identified within the left hemiabdomen, similar to the exam from earlier today. Contrast material within the renal collecting systems and bladder from recent CT. IMPRESSION: Enteric tube tip is in the body of the stomach with side port 5.4 cm below the hemidiaphragms. Persistent dilated small bowel loops compatible with partial small bowel obstruction. Electronically Signed   By: Signa Kell M.D.   On: 02/04/2023 13:15    Anti-infectives: Anti-infectives (From admission, onward)    Start     Dose/Rate Route Frequency Ordered Stop   02/04/23 0500  cefTRIAXone (ROCEPHIN) 1 g in sodium chloride 0.9 % 100 mL IVPB        1 g 200 mL/hr over 30 Minutes Intravenous Every 24 hours 02/04/23 0424 02/06/23 0513       Assessment/Plan:  Patient is a 52 year old female who was admitted for concern for partial small bowel obstruction.  -8-hour film on her small bowel obstruction protocol demonstrated contrast within the colon with improved dilation of the loops of small intestine -Remove NG tube today -Clear liquid diet ordered -Can advance to full liquids this afternoon if she tolerates the clear liquids -  IV fluids per primary team -PRN pain control and antiemetics -Dulcolax suppositories ordered, will also order bowel regimen -Monitor for bowel function -Appreciate hospitalist recommendations   LOS: 2 days    Karen [name] A Yomaris Palecek 02/06/2023

## 2023-02-06 NOTE — Progress Notes (Signed)
Progress Note    Karen [name]   HYQ:[id number]  DOB: 11/01/1970  DOA: 02/03/2023     2 PCP: Anabel Halon, MD  Initial CC: Abdominal pain  Hospital Course: Ms. Karen [name] is a 52 yo female with PMH recurrent SBOs, scleroderma, ILD, pulmonary hypertension, hypothyroidism, HTN who presented with abdominal pain.  She also had associated nausea and vomiting. CT abdomen/pelvis showed concern for partial SBO in the mid ileum with transition zone noted. She declined NG tube on admission and was admitted for ongoing monitoring and surgical consultation.  After further discussion with surgery, she underwent NG tube placement on 02/04/2023.  After decompression, she underwent removal of NG tube and diet initiation on 02/06/2023.  Interval History:  No events overnight.  NG tube removed this morning with surgery and clear liquids being started. Resuming some home meds slowly.  Assessment and Plan: * SBO (small bowel obstruction) (HCC) - CT abdomen shows partial small bowel obstruction with transition zone noted in anterior abdomen -NG tube placed 02/04/2023 after further discussion with patient and surgery - Continue fluids; reduce rate - NGT removed 10/1 per surgery and CLD started - monitor for tolerance and advancement of diet  UTI (urinary tract infection) - UA indicative UTI - Patient does endorse suprapubic tenderness although could be related to underlying SBO as well - continue Rocephin; plan to complete 3-day course - follow up culture = Kleb  Hypokalemia - Replete as needed  ILD (interstitial lung disease) (HCC) - hold Ofev  GERD (gastroesophageal reflux disease) - Continue IV Protonix until patient is able to tolerate p.o. again  Scleroderma (HCC) - Hold CellCept  HTN (hypertension) - Resume amlodipine and bisoprolol - Continue PRN meds   Old records reviewed in assessment of this patient  Antimicrobials: Rocephin 02/04/2023 >> 02/06/2023  DVT prophylaxis:  SCDs  Start: 02/04/23 0125   Code Status:   Code Status: Full Code  Mobility Assessment (Last 72 Hours)     Mobility Assessment     Row Name 02/06/23 0830 02/05/23 2100 02/05/23 1100 02/04/23 2300 02/04/23 0900   Does patient have an order for bedrest or is patient medically unstable No - Continue assessment No - Continue assessment No - Continue assessment No - Continue assessment No - Continue assessment   What is the highest level of mobility based on the progressive mobility assessment? Level 5 (Walks with assist in room/hall) - Balance while stepping forward/back and can walk in room with assist - Complete Level 5 (Walks with assist in room/hall) - Balance while stepping forward/back and can walk in room with assist - Complete Level 5 (Walks with assist in room/hall) - Balance while stepping forward/back and can walk in room with assist - Complete Level 5 (Walks with assist in room/hall) - Balance while stepping forward/back and can walk in room with assist - Complete Level 5 (Walks with assist in room/hall) - Balance while stepping forward/back and can walk in room with assist - Complete    Row Name 02/04/23 0100           Does patient have an order for bedrest or is patient medically unstable No - Continue assessment       What is the highest level of mobility based on the progressive mobility assessment? Level 5 (Walks with assist in room/hall) - Balance while stepping forward/back and can walk in room with assist - Complete                Barriers to discharge:  None Disposition Plan: Home Status is: Inpatient  Objective: Blood pressure (!) 148/97, pulse 85, temperature 98.9 F (37.2 C), resp. rate 16, height 5\' 6"  (1.676 m), SpO2 96%.  Examination:  Physical Exam Constitutional:      Appearance: Normal appearance.  HENT:     Head: Normocephalic and atraumatic.     Mouth/Throat:     Mouth: Mucous membranes are moist.  Eyes:     Extraocular Movements: Extraocular movements  intact.  Cardiovascular:     Rate and Rhythm: Normal rate and regular rhythm.  Pulmonary:     Effort: Pulmonary effort is normal. No respiratory distress.     Breath sounds: Normal breath sounds. No wheezing.  Abdominal:     General: There is no distension.     Palpations: Abdomen is soft.     Tenderness: There is no abdominal tenderness.     Comments: Improved sounds throughout  Musculoskeletal:        General: Normal range of motion.     Cervical back: Normal range of motion and neck supple.  Skin:    General: Skin is warm and dry.  Neurological:     General: No focal deficit present.     Mental Status: She is alert.  Psychiatric:        Mood and Affect: Mood normal.      Consultants:  General surgery  Procedures:    Data Reviewed: Results for orders placed or performed during the hospital encounter of 02/03/23 (from the past 24 hour(s))  Basic metabolic panel     Status: Abnormal   Collection Time: 02/06/23  9:33 AM  Result Value Ref Range   Sodium 136 135 - 145 mmol/L   Potassium 3.5 3.5 - 5.1 mmol/L   Chloride 106 98 - 111 mmol/L   CO2 17 (L) 22 - 32 mmol/L   Glucose, Bld 78 70 - 99 mg/dL   BUN 7 6 - 20 mg/dL   Creatinine, Ser 4.13 0.44 - 1.00 mg/dL   Calcium 9.3 8.9 - 24.4 mg/dL   GFR, Estimated >01 >02 mL/min   Anion gap 13 5 - 15  CBC with Differential/Platelet     Status: None   Collection Time: 02/06/23  9:33 AM  Result Value Ref Range   WBC 6.8 4.0 - 10.5 K/uL   RBC 4.48 3.87 - 5.11 MIL/uL   Hemoglobin 12.2 12.0 - 15.0 g/dL   HCT 72.5 36.6 - 44.0 %   MCV 88.4 80.0 - 100.0 fL   MCH 27.2 26.0 - 34.0 pg   MCHC 30.8 30.0 - 36.0 g/dL   RDW 34.7 42.5 - 95.6 %   Platelets 172 150 - 400 K/uL   nRBC 0.0 0.0 - 0.2 %   Neutrophils Relative % 77 %   Neutro Abs 5.1 1.7 - 7.7 K/uL   Lymphocytes Relative 17 %   Lymphs Abs 1.1 0.7 - 4.0 K/uL   Monocytes Relative 4 %   Monocytes Absolute 0.3 0.1 - 1.0 K/uL   Eosinophils Relative 2 %   Eosinophils Absolute  0.2 0.0 - 0.5 K/uL   Basophils Relative 0 %   Basophils Absolute 0.0 0.0 - 0.1 K/uL   Immature Granulocytes 0 %   Abs Immature Granulocytes 0.03 0.00 - 0.07 K/uL  Magnesium     Status: None   Collection Time: 02/06/23  9:33 AM  Result Value Ref Range   Magnesium 1.7 1.7 - 2.4 mg/dL    I have reviewed pertinent nursing notes,  vitals, labs, and images as necessary. I have ordered labwork to follow up on as indicated.  I have reviewed the last notes from staff over past 24 hours. I have discussed patient's care plan and test results with nursing staff, CM/SW, and other staff as appropriate.  Time spent: Greater than 50% of the 55 minute visit was spent in counseling/coordination of care for the patient as laid out in the A&P.   LOS: 2 days   Lewie Chamber, MD [facility] 02/06/2023, 1:22 PM

## 2023-02-06 NOTE — Plan of Care (Signed)
  Problem: Activity: Goal: Risk for activity intolerance will decrease Outcome: Progressing   Problem: Coping: Goal: Level of anxiety will decrease Outcome: Progressing   Problem: Pain Managment: Goal: General experience of comfort will improve Outcome: Progressing   

## 2023-02-06 NOTE — Plan of Care (Signed)
  Problem: Education: Goal: Knowledge of General Education information will improve Description: Including pain rating scale, medication(s)/side effects and non-pharmacologic comfort measures Outcome: Progressing   Problem: Health Behavior/Discharge Planning: Goal: Ability to manage health-related needs will improve Outcome: Progressing   Problem: Clinical Measurements: Goal: Diagnostic test results will improve Outcome: Progressing Goal: Respiratory complications will improve Outcome: Progressing Goal: Cardiovascular complication will be avoided Outcome: Progressing   Problem: Activity: Goal: Risk for activity intolerance will decrease Outcome: Progressing   Problem: Nutrition: Goal: Adequate nutrition will be maintained Outcome: Progressing   Problem: Coping: Goal: Level of anxiety will decrease Outcome: Progressing   Problem: Elimination: Goal: Will not experience complications related to urinary retention Outcome: Progressing

## 2023-02-06 NOTE — Plan of Care (Signed)

## 2023-02-06 NOTE — Progress Notes (Signed)
Initial Nutrition Assessment  DOCUMENTATION CODES:   Not applicable  INTERVENTION:   Diet advancement to full liquids this afternoon.  NUTRITION DIAGNOSIS:   Inadequate oral intake related to acute illness as evidenced by NPO status (just advanced to clear liquids).  GOAL:   Patient will meet greater than or equal to 90% of their needs  MONITOR:   PO intake  REASON FOR ASSESSMENT:   Malnutrition Screening Tool    ASSESSMENT:   52 yo female admitted with partial SBO. PMH includes recurrent SBOs, scleroderma, ILD, pulmonary HTN, hypothyroidism, HTN.  NG tube placed on 9/29, removed today. Started on clear liquids today. Patient says she is hungry and ready to try full liquids this afternoon. Discussed with MD.  She thinks she has gained a little weight recently. Weight trends reviewed and weight has been stable for the past 8 months.  Patient states that she does not tolerate milk, but does okay with other dairy foods, such as cheese, ice cream, and yogurt. She has tried Ensure supplements in the past, but doesn't like the aftertaste. Will hold off on PO supplements at this time.   Labs reviewed. Medications reviewed and include Protonix, Miralax, KCl, Senokot-S, mag sulfate. IVF: NS at 75 ml/h   NUTRITION - FOCUSED PHYSICAL EXAM:  Flowsheet Row Most Recent Value  Orbital Region No depletion  Upper Arm Region No depletion  Thoracic and Lumbar Region No depletion  Buccal Region No depletion  Temple Region No depletion  Clavicle Bone Region No depletion  Clavicle and Acromion Bone Region No depletion  Scapular Bone Region No depletion  Dorsal Hand No depletion  Patellar Region No depletion  Anterior Thigh Region No depletion  Posterior Calf Region No depletion  Edema (RD Assessment) None  Hair Unable to assess  Eyes Reviewed  Mouth Reviewed  Skin Reviewed  Nails Reviewed       Diet Order:   Diet Order             Diet full liquid Room service  appropriate? Yes; Fluid consistency: Thin  Diet effective now                   EDUCATION NEEDS:   No education needs have been identified at this time  Skin:  Skin Assessment: Reviewed RN Assessment  Last BM:  9/29  Height:   Ht Readings from Last 1 Encounters:  02/03/23 5\' 6"  (1.676 m)    Weight:   Wt Readings from Last 1 Encounters:  02/01/23 63.1 kg    Ideal Body Weight:  59.1 kg  BMI:  Body mass index is 22.47 kg/m.  Estimated Nutritional Needs:   Kcal:  1600-1800  Protein:  75-85 gm  Fluid:  1.6-1.8 L   Karen [name] RD, LDN, CNSC Please refer to Amion for contact information.

## 2023-02-07 DIAGNOSIS — K56609 Unspecified intestinal obstruction, unspecified as to partial versus complete obstruction: Secondary | ICD-10-CM | POA: Diagnosis not present

## 2023-02-07 LAB — CBC WITH DIFFERENTIAL/PLATELET
Abs Immature Granulocytes: 0.02 10*3/uL (ref 0.00–0.07)
Basophils Absolute: 0 10*3/uL (ref 0.0–0.1)
Basophils Relative: 0 %
Eosinophils Absolute: 0.2 10*3/uL (ref 0.0–0.5)
Eosinophils Relative: 4 %
HCT: 39.2 % (ref 36.0–46.0)
Hemoglobin: 12.1 g/dL (ref 12.0–15.0)
Immature Granulocytes: 0 %
Lymphocytes Relative: 25 %
Lymphs Abs: 1.5 10*3/uL (ref 0.7–4.0)
MCH: 26.7 pg (ref 26.0–34.0)
MCHC: 30.9 g/dL (ref 30.0–36.0)
MCV: 86.5 fL (ref 80.0–100.0)
Monocytes Absolute: 0.4 10*3/uL (ref 0.1–1.0)
Monocytes Relative: 7 %
Neutro Abs: 3.8 10*3/uL (ref 1.7–7.7)
Neutrophils Relative %: 64 %
Platelets: 221 10*3/uL (ref 150–400)
RBC: 4.53 MIL/uL (ref 3.87–5.11)
RDW: 13.2 % (ref 11.5–15.5)
WBC: 6 10*3/uL (ref 4.0–10.5)
nRBC: 0 % (ref 0.0–0.2)

## 2023-02-07 LAB — BASIC METABOLIC PANEL
Anion gap: 9 (ref 5–15)
BUN: <5 mg/dL — ABNORMAL LOW (ref 6–20)
CO2: 22 mmol/L (ref 22–32)
Calcium: 9.5 mg/dL (ref 8.9–10.3)
Chloride: 108 mmol/L (ref 98–111)
Creatinine, Ser: 0.51 mg/dL (ref 0.44–1.00)
GFR, Estimated: >60 mL/min (ref >60)
Glucose, Bld: 99 mg/dL (ref 70–99)
Potassium: 3.3 mmol/L — ABNORMAL LOW (ref 3.5–5.1)
Sodium: 139 mmol/L (ref 135–145)

## 2023-02-07 LAB — URINE CULTURE: Culture: >=100000 — AB

## 2023-02-07 LAB — MAGNESIUM: Magnesium: 1.9 mg/dL (ref 1.7–2.4)

## 2023-02-07 MED ORDER — POTASSIUM CHLORIDE 20 MEQ PO PACK
40.0000 meq | PACK | Freq: Once | ORAL | Status: AC
  Administered 2023-02-07: 40 meq via ORAL
  Filled 2023-02-07: qty 2

## 2023-02-07 NOTE — Progress Notes (Signed)
[facility] Progress Note     Subjective: Patient seen and examined.  She is resting comfortably in bed.  She is tolerating her full liquid diet without nausea and vomiting.  She is passing flatus and having liquid bowel movements.  She denies abdominal pain.  Objective: Vital signs in last 24 hours: Temp:  [97.6 F (36.4 C)-98.5 F (36.9 C)] 98.1 F (36.7 C) (10/02 0423) Pulse Rate:  [70-96] 70 (10/02 0423) Resp:  [18-20] 18 (10/02 0423) BP: (111-140)/(77-97) 117/77 (10/02 0423) SpO2:  [100 %] 100 % (10/02 0423) Last BM Date : 02/06/23  Intake/Output from previous day: 10/01 0701 - 10/02 0700 In: 3079.5 [P.O.:480; I.V.:2552.6; IV Piggyback:46.9] Out: -  Intake/Output this shift: Total I/O In: 854 [I.V.:854] Out: -   General appearance: alert, cooperative, and no distress GI: Abdomen soft, nondistended, no percussion tenderness, non-tender to palpation; no rigidity, guarding, or rebound tenderness  Lab Results:  Recent Labs    02/06/23 0933 02/07/23 0533  WBC 6.8 6.0  HGB 12.2 12.1  HCT 39.6 39.2  PLT 172 221   BMET Recent Labs    02/06/23 0933 02/07/23 0533  NA 136 139  K 3.5 3.3*  CL 106 108  CO2 17* 22  GLUCOSE 78 99  BUN 7 <5*  CREATININE 0.57 0.51  CALCIUM 9.3 9.5   PT/INR No results for input(s): "LABPROT", "INR" in the last 72 hours.  Studies/Results: DG Abd Portable 1V-Small Bowel Obstruction Protocol-initial, 8 hr delay  Result Date: 02/06/2023 CLINICAL DATA:  9.5 hour small-bowel follow-up film EXAM: PORTABLE ABDOMEN - 1 VIEW COMPARISON:  02/04/2023 FINDINGS: Gastric catheter is noted in the stomach. Scattered large and small bowel gas is noted. Previously administered contrast now lies predominately within the colon. These changes are consistent with partial small bowel obstruction. The degree of small-bowel dilatation has improved from previous CT. IMPRESSION: Administered contrast lies in the colon consistent with partial  small bowel obstruction. Electronically Signed   By: Alcide Clever M.D.   On: 02/06/2023 01:35    Anti-infectives: Anti-infectives (From admission, onward)    Start     Dose/Rate Route Frequency Ordered Stop   02/04/23 0500  cefTRIAXone (ROCEPHIN) 1 g in sodium chloride 0.9 % 100 mL IVPB        1 g 200 mL/hr over 30 Minutes Intravenous Every 24 hours 02/04/23 0424 02/06/23 0513       Assessment/Plan:  Patient is a 52 year old female who was admitted with a partial small bowel obstruction.  -Tolerated full liquids -Advanced to GI soft diet -PRN pain control and antiemetics -Patient with bowel function -Continue bowel regimen -If patient tolerates solid food without nausea and vomiting, she is stable for discharge from a general surgery standpoint   LOS: 3 days    Aldine Chakraborty A Arnie Maiolo 02/07/2023

## 2023-02-07 NOTE — Plan of Care (Signed)

## 2023-02-07 NOTE — Discharge Summary (Signed)
Physician Discharge Summary  Karen [name] YIR:[id number] DOB: 11-13-1970 DOA: 02/03/2023  PCP: Anabel Halon, MD  Admit date: 02/03/2023 Discharge date: 02/07/2023 Recommendations for Outpatient Follow-up:  Follow up with PCP in 1 weeks-call for appointment Please obtain BMP/CBC in one week  Discharge Dispo: home Discharge Condition: Stable Code Status:   Code Status: Full Code Diet recommendation:  Diet Order             DIET SOFT Fluid consistency: Thin  Diet effective now                    Brief/Interim Summary: Karen [name] is a 52 yo female with PMH recurrent SBOs, scleroderma, ILD, pulmonary hypertension, hypothyroidism, HTN who presented with abdominal pain.  She also had associated nausea and vomiting. CT abdomen/pelvis showed concern for partial SBO in the mid ileum with transition zone noted. She declined NG tube on admission and was admitted for ongoing monitoring and surgical consultation.  After further discussion with surgery, she underwent NG tube placement on 02/04/2023.  After decompression, she underwent removal of NG tube and diet initiation on 02/06/2023. Patient has been doing well tolerating diet.  Diet was advanced to soft diet which she tolerated this afternoon, I discussed with surgery and is okay for discharge home they will arrange for outpatient follow-up   Discharge Diagnoses:  Principal Problem:   SBO (small bowel obstruction) (HCC) Active Problems:   UTI (urinary tract infection)   HTN (hypertension)   Scleroderma (HCC)   GERD (gastroesophageal reflux disease)   ILD (interstitial lung disease) (HCC)   Hypokalemia  SBO: CT abdomen shows partial small bowel obstruction with transition zone noted in anterior abdomen.NG tube placed 02/04/2023 after further discussion with patient and surgery.NGT removed 10/1 per surgery and CLD started> tolerating diet advance to soft diet this morning with moist Per surgery okay for discharge home today  UTI UA  indicative UTI, symptomatic with suprapubic tenderness, see complete antibiotics.follow up culture = Kleb  Hypokalemia Repleted here. continue supplement at home  ILD  During hospitalization had Ofev  GERD  Continue home PPI  Scleroderma- On discharge resume CellCept  HTN:  CONT home amlodipine and bisoprolol  Consults: General Surgery Subjective: Alert oriented no nausea vomiting, eager to go home after tolerating diet this afternoon  Discharge Exam: Vitals:   02/07/23 0423 02/07/23 1309  BP: 117/77 132/77  Pulse: 70 66  Resp: 18 17  Temp: 98.1 F (36.7 C) 98.8 F (37.1 C)  SpO2: 100%    General: Pt is alert, awake, not in acute distress Cardiovascular: RRR, S1/S2 +, no rubs, no gallops Respiratory: CTA bilaterally, no wheezing, no rhonchi Abdominal: Soft, NT, ND, bowel sounds + Extremities: no edema, no cyanosis  Discharge Instructions  Discharge Instructions     Discharge instructions   Complete by: As directed    Please call call MD or return to ER for similar or worsening recurring problem that brought you to hospital or if any fever,nausea/vomiting,abdominal pain, uncontrolled pain, chest pain,  shortness of breath or any other alarming symptoms.  Please follow-up your doctor as instructed in a week time and call the office for appointment.  Please avoid alcohol, smoking, or any other illicit substance and maintain healthy habits including taking your regular medications as prescribed.  You were cared for by a hospitalist during your hospital stay. If you have any questions about your discharge medications or the care you received while you were in the hospital after you are  discharged, you can call the unit and ask to speak with the hospitalist on call if the hospitalist that took care of you is not available.  Once you are discharged, your primary care physician will handle any further medical issues. Please note that NO REFILLS for any discharge  medications will be authorized once you are discharged, as it is imperative that you return to your primary care physician (or establish a relationship with a primary care physician if you do not have one) for your aftercare needs so that they can reassess your need for medications and monitor your lab values   Increase activity slowly   Complete by: As directed       Allergies as of 02/07/2023       Reactions   Lisinopril Hives, Swelling, Other (See Comments)   Tocilizumab Other (See Comments)   Swelling and shortness of breath sharpe pains in back and chest tightness.        Medication List     STOP taking these medications    clobetasol cream 0.05 % Commonly known as: TEMOVATE   Fluocinolone Acetonide Scalp 0.01 % Oil   hydrOXYzine 25 MG tablet Commonly known as: ATARAX   ondansetron 4 MG tablet Commonly known as: Zofran   triamcinolone cream 0.1 % Commonly known as: KENALOG   Veozah 45 MG Tabs Generic drug: Fezolinetant       TAKE these medications    acetaminophen 325 MG tablet Commonly known as: TYLENOL Take 2 tablets (650 mg total) by mouth every 6 (six) hours as needed for mild pain, fever or headache (or Fever >/= 101).   amLODipine 10 MG tablet Commonly known as: NORVASC Take 1 tablet (10 mg total) by mouth daily.   bisoprolol 5 MG tablet Commonly known as: ZEBETA Take 1 tablet (5 mg total) by mouth daily.   furosemide 40 MG tablet Commonly known as: LASIX Take 1 tablet (40 mg total) by mouth 3 (three) times a week. Mon, Wed, Fri What changed:  when to take this reasons to take this   macitentan 10 MG tablet Commonly known as: OPSUMIT Take 1 tablet (10 mg total) by mouth daily.   multivitamin with minerals Tabs tablet Take 1 tablet by mouth daily.   mycophenolate 500 MG tablet Commonly known as: CELLCEPT Take 3 tablets (1,500 mg total) by mouth every morning AND 3 tablets (1,500 mg total) every evening.   NP Thyroid 120 MG  tablet Generic drug: thyroid Take 1 tablet (120 mg total) by mouth daily before breakfast.   Ofev 150 MG Caps Generic drug: Nintedanib Take 1 capsule (150 mg total) by mouth 2 (two) times daily.   pantoprazole 40 MG tablet Commonly known as: PROTONIX Take 1 tablet (40 mg total) by mouth 2 (two) times daily.   potassium chloride 20 MEQ/15ML (10%) Soln Take 15 mLs (20 mEq total) by mouth daily with furosemide What changed:  when to take this reasons to take this   spironolactone 25 MG tablet Commonly known as: ALDACTONE Take 1 tablet (25 mg total) by mouth 2 (two) times daily.        Follow-up Information     Anabel Halon, MD Follow up in 1 week(s).   Specialty: Internal Medicine Contact information: [address] [place] [address] [phone]                Allergies  Allergen Reactions   Lisinopril Hives, Swelling and Other (See Comments)   Tocilizumab Other (See  Comments)    Swelling and shortness of breath sharpe pains in back and chest tightness.    The results of significant diagnostics from this hospitalization (including imaging, microbiology, ancillary and laboratory) are listed below for reference.    Microbiology: Recent Results (from the past 240 hour(s))  Urine Culture (for pregnant, neutropenic or urologic patients or patients with an indwelling urinary catheter)     Status: Abnormal   Collection Time: 02/03/23  9:19 PM   Specimen: Urine, Clean Catch  Result Value Ref Range Status   Specimen Description   Final    URINE, CLEAN CATCH Performed at [facility], [address], [place], [place] [address]    Special Requests   Final    NONE Performed at [facility], [address] [place], [place] [address]    Culture >=100,000 COLONIES/mL KLEBSIELLA PNEUMONIAE (A)  Final   Report Status 02/07/2023 FINAL  Final   Organism ID, Bacteria KLEBSIELLA PNEUMONIAE (A)  Final      Susceptibility   Klebsiella pneumoniae - MIC*     AMPICILLIN >=32 RESISTANT Resistant     CEFAZOLIN <=4 SENSITIVE Sensitive     CEFEPIME <=0.12 SENSITIVE Sensitive     CEFTRIAXONE <=0.25 SENSITIVE Sensitive     CIPROFLOXACIN <=0.25 SENSITIVE Sensitive     GENTAMICIN <=1 SENSITIVE Sensitive     IMIPENEM <=0.25 SENSITIVE Sensitive     NITROFURANTOIN 64 INTERMEDIATE Intermediate     TRIMETH/SULFA <=20 SENSITIVE Sensitive     AMPICILLIN/SULBACTAM 4 SENSITIVE Sensitive     PIP/TAZO <=4 SENSITIVE Sensitive     * >=100,000 COLONIES/mL KLEBSIELLA PNEUMONIAE    Procedures/Studies: DG Abd Portable 1V-Small Bowel Obstruction Protocol-initial, 8 hr delay  Result Date: 02/06/2023 CLINICAL DATA:  9.5 hour small-bowel follow-up film EXAM: PORTABLE ABDOMEN - 1 VIEW COMPARISON:  02/04/2023 FINDINGS: Gastric catheter is noted in the stomach. Scattered large and small bowel gas is noted. Previously administered contrast now lies predominately within the colon. These changes are consistent with partial small bowel obstruction. The degree of small-bowel dilatation has improved from previous CT. IMPRESSION: Administered contrast lies in the colon consistent with partial small bowel obstruction. Electronically Signed   By: Alcide Clever M.D.   On: 02/06/2023 01:35   DG Abd 1 View  Result Date: 02/04/2023 CLINICAL DATA:  Evaluate NG tube placement. EXAM: ABDOMEN - 1 VIEW COMPARISON:  Earlier today FINDINGS: Interval placement of enteric tube. The tip is in the body of the stomach with side port 5.4 cm below the hemidiaphragms. Persistent mildly dilated loops of small bowel are identified within the left hemiabdomen, similar to the exam from earlier today. Contrast material within the renal collecting systems and bladder from recent CT. IMPRESSION: Enteric tube tip is in the body of the stomach with side port 5.4 cm below the hemidiaphragms. Persistent dilated small bowel loops compatible with partial small bowel obstruction. Electronically Signed   By: Signa Kell  M.D.   On: 02/04/2023 13:15   DG Abd 1 View  Result Date: 02/04/2023 CLINICAL DATA:  Small-bowel obstruction. EXAM: ABDOMEN - 1 VIEW COMPARISON:  CT 02/03/2023.  Radiographs 10/11/2022 and 10/10/2022. FINDINGS: 0553 hours. Two supine views of the abdomen are submitted. The upper abdomen is incompletely visualized. No enteric tube is seen. Multiple mildly dilated loops of small bowel in the central abdomen are similar to recent CT. There is some gas within the proximal and transverse colon. No supine evidence of free intraperitoneal air. There is contrast material within the renal collecting  systems and bladder from the recent CT. No acute osseous findings. IMPRESSION: Persistent small bowel distension consistent with partial small-bowel obstruction, similar to recent CT. Electronically Signed   By: Carey Bullocks M.D.   On: 02/04/2023 09:57   CT ABDOMEN PELVIS W CONTRAST  Result Date: 02/03/2023 CLINICAL DATA:  Abdominal pain and emesis EXAM: CT ABDOMEN AND PELVIS WITH CONTRAST TECHNIQUE: Multidetector CT imaging of the abdomen and pelvis was performed using the standard protocol following bolus administration of intravenous contrast. RADIATION DOSE REDUCTION: This exam was performed according to the departmental dose-optimization program which includes automated exposure control, adjustment of the mA and/or kV according to patient size and/or use of iterative reconstruction technique. CONTRAST:  OMNIPAQUE IOHEXOL 300 MG/ML  SOLN COMPARISON:  None Available. FINDINGS: Lower chest: Mild bronchiectatic changes are noted. No focal infiltrate is seen. Hepatobiliary: No focal liver abnormality is seen. Status post cholecystectomy. No biliary dilatation. Pancreas: Unremarkable. No pancreatic ductal dilatation or surrounding inflammatory changes. Spleen: Normal in size without focal abnormality. Adrenals/Urinary Tract: Adrenal glands are within normal limits. Kidneys demonstrate a normal enhancement pattern  bilaterally. 3 mm nonobstructing left renal stone is seen stable in appearance from the prior exam. No obstructive changes are seen. The bladder is decompressed. Stomach/Bowel: Stomach is within normal limits. Proximal small bowel is unremarkable. Distal small bowel is dilated with fluid with fecalization of bowel contents. Transition zone is noted along the anterior aspect of the abdomen likely related to adhesions from prior surgery. Colon is predominately decompressed. Vascular/Lymphatic: Aortic atherosclerosis. No enlarged abdominal or pelvic lymph nodes. Reproductive: Uterus is bulky consistent with uterine fibroid change. Dominant anterior fibroid is noted measuring up to 7.8 cm. No adnexal mass is seen. Other: No abdominal wall hernia or abnormality. No abdominopelvic ascites. Musculoskeletal: No acute or significant osseous findings. IMPRESSION: Changes consistent with partial small bowel obstruction in the mid ileum related to transition zone in the anterior abdomen. This is likely related to adhesions. Uterine fibroid change. Nonobstructing left renal stone. Electronically Signed   By: Alcide Clever M.D.   On: 02/03/2023 23:59   DG Chest 2 View  Result Date: 02/03/2023 CLINICAL DATA:  Shortness of breath EXAM: CHEST - 2 VIEW COMPARISON:  10/10/2022 FINDINGS: Cardiac shadow is stable. Aortic calcifications are noted. Lungs are well aerated bilaterally. Mild left basilar atelectasis is seen. No sizable effusion is noted. No bony abnormality is noted. IMPRESSION: Mild left basilar atelectasis. Electronically Signed   By: Alcide Clever M.D.   On: 02/03/2023 19:39    Labs: BNP (last 3 results) Recent Labs    06/06/22 1439  BNP 52.0   Basic Metabolic Panel: Recent Labs  Lab 02/03/23 1749 02/04/23 0721 02/05/23 0504 02/06/23 0933 02/07/23 0533  NA 141 141 141 136 139  K 3.3* 3.8 3.1* 3.5 3.3*  CL 103 105 108 106 108  CO2 25 26 23  17* 22  GLUCOSE 129* 90 71 78 99  BUN 14 10 7 7  <5*   CREATININE 0.60 0.57 0.58 0.57 0.51  CALCIUM 11.2* 9.4 8.7* 9.3 9.5  MG  --  1.2* 2.1 1.7 1.9   Liver Function Tests: Recent Labs  Lab 02/03/23 1749 02/04/23 0721  AST 23 19  ALT 17 14  ALKPHOS 108 90  BILITOT 0.6 0.5  PROT 8.5* 7.3  ALBUMIN 4.6 3.9   Recent Labs  Lab 02/03/23 1749  LIPASE 22   No results for input(s): "AMMONIA" in the last 168 hours. CBC: Recent Labs  Lab 02/03/23  1749 02/04/23 0721 02/05/23 0504 02/06/23 0933 02/07/23 0533  WBC 11.6* 6.9 6.1 6.8 6.0  NEUTROABS  --  3.9 3.9 5.1 3.8  HGB 13.8 12.1 10.5* 12.2 12.1  HCT 44.8 40.5 35.1* 39.6 39.2  MCV 87.5 91.0 88.9 88.4 86.5  PLT 256 189 197 172 221   No results for input(s): "VITAMINB12", "FOLATE", "FERRITIN", "TIBC", "IRON", "RETICCTPCT" in the last 72 hours. Urinalysis    Component Value Date/Time   COLORURINE YELLOW 02/03/2023 2119   APPEARANCEUR HAZY (A) 02/03/2023 2119   LABSPEC 1.019 02/03/2023 2119   PHURINE 7.0 02/03/2023 2119   GLUCOSEU NEGATIVE 02/03/2023 2119   HGBUR NEGATIVE 02/03/2023 2119   BILIRUBINUR NEGATIVE 02/03/2023 2119   KETONESUR 5 (A) 02/03/2023 2119   PROTEINUR >=300 (A) 02/03/2023 2119   NITRITE NEGATIVE 02/03/2023 2119   LEUKOCYTESUR SMALL (A) 02/03/2023 2119   Sepsis Labs Recent Labs  Lab 02/04/23 0721 02/05/23 0504 02/06/23 0933 02/07/23 0533  WBC 6.9 6.1 6.8 6.0   Microbiology Recent Results (from the past 240 hour(s))  Urine Culture (for pregnant, neutropenic or urologic patients or patients with an indwelling urinary catheter)     Status: Abnormal   Collection Time: 02/03/23  9:19 PM   Specimen: Urine, Clean Catch  Result Value Ref Range Status   Specimen Description   Final    URINE, CLEAN CATCH Performed at [facility], [address] [place], [place] [address]    Special Requests   Final    NONE Performed at [facility], [address], [place], [place] [address]    Culture >=100,000 COLONIES/mL KLEBSIELLA PNEUMONIAE (A)  Final   Report  Status 02/07/2023 FINAL  Final   Organism ID, Bacteria KLEBSIELLA PNEUMONIAE (A)  Final      Susceptibility   Klebsiella pneumoniae - MIC*    AMPICILLIN >=32 RESISTANT Resistant     CEFAZOLIN <=4 SENSITIVE Sensitive     CEFEPIME <=0.12 SENSITIVE Sensitive     CEFTRIAXONE <=0.25 SENSITIVE Sensitive     CIPROFLOXACIN <=0.25 SENSITIVE Sensitive     GENTAMICIN <=1 SENSITIVE Sensitive     IMIPENEM <=0.25 SENSITIVE Sensitive     NITROFURANTOIN 64 INTERMEDIATE Intermediate     TRIMETH/SULFA <=20 SENSITIVE Sensitive     AMPICILLIN/SULBACTAM 4 SENSITIVE Sensitive     PIP/TAZO <=4 SENSITIVE Sensitive     * >=100,000 COLONIES/mL KLEBSIELLA PNEUMONIAE   Time coordinating discharge: 25 minutes  SIGNED: Lanae Boast, MD  [facility] 02/07/2023, 1:39 PM  If 7PM-7AM, please contact night-coverage [email or web address]

## 2023-02-08 ENCOUNTER — Telehealth: Payer: Self-pay

## 2023-02-08 DIAGNOSIS — M5412 Radiculopathy, cervical region: Secondary | ICD-10-CM | POA: Diagnosis not present

## 2023-02-08 NOTE — Transitions of Care (Post Inpatient/ED Visit) (Signed)
02/08/2023  Name: Karen [name] MRN: [id number] DOB: 06-May-1971  Today's TOC FU Call Status: Today's TOC FU Call Status:: Successful TOC FU Call Completed TOC FU Call Complete Date: 02/08/23 Patient's Name and Date of Birth confirmed.  Transition Care Management Follow-up Telephone Call Date of Discharge: 02/07/23 Discharge [facility] Type of Discharge: Inpatient Admission Primary Inpatient Discharge Diagnosis:: abd pain How have you been since you were released from the hospital?: Better Any questions or concerns?: No  Items Reviewed: Did you receive and understand the discharge instructions provided?: Yes Medications obtained,verified, and reconciled?: Yes (Medications Reviewed) Any new allergies since your discharge?: No Dietary orders reviewed?: Yes Do you have support at home?: Yes People in Home: parent(s), significant other  Medications Reviewed Today: Medications Reviewed Today     Reviewed by Karena Addison, LPN (Licensed Practical Nurse) on 02/08/23 at 1403  Med List Status: <None>   Medication Order Taking? Sig Documenting Provider Last Dose Status Informant  acetaminophen (TYLENOL) 325 MG tablet [id number] No Take 2 tablets (650 mg total) by mouth every 6 (six) hours as needed for mild pain, fever or headache (or Fever >/= 101). Shon Hale, MD Past Month Active Self  amLODipine (NORVASC) 10 MG tablet [id number] No Take 1 tablet (10 mg total) by mouth daily. Anabel Halon, MD 02/02/2023 Active Self  bisoprolol (ZEBETA) 5 MG tablet [id number] No Take 1 tablet (5 mg total) by mouth daily. Bensimhon, Bevelyn Buckles, MD 02/02/2023 Active Self  furosemide (LASIX) 40 MG tablet [id number] No Take 1 tablet (40 mg total) by mouth 3 (three) times a week. Mon, Wed, Fri  Patient taking differently: Take 40 mg by mouth daily as needed for fluid.   Bensimhon, Bevelyn Buckles, MD Past Week Active Self  macitentan (OPSUMIT) 10 MG tablet [id number] No Take 1 tablet (10 mg  total) by mouth daily. Bensimhon, Bevelyn Buckles, MD 02/02/2023 Active Self  Multiple Vitamin (MULTIVITAMIN WITH MINERALS) TABS tablet [id number] No Take 1 tablet by mouth daily.  Patient not taking: Reported on 02/05/2023   [provider] Not Taking Active Self  mycophenolate (CELLCEPT) 500 MG tablet [id number] No Take 3 tablets (1,500 mg total) by mouth every morning AND 3 tablets (1,500 mg total) every evening. Chilton Greathouse, MD 02/02/2023 Active Self  Nintedanib (OFEV) 150 MG CAPS [id number] No Take 1 capsule (150 mg total) by mouth 2 (two) times daily. Quentin Angst, MD 02/02/2023 Active Self  NP THYROID 120 MG tablet [id number] No Take 1 tablet (120 mg total) by mouth daily before breakfast. Anabel Halon, MD 02/02/2023 Active Self  pantoprazole (PROTONIX) 40 MG tablet [id number] No Take 1 tablet (40 mg total) by mouth 2 (two) times daily. Anabel Halon, MD 02/02/2023 Active Self   Discontinued 12/02/20 1502 (External Source Cancellation)   potassium chloride 20 MEQ/15ML (10%) SOLN [id number] No Take 15 mLs (20 mEq total) by mouth daily with furosemide  Patient taking differently: Take 20 mEq by mouth daily as needed (Lasix use).   Anabel Halon, MD Past Week Active Self  spironolactone (ALDACTONE) 25 MG tablet [id number] No Take 1 tablet (25 mg total) by mouth 2 (two) times daily. Anabel Halon, MD 02/02/2023 Active Self            Home Care and Equipment/Supplies: Were Home Health Services Ordered?: NA Any new equipment or medical supplies ordered?: NA  Functional Questionnaire: Do you need assistance with bathing/showering or dressing?: No Do you need assistance with meal preparation?: No  Do you need assistance with eating?: No Do you have difficulty maintaining continence: No Do you have difficulty managing or taking your medications?: No  Follow up appointments reviewed: PCP Follow-up appointment confirmed?: No (no avail appt. sent message to staff to schedule) MD  Provider Line Number:[phone] Given: No Specialist Hospital Follow-up appointment confirmed?: Yes Date of Specialist follow-up appointment?: 02/15/23 Follow-Up Specialty Provider:: pulmo Do you need transportation to your follow-up appointment?: No Do you understand care options if your condition(s) worsen?: Yes-patient verbalized understanding    SIGNATURE Karena Addison, LPN [facility] Nurse Health Advisor Direct Dial [phone]

## 2023-02-14 ENCOUNTER — Ambulatory Visit: Payer: Commercial Managed Care - PPO | Admitting: Pulmonary Disease

## 2023-02-14 ENCOUNTER — Telehealth: Payer: Self-pay | Admitting: Pulmonary Disease

## 2023-02-14 NOTE — Telephone Encounter (Signed)
Please advise if you would like pt to old off until after ct

## 2023-02-14 NOTE — Telephone Encounter (Signed)
Patient would like to know if CT scan needs to be before 02/15/2023 appointment. Patient would like to keep appointment for 02/15/2023. CT scheduled 02/16/2023. Patient phone number is [phone].

## 2023-02-15 ENCOUNTER — Ambulatory Visit: Payer: Commercial Managed Care - PPO | Admitting: Pulmonary Disease

## 2023-02-16 ENCOUNTER — Other Ambulatory Visit: Payer: Commercial Managed Care - PPO

## 2023-02-19 ENCOUNTER — Other Ambulatory Visit (HOSPITAL_COMMUNITY): Payer: Self-pay

## 2023-02-22 ENCOUNTER — Ambulatory Visit: Payer: Commercial Managed Care - PPO | Admitting: Internal Medicine

## 2023-02-22 ENCOUNTER — Encounter: Payer: Self-pay | Admitting: Internal Medicine

## 2023-02-22 ENCOUNTER — Other Ambulatory Visit (HOSPITAL_COMMUNITY): Payer: Self-pay

## 2023-02-22 VITALS — BP 128/86 | HR 78 | Ht 64.0 in | Wt 140.4 lb

## 2023-02-22 DIAGNOSIS — M5412 Radiculopathy, cervical region: Secondary | ICD-10-CM | POA: Diagnosis not present

## 2023-02-22 DIAGNOSIS — K5904 Chronic idiopathic constipation: Secondary | ICD-10-CM | POA: Diagnosis not present

## 2023-02-22 DIAGNOSIS — K56609 Unspecified intestinal obstruction, unspecified as to partial versus complete obstruction: Secondary | ICD-10-CM | POA: Diagnosis not present

## 2023-02-22 DIAGNOSIS — Z09 Encounter for follow-up examination after completed treatment for conditions other than malignant neoplasm: Secondary | ICD-10-CM | POA: Diagnosis not present

## 2023-02-22 DIAGNOSIS — I10 Essential (primary) hypertension: Secondary | ICD-10-CM

## 2023-02-22 MED ORDER — LINACLOTIDE 72 MCG PO CAPS
72.0000 ug | ORAL_CAPSULE | Freq: Every day | ORAL | 2 refills | Status: DC
  Filled 2023-02-22: qty 30, 30d supply, fill #0

## 2023-02-22 NOTE — Assessment & Plan Note (Signed)
Has tried multiple OTC stool softeners and laxatives - including Colace, Dulcolax, Benefiber, Metamucil, Senna and MiraLAX Started Linzess Follow up with GI

## 2023-02-22 NOTE — Progress Notes (Signed)
Established Patient Office Visit  Subjective:  Patient ID: Karen [name], female    DOB: August 06, 1970  Age: 52 y.o. MRN: [id number]  CC:  Chief Complaint  Patient presents with   Hospitalization Follow-up    Hospital Follow up     HPI Karen [name] is a 52 y.o. female with past medical history of HTN, scleroderma, ILD, pulmonary hypertension, dysphagia, hypothyroidism, chronic constipation and hot flashes who presents for follow-up after recent hospitalization from 01/14/23-02/07/23.  She presented with abdominal pain.  She also had associated nausea and vomiting. CT abdomen/pelvis showed concern for partial SBO in the mid ileum with transition zone noted. She declined NG tube on admission and was admitted for ongoing monitoring and surgical consultation.  After further discussion with surgery, she underwent NG tube placement on 02/04/2023.  After decompression, she underwent removal of NG tube and diet initiation on 02/06/2023.  Patient has been doing well tolerating soft diet.  She has regular BM.  She takes MiraLAX as needed for constipation.  She has history of chronic constipation, has tried Colace, MiraLAX, senna and lactulose in the past.  Denies any melena or hematochezia.  She has 3 episodes of SBO requiring hospitalization in this year.  She was evaluated by general surgery in the hospital and in outpatient setting as well.  She prefers to avoid surgery for now.  She has outpatient GI appointment in the next month.   Past Medical History:  Diagnosis Date   Acute respiratory failure with hypoxia due to flash pulmonary edema requiring intubation 02/13/2020   Calculus of gallbladder with acute cholecystitis without obstruction    CHF (congestive heart failure) (HCC)    GERD (gastroesophageal reflux disease)    Hypertension    Hypothyroidism    ILD (interstitial lung disease) (HCC) 02/08/2018   Interstitial lung disease (HCC)    Multinodular thyroid    benign FNA 08/2017.    Perimenopause 02/27/2019   Pulmonary edema    Scleroderma (HCC)    Status post laparoscopic cholecystectomy 02/13/20 02/13/2020   Vitamin D deficiency disease 02/27/2019    Past Surgical History:  Procedure Laterality Date   ABDOMINAL HERNIA REPAIR     BUNIONECTOMY Bilateral 10/2018   CHOLECYSTECTOMY N/A 02/13/2020   Procedure: LAPAROSCOPIC CHOLECYSTECTOMY;  Surgeon: Franky Macho, MD;  Location: [facility] ORS;  Service: General;  Laterality: N/A;   COLONOSCOPY WITH PROPOFOL N/A 01/02/2019   Procedure: COLONOSCOPY WITH PROPOFOL;  Surgeon: Corbin Ade, MD;  Location: [facility] ENDO SUITE;  Service: Endoscopy;  Laterality: N/A;  12:30pm   ESOPHAGOGASTRODUODENOSCOPY (EGD) WITH PROPOFOL N/A 01/28/2018   erosive reflux esophagitis, patulous EG Junction, no dilation, incomplete EGD due to retained food in stomach. GES thereafter with delayed gastric emptying.    RIGHT HEART CATH N/A 09/27/2017   Procedure: RIGHT HEART CATH;  Surgeon: Dolores Patty, MD;  Location: [facility] INVASIVE CV LAB;  Service: Cardiovascular;  Laterality: N/A;   RIGHT HEART CATH N/A 09/05/2019   Procedure: RIGHT HEART CATH;  Surgeon: Dolores Patty, MD;  Location: [facility] INVASIVE CV LAB;  Service: Cardiovascular;  Laterality: N/A;   RIGHT/LEFT HEART CATH AND CORONARY ANGIOGRAPHY N/A 08/18/2021   Procedure: RIGHT/LEFT HEART CATH AND CORONARY ANGIOGRAPHY;  Surgeon: Dolores Patty, MD;  Location: [facility] INVASIVE CV LAB;  Service: Cardiovascular;  Laterality: N/A;   UTERINE FIBROID SURGERY      Family History  Problem Relation Age of Onset   Hypertension Father    Hypertension Sister    Hypertension Brother  Diabetes Maternal Grandfather    Diabetes Maternal Uncle    Diabetes Mother    Colon cancer Neg Hx     Social History   Socioeconomic History   Marital status: Single    Spouse name: Not on file   Number of children: 0   Years of education: Not on file   Highest education level: Associate degree: occupational,  Scientist, product/process development, or vocational program  Occupational History   Occupation: CMA    Employer: [employer]  Tobacco Use   Smoking status: Never   Smokeless tobacco: Never  Vaping Use   Vaping status: Former  Substance and Sexual Activity   Alcohol use: No   Drug use: No   Sexual activity: Yes  Other Topics Concern   Not on file  Social History Narrative   Patient is right-handed. She lives alone in one level home, a few steps to enter.CMA [employer].   Social Determinants of Health   Financial Resource Strain: Not on file  Food Insecurity: No Food Insecurity (02/04/2023)   Hunger Vital Sign    Worried About Running Out of Food in the Last Year: Never true    Ran Out of Food in the Last Year: Never true  Transportation Needs: No Transportation Needs (02/04/2023)   PRAPARE - Administrator, Civil Service (Medical): No    Lack of Transportation (Non-Medical): No  Physical Activity: Not on file  Stress: Not on file  Social Connections: Not on file  Intimate Partner Violence: Not At Risk (02/04/2023)   Humiliation, Afraid, Rape, and Kick questionnaire    Fear of Current or Ex-Partner: No    Emotionally Abused: No    Physically Abused: No    Sexually Abused: No    Outpatient Medications Prior to Visit  Medication Sig Dispense Refill   acetaminophen (TYLENOL) 325 MG tablet Take 2 tablets (650 mg total) by mouth every 6 (six) hours as needed for mild pain, fever or headache (or Fever >/= 101). 12 tablet 0   bisoprolol (ZEBETA) 5 MG tablet Take 1 tablet (5 mg total) by mouth daily. 90 tablet 3   furosemide (LASIX) 40 MG tablet Take 1 tablet (40 mg total) by mouth 3 (three) times a week. Mon, Wed, Fri (Patient taking differently: Take 40 mg by mouth daily as needed for fluid.) 30 tablet 6   macitentan (OPSUMIT) 10 MG tablet Take 1 tablet (10 mg total) by mouth daily. 30 tablet 11   Multiple Vitamin (MULTIVITAMIN WITH MINERALS) TABS tablet Take 1 tablet by mouth  daily. (Patient not taking: Reported on 02/05/2023)     mycophenolate (CELLCEPT) 500 MG tablet Take 3 tablets (1,500 mg total) by mouth every morning AND 3 tablets (1,500 mg total) every evening. 540 tablet 1   Nintedanib (OFEV) 150 MG CAPS Take 1 capsule (150 mg total) by mouth 2 (two) times daily. 60 capsule 5   NP THYROID 120 MG tablet Take 1 tablet (120 mg total) by mouth daily before breakfast. 90 tablet 0   pantoprazole (PROTONIX) 40 MG tablet Take 1 tablet (40 mg total) by mouth 2 (two) times daily. 180 tablet 1   potassium chloride 20 MEQ/15ML (10%) SOLN Take 15 mLs (20 mEq total) by mouth daily with furosemide (Patient taking differently: Take 20 mEq by mouth daily as needed (Lasix use).) 450 mL 3   spironolactone (ALDACTONE) 25 MG tablet Take 1 tablet (25 mg total) by mouth 2 (two) times daily. 180 tablet 1  amLODipine (NORVASC) 10 MG tablet Take 1 tablet (10 mg total) by mouth daily. 90 tablet 3   No facility-administered medications prior to visit.    Allergies  Allergen Reactions   Lisinopril Hives, Swelling and Other (See Comments)   Tocilizumab Other (See Comments)    Swelling and shortness of breath sharpe pains in back and chest tightness.    ROS Review of Systems  Constitutional:  Positive for fatigue. Negative for chills and fever.  HENT:  Negative for congestion, sinus pressure, sinus pain and sore throat.   Eyes:  Negative for pain and discharge.  Respiratory:  Positive for cough and shortness of breath (intermittent).   Cardiovascular:  Negative for chest pain and palpitations.  Gastrointestinal:  Positive for constipation. Negative for abdominal pain, nausea and vomiting.  Endocrine: Negative for polydipsia and polyuria.  Genitourinary:  Negative for dysuria and hematuria.  Musculoskeletal:  Positive for arthralgias and neck pain. Negative for neck stiffness.  Skin:  Negative for rash.  Neurological:  Positive for numbness (LUE). Negative for dizziness and  weakness.  Psychiatric/Behavioral:  Negative for agitation and behavioral problems.       Objective:    Physical Exam Vitals reviewed.  Constitutional:      General: She is not in acute distress.    Appearance: She is not diaphoretic.  HENT:     Head: Normocephalic and atraumatic.     Nose: No congestion.     Mouth/Throat:     Mouth: Mucous membranes are moist.  Eyes:     General: No scleral icterus.    Extraocular Movements: Extraocular movements intact.  Cardiovascular:     Rate and Rhythm: Normal rate and regular rhythm.     Pulses: Normal pulses.     Heart sounds: Normal heart sounds. No murmur heard. Pulmonary:     Breath sounds: Normal breath sounds. No wheezing or rales.  Abdominal:     General: Bowel sounds are normal.     Palpations: Abdomen is soft.     Tenderness: There is no abdominal tenderness.  Musculoskeletal:     Cervical back: Neck supple. Tenderness present.     Right lower leg: Edema (Mild) present.     Left lower leg: Edema (Mild) present.  Skin:    General: Skin is warm.     Findings: No rash.  Neurological:     General: No focal deficit present.     Mental Status: She is alert and oriented to person, place, and time.     Cranial Nerves: No cranial nerve deficit.     Sensory: No sensory deficit.     Motor: No weakness.  Psychiatric:        Mood and Affect: Mood normal.        Behavior: Behavior normal.     BP 128/86 (BP Location: Left Arm)   Pulse 78   Ht 5\' 4"  (1.626 m)   Wt 140 lb 6.4 oz (63.7 kg)   BMI 24.10 kg/m  Wt Readings from Last 3 Encounters:  02/22/23 140 lb 6.4 oz (63.7 kg)  02/01/23 139 lb 3.2 oz (63.1 kg)  12/21/22 139 lb 6.4 oz (63.2 kg)    Lab Results  Component Value Date   TSH 0.986 02/04/2023   Lab Results  Component Value Date   WBC 6.0 02/07/2023   HGB 12.1 02/07/2023   HCT 39.2 02/07/2023   MCV 86.5 02/07/2023   PLT 221 02/07/2023   Lab Results  Component Value Date  NA 139 02/07/2023   K 3.3 (L)  02/07/2023   CO2 22 02/07/2023   GLUCOSE 99 02/07/2023   BUN <5 (L) 02/07/2023   CREATININE 0.51 02/07/2023   BILITOT 0.5 02/04/2023   ALKPHOS 90 02/04/2023   AST 19 02/04/2023   ALT 14 02/04/2023   PROT 7.3 02/04/2023   ALBUMIN 3.9 02/04/2023   CALCIUM 9.5 02/07/2023   ANIONGAP 9 02/07/2023   EGFR 107 12/28/2022   GFR 87.51 11/20/2019   Lab Results  Component Value Date   CHOL 196 04/13/2022   Lab Results  Component Value Date   HDL 54 04/13/2022   Lab Results  Component Value Date   LDLCALC 125 (H) 04/13/2022   Lab Results  Component Value Date   TRIG 93 04/13/2022   Lab Results  Component Value Date   CHOLHDL 3.6 04/13/2022   Lab Results  Component Value Date   HGBA1C 5.9 (H) 12/28/2022      Assessment & Plan:   Problem List Items Addressed This Visit       Cardiovascular and Mediastinum   HTN (hypertension)    BP Readings from Last 1 Encounters:  02/22/23 128/86   Well-controlled with Zebeta 5 mg QD and spironolactone 25 mg BID Since she had leg swelling even with half tablet of amlodipine, she stopped taking it - BP wnl today Gets Zebeta from CHF clinic Counseled for compliance with the medications Advised DASH diet and moderate exercise/walking, at least 150 mins/week        Digestive   Chronic idiopathic constipation    Has tried multiple OTC stool softeners and laxatives - including Colace, Dulcolax, Benefiber, Metamucil, Senna and MiraLAX Started Linzess Follow up with GI      Relevant Medications   linaclotide (LINZESS) 72 MCG capsule   SBO (small bowel obstruction) (HCC) - Primary    Had abdominal pain, nausea and vomiting X 2 days before admission Checked CT abdomen pelvis with contrast - showed partial SBO S/p NG tube decompression - advised to slowly advance to solid food Advised to keep food diary Likely has an effect of scleroderma and adhesions from previous surgeries Has BM now, but loose BM due to mostly liquid diet Needs  to maintain adequate hydration Can take MiraLAX as needed for constipation, added Linzess as chronic medication for constipation Zofran PRN for nausea Has been evaluated by General Surgery for recurrent SBO        Other   Hospital discharge follow-up    Hospital chart reviewed including discharge summary SBO resolved now Still has chronic constipation, likely due to scleroderma Medications reconciled and reviewed with the patient in detail       Meds ordered this encounter  Medications   linaclotide (LINZESS) 72 MCG capsule    Sig: Take 1 capsule (72 mcg total) by mouth daily before breakfast.    Dispense:  30 capsule    Refill:  2    Follow-up: Return if symptoms worsen or fail to improve.    Anabel Halon, MD

## 2023-02-22 NOTE — Patient Instructions (Signed)
Please start taking Linzess every other day.  Okay to advance to regular diet.  Please maintain at least 50 ounces of fluid intake in a day.

## 2023-02-22 NOTE — Assessment & Plan Note (Signed)
BP Readings from Last 1 Encounters:  02/22/23 128/86   Well-controlled with Zebeta 5 mg QD and spironolactone 25 mg BID Since she had leg swelling even with half tablet of amlodipine, she stopped taking it - BP wnl today Gets Zebeta from CHF clinic Counseled for compliance with the medications Advised DASH diet and moderate exercise/walking, at least 150 mins/week

## 2023-02-22 NOTE — Assessment & Plan Note (Signed)
Had abdominal pain, nausea and vomiting X 2 days before admission Checked CT abdomen pelvis with contrast - showed partial SBO S/p NG tube decompression - advised to slowly advance to solid food Advised to keep food diary Likely has an effect of scleroderma and adhesions from previous surgeries Has BM now, but loose BM due to mostly liquid diet Needs to maintain adequate hydration Can take MiraLAX as needed for constipation, added Linzess as chronic medication for constipation Zofran PRN for nausea Has been evaluated by General Surgery for recurrent SBO

## 2023-02-22 NOTE — Assessment & Plan Note (Signed)
Hospital chart reviewed including discharge summary SBO resolved now Still has chronic constipation, likely due to scleroderma Medications reconciled and reviewed with the patient in detail

## 2023-02-28 ENCOUNTER — Other Ambulatory Visit: Payer: Self-pay | Admitting: Pulmonary Disease

## 2023-02-28 ENCOUNTER — Other Ambulatory Visit: Payer: Self-pay | Admitting: Internal Medicine

## 2023-02-28 DIAGNOSIS — M349 Systemic sclerosis, unspecified: Secondary | ICD-10-CM

## 2023-02-28 DIAGNOSIS — E039 Hypothyroidism, unspecified: Secondary | ICD-10-CM

## 2023-02-28 DIAGNOSIS — J849 Interstitial pulmonary disease, unspecified: Secondary | ICD-10-CM

## 2023-03-01 ENCOUNTER — Other Ambulatory Visit (HOSPITAL_COMMUNITY): Payer: Self-pay

## 2023-03-01 ENCOUNTER — Other Ambulatory Visit: Payer: Self-pay

## 2023-03-01 DIAGNOSIS — M5412 Radiculopathy, cervical region: Secondary | ICD-10-CM | POA: Diagnosis not present

## 2023-03-01 MED ORDER — NP THYROID 120 MG PO TABS
120.0000 mg | ORAL_TABLET | Freq: Every day | ORAL | 0 refills | Status: DC
Start: 2023-03-01 — End: 2023-09-04
  Filled 2023-03-01: qty 90, 90d supply, fill #0

## 2023-03-08 ENCOUNTER — Other Ambulatory Visit (HOSPITAL_COMMUNITY): Payer: Self-pay

## 2023-03-08 MED ORDER — MYCOPHENOLATE MOFETIL 500 MG PO TABS
ORAL_TABLET | ORAL | 1 refills | Status: DC
  Filled 2023-03-08: qty 500, 83d supply, fill #0
  Filled 2023-03-09: qty 40, 7d supply, fill #0

## 2023-03-09 ENCOUNTER — Other Ambulatory Visit (HOSPITAL_COMMUNITY): Payer: Self-pay

## 2023-03-12 ENCOUNTER — Other Ambulatory Visit (HOSPITAL_COMMUNITY): Payer: Self-pay

## 2023-03-15 DIAGNOSIS — M5412 Radiculopathy, cervical region: Secondary | ICD-10-CM | POA: Diagnosis not present

## 2023-03-19 ENCOUNTER — Ambulatory Visit: Payer: Commercial Managed Care - PPO | Admitting: Pulmonary Disease

## 2023-03-22 ENCOUNTER — Ambulatory Visit
Admission: RE | Admit: 2023-03-22 | Discharge: 2023-03-22 | Disposition: A | Discharge status: Home / Self Care | Payer: Commercial Managed Care - PPO | Source: Ambulatory Visit | Attending: Pulmonary Disease | Admitting: Pulmonary Disease

## 2023-03-22 DIAGNOSIS — J849 Interstitial pulmonary disease, unspecified: Secondary | ICD-10-CM | POA: Diagnosis not present

## 2023-03-22 DIAGNOSIS — M5412 Radiculopathy, cervical region: Secondary | ICD-10-CM | POA: Diagnosis not present

## 2023-03-22 DIAGNOSIS — R599 Enlarged lymph nodes, unspecified: Secondary | ICD-10-CM | POA: Diagnosis not present

## 2023-03-22 DIAGNOSIS — I2510 Atherosclerotic heart disease of native coronary artery without angina pectoris: Secondary | ICD-10-CM | POA: Diagnosis not present

## 2023-03-22 DIAGNOSIS — I3139 Other pericardial effusion (noninflammatory): Secondary | ICD-10-CM | POA: Diagnosis not present

## 2023-03-23 ENCOUNTER — Other Ambulatory Visit: Payer: Self-pay

## 2023-03-28 DIAGNOSIS — H05012 Cellulitis of left orbit: Secondary | ICD-10-CM | POA: Diagnosis not present

## 2023-03-28 DIAGNOSIS — Z6823 Body mass index (BMI) 23.0-23.9, adult: Secondary | ICD-10-CM | POA: Diagnosis not present

## 2023-03-28 DIAGNOSIS — I10 Essential (primary) hypertension: Secondary | ICD-10-CM | POA: Diagnosis not present

## 2023-03-30 NOTE — Progress Notes (Deleted)
HPI :  Admitted October for SBO  52 yo female with PMH recurrent SBOs, scleroderma, ILD, pulmonary hypertension, hypothyroidism, HTN who presented with abdominal pain.  She also had associated nausea and vomiting. CT abdomen/pelvis showed concern for partial SBO in the mid ileum with transition zone noted. She declined NG tube on admission and was admitted for ongoing monitoring and surgical consultation.  After further discussion with surgery, she underwent NG tube placement on 02/04/2023.  After decompression, she underwent removal of NG tube and diet initiation on 02/06/2023. Patient has been doing well tolerating diet.  Diet was advanced to soft diet which she tolerated this afternoon, I discussed with surgery and is okay for discharge home they will arrange for outpatient follow-up   Her surgical history is significant for a cholecystectomy, uterine fibroid surgery, and abdominal hernia repair (though the patient was unsure of the type of hernia surgery).    CT abdomen / pelvis 01/14/23: IMPRESSION: Changes consistent with partial small bowel obstruction in the mid ileum related to transition zone in the anterior abdomen. This is likely related to adhesions.   Uterine fibroid change.   Nonobstructing left renal stone.   CT abdomen / pelvis 10/09/2022: IMPRESSION: Changes consistent with partial small bowel obstruction. Transition point appears to lie in the anterior abdomen just to the left of the midline. This is likely related to adhesions.   Remainder of the exam is stable with chronic findings of uterine fibroids and nonobstructing renal calculi.   Aortic Atherosclerosis (ICD10-I70.0).     CT abdomen pelvis 06/06/2022: IMPRESSION: 1. CT findings consistent with a small-bowel obstruction. The exact point of obstruction is not identified for certain but appears to be in the midline of the lower abdomen. 2. Stable changes of interstitial lung disease. No acute superimposed  pulmonary process. 3. Stable borderline mediastinal and hilar lymph nodes, likely reactive and due to the patient's underlying lung disease. 4. Bilateral renal calculi but no obstructing ureteral calculi or hydroureteronephrosis. 5. Enlarged fibroid uterus. 6. Status post cholecystectomy. No biliary dilatation.       Echo 07/21/21: EF 70-75%    Colonoscopy 01/02/19: Dr. Jena Gauss - normal, told to repeat in 10 years  EGD 01/28/18 Esophagitis was found. patulous esophagus. Distal esophageal erosions - skipped - coming up 5 cm from the GE junction. No Barrett's epithelium seen. Much retained food in the gastric cavity precluded complete evaluation and esophageal dilation (last intake of solid food 16 hours before this procedure). Pylorus patent. Findings: The duodenal bulb and second portion of the duodenum were normal.    Past Medical History:  Diagnosis Date   Acute respiratory failure with hypoxia due to flash pulmonary edema requiring intubation 02/13/2020   Calculus of gallbladder with acute cholecystitis without obstruction    CHF (congestive heart failure) (HCC)    Gastroparesis    GERD (gastroesophageal reflux disease)    Hypertension    Hypothyroidism    ILD (interstitial lung disease) (HCC) 02/08/2018   Interstitial lung disease (HCC)    Multinodular thyroid    benign FNA 08/2017.   Perimenopause 02/27/2019   Pulmonary edema    Scleroderma (HCC)    Status post laparoscopic cholecystectomy 02/13/20 02/13/2020   Vitamin D deficiency disease 02/27/2019     Past Surgical History:  Procedure Laterality Date   ABDOMINAL HERNIA REPAIR     BUNIONECTOMY Bilateral 10/2018   CHOLECYSTECTOMY N/A 02/13/2020   Procedure: LAPAROSCOPIC CHOLECYSTECTOMY;  Surgeon: Franky Macho, MD;  Location: [facility];  Service: General;  Laterality: N/A;   COLONOSCOPY WITH PROPOFOL N/A 01/02/2019   Procedure: COLONOSCOPY WITH PROPOFOL;  Surgeon: Corbin Ade, MD;  Location: [facility];  Service:  Endoscopy;  Laterality: N/A;  12:30pm   ESOPHAGOGASTRODUODENOSCOPY (EGD) WITH PROPOFOL N/A 01/28/2018   erosive reflux esophagitis, patulous EG Junction, no dilation, incomplete EGD due to retained food in stomach. GES thereafter with delayed gastric emptying.    RIGHT HEART CATH N/A 09/27/2017   Procedure: RIGHT HEART CATH;  Surgeon: Dolores Patty, MD;  Location: [facility] INVASIVE CV LAB;  Service: Cardiovascular;  Laterality: N/A;   RIGHT HEART CATH N/A 09/05/2019   Procedure: RIGHT HEART CATH;  Surgeon: Dolores Patty, MD;  Location: [facility] INVASIVE CV LAB;  Service: Cardiovascular;  Laterality: N/A;   RIGHT/LEFT HEART CATH AND CORONARY ANGIOGRAPHY N/A 08/18/2021   Procedure: RIGHT/LEFT HEART CATH AND CORONARY ANGIOGRAPHY;  Surgeon: Dolores Patty, MD;  Location: [facility] INVASIVE CV LAB;  Service: Cardiovascular;  Laterality: N/A;   UTERINE FIBROID SURGERY     Family History  Problem Relation Age of Onset   Hypertension Father    Hypertension Sister    Hypertension Brother    Diabetes Maternal Grandfather    Diabetes Maternal Uncle    Diabetes Mother    Colon cancer Neg Hx    Social History   Tobacco Use   Smoking status: Never   Smokeless tobacco: Never  Vaping Use   Vaping status: Former  Substance Use Topics   Alcohol use: No   Drug use: No   Current Outpatient Medications  Medication Sig Dispense Refill   acetaminophen (TYLENOL) 325 MG tablet Take 2 tablets (650 mg total) by mouth every 6 (six) hours as needed for mild pain, fever or headache (or Fever >/= 101). 12 tablet 0   bisoprolol (ZEBETA) 5 MG tablet Take 1 tablet (5 mg total) by mouth daily. 90 tablet 3   furosemide (LASIX) 40 MG tablet Take 1 tablet (40 mg total) by mouth 3 (three) times a week. Mon, Wed, Fri (Patient taking differently: Take 40 mg by mouth daily as needed for fluid.) 30 tablet 6   linaclotide (LINZESS) 72 MCG capsule Take 1 capsule (72 mcg total) by mouth daily before breakfast. 30 capsule 2    macitentan (OPSUMIT) 10 MG tablet Take 1 tablet (10 mg total) by mouth daily. 30 tablet 11   Multiple Vitamin (MULTIVITAMIN WITH MINERALS) TABS tablet Take 1 tablet by mouth daily. (Patient not taking: Reported on 02/05/2023)     mycophenolate (CELLCEPT) 500 MG tablet Take 3 tablets (1,500 mg total) by mouth every morning AND 3 tablets (1,500 mg total) every evening. 540 tablet 1   Nintedanib (OFEV) 150 MG CAPS Take 1 capsule (150 mg total) by mouth 2 (two) times daily. 60 capsule 5   NP THYROID 120 MG tablet Take 1 tablet (120 mg total) by mouth daily before breakfast. 90 tablet 0   pantoprazole (PROTONIX) 40 MG tablet Take 1 tablet (40 mg total) by mouth 2 (two) times daily. 180 tablet 1   potassium chloride 20 MEQ/15ML (10%) SOLN Take 15 mLs (20 mEq total) by mouth daily with furosemide (Patient taking differently: Take 20 mEq by mouth daily as needed (Lasix use).) 450 mL 3   spironolactone (ALDACTONE) 25 MG tablet Take 1 tablet (25 mg total) by mouth 2 (two) times daily. 180 tablet 1   No current facility-administered medications for this visit.   Allergies  Allergen Reactions   Lisinopril Hives, Swelling and  Other (See Comments)   Tocilizumab Other (See Comments)    Swelling and shortness of breath sharpe pains in back and chest tightness.     Review of Systems: All systems reviewed and negative except where noted in HPI.    No results found.  Physical Exam: There were no vitals taken for this visit. Constitutional: Pleasant,well-developed, ***female in no acute distress. HEENT: Normocephalic and atraumatic. Conjunctivae are normal. No scleral icterus. Neck supple.  Cardiovascular: Normal rate, regular rhythm.  Pulmonary/chest: Effort normal and breath sounds normal. No wheezing, rales or rhonchi. Abdominal: Soft, nondistended, nontender. Bowel sounds active throughout. There are no masses palpable. No hepatomegaly. Extremities: no edema Lymphadenopathy: No cervical adenopathy  noted. Neurological: Alert and oriented to person place and time. Skin: Skin is warm and dry. No rashes noted. Psychiatric: Normal mood and affect. Behavior is normal.   ASSESSMENT: 52 y.o. female here for assessment of the following  No diagnosis found.  PLAN:   Anabel Halon, MD

## 2023-04-02 ENCOUNTER — Telehealth: Payer: Self-pay | Admitting: Gastroenterology

## 2023-04-02 NOTE — Telephone Encounter (Signed)
Patient called and stated that she could not keep her appointment unfortunately and she is wanting to know as well if it was possible to speak or get a sooner appointment with Dr. Adela [name].  Please advise.

## 2023-04-03 ENCOUNTER — Ambulatory Visit: Payer: Commercial Managed Care - PPO | Admitting: Gastroenterology

## 2023-04-04 NOTE — Telephone Encounter (Signed)
Unfortunately, Dr Adela Lank has no availabilities for appointments at this time. March 2025 schedule should be coming out within the next couple of weeks.

## 2023-04-10 NOTE — Telephone Encounter (Signed)
PT called to see if new schedule is out for Dr. Adela Lank. Advised to call back in a few weeks.

## 2023-04-12 ENCOUNTER — Telehealth: Payer: Self-pay | Admitting: Internal Medicine

## 2023-04-12 ENCOUNTER — Other Ambulatory Visit: Payer: Self-pay

## 2023-04-12 ENCOUNTER — Telehealth: Payer: Self-pay | Admitting: Pharmacist

## 2023-04-12 NOTE — Progress Notes (Signed)
Specialty Pharmacy Ongoing Clinical Assessment Note  Karen [name] is a 52 y.o. female who is being followed by the specialty pharmacy service for RxSp Interstitial Lung Disease   Patient's specialty medication(s) reviewed today: Nintedanib Esylate   Missed doses in the last 4 weeks: 0   Patient/Caregiver did not have any additional questions or concerns.   Therapeutic benefit summary: Unable to assess   Adverse events/side effects summary: No adverse events/side effects   Patient's therapy is appropriate to: Continue    Goals Addressed             This Visit's Progress    Slow Disease Progression       Patient is  unable to assess as no recent provider visits since initiating treatment . Patient will maintain adherence and adhere to provider and/or lab appointments.          Follow up:  6 months  Otto Herb Specialty Pharmacist

## 2023-04-12 NOTE — Telephone Encounter (Signed)
FMLA forms  Copied Noted Sleeved (scan to S drive ) Call patient when ready

## 2023-04-12 NOTE — Progress Notes (Signed)
Specialty Pharmacy Refill Coordination Note  Karen [name] is a 52 y.o. female contacted today regarding refills of specialty medication(s) Nintedanib Esylate   Patient requested Delivery   Delivery date: 04/16/23   Verified address: [address]  [place] [address]   Medication will be filled on 04/13/23.

## 2023-04-12 NOTE — Telephone Encounter (Signed)
Received message from Raechel Chute, RpH at [employer].   Patient has not filled Ofev since August called today for refill. Patient reports no missed doses and claims she had extra on hand because "pharmacy kept sending it before she ran out". She is getting her Opsumit from [facility] and was very confused as to which pharmacy she was speaking with and about which medication.   Pharmacy has been advised to fill Ofev at this time, Patient has told me same in past and it is difficult to determine what her barrier to compliance is as she reports taking medication as prescribed and even reported extra when I had last talked to her.  Chesley Mires, PharmD, MPH, BCPS, CPP Clinical Pharmacist (Rheumatology and Pulmonology)

## 2023-04-13 ENCOUNTER — Other Ambulatory Visit: Payer: Self-pay

## 2023-04-13 ENCOUNTER — Other Ambulatory Visit (HOSPITAL_COMMUNITY): Payer: Self-pay

## 2023-04-13 NOTE — Telephone Encounter (Signed)
Completed and faxed.

## 2023-04-16 ENCOUNTER — Other Ambulatory Visit: Payer: Self-pay

## 2023-04-16 ENCOUNTER — Emergency Department (HOSPITAL_COMMUNITY)
Admission: EM | Admit: 2023-04-16 | Discharge: 2023-04-16 | Disposition: A | Discharge status: Home / Self Care | Payer: Commercial Managed Care - PPO | Attending: Emergency Medicine | Admitting: Emergency Medicine

## 2023-04-16 ENCOUNTER — Emergency Department (HOSPITAL_COMMUNITY): Payer: Commercial Managed Care - PPO

## 2023-04-16 DIAGNOSIS — I10 Essential (primary) hypertension: Secondary | ICD-10-CM | POA: Diagnosis not present

## 2023-04-16 DIAGNOSIS — R112 Nausea with vomiting, unspecified: Secondary | ICD-10-CM | POA: Diagnosis not present

## 2023-04-16 DIAGNOSIS — R110 Nausea: Secondary | ICD-10-CM | POA: Diagnosis not present

## 2023-04-16 DIAGNOSIS — Z1152 Encounter for screening for COVID-19: Secondary | ICD-10-CM | POA: Diagnosis not present

## 2023-04-16 DIAGNOSIS — R42 Dizziness and giddiness: Secondary | ICD-10-CM | POA: Diagnosis not present

## 2023-04-16 DIAGNOSIS — R0989 Other specified symptoms and signs involving the circulatory and respiratory systems: Secondary | ICD-10-CM | POA: Diagnosis not present

## 2023-04-16 DIAGNOSIS — R0602 Shortness of breath: Secondary | ICD-10-CM | POA: Insufficient documentation

## 2023-04-16 LAB — I-STAT VENOUS BLOOD GAS, ED
Acid-Base Excess: 3 mmol/L — ABNORMAL HIGH (ref 0.0–2.0)
Bicarbonate: 25.5 mmol/L (ref 20.0–28.0)
Calcium, Ion: 1.15 mmol/L (ref 1.15–1.40)
HCT: 45 % (ref 36.0–46.0)
Hemoglobin: 15.3 g/dL — ABNORMAL HIGH (ref 12.0–15.0)
O2 Saturation: 98 %
Potassium: 3.9 mmol/L (ref 3.5–5.1)
Sodium: 141 mmol/L (ref 135–145)
TCO2: 26 mmol/L (ref 22–32)
pCO2, Ven: 30.9 mm[Hg] — ABNORMAL LOW (ref 44–60)
pH, Ven: 7.524 — ABNORMAL HIGH (ref 7.25–7.43)
pO2, Ven: 89 mm[Hg] — ABNORMAL HIGH (ref 32–45)

## 2023-04-16 LAB — COMPREHENSIVE METABOLIC PANEL
ALT: 14 U/L (ref 0–44)
AST: 21 U/L (ref 15–41)
Albumin: 4.1 g/dL (ref 3.5–5.0)
Alkaline Phosphatase: 93 U/L (ref 38–126)
Anion gap: 9 (ref 5–15)
BUN: 8 mg/dL (ref 6–20)
CO2: 25 mmol/L (ref 22–32)
Calcium: 10.4 mg/dL — ABNORMAL HIGH (ref 8.9–10.3)
Chloride: 105 mmol/L (ref 98–111)
Creatinine, Ser: 0.82 mg/dL (ref 0.44–1.00)
GFR, Estimated: >60 mL/min (ref >60)
Glucose, Bld: 102 mg/dL — ABNORMAL HIGH (ref 70–99)
Potassium: 3.2 mmol/L — ABNORMAL LOW (ref 3.5–5.1)
Sodium: 139 mmol/L (ref 135–145)
Total Bilirubin: 1.1 mg/dL (ref <1.2)
Total Protein: 8.1 g/dL (ref 6.5–8.1)

## 2023-04-16 LAB — I-STAT CHEM 8, ED
BUN: 8 mg/dL (ref 6–20)
Calcium, Ion: 1.1 mmol/L — ABNORMAL LOW (ref 1.15–1.40)
Chloride: 108 mmol/L (ref 98–111)
Creatinine, Ser: 0.8 mg/dL (ref 0.44–1.00)
Glucose, Bld: 105 mg/dL — ABNORMAL HIGH (ref 70–99)
HCT: 46 % (ref 36.0–46.0)
Hemoglobin: 15.6 g/dL — ABNORMAL HIGH (ref 12.0–15.0)
Potassium: 3.3 mmol/L — ABNORMAL LOW (ref 3.5–5.1)
Sodium: 143 mmol/L (ref 135–145)
TCO2: 23 mmol/L (ref 22–32)

## 2023-04-16 LAB — RESP PANEL BY RT-PCR (RSV, FLU A&B, COVID)  RVPGX2
Influenza A by PCR: NEGATIVE
Influenza B by PCR: NEGATIVE
Resp Syncytial Virus by PCR: NEGATIVE
SARS Coronavirus 2 by RT PCR: NEGATIVE

## 2023-04-16 LAB — TROPONIN I (HIGH SENSITIVITY)
Troponin I (High Sensitivity): 18 ng/L — ABNORMAL HIGH (ref <18)
Troponin I (High Sensitivity): 14 ng/L (ref <18)

## 2023-04-16 LAB — CBC
HCT: 47.1 % — ABNORMAL HIGH (ref 36.0–46.0)
Hemoglobin: 14.4 g/dL (ref 12.0–15.0)
MCH: 26.7 pg (ref 26.0–34.0)
MCHC: 30.6 g/dL (ref 30.0–36.0)
MCV: 87.4 fL (ref 80.0–100.0)
Platelets: 241 10*3/uL (ref 150–400)
RBC: 5.39 MIL/uL — ABNORMAL HIGH (ref 3.87–5.11)
RDW: 14.4 % (ref 11.5–15.5)
WBC: 12.1 10*3/uL — ABNORMAL HIGH (ref 4.0–10.5)
nRBC: 0 % (ref 0.0–0.2)

## 2023-04-16 LAB — CBG MONITORING, ED: Glucose-Capillary: 72 mg/dL (ref 70–99)

## 2023-04-16 LAB — HCG, SERUM, QUALITATIVE: Preg, Serum: NEGATIVE

## 2023-04-16 MED ORDER — ACETAMINOPHEN 500 MG PO TABS
1000.0000 mg | ORAL_TABLET | Freq: Once | ORAL | Status: DC
  Filled 2023-04-16: qty 2

## 2023-04-16 MED ORDER — ONDANSETRON HCL 4 MG/2ML IJ SOLN
4.0000 mg | Freq: Once | INTRAMUSCULAR | Status: AC
  Administered 2023-04-16: 4 mg via INTRAVENOUS
  Filled 2023-04-16: qty 2

## 2023-04-16 NOTE — ED Notes (Signed)
Pt alert and sitting up speaking with visitor at bedside.

## 2023-04-16 NOTE — Discharge Instructions (Addendum)
You were seen here today for shortness of breath.  Your workup had a reassuring electrolytes, cell counts, kidney function, heart enzymes, x-ray of your chest, and EKG.  Please use all of your medications including your blood pressure medicines as prescribed.  Please follow-up with your PCP and pulmonologist.  Please return to the emergency department for chest pain, shortness of breath, severe vomiting or inability to keep down food, loss of consciousness, or any worsening symptom or concern.

## 2023-04-16 NOTE — ED Triage Notes (Signed)
Sudden onset shortness of breath while at work this AM. On lung transplant list. O2 below 70s at first. BP was initially 110/70. Alert and oriented x 4. Appears drowsy. EMS gave 1LPM [place] and O2 increased to 100%. Pt states 1 vomiting episode at home.

## 2023-04-16 NOTE — ED Provider Notes (Signed)
[place] EMERGENCY DEPARTMENT AT [facility] Provider Note   CSN: [id number] Arrival date & time: 04/16/23  [id number]     History  Chief Complaint  Patient presents with   Shortness of Breath    Karen [name] is a 52 y.o. female with a PMH of interstitial lung disease, Sjogren's syndrome, scleroderma, gastroparesis who presented to the ED for vomiting and shortness of breath.  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  Reports that she is outside, she had episode of vomiting.  Per EMS, coworkers at the office checked her oxygen level and concerned it was 70% but reported she has nail polish on and were not sure if it was picking up.  EMS reports they placed her on 1 L O2 and she was 100% with them.  Patient otherwise denies chest pain, abdominal pain, constipation, diarrhea.  Reports she has had similar episodes of shortness of breath in the past.   Shortness of Breath      Home Medications Prior to Admission medications   Medication Sig Start Date End Date Taking? Authorizing Provider  acetaminophen (TYLENOL) 325 MG tablet Take 2 tablets (650 mg total) by mouth every 6 (six) hours as needed for mild pain, fever or headache (or Fever >/= 101). Patient taking differently: Take 325-650 mg by mouth as needed for mild pain (pain score 1-3), fever or headache (or Fever >/= 101). 02/15/20  Yes Emokpae, Courage, MD  bisoprolol (ZEBETA) 5 MG tablet Take 1 tablet (5 mg total) by mouth daily. 08/24/22  Yes Bensimhon, Bevelyn Buckles, MD  furosemide (LASIX) 40 MG tablet Take 1 tablet (40 mg total) by mouth 3 (three) times a week. Mon, Wed, Fri Patient taking differently: Take 40-80 mg by mouth daily as needed for fluid. 08/25/22  Yes Bensimhon, Bevelyn Buckles, MD  macitentan (OPSUMIT) 10 MG tablet Take 1 tablet (10 mg total) by mouth daily. 03/24/22  Yes Bensimhon, Bevelyn Buckles, MD  mycophenolate (CELLCEPT) 500 MG  tablet Take 3 tablets (1,500 mg total) by mouth every morning AND 3 tablets (1,500 mg total) every evening. Patient taking differently: Take 500 mg by mouth twice daily.  03/08/23  Yes Mannam, Praveen, MD  Nintedanib (OFEV) 150 MG CAPS Take 1 capsule (150 mg total) by mouth 2 (two) times daily. 08/28/22  Yes Quentin Angst, MD  NP THYROID 120 MG tablet Take 1 tablet (120 mg total) by mouth daily before breakfast. 03/01/23  Yes Anabel Halon, MD  pantoprazole (PROTONIX) 40 MG tablet Take 1 tablet (40 mg total) by mouth 2 (two) times daily. Patient taking differently: Take 80 mg by mouth daily. 10/26/22  Yes Anabel Halon, MD  potassium chloride 20 MEQ/15ML (10%) SOLN Take 15 mLs (20 mEq total) by mouth daily with furosemide Patient taking differently: Take 20 mEq by mouth daily as needed (Lasix use). 08/17/22  Yes Anabel Halon, MD  spironolactone (ALDACTONE) 25 MG tablet Take 1 tablet (25 mg total) by mouth 2 (two) times daily. Patient taking differently: Take 50 mg by mouth daily. 12/21/22  Yes Anabel Halon, MD  linaclotide [facility]) 72 MCG capsule Take 1 capsule (72 mcg total) by mouth daily before breakfast. Patient not taking: Reported on 04/16/2023 02/22/23   Anabel Halon, MD  PARoxetine (PAXIL) 10 MG tablet Take 1 tablet (10 mg total) by mouth daily as directed 10/07/20 12/02/20  Allergies    Lisinopril and Tocilizumab    Review of Systems   Review of Systems  Respiratory:  Positive for shortness of breath.     Physical Exam Updated Vital Signs BP (!) 148/108   Pulse 80   Temp 98 F (36.7 C) (Oral)   Resp (!) 22   Ht 5\' 4"  (1.626 m)   Wt 63.7 kg   SpO2 98%   BMI 24.11 kg/m  Physical Exam Constitutional:      Appearance: She is well-developed.  HENT:     Head: Normocephalic and atraumatic.  Eyes:     Pupils: Pupils are equal, round, and reactive to light.  Cardiovascular:     Rate and Rhythm: Normal rate and regular rhythm.     Heart sounds: No murmur  heard.    No friction rub. No gallop.  Pulmonary:     Effort: Pulmonary effort is normal.     Breath sounds: Rales present. No wheezing or rhonchi.     Comments: Crackles at bilateral bases Abdominal:     Palpations: Abdomen is soft.     Tenderness: There is no abdominal tenderness. There is no guarding or rebound.  Musculoskeletal:     Cervical back: Normal range of motion and neck supple.     Right lower leg: No edema.     Left lower leg: No edema.  Skin:    General: Skin is warm and dry.     Capillary Refill: Capillary refill takes less than 2 seconds.  Neurological:     Mental Status: She is alert.     ED Results / Procedures / Treatments   Labs (all labs ordered are listed, but only abnormal results are displayed) Labs Reviewed  COMPREHENSIVE METABOLIC PANEL - Abnormal; Notable for the following components:      Result Value   Potassium 3.2 (*)    Glucose, Bld 102 (*)    Calcium 10.4 (*)    All other components within normal limits  CBC - Abnormal; Notable for the following components:   WBC 12.1 (*)    RBC 5.39 (*)    HCT 47.1 (*)    All other components within normal limits  I-STAT VENOUS BLOOD GAS, ED - Abnormal; Notable for the following components:   pH, Ven 7.524 (*)    pCO2, Ven 30.9 (*)    pO2, Ven 89 (*)    Acid-Base Excess 3.0 (*)    Hemoglobin 15.3 (*)    All other components within normal limits  I-STAT CHEM 8, ED - Abnormal; Notable for the following components:   Potassium 3.3 (*)    Glucose, Bld 105 (*)    Calcium, Ion 1.10 (*)    Hemoglobin 15.6 (*)    All other components within normal limits  TROPONIN I (HIGH SENSITIVITY) - Abnormal; Notable for the following components:   Troponin I (High Sensitivity) 18 (*)    All other components within normal limits  RESP PANEL BY RT-PCR (RSV, FLU A&B, COVID)  RVPGX2  HCG, SERUM, QUALITATIVE  CBG MONITORING, ED  TROPONIN I (HIGH SENSITIVITY)    EKG None  Radiology DG Chest Port 1 View  Result  Date: 04/16/2023 CLINICAL DATA:  Shortness of breath. EXAM: PORTABLE CHEST 1 VIEW COMPARISON:  02/03/2023. FINDINGS: Low lung volume. There is mild pulmonary vascular congestion. There are probable atelectatic changes at the lung bases, left more than right. Bilateral lung fields are otherwise clear. No acute consolidation or lung collapse. No pneumothorax. Apparent  blunting of bilateral lateral costophrenic angles is nonspecific and may be due to trace pleural effusion versus superimposed lung parenchymal opacities. Stable cardio-mediastinal silhouette. No acute osseous abnormalities. The soft tissues are within normal limits. IMPRESSION: *Mild pulmonary vascular congestion, new since the prior study. *Otherwise stable exam. Electronically Signed   By: Jules Schick M.D.   On: 04/16/2023 09:12    Procedures Procedures    Medications Ordered in ED Medications  acetaminophen (TYLENOL) tablet 1,000 mg (1,000 mg Oral Not Given 04/16/23 0939)  ondansetron (ZOFRAN) injection 4 mg (4 mg Intravenous Given 04/16/23 1914)    ED Course/ Medical Decision Making/ A&P                                 Medical Decision Making Amount and/or Complexity of Data Reviewed Labs: ordered. Radiology: ordered.  Risk OTC drugs. Prescription drug management.   Vital signs stable, patient's with stable oxygen saturation on room air upon my initial assessment.  Physical exam with crackles at bilateral bases consistent with her interstitial lung disease.  Blood gas with pH 7.52, pCO2 31.  CMP with potassium 3.2, no other gross metabolic or electrolyte abnormalities.  CBC with WBC 12.1, hemoglobin 14.4.  BGL 72.  COVID/flu negative.  Troponin 18, 14 on repeat.  EKG with lateral T wave flattening, seen on prior.  Chest x-ray with possible mild interstitial pulmonary edema.  Etiology of the patient's symptoms is unclear, and may be related to her interstitial lung disease.  I have low suspicion for ACS, pneumothorax,  pneumonia.  I have low suspicion for for PE, patient is stable on room air in the ED without tachycardia.  Results of laboratory and imaging evaluations were discussed with the patient and her fianc.  Discussed using all medications as previously prescribed.  Encouraged patient to follow-up with her PCP and pulmonologist.  I had an extensive discussion with the patient and her fianc regarding that I have low suspicion for life-threatening etiologies at this time and that she should contact her PCP or pulmonologist regarding chronic concerns including her sinus headaches.  She did report improvement of her headache after medication in the ED, she was administered Zofran.  She voiced understanding and stated she would call her pulmonologist and her primary physician.  Strict return precautions were discussed, and the patient voiced understanding.  Patient was discharged in stable condition.        Final Clinical Impression(s) / ED Diagnoses Final diagnoses:  SOB (shortness of breath)    Rx / DC Orders ED Discharge Orders     None         Janyth Pupa, MD 04/16/23 7829    Blane Ohara, MD 04/17/23 [phone]

## 2023-04-20 ENCOUNTER — Telehealth: Payer: Commercial Managed Care - PPO | Admitting: Pulmonary Disease

## 2023-04-20 ENCOUNTER — Other Ambulatory Visit (HOSPITAL_COMMUNITY): Payer: Self-pay

## 2023-04-20 DIAGNOSIS — J849 Interstitial pulmonary disease, unspecified: Secondary | ICD-10-CM

## 2023-04-20 DIAGNOSIS — Z5181 Encounter for therapeutic drug level monitoring: Secondary | ICD-10-CM

## 2023-04-20 DIAGNOSIS — M349 Systemic sclerosis, unspecified: Secondary | ICD-10-CM | POA: Diagnosis not present

## 2023-04-20 MED ORDER — PREDNISONE 10 MG PO TABS
40.0000 mg | ORAL_TABLET | Freq: Every day | ORAL | 3 refills | Status: DC
  Filled 2023-04-20 – 2023-05-04 (×2): qty 360, 90d supply, fill #0

## 2023-04-20 MED ORDER — SULFAMETHOXAZOLE-TRIMETHOPRIM 800-160 MG PO TABS
1.0000 | ORAL_TABLET | ORAL | 3 refills | Status: DC
  Filled 2023-04-20 – 2023-05-04 (×2): qty 36, 84d supply, fill #0
  Filled 2023-09-17: qty 36, 84d supply, fill #1
  Filled 2023-12-24: qty 36, 84d supply, fill #2

## 2023-04-20 NOTE — Patient Instructions (Signed)
VISIT SUMMARY:  You were seen today for a two-month history of cough and worsening shortness of breath. You have a history of interstitial lung disease due to scleroderma and pulmonary hypertension. You also reported difficulty swallowing your medication and food, and you have been referred to gastroenterology for further evaluation. Despite these issues, you continue to work full time.  YOUR PLAN:  -INTERSTITIAL LUNG DISEASE SECONDARY TO SCLERODERMA: Interstitial lung disease is a group of lung disorders that cause scarring of lung tissues, often due to conditions like scleroderma. To address your increased shortness of breath and cough, we are starting you on Prednisone 40mg  daily. If there is no improvement, we may consider adding Rituxan. We will also check with the pharmacy for a liquid form of CellCept and resume Bactrim three times a week. Please follow up in one month.  -DYSPHAGIA: Dysphagia means difficulty swallowing. You have reported trouble swallowing your CellCept medication and food, and you have had recent hospitalizations for bowel obstructions. We have referred you to Dr. Adela Lank in gastroenterology for further evaluation and will message him to expedite your appointment.  -PULMONARY HYPERTENSION: Pulmonary hypertension is high blood pressure in the arteries of the lungs. You are currently on Macitentan (Opsumit) for this condition. We will message Dr. Milas Kocher to reassess your pulmonary hypertension and schedule a follow-up appointment with him.  INSTRUCTIONS:  Please follow up in one month for your interstitial lung disease. Additionally, we will message Dr. Adela Lank to expedite your gastroenterology appointment and Dr. Milas Kocher for a reassessment of your pulmonary hypertension. Continue taking your medications as prescribed and monitor your symptoms closely.

## 2023-04-20 NOTE — Progress Notes (Signed)
Karen [name]    [id number]    August 19, 1970  Primary Care Physician:Patel, Earlie Lou, MD  Referring Physician: Anabel Halon, MD [address] [place],  [place] [address]  Virtual Visit via Video Note  I connected with Khalil Brumleve on 04/20/23 at  4:00 PM EST by a video enabled telemedicine application and verified that I am speaking with the correct person using two identifiers.  Location: Patient: Work Provider: Pulmonary office    I discussed the limitations of evaluation and management by telemedicine and the availability of in person appointments. The patient expressed understanding and agreed to proceed.  Problem list:  Scleroderma ILD with pulmonary HTN CellCept started May 2019, Ofev started December 2020  Pulmonary hypertension-started macitentan April 2021 Transplant candidate at [facility]  HPI: 52 y.o.  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 [name] on 09/27/17 with no evidence of pulmonary hypertension Started on CellCept May 2019 and uptitrated to max dose of 1.5 mg twice daily.  She had a hospitalization in [facility] in March 2019 for hypertension.  A CT chest scan at that time showed subtle lower lung findings of atelectasis, groundglass.  She has a right thyroid nodule which was biopsied on 08/15/17 with pathology showing benign findings.  Thyroid function tests are normal.  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 Finished pulmonary rehab in 2021 and is back at work full-time Repeat right heart cath 2021 by Dr. [name] showed mild pulmonary hypertension she has been started on  macitetan  Had annual follow-up at [facility] transplant program Oct 2023 when she las pulmonary function test that show a decline in DLCO from 39% to 35%.  Reports worse exercise capacity and more fatigue She also had a repeat RHC in April 2023 which showed very mild PAH.  She was started on Actemra by Dr. Nickola Major due to decline in pulmonary function.  She got 2 doses around March and April 2024 but then developed allergic reaction to it which was stopped. Around May 2024 she had a visit with [facility] transplant center with PFTs that actually showed an improvement.  Pets: Dog, no cats, birds Occupation: Works as a Clinical biochemist in cardiology clinic Exposures: Has crawlspace but no dampness.  No mold. No hot tub, Jacuzzi. She has down comforter.  Smoking history: Never smoker Travel history: Grew up in [place].  Lived in [place] and [place] Relevant family history: No family history of lung disease.    Interim history:  The patient, with a history of interstitial lung disease secondary to scleroderma and pulmonary hypertension, presents with a two-month history of cough and worsening shortness of breath. She was recently evaluated in the emergency department, where she tested negative for COVID-19 and influenza. A chest x-ray was also performed. The patient reports difficulty swallowing, particularly with her CellCept medication, and has requested a liquid form if available. She also reports difficulty swallowing food. She has been referred to gastroenterology for this issue but has not yet been able to secure an appointment. The patient has been hospitalized three times this year for bowel obstructions. She continues to work full time despite increasing fatigue and shortness of breath.   Continues on CellCept 1.5 mg twice daily and Ofev On macitentan for pulmonary hypertension per Dr. [name]  Outpatient  Encounter Medications as of 04/20/2023  Medication Sig   acetaminophen (TYLENOL) 325 MG tablet Take 2 tablets (650 mg total) by mouth every 6 (six) hours as needed for mild pain, fever or headache (or Fever >/= 101). (Patient taking  differently: Take 325-650 mg by mouth as needed for mild pain (pain score 1-3), fever or headache (or Fever >/= 101).)   bisoprolol (ZEBETA) 5 MG tablet Take 1 tablet (5 mg total) by mouth daily.   furosemide (LASIX) 40 MG tablet Take 1 tablet (40 mg total) by mouth 3 (three) times a week. Mon, Wed, Fri (Patient taking differently: Take 40-80 mg by mouth daily as needed for fluid.)   linaclotide (LINZESS) 72 MCG capsule Take 1 capsule (72 mcg total) by mouth daily before breakfast. (Patient not taking: Reported on 04/16/2023)   macitentan (OPSUMIT) 10 MG tablet Take 1 tablet (10 mg total) by mouth daily.   mycophenolate (CELLCEPT) 500 MG tablet Take 3 tablets (1,500 mg total) by mouth every morning AND 3 tablets (1,500 mg total) every evening. (Patient taking differently: Take 500 mg by mouth twice daily. )   Nintedanib (OFEV) 150 MG CAPS Take 1 capsule (150 mg total) by mouth 2 (two) times daily.   NP THYROID 120 MG tablet Take 1 tablet (120 mg total) by mouth daily before breakfast.   pantoprazole (PROTONIX) 40 MG tablet Take 1 tablet (40 mg total) by mouth 2 (two) times daily. (Patient taking differently: Take 80 mg by mouth daily.)   potassium chloride 20 MEQ/15ML (10%) SOLN Take 15 mLs (20 mEq total) by mouth daily with furosemide (Patient taking differently: Take 20 mEq by mouth daily as needed (Lasix use).)   spironolactone (ALDACTONE) 25 MG tablet Take 1 tablet (25 mg total) by mouth 2 (two) times daily. (Patient taking differently: Take 50 mg by mouth daily.)   [DISCONTINUED] PARoxetine (PAXIL) 10 MG tablet Take 1 tablet (10 mg total) by mouth daily as directed   No facility-administered encounter medications on file as of 04/20/2023.    Physical Exam: Tele  Data Reviewed: Imaging CT chest, [facility] 07/24/2017 Dependent atelectasis, subtle groundglass opacities at the base.  2.5 cm right thyroid nodule.  I have reviewed the images personally  CT high-resolution 09/18/2017- patchy  reticular groundglass opacities, reticulation with subpleural sparing.  Mild traction bronchiectasis.  No honeycombing.  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  Aortic atherosclerosis, three-vessel coronary artery disease.  CT high-resolution 06/27/2018-stable interstitial lung disease.  CT high-resolution 06/11/2019-stable interstitial lung disease  CT high-resolution 05/19/2020-stable interstitial lung disease  CT high-resolution 11/10/2021-interstitial lung disease with slight progression compared to before  CT high-resolution 03/22/2023-stable interstitial lung disease.  subtle increase in centrilobular nodularity I have reviewed the images personally.  PFTs FENO 09/06/2017-unable to complete  07/27/2017 FVC 1.65 (54%), FEV1 1.53 (62%], F/F 93, TLC 50, DLCO 44%, DLCO/VA 131%  10/25/2017 FVC 1.51 [50%], FEV1 1.26 [51%], F/F 83  01/29/2018 FVC 1.69 [5%), FEV1 1.50 [61%], F/F 89, DLCO 10.57 [41%]  07/01/2018 FVC 1.57 [52%), FEV1 1.47 [60%], F/F 93, DLCO 12.54 [59%)  01/06/2019 FVC 1.58 [52%], FEV1 1.46 [60%], F/F 92, TLC 2.62 [50%], DLCO 9.16 [42%] Severe restriction and diffusion impairment.  FVC is stable but DLCO has worsened.  6-minute walk  10/23/2017- 144 m Post walk heart rate, stats 94, 91%  02/04/2018- 249 m Post walk heart rate, sats 101, 99%  06/06/2018-212 m Post walk heart rate, sats 86, 92%  01/30/2019-171m Post walk heart rate, sats 83, 98%  01/13/21-238 m  Labs 08/20/2017-ANA, centromere, rheumatoid factor, SCL 70-negative QuantiFERON 09/12/2017-negative Hepatitis B, C screening 09/21/2017-negative G6PD 09/25/2017-13  Labs from Dr. Nickola Major 07/12/17 Rheumatoid factor < 10, CCP-8, ANCA-negative, double-stranded DNA-negative, Smith antibody-negative, SCL 70-negative, RNP-negative SSA 5.8, SSB 1.5 Smith antibody-negative, ANA IFA-negative Myositis panel-negative CK 165, C4-30, C3-168, total  complement greater than 60, aldolase 8.8 Sed rate 9, CRP 0.9  CBC, hepatic panel 09/16/2020-within normal limits  Cardiac Cardiac cath 09/05/2019 PA = 38/16 (25) PCW = 6 Fick cardiac output/index = 5.5/3.4 PVR = 3.4 WU Mild pulmonary hypertension  Cardiac cath 08/16/2021 PA = 36/14 (23) PCW = 3 Fick cardiac output/index = 7.3/4.4 PVR = 2.7  Overnight oximetry 01/30/2019- test time 8 hours 25 minutes Lowest O2 sat 87%.  Mean O2 sat 95%.  Time spent less than 88% - 1 minute 30 seconds No significant desaturation.  Does not need supplemental oxygen.  Assessment:  Scleroderma ILD Continue on CellCept 1.5 g twice daily, Bactrim prophylaxis and ofev.  Monitoring labs at recent ED visit were okay. She got 2 doses of Actemra in early 2024 due to progression of disease however developed hypersensitivity reaction and they were stopped Unable to do pulmonary rehab as it interferes with her work and she is exercising with walking at home. Follow-up at [facility] transplant   Increased shortness of breath and cough for over two months. Recent ED visit with negative respiratory virus panel and stable CT scan. Difficulty swallowing CellCept.  -Start Prednisone 40mg  daily for potential scleroderma flare. -Consider adding Rituxan if no improvement with Prednisone. -Check with pharmacy for CellCept suspension and conversion. -Resume Bactrim three times a week. -Follow-up in one month.  Dysphagia Esophageal dysfunction, esophageal stricture, GERD Difficulty swallowing, especially with CellCept. Recent hospitalizations for bowel obstruction. -Refer to gastroenterology (Dr. Adela Lank) for evaluation and management. -Message Dr. Adela Lank for expedited appointment.  Pulmonary Hypertension On Macitentan (Opsumit).  -Message Dr. [name] for reassessment of pulmonary hypertension. -Schedule follow-up appointment with Dr. Milas Kocher.   Plan/Recommendations: - Continue CellCept, Ofev - Start  prednisone 40 mg - Bactrim prophylaxis.   Chilton Greathouse MD [facility] 04/20/2023, 4:01 PM   I discussed the assessment and treatment plan with the patient. The patient was provided an opportunity to ask questions and all were answered. The patient agreed with the plan and demonstrated an understanding of the instructions.   The patient was advised to call back or seek an in-person evaluation if the symptoms worsen or if the condition fails to improve as anticipated.

## 2023-04-23 ENCOUNTER — Other Ambulatory Visit (HOSPITAL_COMMUNITY): Payer: Self-pay

## 2023-04-23 ENCOUNTER — Telehealth: Payer: Self-pay | Admitting: Pharmacist

## 2023-04-23 ENCOUNTER — Telehealth: Payer: Self-pay | Admitting: *Deleted

## 2023-04-23 DIAGNOSIS — J849 Interstitial pulmonary disease, unspecified: Secondary | ICD-10-CM

## 2023-04-23 DIAGNOSIS — M349 Systemic sclerosis, unspecified: Secondary | ICD-10-CM

## 2023-04-23 MED ORDER — MYCOPHENOLATE MOFETIL 200 MG/ML PO SUSR
1500.0000 mg | Freq: Two times a day (BID) | ORAL | 5 refills | Status: DC
  Filled 2023-04-23: qty 480, 30d supply, fill #0
  Filled 2023-05-04: qty 480, 32d supply, fill #0
  Filled 2023-09-04: qty 480, 32d supply, fill #1

## 2023-04-23 NOTE — Telephone Encounter (Signed)
Patient was scheduled to see Dr Adela Lank on 04/03/23 but cancelled due to "scheduling conflicts. I offered appointment with our nurse practitioner, Alcide Evener on 04/30/23 for sooner evaluation, but patient states that she needs a Thursday appointment due to her work schedule. I also offered Dr Lanetta Inch next available work in on 05/16/23 at 11:30 am, but again needs a Thursday appointment. I advised that unfortunately, my next availability with Dr Adela Lank is 07/12/23. Patient states that she will take the 05/16/23 appointment and try to get off work. Patient scheduled.

## 2023-04-23 NOTE — Telephone Encounter (Signed)
-----   Message from Benancio Deeds sent at 04/20/2023  4:53 PM EST ----- Renato Gails,  I looked at my schedule, could offer this patient 1/8 at 1130  if she can make it then. I don't have any openings in the office until then. Thanks  Dr. Mervyn Skeeters ----- Message ----- From: Chilton Greathouse, MD Sent: 04/20/2023   4:48 PM EST To: Benancio Deeds, MD  Cleone Slim Brett Canales This patient with scleroderma, esophageal dysfunction and worsening swallowing difficulty has been waiting for an appointment with you.  Do think you can see her sooner?  This will be much appreciated.  Thank you  Praveen

## 2023-04-23 NOTE — Telephone Encounter (Signed)
Per test claim, copay is $0 for of mycophenolate solution.  Called patient to advise. She will wait for notification from [facility] when med is ready for pickup  Chesley Mires, PharmD, MPH, BCPS, CPP Clinical Pharmacist (Rheumatology and Pulmonology)

## 2023-04-23 NOTE — Telephone Encounter (Signed)
-----   Message from [facility] sent at 04/20/2023  4:47 PM EST ----- She is finding it hard to swallow CellCept pills.  Do think we can change her to CellCept liquid preparation?  Thank you

## 2023-04-24 ENCOUNTER — Other Ambulatory Visit (HOSPITAL_COMMUNITY): Payer: Self-pay

## 2023-04-26 ENCOUNTER — Other Ambulatory Visit (HOSPITAL_COMMUNITY): Payer: Self-pay

## 2023-04-26 ENCOUNTER — Telehealth: Payer: Self-pay

## 2023-04-26 DIAGNOSIS — J329 Chronic sinusitis, unspecified: Secondary | ICD-10-CM | POA: Diagnosis not present

## 2023-04-26 DIAGNOSIS — I10 Essential (primary) hypertension: Secondary | ICD-10-CM | POA: Diagnosis not present

## 2023-04-26 DIAGNOSIS — B9689 Other specified bacterial agents as the cause of diseases classified elsewhere: Secondary | ICD-10-CM | POA: Diagnosis not present

## 2023-04-26 NOTE — Telephone Encounter (Signed)
Copied from CRM [id number]. Topic: General - Other >> Apr 26, 2023 12:40 PM Sasha H wrote: Reason for CRM: Pt would like a call back regarding FMLA paperwork and also requesting copies

## 2023-04-26 NOTE — Telephone Encounter (Signed)
Spoke to patient

## 2023-05-03 ENCOUNTER — Other Ambulatory Visit: Payer: Self-pay

## 2023-05-03 ENCOUNTER — Other Ambulatory Visit (HOSPITAL_COMMUNITY): Payer: Self-pay

## 2023-05-04 ENCOUNTER — Other Ambulatory Visit (HOSPITAL_COMMUNITY): Payer: Self-pay

## 2023-05-07 ENCOUNTER — Encounter (HOSPITAL_COMMUNITY): Payer: Self-pay

## 2023-05-07 ENCOUNTER — Other Ambulatory Visit (HOSPITAL_COMMUNITY): Payer: Self-pay

## 2023-05-10 ENCOUNTER — Other Ambulatory Visit (HOSPITAL_COMMUNITY): Payer: Self-pay

## 2023-05-10 ENCOUNTER — Ambulatory Visit: Payer: Commercial Managed Care - PPO | Admitting: Internal Medicine

## 2023-05-10 ENCOUNTER — Other Ambulatory Visit (HOSPITAL_COMMUNITY): Payer: Self-pay | Admitting: Pharmacy Technician

## 2023-05-10 NOTE — Progress Notes (Signed)
 Specialty Pharmacy Refill Coordination Note  Karen [name] is a 53 y.o. female contacted today regarding refills of specialty medication(s) Nintedanib  Esylate (Ofev )   Patient requested Delivery   Delivery date: 05/14/23   Verified address: [address]  [place]   Medication will be filled on 05/11/23.   Patient aware have to order;  Sent message to [place] about copay card rejecting Pharmacy not Contracted

## 2023-05-11 ENCOUNTER — Other Ambulatory Visit (HOSPITAL_COMMUNITY): Payer: Self-pay

## 2023-05-14 ENCOUNTER — Other Ambulatory Visit: Payer: Self-pay

## 2023-05-15 ENCOUNTER — Other Ambulatory Visit: Payer: Self-pay

## 2023-05-16 ENCOUNTER — Other Ambulatory Visit (HOSPITAL_COMMUNITY): Payer: Self-pay

## 2023-05-16 ENCOUNTER — Encounter: Payer: Self-pay | Admitting: Gastroenterology

## 2023-05-16 ENCOUNTER — Other Ambulatory Visit: Payer: Self-pay

## 2023-05-16 ENCOUNTER — Ambulatory Visit (INDEPENDENT_AMBULATORY_CARE_PROVIDER_SITE_OTHER): Payer: Commercial Managed Care - PPO | Admitting: Gastroenterology

## 2023-05-16 VITALS — BP 122/70 | HR 71 | Ht 64.0 in | Wt 135.0 lb

## 2023-05-16 DIAGNOSIS — K3184 Gastroparesis: Secondary | ICD-10-CM

## 2023-05-16 DIAGNOSIS — K219 Gastro-esophageal reflux disease without esophagitis: Secondary | ICD-10-CM | POA: Diagnosis not present

## 2023-05-16 DIAGNOSIS — K5904 Chronic idiopathic constipation: Secondary | ICD-10-CM

## 2023-05-16 DIAGNOSIS — R1310 Dysphagia, unspecified: Secondary | ICD-10-CM

## 2023-05-16 DIAGNOSIS — M349 Systemic sclerosis, unspecified: Secondary | ICD-10-CM

## 2023-05-16 MED ORDER — MOTEGRITY 2 MG PO TABS
2.0000 mg | ORAL_TABLET | Freq: Every day | ORAL | 1 refills | Status: DC
  Filled 2023-05-16: qty 30, 30d supply, fill #0
  Filled 2023-06-06 – 2023-06-18 (×2): qty 90, 90d supply, fill #0
  Filled 2023-12-24: qty 90, 90d supply, fill #1

## 2023-05-16 MED ORDER — VOQUEZNA 20 MG PO TABS: 20.0000 mg | ORAL_TABLET | Freq: Every day | ORAL | 0 refills | Status: DC

## 2023-05-16 NOTE — Patient Instructions (Addendum)
 You have been scheduled for an endoscopy. Please follow written instructions given to you at your visit today.  If you use inhalers (even only as needed), please bring them with you on the day of your procedure.  If you take any of the following medications, they will need to be adjusted prior to your procedure:   DO NOT TAKE 7 DAYS PRIOR TO TEST- Trulicity (dulaglutide) Ozempic, Wegovy (semaglutide) Mounjaro (tirzepatide) Bydureon Bcise (exanatide extended release)  DO NOT TAKE 1 DAY PRIOR TO YOUR TEST Rybelsus (semaglutide) Adlyxin (lixisenatide) Victoza (liraglutide) Byetta (exanatide) ___________________________________________________________________________   We have given you samples of the following medication to take: Voquezna  20 mg: take once daily  We have sent the following medications to your pharmacy for you to pick up at your convenience: Motegrity  2 mg: Take once daily  Thank you for entrusting me with your care and for choosing [facility], Dr. Elspeth Naval    If your blood pressure at your visit was 140/90 or greater, please contact your primary care physician to follow up on this. ______________________________________________________  If you are age 85 or older, your body mass index should be between 23-30. Your Body mass index is 23.17 kg/m. If this is out of the aforementioned range listed, please consider follow up with your Primary Care Provider.  If you are age 60 or younger, your body mass index should be between 19-25. Your Body mass index is 23.17 kg/m. If this is out of the aformentioned range listed, please consider follow up with your Primary Care Provider.  ________________________________________________________  The [facility] providers would like to encourage you to use MYCHART to communicate with providers for non-urgent requests or questions.  Due to long hold times on the telephone, sending your provider a message by [facility]  may be a faster and more efficient way to get a response.  Please allow 48 business hours for a response.  Please remember that this is for non-urgent requests.  _______________________________________________________  Due to recent changes in healthcare laws, you may see the results of your imaging and laboratory studies on MyChart before your provider has had a chance to review them.  We understand that in some cases there may be results that are confusing or concerning to you. Not all laboratory results come back in the same time frame and the provider may be waiting for multiple results in order to interpret others.  Please give us  48 hours in order for your provider to thoroughly review all the results before contacting the office for clarification of your results.

## 2023-05-16 NOTE — Progress Notes (Signed)
 HPI :  53 year old female with a history of scleroderma, GERD, gastroparesis, chronic constipation, interstitial lung disease on lung transplant list, here for new patient assessment for some of these issues.  Of note she has previously been seen by [facility] as well as [facility].  She has had ongoing history of dysphagia over time.  Her first and only EGD appears to be September 2019 with erosive reflux esophagitis noted, but the exam was otherwise limited due to retained food in the stomach.  It does not sound like she is ever had a complete EGD.  No obvious stricture or stenosis noted.  She had a barium swallow in 2019 showing dysmotility. She also has a history of gastroparesis suspected.  She had a gastric emptying study in 2019 showing gastroparesis.  In regards to management of these issues she has been on a few different PPIs in the past.  She states she was still having significant reflux despite taking Nexium , so a switch to Protonix  40 mg twice daily.  She thinks this worked better than the Nexium  but she still has breakthrough symptoms that bother her, especially at night.  She does have dysphagia to pills and to her foods.  She has to chew food extremely well and eat slowly.  She does not have any dysphagia to liquids.  In regards to her gastroparesis she has early satiety, eats small meals per day, no large meals.  She does have nausea and vomits perhaps twice per month.  She has not eaten anything today as he states she feels like she is quite full.  She had been on Reglan  remotely a few years ago for short course, sounds like she had some mild involuntary movements and the drug was stopped.  I see that there have been efforts to get her Motegrity  in the past for her chronic constipation and gastroparesis but this was not approved because she did not fail Linzess .  She states she was placed on a trial of Linzess  but it upset her stomach and cause nausea show she could not take it.  She  has been on a stool softener for constipation and has a bowel movement perhaps once weekly to every other day, it varies.  Complicated her course over the past year, she has been in the hospital for recurrent small bowel obstructions.  She is followed by general surgery.  This has been thought to be due to adhesive disease, she is had abdominal hernia repair, cholecystectomy, and uterus surgery in the past.  She is also followed by pulmonary for interstitial lung disease.  She is not on supplemental oxygen  but is on a transplant list at [facility].  She states she has been stable in regards to her breathing recently but was in the perhaps 4 to 6 weeks ago for shortness of breath.  She states she does have some exertional dyspnea but at baseline she has been doing better lately.  Prior workup: EGD September 2019 with erosive reflux esophagitis, patulous GE junction, no dilation and limited exam due to retained food in the stomach.    Colonoscopy 01/02/2019 was normal, repeat in 10 years (2030).   BaS 2019 @ [facility] FINDINGS: Esophageal distention: Normal without mass or stricture Filling defects:  None 12.5 mm barium tablet: Easily passed from oral cavity to stomach without delay Motility: Mild impairment of esophageal motility with incomplete clearance of barium by primary peristaltic waves. Mucosa:  Smooth without irregularity or ulceration Hypopharynx/cervical esophagus: Mild laryngeal penetration without  aspiration. Minimal vallecular and piriform sinus residuals. Hiatal hernia:  Absent GE reflux:  Not identified during exam   IMPRESSION: Laryngeal penetration without aspiration. Mild esophageal dysmotility without stricture.    GES 2019 FINDINGS: Expected location of the stomach in the left upper quadrant. Ingested meal empties the stomach gradually over the course of the study.  12.4% emptied at 1 hr ( normal >= 10%)  24.2% emptied at 2 hr ( normal >= 40%)  36.1% emptied at 3 hr (  normal >= 70%)  53.8% emptied at 4 hr ( normal >= 90%)    Echo 07/21/21: EF 70-75%,     CT C/A/P 06/06/22: IMPRESSION: 1. CT findings consistent with a small-bowel obstruction. The exact point of obstruction is not identified for certain but appears to be in the midline of the lower abdomen. 2. Stable changes of interstitial lung disease. No acute superimposed pulmonary process. 3. Stable borderline mediastinal and hilar lymph nodes, likely reactive and due to the patient's underlying lung disease. 4. Bilateral renal calculi but no obstructing ureteral calculi or hydroureteronephrosis. 5. Enlarged fibroid uterus. 6. Status post cholecystectomy. No biliary dilatation.   CT abdomen / pelvis 10/09/22: IMPRESSION: Changes consistent with partial small bowel obstruction. Transition point appears to lie in the anterior abdomen just to the left of the midline. This is likely related to adhesions.   Remainder of the exam is stable with chronic findings of uterine fibroids and nonobstructing renal calculi.   Aortic Atherosclerosis (ICD10-I70.0).   CT abdomen / pelvis 02/03/23: IMPRESSION: Changes consistent with partial small bowel obstruction in the mid ileum related to transition zone in the anterior abdomen. This is likely related to adhesions.   Uterine fibroid change.   Nonobstructing left renal stone.   Past Medical History:  Diagnosis Date   Acute respiratory failure with hypoxia due to flash pulmonary edema requiring intubation 02/13/2020   Calculus of gallbladder with acute cholecystitis without obstruction    CHF (congestive heart failure) (HCC)    Gastroparesis    GERD (gastroesophageal reflux disease)    HTN (hypertension) 03/23/2017   Hypothyroidism    ILD (interstitial lung disease) (HCC) 02/08/2018   Multinodular thyroid     benign FNA 08/2017.   Perimenopause 02/27/2019   Pulmonary edema    Pulmonary hypertension (HCC) 08/04/2021   Raynaud's disease  11/10/2021   Scleroderma (HCC)    Sjogren's syndrome (HCC) 08/30/2017   Status post laparoscopic cholecystectomy 02/13/20 02/13/2020   Vitamin D  deficiency disease 02/27/2019     Past Surgical History:  Procedure Laterality Date   ABDOMINAL HERNIA REPAIR     BUNIONECTOMY Bilateral 10/2018   CHOLECYSTECTOMY N/A 02/13/2020   Procedure: LAPAROSCOPIC CHOLECYSTECTOMY;  Surgeon: Mavis Anes, MD;  Location: [facility];  Service: General;  Laterality: N/A;   COLONOSCOPY WITH PROPOFOL  N/A 01/02/2019   Procedure: COLONOSCOPY WITH PROPOFOL ;  Surgeon: Shaaron Lamar HERO, MD;  Location: [facility];  Service: Endoscopy;  Laterality: N/A;  12:30pm   ESOPHAGOGASTRODUODENOSCOPY (EGD) WITH PROPOFOL  N/A 01/28/2018   erosive reflux esophagitis, patulous EG Junction, no dilation, incomplete EGD due to retained food in stomach. GES thereafter with delayed gastric emptying.    RIGHT HEART CATH N/A 09/27/2017   Procedure: RIGHT HEART CATH;  Surgeon: Cherrie Toribio SAUNDERS, MD;  Location: [facility] INVASIVE CV LAB;  Service: Cardiovascular;  Laterality: N/A;   RIGHT HEART CATH N/A 09/05/2019   Procedure: RIGHT HEART CATH;  Surgeon: Cherrie Toribio SAUNDERS, MD;  Location: [facility] INVASIVE CV LAB;  Service: Cardiovascular;  Laterality:  N/A;   RIGHT/LEFT HEART CATH AND CORONARY ANGIOGRAPHY N/A 08/18/2021   Procedure: RIGHT/LEFT HEART CATH AND CORONARY ANGIOGRAPHY;  Surgeon: Cherrie Toribio SAUNDERS, MD;  Location: [facility] INVASIVE CV LAB;  Service: Cardiovascular;  Laterality: N/A;   UTERINE FIBROID SURGERY     Family History  Problem Relation Age of Onset   Hypertension Father    Hypertension Sister    Hypertension Brother    Diabetes Maternal Grandfather    Diabetes Maternal Uncle    Diabetes Mother    Colon cancer Neg Hx    Social History   Tobacco Use   Smoking status: Never   Smokeless tobacco: Never  Vaping Use   Vaping status: Former  Substance Use Topics   Alcohol use: No   Drug use: No   Current Outpatient Medications   Medication Sig Dispense Refill   acetaminophen  (TYLENOL ) 325 MG tablet Take 2 tablets (650 mg total) by mouth every 6 (six) hours as needed for mild pain, fever or headache (or Fever >/= 101). (Patient taking differently: Take 325-650 mg by mouth as needed for mild pain (pain score 1-3), fever or headache (or Fever >/= 101).) 12 tablet 0   bisoprolol  (ZEBETA ) 5 MG tablet Take 1 tablet (5 mg total) by mouth daily. 90 tablet 3   furosemide  (LASIX ) 40 MG tablet Take 1 tablet (40 mg total) by mouth 3 (three) times a week. Mon, Wed, Fri (Patient taking differently: Take 40-80 mg by mouth daily as needed for fluid.) 30 tablet 6   macitentan  (OPSUMIT ) 10 MG tablet Take 1 tablet (10 mg total) by mouth daily. 30 tablet 11   mycophenolate  (CELLCEPT ) 200 MG/ML suspension Take 7.5 mLs (1,500 mg total) by mouth 2 (two) times daily. 480 mL 5   Nintedanib  (OFEV ) 150 MG CAPS Take 1 capsule (150 mg total) by mouth 2 (two) times daily. 60 capsule 5   NP THYROID  120 MG tablet Take 1 tablet (120 mg total) by mouth daily before breakfast. 90 tablet 0   pantoprazole  (PROTONIX ) 40 MG tablet Take 1 tablet (40 mg total) by mouth 2 (two) times daily. (Patient taking differently: Take 80 mg by mouth daily.) 180 tablet 1   potassium chloride  20 MEQ/15ML (10%) SOLN Take 15 mLs (20 mEq total) by mouth daily with furosemide  (Patient taking differently: Take 20 mEq by mouth daily as needed (Lasix  use).) 450 mL 3   predniSONE  (DELTASONE ) 10 MG tablet Take 4 tablets (40 mg total) by mouth daily with breakfast. 360 tablet 3   spironolactone  (ALDACTONE ) 25 MG tablet Take 1 tablet (25 mg total) by mouth 2 (two) times daily. (Patient taking differently: Take 50 mg by mouth daily.) 180 tablet 1   sulfamethoxazole -trimethoprim  (BACTRIM  DS) 800-160 MG tablet Take 1 tablet by mouth 3 (three) times a week. 36 tablet 3   linaclotide  (LINZESS ) 72 MCG capsule Take 1 capsule (72 mcg total) by mouth daily before breakfast. (Patient not taking:  Reported on 04/16/2023) 30 capsule 2   No current facility-administered medications for this visit.   Allergies  Allergen Reactions   Lisinopril Hives, Swelling and Other (See Comments)   Tocilizumab  Other (See Comments)    Swelling and shortness of breath sharpe pains in back and chest tightness.     Review of Systems: All systems reviewed and negative except where noted in HPI.   Lab Results  Component Value Date   WBC 12.1 (H) 04/16/2023   HGB 15.3 (H) 04/16/2023   HCT 45.0 04/16/2023  MCV 87.4 04/16/2023   PLT 241 04/16/2023    Lab Results  Component Value Date   NA 141 04/16/2023   CL 105 04/16/2023   K 3.9 04/16/2023   CO2 25 04/16/2023   BUN 8 04/16/2023   CREATININE 0.82 04/16/2023   GFRNONAA >60 04/16/2023   CALCIUM 10.4 (H) 04/16/2023   PHOS 2.4 10/02/2017   ALBUMIN 4.1 04/16/2023   GLUCOSE 102 (H) 04/16/2023    Lab Results  Component Value Date   ALT 14 04/16/2023   AST 21 04/16/2023   ALKPHOS 93 04/16/2023   BILITOT 1.1 04/16/2023      Physical Exam: BP 122/70   Pulse 71   Ht 5' 4 (1.626 m)   Wt 135 lb (61.2 kg)   BMI 23.17 kg/m  Constitutional: Pleasant,well-developed, female in no acute distress. HEENT: Normocephalic and atraumatic. Conjunctivae are normal. No scleral icterus. Neck supple.  Cardiovascular: Normal rate, regular rhythm.  Pulmonary/chest: Effort normal and breath sounds normal.  Abdominal: Soft, nondistended, nontender.  There are no masses palpable. No hepatomegaly. Extremities: no edema Lymphadenopathy: No cervical adenopathy noted. Neurological: Alert and oriented to person place and time. Skin: Skin is warm and dry. No rashes noted. Psychiatric: Normal mood and affect. Behavior is normal.   ASSESSMENT: 53 y.o. female here for assessment of the following  1. Dysphagia, unspecified type   2. Gastroesophageal reflux disease, unspecified whether esophagitis present   3. Gastroparesis   4. Chronic idiopathic  constipation   5. Scleroderma (HCC)    Patient unfortunately has significant dysmotility of her bowel in the setting of scleroderma.  Dysphagia from esophageal dysmotility, gastroparesis, chronic constipation.  We discussed each of these issues today.  Her dysphagia is going to be difficult to manage given the effects of scleroderma on her esophagus.  We do not have any medication to help her motility for her esophagus.  She will continue to chew food well, eat small bites. Her reflux is likely worse due to combination of dysmotility and her gastroparesis.  I do think there is room for her gastroparesis to be better controlled, we discussed options.  There was not any obvious stricture on barium study or prior EGD.  I do think a repeat EGD would be useful as she has never had a complete evaluation of her stomach due to retained food, ensure no other concerning pathology there to be contributing to this.  We would also assess for erosive esophagitis/Barrett's, reassess for any peptic stricture that has developed.  Given her respiratory status I think it be best to perform her procedure at the hospital for anesthesia support.  I would ask that she stay on liquid diet for 2 days prior to the exam to help clear her stomach, the day prior to the exam she should be on clear liquid diet only.  She is in agreement with this.  Regarding her reflux she is having significant breakthrough despite Protonix  twice daily, again due to factors as above.  I had a free sample of Voquenza to give her today, will try 20 mg for the next 10 days.  If this works better for her we can get her a prescription and see if covered by insurance.  For her gastroparesis, EGD will make sure her pyloric outlet is patent.  Sounds like she had some mild involuntary movements on oral Reglan .  Given her chronic constipation I think Motegrity  is a reasonable option, we will give her a prescription for this to see if that  will help both of those  issues.  Assuming she still may have some gastroparesis symptoms, could give consideration for trial of Gimoti  (intranasal metoclopramide ) for which the risk for extra pyramid of side effects is lower, there has been no reported tar dive dyskinesia with Gimoti .  Could also consider another evaluation at [facility] for possible endoscopic therapy to determine if she is a candidate for that.  Otherwise of note she has had recurrent small bowel obstructions over the past year thought to be due to adhesive disease.  She is doing better recently, she follows with general surgery, conservative measures at this time.  PLAN: - EGD to be done at the hospital - liquid diet for 2 days prior, clear liquid 1 day prior - switch out protonix  for trial of Voquezna  20mg  / day for 10 day trial - samples given, she will contact me if it helps will give her a script - prescription for motegrity  daily - failed linzess  - consideration for Gimoti  (think lower risk of TD / movements) pending her course - f/u general surgery history of bowel obstructions secondary to adhesions  I spent 60  minutes of time, including in depth chart review, face-to-face time with the patient, and documentation.     Marcey Naval, MD [facility]  CC: Tobie Suzzane POUR, MD

## 2023-05-18 ENCOUNTER — Other Ambulatory Visit: Payer: Self-pay

## 2023-05-18 ENCOUNTER — Other Ambulatory Visit (HOSPITAL_COMMUNITY): Payer: Self-pay

## 2023-05-21 ENCOUNTER — Other Ambulatory Visit: Payer: Self-pay

## 2023-05-21 ENCOUNTER — Other Ambulatory Visit (HOSPITAL_COMMUNITY): Payer: Self-pay

## 2023-05-22 ENCOUNTER — Other Ambulatory Visit (HOSPITAL_COMMUNITY): Payer: Self-pay

## 2023-05-22 ENCOUNTER — Other Ambulatory Visit: Payer: Self-pay

## 2023-05-22 ENCOUNTER — Encounter: Payer: Self-pay | Admitting: Internal Medicine

## 2023-05-22 NOTE — Progress Notes (Signed)
 only reason we can get [employer]  is because it's an employee, and they allow us  to order through [employer]. It's limited distribution, so Luke SQUIBB said the copay card is probably correct that we are not contracted.[facility] $0 override.   LVM for patient about new shipping date Ship 1/16 for 1/17.  Sandi aware & ordering. per Sandi Tomorrow 05/23/23 or Thursday 05/24/23. I'd say for sure tomorrow, but it's a [facility] one, and both [facility] and [facility] have been having delays

## 2023-05-23 ENCOUNTER — Other Ambulatory Visit: Payer: Self-pay

## 2023-05-24 ENCOUNTER — Ambulatory Visit: Payer: Commercial Managed Care - PPO | Admitting: Internal Medicine

## 2023-05-31 ENCOUNTER — Ambulatory Visit: Payer: Commercial Managed Care - PPO | Admitting: Neurology

## 2023-06-06 ENCOUNTER — Other Ambulatory Visit (HOSPITAL_COMMUNITY): Payer: Self-pay

## 2023-06-06 ENCOUNTER — Inpatient Hospital Stay: Payer: Self-pay | Admitting: Internal Medicine

## 2023-06-06 ENCOUNTER — Encounter (HOSPITAL_COMMUNITY): Payer: Self-pay | Admitting: Gastroenterology

## 2023-06-06 NOTE — Progress Notes (Signed)
Attempted to obtain medical history for pre op call via telephone, unable to reach at this time. HIPAA compliant voicemail message left requesting return call to pre surgical testing department.

## 2023-06-07 ENCOUNTER — Ambulatory Visit: Payer: Commercial Managed Care - PPO | Admitting: Pulmonary Disease

## 2023-06-11 NOTE — Telephone Encounter (Signed)
PT called to have the nurse explain the reason for 2 day prep. She also needs to have a PA for motegrity and voquenza. Please advise.

## 2023-06-12 ENCOUNTER — Other Ambulatory Visit: Payer: Self-pay

## 2023-06-12 NOTE — Telephone Encounter (Signed)
 Called and spoke to patient.  She understands she needs to be on liquids all day today and clear liquids all day tomorrow and on Thursday up until 3:30 am.  She can't find her instructions so I will resend those to her via MyChart.  Patient indicated she was unable to pick up Motegrity  and Voquezna  since her insurance did not cover them.  Not sure if the Pharmacy team attempted PA's for those.  Will sent a message to inquire

## 2023-06-12 NOTE — Progress Notes (Signed)
 Specialty Pharmacy Refill Coordination Note  Karen [name] is a 53 y.o. female contacted today regarding refills of specialty medication(s) Nintedanib  Esylate (Ofev )   Patient requested Delivery   Delivery date: 06/20/23   Verified address: [address]  [name]   Medication will be filled on 02.11.25.

## 2023-06-13 NOTE — Anesthesia Preprocedure Evaluation (Signed)
 Anesthesia Evaluation    Reviewed: Allergy & Precautions, Patient's Chart, lab work & pertinent test results, reviewed documented beta blocker date and time   History of Anesthesia Complications Negative for: history of anesthetic complications  Airway        Dental   Pulmonary  ILD          Cardiovascular hypertension, Pt. on home beta blockers and Pt. on medications      Neuro/Psych negative neurological ROS     GI/Hepatic Neg liver ROS,GERD  ,,  Endo/Other  Hypothyroidism    Renal/GU negative Renal ROS  negative genitourinary   Musculoskeletal negative musculoskeletal ROS (+)    Abdominal   Peds  Hematology negative hematology ROS (+)   Anesthesia Other Findings Day of surgery medications reviewed with patient.  Reproductive/Obstetrics negative OB ROS                             Anesthesia Physical Anesthesia Plan  ASA: 2  Anesthesia Plan: MAC   Post-op Pain Management: Minimal or no pain anticipated   Induction:   PONV Risk Score and Plan: 2 and Treatment may vary due to age or medical condition and Propofol  infusion  Airway Management Planned: Natural Airway  Additional Equipment: None  Intra-op Plan:   Post-operative Plan:   Informed Consent:   Plan Discussed with:   Anesthesia Plan Comments:        Anesthesia Quick Evaluation

## 2023-06-14 ENCOUNTER — Encounter (HOSPITAL_COMMUNITY): Payer: Self-pay | Admitting: Anesthesiology

## 2023-06-14 ENCOUNTER — Telehealth: Payer: Self-pay | Admitting: Pharmacy Technician

## 2023-06-14 ENCOUNTER — Other Ambulatory Visit (HOSPITAL_COMMUNITY): Payer: Self-pay

## 2023-06-14 ENCOUNTER — Ambulatory Visit (HOSPITAL_COMMUNITY)
Admission: RE | Admit: 2023-06-14 | Payer: Commercial Managed Care - PPO | Source: Home / Self Care | Admitting: Gastroenterology

## 2023-06-14 ENCOUNTER — Telehealth: Payer: Self-pay

## 2023-06-14 ENCOUNTER — Encounter (HOSPITAL_COMMUNITY): Admission: RE | Payer: Self-pay | Source: Home / Self Care

## 2023-06-14 DIAGNOSIS — R1310 Dysphagia, unspecified: Secondary | ICD-10-CM

## 2023-06-14 DIAGNOSIS — K219 Gastro-esophageal reflux disease without esophagitis: Secondary | ICD-10-CM

## 2023-06-14 SURGERY — ESOPHAGOGASTRODUODENOSCOPY (EGD) WITH PROPOFOL
Anesthesia: Monitor Anesthesia Care

## 2023-06-14 NOTE — Telephone Encounter (Signed)
 Pharmacy Patient Advocate Encounter   Received notification from CoverMyMeds that prior authorization for MOTEGRITY  2MG  is required/requested.   Insurance verification completed.   The patient is insured through [facility] .   Per test claim: PA required; PA submitted to above mentioned insurance via CoverMyMeds Key/confirmation #/EOC [id number] Status is pending

## 2023-06-14 NOTE — Telephone Encounter (Signed)
 Called and REscheduled patient for her EGD that was scheduled for today which she had to cancel due to Covid.  New case scheduled for 3-27 at 11:00am.  Case: [id number]  Instructions sent to patient via MyChart.

## 2023-06-14 NOTE — Telephone Encounter (Signed)
 Inbound call from patient, states she believes she had canceled her appointment when she spoke to a nurse either Yesterday or on the 4th. Patient is unsure what day. Advised patient that, her appointment had not been cancelled. Patient states she has Covid and would like to speak with a nurse for further steps.

## 2023-06-14 NOTE — Progress Notes (Signed)
 Patient stated she called the office yesterday to tell them she had covid and needed to cancel the procedure. Called patient this morning and she stated she had covid and was not coming. Told patient to call the office when wanting to reschedule.

## 2023-06-14 NOTE — Telephone Encounter (Signed)
 Pharmacy Patient Advocate Encounter   Received notification from CoverMyMeds that prior authorization for VOQUEZNA  20MG  is required/requested.   Insurance verification completed.   The patient is insured through [facility] .   Per test claim: PA required; PA submitted to above mentioned insurance via CoverMyMeds Key/confirmation #/EOC [id number] Status is pending

## 2023-06-15 ENCOUNTER — Other Ambulatory Visit: Payer: Self-pay

## 2023-06-15 ENCOUNTER — Other Ambulatory Visit (HOSPITAL_COMMUNITY): Payer: Self-pay

## 2023-06-15 MED ORDER — VOQUEZNA 10 MG PO TABS
1.0000 | ORAL_TABLET | Freq: Every day | ORAL | 6 refills | Status: DC
  Filled 2023-06-15: qty 30, 30d supply, fill #0
  Filled 2023-09-04 – 2023-09-11 (×2): qty 30, 30d supply, fill #1
  Filled 2023-10-26 – 2023-10-29 (×3): qty 30, 30d supply, fill #2
  Filled 2023-12-24 – 2023-12-31 (×2): qty 30, 30d supply, fill #3

## 2023-06-15 NOTE — Telephone Encounter (Signed)
 Pharmacy Patient Advocate Encounter  Received notification from [facility] that Prior Authorization for MOTEGRITY  2MG  has been CANCELLED due to   PA #/Case ID/Reference #:  [id number]

## 2023-06-15 NOTE — Telephone Encounter (Signed)
 Pharmacy Patient Advocate Encounter  Received notification from [facility] that Prior Authorization for VOQUEZNA  20MG  has been DENIED.  Full denial letter will be uploaded to the media tab. See denial reason below.    PA #/Case ID/Reference #: [id number]  HOWEVER,

## 2023-06-15 NOTE — Telephone Encounter (Signed)
 New prescription for 10mg  Voquezna  sent in.

## 2023-06-17 DIAGNOSIS — Z20822 Contact with and (suspected) exposure to covid-19: Secondary | ICD-10-CM | POA: Diagnosis not present

## 2023-06-17 DIAGNOSIS — J101 Influenza due to other identified influenza virus with other respiratory manifestations: Secondary | ICD-10-CM | POA: Diagnosis not present

## 2023-06-17 DIAGNOSIS — R509 Fever, unspecified: Secondary | ICD-10-CM | POA: Diagnosis not present

## 2023-06-18 ENCOUNTER — Other Ambulatory Visit (HOSPITAL_COMMUNITY): Payer: Self-pay

## 2023-06-18 NOTE — Telephone Encounter (Signed)
 Called [facility].  The Voquezna  is about $127/mo. ThMotegrity  is $15 co pay for the generic so they are going to order it for her and let her know when its available.

## 2023-06-19 ENCOUNTER — Other Ambulatory Visit (HOSPITAL_COMMUNITY): Payer: Self-pay

## 2023-06-19 ENCOUNTER — Other Ambulatory Visit: Payer: Self-pay

## 2023-06-19 NOTE — Telephone Encounter (Signed)
Called [facility].  Voquezna 10 mg is ready for pick up and will cost patient $127 for a 30 day supply. She has Nurse, learning disability so she may be able to use a discount coupon card.  Coupon card for [employer] activated online and faxed to pharmacy

## 2023-06-20 ENCOUNTER — Other Ambulatory Visit (HOSPITAL_COMMUNITY): Payer: Self-pay

## 2023-06-20 NOTE — Telephone Encounter (Signed)
With the coupon codes patient will pay $25 a month for Voquezna 10 mg. MyChart message to patient

## 2023-06-25 ENCOUNTER — Other Ambulatory Visit (HOSPITAL_COMMUNITY): Payer: Self-pay

## 2023-06-28 ENCOUNTER — Ambulatory Visit: Payer: Commercial Managed Care - PPO | Admitting: Neurology

## 2023-07-04 ENCOUNTER — Other Ambulatory Visit (HOSPITAL_COMMUNITY): Payer: Self-pay

## 2023-07-16 ENCOUNTER — Other Ambulatory Visit: Payer: Self-pay

## 2023-07-25 ENCOUNTER — Emergency Department (HOSPITAL_BASED_OUTPATIENT_CLINIC_OR_DEPARTMENT_OTHER)

## 2023-07-25 ENCOUNTER — Other Ambulatory Visit: Payer: Self-pay

## 2023-07-25 ENCOUNTER — Emergency Department (HOSPITAL_BASED_OUTPATIENT_CLINIC_OR_DEPARTMENT_OTHER)
Admission: EM | Admit: 2023-07-25 | Discharge: 2023-07-25 | Disposition: A | Discharge status: Home / Self Care | Attending: Student | Admitting: Student

## 2023-07-25 ENCOUNTER — Telehealth: Payer: Self-pay | Admitting: Gastroenterology

## 2023-07-25 ENCOUNTER — Emergency Department (HOSPITAL_BASED_OUTPATIENT_CLINIC_OR_DEPARTMENT_OTHER): Admitting: Radiology

## 2023-07-25 ENCOUNTER — Encounter (HOSPITAL_BASED_OUTPATIENT_CLINIC_OR_DEPARTMENT_OTHER): Payer: Self-pay

## 2023-07-25 DIAGNOSIS — R109 Unspecified abdominal pain: Secondary | ICD-10-CM | POA: Diagnosis not present

## 2023-07-25 DIAGNOSIS — R030 Elevated blood-pressure reading, without diagnosis of hypertension: Secondary | ICD-10-CM | POA: Diagnosis not present

## 2023-07-25 DIAGNOSIS — R1084 Generalized abdominal pain: Secondary | ICD-10-CM | POA: Diagnosis not present

## 2023-07-25 DIAGNOSIS — I110 Hypertensive heart disease with heart failure: Secondary | ICD-10-CM | POA: Diagnosis not present

## 2023-07-25 DIAGNOSIS — N200 Calculus of kidney: Secondary | ICD-10-CM | POA: Diagnosis not present

## 2023-07-25 DIAGNOSIS — I509 Heart failure, unspecified: Secondary | ICD-10-CM | POA: Diagnosis not present

## 2023-07-25 DIAGNOSIS — I517 Cardiomegaly: Secondary | ICD-10-CM | POA: Diagnosis not present

## 2023-07-25 DIAGNOSIS — R0602 Shortness of breath: Secondary | ICD-10-CM | POA: Insufficient documentation

## 2023-07-25 DIAGNOSIS — E039 Hypothyroidism, unspecified: Secondary | ICD-10-CM | POA: Diagnosis not present

## 2023-07-25 DIAGNOSIS — Z79899 Other long term (current) drug therapy: Secondary | ICD-10-CM | POA: Diagnosis not present

## 2023-07-25 DIAGNOSIS — E876 Hypokalemia: Secondary | ICD-10-CM | POA: Diagnosis not present

## 2023-07-25 DIAGNOSIS — N3000 Acute cystitis without hematuria: Secondary | ICD-10-CM | POA: Insufficient documentation

## 2023-07-25 DIAGNOSIS — R1110 Vomiting, unspecified: Secondary | ICD-10-CM | POA: Diagnosis not present

## 2023-07-25 DIAGNOSIS — D251 Intramural leiomyoma of uterus: Secondary | ICD-10-CM | POA: Diagnosis not present

## 2023-07-25 LAB — URINALYSIS, ROUTINE W REFLEX MICROSCOPIC
Bilirubin Urine: NEGATIVE
Glucose, UA: NEGATIVE mg/dL
Hgb urine dipstick: NEGATIVE
Ketones, ur: NEGATIVE mg/dL
Nitrite: NEGATIVE
Protein, ur: 30 mg/dL — AB
Specific Gravity, Urine: 1.018 (ref 1.005–1.030)
WBC, UA: >50 WBC/hpf (ref 0–5)
pH: 7.5 (ref 5.0–8.0)

## 2023-07-25 LAB — COMPREHENSIVE METABOLIC PANEL
ALT: 10 U/L (ref 0–44)
AST: 20 U/L (ref 15–41)
Albumin: 4.7 g/dL (ref 3.5–5.0)
Alkaline Phosphatase: 88 U/L (ref 38–126)
Anion gap: 13 (ref 5–15)
BUN: 8 mg/dL (ref 6–20)
CO2: 28 mmol/L (ref 22–32)
Calcium: 9.8 mg/dL (ref 8.9–10.3)
Chloride: 101 mmol/L (ref 98–111)
Creatinine, Ser: 0.51 mg/dL (ref 0.44–1.00)
GFR, Estimated: >60 mL/min (ref >60)
Glucose, Bld: 101 mg/dL — ABNORMAL HIGH (ref 70–99)
Potassium: 2.6 mmol/L — CL (ref 3.5–5.1)
Sodium: 142 mmol/L (ref 135–145)
Total Bilirubin: 0.8 mg/dL (ref 0.0–1.2)
Total Protein: 7.9 g/dL (ref 6.5–8.1)

## 2023-07-25 LAB — BASIC METABOLIC PANEL
Anion gap: 10 (ref 5–15)
BUN: 7 mg/dL (ref 6–20)
CO2: 28 mmol/L (ref 22–32)
Calcium: 9.5 mg/dL (ref 8.9–10.3)
Chloride: 103 mmol/L (ref 98–111)
Creatinine, Ser: 0.49 mg/dL (ref 0.44–1.00)
GFR, Estimated: >60 mL/min (ref >60)
Glucose, Bld: 101 mg/dL — ABNORMAL HIGH (ref 70–99)
Potassium: 3.3 mmol/L — ABNORMAL LOW (ref 3.5–5.1)
Sodium: 141 mmol/L (ref 135–145)

## 2023-07-25 LAB — CBC
HCT: 37.2 % (ref 36.0–46.0)
Hemoglobin: 11.8 g/dL — ABNORMAL LOW (ref 12.0–15.0)
MCH: 27.3 pg (ref 26.0–34.0)
MCHC: 31.7 g/dL (ref 30.0–36.0)
MCV: 85.9 fL (ref 80.0–100.0)
Platelets: 192 10*3/uL (ref 150–400)
RBC: 4.33 MIL/uL (ref 3.87–5.11)
RDW: 15 % (ref 11.5–15.5)
WBC: 9.7 10*3/uL (ref 4.0–10.5)
nRBC: 0 % (ref 0.0–0.2)

## 2023-07-25 LAB — RESP PANEL BY RT-PCR (RSV, FLU A&B, COVID)  RVPGX2
Influenza A by PCR: NEGATIVE
Influenza B by PCR: NEGATIVE
Resp Syncytial Virus by PCR: NEGATIVE
SARS Coronavirus 2 by RT PCR: NEGATIVE

## 2023-07-25 LAB — MAGNESIUM
Magnesium: 0.9 mg/dL — CL (ref 1.7–2.4)
Magnesium: 2.6 mg/dL — ABNORMAL HIGH (ref 1.7–2.4)

## 2023-07-25 LAB — BRAIN NATRIURETIC PEPTIDE: B Natriuretic Peptide: 196.8 pg/mL — ABNORMAL HIGH (ref 0.0–100.0)

## 2023-07-25 LAB — LIPASE, BLOOD: Lipase: <10 U/L — ABNORMAL LOW (ref 11–51)

## 2023-07-25 MED ORDER — POTASSIUM CHLORIDE 10 MEQ/100ML IV SOLN
10.0000 meq | Freq: Once | INTRAVENOUS | Status: AC
  Administered 2023-07-25: 10 meq via INTRAVENOUS
  Filled 2023-07-25: qty 100

## 2023-07-25 MED ORDER — POTASSIUM CHLORIDE CRYS ER 20 MEQ PO TBCR: 40.0000 meq | EXTENDED_RELEASE_TABLET | Freq: Once | ORAL | Status: DC

## 2023-07-25 MED ORDER — IOHEXOL 300 MG/ML  SOLN
100.0000 mL | Freq: Once | INTRAMUSCULAR | Status: AC | PRN
  Administered 2023-07-25: 85 mL via INTRAVENOUS

## 2023-07-25 MED ORDER — SODIUM CHLORIDE 0.9 % IV SOLN
1.0000 g | Freq: Once | INTRAVENOUS | Status: AC
  Administered 2023-07-25: 1 g via INTRAVENOUS
  Filled 2023-07-25: qty 10

## 2023-07-25 MED ORDER — ALUM & MAG HYDROXIDE-SIMETH 200-200-20 MG/5ML PO SUSP
30.0000 mL | Freq: Once | ORAL | Status: AC
  Administered 2023-07-25: 30 mL via ORAL
  Filled 2023-07-25: qty 30

## 2023-07-25 MED ORDER — KETOROLAC TROMETHAMINE 15 MG/ML IJ SOLN
15.0000 mg | Freq: Once | INTRAMUSCULAR | Status: AC
  Administered 2023-07-25: 15 mg via INTRAVENOUS
  Filled 2023-07-25: qty 1

## 2023-07-25 MED ORDER — ONDANSETRON HCL 4 MG/2ML IJ SOLN
4.0000 mg | Freq: Once | INTRAMUSCULAR | Status: AC
  Administered 2023-07-25: 4 mg via INTRAVENOUS
  Filled 2023-07-25: qty 2

## 2023-07-25 MED ORDER — CEPHALEXIN 500 MG PO CAPS: 500.0000 mg | ORAL_CAPSULE | Freq: Four times a day (QID) | ORAL | 0 refills | Status: DC

## 2023-07-25 MED ORDER — BISOPROLOL FUMARATE 5 MG PO TABS: 5.0000 mg | ORAL_TABLET | Freq: Once | ORAL | Status: DC

## 2023-07-25 MED ORDER — POTASSIUM CHLORIDE 20 MEQ PO PACK
40.0000 meq | PACK | Freq: Every day | ORAL | Status: DC
  Administered 2023-07-25: 40 meq via ORAL
  Filled 2023-07-25: qty 2

## 2023-07-25 MED ORDER — HYDRALAZINE HCL 20 MG/ML IJ SOLN
10.0000 mg | Freq: Once | INTRAMUSCULAR | Status: AC
  Administered 2023-07-25: 10 mg via INTRAVENOUS
  Filled 2023-07-25: qty 1

## 2023-07-25 MED ORDER — MAGNESIUM SULFATE 4 GM/100ML IV SOLN
4.0000 g | Freq: Once | INTRAVENOUS | Status: AC
  Administered 2023-07-25: 4 g via INTRAVENOUS
  Filled 2023-07-25: qty 100

## 2023-07-25 MED ORDER — CEPHALEXIN 250 MG/5ML PO SUSR: 500.0000 mg | Freq: Four times a day (QID) | ORAL | 0 refills | Status: AC

## 2023-07-25 MED ORDER — ACETAMINOPHEN 500 MG PO TABS
1000.0000 mg | ORAL_TABLET | Freq: Once | ORAL | Status: AC
  Administered 2023-07-25: 1000 mg via ORAL
  Filled 2023-07-25: qty 2

## 2023-07-25 NOTE — Telephone Encounter (Signed)
 Procedure:Endoscopy Procedure date: 08/02/23 Procedure location: [facility] Arrival Time: 9:30 am Spoke with the patient Y/N: No, I left a detailed message for the patient to return call 07/25/23 @ 4:42 pm  Any prep concerns? No  Has the patient obtained the prep from the pharmacy ? No prep needed Do you have a care partner and transportation: ___ Any additional concerns? ___

## 2023-07-25 NOTE — ED Notes (Signed)
 Given crackers and juice for PO challenge  Tolerated well

## 2023-07-25 NOTE — ED Provider Notes (Signed)
 [place] EMERGENCY DEPARTMENT AT [facility] Provider Note   CSN: [id number] Arrival date & time: 07/25/23  1318     History  Chief Complaint  Patient presents with   Hypertension   Abdominal Pain    Karen [name] is a 53 y.o. female with history of interstitial lung disease, scleroderma, gastroparesis, sjrogens who presents with abdominal pain, shortness of breath and headache that started earlier today.  Patient endorses nausea without emesis.  Denies persistent diarrhea.  Endorses cough but feels that it is improving since having the flu last month.  She has known interstitial lung disease.  States that this feels different.  Denies any chest pain.  Abdominal pain is generalized.  No change in bowel movements.  Denies any blurry vision, extremity numbness or persistent dizziness.  Came on gradually and has just worsened.  Noted to be hypertensive during triage, states she did not take her blood pressure medication this morning   Hypertension Associated symptoms include abdominal pain.  Abdominal Pain  Past Medical History:  Diagnosis Date   Acute respiratory failure with hypoxia due to flash pulmonary edema requiring intubation 02/13/2020   Calculus of gallbladder with acute cholecystitis without obstruction    CHF (congestive heart failure) (HCC)    Gastroparesis    GERD (gastroesophageal reflux disease)    HTN (hypertension) 03/23/2017   Hypothyroidism    ILD (interstitial lung disease) (HCC) 02/08/2018   Multinodular thyroid    benign FNA 08/2017.   Perimenopause 02/27/2019   Pulmonary edema    Pulmonary hypertension (HCC) 08/04/2021   Raynaud's disease 11/10/2021   Scleroderma (HCC)    Sjogren's syndrome (HCC) 08/30/2017   Status post laparoscopic cholecystectomy 02/13/20 02/13/2020   Vitamin D deficiency disease 02/27/2019       Home Medications Prior to Admission medications   Medication Sig Start Date End Date Taking? Authorizing Provider   cephALEXin (KEFLEX) 500 MG capsule Take 1 capsule (500 mg total) by mouth 4 (four) times daily. 07/25/23  Yes Halford Decamp, PA-C  acetaminophen (TYLENOL) 325 MG tablet Take 2 tablets (650 mg total) by mouth every 6 (six) hours as needed for mild pain, fever or headache (or Fever >/= 101). Patient taking differently: Take 325-650 mg by mouth as needed for mild pain (pain score 1-3), fever or headache (or Fever >/= 101). 02/15/20   Shon Hale, MD  bisoprolol (ZEBETA) 5 MG tablet Take 1 tablet (5 mg total) by mouth daily. 08/24/22   Bensimhon, Bevelyn Buckles, MD  furosemide (LASIX) 40 MG tablet Take 1 tablet (40 mg total) by mouth 3 (three) times a week. Mon, Wed, Fri Patient taking differently: Take 40-80 mg by mouth daily as needed for fluid. 08/25/22   Bensimhon, Bevelyn Buckles, MD  macitentan (OPSUMIT) 10 MG tablet Take 1 tablet (10 mg total) by mouth daily. 03/24/22   Bensimhon, Bevelyn Buckles, MD  mycophenolate (CELLCEPT) 200 MG/ML suspension Take 7.5 mLs (1,500 mg total) by mouth 2 (two) times daily. 04/23/23   Mannam, Colbert Coyer, MD  Nintedanib (OFEV) 150 MG CAPS Take 1 capsule (150 mg total) by mouth 2 (two) times daily. 08/28/22   Quentin Angst, MD  NP THYROID 120 MG tablet Take 1 tablet (120 mg total) by mouth daily before breakfast. 03/01/23   Anabel Halon, MD  potassium chloride 20 MEQ/15ML (10%) SOLN Take 15 mLs (20 mEq total) by mouth daily with furosemide Patient taking differently: Take 20 mEq by mouth daily as needed (Lasix use). 08/17/22   Allena Katz,  Earlie Lou, MD  predniSONE (DELTASONE) 10 MG tablet Take 4 tablets (40 mg total) by mouth daily with breakfast. 04/20/23 04/19/24  Mannam, Colbert Coyer, MD  Prucalopride Succinate (MOTEGRITY) 2 MG TABS Take 1 tablet (2 mg total) by mouth daily. 05/16/23   Armbruster, Willaim Rayas, MD  spironolactone (ALDACTONE) 25 MG tablet Take 1 tablet (25 mg total) by mouth 2 (two) times daily. Patient taking differently: Take 50 mg by mouth daily. 12/21/22   Anabel Halon, MD  sulfamethoxazole-trimethoprim (BACTRIM DS) 800-160 MG tablet Take 1 tablet by mouth 3 (three) times a week. 04/20/23 04/19/24  Chilton Greathouse, MD  Vonoprazan Fumarate (VOQUEZNA) 10 MG TABS Take 1 tablet by mouth daily. 06/15/23   Armbruster, Willaim Rayas, MD  VOQUEZNA 20 MG TABS Take 20 mg by mouth daily. Lot: [id number], exp: 12-2023 05/16/23   Armbruster, Willaim Rayas, MD  PARoxetine (PAXIL) 10 MG tablet Take 1 tablet (10 mg total) by mouth daily as directed 10/07/20 12/02/20        Allergies    Lisinopril and Tocilizumab    Review of Systems   Review of Systems  Gastrointestinal:  Positive for abdominal pain.    Physical Exam Updated Vital Signs BP (!) 154/97   Pulse (!) 101   Temp 97.7 F (36.5 C) (Oral)   Resp 17   Ht 5\' 5"  (1.651 m)   Wt 59 kg   SpO2 94%   BMI 21.63 kg/m  Physical Exam Vitals and nursing note reviewed.  Constitutional:      General: She is not in acute distress.    Appearance: She is well-developed.  HENT:     Head: Normocephalic and atraumatic.  Eyes:     Conjunctiva/sclera: Conjunctivae normal.  Cardiovascular:     Rate and Rhythm: Normal rate and regular rhythm.     Heart sounds: No murmur heard. Pulmonary:     Effort: Pulmonary effort is normal. No respiratory distress.     Breath sounds: Normal breath sounds. No wheezing or rales.  Abdominal:     Palpations: Abdomen is soft.     Comments: Very mild generalized tenderness without rebound or guarding, no peritoneal signs  Musculoskeletal:        General: No swelling.     Cervical back: Neck supple.  Skin:    General: Skin is warm and dry.     Capillary Refill: Capillary refill takes less than 2 seconds.  Neurological:     Mental Status: She is alert.     Comments: Patient is alert and oriented. There is no abnormal phonation. Symmetric smile without facial droop.Moves all extremities spontaneously. 5/5 strength in upper and lower extremities. . No sensation deficit. There is no nystagmus. EOMI,  PERRL. Coordination intact with finger to nose.    Psychiatric:        Mood and Affect: Mood normal.     ED Results / Procedures / Treatments   Labs (all labs ordered are listed, but only abnormal results are displayed) Labs Reviewed  LIPASE, BLOOD - Abnormal; Notable for the following components:      Result Value   Lipase <10 (*)    All other components within normal limits  COMPREHENSIVE METABOLIC PANEL - Abnormal; Notable for the following components:   Potassium 2.6 (*)    Glucose, Bld 101 (*)    All other components within normal limits  CBC - Abnormal; Notable for the following components:   Hemoglobin 11.8 (*)    All other components within  normal limits  URINALYSIS, ROUTINE W REFLEX MICROSCOPIC - Abnormal; Notable for the following components:   Color, Urine COLORLESS (*)    Protein, ur 30 (*)    Leukocytes,Ua LARGE (*)    Bacteria, UA RARE (*)    All other components within normal limits  BRAIN NATRIURETIC PEPTIDE - Abnormal; Notable for the following components:   B Natriuretic Peptide 196.8 (*)    All other components within normal limits  MAGNESIUM - Abnormal; Notable for the following components:   Magnesium 0.9 (*)    All other components within normal limits  BASIC METABOLIC PANEL - Abnormal; Notable for the following components:   Potassium 3.3 (*)    Glucose, Bld 101 (*)    All other components within normal limits  MAGNESIUM - Abnormal; Notable for the following components:   Magnesium 2.6 (*)    All other components within normal limits  RESP PANEL BY RT-PCR (RSV, FLU A&B, COVID)  [id number]    EKG EKG Interpretation Date/Time:  Wednesday July 25 2023 13:52:59 EDT Ventricular Rate:  85 PR Interval:  170 QRS Duration:  85 QT Interval:  372 QTC Calculation: 443 R Axis:   -42  Text Interpretation: Sinus rhythm Left anterior fascicular block Consider left ventricular hypertrophy Nonspecific T abnormalities, anterior leads No significant change since  last tracing Confirmed by Lorre Nick ([id number]) on 07/25/2023 3:59:25 PM  Radiology CT ABDOMEN PELVIS W CONTRAST Result Date: 07/25/2023 CLINICAL DATA:  Acute abdominal pain and nausea for 3 hours. EXAM: CT ABDOMEN AND PELVIS WITH CONTRAST TECHNIQUE: Multidetector CT imaging of the abdomen and pelvis was performed using the standard protocol following bolus administration of intravenous contrast. RADIATION DOSE REDUCTION: This exam was performed according to the departmental dose-optimization program which includes automated exposure control, adjustment of the mA and/or kV according to patient size and/or use of iterative reconstruction technique. CONTRAST:  85mL OMNIPAQUE IOHEXOL 300 MG/ML  SOLN COMPARISON:  02/03/2023 FINDINGS: Lower Chest: Chronic bibasilar pulmonary fibrosis and traction bronchiectasis again noted. Hepatobiliary: No suspicious hepatic masses identified. Prior cholecystectomy. No evidence of biliary obstruction. Pancreas:  No mass or inflammatory changes. Spleen: Within normal limits in size and appearance. Adrenals/Urinary Tract: No suspicious masses identified. Tiny less than 5 mm renal calculi are seen bilaterally. No evidence of ureteral calculi or hydronephrosis. Unremarkable unopacified urinary bladder. Stomach/Bowel: No evidence of obstruction, inflammatory process or abnormal fluid collections. Normal appendix visualized. Vascular/Lymphatic: No pathologically enlarged lymph nodes. No acute vascular findings. Reproductive: A dominant intramural fibroid is central uterus measuring 6.8 cm. Multiple smaller subserosal fibroids also show no significant change, largest arising from the right posterior corpus measuring 4.3 cm. No inflammatory process identified. Tiny amount of free fluid noted pelvic cul-de-sac. Other:  None. Musculoskeletal:  No suspicious bone lesions identified. IMPRESSION: No acute findings within the abdomen or pelvis. Tiny bilateral renal calculi. No evidence of  ureteral calculi or hydronephrosis. No significant change in multiple uterine fibroids, largest measuring 6.8 cm. Chronic bibasilar pulmonary fibrosis and traction bronchiectasis. Electronically Signed   By: Danae Orleans M.D.   On: 07/25/2023 17:27   DG Chest 2 View Result Date: 07/25/2023 CLINICAL DATA:  Shortness of breath. Abdominal pain, vomiting, and diarrhea. EXAM: CHEST - 2 VIEW COMPARISON:  04/16/2023 FINDINGS: Stable cardiomegaly. Low lung volumes and diffuse interstitial prominence shows no significant change. No evidence of focal consolidation or pleural effusion. IMPRESSION: Stable low lung volumes and diffuse interstitial prominence. No acute findings. Electronically Signed   By: Dietrich Pates.D.  On: 07/25/2023 16:49    Procedures Procedures    Medications Ordered in ED Medications  potassium chloride (KLOR-CON) packet 40 mEq (40 mEq Oral Given 07/25/23 1530)  ketorolac (TORADOL) 15 MG/ML injection 15 mg (15 mg Intravenous Given 07/25/23 1450)  ondansetron (ZOFRAN) injection 4 mg (4 mg Intravenous Given 07/25/23 1451)  alum & mag hydroxide-simeth (MAALOX/MYLANTA) 200-200-20 MG/5ML suspension 30 mL (30 mLs Oral Given 07/25/23 1453)  potassium chloride 10 mEq in 100 mL IVPB (0 mEq Intravenous Stopped 07/25/23 1715)  magnesium sulfate IVPB 4 g 100 mL (0 g Intravenous Stopped 07/25/23 1812)  iohexol (OMNIPAQUE) 300 MG/ML solution 100 mL (85 mLs Intravenous Contrast Given 07/25/23 1554)  acetaminophen (TYLENOL) tablet 1,000 mg (1,000 mg Oral Given 07/25/23 1707)  hydrALAZINE (APRESOLINE) injection 10 mg (10 mg Intravenous Given 07/25/23 1810)  cefTRIAXone (ROCEPHIN) 1 g in sodium chloride 0.9 % 100 mL IVPB (0 g Intravenous Stopped 07/25/23 1919)  ketorolac (TORADOL) 15 MG/ML injection 15 mg (15 mg Intravenous Given 07/25/23 2023)    ED Course/ Medical Decision Making/ A&P                                 Medical Decision Making Amount and/or Complexity of Data Reviewed Labs:  ordered. Radiology: ordered.  Risk OTC drugs. Prescription drug management.   This patient presents to the ED with chief complaint(s) of headache, abdominal pain and shortness of breath.  The complaint involves an extensive differential diagnosis and also carries with it a high risk of complications and morbidity.   pertinent past medical history as listed in HPI  The differential diagnosis includes  CVA, TIA, subarachnoid hemorrhage, symptomatic hypertension, aortic dissection, cholecystitis, appendicitis, gastroenteritis, URI, pneumonia, ACS, PE The initial plan is to  Start with basic labs, EKG Additional history obtained: Records reviewed Care Everywhere/External Records  Initial Assessment:   Patient presents hypertensive 200/126 with complaints of abdominal pain, shortness of breath and and headache that started earlier today.  On exam she has no neurodeficits.  Headache was not sudden or maximal in onset.  No vision changes or sensation deficits reported.  Lower suspicion for CVA, TIA, subarachnoid hemorrhage.  No evidence of abdominal aorta widening/ dissection on POCUS.  Low suspicion for dissection.  She has no focal tenderness to McBurney's point, negative Murphy sign.  Lower suspicion for appendicitis or cholecystitis.  Lung sounds are clear she is afebrile, low suspicion for pneumonia.  She has no chest pain to suggest ACS or PE.  Noted to be hypertensive in triage, she had forgotten to take her blood pressure medication today, she states she is typically on bisoprolol and spironolactone for hypertension.  Upon chart review it appears she has had multiple visits with similar complaints.  In 9/24 in 6/24 but found to have a partial SBO.  She states his symptoms feel distinctly different.  Independent ECG interpretation:  Sinus rhythm with inferior T wave abnormalities unchanged from prior EKG  Independent labs interpretation:  The following labs were independently interpreted:   CBC without significant abnormality, CMP notable for potassium of 2.6, Mg 0.9, BNP 196.8, respiratory panel negative, lipase less than 10  Independent visualization and interpretation of imaging: I independently visualized the following imaging with scope of interpretation limited to determining acute life threatening conditions related to emergency care:  X-ray, which revealed diffuse interstitial prominence with no acute findings CT abdomen pelvis no acute abnormality  Treatment and Reassessment: Patient given  GI cocktail plus Toradol and Zofran following first assessment  Noted to be hypokalemic of 2.6, given potassium supplementation p.o. and IV.  Patient reports she has only had mild diarrhea today.  She is typically compliant with her spironolactone and only takes Lasix as needed.  Noted to magnesium 0.9 given, given mag sulfate over 2 hours.  Hold off on giving spironolactone.  Do not have bisprolol, her other home BP med.  Give hydralazine 10  Recurrence of headache, patient given Tylenol 1000 mg  Noted to have large leuks, WBC >50 but asymptomatic.  Will treat empirically  On reassessment patient's symptoms have improved.  She is tolerating p.o.  Her Mg and K have improved as well.  Discussed discharge plan.  She is agreeable.  Consultations obtained:   none  Disposition:   Patient be discharged home.  She has potassium supplementation at home that she will utilize.  Prescription for Keflex sent in for UTI.  The patient has been appropriately medically screened and/or stabilized in the ED. I have low suspicion for any other emergent medical condition which would require further screening, evaluation or treatment in the ED or require inpatient management. At time of discharge the patient is hemodynamically stable and in no acute distress. I have discussed work-up results and diagnosis with patient and answered all questions. Patient is agreeable with discharge plan. We discussed  strict return precautions for returning to the emergency department and they verbalized understanding.     Social Determinants of Health:   none  This note was dictated with voice recognition software.  Despite best efforts at proofreading, errors may have occurred which can change the documentation meaning.          Final Clinical Impression(s) / ED Diagnoses Final diagnoses:  Hypomagnesemia  Hypokalemia  Elevated blood pressure reading  Generalized abdominal pain  Shortness of breath  Acute cystitis without hematuria    Rx / DC Orders ED Discharge Orders          Ordered    cephALEXin (KEFLEX) 500 MG capsule  4 times daily        07/25/23 2022              Fabienne Bruns 07/25/23 2038    Glendora Score, MD 07/27/23 1221

## 2023-07-25 NOTE — ED Triage Notes (Addendum)
 Pt to ED from work c/o abdominal pain and HA X 3 hours. Reports nausea, denies Vomiting diarrhea. Generalized abdominal pain. Pt noted to be HTN. Reports hx but not taking medication

## 2023-07-25 NOTE — ED Provider Notes (Signed)
 I provided a substantive portion of the care of this patient.  I personally made/approved the management plan for this patient and take responsibility for the patient management.  EKG Interpretation Date/Time:  Wednesday July 25 2023 13:52:59 EDT Ventricular Rate:  85 PR Interval:  170 QRS Duration:  85 QT Interval:  372 QTC Calculation: 443 R Axis:   -42  Text Interpretation: Sinus rhythm Left anterior fascicular block Consider left ventricular hypertrophy Nonspecific T abnormalities, anterior leads No significant change since last tracing Confirmed by Lorre Nick ([id number]) on 07/25/2023 3:59:25 PM   Patient's EKG shows normal sinus rhythm.  Unchanged from prior studies.  This is a 53 year old female who presents with abdominal pain along with nausea vomiting.  History of SBO.  Patient lab significant for hypokalemia and hypomagnesemia.  Receiving potassium and magnesium.  Abdominal CT pending at this time.  Will reassess.   Lorre Nick, MD 07/25/23 1600

## 2023-07-25 NOTE — Discharge Instructions (Addendum)
 You were evaluated in the emergency room for headache, abdominal pain, shortness of breath and nausea.  During your visit you were noted to have low potassium and low magnesium.  These were replenished.  Please take your potassium supplementation over the next couple days.  And please follow-up with your primary care doctor within the next several days to ensure your symptoms symptoms are improving, your electrolytes remained in normal limits and your blood pressure is well-controlled.  You were noted to have a urinary tract infection.  You were given an antibiotic while you were here and a prescription for antibiotics was sent into pharmacy.

## 2023-07-26 ENCOUNTER — Encounter (HOSPITAL_COMMUNITY): Payer: Self-pay | Admitting: Gastroenterology

## 2023-07-26 NOTE — Progress Notes (Signed)
 Attempted to obtain medical history for pre op call via telephone, unable to reach at this time. HIPAA compliant voicemail message left requesting return call to pre surgical testing department.

## 2023-07-26 NOTE — Telephone Encounter (Signed)
 Called pt.  Relayed results and recommendations.  Patient expressed understanding. We will touch base again next week once she sees how she is feeling

## 2023-07-26 NOTE — Telephone Encounter (Signed)
 Jan her case was scheduled at the hospital for her underlying respiratory issues. If she feels she is not in a place where she is comfortable to do it we can hold off, but let's see how she feels in the upcoming days. She should call her pulmonologist if her breathing has been bothering her.  The EGD was going to evaluate some of these other complaints of her upper tract. If she feels the motegrity is causing her to feel sick she can stop it and see if she feels any better. Can you please let her know my recommendations, we should touch base Monday to see how she is doing but please ask her to contact her PCP or pulmonary provider in the interim. Thanks

## 2023-08-02 ENCOUNTER — Ambulatory Visit (HOSPITAL_COMMUNITY)
Admission: RE | Admit: 2023-08-02 | Discharge: 2023-08-02 | Disposition: A | Discharge status: Home / Self Care | Payer: Commercial Managed Care - PPO | Attending: Gastroenterology | Admitting: Gastroenterology

## 2023-08-02 ENCOUNTER — Ambulatory Visit (HOSPITAL_COMMUNITY): Payer: Self-pay | Admitting: Anesthesiology

## 2023-08-02 ENCOUNTER — Encounter (HOSPITAL_COMMUNITY)
Admission: RE | Disposition: A | Discharge status: Home / Self Care | Payer: Self-pay | Source: Home / Self Care | Attending: Gastroenterology

## 2023-08-02 ENCOUNTER — Ambulatory Visit: Payer: Commercial Managed Care - PPO | Admitting: Internal Medicine

## 2023-08-02 DIAGNOSIS — R1310 Dysphagia, unspecified: Secondary | ICD-10-CM | POA: Insufficient documentation

## 2023-08-02 DIAGNOSIS — W44F3XA Food entering into or through a natural orifice, initial encounter: Secondary | ICD-10-CM | POA: Diagnosis not present

## 2023-08-02 DIAGNOSIS — K2100 Gastro-esophageal reflux disease with esophagitis, without bleeding: Secondary | ICD-10-CM | POA: Insufficient documentation

## 2023-08-02 DIAGNOSIS — I509 Heart failure, unspecified: Secondary | ICD-10-CM | POA: Diagnosis not present

## 2023-08-02 DIAGNOSIS — I110 Hypertensive heart disease with heart failure: Secondary | ICD-10-CM | POA: Diagnosis not present

## 2023-08-02 DIAGNOSIS — K3184 Gastroparesis: Secondary | ICD-10-CM

## 2023-08-02 DIAGNOSIS — K2950 Unspecified chronic gastritis without bleeding: Secondary | ICD-10-CM | POA: Insufficient documentation

## 2023-08-02 DIAGNOSIS — T18118A Gastric contents in esophagus causing other injury, initial encounter: Secondary | ICD-10-CM | POA: Diagnosis not present

## 2023-08-02 DIAGNOSIS — T182XXA Foreign body in stomach, initial encounter: Secondary | ICD-10-CM | POA: Diagnosis not present

## 2023-08-02 DIAGNOSIS — T18128A Food in esophagus causing other injury, initial encounter: Secondary | ICD-10-CM | POA: Diagnosis not present

## 2023-08-02 DIAGNOSIS — Z87891 Personal history of nicotine dependence: Secondary | ICD-10-CM | POA: Insufficient documentation

## 2023-08-02 DIAGNOSIS — Z79890 Hormone replacement therapy (postmenopausal): Secondary | ICD-10-CM | POA: Diagnosis not present

## 2023-08-02 DIAGNOSIS — K449 Diaphragmatic hernia without obstruction or gangrene: Secondary | ICD-10-CM | POA: Insufficient documentation

## 2023-08-02 DIAGNOSIS — K219 Gastro-esophageal reflux disease without esophagitis: Secondary | ICD-10-CM

## 2023-08-02 DIAGNOSIS — E039 Hypothyroidism, unspecified: Secondary | ICD-10-CM | POA: Insufficient documentation

## 2023-08-02 DIAGNOSIS — I5032 Chronic diastolic (congestive) heart failure: Secondary | ICD-10-CM | POA: Diagnosis not present

## 2023-08-02 HISTORY — PX: BIOPSY OF SKIN SUBCUTANEOUS TISSUE AND/OR MUCOUS MEMBRANE: SHX6741

## 2023-08-02 HISTORY — PX: ESOPHAGOGASTRODUODENOSCOPY (EGD) WITH PROPOFOL: SHX5813

## 2023-08-02 SURGERY — ESOPHAGOGASTRODUODENOSCOPY (EGD) WITH PROPOFOL
Anesthesia: Monitor Anesthesia Care

## 2023-08-02 MED ORDER — SODIUM CHLORIDE 0.9 % IV SOLN
INTRAVENOUS | Status: AC | PRN
  Administered 2023-08-02: 500 mL via INTRAMUSCULAR

## 2023-08-02 MED ORDER — SODIUM CHLORIDE 0.9 % IV SOLN
INTRAVENOUS | Status: DC
Start: 2023-08-02 — End: 2023-08-02

## 2023-08-02 MED ORDER — LABETALOL HCL 5 MG/ML IV SOLN
INTRAVENOUS | Status: AC
  Filled 2023-08-02: qty 4

## 2023-08-02 MED ORDER — LABETALOL HCL 5 MG/ML IV SOLN
10.0000 mg | Freq: Once | INTRAVENOUS | Status: AC
  Administered 2023-08-02: 10 mg via INTRAVENOUS

## 2023-08-02 MED ORDER — PROPOFOL 500 MG/50ML IV EMUL
INTRAVENOUS | Status: DC | PRN
  Administered 2023-08-02: 200 ug/kg/min via INTRAVENOUS
  Administered 2023-08-02: 40 mg via INTRAVENOUS
  Administered 2023-08-02: 50 mg via INTRAVENOUS

## 2023-08-02 MED ORDER — LABETALOL HCL 5 MG/ML IV SOLN
10.0000 mg | Freq: Once | INTRAVENOUS | Status: AC
  Administered 2023-08-02: 10 mg via INTRAVENOUS
  Filled 2023-08-02: qty 4

## 2023-08-02 SURGICAL SUPPLY — 14 items

## 2023-08-02 NOTE — Anesthesia Preprocedure Evaluation (Signed)
 Anesthesia Evaluation  Patient identified by MRN, date of birth, ID band Patient awake    Reviewed: Allergy & Precautions, H&P , NPO status , Patient's Chart, lab work & pertinent test results  Airway Mallampati: II   Neck ROM: full    Dental   Pulmonary neg pulmonary ROS   breath sounds clear to auscultation       Cardiovascular hypertension, +CHF   Rhythm:regular Rate:Normal     Neuro/Psych    GI/Hepatic ,GERD  ,,  Endo/Other  Hypothyroidism    Renal/GU      Musculoskeletal   Abdominal   Peds  Hematology   Anesthesia Other Findings   Reproductive/Obstetrics                             Anesthesia Physical Anesthesia Plan  ASA: 2  Anesthesia Plan: MAC   Post-op Pain Management:    Induction: Intravenous  PONV Risk Score and Plan: 2 and Propofol infusion and Treatment may vary due to age or medical condition  Airway Management Planned: Nasal Cannula  Additional Equipment:   Intra-op Plan:   Post-operative Plan:   Informed Consent: I have reviewed the patients History and Physical, chart, labs and discussed the procedure including the risks, benefits and alternatives for the proposed anesthesia with the patient or authorized representative who has indicated his/her understanding and acceptance.     Dental advisory given  Plan Discussed with: CRNA, Anesthesiologist and Surgeon  Anesthesia Plan Comments:        Anesthesia Quick Evaluation

## 2023-08-02 NOTE — Discharge Instructions (Signed)
 YOU HAD AN ENDOSCOPIC PROCEDURE TODAY: Refer to the procedure report and other information in the discharge instructions given to you for any specific questions about what was found during the examination. If this information does not answer your questions, please call [place] office at [phone] to clarify.   YOU SHOULD EXPECT: Some feelings of bloating in the abdomen. Passage of more gas than usual. Walking can help get rid of the air that was put into your GI tract during the procedure and reduce the bloating. If you had a lower endoscopy (such as a colonoscopy or flexible sigmoidoscopy) you may notice spotting of blood in your stool or on the toilet paper. Some abdominal soreness may be present for a day or two, also.  DIET: Your first meal following the procedure should be a light meal and then it is ok to progress to your normal diet. A half-sandwich or bowl of soup is an example of a good first meal. Heavy or fried foods are harder to digest and may make you feel nauseous or bloated. Drink plenty of fluids but you should avoid alcoholic beverages for 24 hours. If you had a esophageal dilation, please see attached instructions for diet.    ACTIVITY: Your care partner should take you home directly after the procedure. You should plan to take it easy, moving slowly for the rest of the day. You can resume normal activity the day after the procedure however YOU SHOULD NOT DRIVE, use power tools, machinery or perform tasks that involve climbing or major physical exertion for 24 hours (because of the sedation medicines used during the test).   SYMPTOMS TO REPORT IMMEDIATELY: A gastroenterologist can be reached at any hour. Please call [phone]  for any of the following symptoms:  Upper endoscopy (EGD, EUS, ERCP, esophageal dilation) Vomiting of blood or coffee ground material  New, significant abdominal pain  New, significant chest pain or pain under the shoulder blades  Painful or persistently  difficult swallowing  New shortness of breath  Black, tarry-looking or red, bloody stools  FOLLOW UP:  If any biopsies were taken you will be contacted by phone or by letter within the next 1-3 weeks. Call [phone]  if you have not heard about the biopsies in 3 weeks.  Please also call with any specific questions about appointments or follow up tests.

## 2023-08-02 NOTE — Transfer of Care (Signed)
 Immediate Anesthesia Transfer of Care Note  Patient: Karen [name]  Procedure(s) Performed: ESOPHAGOGASTRODUODENOSCOPY (EGD) WITH PROPOFOL BIOPSY, SKIN, SUBCUTANEOUS TISSUE, OR MUCOUS MEMBRANE  Patient Location: PACU  Anesthesia Type:MAC  Level of Consciousness: sedated  Airway & Oxygen Therapy: Patient Spontanous Breathing  Post-op Assessment: Report given to RN  Post vital signs: Reviewed and stable  Last Vitals:  Vitals Value Taken Time  BP 97/71 08/02/23 1020  Temp    Pulse 94 08/02/23 1020  Resp 27 08/02/23 1020  SpO2 99 % 08/02/23 1020  Vitals shown include unfiled device data.  Last Pain:  Vitals:   08/02/23 1017  TempSrc:   PainSc: 0-No pain         Complications: No notable events documented.

## 2023-08-02 NOTE — H&P (Signed)
 [facility] History and Physical   Primary Care Physician:  Anabel Halon, MD   Reason for Procedure:   GERD, gastroparesis, dysphagia  Plan:    EGD with possible dilation     HPI: Karen [name] is a 53 y.o. female  here for EGD to reassess GERD, dysphagia, gastroparesis / nausea / vomiting. Patient has a history of scleroderma and gastroparesis. Last EGD years ago limited by retained food in the stomach. Suspected to have dysmotility of her esophagus leading to her dysphagia. Placed on Voquezna since office visit and her reflux and nausea is much better controlled, doing better from that perspective. EGD to rule out Barrett's, ensure patent pyloric outlet, and dilate if any stricture seen. She states she thinks she is at baseline for her respiratory status. Otherwise denies complaints today. Case done at the hospital for anesthesia support given her underlying lung disease.   I have discussed risks / benefits of anesthesia and endoscopic procedure with [name] and they wish to proceed with the exams as outlined today.    Past Medical History:  Diagnosis Date   Acute respiratory failure with hypoxia due to flash pulmonary edema requiring intubation 02/13/2020   Calculus of gallbladder with acute cholecystitis without obstruction    CHF (congestive heart failure) (HCC)    Gastroparesis    GERD (gastroesophageal reflux disease)    HTN (hypertension) 03/23/2017   Hypothyroidism    ILD (interstitial lung disease) (HCC) 02/08/2018   Multinodular thyroid    benign FNA 08/2017.   Perimenopause 02/27/2019   Pulmonary edema    Pulmonary hypertension (HCC) 08/04/2021   Raynaud's disease 11/10/2021   Scleroderma (HCC)    Sjogren's syndrome (HCC) 08/30/2017   Status post laparoscopic cholecystectomy 02/13/20 02/13/2020   Vitamin D deficiency disease 02/27/2019    Past Surgical History:  Procedure Laterality Date   ABDOMINAL HERNIA REPAIR     BUNIONECTOMY  Bilateral 10/2018   CHOLECYSTECTOMY N/A 02/13/2020   Procedure: LAPAROSCOPIC CHOLECYSTECTOMY;  Surgeon: Franky Macho, MD;  Location: [facility];  Service: General;  Laterality: N/A;   COLONOSCOPY WITH PROPOFOL N/A 01/02/2019   Procedure: COLONOSCOPY WITH PROPOFOL;  Surgeon: Corbin Ade, MD;  Location: [facility];  Service: Endoscopy;  Laterality: N/A;  12:30pm   ESOPHAGOGASTRODUODENOSCOPY (EGD) WITH PROPOFOL N/A 01/28/2018   erosive reflux esophagitis, patulous EG Junction, no dilation, incomplete EGD due to retained food in stomach. GES thereafter with delayed gastric emptying.    RIGHT HEART CATH N/A 09/27/2017   Procedure: RIGHT HEART CATH;  Surgeon: Dolores Patty, MD;  Location: [facility] INVASIVE CV LAB;  Service: Cardiovascular;  Laterality: N/A;   RIGHT HEART CATH N/A 09/05/2019   Procedure: RIGHT HEART CATH;  Surgeon: Dolores Patty, MD;  Location: [facility] INVASIVE CV LAB;  Service: Cardiovascular;  Laterality: N/A;   RIGHT/LEFT HEART CATH AND CORONARY ANGIOGRAPHY N/A 08/18/2021   Procedure: RIGHT/LEFT HEART CATH AND CORONARY ANGIOGRAPHY;  Surgeon: Dolores Patty, MD;  Location: [facility] INVASIVE CV LAB;  Service: Cardiovascular;  Laterality: N/A;   UTERINE FIBROID SURGERY      Prior to Admission medications   Medication Sig Start Date End Date Taking? Authorizing Provider  bisoprolol (ZEBETA) 5 MG tablet Take 1 tablet (5 mg total) by mouth daily. 08/24/22  Yes Bensimhon, Bevelyn Buckles, MD  furosemide (LASIX) 40 MG tablet Take 1 tablet (40 mg total) by mouth 3 (three) times a week. Mon, Wed, Fri Patient taking differently: Take 40-80 mg by mouth daily as needed for  fluid. 08/25/22  Yes Bensimhon, Bevelyn Buckles, MD  macitentan (OPSUMIT) 10 MG tablet Take 1 tablet (10 mg total) by mouth daily. 03/24/22  Yes Bensimhon, Bevelyn Buckles, MD  mycophenolate (CELLCEPT) 200 MG/ML suspension Take 7.5 mLs (1,500 mg total) by mouth 2 (two) times daily. 04/23/23  Yes Mannam, Praveen, MD  Nintedanib (OFEV) 150 MG CAPS  Take 1 capsule (150 mg total) by mouth 2 (two) times daily. 08/28/22  Yes Quentin Angst, MD  NP THYROID 120 MG tablet Take 1 tablet (120 mg total) by mouth daily before breakfast. 03/01/23  Yes Anabel Halon, MD  potassium chloride 20 MEQ/15ML (10%) SOLN Take 15 mLs (20 mEq total) by mouth daily with furosemide Patient taking differently: Take 20 mEq by mouth daily as needed (Lasix use). 08/17/22  Yes Anabel Halon, MD  predniSONE (DELTASONE) 10 MG tablet Take 4 tablets (40 mg total) by mouth daily with breakfast. 04/20/23 04/19/24 Yes Mannam, Praveen, MD  Prucalopride Succinate (MOTEGRITY) 2 MG TABS Take 1 tablet (2 mg total) by mouth daily. 05/16/23  Yes Zyshonne Malecha, Willaim Rayas, MD  spironolactone (ALDACTONE) 25 MG tablet Take 1 tablet (25 mg total) by mouth 2 (two) times daily. Patient taking differently: Take 50 mg by mouth daily. 12/21/22  Yes Anabel Halon, MD  sulfamethoxazole-trimethoprim (BACTRIM DS) 800-160 MG tablet Take 1 tablet by mouth 3 (three) times a week. 04/20/23 04/19/24 Yes Mannam, Praveen, MD  Vonoprazan Fumarate (VOQUEZNA) 10 MG TABS Take 1 tablet by mouth daily. 06/15/23  Yes Xiong Haidar, Willaim Rayas, MD  VOQUEZNA 20 MG TABS Take 20 mg by mouth daily. Lot: [id number], exp: 12-2023 05/16/23  Yes Shae Augello, Willaim Rayas, MD  acetaminophen (TYLENOL) 325 MG tablet Take 2 tablets (650 mg total) by mouth every 6 (six) hours as needed for mild pain, fever or headache (or Fever >/= 101). Patient taking differently: Take 325-650 mg by mouth as needed for mild pain (pain score 1-3), fever or headache (or Fever >/= 101). 02/15/20   Shon Hale, MD  cephALEXin (KEFLEX) 500 MG capsule Take 1 capsule (500 mg total) by mouth 4 (four) times daily. 07/25/23   Halford Decamp, PA-C  PARoxetine (PAXIL) 10 MG tablet Take 1 tablet (10 mg total) by mouth daily as directed 10/07/20 12/02/20      Current Facility-Administered Medications  Medication Dose Route Frequency Provider Last Rate Last Admin    0.9 %  sodium chloride infusion   Intravenous Continuous Arrington Bencomo, Willaim Rayas, MD       0.9 %  sodium chloride infusion    Continuous PRN Adela Lank, Willaim Rayas, MD 10 mL/hr at 08/02/23 0919 500 mL at 08/02/23 0919    Allergies as of 06/14/2023 - Review Complete 06/06/2023  Allergen Reaction Noted   Lisinopril Hives, Swelling, and Other (See Comments) 08/13/2015   Tocilizumab Other (See Comments) 06/06/2022    Family History  Problem Relation Age of Onset   Hypertension Father    Hypertension Sister    Hypertension Brother    Diabetes Maternal Grandfather    Diabetes Maternal Uncle    Diabetes Mother    Colon cancer Neg Hx     Social History   Socioeconomic History   Marital status: Single    Spouse name: Not on file   Number of children: 0   Years of education: Not on file   Highest education level: Associate degree: occupational, Scientist, product/process development, or vocational program  Occupational History   Occupation: CMA    Employer: Software engineer  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 She lives alone in one level home, a few steps to enter.CMA [employer].   Social Drivers of Corporate investment banker Strain: Not on file  Food Insecurity: No Food Insecurity (02/04/2023)   Hunger Vital Sign    Worried About Running Out of Food in the Last Year: Never true    Ran Out of Food in the Last Year: Never true  Transportation Needs: No Transportation Needs (02/04/2023)   PRAPARE - Administrator, Civil Service (Medical): No    Lack of Transportation (Non-Medical): No  Physical Activity: Not on file  Stress: Not on file  Social Connections: Not on file  Intimate Partner Violence: Not At Risk  (02/04/2023)   Humiliation, Afraid, Rape, and Kick questionnaire    Fear of Current or Ex-Partner: No    Emotionally Abused: No    Physically Abused: No    Sexually Abused: No    Review of Systems: All other review of systems negative except as mentioned in the HPI.  Physical Exam: Vital signs BP (!) (P) 154/116   Pulse (P) 92   Temp (!) 97.2 F (36.2 C) (Tympanic)   Resp 18   Ht 5\' 5"  (1.651 m)   Wt 59 kg   SpO2 100%   BMI 21.64 kg/m   General:   Alert,  Well-developed, pleasant and cooperative in NAD Lungs:  Clear throughout to auscultation.   Heart:  Regular rate and rhythm Abdomen:  Soft, nontender and nondistended.   Neuro/Psych:  Alert and cooperative. Normal mood and affect. A and O x 3  Harlin Rain, MD [facility]

## 2023-08-02 NOTE — Op Note (Signed)
 [facility] Patient Name: Karen [name] Procedure Date: 08/02/2023 MRN: [id number] Attending MD: Willaim Rayas. Adela Lank , MD, [id number] Date of Birth: 1970/09/16 CSN: [id number] Age: 53 Admit Type: Outpatient Procedure:                Upper GI endoscopy Indications:              Dysphagia, follow-up of gastro-esophageal reflux                            disease, history of gastroparesis - last EGD in                            2019 limited exam due to retained food in the                            stomach. Patient has been on liquids for a few days                            prior to this exam. On Voquezna since office visit                            with signfiicant improvement in reflux. Providers:                Willaim Rayas. Adela Lank, MD, Fransisca Connors,                            Sunday Corn Mbumina, Technician Referring MD:              Medicines:                Monitored Anesthesia Care Complications:            No immediate complications. Estimated blood loss:                            Minimal. Estimated Blood Loss:     Estimated blood loss was minimal. Procedure:                Pre-Anesthesia Assessment:                           - Prior to the procedure, a History and Physical                            was performed, and patient medications and                            allergies were reviewed. The patient's tolerance of                            previous anesthesia was also reviewed. The risks                            and benefits of the procedure and the sedation  options and risks were discussed with the patient.                            All questions were answered, and informed consent                            was obtained. Prior Anticoagulants: The patient has                            taken no anticoagulant or antiplatelet agents. ASA                            Grade Assessment: III - A patient with severe                             systemic disease. After reviewing the risks and                            benefits, the patient was deemed in satisfactory                            condition to undergo the procedure.                           After obtaining informed consent, the endoscope was                            passed under direct vision. Throughout the                            procedure, the patient's blood pressure, pulse, and                            oxygen saturations were monitored continuously. The                            [id number]-[id number] ([id number] Olympus endoscope was introduced                            through the mouth, and advanced to the second part                            of duodenum. The upper GI endoscopy was                            accomplished without difficulty. The patient                            tolerated the procedure well. Scope In: Scope Out: Findings:      The Z-line was regular.      A 1 cm hiatal hernia was present.      The exam of the esophagus was otherwise normal.      Biopsies were taken with a cold forceps in the mid esophagus  and in the       distal esophagus for histology - rule out EoE. No focal stenosis /       stricture appreciated. Empiric dilation not performed given residual       food in the stomach.      A medium amount of food (residue) was found in the gastric body and in       the gastric antrum. In this light, very quick exam of the stomach       performed to ensure patent pylorus in light of gastroparesis, and to       ensure cardia looks okay in light of dysphagia. Both were normal.      The exam of the stomach was otherwise normal of what was visualized.      Biopsies were taken with a cold forceps for Helicobacter pylori testing.      There was residual food remnants in the duodenum. Of what was visualized       of the examined duodenum with a very bried exam, it was normal. Impression:               - Z-line regular.                            - 1 cm hiatal hernia.                           - Normal esophagus otherwise - no focal stenosis /                            stricture seen, biopsies taken to rule out EoE.                            Empiric dilation not performed given residual food                            in the stomach.                           - A medium amount of food (residue) in the stomach.                           - Normal stomach otherwise of what was seen -                            patent pyloric channel and normal cardia.                           - Biopsies taken to rule out H pylori although her                            upper tract symptoms are very likely due to                            gastroparesis.                           - Normal examined  duodenum.                           Dysphagia likely due to scleroderma, and upper                            tract symptoms due to gastroparesis. She has had a                            reaction to Reglan remotely, and Motegrity caused                            nausea. Moderate Sedation:      No moderate sedation, case performed with MAC Recommendation:           - Patient has a contact number available for                            emergencies. The signs and symptoms of potential                            delayed complications were discussed with the                            patient. Return to normal activities tomorrow.                            Written discharge instructions were provided to the                            patient.                           - Resume previous diet.                           - Continue present medications.                           - Continue Voquezna given significant clinical                            improvement.                           - Await pathology results. Procedure Code(s):        --- Professional ---                           [phone], Esophagogastroduodenoscopy, flexible,                             transoral; with biopsy, single or multiple Diagnosis Code(s):        --- Professional ---                           K44.9, Diaphragmatic hernia without obstruction or  gangrene                           R13.10, Dysphagia, unspecified                           K21.9, Gastro-esophageal reflux disease without                            esophagitis                           K31.84, Gastroparesis CPT copyright 2022 American Medical Association. All rights reserved. The codes documented in this report are preliminary and upon coder review may  be revised to meet current compliance requirements. Viviann Spare P. Ellinor Test, MD 08/02/2023 10:34:11 AM This report has been signed electronically. Number of Addenda: 0

## 2023-08-03 ENCOUNTER — Encounter: Payer: Self-pay | Admitting: Gastroenterology

## 2023-08-03 ENCOUNTER — Encounter (HOSPITAL_COMMUNITY): Payer: Self-pay | Admitting: Gastroenterology

## 2023-08-03 LAB — SURGICAL PATHOLOGY

## 2023-08-03 NOTE — Anesthesia Postprocedure Evaluation (Signed)
 Anesthesia Post Note  Patient: Karen [name]  Procedure(s) Performed: ESOPHAGOGASTRODUODENOSCOPY (EGD) WITH PROPOFOL BIOPSY, SKIN, SUBCUTANEOUS TISSUE, OR MUCOUS MEMBRANE     Patient location during evaluation: Endoscopy Anesthesia Type: MAC Level of consciousness: awake and alert Pain management: pain level controlled Vital Signs Assessment: post-procedure vital signs reviewed and stable Respiratory status: spontaneous breathing, nonlabored ventilation, respiratory function stable and patient connected to nasal cannula oxygen Cardiovascular status: stable and blood pressure returned to baseline Postop Assessment: no apparent nausea or vomiting Anesthetic complications: no   No notable events documented.  Last Vitals:  Vitals:   08/02/23 1120 08/02/23 1128  BP: (!) 170/108 (!) 165/98  Pulse: 86 85  Resp: (!) 31 19  Temp:    SpO2: 100% (P) 100%    Last Pain:  Vitals:   08/02/23 1128  TempSrc:   PainSc: 0-No pain                 Hoang Pettingill S

## 2023-08-08 DIAGNOSIS — M5412 Radiculopathy, cervical region: Secondary | ICD-10-CM

## 2023-08-08 DIAGNOSIS — M79642 Pain in left hand: Secondary | ICD-10-CM | POA: Insufficient documentation

## 2023-08-08 DIAGNOSIS — R5383 Other fatigue: Secondary | ICD-10-CM | POA: Insufficient documentation

## 2023-08-08 HISTORY — DX: Radiculopathy, cervical region: M54.12

## 2023-08-09 ENCOUNTER — Ambulatory Visit: Payer: Commercial Managed Care - PPO | Admitting: Neurology

## 2023-08-09 ENCOUNTER — Encounter (HOSPITAL_COMMUNITY): Payer: Self-pay | Admitting: Internal Medicine

## 2023-08-09 ENCOUNTER — Encounter: Payer: Self-pay | Admitting: Neurology

## 2023-08-09 ENCOUNTER — Ambulatory Visit (HOSPITAL_COMMUNITY)
Admission: RE | Admit: 2023-08-09 | Discharge: 2023-08-09 | Disposition: A | Discharge status: Home / Self Care | Payer: Commercial Managed Care - PPO | Source: Ambulatory Visit | Attending: Internal Medicine | Admitting: Internal Medicine

## 2023-08-09 ENCOUNTER — Other Ambulatory Visit (HOSPITAL_COMMUNITY): Payer: Self-pay

## 2023-08-09 VITALS — BP 142/86 | HR 88 | Ht 64.5 in | Wt 133.0 lb

## 2023-08-09 VITALS — BP 186/92 | HR 83 | Ht 64.5 in | Wt 131.0 lb

## 2023-08-09 DIAGNOSIS — M349 Systemic sclerosis, unspecified: Secondary | ICD-10-CM

## 2023-08-09 DIAGNOSIS — R519 Headache, unspecified: Secondary | ICD-10-CM

## 2023-08-09 DIAGNOSIS — I272 Pulmonary hypertension, unspecified: Secondary | ICD-10-CM | POA: Diagnosis not present

## 2023-08-09 DIAGNOSIS — I5032 Chronic diastolic (congestive) heart failure: Secondary | ICD-10-CM

## 2023-08-09 DIAGNOSIS — K219 Gastro-esophageal reflux disease without esophagitis: Secondary | ICD-10-CM | POA: Insufficient documentation

## 2023-08-09 DIAGNOSIS — M3500 Sicca syndrome, unspecified: Secondary | ICD-10-CM | POA: Diagnosis not present

## 2023-08-09 DIAGNOSIS — I2510 Atherosclerotic heart disease of native coronary artery without angina pectoris: Secondary | ICD-10-CM | POA: Diagnosis not present

## 2023-08-09 DIAGNOSIS — I110 Hypertensive heart disease with heart failure: Secondary | ICD-10-CM | POA: Diagnosis not present

## 2023-08-09 DIAGNOSIS — G8929 Other chronic pain: Secondary | ICD-10-CM

## 2023-08-09 DIAGNOSIS — R059 Cough, unspecified: Secondary | ICD-10-CM | POA: Diagnosis not present

## 2023-08-09 DIAGNOSIS — R202 Paresthesia of skin: Secondary | ICD-10-CM

## 2023-08-09 DIAGNOSIS — Z79624 Long term (current) use of inhibitors of nucleotide synthesis: Secondary | ICD-10-CM | POA: Insufficient documentation

## 2023-08-09 DIAGNOSIS — I10 Essential (primary) hypertension: Secondary | ICD-10-CM

## 2023-08-09 DIAGNOSIS — Z79899 Other long term (current) drug therapy: Secondary | ICD-10-CM | POA: Insufficient documentation

## 2023-08-09 DIAGNOSIS — M542 Cervicalgia: Secondary | ICD-10-CM | POA: Diagnosis not present

## 2023-08-09 DIAGNOSIS — J8410 Pulmonary fibrosis, unspecified: Secondary | ICD-10-CM | POA: Insufficient documentation

## 2023-08-09 DIAGNOSIS — I2721 Secondary pulmonary arterial hypertension: Secondary | ICD-10-CM | POA: Insufficient documentation

## 2023-08-09 MED ORDER — OLMESARTAN MEDOXOMIL 20 MG PO TABS
20.0000 mg | ORAL_TABLET | Freq: Every day | ORAL | 3 refills | Status: DC
  Filled 2023-08-09: qty 90, 90d supply, fill #0

## 2023-08-09 MED ORDER — MACITENTAN 10 MG PO TABS: 10.0000 mg | ORAL_TABLET | Freq: Every day | ORAL | 11 refills | Status: AC

## 2023-08-09 MED ORDER — BISOPROLOL FUMARATE 5 MG PO TABS
5.0000 mg | ORAL_TABLET | Freq: Every day | ORAL | 3 refills | Status: DC
  Filled 2023-08-09: qty 90, 90d supply, fill #0
  Filled 2023-12-24: qty 90, 90d supply, fill #1

## 2023-08-09 NOTE — Progress Notes (Addendum)
 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   RHC in 5/19 with minimally elevated pressures and hi-res CT which showed ILD. Follows with Dr. Isaiah Serge . F/u CT in 2/20 showed stable IL  Started macitentan in May 2021   Echo 07/21/21 EF 60-65% RV ok Personally reviewed  Repeat cath 4/23 for worsening CP/SOB   1. Very mild non-obstructive CAD (LAD 20%, OM-1 20%). LVEF 60-65%  Ao = 118/82 (100) LV = 115/9 RA = 3 RV = 35/4 PA = 36/14 (23) PCW = 3 Fick cardiac output/index = 7.3/4.4 PVR = 2.7 Ao sat = 96%  PA sat = 73%, 74% High SVC sat = 78%   Hi-res CT 3/24: Moderate ILD (no change)   Returns for f/u with her mom. Continues to work with Tereso Newcomer PA in [place]. Much more symptomatic. Struggling with high BP. Recently seen in ER with SBP 180 and HA. Feels more SOB and fatigued. HR in low 100s. No edema, orthopnea or PND. Takes lasix PRN but hasn't required it in a while - says she has been having problems with low fluid. Having periods of severe cough. Now on Voquenza for GERD. Going to Pulmonary Rehab on Thursdays   Studies:   Echo 10/21 EF 65-70% RV ok Hi-res CT 2/21 and 1/22: Stable ILD Echo 11/18/18: EF hyperdynamic 65-70% RV normal  Echo 5/19: EF 60-65% grade I DD RV normal. Mild TR.   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  Aortic atherosclerosis, three-vessel coronary artery disease.   CT high-resolution 06/27/2018-stable interstitial lung disease. I reviewed the images personally.  Hi res CT 02/05/19: stable ILD    PFTs  FENO 09/06/2017-unable to complete  07/27/2017 FVC 1.65 (54%), FEV1 1.53 (62%], F/F 93, TLC 50, DLCO 44%, DLCO/VA 131% 10/25/2017 FVC 1.51 [50%], FEV1 1.26 [51%], F/F 83 01/29/2018 FVC 1.69 [5%), FEV1 1.50 [61%], F/F 89, DLCO 10.57 [41%] 01/06/2019 FVC 1.58 [52%], FEV1 1.46 [60%], F/F 92, TLC 2.62 [50%], DLCO 9.16 [42%] Severe restriction and diffusion impairment.  FVC is stable but DLCO has worsened.   6-minute walk  10/23/2017- 144 m Post walk heart rate, stats 94, 91%   02/04/2018- 249 m Post walk heart rate, sats 101, 99%   06/06/2018-212 m Post walk heart rate, sats 86, 92%   01/30/2019-146m Post walk heart rate, sats 83, 98%      Past Medical History:  Diagnosis Date   Acute respiratory failure with hypoxia due to flash pulmonary edema requiring intubation 02/13/2020   Calculus of gallbladder  with acute cholecystitis without obstruction    CHF (congestive heart failure) (HCC)    Gastroparesis    GERD (gastroesophageal reflux disease)    HTN (hypertension) 03/23/2017   Hypothyroidism    ILD (interstitial lung disease) (HCC) 02/08/2018   Multinodular thyroid    benign FNA 08/2017.   Perimenopause 02/27/2019   Pulmonary edema    Pulmonary hypertension (HCC) 08/04/2021   Raynaud's disease 11/10/2021   Scleroderma (HCC)    Sjogren's syndrome (HCC) 08/30/2017   Status post laparoscopic cholecystectomy 02/13/20 02/13/2020   Vitamin D deficiency disease 02/27/2019   Past Surgical History:  Procedure Laterality Date   ABDOMINAL HERNIA REPAIR      BIOPSY OF SKIN SUBCUTANEOUS TISSUE AND/OR MUCOUS MEMBRANE  08/02/2023   Procedure: BIOPSY, SKIN, SUBCUTANEOUS TISSUE, OR MUCOUS MEMBRANE;  Surgeon: Benancio Deeds, MD;  Location: [facility];  Service: Gastroenterology;;   Arbutus Leas Bilateral 10/2018   CHOLECYSTECTOMY N/A 02/13/2020   Procedure: LAPAROSCOPIC CHOLECYSTECTOMY;  Surgeon: Franky Macho, MD;  [facility];  Service: General;  Laterality: N/A;   COLONOSCOPY WITH PROPOFOL N/A 01/02/2019   Procedure: COLONOSCOPY WITH PROPOFOL;  Surgeon: Corbin Ade, MD;  [facility];  Service: Endoscopy;  Laterality: N/A;  12:30pm   ESOPHAGOGASTRODUODENOSCOPY (EGD) WITH PROPOFOL N/A 01/28/2018   erosive reflux esophagitis, patulous EG Junction, no dilation, incomplete EGD due to retained food in stomach. GES thereafter with delayed gastric emptying.    ESOPHAGOGASTRODUODENOSCOPY (EGD) WITH PROPOFOL N/A 08/02/2023   Procedure: ESOPHAGOGASTRODUODENOSCOPY (EGD) WITH PROPOFOL;  Surgeon: Benancio Deeds, MD;  [facility];  Service: Gastroenterology;  Laterality: N/A;   RIGHT HEART CATH N/A 09/27/2017   Procedure: RIGHT HEART CATH;  Surgeon: Dolores Patty, MD;  [facility];  Service: Cardiovascular;  Laterality: N/A;   RIGHT HEART CATH N/A 09/05/2019   Procedure: RIGHT HEART CATH;  Surgeon: Dolores Patty, MD;  [facility];  Service: Cardiovascular;  Laterality: N/A;   RIGHT/LEFT HEART CATH AND CORONARY ANGIOGRAPHY N/A 08/18/2021   Procedure: RIGHT/LEFT HEART CATH AND CORONARY ANGIOGRAPHY;  Surgeon: Dolores Patty, MD;  [facility];  Service: Cardiovascular;  Laterality: N/A;   UTERINE FIBROID SURGERY       Current Outpatient Medications  Medication Sig Dispense Refill   acetaminophen (TYLENOL) 325 MG tablet Take 2 tablets (650 mg total) by mouth every 6 (six) hours as needed for mild pain, fever or headache (or Fever >/= 101). (Patient taking  differently: Take 325-650 mg by mouth as needed for mild pain (pain score 1-3), fever or headache (or Fever >/= 101).) 12 tablet 0   bisoprolol (ZEBETA) 5 MG tablet Take 1 tablet (5 mg total) by mouth daily. 90 tablet 3   cephALEXin (KEFLEX) 500 MG capsule Take 1 capsule (500 mg total) by mouth 4 (four) times daily. 20 capsule 0   furosemide (LASIX) 40 MG tablet Take 1 tablet (40 mg total) by mouth 3 (three) times a week. Mon, Wed, Fri (Patient taking differently: Take 40-80 mg by mouth daily as needed for fluid.) 30 tablet 6   macitentan (OPSUMIT) 10 MG tablet Take 1 tablet (10 mg total) by mouth daily. 30 tablet 11   mycophenolate (CELLCEPT) 200 MG/ML suspension Take 7.5 mLs (1,500 mg total) by mouth 2 (two) times daily. 480 mL 5   Nintedanib (OFEV) 150 MG CAPS Take 1 capsule (150 mg total) by mouth 2 (two) times daily. 60 capsule 5   NP THYROID 120  MG tablet Take 1 tablet (120 mg total) by mouth daily before breakfast. 90 tablet 0   potassium chloride 20 MEQ/15ML (10%) SOLN Take 15 mLs (20 mEq total) by mouth daily with furosemide (Patient taking differently: Take 20 mEq by mouth daily as needed (Lasix use).) 450 mL 3   predniSONE (DELTASONE) 10 MG tablet Take 4 tablets (40 mg total) by mouth daily with breakfast. 360 tablet 3   Prucalopride Succinate (MOTEGRITY) 2 MG TABS Take 1 tablet (2 mg total) by mouth daily. 90 tablet 1   spironolactone (ALDACTONE) 25 MG tablet Take 1 tablet (25 mg total) by mouth 2 (two) times daily. (Patient taking differently: Take 50 mg by mouth daily.) 180 tablet 1   sulfamethoxazole-trimethoprim (BACTRIM DS) 800-160 MG tablet Take 1 tablet by mouth 3 (three) times a week. 36 tablet 3   Vonoprazan Fumarate (VOQUEZNA) 10 MG TABS Take 1 tablet by mouth daily. 30 tablet 6   VOQUEZNA 20 MG TABS Take 20 mg by mouth daily. Lot: [id number], exp: 12-2023 10 tablet 0   No current facility-administered medications for this encounter.    Allergies:   Lisinopril and Tocilizumab    Social History:  The patient  reports that she has never smoked. She has never used smokeless tobacco. She reports that she does not drink alcohol and does not use drugs.   Family History:  The patient's family history includes Diabetes in her maternal grandfather, maternal uncle, and mother; Hypertension in her brother, father, and sister.   ROS:  Please see the history of present illness.   All other systems are personally reviewed and negative.   Vitals:   08/09/23 1158  BP: (!) 186/92  Pulse: 83  SpO2: 95%  Weight: 59.4 kg (131 lb)  Height: 5' 4.5" (1.638 m)    Exam:   General:  Weak appearing. No resp difficulty HEENT: normal Neck: supple. no JVD. Carotids 2+ bilat; no bruits. No lymphadenopathy or thryomegaly appreciated. Cor: PMI nondisplaced. Regular rate & rhythm. No rubs, gallops or murmurs. Lungs: clear Abdomen: soft, nontender, nondistended. No hepatosplenomegaly. No bruits or masses. Good bowel sounds. Extremities: no cyanosis, clubbing, rash, edema Neuro: alert & orientedx3, cranial nerves grossly intact. moves all 4 extremities w/o difficulty. Affect pleasant  Recent Labs: 02/04/2023: TSH 0.986 07/25/2023: ALT 10; B Natriuretic Peptide 196.8; BUN 7; Creatinine, Ser 0.49; Hemoglobin 11.8; Magnesium 2.6; Platelets 192; Potassium 3.3; Sodium 141  Personally reviewed   Wt Readings from Last 3 Encounters:  08/09/23 59.4 kg (131 lb)  08/02/23 59 kg (130 lb 1.1 oz)  07/25/23 59 kg (130 lb)      ASSESSMENT AND PLAN:  1. Chronic diastolic HF - Echo  07/21/21 EF 60-65% G1DD RV ok  - Volume status much improved. Now likely dry - NYHA III - likely due primarily from scleroderma and ILD - Continue lasix 40 prn - Repeat echo at next visit  2. PAH, mild - Who GROUP I and IIII - Continue macitentan - RHC 4/23 RA 3 PA 6/14 (23) PCW 3 Fick 7.3/4.4 PVR = 2.7 - repeat echo  3. HTN, severe  - No evidence of scleroderma renal crisis - Add Benciar 20 - Keep BP log and  send back to me in 3 weeks   4. Scleroderma - Followed by Dr Nickola Major - continue Cellcept - No change   5. Pulmonary fibrosis - High res CT chest c/w with ILD. Stable 2/21 and 1/22 and 3/24 - Followed by Dr Isaiah Serge - Continue Ofev &  Cellcept - Following in [facility] - Suspect this is major driver of her symptoms. Encouraged her to continue pulmonary rehab. Have given her a work note to help her adjust schedule to permit this   Signed, Arvilla Meres, MD  08/09/2023 12:19 PM  [facility] [address] [facility] [place] [address] [phone] (office) [phone] (fax)

## 2023-08-09 NOTE — Progress Notes (Signed)
 Chief Complaint  Patient presents with   New Patient (Initial Visit)    Pt in 15, here with mother Merdis Delay NP/Paper/Murphy Wainer/Josh Chadwell PA [phone]/bilateral CTS NCV/EMG: pt states left hand thumb is numb, also having neck pain and headaches.       ASSESSMENT AND PLAN  Karen [name] is a 53 y.o. female   Left arm paresthesia  EMG nerve conduction study to rule out left upper extremity focal neuropathy  Gait abnormality frequent headaches  MRI of the brain to rule out central nervous system structural abnormality  DIAGNOSTIC DATA (LABS, IMAGING, TESTING) - I reviewed patient records, labs, notes, testing and imaging myself where available.   MEDICAL HISTORY:  Karen [name] is a 52 year old right-handed female accompanied by her mother, seen in request by orthopedic surgeon PA for evaluation of left arm numbness, her primary care is Dr., Allena Katz, Earlie Lou, initial evaluation 12/29/2023  History is obtained from the patient and review of electronic medical records. I personally reviewed pertinent available imaging films in PACS.   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, around 2024, woke up from overnight sleep noticed neck pain, radiating to bilateral occipital region, down to left arm, especially left thumb,  She was seen by [facility],  MRI  of cervical spine from [facility] orthopedic specialist mild cervical spondylosis, most pronounced at C6-C7, C7-T1 levels there is mild canal stenosis mild to moderate bilateral foraminal stenosis,  Had epidural injection which helped her neck pain, but With persistent left arm and hand paresthesia,  In addition she complains of unsteady gait, frequent headaches   PHYSICAL EXAM:   Vitals:   08/09/23 1531  BP: (!) 142/86  Pulse: 88  Weight: 133 lb (60.3 kg)  Height: 5' 4.5" (1.638  m)    Body mass index is 22.48 kg/m.  PHYSICAL EXAMNIATION:  Gen: NAD, conversant, well nourised, well groomed                     Cardiovascular: Regular rate rhythm, no peripheral edema, warm, nontender. Eyes: Conjunctivae clear without exudates or hemorrhage Neck: Supple, no carotid bruits. Pulmonary: Clear to auscultation bilaterally   NEUROLOGICAL EXAM:  MENTAL STATUS: Speech/cognition: Depressed looking middle-age female awake, alert, oriented to history taking and casual conversation CRANIAL NERVES: CN II: Visual fields are full to confrontation. Pupils are round equal and briskly reactive to light. CN III, IV, VI: extraocular movement are normal. No ptosis. CN V: Facial sensation is intact to light touch CN VII: Face is symmetric with normal eye closure  CN VIII: Hearing is normal to causal conversation. CN IX, X: Phonation is normal. CN XI: Head turning and shoulder shrug are intact  MOTOR: There is no pronator drift of out-stretched arms. Muscle bulk and tone are normal. Muscle strength is normal.  REFLEXES: Reflexes are 2+ and symmetric at the biceps, triceps, knees, and ankles. Plantar responses are flexor.  SENSORY: Intact to light touch, pinprick and vibratory sensation are intact in fingers and toes.  COORDINATION: There is no trunk or limb dysmetria noted.  GAIT/STANCE: Posture is normal. Gait is steady with normal steps, base, arm swing, and turning. Heel and toe walking are normal. Tandem gait is normal.  Romberg is absent.  REVIEW OF SYSTEMS:  Full 14 system review of systems performed and notable only for as above All other review of systems were negative.  ALLERGIES: Allergies  Allergen Reactions   Lisinopril Hives, Swelling and Other (See Comments)   Tocilizumab Other (See Comments)    Swelling and shortness of breath sharpe pains in back and chest tightness.    HOME MEDICATIONS: Current Outpatient Medications  Medication Sig Dispense  Refill   acetaminophen (TYLENOL) 325 MG tablet Take 2 tablets (650 mg total) by mouth every 6 (six) hours as needed for mild pain, fever or headache (or Fever >/= 101). (Patient taking differently: Take 325-650 mg by mouth as needed for mild pain (pain score 1-3), fever or headache (or Fever >/= 101).) 12 tablet 0   bisoprolol (ZEBETA) 5 MG tablet Take 1 tablet (5 mg total) by mouth daily. 90 tablet 3   cephALEXin (KEFLEX) 500 MG capsule Take 1 capsule (500 mg total) by mouth 4 (four) times daily. 20 capsule 0   furosemide (LASIX) 40 MG tablet Take 1 tablet (40 mg total) by mouth 3 (three) times a week. Mon, Wed, Fri (Patient taking differently: Take 40-80 mg by mouth daily as needed for fluid.) 30 tablet 6   macitentan (OPSUMIT) 10 MG tablet Take 1 tablet (10 mg total) by mouth daily. 30 tablet 11   mycophenolate (CELLCEPT) 200 MG/ML suspension Take 7.5 mLs (1,500 mg total) by mouth 2 (two) times daily. 480 mL 5   Nintedanib (OFEV) 150 MG CAPS Take 1 capsule (150 mg total) by mouth 2 (two) times daily. 60 capsule 5   NP THYROID 120 MG tablet Take 1 tablet (120 mg total) by mouth daily before breakfast. 90 tablet 0   olmesartan (BENICAR) 20 MG tablet Take 1 tablet (20 mg total) by mouth daily. 90 tablet 3   potassium chloride 20 MEQ/15ML (10%) SOLN Take 15 mLs (20 mEq total) by mouth daily with furosemide (Patient taking differently: Take 20 mEq by mouth daily as needed (Lasix use).) 450 mL 3   Prucalopride Succinate (MOTEGRITY) 2 MG TABS Take 1 tablet (2 mg total) by mouth daily. 90 tablet 1   spironolactone (ALDACTONE) 25 MG tablet Take 1 tablet (25 mg total) by mouth 2 (two) times daily. (Patient taking differently: Take 50 mg by mouth daily.) 180 tablet 1   sulfamethoxazole-trimethoprim (BACTRIM DS) 800-160 MG tablet Take 1 tablet by mouth 3 (three) times a week. 36 tablet 3   Vonoprazan Fumarate (VOQUEZNA) 10 MG TABS Take 1 tablet by mouth daily. 30 tablet 6   VOQUEZNA 20 MG TABS Take 20 mg by  mouth daily. Lot: [id number], exp: 12-2023 10 tablet 0   No current facility-administered medications for this visit.    PAST MEDICAL HISTORY: Past Medical History:  Diagnosis Date   Acute respiratory failure with hypoxia due to flash pulmonary edema requiring intubation 02/13/2020   Calculus of gallbladder with acute cholecystitis without obstruction    CHF (congestive heart failure) (HCC)    Gastroparesis    GERD (gastroesophageal reflux disease)    HTN (hypertension) 03/23/2017   Hypothyroidism    ILD (interstitial lung disease) (HCC) 02/08/2018   Multinodular thyroid    benign FNA 08/2017.   Perimenopause 02/27/2019   Pulmonary edema    Pulmonary hypertension (HCC) 08/04/2021   Raynaud's disease 11/10/2021   Scleroderma (HCC)    Sjogren's syndrome (HCC) 08/30/2017   Status post laparoscopic cholecystectomy 02/13/20 02/13/2020   Vitamin D deficiency disease 02/27/2019    PAST SURGICAL HISTORY: Past Surgical History:  Procedure Laterality Date   ABDOMINAL HERNIA REPAIR     BIOPSY OF SKIN SUBCUTANEOUS TISSUE AND/OR  MUCOUS MEMBRANE  08/02/2023   Procedure: BIOPSY, SKIN, SUBCUTANEOUS TISSUE, OR MUCOUS MEMBRANE;  Surgeon: Benancio Deeds, MD;  Location: [facility];  Service: Gastroenterology;;   Arbutus Leas Bilateral 10/2018   CHOLECYSTECTOMY N/A 02/13/2020   Procedure: LAPAROSCOPIC CHOLECYSTECTOMY;  Surgeon: Franky Macho, MD;  [facility];  Service: General;  Laterality: N/A;   COLONOSCOPY WITH PROPOFOL N/A 01/02/2019   Procedure: COLONOSCOPY WITH PROPOFOL;  Surgeon: Corbin Ade, MD;  [facility];  Service: Endoscopy;  Laterality: N/A;  12:30pm   ESOPHAGOGASTRODUODENOSCOPY (EGD) WITH PROPOFOL N/A 01/28/2018   erosive reflux esophagitis, patulous EG Junction, no dilation, incomplete EGD due to retained food in stomach. GES thereafter with delayed gastric emptying.    ESOPHAGOGASTRODUODENOSCOPY (EGD) WITH PROPOFOL N/A 08/02/2023   Procedure:  ESOPHAGOGASTRODUODENOSCOPY (EGD) WITH PROPOFOL;  Surgeon: Benancio Deeds, MD;  [facility];  Service: Gastroenterology;  Laterality: N/A;   RIGHT HEART CATH N/A 09/27/2017   Procedure: RIGHT HEART CATH;  Surgeon: Dolores Patty, MD;  [facility];  Service: Cardiovascular;  Laterality: N/A;   RIGHT HEART CATH N/A 09/05/2019   Procedure: RIGHT HEART CATH;  Surgeon: Dolores Patty, MD;  [facility];  Service: Cardiovascular;  Laterality: N/A;   RIGHT/LEFT HEART CATH AND CORONARY ANGIOGRAPHY N/A 08/18/2021   Procedure: RIGHT/LEFT HEART CATH AND CORONARY ANGIOGRAPHY;  Surgeon: Dolores Patty, MD;  [facility];  Service: Cardiovascular;  Laterality: N/A;   UTERINE FIBROID SURGERY      FAMILY HISTORY: Family History  Problem Relation Age of Onset   Hypertension Father    Hypertension Sister    Hypertension Brother    Diabetes Maternal Grandfather    Diabetes Maternal Uncle    Diabetes Mother    Colon cancer Neg Hx     SOCIAL HISTORY: Social History   Socioeconomic History   Marital status: Single    Spouse name: Not on file   Number of children: 0   Years of education: Not on file   Highest education level: Associate degree: occupational, Scientist, product/process development, or vocational program  Occupational History   Occupation: CMA    Employer: [employer]   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 She lives alone in one level home, a few steps to enter.CMA [employer].   Social Drivers of Corporate investment banker Strain: Not on file  Food Insecurity: No Food Insecurity (02/04/2023)   Hunger Vital Sign    Worried About Running Out of Food  in the Last Year: Never true    Ran Out of Food in the Last Year: Never true  Transportation Needs: No Transportation Needs (02/04/2023)   PRAPARE - Administrator, Civil Service (Medical): No    Lack of Transportation (Non-Medical): No  Physical Activity: Not on file  Stress: Not on file  Social Connections: Not on file  Intimate Partner Violence: Not At Risk (02/04/2023)   Humiliation, Afraid, Rape, and Kick questionnaire    Fear of Current or Ex-Partner: No    Emotionally Abused: No    Physically Abused: No    Sexually Abused: No  Levert Feinstein, M.D. Ph.D.  [facility] [address] [place], [place] [address] Ph: [phone] Fax: [phone]  CC:  Margart Sickles, PA-C [address] [place],  [place] [address]  Anabel Halon, MD

## 2023-08-09 NOTE — Patient Instructions (Signed)
 Great to see you today!!!  START Benicar 20 mg Daily  Your physician has requested that you regularly monitor and record your blood pressure readings at home. Please use the same machine at the same time of day to check your readings and record them to bring to your follow-up visit.  We have provided you with a note for work  Your physician recommends that you schedule a follow-up appointment in: 4 months (August), **PLEASE CALL OUR OFFICE IN JUNE TO SCHEDULE THIS APPOINTMENT  If you have any questions or concerns before your next appointment please send [place] a message through [place] or call our office at [phone].    TO LEAVE A MESSAGE FOR THE NURSE SELECT OPTION 2, PLEASE LEAVE A MESSAGE INCLUDING: YOUR NAME DATE OF BIRTH CALL BACK NUMBER REASON FOR CALL**this is important as we prioritize the call backs  YOU WILL RECEIVE A CALL BACK THE SAME DAY AS LONG AS YOU CALL BEFORE 4:00 PM  At the [facility], you and your health needs are our priority. As part of our continuing mission to provide you with exceptional heart care, we have created designated Provider Care Teams. These Care Teams include your primary Cardiologist (physician) and Advanced Practice Providers (APPs- Physician Assistants and Nurse Practitioners) who all work together to provide you with the care you need, when you need it.   You may see any of the following providers on your designated Care Team at your next follow up: Dr Arvilla Meres Dr Marca Ancona Dr. Dorthula Nettles Dr. Clearnce Hasten Amy Filbert Schilder, NP Robbie Lis, [place] [facility] [place], [place] Brynda Peon, NP [place] Lee, NP Clarisa Kindred, NP Karle Plumber, PharmD Enos Fling, PharmD   Please be sure to bring in all your medications bottles to every appointment.    Thank you for choosing [facility]

## 2023-08-22 ENCOUNTER — Ambulatory Visit

## 2023-08-22 DIAGNOSIS — M542 Cervicalgia: Secondary | ICD-10-CM | POA: Diagnosis not present

## 2023-08-22 DIAGNOSIS — R519 Headache, unspecified: Secondary | ICD-10-CM

## 2023-08-22 DIAGNOSIS — G8929 Other chronic pain: Secondary | ICD-10-CM

## 2023-08-22 DIAGNOSIS — R202 Paresthesia of skin: Secondary | ICD-10-CM | POA: Diagnosis not present

## 2023-08-24 ENCOUNTER — Telehealth (HOSPITAL_COMMUNITY): Payer: Self-pay | Admitting: Cardiology

## 2023-08-24 NOTE — Telephone Encounter (Signed)
 Patient called to report her employer will need additional forms for work request. Reports she was given letter explaining why she cannot add hours to her current work schedule, employer reports she needed to complete matrix forms. Reports she will bring packet to the office

## 2023-08-27 ENCOUNTER — Encounter (HOSPITAL_COMMUNITY): Payer: Self-pay | Admitting: Internal Medicine

## 2023-08-27 ENCOUNTER — Encounter: Payer: Self-pay | Admitting: Neurology

## 2023-08-28 ENCOUNTER — Encounter (HOSPITAL_COMMUNITY): Payer: Self-pay | Admitting: *Deleted

## 2023-09-04 ENCOUNTER — Other Ambulatory Visit: Payer: Self-pay | Admitting: Internal Medicine

## 2023-09-04 ENCOUNTER — Other Ambulatory Visit: Payer: Self-pay

## 2023-09-04 ENCOUNTER — Other Ambulatory Visit (HOSPITAL_COMMUNITY): Payer: Self-pay

## 2023-09-04 DIAGNOSIS — E039 Hypothyroidism, unspecified: Secondary | ICD-10-CM

## 2023-09-04 DIAGNOSIS — I10 Essential (primary) hypertension: Secondary | ICD-10-CM

## 2023-09-04 DIAGNOSIS — J849 Interstitial pulmonary disease, unspecified: Secondary | ICD-10-CM

## 2023-09-04 MED ORDER — SPIRONOLACTONE 25 MG PO TABS
25.0000 mg | ORAL_TABLET | Freq: Two times a day (BID) | ORAL | 1 refills | Status: DC
  Filled 2023-09-04 – 2023-12-24 (×2): qty 180, 90d supply, fill #0

## 2023-09-04 MED ORDER — NP THYROID 120 MG PO TABS
120.0000 mg | ORAL_TABLET | Freq: Every day | ORAL | 0 refills | Status: DC
  Filled 2023-09-04: qty 90, 90d supply, fill #0

## 2023-09-05 ENCOUNTER — Telehealth: Payer: Self-pay | Admitting: Pharmacist

## 2023-09-05 ENCOUNTER — Encounter (HOSPITAL_COMMUNITY): Payer: Self-pay

## 2023-09-05 ENCOUNTER — Telehealth: Payer: Self-pay

## 2023-09-05 ENCOUNTER — Other Ambulatory Visit (HOSPITAL_COMMUNITY): Payer: Self-pay

## 2023-09-05 NOTE — Telephone Encounter (Signed)
 Pharmacy Patient Advocate Encounter  Received notification from [facility] that Prior Authorization for Voquezna  10MG  tablets has been APPROVED from 09-05-2023 to 10-03-2023   PA #/Case ID/Reference #: [id number]

## 2023-09-05 NOTE — Telephone Encounter (Signed)
 Pharmacy Patient Advocate Encounter   Received notification from CoverMyMeds that prior authorization for Voquezna  10MG  tablets is required/requested.   Insurance verification completed.   The patient is insured through [facility] .   Per test claim: PA required; PA submitted to above mentioned insurance via CoverMyMeds Key/confirmation #/EOC [id number] Status is pending

## 2023-09-05 NOTE — Telephone Encounter (Signed)
 Pharmacy Patient Advocate Encounter   Received notification from CoverMyMeds that prior authorization for Voquezna  10MG  tablets is required/requested.   Insurance verification completed.   The patient is insured through [facility] .   Per test claim: xxx

## 2023-09-05 NOTE — Telephone Encounter (Signed)
 Called patient to schedule an appointment for the [facility]. I was unable to reach the patient so I left a HIPAA-compliant message requesting that the patient return my call.   Butch Penny, PharmD, Patsy Baltimore, CPP Clinical Pharmacist [facility] [phone]

## 2023-09-07 ENCOUNTER — Telehealth (HOSPITAL_COMMUNITY): Payer: Self-pay | Admitting: Pharmacy Technician

## 2023-09-07 NOTE — Telephone Encounter (Signed)
 Advanced Heart Failure Patient Advocate Encounter  Prior Authorization for Opsumit  has been approved.    PA# [id number] Effective dates: 09/07/23 through 09/06/24  Correne Dillon, CPhT

## 2023-09-07 NOTE — Telephone Encounter (Signed)
 Patient Advocate Encounter   Received notification from [employer] that prior authorization for Opsumit  is required.   PA submitted on CoverMyMeds Key [id number] Status is pending   Will continue to follow.

## 2023-09-11 ENCOUNTER — Other Ambulatory Visit (HOSPITAL_COMMUNITY): Payer: Self-pay

## 2023-09-12 ENCOUNTER — Other Ambulatory Visit: Payer: Self-pay

## 2023-09-13 ENCOUNTER — Encounter: Payer: Self-pay | Admitting: Internal Medicine

## 2023-09-13 ENCOUNTER — Ambulatory Visit: Admitting: Internal Medicine

## 2023-09-13 ENCOUNTER — Other Ambulatory Visit (HOSPITAL_COMMUNITY): Payer: Self-pay

## 2023-09-13 VITALS — BP 164/90 | HR 83 | Ht 64.5 in | Wt 129.4 lb

## 2023-09-13 DIAGNOSIS — E441 Mild protein-calorie malnutrition: Secondary | ICD-10-CM | POA: Diagnosis not present

## 2023-09-13 DIAGNOSIS — M349 Systemic sclerosis, unspecified: Secondary | ICD-10-CM | POA: Diagnosis not present

## 2023-09-13 DIAGNOSIS — I10 Essential (primary) hypertension: Secondary | ICD-10-CM | POA: Diagnosis not present

## 2023-09-13 DIAGNOSIS — K219 Gastro-esophageal reflux disease without esophagitis: Secondary | ICD-10-CM | POA: Diagnosis not present

## 2023-09-13 DIAGNOSIS — N3000 Acute cystitis without hematuria: Secondary | ICD-10-CM

## 2023-09-13 DIAGNOSIS — I272 Pulmonary hypertension, unspecified: Secondary | ICD-10-CM

## 2023-09-13 DIAGNOSIS — E039 Hypothyroidism, unspecified: Secondary | ICD-10-CM | POA: Diagnosis not present

## 2023-09-13 DIAGNOSIS — R300 Dysuria: Secondary | ICD-10-CM

## 2023-09-13 MED ORDER — MIRTAZAPINE 7.5 MG PO TABS
7.5000 mg | ORAL_TABLET | Freq: Every day | ORAL | 3 refills | Status: DC
  Filled 2023-09-13: qty 30, 30d supply, fill #0
  Filled 2023-12-24: qty 30, 30d supply, fill #1

## 2023-09-13 MED ORDER — OLMESARTAN MEDOXOMIL 40 MG PO TABS
40.0000 mg | ORAL_TABLET | Freq: Every day | ORAL | 1 refills | Status: DC
  Filled 2023-09-13: qty 90, 90d supply, fill #0
  Filled 2023-12-24: qty 90, 90d supply, fill #1

## 2023-09-13 NOTE — Assessment & Plan Note (Addendum)
 Lab Results  Component Value Date   TSH 0.986 02/04/2023   On NP thyroid  120 mg QD -needs to take it in a.m. on empty stomach Check TSH and free T4 as she has reported recent worsening of fatigue

## 2023-09-13 NOTE — Assessment & Plan Note (Signed)
Followed by Dr. Hawkes Has multiple complications - ILD, pulmonary HTN, esophageal dysphagia and recently SBO 

## 2023-09-13 NOTE — Patient Instructions (Signed)
 Please start taking Olmesartan  40 mg once daily.  Please continue taking magnesium  supplement.  Please continue taking other medications as prescribed.  Please follow low salt diet and ambulate as tolerated.

## 2023-09-13 NOTE — Progress Notes (Signed)
 Established Patient Office Visit  Subjective:  Patient ID: Karen [name], female    DOB: 11-Dec-1970  Age: 53 y.o. MRN: [id number]  CC:  Chief Complaint  Patient presents with   Medical Management of Chronic Issues    6 month f/u, pt reports sx of feeling tired.     HPI Karen [name] is a 53 y.o. female with past medical history of HTN, scleroderma, ILD, pulmonary hypertension, dysphagia, hypothyroidism, chronic constipation and hot flashes who presents for f/u of her chronic medical conditions.  She has history of hypothyroidism, for which she takes NP thyroid . She currently denies any tremors, palpitations, or recent change in weight or appetite.    Essential hypertension and pulmonary hypertension: Her blood pressure is significantly elevated today. She takes olmesartan  20 mg QD, Zebeta  5 mg QD and spironolactone  25 mg BID.  Olmesartan  was recently added by heart failure clinic due to uncontrolled HTN.  She had to stop amlodipine  in the past due to worsening of leg swelling.  She has history of pulmonary hypertension from ILD, for which she takes spironolactone  and Lasix  currently. She is also on Opsumit . She follows up with CHF clinic.  She reports recent worsening of dyspnea, and attributes it to more walking/exertion at her job.  She has chronic, intermittent leg swelling.  She reports chronic cough, but denies any fever, chills, or recent worsening of dyspnea.  She has chronic nasal congestion and reports history of environmental allergies.  She has tried multiple antihistaminics including Zyrtec, Xyzal, etc.   She has history of scleroderma, for which she sees Dr. Meredith Stalls. She is on Cellcept  now.  She sees Dr. 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  She is on Ofev  for it.  She had steroid injection for cervical neck pain.  She has noticed improvement in neck pain and radiating pain in the LUE.  She still has numbness of the left thumb.  Past Medical History:  Diagnosis Date    Acute respiratory failure with hypoxia due to flash pulmonary edema requiring intubation 02/13/2020   Calculus of gallbladder with acute cholecystitis without obstruction    CHF (congestive heart failure) (HCC)    Gastroparesis    GERD (gastroesophageal reflux disease)    HTN (hypertension) 03/23/2017   Hypothyroidism    ILD (interstitial lung disease) (HCC) 02/08/2018   Multinodular thyroid     benign FNA 08/2017.   Perimenopause 02/27/2019   Pulmonary edema    Pulmonary hypertension (HCC) 08/04/2021   Raynaud's disease 11/10/2021   Scleroderma (HCC)    Sjogren's syndrome (HCC) 08/30/2017   Status post laparoscopic cholecystectomy 02/13/20 02/13/2020   Vitamin D  deficiency disease 02/27/2019    Past Surgical History:  Procedure Laterality Date   ABDOMINAL HERNIA REPAIR     BIOPSY OF SKIN SUBCUTANEOUS TISSUE AND/OR MUCOUS MEMBRANE  08/02/2023   Procedure: BIOPSY, SKIN, SUBCUTANEOUS TISSUE, OR MUCOUS MEMBRANE;  Surgeon: Ace Holder, MD;  Location: [facility];  Service: Gastroenterology;;   Tillman Folks Bilateral 10/2018   CHOLECYSTECTOMY N/A 02/13/2020   Procedure: LAPAROSCOPIC CHOLECYSTECTOMY;  Surgeon: Alanda Allegra, MD;  Location: [facility];  Service: General;  Laterality: N/A;   COLONOSCOPY WITH PROPOFOL  N/A 01/02/2019   Procedure: COLONOSCOPY WITH PROPOFOL ;  Surgeon: Suzette Espy, MD;  Location: [facility];  Service: Endoscopy;  Laterality: N/A;  12:30pm   ESOPHAGOGASTRODUODENOSCOPY (EGD) WITH PROPOFOL  N/A 01/28/2018   erosive reflux esophagitis, patulous EG Junction, no dilation, incomplete EGD due to retained food in stomach. GES thereafter with delayed gastric  emptying.    ESOPHAGOGASTRODUODENOSCOPY (EGD) WITH PROPOFOL  N/A 08/02/2023   Procedure: ESOPHAGOGASTRODUODENOSCOPY (EGD) WITH PROPOFOL ;  Surgeon: Ace Holder, MD;  Location: [facility];  Service: Gastroenterology;  Laterality: N/A;   RIGHT HEART CATH N/A 09/27/2017   Procedure: RIGHT HEART CATH;   Surgeon: Mardell Shade, MD;  Location: [facility];  Service: Cardiovascular;  Laterality: N/A;   RIGHT HEART CATH N/A 09/05/2019   Procedure: RIGHT HEART CATH;  Surgeon: Mardell Shade, MD;  Location: [facility];  Service: Cardiovascular;  Laterality: N/A;   RIGHT/LEFT HEART CATH AND CORONARY ANGIOGRAPHY N/A 08/18/2021   Procedure: RIGHT/LEFT HEART CATH AND CORONARY ANGIOGRAPHY;  Surgeon: Mardell Shade, MD;  Location: [facility];  Service: Cardiovascular;  Laterality: N/A;   UTERINE FIBROID SURGERY      Family History  Problem Relation Age of Onset   Hypertension Father    Hypertension Sister    Hypertension Brother    Diabetes Maternal Grandfather    Diabetes Maternal Uncle    Diabetes Mother    Colon cancer Neg Hx     Social History   Socioeconomic History   Marital status: Single    Spouse name: Not on file   Number of children: 0   Years of education: Not on file   Highest education level: Associate degree: occupational, Scientist, product/process development, or vocational program  Occupational History   Occupation: CMA    Employer: [employer]   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 She lives alone in one level home, a few steps to enter.CMA [employer].   Social Drivers of Corporate investment banker Strain: Not on file  Food Insecurity: No Food Insecurity (02/04/2023)   Hunger Vital Sign    Worried About Running Out of Food in the Last Year: Never true    Ran Out of Food in the Last Year: Never true  Transportation Needs: No Transportation Needs (02/04/2023)   PRAPARE - Administrator, Civil Service (Medical): No    Lack of Transportation (Non-Medical): No   Physical Activity: Not on file  Stress: Not on file  Social Connections: Not on file  Intimate Partner Violence: Not At Risk (02/04/2023)   Humiliation, Afraid, Rape, and Kick questionnaire    Fear of Current or Ex-Partner: No    Emotionally Abused: No    Physically Abused: No    Sexually Abused: No    Outpatient Medications Prior to Visit  Medication Sig Dispense Refill   acetaminophen  (TYLENOL ) 325 MG tablet Take 2 tablets (650 mg total) by mouth every 6 (six) hours as needed for mild pain, fever or headache (or Fever >/= 101). (Patient taking differently: Take 325-650 mg by mouth as needed for mild pain (pain score 1-3), fever or headache (or Fever >/= 101).) 12 tablet 0   bisoprolol  (ZEBETA ) 5 MG tablet Take 1 tablet (5 mg total) by mouth daily. 90 tablet 3   furosemide  (LASIX ) 40 MG tablet Take 1 tablet (40 mg total) by mouth 3 (three) times a week. Mon, Wed, Fri (Patient taking differently: Take 40-80 mg by mouth daily as needed for fluid.) 30 tablet 6  macitentan  (OPSUMIT ) 10 MG tablet Take 1 tablet (10 mg total) by mouth daily. 30 tablet 11   mycophenolate  (CELLCEPT ) 200 MG/ML suspension Take 7.5 mLs (1,500 mg total) by mouth 2 (two) times daily. 480 mL 5   Nintedanib  (OFEV ) 150 MG CAPS Take 1 capsule (150 mg total) by mouth 2 (two) times daily. 60 capsule 5   NP THYROID  120 MG tablet Take 1 tablet (120 mg total) by mouth daily before breakfast. 90 tablet 0   potassium chloride  20 MEQ/15ML (10%) SOLN Take 15 mLs (20 mEq total) by mouth daily with furosemide  (Patient taking differently: Take 20 mEq by mouth daily as needed (Lasix  use).) 450 mL 3   Prucalopride Succinate  (MOTEGRITY ) 2 MG TABS Take 1 tablet (2 mg total) by mouth daily. 90 tablet 1   spironolactone  (ALDACTONE ) 25 MG tablet Take 1 tablet (25 mg total) by mouth 2 (two) times daily. 180 tablet 1   sulfamethoxazole -trimethoprim  (BACTRIM  DS) 800-160 MG tablet Take 1 tablet by mouth 3 (three) times a week. 36 tablet 3    Vonoprazan Fumarate  (VOQUEZNA ) 10 MG TABS Take 1 tablet by mouth daily. 30 tablet 6   VOQUEZNA  20 MG TABS Take 20 mg by mouth daily. Lot: [id number], exp: 12-2023 10 tablet 0   cephALEXin  (KEFLEX ) 500 MG capsule Take 1 capsule (500 mg total) by mouth 4 (four) times daily. 20 capsule 0   olmesartan  (BENICAR ) 20 MG tablet Take 1 tablet (20 mg total) by mouth daily. 90 tablet 3   No facility-administered medications prior to visit.    Allergies  Allergen Reactions   Lisinopril Hives, Swelling and Other (See Comments)   Tocilizumab  Other (See Comments)    Swelling and shortness of breath sharpe pains in back and chest tightness.    ROS Review of Systems  Constitutional:  Positive for fatigue and unexpected weight change. Negative for chills and fever.  HENT:  Negative for congestion, sinus pressure, sinus pain and sore throat.   Eyes:  Negative for pain and discharge.  Respiratory:  Positive for cough and shortness of breath (intermittent).   Cardiovascular:  Negative for chest pain and palpitations.  Gastrointestinal:  Positive for constipation. Negative for abdominal pain, nausea and vomiting.  Endocrine: Negative for polydipsia and polyuria.  Genitourinary:  Negative for dysuria and hematuria.  Musculoskeletal:  Positive for arthralgias and neck pain. Negative for neck stiffness.  Skin:  Negative for rash.  Allergic/Immunologic: Positive for environmental allergies.  Neurological:  Positive for numbness (LUE). Negative for dizziness and weakness.  Psychiatric/Behavioral:  Negative for agitation and behavioral problems.       Objective:    Physical Exam Vitals reviewed.  Constitutional:      General: She is not in acute distress.    Appearance: She is not diaphoretic.  HENT:     Head: Normocephalic and atraumatic.     Nose: No congestion.     Mouth/Throat:     Mouth: Mucous membranes are moist.  Eyes:     General: No scleral icterus.    Extraocular Movements: Extraocular  movements intact.  Cardiovascular:     Rate and Rhythm: Normal rate and regular rhythm.     Heart sounds: Normal heart sounds. No murmur heard. Pulmonary:     Breath sounds: Normal breath sounds. No wheezing or rales.  Musculoskeletal:     Cervical back: Neck supple. Tenderness present.     Right lower leg: Edema (Mild) present.     Left lower leg: Edema (Mild) present.  Skin:    General: Skin is warm.     Findings: No rash.  Neurological:     General: No focal deficit present.     Mental Status: She is alert and oriented to person, place, and time.     Sensory: No sensory deficit.     Motor: No weakness.  Psychiatric:        Mood and Affect: Mood normal.        Behavior: Behavior normal.     BP (!) 164/90 (BP Location: Left Arm)   Pulse 83   Ht 5' 4.5" (1.638 m)   Wt 129 lb 6.4 oz (58.7 kg)   BMI 21.87 kg/m  Wt Readings from Last 3 Encounters:  09/13/23 129 lb 6.4 oz (58.7 kg)  08/09/23 131 lb (59.4 kg)  08/09/23 133 lb (60.3 kg)    Lab Results  Component Value Date   TSH 0.986 02/04/2023   Lab Results  Component Value Date   WBC 9.7 07/25/2023   HGB 11.8 (L) 07/25/2023   HCT 37.2 07/25/2023   MCV 85.9 07/25/2023   PLT 192 07/25/2023   Lab Results  Component Value Date   NA 141 07/25/2023   K 3.3 (L) 07/25/2023   CO2 28 07/25/2023   GLUCOSE 101 (H) 07/25/2023   BUN 7 07/25/2023   CREATININE 0.49 07/25/2023   BILITOT 0.8 07/25/2023   ALKPHOS 88 07/25/2023   AST 20 07/25/2023   ALT 10 07/25/2023   PROT 7.9 07/25/2023   ALBUMIN 4.7 07/25/2023   CALCIUM 9.5 07/25/2023   ANIONGAP 10 07/25/2023   EGFR 107 12/28/2022   GFR 87.51 11/20/2019   Lab Results  Component Value Date   CHOL 196 04/13/2022   Lab Results  Component Value Date   HDL 54 04/13/2022   Lab Results  Component Value Date   LDLCALC 125 (H) 04/13/2022   Lab Results  Component Value Date   TRIG 93 04/13/2022   Lab Results  Component Value Date   CHOLHDL 3.6 04/13/2022   Lab  Results  Component Value Date   HGBA1C 5.9 (H) 12/28/2022      Assessment & Plan:   Problem List Items Addressed This Visit       Cardiovascular and Mediastinum   HTN (hypertension) - Primary   BP Readings from Last 1 Encounters:  09/13/23 (!) 164/90   Uncontrolled with Zebeta  5 mg once daily, Olmesartan  20 mg QD and spironolactone  25 mg BID Increased dose of olmesartan  to 40 mg QD Since she had leg swelling even with half tablet of amlodipine , she stopped taking it Counseled for compliance with the medications Advised DASH diet and moderate exercise/walking, at least 150 mins/week      Relevant Medications   olmesartan  (BENICAR ) 40 MG tablet   Other Relevant Orders   CMP14+EGFR   Pulmonary hypertension (HCC)   Likely from ILD due to scleroderma Followed by CHF clinic Appears euvolemic currently, on spironolactone  and as needed Lasix  currently On Opsumit       Relevant Medications   olmesartan  (BENICAR ) 40 MG tablet     Digestive   Chronic GERD   On Voquezna  for GERD, cough could be due to GERD as well Avoid hot and spicy food Has tried omeprazole, pantoprazole  and Nexium  in the past Followed by GI        Endocrine   Hypothyroidism, adult   Lab Results  Component Value Date   TSH 0.986 02/04/2023   On NP thyroid  120 mg  QD -needs to take it in a.m. on empty stomach Check TSH and free T4 as she has reported recent worsening of fatigue      Relevant Orders   TSH + free T4     Other   Scleroderma (HCC) (Chronic)   Followed by Dr. Meredith Stalls Has multiple complications - ILD, pulmonary HTN, esophageal dysphagia and recently SBO      Mild protein-calorie malnutrition (HCC)   BMI Readings from Last 3 Encounters:  09/13/23 21.87 kg/m  08/09/23 22.14 kg/m  08/09/23 22.48 kg/m   Has lost 11 lbs since the last visit, with muscle wasting Has been losing weight lately, unintentional Check TSH and free T4 Started mirtazapine 7.5 mg QD to improve  appetite Advised to take protein supplements      Relevant Medications   mirtazapine (REMERON) 7.5 MG tablet   Hypomagnesemia   Her fatigue could be due to hypomagnesemia/hypokalemia Recent ER visit showed significantly low magnesium , requiring IV magnesium  administration Check Mg, CMP Needs to continue oral magnesium  supplement      Relevant Orders   Magnesium    Other Visit Diagnoses       Dysuria       Relevant Orders   UA/M w/rflx Culture, Routine        Meds ordered this encounter  Medications   olmesartan  (BENICAR ) 40 MG tablet    Sig: Take 1 tablet (40 mg total) by mouth daily.    Dispense:  90 tablet    Refill:  1   mirtazapine (REMERON) 7.5 MG tablet    Sig: Take 1 tablet (7.5 mg total) by mouth at bedtime.    Dispense:  30 tablet    Refill:  3    Follow-up: Return in about 2 months (around 11/13/2023) for HTN.    Meldon Sport, MD

## 2023-09-13 NOTE — Assessment & Plan Note (Signed)
 On Voquezna  for GERD, cough could be due to GERD as well Avoid hot and spicy food Has tried omeprazole, pantoprazole  and Nexium  in the past Followed by GI

## 2023-09-13 NOTE — Assessment & Plan Note (Addendum)
 BP Readings from Last 1 Encounters:  09/13/23 (!) 164/90   Uncontrolled with Zebeta  5 mg once daily, Olmesartan  20 mg QD and spironolactone  25 mg BID Increased dose of olmesartan  to 40 mg QD Since she had leg swelling even with half tablet of amlodipine , she stopped taking it Counseled for compliance with the medications Advised DASH diet and moderate exercise/walking, at least 150 mins/week

## 2023-09-13 NOTE — Assessment & Plan Note (Addendum)
 Her fatigue could be due to hypomagnesemia/hypokalemia Recent ER visit showed significantly low magnesium , requiring IV magnesium  administration Check Mg, CMP Needs to continue oral magnesium  supplement

## 2023-09-13 NOTE — Assessment & Plan Note (Addendum)
 BMI Readings from Last 3 Encounters:  09/13/23 21.87 kg/m  08/09/23 22.14 kg/m  08/09/23 22.48 kg/m   Has lost 11 lbs since the last visit, with muscle wasting Has been losing weight lately, unintentional Check TSH and free T4 Started mirtazapine 7.5 mg QD to improve appetite Advised to take protein supplements

## 2023-09-13 NOTE — Assessment & Plan Note (Signed)
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

## 2023-09-14 ENCOUNTER — Other Ambulatory Visit (HOSPITAL_COMMUNITY): Payer: Self-pay

## 2023-09-14 DIAGNOSIS — R300 Dysuria: Secondary | ICD-10-CM | POA: Diagnosis not present

## 2023-09-15 LAB — CMP14+EGFR
ALT: 11 IU/L (ref 0–32)
AST: 20 IU/L (ref 0–40)
Albumin: 4.7 g/dL (ref 3.8–4.9)
Alkaline Phosphatase: 115 IU/L (ref 44–121)
BUN/Creatinine Ratio: 23 (ref 9–23)
BUN: 19 mg/dL (ref 6–24)
Bilirubin Total: 0.4 mg/dL (ref 0.0–1.2)
CO2: 21 mmol/L (ref 20–29)
Calcium: 10.9 mg/dL — ABNORMAL HIGH (ref 8.7–10.2)
Chloride: 102 mmol/L (ref 96–106)
Creatinine, Ser: 0.84 mg/dL (ref 0.57–1.00)
Globulin, Total: 3.3 g/dL (ref 1.5–4.5)
Glucose: 86 mg/dL (ref 70–99)
Potassium: 4.3 mmol/L (ref 3.5–5.2)
Sodium: 141 mmol/L (ref 134–144)
Total Protein: 8 g/dL (ref 6.0–8.5)
eGFR: 82 mL/min/{1.73_m2} (ref >59)

## 2023-09-15 LAB — TSH+FREE T4
Free T4: 1.31 ng/dL (ref 0.82–1.77)
TSH: 1.7 u[IU]/mL (ref 0.450–4.500)

## 2023-09-15 LAB — MAGNESIUM: Magnesium: 1.6 mg/dL (ref 1.6–2.3)

## 2023-09-17 ENCOUNTER — Other Ambulatory Visit (HOSPITAL_COMMUNITY): Payer: Self-pay

## 2023-09-17 ENCOUNTER — Encounter: Payer: Self-pay | Admitting: Internal Medicine

## 2023-09-17 MED ORDER — NITROFURANTOIN MONOHYD MACRO 100 MG PO CAPS
100.0000 mg | ORAL_CAPSULE | Freq: Two times a day (BID) | ORAL | 0 refills | Status: DC
Start: 2023-09-17 — End: 2023-09-19
  Filled 2023-09-17 (×2): qty 10, 5d supply, fill #0

## 2023-09-17 NOTE — Addendum Note (Signed)
 Addended byCleola Dach on: 09/17/2023 08:16 AM   Modules accepted: Orders

## 2023-09-19 ENCOUNTER — Other Ambulatory Visit: Payer: Self-pay | Admitting: Internal Medicine

## 2023-09-19 ENCOUNTER — Ambulatory Visit: Payer: Self-pay | Admitting: Internal Medicine

## 2023-09-19 ENCOUNTER — Encounter (HOSPITAL_COMMUNITY): Payer: Self-pay

## 2023-09-19 ENCOUNTER — Other Ambulatory Visit (HOSPITAL_COMMUNITY): Payer: Self-pay

## 2023-09-19 DIAGNOSIS — N3000 Acute cystitis without hematuria: Secondary | ICD-10-CM

## 2023-09-19 DIAGNOSIS — J849 Interstitial pulmonary disease, unspecified: Secondary | ICD-10-CM

## 2023-09-19 LAB — URINE CULTURE, REFLEX

## 2023-09-19 LAB — UA/M W/RFLX CULTURE, ROUTINE
Bilirubin, UA: NEGATIVE
Glucose, UA: NEGATIVE
Ketones, UA: NEGATIVE
Nitrite, UA: NEGATIVE
Specific Gravity, UA: 1.013 (ref 1.005–1.030)
Urobilinogen, Ur: 0.2 mg/dL (ref 0.2–1.0)
pH, UA: 6 (ref 5.0–7.5)

## 2023-09-19 LAB — MICROSCOPIC EXAMINATION
Casts: NONE SEEN /LPF
Epithelial Cells (non renal): NONE SEEN /HPF (ref 0–10)
WBC, UA: >30 /HPF — AB (ref 0–5)

## 2023-09-19 MED ORDER — CEFPODOXIME PROXETIL 100 MG PO TABS
100.0000 mg | ORAL_TABLET | Freq: Two times a day (BID) | ORAL | 0 refills | Status: DC
  Filled 2023-09-19: qty 10, 5d supply, fill #0

## 2023-09-19 NOTE — Telephone Encounter (Signed)
 Medication refill request- outside provider Rx

## 2023-09-20 ENCOUNTER — Other Ambulatory Visit (HOSPITAL_COMMUNITY): Payer: Self-pay

## 2023-09-27 ENCOUNTER — Telehealth: Payer: Self-pay | Admitting: Neurology

## 2023-09-27 NOTE — Telephone Encounter (Signed)
 r/s appointment due to a conflict

## 2023-09-28 ENCOUNTER — Encounter: Admitting: Neurology

## 2023-10-04 DIAGNOSIS — K219 Gastro-esophageal reflux disease without esophagitis: Secondary | ICD-10-CM | POA: Diagnosis not present

## 2023-10-04 DIAGNOSIS — R918 Other nonspecific abnormal finding of lung field: Secondary | ICD-10-CM | POA: Diagnosis not present

## 2023-10-04 DIAGNOSIS — K30 Functional dyspepsia: Secondary | ICD-10-CM | POA: Diagnosis not present

## 2023-10-04 DIAGNOSIS — Z7682 Awaiting organ transplant status: Secondary | ICD-10-CM | POA: Diagnosis not present

## 2023-10-04 DIAGNOSIS — Z01818 Encounter for other preprocedural examination: Secondary | ICD-10-CM | POA: Diagnosis not present

## 2023-10-04 DIAGNOSIS — R0602 Shortness of breath: Secondary | ICD-10-CM | POA: Diagnosis not present

## 2023-10-04 DIAGNOSIS — M349 Systemic sclerosis, unspecified: Secondary | ICD-10-CM | POA: Diagnosis not present

## 2023-10-04 DIAGNOSIS — J849 Interstitial pulmonary disease, unspecified: Secondary | ICD-10-CM | POA: Diagnosis not present

## 2023-10-04 DIAGNOSIS — E042 Nontoxic multinodular goiter: Secondary | ICD-10-CM | POA: Diagnosis not present

## 2023-10-04 DIAGNOSIS — K224 Dyskinesia of esophagus: Secondary | ICD-10-CM | POA: Diagnosis not present

## 2023-10-05 ENCOUNTER — Telehealth: Payer: Self-pay | Admitting: Internal Medicine

## 2023-10-05 DIAGNOSIS — R112 Nausea with vomiting, unspecified: Secondary | ICD-10-CM | POA: Diagnosis not present

## 2023-10-05 DIAGNOSIS — I10 Essential (primary) hypertension: Secondary | ICD-10-CM | POA: Diagnosis not present

## 2023-10-05 DIAGNOSIS — R58 Hemorrhage, not elsewhere classified: Secondary | ICD-10-CM | POA: Diagnosis not present

## 2023-10-05 NOTE — Telephone Encounter (Signed)
 FMLA given to Dr Lydia Sams for review and signature

## 2023-10-05 NOTE — Telephone Encounter (Signed)
 FMLA forms  Noted Copied Sleeved  Original copy placed in provider box  Copy put at front desk in folder

## 2023-10-08 ENCOUNTER — Other Ambulatory Visit: Payer: Self-pay

## 2023-10-08 NOTE — Telephone Encounter (Signed)
 Placed in yosseline box for faxing

## 2023-10-08 NOTE — Telephone Encounter (Signed)
 Faxed to [employer] at [phone] with confirmation. Copy sent to media

## 2023-10-26 ENCOUNTER — Other Ambulatory Visit (HOSPITAL_COMMUNITY): Payer: Self-pay

## 2023-10-29 ENCOUNTER — Other Ambulatory Visit (HOSPITAL_COMMUNITY): Payer: Self-pay

## 2023-10-29 ENCOUNTER — Other Ambulatory Visit: Payer: Self-pay

## 2023-10-29 ENCOUNTER — Telehealth: Payer: Self-pay

## 2023-10-29 NOTE — Telephone Encounter (Signed)
 Pharmacy Patient Advocate Encounter   Received notification from CoverMyMeds that prior authorization for Voquezna  10MG  tablets is required/requested.   Insurance verification completed.   The patient is insured through [facility] .   Per test claim: PA required; PA submitted to above mentioned insurance via CoverMyMeds Key/confirmation #/EOC [id number] Status is pending

## 2023-10-29 NOTE — Telephone Encounter (Signed)
 Pharmacy Patient Advocate Encounter  Received notification from [facility] that Prior Authorization for Voquezna  10MG  tablets has been APPROVED from 10-29-2023 to 11-26-2023   PA #/Case ID/Reference #: [id number]

## 2023-10-30 ENCOUNTER — Other Ambulatory Visit (HOSPITAL_COMMUNITY): Payer: Self-pay

## 2023-10-30 ENCOUNTER — Other Ambulatory Visit: Payer: Self-pay

## 2023-11-01 DIAGNOSIS — I7300 Raynaud's syndrome without gangrene: Secondary | ICD-10-CM | POA: Diagnosis not present

## 2023-11-01 DIAGNOSIS — M349 Systemic sclerosis, unspecified: Secondary | ICD-10-CM | POA: Diagnosis not present

## 2023-11-01 DIAGNOSIS — M5412 Radiculopathy, cervical region: Secondary | ICD-10-CM | POA: Diagnosis not present

## 2023-11-01 DIAGNOSIS — J849 Interstitial pulmonary disease, unspecified: Secondary | ICD-10-CM | POA: Diagnosis not present

## 2023-11-01 DIAGNOSIS — M1991 Primary osteoarthritis, unspecified site: Secondary | ICD-10-CM | POA: Diagnosis not present

## 2023-11-01 DIAGNOSIS — Z6821 Body mass index (BMI) 21.0-21.9, adult: Secondary | ICD-10-CM | POA: Diagnosis not present

## 2023-11-01 DIAGNOSIS — I272 Pulmonary hypertension, unspecified: Secondary | ICD-10-CM | POA: Diagnosis not present

## 2023-11-28 ENCOUNTER — Encounter: Admitting: Neurology

## 2023-11-28 ENCOUNTER — Encounter: Payer: Self-pay | Admitting: Neurology

## 2023-12-13 DIAGNOSIS — Z1231 Encounter for screening mammogram for malignant neoplasm of breast: Secondary | ICD-10-CM | POA: Diagnosis not present

## 2023-12-24 ENCOUNTER — Other Ambulatory Visit: Payer: Self-pay | Admitting: Internal Medicine

## 2023-12-24 DIAGNOSIS — G459 Transient cerebral ischemic attack, unspecified: Secondary | ICD-10-CM | POA: Diagnosis not present

## 2023-12-24 DIAGNOSIS — E042 Nontoxic multinodular goiter: Secondary | ICD-10-CM | POA: Diagnosis not present

## 2023-12-24 DIAGNOSIS — R29810 Facial weakness: Secondary | ICD-10-CM | POA: Diagnosis not present

## 2023-12-24 DIAGNOSIS — E039 Hypothyroidism, unspecified: Secondary | ICD-10-CM

## 2023-12-24 DIAGNOSIS — N390 Urinary tract infection, site not specified: Secondary | ICD-10-CM | POA: Diagnosis not present

## 2023-12-24 DIAGNOSIS — R0989 Other specified symptoms and signs involving the circulatory and respiratory systems: Secondary | ICD-10-CM | POA: Diagnosis not present

## 2023-12-24 DIAGNOSIS — R4182 Altered mental status, unspecified: Secondary | ICD-10-CM | POA: Diagnosis not present

## 2023-12-24 DIAGNOSIS — R918 Other nonspecific abnormal finding of lung field: Secondary | ICD-10-CM | POA: Diagnosis not present

## 2023-12-24 DIAGNOSIS — I6523 Occlusion and stenosis of bilateral carotid arteries: Secondary | ICD-10-CM | POA: Diagnosis not present

## 2023-12-24 DIAGNOSIS — R519 Headache, unspecified: Secondary | ICD-10-CM | POA: Diagnosis not present

## 2023-12-24 DIAGNOSIS — R410 Disorientation, unspecified: Secondary | ICD-10-CM | POA: Diagnosis not present

## 2023-12-24 DIAGNOSIS — S3993XA Unspecified injury of pelvis, initial encounter: Secondary | ICD-10-CM | POA: Diagnosis not present

## 2023-12-25 ENCOUNTER — Other Ambulatory Visit (HOSPITAL_COMMUNITY): Payer: Self-pay

## 2023-12-25 ENCOUNTER — Other Ambulatory Visit: Payer: Self-pay | Admitting: Gastroenterology

## 2023-12-25 DIAGNOSIS — E042 Nontoxic multinodular goiter: Secondary | ICD-10-CM | POA: Diagnosis not present

## 2023-12-25 DIAGNOSIS — I270 Primary pulmonary hypertension: Secondary | ICD-10-CM | POA: Diagnosis not present

## 2023-12-25 DIAGNOSIS — R4781 Slurred speech: Secondary | ICD-10-CM | POA: Diagnosis not present

## 2023-12-25 DIAGNOSIS — Z8679 Personal history of other diseases of the circulatory system: Secondary | ICD-10-CM | POA: Diagnosis not present

## 2023-12-25 DIAGNOSIS — R0989 Other specified symptoms and signs involving the circulatory and respiratory systems: Secondary | ICD-10-CM | POA: Diagnosis not present

## 2023-12-25 DIAGNOSIS — E162 Hypoglycemia, unspecified: Secondary | ICD-10-CM | POA: Diagnosis not present

## 2023-12-25 DIAGNOSIS — R109 Unspecified abdominal pain: Secondary | ICD-10-CM | POA: Diagnosis not present

## 2023-12-25 DIAGNOSIS — I5032 Chronic diastolic (congestive) heart failure: Secondary | ICD-10-CM | POA: Diagnosis not present

## 2023-12-25 DIAGNOSIS — N390 Urinary tract infection, site not specified: Secondary | ICD-10-CM | POA: Diagnosis not present

## 2023-12-25 DIAGNOSIS — Z792 Long term (current) use of antibiotics: Secondary | ICD-10-CM | POA: Diagnosis not present

## 2023-12-25 DIAGNOSIS — R0789 Other chest pain: Secondary | ICD-10-CM | POA: Diagnosis not present

## 2023-12-25 DIAGNOSIS — I351 Nonrheumatic aortic (valve) insufficiency: Secondary | ICD-10-CM | POA: Diagnosis not present

## 2023-12-25 DIAGNOSIS — R7989 Other specified abnormal findings of blood chemistry: Secondary | ICD-10-CM | POA: Diagnosis not present

## 2023-12-25 DIAGNOSIS — I110 Hypertensive heart disease with heart failure: Secondary | ICD-10-CM | POA: Diagnosis not present

## 2023-12-25 DIAGNOSIS — I639 Cerebral infarction, unspecified: Secondary | ICD-10-CM | POA: Diagnosis not present

## 2023-12-25 DIAGNOSIS — Z79899 Other long term (current) drug therapy: Secondary | ICD-10-CM | POA: Diagnosis not present

## 2023-12-25 DIAGNOSIS — I160 Hypertensive urgency: Secondary | ICD-10-CM | POA: Diagnosis not present

## 2023-12-25 DIAGNOSIS — E785 Hyperlipidemia, unspecified: Secondary | ICD-10-CM | POA: Diagnosis not present

## 2023-12-25 DIAGNOSIS — I6523 Occlusion and stenosis of bilateral carotid arteries: Secondary | ICD-10-CM | POA: Diagnosis not present

## 2023-12-25 DIAGNOSIS — G459 Transient cerebral ischemic attack, unspecified: Secondary | ICD-10-CM | POA: Diagnosis not present

## 2023-12-25 DIAGNOSIS — S098XXA Other specified injuries of head, initial encounter: Secondary | ICD-10-CM | POA: Diagnosis not present

## 2023-12-25 DIAGNOSIS — M3509 Sicca syndrome with other organ involvement: Secondary | ICD-10-CM | POA: Diagnosis not present

## 2023-12-25 DIAGNOSIS — M349 Systemic sclerosis, unspecified: Secondary | ICD-10-CM | POA: Diagnosis not present

## 2023-12-25 DIAGNOSIS — R531 Weakness: Secondary | ICD-10-CM | POA: Diagnosis not present

## 2023-12-25 DIAGNOSIS — K3184 Gastroparesis: Secondary | ICD-10-CM | POA: Diagnosis not present

## 2023-12-25 DIAGNOSIS — R4182 Altered mental status, unspecified: Secondary | ICD-10-CM | POA: Diagnosis not present

## 2023-12-25 DIAGNOSIS — I10 Essential (primary) hypertension: Secondary | ICD-10-CM | POA: Diagnosis not present

## 2023-12-25 DIAGNOSIS — S3993XA Unspecified injury of pelvis, initial encounter: Secondary | ICD-10-CM | POA: Diagnosis not present

## 2023-12-25 DIAGNOSIS — I272 Pulmonary hypertension, unspecified: Secondary | ICD-10-CM | POA: Diagnosis not present

## 2023-12-25 DIAGNOSIS — J849 Interstitial pulmonary disease, unspecified: Secondary | ICD-10-CM | POA: Diagnosis not present

## 2023-12-25 DIAGNOSIS — K224 Dyskinesia of esophagus: Secondary | ICD-10-CM | POA: Diagnosis not present

## 2023-12-25 DIAGNOSIS — R410 Disorientation, unspecified: Secondary | ICD-10-CM | POA: Diagnosis not present

## 2023-12-25 DIAGNOSIS — K219 Gastro-esophageal reflux disease without esophagitis: Secondary | ICD-10-CM | POA: Diagnosis not present

## 2023-12-25 DIAGNOSIS — R918 Other nonspecific abnormal finding of lung field: Secondary | ICD-10-CM | POA: Diagnosis not present

## 2023-12-25 DIAGNOSIS — E039 Hypothyroidism, unspecified: Secondary | ICD-10-CM | POA: Diagnosis not present

## 2023-12-25 DIAGNOSIS — R519 Headache, unspecified: Secondary | ICD-10-CM | POA: Diagnosis not present

## 2023-12-25 DIAGNOSIS — K5900 Constipation, unspecified: Secondary | ICD-10-CM | POA: Diagnosis not present

## 2023-12-25 MED ORDER — NP THYROID 120 MG PO TABS
120.0000 mg | ORAL_TABLET | Freq: Every day | ORAL | 0 refills | Status: DC
  Filled 2023-12-25: qty 90, 90d supply, fill #0

## 2023-12-26 ENCOUNTER — Telehealth: Payer: Self-pay | Admitting: Internal Medicine

## 2023-12-26 ENCOUNTER — Other Ambulatory Visit: Payer: Self-pay

## 2023-12-26 DIAGNOSIS — N390 Urinary tract infection, site not specified: Secondary | ICD-10-CM | POA: Diagnosis not present

## 2023-12-26 DIAGNOSIS — R109 Unspecified abdominal pain: Secondary | ICD-10-CM | POA: Diagnosis not present

## 2023-12-26 DIAGNOSIS — R531 Weakness: Secondary | ICD-10-CM | POA: Diagnosis not present

## 2023-12-26 DIAGNOSIS — K5900 Constipation, unspecified: Secondary | ICD-10-CM | POA: Diagnosis not present

## 2023-12-26 DIAGNOSIS — E162 Hypoglycemia, unspecified: Secondary | ICD-10-CM | POA: Diagnosis not present

## 2023-12-26 DIAGNOSIS — K219 Gastro-esophageal reflux disease without esophagitis: Secondary | ICD-10-CM | POA: Diagnosis not present

## 2023-12-26 DIAGNOSIS — E785 Hyperlipidemia, unspecified: Secondary | ICD-10-CM | POA: Diagnosis not present

## 2023-12-26 DIAGNOSIS — I272 Pulmonary hypertension, unspecified: Secondary | ICD-10-CM | POA: Diagnosis not present

## 2023-12-26 DIAGNOSIS — R4781 Slurred speech: Secondary | ICD-10-CM | POA: Diagnosis not present

## 2023-12-26 DIAGNOSIS — E039 Hypothyroidism, unspecified: Secondary | ICD-10-CM | POA: Diagnosis not present

## 2023-12-26 DIAGNOSIS — I10 Essential (primary) hypertension: Secondary | ICD-10-CM | POA: Diagnosis not present

## 2023-12-26 NOTE — Telephone Encounter (Signed)
 That is a question for the insurance. It's more than likely a policy or a restriction they have. In the approval letter it does state that it's approved with a quantity restriction of 1 per day but that could also apply to the length they approve for. I would not know that answer though

## 2023-12-26 NOTE — Telephone Encounter (Signed)
**Note De-identified  [place] Obfuscation** Please advise 

## 2023-12-26 NOTE — Telephone Encounter (Signed)
 Copied from CRM #[id number]. Topic: Appointments - Scheduling Inquiry for Clinic >> Dec 26, 2023  9:15 AM Gustabo D wrote: Patient is being discharged from Hospital caller is - Kathlen  she is calling to setup a 1 week fu appointment. Please call patient back to setup appointment because there isn't anything available until September.

## 2023-12-26 NOTE — Telephone Encounter (Signed)
 Patient returned called, scheduled for Friday.

## 2023-12-26 NOTE — Telephone Encounter (Signed)
 Called patient left voicemail to call office to schedule the appointment.

## 2023-12-26 NOTE — Telephone Encounter (Signed)
 Copied from CRM [id number]. Topic: Clinical - Home Health Verbal Orders >> Dec 26, 2023  1:02 PM Rosaria BRAVO wrote: Augustin from [facility] called to report that the patient will need outpatient PT  Best contact: [phone] [facility].

## 2023-12-27 ENCOUNTER — Other Ambulatory Visit (HOSPITAL_COMMUNITY): Payer: Self-pay

## 2023-12-27 ENCOUNTER — Encounter (HOSPITAL_COMMUNITY): Payer: Self-pay

## 2023-12-27 ENCOUNTER — Telehealth: Payer: Self-pay

## 2023-12-27 DIAGNOSIS — Z79899 Other long term (current) drug therapy: Secondary | ICD-10-CM | POA: Diagnosis not present

## 2023-12-27 DIAGNOSIS — I639 Cerebral infarction, unspecified: Secondary | ICD-10-CM | POA: Diagnosis not present

## 2023-12-27 DIAGNOSIS — Z792 Long term (current) use of antibiotics: Secondary | ICD-10-CM | POA: Diagnosis not present

## 2023-12-27 DIAGNOSIS — R7989 Other specified abnormal findings of blood chemistry: Secondary | ICD-10-CM | POA: Diagnosis not present

## 2023-12-27 DIAGNOSIS — R410 Disorientation, unspecified: Secondary | ICD-10-CM | POA: Diagnosis not present

## 2023-12-27 MED ORDER — CEFPODOXIME PROXETIL 200 MG PO TABS
200.0000 mg | ORAL_TABLET | Freq: Two times a day (BID) | ORAL | 0 refills | Status: DC
  Filled 2023-12-27: qty 6, 3d supply, fill #0

## 2023-12-27 MED ORDER — ASPIRIN 81 MG PO CHEW
81.0000 mg | CHEWABLE_TABLET | Freq: Every day | ORAL | 0 refills | Status: AC
  Filled 2023-12-27: qty 30, 30d supply, fill #0

## 2023-12-27 NOTE — Telephone Encounter (Signed)
 Did you want me to try and submit another prior authorization?

## 2023-12-27 NOTE — Telephone Encounter (Signed)
 Pharmacy Patient Advocate Encounter   Received notification from RX Request Messages that prior authorization for Voquezna  10MG  tablets is required/requested.   Insurance verification completed.   The patient is insured through [facility] .   Per test claim: PA required; PA submitted to above mentioned insurance via Latent Key/confirmation #/EOC [id number] Status is pending

## 2023-12-27 NOTE — Telephone Encounter (Signed)
 PA request has been Submitted. New Encounter has been or will be created for follow up. For additional info see Pharmacy Prior Auth telephone encounter from 12-27-2023.

## 2023-12-28 ENCOUNTER — Ambulatory Visit (INDEPENDENT_AMBULATORY_CARE_PROVIDER_SITE_OTHER): Admitting: Internal Medicine

## 2023-12-28 ENCOUNTER — Encounter: Payer: Self-pay | Admitting: Internal Medicine

## 2023-12-28 ENCOUNTER — Other Ambulatory Visit (HOSPITAL_COMMUNITY): Payer: Self-pay

## 2023-12-28 VITALS — BP 146/62 | HR 108 | Ht 64.0 in | Wt 123.2 lb

## 2023-12-28 DIAGNOSIS — Z8673 Personal history of transient ischemic attack (TIA), and cerebral infarction without residual deficits: Secondary | ICD-10-CM

## 2023-12-28 DIAGNOSIS — I10 Essential (primary) hypertension: Secondary | ICD-10-CM | POA: Diagnosis not present

## 2023-12-28 DIAGNOSIS — J0110 Acute frontal sinusitis, unspecified: Secondary | ICD-10-CM | POA: Diagnosis not present

## 2023-12-28 DIAGNOSIS — F5101 Primary insomnia: Secondary | ICD-10-CM

## 2023-12-28 DIAGNOSIS — R5381 Other malaise: Secondary | ICD-10-CM | POA: Diagnosis not present

## 2023-12-28 DIAGNOSIS — K219 Gastro-esophageal reflux disease without esophagitis: Secondary | ICD-10-CM

## 2023-12-28 DIAGNOSIS — E039 Hypothyroidism, unspecified: Secondary | ICD-10-CM | POA: Diagnosis not present

## 2023-12-28 DIAGNOSIS — Z09 Encounter for follow-up examination after completed treatment for conditions other than malignant neoplasm: Secondary | ICD-10-CM | POA: Diagnosis not present

## 2023-12-28 HISTORY — DX: Personal history of transient ischemic attack (TIA), and cerebral infarction without residual deficits: Z86.73

## 2023-12-28 HISTORY — DX: Acute frontal sinusitis, unspecified: J01.10

## 2023-12-28 MED ORDER — TRAZODONE HCL 50 MG PO TABS
25.0000 mg | ORAL_TABLET | Freq: Every evening | ORAL | 3 refills | Status: AC | PRN
  Filled 2023-12-28: qty 30, 30d supply, fill #0

## 2023-12-28 MED ORDER — AZITHROMYCIN 250 MG PO TABS
ORAL_TABLET | ORAL | 0 refills | Status: AC
  Filled 2023-12-28: qty 6, 5d supply, fill #0

## 2023-12-28 NOTE — Telephone Encounter (Signed)
 Pharmacy Patient Advocate Encounter  Received notification from [facility] that Prior Authorization for  Voquezna  10MG  tablets has been APPROVED from 12-27-2023 to 02-20-2024   PA #/Case ID/Reference #: [id number]

## 2023-12-28 NOTE — Assessment & Plan Note (Signed)
 Hospital chart reviewed including discharge summary Although she was told of TIA, her symptoms are more suggestive of syncope rather than TIA Had episodes of hypoglycemia during hospitalization as well, needs to eat at regular intervals Medications reconciled and reviewed with the patient in detail

## 2023-12-28 NOTE — Assessment & Plan Note (Signed)
 Has tried mirtazapine , but did not tolerate it Started trazodone  as needed for insomnia

## 2023-12-28 NOTE — Assessment & Plan Note (Signed)
 Lab Results  Component Value Date   TSH 1.700 09/13/2023   On NP thyroid  120 mg QD -needs to take it in a.m. on empty stomach Check TSH and free T4 as she has reported recent worsening of fatigue

## 2023-12-28 NOTE — Assessment & Plan Note (Signed)
 On Voquezna  for GERD, needs to contact GI for refills Avoid hot and spicy food Has tried omeprazole, pantoprazole  and Nexium  in the past Followed by GI

## 2023-12-28 NOTE — Assessment & Plan Note (Signed)
 BP Readings from Last 1 Encounters:  12/28/23 (!) 146/62   Uncontrolled with Zebeta  5 mg once daily, Olmesartan  40 mg QD and spironolactone  25 mg BID Noncompliance is a big concern, would avoid changing any medication for now Since she had leg swelling even with half tablet of amlodipine , she stopped taking it Counseled for compliance with the medications Advised DASH diet and moderate exercise/walking, at least 150 mins/week

## 2023-12-28 NOTE — Patient Instructions (Addendum)
 Please start taking Trazodone  as prescribed.  Please continue taking Aspirin  81 mg once daily.  Please start taking Azithromycin  as prescribed.  Please continue to eat at regular intervals at least every 4 hours.
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
## 2023-12-28 NOTE — Assessment & Plan Note (Addendum)
 Stroke protocol was initiated at [facility] due to reports of facial drooping and drowsiness, but her symptoms are more suggestive of syncope CTA head was negative for any acute blockage or stenosis MRI brain was negative for acute cerebral infarction Advised to discuss with neurologist Continue aspirin  81 mg QD for now

## 2023-12-28 NOTE — Assessment & Plan Note (Signed)
 Started empiric azithromycin  Advised to use nasal saline spray as needed for nasal congestion Advised to use sinus inhaler or vaporizer as needed for nasal congestion

## 2023-12-31 ENCOUNTER — Other Ambulatory Visit (HOSPITAL_COMMUNITY): Payer: Self-pay

## 2023-12-31 ENCOUNTER — Encounter: Payer: Self-pay | Admitting: Internal Medicine

## 2023-12-31 ENCOUNTER — Other Ambulatory Visit (HOSPITAL_BASED_OUTPATIENT_CLINIC_OR_DEPARTMENT_OTHER): Payer: Self-pay

## 2023-12-31 ENCOUNTER — Other Ambulatory Visit: Payer: Self-pay | Admitting: Internal Medicine

## 2023-12-31 DIAGNOSIS — R5381 Other malaise: Secondary | ICD-10-CM | POA: Insufficient documentation

## 2023-12-31 DIAGNOSIS — E876 Hypokalemia: Secondary | ICD-10-CM

## 2023-12-31 MED ORDER — POTASSIUM CHLORIDE 20 MEQ/15ML (10%) PO SOLN
20.0000 meq | Freq: Every day | ORAL | 3 refills | Status: DC
  Filled 2023-12-31: qty 450, 30d supply, fill #0

## 2023-12-31 NOTE — Addendum Note (Signed)
 Addended byBETHA TOBIE DOWNS on: 12/31/2023 12:10 PM   Modules accepted: Orders

## 2023-12-31 NOTE — Assessment & Plan Note (Signed)
 Has recent worsening of fatigue and weakness Has multiple chronic medical conditions, with exertional dyspnea Referred to PT for strength training

## 2024-01-01 ENCOUNTER — Other Ambulatory Visit (HOSPITAL_COMMUNITY): Payer: Self-pay

## 2024-01-01 ENCOUNTER — Other Ambulatory Visit: Payer: Self-pay

## 2024-01-01 NOTE — Progress Notes (Signed)
 Pt has not filled medication since 06/2023. She has not been seen by our clinic since 04/20/2023 and is not currently scheduled for any future appointments at this time. Disenrolling due to inability to contact.

## 2024-01-02 ENCOUNTER — Other Ambulatory Visit (HOSPITAL_COMMUNITY): Payer: Self-pay

## 2024-01-02 ENCOUNTER — Telehealth: Payer: Self-pay | Admitting: Internal Medicine

## 2024-01-02 NOTE — Telephone Encounter (Signed)
 FMLA form Noted Copied Sleeved Original placed in provider box Copy placed front desk folder

## 2024-01-03 DIAGNOSIS — E039 Hypothyroidism, unspecified: Secondary | ICD-10-CM | POA: Diagnosis not present

## 2024-01-03 DIAGNOSIS — Z780 Asymptomatic menopausal state: Secondary | ICD-10-CM | POA: Diagnosis not present

## 2024-01-03 DIAGNOSIS — D259 Leiomyoma of uterus, unspecified: Secondary | ICD-10-CM | POA: Diagnosis not present

## 2024-01-03 DIAGNOSIS — M349 Systemic sclerosis, unspecified: Secondary | ICD-10-CM | POA: Diagnosis not present

## 2024-01-03 DIAGNOSIS — J849 Interstitial pulmonary disease, unspecified: Secondary | ICD-10-CM | POA: Diagnosis not present

## 2024-01-03 DIAGNOSIS — I10 Essential (primary) hypertension: Secondary | ICD-10-CM | POA: Diagnosis not present

## 2024-01-03 DIAGNOSIS — Z01419 Encounter for gynecological examination (general) (routine) without abnormal findings: Secondary | ICD-10-CM | POA: Diagnosis not present

## 2024-01-09 NOTE — Telephone Encounter (Signed)
 Pages are missing from the [employer] when faxed.  Needs the page that tells them the specifies regarding the leave so they can approve and process it.   Copied Noted Sleeved Original placed in provider box Copy placed in front desk folder  Fax when completed

## 2024-01-10 ENCOUNTER — Ambulatory Visit: Payer: Self-pay

## 2024-01-10 NOTE — Telephone Encounter (Signed)
 FYI Only or Action Required?: Action required by provider: clinical question for provider.  Patient was last seen in primary care on 12/28/2023 by Tobie Suzzane POUR, MD.  Called Nurse Triage reporting Advice Only.  Triage Disposition: Information or Advice Only Call  Patient/caregiver understands and will follow disposition?: No, wishes to speak with PCP    Copied from CRM #[id number]. Topic: Clinical - Red Word Triage >> Jan 10, 2024  3:05 PM Ivette P wrote: Red Word that prompted transfer to Nurse Triage:  still feeling sick. when going back to work. Issues with the blood pressure, stomach issues. head still hurts off and on. still weak. not in the shape to go to work. Reason for Disposition  Health information question, no triage required and triager able to answer question  Answer Assessment - Initial Assessment Questions 1. REASON FOR CALL: What is the main reason for your call? or How can I best help you?   Pt stated calling in today to follow up on her FMLA paperwork.  Pt would like to know if the forms have been filled out - pt states she thinks they are expecting her to go back to work tomorrow because no paperwork.. pt also states that she is feeling the exact same as before, headaches, weak, BP goes and down & abd issues.  Pt also states she would like to know when to follow up with PCP.  Pt requests a call back from PCP.  Protocols used: Information Only Call - No Triage-A-AH

## 2024-01-11 ENCOUNTER — Other Ambulatory Visit (HOSPITAL_COMMUNITY): Payer: Self-pay

## 2024-01-11 NOTE — Telephone Encounter (Signed)
 Forms received yesterday, forms corrected and completed , returned fax. Copy of forms at my desk if needed, originals sent to scan.

## 2024-01-22 ENCOUNTER — Encounter: Payer: Self-pay | Admitting: Internal Medicine
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
## 2024-01-23 ENCOUNTER — Telehealth: Payer: Self-pay | Admitting: Neurology

## 2024-01-23 ENCOUNTER — Encounter: Payer: Self-pay | Admitting: Neurology

## 2024-01-23 ENCOUNTER — Ambulatory Visit: Admitting: Neurology

## 2024-01-23 VITALS — BP 110/74 | Ht 65.0 in | Wt 128.0 lb

## 2024-01-23 DIAGNOSIS — M542 Cervicalgia: Secondary | ICD-10-CM

## 2024-01-23 DIAGNOSIS — I679 Cerebrovascular disease, unspecified: Secondary | ICD-10-CM

## 2024-01-23 DIAGNOSIS — R202 Paresthesia of skin: Secondary | ICD-10-CM | POA: Diagnosis not present

## 2024-01-23 DIAGNOSIS — G629 Polyneuropathy, unspecified: Secondary | ICD-10-CM | POA: Diagnosis not present

## 2024-01-23 HISTORY — DX: Paresthesia of skin: R20.2

## 2024-01-23 HISTORY — DX: Polyneuropathy, unspecified: G62.9

## 2024-01-23 HISTORY — DX: Cerebrovascular disease, unspecified: I67.9

## 2024-01-23 NOTE — Telephone Encounter (Signed)
 Orders Placed This Encounter  Procedures   Vitamin B12   RPR   ANA w/Reflex if Positive   C-reactive protein   TSH   CK   Sedimentation rate   Multiple Myeloma Panel (SPEP&IFE w/QIG)   Lyme Disease Serology w/Reflex   HIV antibody (with reflex)
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
## 2024-01-23 NOTE — Telephone Encounter (Signed)
 scheduled

## 2024-01-24 ENCOUNTER — Ambulatory Visit (HOSPITAL_COMMUNITY): Attending: Internal Medicine

## 2024-01-24 ENCOUNTER — Other Ambulatory Visit: Payer: Self-pay

## 2024-01-24 DIAGNOSIS — R5381 Other malaise: Secondary | ICD-10-CM | POA: Diagnosis not present

## 2024-01-24 DIAGNOSIS — Z8673 Personal history of transient ischemic attack (TIA), and cerebral infarction without residual deficits: Secondary | ICD-10-CM | POA: Diagnosis not present

## 2024-01-24 DIAGNOSIS — M6281 Muscle weakness (generalized): Secondary | ICD-10-CM | POA: Diagnosis not present

## 2024-01-24 DIAGNOSIS — R2689 Other abnormalities of gait and mobility: Secondary | ICD-10-CM | POA: Insufficient documentation

## 2024-01-24 DIAGNOSIS — Z7409 Other reduced mobility: Secondary | ICD-10-CM | POA: Insufficient documentation

## 2024-01-24 NOTE — Procedures (Signed)
 Full Name: Karen [name] Gender: Female MRN #: [id number] Date of Birth: 1970-10-10    Visit Date: 01/23/2024 14:41 Age: 53 Years Examining Physician: Onita Duos Referring Physician: Onita Duos Height: 5 feet 5 inch History: 53 year old female with slow onset bilateral feet paresthesia, intermittent fingertips paresthesia  Summary of the test: Nerve conduction study: Bilateral sural, superficial peroneal sensory responses were absent. Bilateral peroneal to EDB tibial motor responses were absent. Bilateral ulnar sensory and motor responses were within normal limit. Bilateral median sensory and motor responses were also within normal limit. Left median, ulnar transcarpal tunnel response showed no significant difference  Electromyography: Selected needle examination of bilateral lower extremity muscles, lumbosacral paraspinal, right abductor hallucis longus, right upper extremity muscle, cervical paraspinal muscle performed.  There was increased insertional activity, 2+ spontaneous activity, very small complex motor unit potential noted at the right abductor hallucis longus muscle.  Conclusion: This is an abnormal study.  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  There is no evidence of upper extremity focal neuropathy    ------------------------------- Duos Onita. M.D. Ph.D.   [facility] [address] [place], [place] [address] Tel: [phone] Fax: [phone]  Verbal informed consent was obtained from the patient, patient was informed of potential risk of procedure, including bruising, bleeding, hematoma formation, infection, muscle weakness, muscle pain, numbness, among others.        MNC    Nerve / Sites Muscle Latency Ref. Amplitude Ref. Rel Amp Segments Distance Velocity Ref. Area    ms ms mV mV %  cm m/s m/s mVms  R Median - APB     Wrist APB 3.1 <=4.4 6.1 >=4.0 100 Wrist - APB 7    14.1     Upper arm APB 6.6  5.9  95.3 Upper arm - Wrist 21 60 >=49 13.9  L Median - APB     Wrist APB 3.0 <=4.4 6.5 >=4.0 100 Wrist - APB 7   21.8     Upper arm APB 6.5  6.1  94 Upper arm - Wrist 24 67 >=49 22.5  R Ulnar - ADM     Wrist ADM 2.4 <=3.3 9.1 >=6.0 100 Wrist - ADM 7   28.1     B.Elbow ADM 4.4  8.7  95.5 B.Elbow - Wrist 12 59 >=49 26.6     A.Elbow ADM 6.9  8.7  100 A.Elbow - B.Elbow 13 52 >=49 26.9  L Ulnar - ADM     Wrist ADM 2.7 <=3.3 7.8 >=6.0 100 Wrist - ADM 7   24.9     B.Elbow ADM 4.5  8.0  103 B.Elbow - Wrist 12 65 >=49 24.5     A.Elbow ADM 6.9  8.3  103 A.Elbow - B.Elbow 13 54 >=49 26.6  R Peroneal - EDB     Ankle EDB NR <=6.5 NR >=2.0 NR Ankle - EDB 9   NR     Fib head EDB      Fib head - Ankle   >=44          Pop fossa - Ankle      L Peroneal - EDB     Ankle EDB NR <=6.5 NR >=2.0 NR Ankle - EDB 9   NR         Pop fossa - Ankle      R Tibial - AH     Ankle AH NR <=5.8 NR >=4.0 NR Ankle - AH  9   NR     Pop fossa AH      Pop fossa - Ankle   >=41   L Tibial - AH     Ankle AH NR <=5.8 NR >=4.0 NR Ankle - AH 9   NR                     SNC    Nerve / Sites Rec. Site Peak Lat Ref.  Amp Ref. Segments Distance Peak Diff Ref.    ms ms V V  cm ms ms  R Sural - Ankle (Calf)     Calf Ankle NR <=4.4 NR >=6 Calf - Ankle 14    L Sural - Ankle (Calf)     Calf Ankle NR <=4.4 NR >=6 Calf - Ankle 14    L Superficial peroneal - Ankle     Lat leg Ankle NR <=4.4 NR >=6 Lat leg - Ankle 14    R Superficial peroneal - Ankle     Lat leg Ankle NR <=4.4 NR >=6 Lat leg - Ankle 14    L Median, Ulnar - Transcarpal comparison     Median Palm Wrist 2.0 <=2.2 37 >=35 Median Palm - Wrist 8       Ulnar Palm Wrist 1.8 <=2.2 9 >=12 Ulnar Palm - Wrist 8          Median Palm - Ulnar Palm  0.2 <=0.4  R Median - Orthodromic (Dig II, Mid palm)     Dig II Wrist 2.9 <=3.4 12 >=10 Dig II - Wrist 13    L Median - Orthodromic (Dig II, Mid palm)     Dig II Wrist 3.0 <=3.4 10 >=10 Dig II - Wrist 13     R Ulnar - Orthodromic, (Dig V, Mid palm)     Dig V Wrist 2.8 <=3.1 5 >=5 Dig V - Wrist 11    L Ulnar - Orthodromic, (Dig V, Mid palm)     Dig V Wrist 2.7 <=3.1 6 >=5 Dig V - Wrist 57                         F  Wave    Nerve F Lat Ref.   ms ms  R Ulnar - ADM 25.8 <=32.0  L Ulnar - ADM 25.5 <=32.0         EMG Summary Table    Spontaneous MUAP Recruitment  Muscle IA Fib PSW Fasc Other Amp Dur. Poly Pattern  R. Tibialis anterior Normal None None None _______ Normal Normal Normal Normal  R. Tibialis posterior Normal None None None _______ Normal Normal Normal Normal  R. Peroneus longus Normal None None None _______ Normal Normal Normal Normal  R. Gastrocnemius (Medial head) Normal None None None _______ Normal Normal Normal Normal  R. Vastus lateralis Normal None None None _______ Normal Normal Normal Normal  L. Tibialis anterior Normal None None None _______ Normal Normal Normal Normal  L. Tibialis posterior Normal None None None _______ Normal Normal Normal Normal  L. Peroneus longus Normal None None None _______ Normal Normal Normal Normal  L. Gastrocnemius (Medial head) Normal None None None _______ Normal Normal Normal Normal  L. Vastus lateralis Normal None None None _______ Normal Normal Normal Normal  R. Lumbar paraspinals (low) Normal None None None _______ Normal Normal Normal Normal  R. Lumbar paraspinals (mid) Normal None None None _______ Normal Normal Normal Normal  L. Lumbar paraspinals (low)  Normal None None None _______ Normal Normal Normal Normal  L. Lumbar paraspinals (mid) Normal None None None _______ Normal Normal Normal Normal  R. First dorsal interosseous Normal None None None _______ Normal Normal Normal Normal  R. Pronator teres Normal None None None _______ Normal Normal Normal Normal  R. Biceps brachii Normal None None None _______ Normal Normal Normal Normal  R. Deltoid Normal None None None _______ Normal Normal Normal Normal  R. Triceps brachii Normal None  None None _______ Normal Normal Normal Normal  R. Cervical paraspinals Normal None None None _______ Normal Normal Normal Normal  R. Abductor hallucis Increased 2+ 2+ None _______ Normal Normal Normal Reduced

## 2024-01-30 ENCOUNTER — Ambulatory Visit: Admitting: Internal Medicine

## 2024-02-07 ENCOUNTER — Ambulatory Visit (HOSPITAL_COMMUNITY): Attending: Internal Medicine

## 2024-02-07 DIAGNOSIS — M6281 Muscle weakness (generalized): Secondary | ICD-10-CM | POA: Diagnosis present

## 2024-02-07 DIAGNOSIS — R2689 Other abnormalities of gait and mobility: Secondary | ICD-10-CM | POA: Diagnosis present

## 2024-02-07 DIAGNOSIS — Z7409 Other reduced mobility: Secondary | ICD-10-CM | POA: Insufficient documentation
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
## 2024-02-12 ENCOUNTER — Ambulatory Visit: Admitting: Internal Medicine

## 2024-02-12 ENCOUNTER — Encounter: Payer: Self-pay | Admitting: Internal Medicine

## 2024-02-12 VITALS — BP 132/74 | HR 81 | Ht 64.0 in

## 2024-02-12 DIAGNOSIS — I10 Essential (primary) hypertension: Secondary | ICD-10-CM | POA: Diagnosis not present

## 2024-02-12 DIAGNOSIS — F5101 Primary insomnia: Secondary | ICD-10-CM

## 2024-02-12 DIAGNOSIS — Z23 Encounter for immunization: Secondary | ICD-10-CM

## 2024-02-12 DIAGNOSIS — E782 Mixed hyperlipidemia: Secondary | ICD-10-CM

## 2024-02-12 DIAGNOSIS — E039 Hypothyroidism, unspecified: Secondary | ICD-10-CM | POA: Diagnosis not present

## 2024-02-12 DIAGNOSIS — Z8673 Personal history of transient ischemic attack (TIA), and cerebral infarction without residual deficits: Secondary | ICD-10-CM | POA: Diagnosis not present

## 2024-02-12 DIAGNOSIS — R7303 Prediabetes: Secondary | ICD-10-CM

## 2024-02-12 DIAGNOSIS — I272 Pulmonary hypertension, unspecified: Secondary | ICD-10-CM | POA: Diagnosis not present

## 2024-02-12 DIAGNOSIS — E559 Vitamin D deficiency, unspecified: Secondary | ICD-10-CM | POA: Diagnosis not present

## 2024-02-12 NOTE — Assessment & Plan Note (Signed)
 BP Readings from Last 1 Encounters:  02/12/24 132/74   Well-controlled with Zebeta  5 mg once daily, Olmesartan  40 mg QD and spironolactone  25 mg BID at home Noncompliance is a big concern, would avoid changing any medication for now Since she had leg swelling even with half tablet of amlodipine , she stopped taking it Counseled for compliance with the medications Advised DASH diet and moderate exercise/walking, at least 150 mins/week

## 2024-02-12 NOTE — Progress Notes (Signed)
 Established Patient Office Visit  Subjective:  Patient ID: Karen [name], female    DOB: 08-Jul-1970  Age: 53 y.o. MRN: [id number]  CC:  Chief Complaint  Patient presents with   Follow-up    Follow up on forms, needs clearance to prove she is capable of driving.     HPI Karen [name] is a 53 y.o. female with past medical history of HTN, scleroderma, ILD, pulmonary hypertension, dysphagia, hypothyroidism, chronic constipation and hot flashes who presents for f/u of her chronic medical conditions.  She has history of hypothyroidism, for which she takes NP thyroid . She currently denies any tremors, palpitations, or recent change in weight or appetite.    Essential hypertension and pulmonary hypertension: Her blood pressure is elevated today, but reports that her BP stays around 130s/70s at home. She takes olmesartan  20 mg QD, Zebeta  5 mg QD and spironolactone  25 mg BID. She had to stop amlodipine  in the past due to worsening of leg swelling.  She has history of pulmonary hypertension from ILD, for which she takes spironolactone  and Lasix  currently. She is also on Opsumit . She follows up with CHF clinic. She has chronic, intermittent leg swelling.  She reports chronic cough, but denies any fever, chills, or recent worsening of dyspnea.  She has chronic nasal congestion and reports history of environmental allergies.  She has tried multiple antihistaminics including Zyrtec, Xyzal, etc.  She has history of scleroderma, for which she sees Dr. Ishmael. She is on Cellcept  now.  She sees Dr. 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  She is on Ofev  for it.  She had steroid injection for cervical neck pain.  She has noticed improvement in neck pain and radiating pain in the LUE.  She still has numbness of the left thumb.  She had returned to work from the last week.  Her fatigue and dizziness have improved now.  She states that she is trying to improve her dietary habits and increase her fluid  intake.  She is currently undergoing neuro PT.  She has brought DMV paperwork for clearance to drive again.  Past Medical History:  Diagnosis Date   Acute respiratory failure with hypoxia due to flash pulmonary edema requiring intubation 02/13/2020   Calculus of gallbladder with acute cholecystitis without obstruction    CHF (congestive heart failure) (HCC)    Gastroparesis    GERD (gastroesophageal reflux disease)    HTN (hypertension) 03/23/2017   Hypothyroidism    ILD (interstitial lung disease) (HCC) 02/08/2018   Multinodular thyroid     benign FNA 08/2017.   Perimenopause 02/27/2019   Pulmonary edema    Pulmonary hypertension (HCC) 08/04/2021   Raynaud's disease 11/10/2021   Scleroderma (HCC)    Sjogren's syndrome 08/30/2017   Status post laparoscopic cholecystectomy 02/13/20 02/13/2020   Vitamin D  deficiency disease 02/27/2019    Past Surgical History:  Procedure Laterality Date   ABDOMINAL HERNIA REPAIR     BIOPSY OF SKIN SUBCUTANEOUS TISSUE AND/OR MUCOUS MEMBRANE  08/02/2023   Procedure: BIOPSY, SKIN, SUBCUTANEOUS TISSUE, OR MUCOUS MEMBRANE;  Surgeon: Leigh Elspeth SQUIBB, MD;  Location: [facility];  Service: Gastroenterology;;   ROMAYNE Bilateral 10/2018   CHOLECYSTECTOMY N/A 02/13/2020   Procedure: LAPAROSCOPIC CHOLECYSTECTOMY;  Surgeon: Mavis Anes, MD;  Location: [facility];  Service: General;  Laterality: N/A;   COLONOSCOPY WITH PROPOFOL  N/A 01/02/2019   Procedure: COLONOSCOPY WITH PROPOFOL ;  Surgeon: Shaaron Lamar HERO, MD;  Location: [facility];  Service: Endoscopy;  Laterality: N/A;  12:30pm  ESOPHAGOGASTRODUODENOSCOPY (EGD) WITH PROPOFOL  N/A 01/28/2018   erosive reflux esophagitis, patulous EG Junction, no dilation, incomplete EGD due to retained food in stomach. GES thereafter with delayed gastric emptying.    ESOPHAGOGASTRODUODENOSCOPY (EGD) WITH PROPOFOL  N/A 08/02/2023   Procedure: ESOPHAGOGASTRODUODENOSCOPY (EGD) WITH PROPOFOL ;  Surgeon: Leigh Elspeth SQUIBB,  MD;  Location: [facility];  Service: Gastroenterology;  Laterality: N/A;   RIGHT HEART CATH N/A 09/27/2017   Procedure: RIGHT HEART CATH;  Surgeon: Cherrie Toribio SAUNDERS, MD;  Location: [facility];  Service: Cardiovascular;  Laterality: N/A;   RIGHT HEART CATH N/A 09/05/2019   Procedure: RIGHT HEART CATH;  Surgeon: Cherrie Toribio SAUNDERS, MD;  Location: [facility];  Service: Cardiovascular;  Laterality: N/A;   RIGHT/LEFT HEART CATH AND CORONARY ANGIOGRAPHY N/A 08/18/2021   Procedure: RIGHT/LEFT HEART CATH AND CORONARY ANGIOGRAPHY;  Surgeon: Cherrie Toribio SAUNDERS, MD;  Location: [facility];  Service: Cardiovascular;  Laterality: N/A;   UTERINE FIBROID SURGERY      Family History  Problem Relation Age of Onset   Hypertension Father    Hypertension Sister    Hypertension Brother    Diabetes Maternal Grandfather    Diabetes Maternal Uncle    Diabetes Mother    Colon cancer Neg Hx     Social History   Socioeconomic History   Marital status: Single    Spouse name: Not on file   Number of children: 0   Years of education: Not on file   Highest education level: Associate degree: occupational, Scientist, product/process development, or vocational program  Occupational History   Occupation: CMA    Employer: [employer]   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 She lives alone in one level home, a few steps to enter.CMA [employer].   Social Drivers of Corporate investment banker Strain: Low Risk  (12/25/2023)   Received from [facility]   Overall Financial Resource Strain (CARDIA)    How hard is it for you to pay for the very basics like food, housing, medical care, and heating?: Not very hard  Food Insecurity:  No Food Insecurity (12/25/2023)   Received from [facility]   Hunger Vital Sign    Within the past 12 months, you worried that your food would run out before you got the money to buy more.: Never true    Within the past 12 months, the food you bought just didn't last and you didn't have money to get more.: Never true  Transportation Needs: No Transportation Needs (12/25/2023)   Received from [facility]   PRAPARE - Transportation    Lack of Transportation (Medical): No    Lack of Transportation (Non-Medical): No  Physical Activity: Inactive (12/25/2023)   Received from [facility]   Exercise Vital Sign    On average, how many days per week do you engage in moderate to strenuous exercise (like a brisk walk)?: 0 days    On average, how many minutes do you engage in exercise at this level?: 0 min  Stress: No Stress Concern Present (12/25/2023)   Received from [facility] - Occupational  Stress Questionnaire    Do you feel stress - tense, restless, nervous, or anxious, or unable to sleep at night because your mind is troubled all the time - these days?: Only a little  Social Connections: Socially Integrated (12/25/2023)   Received from [facility]   Social Connection and Isolation Panel    In a typical week, how many times do you talk on the phone with family, friends, or neighbors?: More than three times a week    How often do you get together with friends or relatives?: More than three times a week    How often do you attend church or religious services?: More than 4 times per year    Do you belong to any clubs or organizations such as church groups, unions, fraternal or athletic groups, or school groups?: Yes    How often do you attend meetings of the clubs or organizations you belong to?: 1 to 4 times per year    Are you married, widowed, divorced, separated, never married, or living with a partner?: Living with partner  Intimate  Partner Violence: Not At Risk (02/04/2023)   Humiliation, Afraid, Rape, and Kick questionnaire    Fear of Current or Ex-Partner: No    Emotionally Abused: No    Physically Abused: No    Sexually Abused: No    Outpatient Medications Prior to Visit  Medication Sig Dispense Refill   acetaminophen  (TYLENOL ) 325 MG tablet Take 2 tablets (650 mg total) by mouth every 6 (six) hours as needed for mild pain, fever or headache (or Fever >/= 101). (Patient taking differently: Take 325-650 mg by mouth as needed for mild pain (pain score 1-3), fever or headache (or Fever >/= 101).) 12 tablet 0   aspirin  81 MG chewable tablet Chew 1 tablet (81 mg total) by mouth daily. 30 tablet 0   bisoprolol  (ZEBETA ) 5 MG tablet Take 1 tablet (5 mg total) by mouth daily. 90 tablet 3   furosemide  (LASIX ) 40 MG tablet Take 1 tablet (40 mg total) by mouth 3 (three) times a week. Mon, Wed, Fri (Patient taking differently: Take 40-80 mg by mouth daily as needed for fluid.) 30 tablet 6   macitentan  (OPSUMIT ) 10 MG tablet Take 1 tablet (10 mg total) by mouth daily. 30 tablet 11   mycophenolate  (CELLCEPT ) 200 MG/ML suspension Take 7.5 mLs (1,500 mg total) by mouth 2 (two) times daily. 480 mL 5   Nintedanib  (OFEV ) 150 MG CAPS Take 1 capsule (150 mg total) by mouth 2 (two) times daily. 60 capsule 5   NP THYROID  120 MG tablet Take 1 tablet (120 mg total) by mouth daily before breakfast. 90 tablet 0   olmesartan  (BENICAR ) 40 MG tablet Take 1 tablet (40 mg total) by mouth daily. 90 tablet 1   potassium chloride  20 MEQ/15ML (10%) SOLN Take 15 mLs (20 mEq total) by mouth daily with furosemide  450 mL 3   Prucalopride Succinate  (MOTEGRITY ) 2 MG TABS Take 1 tablet (2 mg total) by mouth daily. 90 tablet 1   spironolactone  (ALDACTONE ) 25 MG tablet Take 1 tablet (25 mg total) by mouth 2 (two) times daily. 180 tablet 1   sulfamethoxazole -trimethoprim  (BACTRIM  DS) 800-160 MG tablet Take 1 tablet by mouth 3 (three) times a week. 36 tablet 3    traZODone  (DESYREL ) 50 MG tablet Take 1/2-1 tablets (25-50 mg total) by mouth at bedtime as needed for sleep. 30 tablet 3   Vonoprazan Fumarate  (VOQUEZNA ) 10 MG TABS Take 1 tablet  by mouth daily. 30 tablet 6   VOQUEZNA  20 MG TABS Take 20 mg by mouth daily. Lot: [id number], exp: 12-2023 10 tablet 0   No facility-administered medications prior to visit.    Allergies  Allergen Reactions   Lisinopril Hives, Swelling and Other (See Comments)   Tocilizumab  Other (See Comments)    Swelling and shortness of breath sharpe pains in back and chest tightness.    ROS Review of Systems  Constitutional:  Positive for fatigue and unexpected weight change. Negative for chills and fever.  HENT:  Negative for congestion, sinus pressure, sinus pain and sore throat.   Eyes:  Negative for pain and discharge.  Respiratory:  Positive for cough and shortness of breath (intermittent).   Cardiovascular:  Negative for chest pain and palpitations.  Gastrointestinal:  Positive for constipation. Negative for abdominal pain, nausea and vomiting.  Endocrine: Negative for polydipsia and polyuria.  Genitourinary:  Negative for dysuria and hematuria.  Musculoskeletal:  Positive for arthralgias and neck pain. Negative for neck stiffness.  Skin:  Negative for rash.  Allergic/Immunologic: Positive for environmental allergies.  Neurological:  Positive for numbness (LUE). Negative for dizziness and weakness.  Psychiatric/Behavioral:  Negative for agitation and behavioral problems.       Objective:    Physical Exam Vitals reviewed.  Constitutional:      General: She is not in acute distress.    Appearance: She is not diaphoretic.  HENT:     Head: Normocephalic and atraumatic.     Nose: No congestion.     Mouth/Throat:     Mouth: Mucous membranes are moist.  Eyes:     General: No scleral icterus.    Extraocular Movements: Extraocular movements intact.  Cardiovascular:     Rate and Rhythm: Normal rate and regular  rhythm.     Heart sounds: Normal heart sounds. No murmur heard. Pulmonary:     Breath sounds: Normal breath sounds. No wheezing or rales.  Musculoskeletal:     Cervical back: Neck supple. Tenderness present.     Right lower leg: Edema (Mild) present.     Left lower leg: Edema (Mild) present.  Skin:    General: Skin is warm.     Findings: No rash.  Neurological:     General: No focal deficit present.     Mental Status: She is alert and oriented to person, place, and time.     Sensory: No sensory deficit.     Motor: No weakness.  Psychiatric:        Mood and Affect: Mood normal.        Behavior: Behavior normal.     BP 132/74 (BP Location: Left Arm) Comment: At home  Pulse 81   Ht 5' 4 (1.626 m)   BMI 21.97 kg/m  Wt Readings from Last 3 Encounters:  01/23/24 128 lb (58.1 kg)  12/28/23 123 lb 3.2 oz (55.9 kg)  09/13/23 129 lb 6.4 oz (58.7 kg)    Lab Results  Component Value Date   TSH 1.700 09/13/2023   Lab Results  Component Value Date   WBC 9.7 07/25/2023   HGB 11.8 (L) 07/25/2023   HCT 37.2 07/25/2023   MCV 85.9 07/25/2023   PLT 192 07/25/2023   Lab Results  Component Value Date   NA 141 09/13/2023   K 4.3 09/13/2023   CO2 21 09/13/2023   GLUCOSE 86 09/13/2023   BUN 19 09/13/2023   CREATININE 0.84 09/13/2023   BILITOT 0.4 09/13/2023   ALKPHOS  115 09/13/2023   AST 20 09/13/2023   ALT 11 09/13/2023   PROT 8.0 09/13/2023   ALBUMIN 4.7 09/13/2023   CALCIUM 10.9 (H) 09/13/2023   ANIONGAP 10 07/25/2023   EGFR 82 09/13/2023   GFR 87.51 11/20/2019   Lab Results  Component Value Date   CHOL 196 04/13/2022   Lab Results  Component Value Date   HDL 54 04/13/2022   Lab Results  Component Value Date   LDLCALC 125 (H) 04/13/2022   Lab Results  Component Value Date   TRIG 93 04/13/2022   Lab Results  Component Value Date   CHOLHDL 3.6 04/13/2022   Lab Results  Component Value Date   HGBA1C 5.9 (H) 12/28/2022      Assessment & Plan:    Problem List Items Addressed This Visit       Cardiovascular and Mediastinum   HTN (hypertension) - Primary   BP Readings from Last 1 Encounters:  02/12/24 132/74   Well-controlled with Zebeta  5 mg once daily, Olmesartan  40 mg QD and spironolactone  25 mg BID at home Noncompliance is a big concern, would avoid changing any medication for now Since she had leg swelling even with half tablet of amlodipine , she stopped taking it Counseled for compliance with the medications Advised DASH diet and moderate exercise/walking, at least 150 mins/week      Relevant Orders   CMP14+EGFR   CBC with Differential/Platelet   Pulmonary hypertension (HCC)   Likely from ILD due to scleroderma Followed by CHF clinic Appears euvolemic currently, on spironolactone  and as needed Lasix  currently On Opsumit         Endocrine   Hypothyroidism, adult   Lab Results  Component Value Date   TSH 1.700 09/13/2023   On NP thyroid  120 mg QD -needs to take it in a.m. on empty stomach Check TSH and free T4 as she has chronic fatigue      Relevant Orders   TSH + free T4     Other   History of TIA (transient ischemic attack)   Stroke protocol was initiated at [facility] due to reports of facial drooping and drowsiness, but her symptoms are more suggestive of syncope CTA head was negative for any acute blockage or stenosis MRI brain was negative for acute cerebral infarction Advised to discuss with neurologist Continue aspirin  81 mg QD for now  DMV paperwork filled, will need to submit to Neurology and Cardiology office as well      Primary insomnia   Has tried mirtazapine , but did not tolerate it Better with trazodone  25 mg as needed for insomnia      Other Visit Diagnoses       Prediabetes       Relevant Orders   Hemoglobin A1c     Mixed hyperlipidemia       Relevant Orders   Lipid panel     Vitamin D  deficiency       Relevant Orders   VITAMIN D  25 Hydroxy (Vit-D Deficiency,  Fractures)     Encounter for immunization       Relevant Orders   Flu vaccine trivalent PF, 6mos and older(Flulaval,Afluria,Fluarix,Fluzone) (Completed)         No orders of the defined types were placed in this encounter.   Follow-up: Return in about 4 months (around 06/14/2024) for HTN and hypothyroidism.    Suzzane MARLA Blanch, MD

## 2024-02-12 NOTE — Patient Instructions (Signed)
 Please continue to take medications as prescribed.  Please continue to follow low salt diet and perform moderate exercise/walking as tolerated.

## 2024-02-12 NOTE — Assessment & Plan Note (Addendum)
 Lab Results  Component Value Date   TSH 1.700 09/13/2023   On NP thyroid  120 mg QD -needs to take it in a.m. on empty stomach Check TSH and free T4 as she has chronic fatigue

## 2024-02-12 NOTE — Assessment & Plan Note (Signed)
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

## 2024-02-12 NOTE — Assessment & Plan Note (Signed)
 Has tried mirtazapine , but did not tolerate it Better with trazodone  25 mg as needed for insomnia

## 2024-02-12 NOTE — Assessment & Plan Note (Signed)
 Stroke protocol was initiated at [facility] due to reports of facial drooping and drowsiness, but her symptoms are more suggestive of syncope CTA head was negative for any acute blockage or stenosis MRI brain was negative for acute cerebral infarction Advised to discuss with neurologist Continue aspirin  81 mg QD for now  DMV paperwork filled, will need to submit to Neurology and Cardiology office as well

## 2024-02-13 ENCOUNTER — Telehealth: Payer: Self-pay

## 2024-02-13 ENCOUNTER — Ambulatory Visit: Payer: Self-pay | Admitting: Internal Medicine

## 2024-02-13 LAB — CBC WITH DIFFERENTIAL/PLATELET
Basophils Absolute: 0 x10E3/uL (ref 0.0–0.2)
Basos: 1 %
EOS (ABSOLUTE): 0.1 x10E3/uL (ref 0.0–0.4)
Eos: 2 %
Hematocrit: 39.2 % (ref 34.0–46.6)
Hemoglobin: 12.1 g/dL (ref 11.1–15.9)
Immature Grans (Abs): 0 x10E3/uL (ref 0.0–0.1)
Immature Granulocytes: 0 %
Lymphocytes Absolute: 1.9 x10E3/uL (ref 0.7–3.1)
Lymphs: 29 %
MCH: 27.4 pg (ref 26.6–33.0)
MCHC: 30.9 g/dL — ABNORMAL LOW (ref 31.5–35.7)
MCV: 89 fL (ref 79–97)
Monocytes Absolute: 0.5 x10E3/uL (ref 0.1–0.9)
Monocytes: 8 %
Neutrophils Absolute: 4 x10E3/uL (ref 1.4–7.0)
Neutrophils: 60 %
Platelets: 173 x10E3/uL (ref 150–450)
RBC: 4.42 x10E6/uL (ref 3.77–5.28)
RDW: 13.2 % (ref 11.7–15.4)
WBC: 6.6 x10E3/uL (ref 3.4–10.8)

## 2024-02-13 LAB — LIPID PANEL
Chol/HDL Ratio: 4 ratio (ref 0.0–4.4)
Cholesterol, Total: 194 mg/dL (ref 100–199)
HDL: 48 mg/dL (ref >39)
LDL Chol Calc (NIH): 128 mg/dL — ABNORMAL HIGH (ref 0–99)
Triglycerides: 100 mg/dL (ref 0–149)
VLDL Cholesterol Cal: 18 mg/dL (ref 5–40)

## 2024-02-13 LAB — CMP14+EGFR
ALT: 11 IU/L (ref 0–32)
AST: 18 IU/L (ref 0–40)
Albumin: 4.3 g/dL (ref 3.8–4.9)
Alkaline Phosphatase: 95 IU/L (ref 49–135)
BUN/Creatinine Ratio: 19 (ref 9–23)
BUN: 14 mg/dL (ref 6–24)
Bilirubin Total: 0.6 mg/dL (ref 0.0–1.2)
CO2: 23 mmol/L (ref 20–29)
Calcium: 10.5 mg/dL — ABNORMAL HIGH (ref 8.7–10.2)
Chloride: 105 mmol/L (ref 96–106)
Creatinine, Ser: 0.75 mg/dL (ref 0.57–1.00)
Globulin, Total: 2.8 g/dL (ref 1.5–4.5)
Glucose: 83 mg/dL (ref 70–99)
Potassium: 4.1 mmol/L (ref 3.5–5.2)
Sodium: 143 mmol/L (ref 134–144)
Total Protein: 7.1 g/dL (ref 6.0–8.5)
eGFR: 94 mL/min/1.73 (ref >59)

## 2024-02-13 LAB — TSH+FREE T4
Free T4: 1.33 ng/dL (ref 0.82–1.77)
TSH: 2.34 u[IU]/mL (ref 0.450–4.500)

## 2024-02-13 LAB — VITAMIN D 25 HYDROXY (VIT D DEFICIENCY, FRACTURES): Vit D, 25-Hydroxy: 45.9 ng/mL (ref 30.0–100.0)

## 2024-02-13 LAB — HEMOGLOBIN A1C
Est. average glucose Bld gHb Est-mCnc: 117 mg/dL
Hgb A1c MFr Bld: 5.7 % — ABNORMAL HIGH (ref 4.8–5.6)

## 2024-02-13 NOTE — Telephone Encounter (Signed)
Forms at the front desk ready for pick up

## 2024-02-14 ENCOUNTER — Ambulatory Visit (HOSPITAL_COMMUNITY)

## 2024-02-19 ENCOUNTER — Encounter (HOSPITAL_COMMUNITY): Payer: Self-pay

## 2024-02-19 ENCOUNTER — Other Ambulatory Visit: Payer: Self-pay

## 2024-02-19 ENCOUNTER — Inpatient Hospital Stay (HOSPITAL_COMMUNITY)
Admission: EM | Admit: 2024-02-19 | Discharge: 2024-02-21 | DRG: 304 | Disposition: A | Discharge status: Home / Self Care | Attending: Internal Medicine | Admitting: Internal Medicine

## 2024-02-19 ENCOUNTER — Emergency Department (HOSPITAL_COMMUNITY)

## 2024-02-19 DIAGNOSIS — Z7982 Long term (current) use of aspirin: Secondary | ICD-10-CM

## 2024-02-19 DIAGNOSIS — G9340 Encephalopathy, unspecified: Secondary | ICD-10-CM | POA: Diagnosis not present

## 2024-02-19 DIAGNOSIS — K219 Gastro-esophageal reflux disease without esophagitis: Secondary | ICD-10-CM | POA: Insufficient documentation

## 2024-02-19 DIAGNOSIS — G43001 Migraine without aura, not intractable, with status migrainosus: Secondary | ICD-10-CM

## 2024-02-19 DIAGNOSIS — M79609 Pain in unspecified limb: Secondary | ICD-10-CM | POA: Insufficient documentation

## 2024-02-19 DIAGNOSIS — G629 Polyneuropathy, unspecified: Secondary | ICD-10-CM | POA: Diagnosis present

## 2024-02-19 DIAGNOSIS — I272 Pulmonary hypertension, unspecified: Secondary | ICD-10-CM | POA: Diagnosis not present

## 2024-02-19 DIAGNOSIS — G43009 Migraine without aura, not intractable, without status migrainosus: Secondary | ICD-10-CM | POA: Diagnosis present

## 2024-02-19 DIAGNOSIS — I161 Hypertensive emergency: Secondary | ICD-10-CM | POA: Diagnosis not present

## 2024-02-19 DIAGNOSIS — E039 Hypothyroidism, unspecified: Secondary | ICD-10-CM | POA: Diagnosis present

## 2024-02-19 DIAGNOSIS — I110 Hypertensive heart disease with heart failure: Secondary | ICD-10-CM | POA: Diagnosis present

## 2024-02-19 DIAGNOSIS — M3500 Sicca syndrome, unspecified: Secondary | ICD-10-CM | POA: Diagnosis present

## 2024-02-19 DIAGNOSIS — G9341 Metabolic encephalopathy: Secondary | ICD-10-CM | POA: Diagnosis present

## 2024-02-19 DIAGNOSIS — R410 Disorientation, unspecified: Principal | ICD-10-CM

## 2024-02-19 DIAGNOSIS — R202 Paresthesia of skin: Secondary | ICD-10-CM | POA: Insufficient documentation

## 2024-02-19 DIAGNOSIS — J849 Interstitial pulmonary disease, unspecified: Secondary | ICD-10-CM | POA: Diagnosis not present

## 2024-02-19 DIAGNOSIS — I6782 Cerebral ischemia: Secondary | ICD-10-CM | POA: Diagnosis not present

## 2024-02-19 DIAGNOSIS — I5032 Chronic diastolic (congestive) heart failure: Secondary | ICD-10-CM | POA: Diagnosis not present

## 2024-02-19 DIAGNOSIS — Z8673 Personal history of transient ischemic attack (TIA), and cerebral infarction without residual deficits: Secondary | ICD-10-CM

## 2024-02-19 DIAGNOSIS — Z9049 Acquired absence of other specified parts of digestive tract: Secondary | ICD-10-CM

## 2024-02-19 DIAGNOSIS — Z833 Family history of diabetes mellitus: Secondary | ICD-10-CM

## 2024-02-19 DIAGNOSIS — Z888 Allergy status to other drugs, medicaments and biological substances status: Secondary | ICD-10-CM

## 2024-02-19 DIAGNOSIS — R29818 Other symptoms and signs involving the nervous system: Secondary | ICD-10-CM | POA: Diagnosis not present

## 2024-02-19 DIAGNOSIS — Z8249 Family history of ischemic heart disease and other diseases of the circulatory system: Secondary | ICD-10-CM

## 2024-02-19 DIAGNOSIS — M349 Systemic sclerosis, unspecified: Secondary | ICD-10-CM | POA: Diagnosis not present

## 2024-02-19 DIAGNOSIS — I160 Hypertensive urgency: Secondary | ICD-10-CM | POA: Diagnosis present

## 2024-02-19 DIAGNOSIS — Z79899 Other long term (current) drug therapy: Secondary | ICD-10-CM

## 2024-02-19 DIAGNOSIS — I7300 Raynaud's syndrome without gangrene: Secondary | ICD-10-CM | POA: Diagnosis present

## 2024-02-19 DIAGNOSIS — I2721 Secondary pulmonary arterial hypertension: Secondary | ICD-10-CM | POA: Diagnosis present

## 2024-02-19 DIAGNOSIS — Z79890 Hormone replacement therapy (postmenopausal): Secondary | ICD-10-CM

## 2024-02-19 LAB — CBC
HCT: 42.7 % (ref 36.0–46.0)
Hemoglobin: 13.3 g/dL (ref 12.0–15.0)
MCH: 27.5 pg (ref 26.0–34.0)
MCHC: 31.1 g/dL (ref 30.0–36.0)
MCV: 88.2 fL (ref 80.0–100.0)
Platelets: 211 K/uL (ref 150–400)
RBC: 4.84 MIL/uL (ref 3.87–5.11)
RDW: 14.2 % (ref 11.5–15.5)
WBC: 7.5 K/uL (ref 4.0–10.5)
nRBC: 0 % (ref 0.0–0.2)

## 2024-02-19 LAB — DIFFERENTIAL
Abs Immature Granulocytes: 0.02 K/uL (ref 0.00–0.07)
Basophils Absolute: 0 K/uL (ref 0.0–0.1)
Basophils Relative: 0 %
Eosinophils Absolute: 0.2 K/uL (ref 0.0–0.5)
Eosinophils Relative: 2 %
Immature Granulocytes: 0 %
Lymphocytes Relative: 38 %
Lymphs Abs: 2.8 K/uL (ref 0.7–4.0)
Monocytes Absolute: 0.6 K/uL (ref 0.1–1.0)
Monocytes Relative: 9 %
Neutro Abs: 3.8 K/uL (ref 1.7–7.7)
Neutrophils Relative %: 51 %

## 2024-02-19 LAB — PROTIME-INR
INR: 1.1 (ref 0.8–1.2)
Prothrombin Time: 14.8 s (ref 11.4–15.2)

## 2024-02-19 LAB — COMPREHENSIVE METABOLIC PANEL WITH GFR
ALT: 12 U/L (ref 0–44)
AST: 22 U/L (ref 15–41)
Albumin: 4.5 g/dL (ref 3.5–5.0)
Alkaline Phosphatase: 90 U/L (ref 38–126)
Anion gap: 14 (ref 5–15)
BUN: 19 mg/dL (ref 6–20)
CO2: 25 mmol/L (ref 22–32)
Calcium: 11 mg/dL — ABNORMAL HIGH (ref 8.9–10.3)
Chloride: 102 mmol/L (ref 98–111)
Creatinine, Ser: 0.75 mg/dL (ref 0.44–1.00)
GFR, Estimated: >60 mL/min (ref >60)
Glucose, Bld: 95 mg/dL (ref 70–99)
Potassium: 3.7 mmol/L (ref 3.5–5.1)
Sodium: 141 mmol/L (ref 135–145)
Total Bilirubin: 0.5 mg/dL (ref 0.0–1.2)
Total Protein: 8.1 g/dL (ref 6.5–8.1)

## 2024-02-19 LAB — ETHANOL: Alcohol, Ethyl (B): <15 mg/dL (ref <15)

## 2024-02-19 LAB — APTT: aPTT: 28 s (ref 24–36)

## 2024-02-19 LAB — CBG MONITORING, ED: Glucose-Capillary: 109 mg/dL — ABNORMAL HIGH (ref 70–99)

## 2024-02-19 MED ORDER — DIPHENHYDRAMINE HCL 50 MG/ML IJ SOLN
25.0000 mg | Freq: Once | INTRAMUSCULAR | Status: AC
  Administered 2024-02-19: 25 mg via INTRAVENOUS
  Filled 2024-02-19: qty 1

## 2024-02-19 MED ORDER — KETOROLAC TROMETHAMINE 30 MG/ML IJ SOLN
30.0000 mg | Freq: Once | INTRAMUSCULAR | Status: AC
  Administered 2024-02-19: 30 mg via INTRAVENOUS
  Filled 2024-02-19: qty 1

## 2024-02-19 MED ORDER — LABETALOL HCL 5 MG/ML IV SOLN
20.0000 mg | Freq: Once | INTRAVENOUS | Status: AC
  Administered 2024-02-19: 20 mg via INTRAVENOUS
  Filled 2024-02-19: qty 4

## 2024-02-19 MED ORDER — METOCLOPRAMIDE HCL 5 MG/ML IJ SOLN
10.0000 mg | Freq: Once | INTRAMUSCULAR | Status: AC
  Administered 2024-02-19: 10 mg via INTRAVENOUS
  Filled 2024-02-19: qty 2

## 2024-02-19 NOTE — ED Triage Notes (Addendum)
 Pt family member states that pt was eating dinner tonight and that her face went blank and pt was altered. States this was around 64. Pt is alert but not answering any questions and can not get any further information about how the pt feels. Pt refusing to allow us  to take blood pressure. Pt saying things such as I'm hot and help me but will not answer any questions.

## 2024-02-19 NOTE — ED Notes (Signed)
 Paged Out code stroke Lab and ct

## 2024-02-19 NOTE — ED Notes (Signed)
Pt sleeping at this time. Spouse at bedside.

## 2024-02-19 NOTE — Consult Note (Addendum)
 [facility] Consult [facility]   Patient Name:   Karen [name], Karen [name] Date of Birth:   [birth year] Identification Number:   MRN - [id number] Date of Service:   02/19/2024 20:10:29  Diagnosis:       R51.9 - Headache, unspecified  Impression:      53 yo F w/ PMHx of recent stroke August 2025, HTN, pulm HTN and ILD, who presents due to 2 days of headache and difficulty speaking and confusion starting today. NIHSS 11. There are no focal unilateral deficits on her exam. Current neurologic exam is more suggestive of complex migraine or functional neurologic disorder, and less suggestive of acute stroke. She is not a candidate for thrombolytic due to history of stroke in the past 3 months.    Recommend a migraine cocktail, and SBP control to goal < 180. If symptoms improve, she would be OK for dispo from a neurologic perspective with outpatient neurology followup. If symptoms do not improve, then would recommend admission for MRI Brain w/o contrast.  Sign Out:       Discussed with Emergency Department Provider    ------------------------------------------------------------------------------  Advanced Imaging: Advanced Imaging Deferred because:  Advanced Imaging not obtained at this time. Reason: LVO not suspected with clinical presentation and exam   Metrics: Last Known Well: 02/19/2024 19:00:00 Dispatch Time: 02/19/2024 20:10:29 Arrival Time: 02/19/2024 19:48:00 Initial Response Time: 02/19/2024 20:18:26 Symptoms: headache, difficulty speaking. Initial patient interaction: 02/19/2024 20:23:30 NIHSS Assessment Completed: 02/19/2024 20:37:10 Patient is not a candidate for Thrombolytic. Thrombolytic Medical Decision: 02/19/2024 20:37:12 Patient was not deemed candidate for Thrombolytic because of following reasons: Significant head trauma or stroke in previous 3 months .  CT Head: I personally reviewed all the CT images that were available to me and it  showed: no acute hemorrhage or acute territorial infarct  Primary Provider Notified of Diagnostic Impression and Management Plan on: 02/19/2024 20:41:26    ------------------------------------------------------------------------------  History of Present Illness: Patient is a 53 year old Female.  Patient was brought by private transportation with symptoms of headache, difficulty speaking. Patient was LKW around 7pm; she was eating dinner and then started not responding, staring off into space. She kept telling her husband my head and help me but was not speaking appropriately. Patient has been having a headache for 2 days as well, her husband said she was not feeling well and had difficulty functioning especially this morning due to the headache.  Her husband reports she has a history of prior TIAs and a recent stroke in August 2025. Patient's husband states during that admission in August she was thought to have a light stroke but states he kept being told it was unconfirmed. At that time, her BP was also elevated and she also had difficulty speaking.     Past Medical History:      Hypertension Other PMH:  ILD and pulmonary hypertension  Medications:  No Anticoagulant use  Antiplatelet use: Yes aspirin  81mg  daily Reviewed EMR for current medications  Allergies:  Reviewed  Social History: Smoking: No  Family History:  There is no family history of premature cerebrovascular disease pertinent to this consultation  ROS : 14 Points Review of Systems was performed and was negative except mentioned in HPI.  Past Surgical History: There Is No Surgical History Contributory To Today's Visit     Examination: BP(209/135), Pulse(94), 1A: Level of Consciousness - Alert; keenly responsive + 0 1B: Ask Month and Age - Could Not Answer Either Question Correctly + 2 1C: Blink Eyes &  Squeeze Hands - Performs 1 Task + 1 2: Test Horizontal Extraocular Movements - Normal + 0 3: Test  Visual Fields - No Visual Loss + 0 4: Test Facial Palsy (Use Grimace if Obtunded) - Normal symmetry + 0 5A: Test Left Arm Motor Drift - Drift, hits bed + 2 5B: Test Right Arm Motor Drift - Drift, hits bed + 2 6A: Test Left Leg Motor Drift - Drift, hits bed + 2 6B: Test Right Leg Motor Drift - Drift, hits bed + 2 7: Test Limb Ataxia (FNF/Heel-Shin) - No Ataxia + 0 8: Test Sensation - Normal; No sensory loss + 0 9: Test Language/Aphasia - Normal; No aphasia + 0 10: Test Dysarthria - Normal + 0 11: Test Extinction/Inattention - No abnormality + 0  NIHSS Score: 11  NIHSS Free Text : patient will intermittently speak clearly, saying I need help without aphasia or dysarthria  moves all extremities spontaneously and antigravity, but does not follow commands to maintain these for the full 10 or 5 seconds. No asymmetry noted.  Pre-Morbid Modified Rankin Scale: 0 Points = No symptoms at all  Spoke with : Dr. Suzette I reviewed the available imaging via [facility] and initiated discussion with the primary provider  This consult was conducted in real time using interactive audio and video technology. Patient was informed of the technology being used for this visit and agreed to proceed. Patient located in hospital and provider located at home/office setting.   Patient is being evaluated for possible acute neurologic impairment and high probability of imminent or life-threatening deterioration. I spent total of 30 minutes providing care to this patient, including time for face to face visit via telemedicine, review of medical records, imaging studies and discussion of findings with providers, the patient and/or family.    Dr Venus Queen   [facility] For Inpatient follow-up with [facility] physician please call [facility] at [phone]. As we are not an outpatient service for any post hospital discharge needs please contact the hospital for assistance. If you have any questions for the  [facility] physicians or need to reconsult for clinical or diagnostic changes please contact us  via [facility] at [phone].   Signature : Venus Queen

## 2024-02-19 NOTE — ED Notes (Signed)
 Pt moving all extremities, flailing arms and legs, pinched and grabbed this nurse on arm. Attempting to calm pt down verbally, EDP made aware.

## 2024-02-19 NOTE — ED Notes (Signed)
 Dr. Antionette Char at bedside

## 2024-02-19 NOTE — ED Notes (Signed)
 Nurse to room to given BP meds, pt repositioned from side to back. Pt now calm alert and oriented x 4. Pt drowsy from meds, but answers questions appropriately, speaks in quiet voice.Able to tell me name and DOB, also names spouse who is at bedside.

## 2024-02-20 ENCOUNTER — Encounter (HOSPITAL_COMMUNITY): Payer: Self-pay | Admitting: Family Medicine

## 2024-02-20 ENCOUNTER — Observation Stay (HOSPITAL_COMMUNITY)

## 2024-02-20 DIAGNOSIS — Z7982 Long term (current) use of aspirin: Secondary | ICD-10-CM | POA: Diagnosis not present

## 2024-02-20 DIAGNOSIS — G43009 Migraine without aura, not intractable, without status migrainosus: Secondary | ICD-10-CM | POA: Diagnosis present

## 2024-02-20 DIAGNOSIS — I7300 Raynaud's syndrome without gangrene: Secondary | ICD-10-CM | POA: Diagnosis present

## 2024-02-20 DIAGNOSIS — Z79890 Hormone replacement therapy (postmenopausal): Secondary | ICD-10-CM | POA: Diagnosis not present

## 2024-02-20 DIAGNOSIS — I5032 Chronic diastolic (congestive) heart failure: Secondary | ICD-10-CM | POA: Diagnosis present

## 2024-02-20 DIAGNOSIS — G9340 Encephalopathy, unspecified: Secondary | ICD-10-CM | POA: Diagnosis not present

## 2024-02-20 DIAGNOSIS — I272 Pulmonary hypertension, unspecified: Secondary | ICD-10-CM | POA: Diagnosis not present

## 2024-02-20 DIAGNOSIS — Z888 Allergy status to other drugs, medicaments and biological substances status: Secondary | ICD-10-CM | POA: Diagnosis not present

## 2024-02-20 DIAGNOSIS — Z8673 Personal history of transient ischemic attack (TIA), and cerebral infarction without residual deficits: Secondary | ICD-10-CM | POA: Diagnosis not present

## 2024-02-20 DIAGNOSIS — M3500 Sicca syndrome, unspecified: Secondary | ICD-10-CM | POA: Diagnosis present

## 2024-02-20 DIAGNOSIS — E039 Hypothyroidism, unspecified: Secondary | ICD-10-CM | POA: Diagnosis present

## 2024-02-20 DIAGNOSIS — I110 Hypertensive heart disease with heart failure: Secondary | ICD-10-CM | POA: Diagnosis present

## 2024-02-20 DIAGNOSIS — Z9049 Acquired absence of other specified parts of digestive tract: Secondary | ICD-10-CM | POA: Diagnosis not present

## 2024-02-20 DIAGNOSIS — M349 Systemic sclerosis, unspecified: Secondary | ICD-10-CM | POA: Diagnosis not present

## 2024-02-20 DIAGNOSIS — I160 Hypertensive urgency: Secondary | ICD-10-CM | POA: Diagnosis present

## 2024-02-20 DIAGNOSIS — R410 Disorientation, unspecified: Secondary | ICD-10-CM | POA: Diagnosis present

## 2024-02-20 DIAGNOSIS — J849 Interstitial pulmonary disease, unspecified: Secondary | ICD-10-CM | POA: Diagnosis not present

## 2024-02-20 DIAGNOSIS — Z833 Family history of diabetes mellitus: Secondary | ICD-10-CM | POA: Diagnosis not present

## 2024-02-20 DIAGNOSIS — I161 Hypertensive emergency: Secondary | ICD-10-CM | POA: Diagnosis present

## 2024-02-20 DIAGNOSIS — Z79899 Other long term (current) drug therapy: Secondary | ICD-10-CM | POA: Diagnosis not present

## 2024-02-20 DIAGNOSIS — N3000 Acute cystitis without hematuria: Secondary | ICD-10-CM | POA: Diagnosis not present

## 2024-02-20 DIAGNOSIS — Z8249 Family history of ischemic heart disease and other diseases of the circulatory system: Secondary | ICD-10-CM | POA: Diagnosis not present

## 2024-02-20 DIAGNOSIS — I2721 Secondary pulmonary arterial hypertension: Secondary | ICD-10-CM | POA: Diagnosis present

## 2024-02-20 DIAGNOSIS — G9341 Metabolic encephalopathy: Secondary | ICD-10-CM | POA: Diagnosis present

## 2024-02-20 DIAGNOSIS — G629 Polyneuropathy, unspecified: Secondary | ICD-10-CM | POA: Diagnosis present

## 2024-02-20 LAB — URINALYSIS, W/ REFLEX TO CULTURE (INFECTION SUSPECTED)
Bacteria, UA: NONE SEEN
Bilirubin Urine: NEGATIVE
Glucose, UA: NEGATIVE mg/dL
Hgb urine dipstick: NEGATIVE
Ketones, ur: NEGATIVE mg/dL
Nitrite: NEGATIVE
Protein, ur: 100 mg/dL — AB
Specific Gravity, Urine: 1.02 (ref 1.005–1.030)
WBC, UA: >50 WBC/hpf (ref 0–5)
pH: 5 (ref 5.0–8.0)

## 2024-02-20 LAB — BASIC METABOLIC PANEL WITH GFR
Anion gap: 14 (ref 5–15)
BUN: 15 mg/dL (ref 6–20)
CO2: 21 mmol/L — ABNORMAL LOW (ref 22–32)
Calcium: 10.6 mg/dL — ABNORMAL HIGH (ref 8.9–10.3)
Chloride: 105 mmol/L (ref 98–111)
Creatinine, Ser: 0.68 mg/dL (ref 0.44–1.00)
GFR, Estimated: >60 mL/min (ref >60)
Glucose, Bld: 102 mg/dL — ABNORMAL HIGH (ref 70–99)
Potassium: 3.3 mmol/L — ABNORMAL LOW (ref 3.5–5.1)
Sodium: 140 mmol/L (ref 135–145)

## 2024-02-20 LAB — URINE DRUG SCREEN
Amphetamines: NEGATIVE
Barbiturates: NEGATIVE
Benzodiazepines: NEGATIVE
Cocaine: NEGATIVE
Fentanyl: NEGATIVE
Methadone Scn, Ur: NEGATIVE
Opiates: NEGATIVE
Tetrahydrocannabinol: NEGATIVE

## 2024-02-20 LAB — CBC
HCT: 40 % (ref 36.0–46.0)
Hemoglobin: 12.8 g/dL (ref 12.0–15.0)
MCH: 27.8 pg (ref 26.0–34.0)
MCHC: 32 g/dL (ref 30.0–36.0)
MCV: 87 fL (ref 80.0–100.0)
Platelets: 231 K/uL (ref 150–400)
RBC: 4.6 MIL/uL (ref 3.87–5.11)
RDW: 14.3 % (ref 11.5–15.5)
WBC: 8.8 K/uL (ref 4.0–10.5)
nRBC: 0 % (ref 0.0–0.2)

## 2024-02-20 LAB — VITAMIN B12: Vitamin B-12: 471 pg/mL (ref 180–914)

## 2024-02-20 LAB — RPR: RPR Ser Ql: NONREACTIVE

## 2024-02-20 LAB — TSH: TSH: 3.55 u[IU]/mL (ref 0.350–4.500)

## 2024-02-20 LAB — AMMONIA: Ammonia: 26 umol/L (ref 9–35)

## 2024-02-20 MED ORDER — HYDRALAZINE HCL 20 MG/ML IJ SOLN
5.0000 mg | INTRAMUSCULAR | Status: DC | PRN
  Administered 2024-02-20: 5 mg via INTRAVENOUS
  Filled 2024-02-20: qty 1

## 2024-02-20 MED ORDER — ASPIRIN 81 MG PO CHEW
81.0000 mg | CHEWABLE_TABLET | Freq: Every day | ORAL | Status: DC
  Administered 2024-02-20 – 2024-02-21 (×2): 81 mg via ORAL
  Filled 2024-02-20 (×2): qty 1

## 2024-02-20 MED ORDER — THYROID 30 MG PO TABS
120.0000 mg | ORAL_TABLET | Freq: Every day | ORAL | Status: DC
  Filled 2024-02-20: qty 1
  Filled 2024-02-20: qty 4
  Filled 2024-02-20: qty 1

## 2024-02-20 MED ORDER — OXYCODONE HCL 5 MG PO TABS
5.0000 mg | ORAL_TABLET | ORAL | Status: DC | PRN
  Administered 2024-02-20: 5 mg via ORAL
  Filled 2024-02-20: qty 1

## 2024-02-20 MED ORDER — ENOXAPARIN SODIUM 40 MG/0.4ML IJ SOSY
40.0000 mg | PREFILLED_SYRINGE | INTRAMUSCULAR | Status: DC
  Administered 2024-02-20 – 2024-02-21 (×2): 40 mg via SUBCUTANEOUS
  Filled 2024-02-20 (×2): qty 0.4

## 2024-02-20 MED ORDER — HYDRALAZINE HCL 25 MG PO TABS
25.0000 mg | ORAL_TABLET | Freq: Three times a day (TID) | ORAL | Status: DC
  Administered 2024-02-20 – 2024-02-21 (×3): 25 mg via ORAL
  Filled 2024-02-20 (×3): qty 1

## 2024-02-20 MED ORDER — CHLORTHALIDONE 25 MG PO TABS
25.0000 mg | ORAL_TABLET | Freq: Every day | ORAL | Status: DC
  Administered 2024-02-20 – 2024-02-21 (×2): 25 mg via ORAL
  Filled 2024-02-20 (×2): qty 1

## 2024-02-20 MED ORDER — AMLODIPINE BESYLATE 5 MG PO TABS
5.0000 mg | ORAL_TABLET | Freq: Every day | ORAL | Status: DC
Start: 2024-02-20 — End: 2024-02-20

## 2024-02-20 MED ORDER — SODIUM CHLORIDE 0.9% FLUSH
3.0000 mL | Freq: Two times a day (BID) | INTRAVENOUS | Status: DC
  Administered 2024-02-20 (×2): 3 mL via INTRAVENOUS

## 2024-02-20 MED ORDER — ACETAMINOPHEN 650 MG RE SUPP: 650.0000 mg | Freq: Four times a day (QID) | RECTAL | Status: DC | PRN

## 2024-02-20 MED ORDER — ONDANSETRON HCL 4 MG/2ML IJ SOLN: 4.0000 mg | Freq: Four times a day (QID) | INTRAMUSCULAR | Status: DC | PRN

## 2024-02-20 MED ORDER — MACITENTAN 10 MG PO TABS
10.0000 mg | ORAL_TABLET | Freq: Every day | ORAL | Status: DC
  Administered 2024-02-20: 10 mg via ORAL
  Filled 2024-02-20 (×4): qty 1

## 2024-02-20 MED ORDER — SODIUM CHLORIDE 0.9 % IV SOLN: INTRAVENOUS | Status: DC

## 2024-02-20 MED ORDER — KETOROLAC TROMETHAMINE 15 MG/ML IJ SOLN: 15.0000 mg | Freq: Four times a day (QID) | INTRAMUSCULAR | Status: DC | PRN

## 2024-02-20 MED ORDER — ONDANSETRON HCL 4 MG PO TABS: 4.0000 mg | ORAL_TABLET | Freq: Four times a day (QID) | ORAL | Status: DC | PRN

## 2024-02-20 MED ORDER — ACETAMINOPHEN 325 MG PO TABS
650.0000 mg | ORAL_TABLET | Freq: Four times a day (QID) | ORAL | Status: DC | PRN
  Administered 2024-02-20: 650 mg via ORAL
  Filled 2024-02-20: qty 2

## 2024-02-20 MED ORDER — SENNOSIDES-DOCUSATE SODIUM 8.6-50 MG PO TABS: 1.0000 | ORAL_TABLET | Freq: Every evening | ORAL | Status: DC | PRN

## 2024-02-20 NOTE — Plan of Care (Signed)

## 2024-02-20 NOTE — H&P (Signed)
 History and Physical    Karen [name] FMW:[id number] DOB: 1971-02-24 DOA: 02/19/2024  PCP: Tobie Suzzane POUR, MD   Patient coming from: Home   Chief Complaint: Confusion, agitation, headache  HPI: Karen [name] is a 53 y.o. female with medical history significant for hypertension, hypothyroidism, Sjogren syndrome, and scleroderma with ILD and pulmonary hypertension who presents with confusion, agitation, and headache.  Patient is companied by her husband to assist with the history.  She has been complaining of a headache for the past 2 days and then at approximately 7 PM this evening was noted to have a blank stare.  She seemed to have difficulty speaking.  Her husband took her blood pressure and noted the diastolic pressure to be elevated to 130.  He then decided to bring her into the ED for further evaluation.  Aside from the headache, she had not been complaining of anything recently.  ED Course: Upon arrival to the ED, patient is found to be afebrile and saturating well on room air with severely elevated blood pressure.  CMP notable for calcium 11.0.  CBC is normal and ethanol undetectable.  There are no acute findings on head CT.  Patient was evaluated by teleneurology in the ED and it was recommended that she be given migraine cocktail and antihypertensives, and that MRI brain be considered if she fails to return to her usual state following the migraine cocktail.  She was treated with Toradol , Benadryl , Reglan , and 2 doses of IV labetalol  in the ED.  She had significant improvement with this but is still far from her baseline per report of family.  Review of Systems:  ROS limited by patient's clinical condition.  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   Cerebral vascular disease 01/23/2024   Cervical radicular pain 08/08/2023   CHF (congestive heart failure) (HCC)    Chronic diastolic CHF (congestive heart failure) (HCC) 10/11/2022   Chronic GERD 12/18/2017   Chronic nonintractable headache 09/08/2021   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   Perimenopause 02/27/2019   Peripheral polyneuropathy 01/23/2024   Primary osteoarthritis 04/07/2021   Pulmonary edema    Pulmonary hypertension (HCC) 08/04/2021   Raynaud's disease 11/10/2021   Scleroderma (HCC)    Sjogren's syndrome 08/30/2017   Status post laparoscopic cholecystectomy 02/13/20 02/13/2020   Thyroid  nodule 08/04/2021   Vitamin D  deficiency disease 02/27/2019    Past Surgical History:  Procedure Laterality Date   ABDOMINAL HERNIA REPAIR     BIOPSY OF SKIN SUBCUTANEOUS TISSUE AND/OR MUCOUS MEMBRANE  08/02/2023   Procedure: BIOPSY, SKIN, SUBCUTANEOUS TISSUE, OR MUCOUS MEMBRANE;  Surgeon: Leigh Elspeth SQUIBB, MD;  Location: [facility];  Service: Gastroenterology;;   ROMAYNE Bilateral 10/2018   CHOLECYSTECTOMY N/A 02/13/2020   Procedure: LAPAROSCOPIC CHOLECYSTECTOMY;  Surgeon: Mavis,  Oneil, MD;  Location: [facility];  Service: General;  Laterality: N/A;   COLONOSCOPY WITH PROPOFOL  N/A 01/02/2019   Procedure: COLONOSCOPY WITH PROPOFOL ;  Surgeon: Shaaron Lamar HERO, MD;  Location: [facility];  Service: Endoscopy;   Laterality: N/A;  12:30pm   ESOPHAGOGASTRODUODENOSCOPY (EGD) WITH PROPOFOL  N/A 01/28/2018   erosive reflux esophagitis, patulous EG Junction, no dilation, incomplete EGD due to retained food in stomach. GES thereafter with delayed gastric emptying.    ESOPHAGOGASTRODUODENOSCOPY (EGD) WITH PROPOFOL  N/A 08/02/2023   Procedure: ESOPHAGOGASTRODUODENOSCOPY (EGD) WITH PROPOFOL ;  Surgeon: Leigh Elspeth SQUIBB, MD;  Location: [facility];  Service: Gastroenterology;  Laterality: N/A;   RIGHT HEART CATH N/A 09/27/2017   Procedure: RIGHT HEART CATH;  Surgeon: Cherrie Toribio SAUNDERS, MD;  Location: [facility];  Service: Cardiovascular;  Laterality: N/A;   RIGHT HEART CATH N/A 09/05/2019   Procedure: RIGHT HEART CATH;  Surgeon: Cherrie Toribio SAUNDERS, MD;  Location: [facility];  Service: Cardiovascular;  Laterality: N/A;   RIGHT/LEFT HEART CATH AND CORONARY ANGIOGRAPHY N/A 08/18/2021   Procedure: RIGHT/LEFT HEART CATH AND CORONARY ANGIOGRAPHY;  Surgeon: Cherrie Toribio SAUNDERS, MD;  Location: [facility];  Service: Cardiovascular;  Laterality: N/A;   UTERINE FIBROID SURGERY      Social History:   reports that she has never smoked. She has never used smokeless tobacco. She reports that she does not drink alcohol and does not use drugs.  Allergies  Allergen Reactions   Tocilizumab  Shortness Of Breath, Swelling and Other (See Comments)    Swelling and shortness of breath sharpe pains in back and chest tightness. Vancomycin Infusion Reaction   Lisinopril Hives and Swelling    Family History  Problem Relation Age of Onset   Hypertension Father    Hypertension Sister    Hypertension Brother    Diabetes Maternal Grandfather    Diabetes Maternal Uncle    Diabetes Mother    Colon cancer Neg Hx      Prior to Admission medications   Medication Sig Start Date End Date Taking? Authorizing Provider  acetaminophen  (TYLENOL ) 325 MG tablet Take 2 tablets (650 mg total) by mouth every 6 (six) hours  as needed for mild pain, fever or headache (or Fever >/= 101). Patient taking differently: Take 325-650 mg by mouth as needed for mild pain (pain score 1-3), fever or headache (or Fever >/= 101). 02/15/20  Yes Pearlean Manus, MD  NP THYROID  120 MG tablet Take 1 tablet (120 mg total) by mouth daily before breakfast. 12/25/23  Yes Tobie Suzzane POUR, MD  aspirin  81 MG chewable tablet Chew 1 tablet (81 mg total) by mouth daily. 12/27/23     macitentan  (OPSUMIT ) 10 MG tablet Take 1 tablet (10 mg total) by mouth daily. 08/09/23   Bensimhon, Toribio SAUNDERS, MD  traZODone  (DESYREL ) 50 MG tablet Take 1/2-1 tablets (25-50 mg total) by mouth at bedtime as needed for sleep. 12/28/23   Tobie Suzzane POUR, MD  PARoxetine  (PAXIL ) 10 MG tablet Take 1 tablet (10 mg total) by mouth daily as directed 10/07/20 12/02/20      Physical Exam: Vitals:   02/20/24 0119 02/20/24 0127 02/20/24 0130 02/20/24 0200  BP:  (!) 183/104 (!) 169/106 (!) 152/93  Pulse: (!) 111 (!) 110 (!) 111 (!) 109  Resp: 20 (!) 25 (!) 33 16  Temp:  98 F (36.7 C)    TempSrc:      SpO2: 97% 98% 96% 97%    Constitutional: NAD, no pallor  or diaphoresis   Eyes: PERTLA, lids and conjunctivae normal ENMT: Mucous membranes are moist. Posterior pharynx clear of any exudate or lesions.   Neck: supple, no masses  Respiratory: no wheezing, no crackles. No accessory muscle use.  Cardiovascular: S1 & S2 heard, regular rate and rhythm. Trace lower extremity edema.  Abdomen: No tenderness, soft. Bowel sounds active.  Musculoskeletal: no clubbing / cyanosis. No joint deformity upper and lower extremities.   Skin: no significant rashes, lesions, ulcers. Warm, dry, well-perfused. Neurologic: CN 2-12 grossly intact. Moving all extremities spontaneously, not cooperating with exam. Not answering questions.    Labs and Imaging on Admission: I have personally reviewed following labs and imaging studies  CBC: Recent Labs  Lab 02/19/24 2045  WBC 7.5  NEUTROABS 3.8   HGB 13.3  HCT 42.7  MCV 88.2  PLT 211   Basic Metabolic Panel: Recent Labs  Lab 02/19/24 2045  NA 141  K 3.7  CL 102  CO2 25  GLUCOSE 95  BUN 19  CREATININE 0.75  CALCIUM 11.0*   GFR: CrCl cannot be calculated (Unknown ideal weight.). Liver Function Tests: Recent Labs  Lab 02/19/24 2045  AST 22  ALT 12  ALKPHOS 90  BILITOT 0.5  PROT 8.1  ALBUMIN 4.5   No results for input(s): LIPASE, AMYLASE in the last 168 hours. No results for input(s): AMMONIA in the last 168 hours. Coagulation Profile: Recent Labs  Lab 02/19/24 2045  INR 1.1   Cardiac Enzymes: No results for input(s): CKTOTAL, CKMB, CKMBINDEX, TROPONINI in the last 168 hours. BNP (last 3 results) No results for input(s): PROBNP in the last 8760 hours. HbA1C: No results for input(s): HGBA1C in the last 72 hours. CBG: Recent Labs  Lab 02/19/24 1953  GLUCAP 109*   Lipid Profile: No results for input(s): CHOL, HDL, LDLCALC, TRIG, CHOLHDL, LDLDIRECT in the last 72 hours. Thyroid  Function Tests: No results for input(s): TSH, T4TOTAL, FREET4, T3FREE, THYROIDAB in the last 72 hours. Anemia Panel: No results for input(s): VITAMINB12, FOLATE, FERRITIN, TIBC, IRON, RETICCTPCT in the last 72 hours. Urine analysis:    Component Value Date/Time   COLORURINE COLORLESS (A) 07/25/2023 1711   APPEARANCEUR Cloudy (A) 09/14/2023 0900   LABSPEC 1.018 07/25/2023 1711   PHURINE 7.5 07/25/2023 1711   GLUCOSEU Negative 09/14/2023 0900   HGBUR NEGATIVE 07/25/2023 1711   BILIRUBINUR Negative 09/14/2023 0900   KETONESUR NEGATIVE 07/25/2023 1711   PROTEINUR 2+ (A) 09/14/2023 0900   PROTEINUR 30 (A) 07/25/2023 1711   NITRITE Negative 09/14/2023 0900   NITRITE NEGATIVE 07/25/2023 1711   LEUKOCYTESUR 3+ (A) 09/14/2023 0900   LEUKOCYTESUR LARGE (A) 07/25/2023 1711   Sepsis Labs: @LABRCNTIP (procalcitonin:4,lacticidven:4) )No results found for this or any previous  visit (from the past 240 hours).   Radiological Exams on Admission: CT HEAD CODE STROKE WO CONTRAST (LKW 0-4.5h, LVO 0-24h) Result Date: 02/19/2024 EXAM: CT HEAD WITHOUT CONTRAST 02/19/2024 08:29:01 PM TECHNIQUE: CT of the head was performed without the administration of intravenous contrast. Automated exposure control, iterative reconstruction, and/or weight based adjustment of the mA/kV was utilized to reduce the radiation dose to as low as reasonably achievable. COMPARISON: Prior study from 08/22/2023. CLINICAL HISTORY: Neuro deficit, acute, stroke suspected. Pt family member states that pt was eating dinner tonight and that her face went blank and pt was altered. States this was around 17. Pt is alert but not answering any questions and can not get any further information about how the pt feels. Pt refusing to allow  us  to take blood pressure. Pt saying things such as I'm hot and help me but will not answer any questions. FINDINGS: BRAIN AND VENTRICLES: Patchy hypodensity involving the supratentorial cerebral white matter, consistent with chronic small vessel ischemic disease. ASPECTS is 10. No acute hemorrhage. No evidence of acute infarct. No hydrocephalus. No extra-axial collection. No mass effect or midline shift. ORBITS: No acute abnormality. SINUSES: No acute abnormality. SOFT TISSUES AND SKULL: No acute soft tissue abnormality. No skull fracture. IMPRESSION: 1. No acute intracranial abnormality. Aspects is 10. 2. Changes of chronic microvascular ischemic disease involving the supratentorial subcortical white matter. 3. Findings communicated to Dr. Zammit by telephone at 8:35 pm on 02/19/2024. Electronically signed by: Morene Hoard MD 02/19/2024 08:42 PM EDT RP Workstation: [id number]    EKG: Independently reviewed. Sinus rhythm.   Assessment/Plan   1. Acute encephalopathy  - No acute findings on head CT - She had similar presentation at [facility] in August where there were no acute MRI  brain findings and she was treated for UTI - Check ammonia, TSH, RPR, B12, and MRI brain, continue BP-control, use delirium precautions   2. Hypertensive crisis  - BP as high as 209/135  - No longer has any antihypertensives on med list, family unsure of why they were discontinued  - Continue to treat as-needed for now    3. Scleroderma  - Followed by rheumatology, previously on Cellcept , family unsure why it was discontinued   4. PAH  - WHO groups I & III  - Continue macitentan    5. ILD  - Followed by pulmonology, being considered for [facility] at [facility] - Previously on Ofev  and Cellcept    6. Chronic diastolic CHF  - Appears compensated   7. Hypothyroidism  - Continue Armour thyroid     DVT prophylaxis: Lovenox   Code Status: Full  Level of Care: Level of care: Telemetry Family Communication: Husband at bedside Disposition Plan:  Patient is from: Home  Anticipated d/c is to: Home  Anticipated d/c date is: 10/15 or 02/21/24  Patient currently: Pending additional labs, MRI brain, clinical improvement  Consults called: Teleneurology  Admission status: Observation     Evalene GORMAN Sprinkles, MD [facility]  02/20/2024, 2:35 AM

## 2024-02-20 NOTE — Progress Notes (Signed)
 Progress Note   Patient: Karen [name] FMW:[id number] DOB: Jun 10, 1970 DOA: 02/19/2024  DOS: the patient was seen and examined on 02/20/2024   Brief hospital course:  53 y.o. female with medical history significant for hypertension, hypothyroidism, Sjogren syndrome, and scleroderma with ILD and pulmonary hypertension who presents with confusion, agitation, and headache.  Found to have sbp > 200.  Assessment and Plan:  Acute metabolic encephalopathy - CT head negative.  Reportedly similar presentation at [facility] in August, negative MRI at that time.  Initially having confusion and word finding difficulty.  Subsequently resolved.  MRI ordered however unable to obtain secondary to artifact from patient's hair weave.  Encephalopathy appears resolved.  Hypertensive emergency/uncontrolled hypertension - Systolic blood pressure greater than 200 with noted encephalopathy on presentation.  Responded well to labetalol /hydralazine .  Systolic blood pressures in the 160s.  Etiology unclear.  Given patient's history of scleroderma, scleroderma renal crisis considered however no acute kidney injury observed.  Could have been exacerbated by headache or noncompliance with antihypertensives.  Will initiate amlodipine  5 mg daily plus chlorthalidone 25 mg daily.  Monitor blood pressure closely.  Hydralazine  IV as needed.  Mild hypercalcemia - Etiology unclear.  Calcium 11.0 on presentation.  Noted intermittent history of elevated calcium.  Will initiate NS at 100 mL/h.  Recheck showing mild improvement this morning, down to 10.6.  Will recheck calcium in AM.  Intractable headache - Left frontal area, showing improvement this morning but not resolved.  Toradol  on board.  As needed opioids.  Scleroderma - Followed by rheumatology in the outpatient setting.  Was previously on CellCept , no longer.  Pulmonary artery hypertension - Continue macitentan .  Likely related to scleroderma.  Interstitial lung disease -  Followed by pulmonology, being considered for transplant at [facility].  Previously on Ofev  and CellCept .  Chronic HFpEF - Does not appear to be acute exacerbation.  Hypothyroidism - [facility] Thyroid  on board.  Goals of care - Showing improvement this morning.  Unfortunately unable to get MRI.  Will continue to work on blood pressure control and symptomatic management.  Anticipate discharge tomorrow.  Subjective: Patient resting comfortably this morning although is still in mild distress.  Still complaining of headache however mildly improved presentation.  No longer having difficulty speaking or confusion.  Husband at bedside corroborating history.  Patient currently denies any fever, difficulty speaking, nausea, vomiting, shortness of breath, chest pain, abdominal pain.  No weakness in the extremities.  Physical Exam:  Vitals:   02/20/24 1045 02/20/24 1100 02/20/24 1249 02/20/24 1309  BP:  (!) 153/80 (!) 158/94 (!) 146/101  Pulse:  95 (!) 107 (!) 101  Resp: (!) 24  16 18   Temp:   97.7 F (36.5 C) 98.2 F (36.8 C)  TempSrc:   Oral Oral  SpO2:  94% 95% 98%  Weight:    57.2 kg  Height:    5' 4 (1.626 m)    GENERAL:  Alert, pleasant, no acute distress  HEENT:  EOMI CARDIOVASCULAR:  RRR, no murmurs appreciated RESPIRATORY:  Clear to auscultation, no wheezing, rales, or rhonchi GASTROINTESTINAL:  Soft, nontender, nondistended EXTREMITIES:  No LE edema bilaterally NEURO:  No new focal deficits appreciated SKIN:  No rashes noted PSYCH:  Appropriate mood and affect     Data Reviewed:  Imaging Studies: CT HEAD CODE STROKE WO CONTRAST (LKW 0-4.5h, LVO 0-24h) Result Date: 02/19/2024 EXAM: CT HEAD WITHOUT CONTRAST 02/19/2024 08:29:01 PM TECHNIQUE: CT of the head was performed without the administration of intravenous contrast. Automated exposure control,  iterative reconstruction, and/or weight based adjustment of the mA/kV was utilized to reduce the radiation dose to as low as reasonably  achievable. COMPARISON: Prior study from 08/22/2023. CLINICAL HISTORY: Neuro deficit, acute, stroke suspected. Pt family member states that pt was eating dinner tonight and that her face went blank and pt was altered. States this was around 22. Pt is alert but not answering any questions and can not get any further information about how the pt feels. Pt refusing to allow us  to take blood pressure. Pt saying things such as I'm hot and help me but will not answer any questions. FINDINGS: BRAIN AND VENTRICLES: Patchy hypodensity involving the supratentorial cerebral white matter, consistent with chronic small vessel ischemic disease. ASPECTS is 10. No acute hemorrhage. No evidence of acute infarct. No hydrocephalus. No extra-axial collection. No mass effect or midline shift. ORBITS: No acute abnormality. SINUSES: No acute abnormality. SOFT TISSUES AND SKULL: No acute soft tissue abnormality. No skull fracture. IMPRESSION: 1. No acute intracranial abnormality. Aspects is 10. 2. Changes of chronic microvascular ischemic disease involving the supratentorial subcortical white matter. 3. Findings communicated to Dr. Zammit by telephone at 8:35 pm on 02/19/2024. Electronically signed by: Morene Hoard MD 02/19/2024 08:42 PM EDT RP Workstation: [id number]   NCV with EMG(electromyography) Result Date: 01/23/2024 Onita Duos, MD     01/24/2024  9:02 PM     Full Name: [name] Gender: Female MRN #: [id number] Date of Birth: [birth year]   Visit Date: 01/23/2024 14:41 Age: 53 Years Examining Physician: Onita Duos Referring Physician: Onita Duos Height: 5 feet 5 inch History: 53 year old female with slow onset bilateral feet paresthesia, intermittent fingertips paresthesia Summary of the test: Nerve conduction study: Bilateral sural, superficial peroneal sensory responses were absent. Bilateral peroneal to EDB tibial motor responses were absent. Bilateral ulnar sensory and motor responses were within normal limit.  Bilateral median sensory and motor responses were also within normal limit. Left median, ulnar transcarpal tunnel response showed no significant difference Electromyography: Selected needle examination of bilateral lower extremity muscles, lumbosacral paraspinal, right abductor hallucis longus, right upper extremity muscle, cervical paraspinal muscle performed. There was increased insertional activity, 2+ spontaneous activity, very small complex motor unit potential noted at the right abductor hallucis longus muscle. Conclusion: This is an abnormal study.  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  There is no evidence of upper extremity focal neuropathy ------------------------------- Duos Onita. M.D. Ph.D. [facility] [address] [place], [place] [address] Tel: [phone] Fax: [phone] Verbal informed consent was obtained from the patient, patient was informed of potential risk of procedure, including bruising, bleeding, hematoma formation, infection, muscle weakness, muscle pain, numbness, among others.     MNC   Nerve / Sites Muscle Latency Ref. Amplitude Ref. Rel Amp Segments Distance Velocity Ref. Area   ms ms mV mV %  cm m/s m/s mVms R Median - APB    Wrist APB 3.1 <=4.4 6.1 >=4.0 100 Wrist - APB 7   14.1    Upper arm APB 6.6  5.9  95.3 Upper arm - Wrist 21 60 >=49 13.9 L Median - APB    Wrist APB 3.0 <=4.4 6.5 >=4.0 100 Wrist - APB 7   21.8    Upper arm APB 6.5  6.1  94 Upper arm - Wrist 24 67 >=49 22.5 R Ulnar - ADM    Wrist ADM 2.4 <=3.3 9.1 >=6.0 100 Wrist - ADM 7   28.1    B.Elbow ADM  4.4  8.7  95.5 B.Elbow - Wrist 12 59 >=49 26.6    A.Elbow ADM 6.9  8.7  100 A.Elbow - B.Elbow 13 52 >=49 26.9 L Ulnar - ADM    Wrist ADM 2.7 <=3.3 7.8 >=6.0 100 Wrist - ADM 7   24.9    B.Elbow ADM 4.5  8.0  103 B.Elbow - Wrist 12 65 >=49 24.5    A.Elbow ADM 6.9  8.3  103 A.Elbow - B.Elbow 13 54 >=49 26.6 R Peroneal - EDB    Ankle EDB NR <=6.5 NR >=2.0  NR Ankle - EDB 9   NR    Fib head EDB      Fib head - Ankle   >=44         Pop fossa - Ankle     L Peroneal - EDB    Ankle EDB NR <=6.5 NR >=2.0 NR Ankle - EDB 9   NR        Pop fossa - Ankle     R Tibial - AH    Ankle AH NR <=5.8 NR >=4.0 NR Ankle - AH 9   NR    Pop fossa AH      Pop fossa - Ankle   >=41  L Tibial - AH    Ankle AH NR <=5.8 NR >=4.0 NR Ankle - AH 9   NR                   SNC   Nerve / Sites Rec. Site Peak Lat Ref.  Amp Ref. Segments Distance Peak Diff Ref.   ms ms V V  cm ms ms R Sural - Ankle (Calf)    Calf Ankle NR <=4.4 NR >=6 Calf - Ankle 14   L Sural - Ankle (Calf)    Calf Ankle NR <=4.4 NR >=6 Calf - Ankle 14   L Superficial peroneal - Ankle    Lat leg Ankle NR <=4.4 NR >=6 Lat leg - Ankle 14   R Superficial peroneal - Ankle    Lat leg Ankle NR <=4.4 NR >=6 Lat leg - Ankle 14   L Median, Ulnar - Transcarpal comparison    Median Palm Wrist 2.0 <=2.2 37 >=35 Median Palm - Wrist 8      Ulnar Palm Wrist 1.8 <=2.2 9 >=12 Ulnar Palm - Wrist 8         Median Palm - Ulnar Palm  0.2 <=0.4 R Median - Orthodromic (Dig II, Mid palm)    Dig II Wrist 2.9 <=3.4 12 >=10 Dig II - Wrist 13   L Median - Orthodromic (Dig II, Mid palm)    Dig II Wrist 3.0 <=3.4 10 >=10 Dig II - Wrist 13   R Ulnar - Orthodromic, (Dig V, Mid palm)    Dig V Wrist 2.8 <=3.1 5 >=5 Dig V - Wrist 11   L Ulnar - Orthodromic, (Dig V, Mid palm)    Dig V Wrist 2.7 <=3.1 6 >=5 Dig V - Wrist 51                       F  Wave   Nerve F Lat Ref.  ms ms R Ulnar - ADM 25.8 <=32.0 L Ulnar - ADM 25.5 <=32.0       EMG Summary Table   Spontaneous MUAP Recruitment Muscle IA Fib PSW Fasc Other Amp Dur. Poly Pattern R. Tibialis anterior Normal None None None _______ Normal Normal  Normal Normal R. Tibialis posterior Normal None None None _______ Normal Normal Normal Normal R. Peroneus longus Normal None None None _______ Normal Normal Normal Normal R. Gastrocnemius (Medial head) Normal None None None _______ Normal Normal Normal Normal R. Vastus  lateralis Normal None None None _______ Normal Normal Normal Normal L. Tibialis anterior Normal None None None _______ Normal Normal Normal Normal L. Tibialis posterior Normal None None None _______ Normal Normal Normal Normal L. Peroneus longus Normal None None None _______ Normal Normal Normal Normal L. Gastrocnemius (Medial head) Normal None None None _______ Normal Normal Normal Normal L. Vastus lateralis Normal None None None _______ Normal Normal Normal Normal R. Lumbar paraspinals (low) Normal None None None _______ Normal Normal Normal Normal R. Lumbar paraspinals (mid) Normal None None None _______ Normal Normal Normal Normal L. Lumbar paraspinals (low) Normal None None None _______ Normal Normal Normal Normal L. Lumbar paraspinals (mid) Normal None None None _______ Normal Normal Normal Normal R. First dorsal interosseous Normal None None None _______ Normal Normal Normal Normal R. Pronator teres Normal None None None _______ Normal Normal Normal Normal R. Biceps brachii Normal None None None _______ Normal Normal Normal Normal R. Deltoid Normal None None None _______ Normal Normal Normal Normal R. Triceps brachii Normal None None None _______ Normal Normal Normal Normal R. Cervical paraspinals Normal None None None _______ Normal Normal Normal Normal R. Abductor hallucis Increased 2+ 2+ None _______ Normal Normal Normal Reduced     There are no new results to review at this time.  Previous records (including but not limited to H&P, progress notes, nursing notes, TOC management) were reviewed in assessment of this patient.  Labs: CBC: Recent Labs  Lab 02/19/24 2045 02/20/24 0542  WBC 7.5 8.8  NEUTROABS 3.8  --   HGB 13.3 12.8  HCT 42.7 40.0  MCV 88.2 87.0  PLT 211 231   Basic Metabolic Panel: Recent Labs  Lab 02/19/24 2045 02/20/24 0542  NA 141 140  K 3.7 3.3*  CL 102 105  CO2 25 21*  GLUCOSE 95 102*  BUN 19 15  CREATININE 0.75 0.68  CALCIUM 11.0* 10.6*   Liver Function  Tests: Recent Labs  Lab 02/19/24 2045  AST 22  ALT 12  ALKPHOS 90  BILITOT 0.5  PROT 8.1  ALBUMIN 4.5   CBG: Recent Labs  Lab 02/19/24 1953  GLUCAP 109*    Scheduled Meds:  aspirin   81 mg Oral Daily   enoxaparin  (LOVENOX ) injection  40 mg Subcutaneous Q24H   macitentan   10 mg Oral Daily   sodium chloride  flush  3 mL Intravenous Q12H   thyroid   120 mg Oral Q0600   Continuous Infusions: PRN Meds:.acetaminophen  **OR** acetaminophen , hydrALAZINE , ketorolac , ondansetron  **OR** ondansetron  (ZOFRAN ) IV, oxyCODONE , senna-docusate  Family Communication: Husband at bedside  Disposition: Status is: Inpatient Remains inpatient appropriate because: Hypertensive urgency/emergency     Time spent: 52 minutes  Length of inpatient stay: 0 days  Author: Carliss LELON Canales, DO 02/20/2024 2:19 PM  For on call review [email or web address].

## 2024-02-20 NOTE — Hospital Course (Signed)
 53 y.o. female with medical history significant for hypertension, hypothyroidism, Sjogren syndrome, and scleroderma with ILD and pulmonary hypertension who presents with confusion, agitation, and headache.  Found to have sbp > 200.   Assessment and Plan:   Acute metabolic encephalopathy - CT head negative.  Reportedly similar presentation at [facility] in August, negative MRI at that time.  Initially having confusion and word finding difficulty.  Subsequently resolved.  MRI ordered however unable to obtain secondary to artifact from patient's hair weave.  Encephalopathy appears resolved.   Hypertensive emergency/uncontrolled hypertension - Systolic blood pressure greater than 200 with noted encephalopathy on presentation.  Responded well to labetalol /hydralazine .  Systolic blood pressures in the 160s.  Etiology unclear.  Given patient's history of scleroderma, scleroderma renal crisis considered however no acute kidney injury observed.  Could have been exacerbated by headache or noncompliance with antihypertensives.  Will initiate amlodipine  5 mg daily plus chlorthalidone 25 mg daily.  Monitor blood pressure closely.  Hydralazine  IV as needed.   Mild hypercalcemia - Etiology unclear.  Calcium 11.0 on presentation.  Noted intermittent history of elevated calcium.  Will initiate NS at 100 mL/h.  Recheck showing mild improvement this morning, down to 10.6.  Will recheck calcium in AM.   Intractable headache - Left frontal area, showing improvement this morning but not resolved.  Toradol  on board.  As needed opioids.   Scleroderma - Followed by rheumatology in the outpatient setting.  Was previously on CellCept , no longer.   Pulmonary artery hypertension - Continue macitentan .  Likely related to scleroderma.   Interstitial lung disease - Followed by pulmonology, being considered for transplant at [facility].  Previously on Ofev  and CellCept .   Chronic HFpEF - Does not appear to be acute exacerbation.    Hypothyroidism - [facility] Thyroid  on board.   Goals of care - Showing improvement this morning.  Unfortunately unable to get MRI.  Will continue to work on blood pressure control and symptomatic management.  Anticipate discharge tomorrow.

## 2024-02-20 NOTE — ED Notes (Signed)
 Pt in bed with eyes closed, pt arouses easily to verbal stim, pt denies headache or dizziness.

## 2024-02-20 NOTE — TOC CM/SW Note (Signed)
 Transition of Care [facility]) - Inpatient Brief Assessment   Patient Details  Name: Karen [name] MRN: [id number] Date of Birth: Jul 22, 1970  Transition of Care [facility]) CM/SW Contact:    Lucie Lunger, LCSWA Phone Number: 02/20/2024, 8:57 AM   Clinical Narrative: Transition of Care Department [facility]) has reviewed patient and no TOC needs have been identified at this time. We will continue to monitor patient advancement through interdiciplinary progression rounds. If new patient transition needs arise, please place a TOC consult.  Transition of Care Asessment: Insurance and Status: Insurance coverage has been reviewed Patient has primary care physician: Yes Home environment has been reviewed: From home Prior level of function:: Independent Prior/Current Home Services: No current home services Social Drivers of Health Review: SDOH reviewed no interventions necessary Readmission risk has been reviewed: Yes Transition of care needs: no transition of care needs at this time

## 2024-02-20 NOTE — ED Notes (Signed)
 Per MRI, pt has metal clips in her hair that need to be removed, pt states that she is ok for the clips to be cut out of her hair piece, two clips removed, MRI notified, pt awaits MRI.

## 2024-02-21 ENCOUNTER — Other Ambulatory Visit: Payer: Self-pay

## 2024-02-21 ENCOUNTER — Inpatient Hospital Stay (HOSPITAL_COMMUNITY)

## 2024-02-21 ENCOUNTER — Ambulatory Visit: Admitting: Internal Medicine

## 2024-02-21 ENCOUNTER — Other Ambulatory Visit (HOSPITAL_BASED_OUTPATIENT_CLINIC_OR_DEPARTMENT_OTHER): Payer: Self-pay

## 2024-02-21 ENCOUNTER — Other Ambulatory Visit (HOSPITAL_COMMUNITY): Payer: Self-pay

## 2024-02-21 DIAGNOSIS — N3000 Acute cystitis without hematuria: Secondary | ICD-10-CM

## 2024-02-21 DIAGNOSIS — J849 Interstitial pulmonary disease, unspecified: Secondary | ICD-10-CM | POA: Diagnosis not present

## 2024-02-21 DIAGNOSIS — G9340 Encephalopathy, unspecified: Secondary | ICD-10-CM | POA: Diagnosis not present

## 2024-02-21 DIAGNOSIS — I160 Hypertensive urgency: Secondary | ICD-10-CM | POA: Diagnosis not present

## 2024-02-21 DIAGNOSIS — I272 Pulmonary hypertension, unspecified: Secondary | ICD-10-CM | POA: Diagnosis not present

## 2024-02-21 LAB — BASIC METABOLIC PANEL WITH GFR
Anion gap: 10 (ref 5–15)
BUN: 16 mg/dL (ref 6–20)
CO2: 24 mmol/L (ref 22–32)
Calcium: 9.9 mg/dL (ref 8.9–10.3)
Chloride: 106 mmol/L (ref 98–111)
Creatinine, Ser: 0.6 mg/dL (ref 0.44–1.00)
GFR, Estimated: >60 mL/min (ref >60)
Glucose, Bld: 87 mg/dL (ref 70–99)
Potassium: 3.1 mmol/L — ABNORMAL LOW (ref 3.5–5.1)
Sodium: 140 mmol/L (ref 135–145)

## 2024-02-21 LAB — CBC
HCT: 39.3 % (ref 36.0–46.0)
Hemoglobin: 12.1 g/dL (ref 12.0–15.0)
MCH: 27.6 pg (ref 26.0–34.0)
MCHC: 30.8 g/dL (ref 30.0–36.0)
MCV: 89.5 fL (ref 80.0–100.0)
Platelets: 189 K/uL (ref 150–400)
RBC: 4.39 MIL/uL (ref 3.87–5.11)
RDW: 14.5 % (ref 11.5–15.5)
WBC: 7.7 K/uL (ref 4.0–10.5)
nRBC: 0 % (ref 0.0–0.2)

## 2024-02-21 LAB — MAGNESIUM: Magnesium: 1.4 mg/dL — ABNORMAL LOW (ref 1.7–2.4)

## 2024-02-21 MED ORDER — ONDANSETRON HCL 4 MG PO TABS
4.0000 mg | ORAL_TABLET | Freq: Four times a day (QID) | ORAL | 0 refills | Status: DC | PRN
  Filled 2024-02-21: qty 20, 5d supply, fill #0

## 2024-02-21 MED ORDER — CHLORTHALIDONE 25 MG PO TABS
25.0000 mg | ORAL_TABLET | Freq: Every day | ORAL | 0 refills | Status: DC
  Filled 2024-02-21: qty 30, 30d supply, fill #0

## 2024-02-21 MED ORDER — HYDRALAZINE HCL 50 MG PO TABS
25.0000 mg | ORAL_TABLET | Freq: Three times a day (TID) | ORAL | 0 refills | Status: DC
  Filled 2024-02-21: qty 90, 60d supply, fill #0

## 2024-02-21 MED ORDER — CHLORTHALIDONE 25 MG PO TABS
25.0000 mg | ORAL_TABLET | Freq: Every day | ORAL | 1 refills | Status: DC
  Filled 2024-02-21 – 2024-04-07 (×3): qty 30, 30d supply, fill #0

## 2024-02-21 MED ORDER — SULFAMETHOXAZOLE-TRIMETHOPRIM 800-160 MG PO TABS
1.0000 | ORAL_TABLET | Freq: Two times a day (BID) | ORAL | 0 refills | Status: DC
  Filled 2024-02-21: qty 10, 5d supply, fill #0

## 2024-02-21 MED ORDER — CEPHALEXIN 500 MG PO CAPS
500.0000 mg | ORAL_CAPSULE | Freq: Two times a day (BID) | ORAL | 0 refills | Status: DC
  Filled 2024-02-21: qty 10, 5d supply, fill #0

## 2024-02-21 MED ORDER — ONDANSETRON HCL 4 MG PO TABS
4.0000 mg | ORAL_TABLET | Freq: Four times a day (QID) | ORAL | 0 refills | Status: AC | PRN
  Filled 2024-02-21: qty 15, 4d supply, fill #0

## 2024-02-21 MED ORDER — HYDRALAZINE HCL 50 MG PO TABS
25.0000 mg | ORAL_TABLET | Freq: Three times a day (TID) | ORAL | 1 refills | Status: DC
  Filled 2024-02-21: qty 90, 60d supply, fill #0

## 2024-02-21 NOTE — Discharge Summary (Addendum)
 Physician Discharge Summary   Patient: Karen [name] MRN: [id number] DOB: 01/30/1971  Admit date:     02/19/2024  Discharge date: 02/21/24  Discharge Physician: Carliss LELON Canales   PCP: Tobie Suzzane POUR, MD   Recommendations at discharge:    Pt to be discharged home.   If you experience worsening fever, chills, chest pain, shortness of breath, or other concerning symptoms, please call your PCP or go to the emergency department immediately.  ADDENDUM - switched bactrim  to keflex  per patient preference.  Sent scripts to [facility] outpatient pharmacy.  Discharge Diagnoses: Principal Problem:   Acute encephalopathy Active Problems:   Scleroderma (HCC)   Pulmonary hypertension (HCC)   Hypothyroidism   Chronic diastolic CHF (congestive heart failure) (HCC)   Hypertensive urgency   ILD (interstitial lung disease) (HCC)  Resolved Problems:   * No resolved hospital problems. *   Hospital Course:  53 y.o. female with medical history significant for hypertension, hypothyroidism, Sjogren syndrome, and scleroderma with ILD and pulmonary hypertension who presents with confusion, agitation, and headache.  Found to have sbp > 200.   Assessment and Plan:   Acute metabolic encephalopathy - CT head negative.  Reportedly similar presentation at [facility] in August, negative MRI at that time.  Initially having confusion and word finding difficulty.  Subsequently resolved.  MRI ordered however unable to obtain secondary to artifact from patient's hair weave.  Encephalopathy appears resolved.   Hypertensive emergency/uncontrolled hypertension - Systolic blood pressure greater than 200 with noted encephalopathy on presentation.  Responded well to labetalol /hydralazine .  Systolic blood pressures in the 160s.  Etiology unclear.  Given patient's history of scleroderma, scleroderma renal crisis considered however no acute kidney injury observed.  Could have been exacerbated by headache or noncompliance with  antihypertensives.  Initiated hydralazine  plus chlorthalidone daily.  Will give prescription for hydralazine  50 mg 3 times daily as well as chlorthalidone 25 mg daily.  Recommend following closely with cardiology/PCP in the outpatient setting to titrate antihypertensive regiment.    Mild hypercalcemia - Etiology unclear.  Calcium 11.0 on presentation.  Noted intermittent history of elevated calcium.  Resolved after IV fluid hydration.  If hypercalcemia returns, may warrant workup with PTH.   Intractable headache - Left frontal area, showing improvement.  OTC pain medications as needed on discharge.  Scleroderma - Followed by rheumatology in the outpatient setting.  Was previously on CellCept , no longer.  Was previously on ACE inhibitor however had angioedema reaction and has since been discontinued.  Should be noted given her risk of scleroderma renal crisis.   Pulmonary artery hypertension - Continue macitentan .  Likely related to scleroderma.   Interstitial lung disease - Followed by pulmonology, being considered for transplant at [facility].  Previously on Ofev  and CellCept .   Chronic HFpEF - Does not appear to be acute exacerbation.   Hypothyroidism - [facility] Thyroid  on board.  Possible urinary tract infection - UA noting LE, WBCs, bacteria.  No leukocytosis or symptoms however did have confusion on presentation.  Will empirically give p.o. Bactrim  to take as directed upon discharge.   Consultants: Neurology Procedures performed: None Disposition: Home Diet recommendation:  Discharge Diet Orders (From admission, onward)     Start     Ordered   02/21/24 0000  Diet - low sodium heart healthy        02/21/24 1117           Cardiac diet  DISCHARGE MEDICATION: Allergies as of 02/21/2024       Reactions  Tocilizumab  Shortness Of Breath, Swelling, Other (See Comments)   Swelling and shortness of breath sharpe pains in back and chest tightness. Vancomycin Infusion Reaction    Lisinopril Hives, Swelling        Medication List     TAKE these medications    acetaminophen  325 MG tablet Commonly known as: TYLENOL  Take 2 tablets (650 mg total) by mouth every 6 (six) hours as needed for mild pain, fever or headache (or Fever >/= 101). What changed:  how much to take when to take this   Aspirin  Low Dose 81 MG chewable tablet Generic drug: aspirin  Chew 1 tablet (81 mg total) by mouth daily.   chlorthalidone 25 MG tablet Commonly known as: HYGROTON Take 1 tablet (25 mg total) by mouth daily. Start taking on: February 22, 2024   hydrALAZINE  50 MG tablet Commonly known as: APRESOLINE  Take 0.5 tablets (25 mg total) by mouth 3 (three) times daily.   macitentan  10 MG tablet Commonly known as: OPSUMIT  Take 1 tablet (10 mg total) by mouth daily.   NP Thyroid  120 MG tablet Generic drug: thyroid  Take 1 tablet (120 mg total) by mouth daily before breakfast.   ondansetron  4 MG tablet Commonly known as: ZOFRAN  Take 1 tablet (4 mg total) by mouth every 6 (six) hours as needed for nausea.   sulfamethoxazole -trimethoprim  800-160 MG tablet Commonly known as: Bactrim  DS Take 1 tablet by mouth 2 (two) times daily for 5 days.   traZODone  50 MG tablet Commonly known as: DESYREL  Take 1/2-1 tablets (25-50 mg total) by mouth at bedtime as needed for sleep.         Discharge Exam: Filed Weights   02/20/24 1309  Weight: 57.2 kg    GENERAL:  Alert, pleasant, no acute distress  HEENT:  EOMI CARDIOVASCULAR:  RRR, no murmurs appreciated RESPIRATORY:  Clear to auscultation, no wheezing, rales, or rhonchi GASTROINTESTINAL:  Soft, nontender, nondistended EXTREMITIES:  No LE edema bilaterally NEURO:  No new focal deficits appreciated SKIN:  No rashes noted PSYCH:  Appropriate mood and affect     Condition at discharge: improving  The results of significant diagnostics from this hospitalization (including imaging, microbiology, ancillary and laboratory) are  listed below for reference.   Imaging Studies: CT HEAD CODE STROKE WO CONTRAST (LKW 0-4.5h, LVO 0-24h) Result Date: 02/19/2024 EXAM: CT HEAD WITHOUT CONTRAST 02/19/2024 08:29:01 PM TECHNIQUE: CT of the head was performed without the administration of intravenous contrast. Automated exposure control, iterative reconstruction, and/or weight based adjustment of the mA/kV was utilized to reduce the radiation dose to as low as reasonably achievable. COMPARISON: Prior study from 08/22/2023. CLINICAL HISTORY: Neuro deficit, acute, stroke suspected. Pt family member states that pt was eating dinner tonight and that her face went blank and pt was altered. States this was around 94. Pt is alert but not answering any questions and can not get any further information about how the pt feels. Pt refusing to allow us  to take blood pressure. Pt saying things such as I'm hot and help me but will not answer any questions. FINDINGS: BRAIN AND VENTRICLES: Patchy hypodensity involving the supratentorial cerebral white matter, consistent with chronic small vessel ischemic disease. ASPECTS is 10. No acute hemorrhage. No evidence of acute infarct. No hydrocephalus. No extra-axial collection. No mass effect or midline shift. ORBITS: No acute abnormality. SINUSES: No acute abnormality. SOFT TISSUES AND SKULL: No acute soft tissue abnormality. No skull fracture. IMPRESSION: 1. No acute intracranial abnormality. Aspects is 10. 2. Changes of  chronic microvascular ischemic disease involving the supratentorial subcortical white matter. 3. Findings communicated to Dr. Zammit by telephone at 8:35 pm on 02/19/2024. Electronically signed by: Morene Hoard MD 02/19/2024 08:42 PM EDT RP Workstation: [id number]   NCV with EMG(electromyography) Result Date: 01/23/2024 Onita Duos, MD     01/24/2024  9:02 PM     Full Name: [name] Gender: Female MRN #: [id number] Date of Birth: July 14, 1970   Visit Date: 01/23/2024 14:41 Age: 73 Years  Examining Physician: Onita Duos Referring Physician: Onita Duos Height: 5 feet 5 inch History: 53 year old female with slow onset bilateral feet paresthesia, intermittent fingertips paresthesia Summary of the test: Nerve conduction study: Bilateral sural, superficial peroneal sensory responses were absent. Bilateral peroneal to EDB tibial motor responses were absent. Bilateral ulnar sensory and motor responses were within normal limit. Bilateral median sensory and motor responses were also within normal limit. Left median, ulnar transcarpal tunnel response showed no significant difference Electromyography: Selected needle examination of bilateral lower extremity muscles, lumbosacral paraspinal, right abductor hallucis longus, right upper extremity muscle, cervical paraspinal muscle performed. There was increased insertional activity, 2+ spontaneous activity, very small complex motor unit potential noted at the right abductor hallucis longus muscle. Conclusion: This is an abnormal study.  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  There is no evidence of upper extremity focal neuropathy ------------------------------- Duos Onita. M.D. Ph.D. [facility] [address], [address] [place], [place] [address] Tel: [phone] Fax: [phone] Verbal informed consent was obtained from the patient, patient was informed of potential risk of procedure, including bruising, bleeding, hematoma formation, infection, muscle weakness, muscle pain, numbness, among others.     MNC   Nerve / Sites Muscle Latency Ref. Amplitude Ref. Rel Amp Segments Distance Velocity Ref. Area   ms ms mV mV %  cm m/s m/s mVms R Median - APB    Wrist APB 3.1 <=4.4 6.1 >=4.0 100 Wrist - APB 7   14.1    Upper arm APB 6.6  5.9  95.3 Upper arm - Wrist 21 60 >=49 13.9 L Median - APB    Wrist APB 3.0 <=4.4 6.5 >=4.0 100 Wrist - APB 7   21.8    Upper arm APB 6.5  6.1  94 Upper arm - Wrist 24 67 >=49 22.5  R Ulnar - ADM    Wrist ADM 2.4 <=3.3 9.1 >=6.0 100 Wrist - ADM 7   28.1    B.Elbow ADM 4.4  8.7  95.5 B.Elbow - Wrist 12 59 >=49 26.6    A.Elbow ADM 6.9  8.7  100 A.Elbow - B.Elbow 13 52 >=49 26.9 L Ulnar - ADM    Wrist ADM 2.7 <=3.3 7.8 >=6.0 100 Wrist - ADM 7   24.9    B.Elbow ADM 4.5  8.0  103 B.Elbow - Wrist 12 65 >=49 24.5    A.Elbow ADM 6.9  8.3  103 A.Elbow - B.Elbow 13 54 >=49 26.6 R Peroneal - EDB    Ankle EDB NR <=6.5 NR >=2.0 NR Ankle - EDB 9   NR    Fib head EDB      Fib head - Ankle   >=44         Pop fossa - Ankle     L Peroneal - EDB    Ankle EDB NR <=6.5 NR >=2.0 NR Ankle - EDB 9   NR        Pop fossa - Ankle     R Tibial -  AH    Ankle AH NR <=5.8 NR >=4.0 NR Ankle - AH 9   NR    Pop fossa AH      Pop fossa - Ankle   >=41  L Tibial - AH    Ankle AH NR <=5.8 NR >=4.0 NR Ankle - AH 9   NR                   SNC   Nerve / Sites Rec. Site Peak Lat Ref.  Amp Ref. Segments Distance Peak Diff Ref.   ms ms V V  cm ms ms R Sural - Ankle (Calf)    Calf Ankle NR <=4.4 NR >=6 Calf - Ankle 14   L Sural - Ankle (Calf)    Calf Ankle NR <=4.4 NR >=6 Calf - Ankle 14   L Superficial peroneal - Ankle    Lat leg Ankle NR <=4.4 NR >=6 Lat leg - Ankle 14   R Superficial peroneal - Ankle    Lat leg Ankle NR <=4.4 NR >=6 Lat leg - Ankle 14   L Median, Ulnar - Transcarpal comparison    Median Palm Wrist 2.0 <=2.2 37 >=35 Median Palm - Wrist 8      Ulnar Palm Wrist 1.8 <=2.2 9 >=12 Ulnar Palm - Wrist 8         Median Palm - Ulnar Palm  0.2 <=0.4 R Median - Orthodromic (Dig II, Mid palm)    Dig II Wrist 2.9 <=3.4 12 >=10 Dig II - Wrist 13   L Median - Orthodromic (Dig II, Mid palm)    Dig II Wrist 3.0 <=3.4 10 >=10 Dig II - Wrist 13   R Ulnar - Orthodromic, (Dig V, Mid palm)    Dig V Wrist 2.8 <=3.1 5 >=5 Dig V - Wrist 11   L Ulnar - Orthodromic, (Dig V, Mid palm)    Dig V Wrist 2.7 <=3.1 6 >=5 Dig V - Wrist 52                       F  Wave   Nerve F Lat Ref.  ms ms R Ulnar - ADM 25.8 <=32.0 L Ulnar - ADM 25.5 <=32.0        EMG Summary Table   Spontaneous MUAP Recruitment Muscle IA Fib PSW Fasc Other Amp Dur. Poly Pattern R. Tibialis anterior Normal None None None _______ Normal Normal Normal Normal R. Tibialis posterior Normal None None None _______ Normal Normal Normal Normal R. Peroneus longus Normal None None None _______ Normal Normal Normal Normal R. Gastrocnemius (Medial head) Normal None None None _______ Normal Normal Normal Normal R. Vastus lateralis Normal None None None _______ Normal Normal Normal Normal L. Tibialis anterior Normal None None None _______ Normal Normal Normal Normal L. Tibialis posterior Normal None None None _______ Normal Normal Normal Normal L. Peroneus longus Normal None None None _______ Normal Normal Normal Normal L. Gastrocnemius (Medial head) Normal None None None _______ Normal Normal Normal Normal L. Vastus lateralis Normal None None None _______ Normal Normal Normal Normal R. Lumbar paraspinals (low) Normal None None None _______ Normal Normal Normal Normal R. Lumbar paraspinals (mid) Normal None None None _______ Normal Normal Normal Normal L. Lumbar paraspinals (low) Normal None None None _______ Normal Normal Normal Normal L. Lumbar paraspinals (mid) Normal None None None _______ Normal Normal Normal Normal R. First dorsal interosseous Normal None None None _______ Normal Normal Normal Normal R.  Pronator teres Normal None None None _______ Normal Normal Normal Normal R. Biceps brachii Normal None None None _______ Normal Normal Normal Normal R. Deltoid Normal None None None _______ Normal Normal Normal Normal R. Triceps brachii Normal None None None _______ Normal Normal Normal Normal R. Cervical paraspinals Normal None None None _______ Normal Normal Normal Normal R. Abductor hallucis Increased 2+ 2+ None _______ Normal Normal Normal Reduced     Microbiology: Results for orders placed or performed in visit on 09/13/23  Microscopic Examination     Status: Abnormal   Collection Time:  09/14/23  9:00 AM  Result Value Ref Range Status   WBC, UA >30 (A) 0 - 5 /hpf Final   RBC, Urine 3-10 (A) 0 - 2 /hpf Final   Epithelial Cells (non renal) None seen 0 - 10 /hpf Final   Casts None seen None seen /lpf Final   Bacteria, UA Many (A) None seen/Few Final  Urine Culture, Reflex     Status: Abnormal   Collection Time: 09/14/23  9:00 AM  Result Value Ref Range Status   Urine Culture, Routine Final report (A)  Final   Organism ID, Bacteria Klebsiella pneumoniae (A)  Final    Comment: Cefazolin with an MIC <=16 predicts susceptibility to the oral agents cefaclor, cefdinir, cefpodoxime , cefprozil, cefuroxime, cephalexin , and loracarbef when used for therapy of uncomplicated urinary tract infections due to E. coli, Klebsiella pneumoniae, and Proteus mirabilis. Greater than 100,000 colony forming units per mL    ORGANISM ID, BACTERIA Proteus mirabilis (A)  Final    Comment: Cefazolin with an MIC <=16 predicts susceptibility to the oral agents cefaclor, cefdinir, cefpodoxime , cefprozil, cefuroxime, cephalexin , and loracarbef when used for therapy of uncomplicated urinary tract infections due to E. coli, Klebsiella pneumoniae, and Proteus mirabilis. Greater than 100,000 colony forming units per mL    Antimicrobial Susceptibility Comment  Final    Comment:       ** S = Susceptible; I = Intermediate; R = Resistant **                    P = Positive; N = Negative             MICS are expressed in micrograms per mL    Antibiotic                 RSLT#1    RSLT#2    RSLT#3    RSLT#4 Amoxicillin /Clavulanic Acid    S         S Ampicillin                     R         S Cefazolin                      S         S Cefepime                       S         S Cefoxitin                      S         S Cefpodoxime                     S         S Ceftriaxone   S         S Ciprofloxacin                   S         S Ertapenem                      S         S Gentamicin                      S         S Levofloxacin                   S         S Meropenem                      S         S Nitrofurantoin                  I         R Piperacillin /Tazobactam        S         S Tetracycline                   S         R Tobramycin                     S         S Trimethoprim /Sulfa              S         S     Labs: CBC: Recent Labs  Lab 02/19/24 2045 02/20/24 0542 02/21/24 0414  WBC 7.5 8.8 7.7  NEUTROABS 3.8  --   --   HGB 13.3 12.8 12.1  HCT 42.7 40.0 39.3  MCV 88.2 87.0 89.5  PLT 211 231 189   Basic Metabolic Panel: Recent Labs  Lab 02/19/24 2045 02/20/24 0542 02/21/24 0414  NA 141 140 140  K 3.7 3.3* 3.1*  CL 102 105 106  CO2 25 21* 24  GLUCOSE 95 102* 87  BUN 19 15 16   CREATININE 0.75 0.68 0.60  CALCIUM 11.0* 10.6* 9.9  MG  --   --  1.4*   Liver Function Tests: Recent Labs  Lab 02/19/24 2045  AST 22  ALT 12  ALKPHOS 90  BILITOT 0.5  PROT 8.1  ALBUMIN 4.5   CBG: Recent Labs  Lab 02/19/24 1953  GLUCAP 109*    Discharge time spent: 34 minutes.  Length of inpatient stay: 1 days  Signed: Carliss LELON Canales, DO [facility] 02/21/2024

## 2024-02-21 NOTE — Progress Notes (Signed)
 Patient has been provided discharge instructions to include follow up appointments and new medications. Patient verbalizes understanding of discharge instructions.

## 2024-02-22 LAB — URINE CULTURE

## 2024-02-22 NOTE — ED Provider Notes (Signed)
 [facility] MEDICAL SURGICAL UNIT Provider Note   CSN: [id number] Arrival date & time: 02/19/24  1948     Patient presents with: Altered Mental Status   Karen [name] is a 53 y.o. female.   Patient with recent stroke and hypertension and 2 days of confusion with a headache.  The history is provided by the patient and medical records. No language interpreter was used.  Altered Mental Status Presenting symptoms: no behavior changes   Severity:  Mild Most recent episode:  2 days ago Episode history:  Continuous Timing:  Constant Progression:  Waxing and waning Chronicity:  New Context: not alcohol use   Associated symptoms: headaches   Associated symptoms: no abdominal pain, no hallucinations, no rash and no seizures        Prior to Admission medications   Medication Sig Start Date End Date Taking? Authorizing Provider  acetaminophen  (TYLENOL ) 325 MG tablet Take 2 tablets (650 mg total) by mouth every 6 (six) hours as needed for mild pain, fever or headache (or Fever >/= 101). Patient taking differently: Take 325-650 mg by mouth as needed for mild pain (pain score 1-3), fever or headache (or Fever >/= 101). 02/15/20  Yes Emokpae, Courage, MD  cephALEXin  (KEFLEX ) 500 MG capsule Take 1 capsule (500 mg total) by mouth 2 (two) times daily. 02/21/24  Yes Arlon Carliss ORN, DO  NP THYROID  120 MG tablet Take 1 tablet (120 mg total) by mouth daily before breakfast. 12/25/23  Yes Tobie Suzzane POUR, MD  aspirin  81 MG chewable tablet Chew 1 tablet (81 mg total) by mouth daily. 12/27/23     chlorthalidone (HYGROTON) 25 MG tablet Take 1 tablet (25 mg total) by mouth daily. 02/22/24 03/23/24  Arlon Carliss ORN, DO  hydrALAZINE  (APRESOLINE ) 50 MG tablet Take 0.5 tablets (25 mg total) by mouth 3 (three) times daily. 02/21/24 04/21/24  Arlon Carliss ORN, DO  macitentan  (OPSUMIT ) 10 MG tablet Take 1 tablet (10 mg total) by mouth daily. 08/09/23   Bensimhon, Toribio SAUNDERS, MD  ondansetron  (ZOFRAN ) 4 MG  tablet Take 1 tablet (4 mg total) by mouth every 6 (six) hours as needed for nausea. 02/21/24   Arlon Carliss ORN, DO  traZODone  (DESYREL ) 50 MG tablet Take 1/2-1 tablets (25-50 mg total) by mouth at bedtime as needed for sleep. 12/28/23   Tobie Suzzane POUR, MD  PARoxetine  (PAXIL ) 10 MG tablet Take 1 tablet (10 mg total) by mouth daily as directed 10/07/20 12/02/20      Allergies: Tocilizumab  and Lisinopril    Review of Systems  Constitutional:  Negative for appetite change and fatigue.  HENT:  Negative for congestion, ear discharge and sinus pressure.   Eyes:  Negative for discharge.  Respiratory:  Negative for cough.   Cardiovascular:  Negative for chest pain.  Gastrointestinal:  Negative for abdominal pain and diarrhea.  Genitourinary:  Negative for frequency and hematuria.  Musculoskeletal:  Negative for back pain.  Skin:  Negative for rash.  Neurological:  Positive for headaches. Negative for seizures.  Psychiatric/Behavioral:  Negative for hallucinations.     Updated Vital Signs BP (!) 157/98 (BP Location: Right Arm)   Pulse 98   Temp 98.3 F (36.8 C) (Oral)   Resp 16   Ht 5' 4 (1.626 m)   Wt 57.2 kg   SpO2 100%   BMI 21.63 kg/m   Physical Exam Vitals and nursing note reviewed.  Constitutional:      Appearance: She is well-developed.  HENT:  Head: Normocephalic.     Nose: Nose normal.  Eyes:     General: No scleral icterus.    Conjunctiva/sclera: Conjunctivae normal.  Neck:     Thyroid : No thyromegaly.  Cardiovascular:     Rate and Rhythm: Normal rate and regular rhythm.     Heart sounds: No murmur heard.    No friction rub. No gallop.  Pulmonary:     Breath sounds: No stridor. No wheezing or rales.  Chest:     Chest wall: No tenderness.  Abdominal:     General: There is no distension.     Tenderness: There is no abdominal tenderness. There is no rebound.  Musculoskeletal:        General: Normal range of motion.     Cervical back: Neck supple.   Lymphadenopathy:     Cervical: No cervical adenopathy.  Skin:    Findings: No erythema or rash.  Neurological:     Mental Status: She is alert.     Motor: No abnormal muscle tone.     Coordination: Coordination normal.     Comments: Oriented to person and place  Psychiatric:        Behavior: Behavior normal.     (all labs ordered are listed, but only abnormal results are displayed) Labs Reviewed  URINE CULTURE - Abnormal; Notable for the following components:      Result Value   Culture MULTIPLE SPECIES PRESENT, SUGGEST RECOLLECTION (*)    All other components within normal limits  COMPREHENSIVE METABOLIC PANEL WITH GFR - Abnormal; Notable for the following components:   Calcium 11.0 (*)    All other components within normal limits  BASIC METABOLIC PANEL WITH GFR - Abnormal; Notable for the following components:   Potassium 3.3 (*)    CO2 21 (*)    Glucose, Bld 102 (*)    Calcium 10.6 (*)    All other components within normal limits  BASIC METABOLIC PANEL WITH GFR - Abnormal; Notable for the following components:   Potassium 3.1 (*)    All other components within normal limits  MAGNESIUM  - Abnormal; Notable for the following components:   Magnesium  1.4 (*)    All other components within normal limits  URINALYSIS, W/ REFLEX TO CULTURE (INFECTION SUSPECTED) - Abnormal; Notable for the following components:   Protein, ur 100 (*)    Leukocytes,Ua MODERATE (*)    All other components within normal limits  CBG MONITORING, ED - Abnormal; Notable for the following components:   Glucose-Capillary 109 (*)    All other components within normal limits  PROTIME-INR  APTT  CBC  DIFFERENTIAL  ETHANOL  URINE DRUG SCREEN  CBC  AMMONIA  TSH  RPR  VITAMIN B12  CBC    EKG: EKG Interpretation Date/Time:  Tuesday February 19 2024 20:42:28 EDT Ventricular Rate:  91 PR Interval:  170 QRS Duration:  78 QT Interval:  375 QTC Calculation: 462 R Axis:   -14  Text  Interpretation: Sinus rhythm Consider anterior infarct Confirmed by Bari Flank ([id number]) on 02/20/2024 9:35:59 AM  Radiology: No results found.   Procedures   Medications Ordered in the ED  labetalol  (NORMODYNE ) injection 20 mg (20 mg Intravenous Given 02/19/24 2046)  metoCLOPramide  (REGLAN ) injection 10 mg (10 mg Intravenous Given 02/19/24 2058)  diphenhydrAMINE  (BENADRYL ) injection 25 mg (25 mg Intravenous Given 02/19/24 2059)  ketorolac  (TORADOL ) 30 MG/ML injection 30 mg (30 mg Intravenous Given 02/19/24 2056)  labetalol  (NORMODYNE ) injection 20 mg (20 mg Intravenous Given  02/19/24 2245)   CRITICAL CARE Performed by: Fairy Sermon Total critical care time: 45 minutes Critical care time was exclusive of separately billable procedures and treating other patients. Critical care was necessary to treat or prevent imminent or life-threatening deterioration. Critical care was time spent personally by me on the following activities: development of treatment plan with patient and/or surrogate as well as nursing, discussions with consultants, evaluation of patient's response to treatment, examination of patient, obtaining history from patient or surrogate, ordering and performing treatments and interventions, ordering and review of laboratory studies, ordering and review of radiographic studies, pulse oximetry and re-evaluation of patient's condition.   Code stroke called for confusion.  Patient was seen by neurology and they thought she had encephalopathy possibly secondary to migraine headache.  Patient also has hypertension urgency.  Patient is admitted to medicine                               Medical Decision Making Amount and/or Complexity of Data Reviewed Labs: ordered. Radiology: ordered.  Risk Prescription drug management. Decision regarding hospitalization.   Encephalopathy, migraine headache, hypertensive urgency.  Patient will be admitted to medicine     Final diagnoses:   Confusion  Migraine without aura and with status migrainosus, not intractable  Hypertensive urgency    ED Discharge Orders          Ordered    chlorthalidone (HYGROTON) 25 MG tablet  Daily,   Status:  Discontinued        02/21/24 1117    chlorthalidone (HYGROTON) 25 MG tablet  Daily        02/21/24 1205    hydrALAZINE  (APRESOLINE ) 50 MG tablet  3 times daily,   Status:  Discontinued        02/21/24 1117    ondansetron  (ZOFRAN ) 4 MG tablet  Every 6 hours PRN,   Status:  Discontinued        02/21/24 1117    sulfamethoxazole -trimethoprim  (BACTRIM  DS) 800-160 MG tablet  2 times daily,   Status:  Discontinued        02/21/24 1117    Increase activity slowly        02/21/24 1117    Diet - low sodium heart healthy        02/21/24 1117    Call MD for:  temperature >100.4        02/21/24 1117    Call MD for:  persistant nausea and vomiting        02/21/24 1117    Call MD for:  difficulty breathing, headache or visual disturbances        02/21/24 1117    Call MD for:  persistant dizziness or light-headedness        02/21/24 1117    Call MD for:  extreme fatigue        02/21/24 1117    cephALEXin  (KEFLEX ) 500 MG capsule  2 times daily        02/21/24 1205    hydrALAZINE  (APRESOLINE ) 50 MG tablet  3 times daily        02/21/24 1205    ondansetron  (ZOFRAN ) 4 MG tablet  Every 6 hours PRN        02/21/24 1205               Sermon Fairy, MD 02/22/24 1423

## 2024-02-26 ENCOUNTER — Encounter (HOSPITAL_COMMUNITY): Payer: Self-pay

## 2024-02-26 ENCOUNTER — Ambulatory Visit (HOSPITAL_COMMUNITY)

## 2024-02-26 ENCOUNTER — Ambulatory Visit (HOSPITAL_COMMUNITY)
Admission: RE | Admit: 2024-02-26 | Discharge: 2024-02-26 | Disposition: A | Discharge status: Home / Self Care | Source: Ambulatory Visit | Attending: Cardiology | Admitting: Cardiology

## 2024-02-26 VITALS — BP 118/74 | Ht 64.0 in | Wt 117.0 lb

## 2024-02-26 DIAGNOSIS — I10 Essential (primary) hypertension: Secondary | ICD-10-CM | POA: Diagnosis not present

## 2024-02-26 DIAGNOSIS — I110 Hypertensive heart disease with heart failure: Secondary | ICD-10-CM | POA: Insufficient documentation

## 2024-02-26 DIAGNOSIS — J8410 Pulmonary fibrosis, unspecified: Secondary | ICD-10-CM | POA: Diagnosis not present

## 2024-02-26 DIAGNOSIS — M349 Systemic sclerosis, unspecified: Secondary | ICD-10-CM | POA: Diagnosis not present

## 2024-02-26 DIAGNOSIS — I5032 Chronic diastolic (congestive) heart failure: Secondary | ICD-10-CM | POA: Diagnosis not present

## 2024-02-26 DIAGNOSIS — Z87891 Personal history of nicotine dependence: Secondary | ICD-10-CM | POA: Diagnosis not present

## 2024-02-26 DIAGNOSIS — Z8673 Personal history of transient ischemic attack (TIA), and cerebral infarction without residual deficits: Secondary | ICD-10-CM | POA: Insufficient documentation

## 2024-02-26 DIAGNOSIS — I272 Pulmonary hypertension, unspecified: Secondary | ICD-10-CM | POA: Diagnosis not present

## 2024-02-26 DIAGNOSIS — E039 Hypothyroidism, unspecified: Secondary | ICD-10-CM | POA: Diagnosis not present

## 2024-02-26 DIAGNOSIS — R000 Tachycardia, unspecified: Secondary | ICD-10-CM | POA: Insufficient documentation

## 2024-02-26 DIAGNOSIS — I2510 Atherosclerotic heart disease of native coronary artery without angina pectoris: Secondary | ICD-10-CM | POA: Diagnosis not present

## 2024-02-26 DIAGNOSIS — Z79899 Other long term (current) drug therapy: Secondary | ICD-10-CM | POA: Diagnosis not present

## 2024-02-26 DIAGNOSIS — M3500 Sicca syndrome, unspecified: Secondary | ICD-10-CM | POA: Diagnosis not present

## 2024-02-26 DIAGNOSIS — Z8249 Family history of ischemic heart disease and other diseases of the circulatory system: Secondary | ICD-10-CM | POA: Diagnosis not present

## 2024-02-26 NOTE — Progress Notes (Signed)
 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 Referred by Dr. Jon Jacob for evaluation of pulmonary HTN in the setting of scleroderma.   RHC in 5/19 with minimally elevated pressures and hi-res CT which showed ILD. Follows with Dr. Theophilus . F/u CT in 2/20 showed stable IL  Started macitentan  in May 2021.   Echo 3/23 EF 60-65% RV ok   Repeat cath 4/23 for worsening CP/SOB: 1. Very mild non-obstructive CAD (LAD 20%, OM-1 20%). LVEF 60-65%. RA 3, PA 36/14 (23), PCW 3, CO/CI (fick) 7.3/4.4   She is on the [facility] List.   Admitted to [facility] 8/25 for TIA while driving. Negative head.neck MRI and CTA for CVA. She re-presented to [facility] with acute metabolic encephalopathy and hypertensive emergency SBP >200. Confusion improved with BP control. She was discharged on hydralazine  50 and chlorthalidone 25 mg daily.   She returns today for pulmonary hypertension follow up with her sister. Overall feeling meh. NYHA III, limited primarily by pulmonary disease. She was recently stopped on her ILD medications last hospitalization. Headaches have improved with starting hydralazine . Reports dyspnea, fatigue, and palpitations. Denies near-syncope, orthopnea, and dizziness. Able to perform ADLs. Appetite okay. BP at home 120s/80s. Compliant with all medications.  Cardiac Studies:   - Echo 3/23: EF 70-75%, mod LVH, nl RV - Echo 10/21 EF 65-70% RV ok - Echo 7/20: EF hyperdynamic 65-70% RV normal  - Echo 5/19: EF 60-65% grade I DD RV normal. Mild TR.  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  Aortic atherosclerosis, three-vessel CAD  - PFT 3/19: FVC 1.7 L, 56%   FEV-1 1.5 1L, 62%   DLCO 44% - PFT 3/19: FVC 1.65 (54%), FEV1 1.53 (62%], F/F 93, TLC 50, DLCO 44%, DLCO/VA 131% - PFT 6/19: FVC 1.51 [50%], FEV1 1.26 [51%], F/F 83 - PFT 9/19: FVC 1.69 [5%), FEV1 1.50 [61%], F/F 89, DLCO 10.57 [41%] - PFT 8/20: FVC 1.58 [52%], FEV1 1.46 [60%], F/F 92, TLC 2.62 [50%], DLCO 9.16 [42%] - PFT 9/22: FVC 1.46 L, 49%  FEV-1 1.34 1L, 56%  DLCO 43%    - 5/25 ([facility]): 1200, sat 94% - 5/25: 368m, post walk HR, sats 109, 94% - 9/20: 163m, Post walk heart rate, sats 83, 98% - 1/20: 212 m, Post walk heart rate, sats 86, 92% - 9/19: 249 m, Post walk heart rate, sats 101, 99% - 6/19: 144 m, Post walk heart rate, stats 94, 91%  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  Cerebral vascular disease 01/23/2024   Cervical radicular pain 08/08/2023   CHF (congestive heart failure) (HCC)    Chronic diastolic CHF (congestive heart failure) (HCC) 10/11/2022   Chronic GERD 12/18/2017   Chronic nonintractable headache 09/08/2021   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   Delayed gastric emptying 11/10/2021   Dermatofibroma 03/25/2020   Gastroparesis    GERD (gastroesophageal reflux disease)    History of TIA (transient ischemic attack) 12/28/2023   Hot flashes due to menopause 02/27/2019   HTN (hypertension)  03/23/2017   Hypothyroidism    Hypothyroidism, adult 08/04/2021   ILD (interstitial lung disease) (HCC) 02/08/2018   Left hand paresthesia 01/23/2024   Multinodular goiter 08/30/2017   Multinodular thyroid     benign FNA 08/2017.   Perimenopause 02/27/2019   Peripheral polyneuropathy 01/23/2024   Primary osteoarthritis 04/07/2021   Pulmonary edema    Pulmonary hypertension (HCC) 08/04/2021   Raynaud's disease 11/10/2021   Scleroderma (HCC)    Sjogren's syndrome 08/30/2017   Status post laparoscopic cholecystectomy 02/13/20 02/13/2020   Thyroid  nodule 08/04/2021   Vitamin D  deficiency disease 02/27/2019    Current Outpatient Medications  Medication Sig Dispense Refill   acetaminophen  (TYLENOL ) 325 MG tablet Take 2 tablets (650 mg total) by mouth every 6 (six) hours as needed for mild pain, fever or headache (or Fever >/= 101). (Patient taking differently: Take 325-650 mg by mouth as needed for mild pain (pain score 1-3), fever or headache (or Fever >/= 101).) 12 tablet 0   aspirin  81 MG chewable tablet Chew 1 tablet (81 mg total) by mouth daily. 30 tablet 0   chlorthalidone (HYGROTON) 25 MG tablet Take 1 tablet (25 mg total) by mouth daily. 30 tablet 1   hydrALAZINE  (APRESOLINE ) 50 MG tablet Take 0.5 tablets (25 mg total) by mouth 3 (three) times daily. 90 tablet 1   macitentan  (OPSUMIT ) 10 MG tablet Take 1 tablet (10 mg total) by mouth daily. 30 tablet 11   NP THYROID  120 MG tablet Take 1 tablet (120 mg total) by mouth daily before breakfast. 90 tablet 0   ondansetron  (ZOFRAN ) 4 MG tablet Take 1 tablet (4 mg total) by mouth every 6 (six) hours as needed for nausea. 15 tablet 0   traZODone  (DESYREL ) 50 MG tablet Take 1/2-1 tablets (25-50 mg total) by mouth at bedtime as needed for sleep. 30 tablet 3   cephALEXin  (KEFLEX ) 500 MG capsule Take 1 capsule (500 mg total) by mouth 2 (two) times daily. (Patient not taking: Reported on 02/26/2024) 10 capsule 0   No current facility-administered  medications for this encounter.    Allergies  Allergen Reactions   Tocilizumab  Shortness Of Breath, Swelling and Other (See Comments)    Swelling and shortness of breath sharpe pains in back and chest tightness. 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 She lives alone in one level home, a few steps to enter.CMA [employer].   Social Drivers of Corporate investment banker Strain: Low Risk  (12/25/2023)   Received from [facility]   Overall Financial Resource Strain (CARDIA)    How hard is it for you to pay for the very basics like food, housing, medical care, and heating?: Not very hard  Food Insecurity: No Food Insecurity (02/20/2024)   Hunger Vital Sign    Worried About Running Out of Food in the Last Year: Never true    Ran Out of Food in the Last Year: Never true  Transportation Needs: No Transportation Needs (02/20/2024)   PRAPARE - Administrator, Civil Service (Medical): No    Lack of Transportation (Non-Medical): No  Physical Activity: Inactive (12/25/2023)   Received from [facility]   Exercise Vital Sign    On average, how many days per week do you engage in moderate to strenuous exercise (like a brisk walk)?: 0 days    On average, how many minutes do you engage in exercise at this level?: 0 min   Stress: No Stress Concern Present (12/25/2023)   Received from [facility] - Occupational Stress Questionnaire    Do you feel stress - tense, restless, nervous, or anxious, or unable to sleep at night because your mind is troubled all the time - these days?: Only a little  Social Connections: Socially Integrated (12/25/2023)   Received from [facility]   Social Connection and Isolation Panel    In a typical week, how many times do you talk on the phone with family, friends, or neighbors?: More than three times a week    How often do you get together with friends or relatives?: More than three times a week    How often do you attend church or religious services?: More than 4 times per year    Do you belong to any clubs or organizations such as church groups, unions, fraternal or athletic groups, or school groups?: Yes    How often do you attend meetings of the clubs or organizations you belong to?: 1 to 4 times per year    Are you married, widowed, divorced, separated, never married, or living with a partner?: Living with partner  Intimate Partner Violence: Not At Risk (02/20/2024)   Humiliation, Afraid, Rape, and Kick questionnaire    Fear of Current or Ex-Partner: No    Emotionally Abused: No    Physically Abused: No    Sexually Abused: No      Family History  Problem Relation Age of Onset   Hypertension Father    Hypertension Sister    Hypertension Brother    Diabetes Maternal Grandfather    Diabetes Maternal Uncle    Diabetes Mother    Colon cancer Neg Hx    Blood pressure 118/74, height 5' 4 (1.626 m), weight 53.1 kg (117 lb).  Wt Readings from Last 3 Encounters:  02/26/24 53.1 kg (117 lb)  02/20/24 57.2 kg (126 lb)  01/23/24 58.1 kg (128 lb)   PHYSICAL EXAM: General: Well appearing. No distress on RA Cardiac: JVP ~7cm. S1 and S2 present. No murmurs. Resp: Lung bases diminished Extremities: Warm and dry.  No peripheral  edema.  Neuro: Alert and oriented x3. Affect pleasant. Moves all extremities without difficulty.  ECG (personally  reviewed): ST 117 bpm  ReDs reading: 25 %, normal  ASSESSMENT & PLAN:  1. Chronic HFpEF - Echo  3/23 EF 60-65% G1DD RV ok  - Volume status much improved. Now likely dry - NYHA III - likely due primarily from scleroderma and ILD - repeat echo scheduled   2. PAH - WHO group I and III - RHC 4/23: very mild PAH w nor CO. [RA 3 PA 6/14 (23) PCW 3 Fick 7.3/4.4 PVR 2.7] - continue macitentan  - repeat echo  3. HTN - No evidence of scleroderma renal crisis - has had 2 recent admission with hypertensive crisis and confusion - controlled on hydralazine  25 mg tid + chlorathalidone 25 mg daily; continue   4. Scleroderma - Followed by Dr Ishmael - currently off her cellcept  - No change   5. Pulmonary fibrosis - High res CT chest c/w with ILD. Stable 2/21 and 1/22 and 3/24 - Followed by Dr Theophilus, message to get back in as she is off her Ofev  & Cellcept  and wants to restart - Following in [facility]; on transplant list - Suspect this is major driver of her symptoms.   Follow up in 2 months with Dr. Bensimhon  [place] Ritu Gagliardo, NP  02/26/2024

## 2024-02-26 NOTE — Patient Instructions (Signed)
 Medication Changes:  None, continue current medications  Testing/Procedures:  Your physician has requested that you have an echocardiogram. Echocardiography is a painless test that uses sound waves to create images of your heart. It provides your doctor with information about the size and shape of your heart and how well your heart's chambers and valves are working. This procedure takes approximately one hour. There are no restrictions for this procedure. Please do NOT wear cologne, perfume, aftershave, or lotions (deodorant is allowed). Please arrive 15 minutes prior to your appointment time. AS SCHEDULED 03/27/24  Please note: We ask at that you not bring children with you during ultrasound (echo/ vascular) testing. Due to room size and safety concerns, children are not allowed in the ultrasound rooms during exams. Our front office staff cannot provide observation of children in our lobby area while testing is being conducted. An adult accompanying a patient to their appointment will only be allowed in the ultrasound room at the discretion of the ultrasound technician under special circumstances. We apologize for any inconvenience.  Special Instructions // Education:  Do the following things EVERYDAY: Weigh yourself in the morning before breakfast. Write it down and keep it in a log. Take your medicines as prescribed Eat low salt foods--Limit salt (sodium) to 2000 mg per day.  Stay as active as you can everyday Limit all fluids for the day to less than 2 liters   Follow-Up in: 2 months   At the [facility], you and your health needs are our priority. We have a designated team specialized in the treatment of Heart Failure. This Care Team includes your primary Heart Failure Specialized Cardiologist (physician), Advanced Practice Providers (APPs- Physician Assistants and Nurse Practitioners), and Pharmacist who all work together to provide you with the care you need, when  you need it.   You may see any of the following providers on your designated Care Team at your next follow up:  Dr. Toribio Fuel Dr. Ezra Shuck Dr. Ria Commander Dr. Odis Brownie Greig Mosses, NP Caffie Shed, [place] [facility] [place], [place] Beckey Coe, NP [place] Lee, NP Tinnie Redman, PharmD   Please be sure to bring in all your medications bottles to every appointment.   Need to Contact Us :  If you have any questions or concerns before your next appointment please send us  a message through [place] or call our office at [phone].    TO LEAVE A MESSAGE FOR THE NURSE SELECT OPTION 2, PLEASE LEAVE A MESSAGE INCLUDING: YOUR NAME DATE OF BIRTH CALL BACK NUMBER REASON FOR CALL**this is important as we prioritize the call backs  YOU WILL RECEIVE A CALL BACK THE SAME DAY AS LONG AS YOU CALL BEFORE 4:00 PM

## 2024-02-26 NOTE — Progress Notes (Signed)
 ReDS Vest / Clip - 02/26/24 1052       ReDS Vest / Clip   Station Marker A    Ruler Value 25    ReDS Value Range Low volume    ReDS Actual Value 25

## 2024-02-28 ENCOUNTER — Ambulatory Visit: Admitting: Internal Medicine

## 2024-02-28 ENCOUNTER — Ambulatory Visit (HOSPITAL_COMMUNITY): Admitting: Physical Therapy

## 2024-02-28 ENCOUNTER — Other Ambulatory Visit (HOSPITAL_BASED_OUTPATIENT_CLINIC_OR_DEPARTMENT_OTHER): Payer: Self-pay

## 2024-02-28 ENCOUNTER — Telehealth: Payer: Self-pay | Admitting: Neurology

## 2024-02-28 ENCOUNTER — Encounter: Payer: Self-pay | Admitting: Internal Medicine

## 2024-02-28 VITALS — BP 152/84 | HR 108 | Ht 64.0 in | Wt 121.4 lb

## 2024-02-28 DIAGNOSIS — E039 Hypothyroidism, unspecified: Secondary | ICD-10-CM | POA: Diagnosis not present

## 2024-02-28 DIAGNOSIS — M6281 Muscle weakness (generalized): Secondary | ICD-10-CM | POA: Diagnosis present

## 2024-02-28 DIAGNOSIS — Z7409 Other reduced mobility: Secondary | ICD-10-CM

## 2024-02-28 DIAGNOSIS — J849 Interstitial pulmonary disease, unspecified: Secondary | ICD-10-CM

## 2024-02-28 DIAGNOSIS — R2689 Other abnormalities of gait and mobility: Secondary | ICD-10-CM

## 2024-02-28 DIAGNOSIS — G9341 Metabolic encephalopathy: Secondary | ICD-10-CM

## 2024-02-28 DIAGNOSIS — Z09 Encounter for follow-up examination after completed treatment for conditions other than malignant neoplasm: Secondary | ICD-10-CM | POA: Diagnosis not present

## 2024-02-28 DIAGNOSIS — I10 Essential (primary) hypertension: Secondary | ICD-10-CM | POA: Diagnosis not present

## 2024-02-28 MED ORDER — HYDRALAZINE HCL 50 MG PO TABS
50.0000 mg | ORAL_TABLET | Freq: Three times a day (TID) | ORAL | 3 refills | Status: DC
  Filled 2024-02-28: qty 90, 30d supply, fill #0
  Filled 2024-06-05: qty 270, 90d supply, fill #0
  Filled 2024-06-05: qty 90, 30d supply, fill #0

## 2024-02-28 MED ORDER — OLMESARTAN MEDOXOMIL 20 MG PO TABS
20.0000 mg | ORAL_TABLET | Freq: Every day | ORAL | 3 refills | Status: DC
  Filled 2024-02-28: qty 30, 30d supply, fill #0
  Filled 2024-06-05: qty 90, 90d supply, fill #0
  Filled 2024-06-05: qty 30, 30d supply, fill #1

## 2024-02-28 MED ORDER — BISOPROLOL FUMARATE 5 MG PO TABS
5.0000 mg | ORAL_TABLET | Freq: Every day | ORAL | 1 refills | Status: AC
  Filled 2024-02-28: qty 90, 90d supply, fill #0
  Filled 2024-06-05: qty 90, 90d supply, fill #1
  Filled 2024-06-05: qty 90, 90d supply, fill #0

## 2024-02-28 NOTE — Assessment & Plan Note (Signed)
 Lab Results  Component Value Date   TSH 3.550 02/19/2024   On NP thyroid  120 mg QD -needs to take it in a.m. on empty stomach Recently checked TSH and free T4 as she has chronic fatigue

## 2024-02-28 NOTE — Assessment & Plan Note (Signed)
Hospital chart reviewed including discharge summary Medications reconciled and reviewed with the patient in detail

## 2024-02-28 NOTE — Patient Instructions (Signed)
 Please start taking Hydralazine  50 mg 3 times daily, Zebeta  5 mg once daily, Olmesartan  20 mg once daily and Chlorthalidone 25 mg once daily.  Please continue to follow low salt diet.  Please start taking magnesium  oxide 400 mg once daily.
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
## 2024-02-28 NOTE — Telephone Encounter (Signed)
 9/22 & 9/18 LVM for her to call me back to schedule her MRI and I haven't heard from her.

## 2024-02-28 NOTE — Progress Notes (Signed)
 Established Patient Office Visit  Subjective:  Patient ID: Karen [name], female    DOB: [birth year]  Age: 53 y.o. MRN: [id number]  CC:  Chief Complaint  Patient presents with   Medical Management of Chronic Issues    Follow up, reports feeling fatigued and weak.     HPI Karen [name] is a 53 y.o. female with past medical history of HTN, scleroderma, ILD, pulmonary hypertension, dysphagia, hypothyroidism, chronic constipation and hot flashes who presents for follow-up after recent hospitalization.  She presented with confusion, agitation, and headache. Found to have sbp > 200.  She was admitted for evaluation for acute metabolic encephalopathy due to hypertensive emergency.  CT head was negative.  Her encephalopathy resolved with improvement in BP with IV hydralazine .  She was later switched to oral hydralazine  and chlorthalidone.  Of note, her olmesartan  and Zebeta  have been discontinued.  She is also concerned as her ILD medications were also discontinued from recent hospitalization.  She still reports feeling fatigued, lack of appetite and reports myalgias.  She takes NP thyroid  120 mg QD.  She has not been able to return due to persistent fatigue.  Her BP was elevated today.  She also has tachycardia since stopping Zebeta .    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   Cerebral vascular disease 01/23/2024   Cervical radicular pain 08/08/2023   CHF (congestive heart failure) (HCC)    Chronic diastolic CHF (congestive heart failure) (HCC) 10/11/2022   Chronic GERD 12/18/2017   Chronic nonintractable headache 09/08/2021   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    Delayed gastric emptying 11/10/2021   Dermatofibroma 03/25/2020   Gastroparesis    GERD (gastroesophageal reflux disease)    History of TIA (transient ischemic attack) 12/28/2023   Hot flashes due to menopause 02/27/2019   HTN (hypertension) 03/23/2017   Hypothyroidism    Hypothyroidism, adult 08/04/2021   ILD (interstitial lung disease) (HCC) 02/08/2018   Left hand paresthesia 01/23/2024   Multinodular goiter 08/30/2017   Multinodular thyroid     benign FNA 08/2017.   Perimenopause 02/27/2019   Peripheral polyneuropathy 01/23/2024   Primary osteoarthritis 04/07/2021   Pulmonary edema    Pulmonary hypertension (HCC) 08/04/2021   Raynaud's disease 11/10/2021   Scleroderma (HCC)    Sjogren's syndrome 08/30/2017   Status post laparoscopic cholecystectomy 02/13/20 02/13/2020   Thyroid  nodule 08/04/2021   Vitamin D  deficiency disease 02/27/2019    Past Surgical History:  Procedure Laterality Date   ABDOMINAL HERNIA REPAIR     BIOPSY OF SKIN SUBCUTANEOUS TISSUE AND/OR MUCOUS MEMBRANE  08/02/2023   Procedure: BIOPSY, SKIN, SUBCUTANEOUS TISSUE, OR MUCOUS MEMBRANE;  Surgeon: Leigh Elspeth SQUIBB, MD;  Location: [facility];  Service: Gastroenterology;;   ROMAYNE Bilateral 10/2018   CHOLECYSTECTOMY N/A 02/13/2020   Procedure: LAPAROSCOPIC CHOLECYSTECTOMY;  Surgeon: Mavis Anes, MD;  Location: [facility];  Service: General;  Laterality: N/A;   COLONOSCOPY WITH PROPOFOL  N/A 01/02/2019   Procedure: COLONOSCOPY WITH PROPOFOL ;  Surgeon: Shaaron Lamar HERO, MD;  Location: [facility];  Service: Endoscopy;  Laterality: N/A;  12:30pm   ESOPHAGOGASTRODUODENOSCOPY (EGD) WITH PROPOFOL  N/A 01/28/2018   erosive reflux esophagitis, patulous EG Junction, no dilation, incomplete EGD due to retained food in stomach. GES thereafter with delayed  gastric emptying.    ESOPHAGOGASTRODUODENOSCOPY (EGD) WITH PROPOFOL  N/A 08/02/2023   Procedure: ESOPHAGOGASTRODUODENOSCOPY (EGD) WITH PROPOFOL ;  Surgeon: Leigh Elspeth SQUIBB, MD;  Location: [facility];  Service: Gastroenterology;  Laterality: N/A;   RIGHT HEART CATH N/A 09/27/2017   Procedure: RIGHT HEART CATH;  Surgeon: Cherrie Toribio SAUNDERS, MD;  Location: [facility];  Service: Cardiovascular;  Laterality: N/A;   RIGHT HEART CATH N/A 09/05/2019   Procedure: RIGHT HEART CATH;  Surgeon: Cherrie Toribio SAUNDERS, MD;  Location: [facility];  Service: Cardiovascular;  Laterality: N/A;   RIGHT/LEFT HEART CATH AND CORONARY ANGIOGRAPHY N/A 08/18/2021   Procedure: RIGHT/LEFT HEART CATH AND CORONARY ANGIOGRAPHY;  Surgeon: Cherrie Toribio SAUNDERS, MD;  Location: [facility];  Service: Cardiovascular;  Laterality: N/A;   UTERINE FIBROID SURGERY      Family History  Problem Relation Age of Onset   Hypertension Father    Hypertension Sister    Hypertension Brother    Diabetes Maternal Grandfather    Diabetes Maternal Uncle    Diabetes Mother    Colon cancer Neg Hx     Social History   Socioeconomic History   Marital status: Single    Spouse name: Not on file   Number of children: 0   Years of education: Not on file   Highest education level: Associate degree: occupational, Scientist, product/process development, or vocational program  Occupational History   Occupation: CMA    Employer: 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 She lives alone in one level home, a few steps to enter.CMA [employer].   Social Drivers of Corporate investment banker Strain: Low Risk  (12/25/2023)   Received from [facility]   Overall Financial Resource Strain (CARDIA)    How hard is it for you to pay for the very basics like food, housing, medical care, and heating?: Not very hard  Food  Insecurity: No Food Insecurity (02/20/2024)   Hunger Vital Sign    Worried About Running Out of Food in the Last Year: Never true    Ran Out of Food in the Last Year: Never true  Transportation Needs: No Transportation Needs (02/20/2024)   PRAPARE - Administrator, Civil Service (Medical): No    Lack of Transportation (Non-Medical): No  Physical Activity: Inactive (12/25/2023)   Received from [facility]   Exercise Vital Sign    On average, how many days per week do you engage in moderate to strenuous exercise (like a brisk walk)?: 0 days    On average, how many minutes do you engage in exercise at this level?: 0 min  Stress: No Stress Concern Present (12/25/2023)   Received from [facility] - Occupational Stress Questionnaire    Do you feel stress - tense, restless, nervous, or anxious, or unable to sleep at night because your mind is troubled all the time - these days?: Only a little  Social Connections: Socially Integrated (12/25/2023)   Received from [facility]   Social Connection and Isolation Panel    In a typical week, how many times  do you talk on the phone with family, friends, or neighbors?: More than three times a week    How often do you get together with friends or relatives?: More than three times a week    How often do you attend church or religious services?: More than 4 times per year    Do you belong to any clubs or organizations such as church groups, unions, fraternal or athletic groups, or school groups?: Yes    How often do you attend meetings of the clubs or organizations you belong to?: 1 to 4 times per year    Are you married, widowed, divorced, separated, never married, or living with a partner?: Living with partner  Intimate Partner Violence: Not At Risk (02/20/2024)   Humiliation, Afraid, Rape, and Kick questionnaire    Fear of Current or Ex-Partner: No    Emotionally Abused: No    Physically  Abused: No    Sexually Abused: No    Outpatient Medications Prior to Visit  Medication Sig Dispense Refill   acetaminophen  (TYLENOL ) 325 MG tablet Take 2 tablets (650 mg total) by mouth every 6 (six) hours as needed for mild pain, fever or headache (or Fever >/= 101). (Patient taking differently: Take 325-650 mg by mouth as needed for mild pain (pain score 1-3), fever or headache (or Fever >/= 101).) 12 tablet 0   aspirin  81 MG chewable tablet Chew 1 tablet (81 mg total) by mouth daily. 30 tablet 0   cephALEXin  (KEFLEX ) 500 MG capsule Take 1 capsule (500 mg total) by mouth 2 (two) times daily. 10 capsule 0   chlorthalidone (HYGROTON) 25 MG tablet Take 1 tablet (25 mg total) by mouth daily. 30 tablet 1   macitentan  (OPSUMIT ) 10 MG tablet Take 1 tablet (10 mg total) by mouth daily. 30 tablet 11   mycophenolate  (CELLCEPT ) 500 MG tablet Take 1,000 mg by mouth 2 (two) times daily.     Nintedanib  (OFEV ) 150 MG CAPS Take 150 mg by mouth 2 (two) times daily.     NP THYROID  120 MG tablet Take 1 tablet (120 mg total) by mouth daily before breakfast. 90 tablet 0   ondansetron  (ZOFRAN ) 4 MG tablet Take 1 tablet (4 mg total) by mouth every 6 (six) hours as needed for nausea. 15 tablet 0   traZODone  (DESYREL ) 50 MG tablet Take 1/2-1 tablets (25-50 mg total) by mouth at bedtime as needed for sleep. 30 tablet 3   hydrALAZINE  (APRESOLINE ) 50 MG tablet Take 0.5 tablets (25 mg total) by mouth 3 (three) times daily. 90 tablet 1   No facility-administered medications prior to visit.    Allergies  Allergen Reactions   Tocilizumab  Shortness Of Breath, Swelling and Other (See Comments)    Swelling and shortness of breath sharpe pains in back and chest tightness. Vancomycin Infusion Reaction   Lisinopril Hives and Swelling    ROS Review of Systems  Constitutional:  Positive for appetite change, fatigue and unexpected weight change. Negative for chills and fever.  HENT:  Negative for congestion, sinus  pressure, sinus pain and sore throat.   Eyes:  Negative for pain and discharge.  Respiratory:  Positive for cough and shortness of breath (intermittent).   Cardiovascular:  Negative for chest pain and palpitations.  Gastrointestinal:  Positive for constipation. Negative for abdominal pain, nausea and vomiting.  Endocrine: Negative for polydipsia and polyuria.  Genitourinary:  Negative for dysuria and hematuria.  Musculoskeletal:  Positive for arthralgias and neck pain. Negative for neck  stiffness.  Skin:  Negative for rash.  Allergic/Immunologic: Positive for environmental allergies.  Neurological:  Positive for weakness, numbness (LUE) and headaches. Negative for dizziness.  Psychiatric/Behavioral:  Positive for sleep disturbance. Negative for agitation and behavioral problems.       Objective:    Physical Exam Vitals reviewed.  Constitutional:      General: She is not in acute distress.    Appearance: She is not diaphoretic.  HENT:     Head: Normocephalic and atraumatic.     Nose: No congestion.     Mouth/Throat:     Mouth: Mucous membranes are moist.  Eyes:     General: No scleral icterus.    Extraocular Movements: Extraocular movements intact.  Cardiovascular:     Rate and Rhythm: Normal rate and regular rhythm.     Heart sounds: Normal heart sounds. No murmur heard. Pulmonary:     Breath sounds: Normal breath sounds. No wheezing or rales.  Musculoskeletal:     Cervical back: Neck supple. Tenderness present.     Right lower leg: Edema (Mild) present.     Left lower leg: Edema (Mild) present.  Skin:    General: Skin is warm.     Findings: No rash.  Neurological:     General: No focal deficit present.     Mental Status: She is alert and oriented to person, place, and time.     Sensory: No sensory deficit.     Motor: No weakness (B/l UE and LE - 4/5).  Psychiatric:        Mood and Affect: Mood is depressed.        Behavior: Behavior normal.     BP (!) 152/84 (BP  Location: Left Arm)   Pulse (!) 108   Ht 5' 4 (1.626 m)   Wt 121 lb 6.4 oz (55.1 kg)   SpO2 98%   BMI 20.84 kg/m  Wt Readings from Last 3 Encounters:  02/28/24 121 lb 6.4 oz (55.1 kg)  02/26/24 117 lb (53.1 kg)  02/20/24 126 lb (57.2 kg)    Lab Results  Component Value Date   TSH 3.550 02/19/2024   Lab Results  Component Value Date   WBC 7.7 02/21/2024   HGB 12.1 02/21/2024   HCT 39.3 02/21/2024   MCV 89.5 02/21/2024   PLT 189 02/21/2024   Lab Results  Component Value Date   NA 140 02/21/2024   K 3.1 (L) 02/21/2024   CO2 24 02/21/2024   GLUCOSE 87 02/21/2024   BUN 16 02/21/2024   CREATININE 0.60 02/21/2024   BILITOT 0.5 02/19/2024   ALKPHOS 90 02/19/2024   AST 22 02/19/2024   ALT 12 02/19/2024   PROT 8.1 02/19/2024   ALBUMIN 4.5 02/19/2024   CALCIUM 9.9 02/21/2024   ANIONGAP 10 02/21/2024   EGFR 94 02/12/2024   GFR 87.51 11/20/2019   Lab Results  Component Value Date   CHOL 194 02/12/2024   Lab Results  Component Value Date   HDL 48 02/12/2024   Lab Results  Component Value Date   LDLCALC 128 (H) 02/12/2024   Lab Results  Component Value Date   TRIG 100 02/12/2024   Lab Results  Component Value Date   CHOLHDL 4.0 02/12/2024   Lab Results  Component Value Date   HGBA1C 5.7 (H) 02/12/2024      Assessment & Plan:   Problem List Items Addressed This Visit       Cardiovascular and Mediastinum   HTN (hypertension) -  Primary   BP Readings from Last 1 Encounters:  02/28/24 (!) 152/84   Uncontrolled with hydralazine  50 mg 3 times daily and chlorthalidone 25 mg QD Was on Zebeta  5 mg once daily, Olmesartan  40 mg QD and spironolactone  25 mg BID at home prior to hospitalization, but was noncompliant Restart olmesartan  at 20 mg QD and Zebeta  5 mg QD Counseled for compliance with the medications Advised DASH diet and moderate exercise/walking, at least 150 mins/week      Relevant Medications   hydrALAZINE  (APRESOLINE ) 50 MG tablet    olmesartan  (BENICAR ) 20 MG tablet   bisoprolol  (ZEBETA ) 5 MG tablet     Respiratory   ILD (interstitial lung disease) (HCC)   Followed by ILD clinic and Rheumatology Was on Ofev  and CellCept , has been noncompliant in the past - medications placed back in her chart Needs follow-up with ILD clinic        Endocrine   Hypothyroidism   Lab Results  Component Value Date   TSH 3.550 02/19/2024   On NP thyroid  120 mg QD -needs to take it in a.m. on empty stomach Recently checked TSH and free T4 as she has chronic fatigue      Relevant Medications   bisoprolol  (ZEBETA ) 5 MG tablet     Nervous and Auditory   Acute metabolic encephalopathy   Now with resolved Was deemed to be due to uncontrolled hypertension/hypertensive emergency Needs to be compliant to her antihypertensive regimen Needs to maintain adequate hydration and eat at regular intervals Since she still has fatigue and generalized weakness, we will update for FMLA form        Other   Hospital discharge follow-up   Hospital chart reviewed including discharge summary Medications reconciled and reviewed with the patient in detail        Meds ordered this encounter  Medications   hydrALAZINE  (APRESOLINE ) 50 MG tablet    Sig: Take 1 tablet (50 mg total) by mouth 3 (three) times daily.    Dispense:  90 tablet    Refill:  3   olmesartan  (BENICAR ) 20 MG tablet    Sig: Take 1 tablet (20 mg total) by mouth daily.    Dispense:  30 tablet    Refill:  3    Dose change   bisoprolol  (ZEBETA ) 5 MG tablet    Sig: Take 1 tablet (5 mg total) by mouth daily.    Dispense:  90 tablet    Refill:  1    Follow-up: No follow-ups on file.    Suzzane MARLA Blanch, MD

## 2024-02-28 NOTE — Assessment & Plan Note (Signed)
 Followed by ILD clinic and Rheumatology Was on Ofev  and CellCept , has been noncompliant in the past - medications placed back in her chart Needs follow-up with ILD clinic

## 2024-02-28 NOTE — Assessment & Plan Note (Signed)
 BP Readings from Last 1 Encounters:  02/28/24 (!) 152/84   Uncontrolled with hydralazine  50 mg 3 times daily and chlorthalidone 25 mg QD Was on Zebeta  5 mg once daily, Olmesartan  40 mg QD and spironolactone  25 mg BID at home prior to hospitalization, but was noncompliant Restart olmesartan  at 20 mg QD and Zebeta  5 mg QD Counseled for compliance with the medications Advised DASH diet and moderate exercise/walking, at least 150 mins/week

## 2024-02-28 NOTE — Assessment & Plan Note (Signed)
 Now with resolved Was deemed to be due to uncontrolled hypertension/hypertensive emergency Needs to be compliant to her antihypertensive regimen Needs to maintain adequate hydration and eat at regular intervals Since she still has fatigue and generalized weakness, we will update for FMLA form

## 2024-02-29 ENCOUNTER — Telehealth: Payer: Self-pay | Admitting: Internal Medicine

## 2024-02-29 ENCOUNTER — Other Ambulatory Visit (HOSPITAL_BASED_OUTPATIENT_CLINIC_OR_DEPARTMENT_OTHER): Payer: Self-pay

## 2024-02-29 NOTE — Telephone Encounter (Signed)
 FMLA forms Noted Copied Sleeved Original forms placed in provider box Copy forms placed in front desk folder

## 2024-03-04 NOTE — Addendum Note (Signed)
 Addended byBETHA LEODIS NO S on: 03/04/2024 08:11 AM   Modules accepted: Orders

## 2024-03-06 ENCOUNTER — Ambulatory Visit (HOSPITAL_COMMUNITY)

## 2024-03-06 ENCOUNTER — Encounter (HOSPITAL_COMMUNITY): Payer: Self-pay

## 2024-03-06 DIAGNOSIS — R2689 Other abnormalities of gait and mobility: Secondary | ICD-10-CM

## 2024-03-06 DIAGNOSIS — M6281 Muscle weakness (generalized): Secondary | ICD-10-CM

## 2024-03-06 DIAGNOSIS — Z7409 Other reduced mobility: Secondary | ICD-10-CM

## 2024-03-06 NOTE — Telephone Encounter (Signed)
 Wants to specify that the paperwork should say return to work on November 3rd.
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
## 2024-03-06 NOTE — Telephone Encounter (Signed)
 Requesting to speak to clinical staff about FMLA paperwork that needs to be ammended. Patient states they are needing this as soon as possible.   Patient requesting callback as soon as possible, [phone]

## 2024-03-06 NOTE — Telephone Encounter (Signed)
 Pt called for status update on request, please advise if this has been submitted to [employer].   Best contact: [phone]

## 2024-03-10 ENCOUNTER — Telehealth: Payer: Self-pay

## 2024-03-10 NOTE — Telephone Encounter (Signed)
Attempted to contact pt unable to reach

## 2024-03-10 NOTE — Telephone Encounter (Signed)
 Left detailed vm, asking if there is another fax number I need to resend this to, I have confirmation fax from 10/28 was faxed to [phone]

## 2024-03-10 NOTE — Telephone Encounter (Signed)
 Copied from CRM [id number]. Topic: General - Other >> Mar 07, 2024  4:11 PM Kevelyn M wrote: Reason for CRM: Patient calling back again in regards to message that was sent yesterday.  Requesting to speak to clinical staff about FMLA paperwork that needs to be ammended. Patient states they are needing this as soon as possible.    Patient requesting callback as soon as possible, [phone]

## 2024-03-13 ENCOUNTER — Encounter (HOSPITAL_COMMUNITY): Payer: Self-pay

## 2024-03-13 ENCOUNTER — Ambulatory Visit (HOSPITAL_COMMUNITY): Attending: Internal Medicine

## 2024-03-13 DIAGNOSIS — R2689 Other abnormalities of gait and mobility: Secondary | ICD-10-CM | POA: Insufficient documentation

## 2024-03-13 DIAGNOSIS — Z7409 Other reduced mobility: Secondary | ICD-10-CM | POA: Insufficient documentation

## 2024-03-13 DIAGNOSIS — M6281 Muscle weakness (generalized): Secondary | ICD-10-CM | POA: Insufficient documentation
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
## 2024-03-18 ENCOUNTER — Ambulatory Visit: Admitting: Pulmonary Disease

## 2024-03-18 ENCOUNTER — Telehealth: Payer: Self-pay

## 2024-03-18 ENCOUNTER — Other Ambulatory Visit (HOSPITAL_BASED_OUTPATIENT_CLINIC_OR_DEPARTMENT_OTHER): Payer: Self-pay

## 2024-03-18 ENCOUNTER — Encounter: Payer: Self-pay | Admitting: Pulmonary Disease

## 2024-03-18 VITALS — BP 92/65 | Temp 97.8°F | Ht 64.0 in | Wt 122.0 lb

## 2024-03-18 DIAGNOSIS — I272 Pulmonary hypertension, unspecified: Secondary | ICD-10-CM | POA: Diagnosis not present

## 2024-03-18 DIAGNOSIS — J849 Interstitial pulmonary disease, unspecified: Secondary | ICD-10-CM | POA: Diagnosis not present

## 2024-03-18 DIAGNOSIS — R112 Nausea with vomiting, unspecified: Secondary | ICD-10-CM

## 2024-03-18 DIAGNOSIS — R0600 Dyspnea, unspecified: Secondary | ICD-10-CM

## 2024-03-18 DIAGNOSIS — I959 Hypotension, unspecified: Secondary | ICD-10-CM

## 2024-03-18 DIAGNOSIS — R5383 Other fatigue: Secondary | ICD-10-CM

## 2024-03-18 DIAGNOSIS — K219 Gastro-esophageal reflux disease without esophagitis: Secondary | ICD-10-CM | POA: Diagnosis not present

## 2024-03-18 DIAGNOSIS — R1310 Dysphagia, unspecified: Secondary | ICD-10-CM

## 2024-03-18 DIAGNOSIS — M349 Systemic sclerosis, unspecified: Secondary | ICD-10-CM

## 2024-03-18 DIAGNOSIS — Z5181 Encounter for therapeutic drug level monitoring: Secondary | ICD-10-CM

## 2024-03-18 DIAGNOSIS — R059 Cough, unspecified: Secondary | ICD-10-CM

## 2024-03-18 DIAGNOSIS — R634 Abnormal weight loss: Secondary | ICD-10-CM

## 2024-03-18 LAB — COMPREHENSIVE METABOLIC PANEL WITH GFR
ALT: 13 U/L (ref 0–35)
AST: 29 U/L (ref 0–37)
Albumin: 4.4 g/dL (ref 3.5–5.2)
Alkaline Phosphatase: 77 U/L (ref 39–117)
BUN: 16 mg/dL (ref 6–23)
CO2: 31 meq/L (ref 19–32)
Calcium: 11 mg/dL — ABNORMAL HIGH (ref 8.4–10.5)
Chloride: 101 meq/L (ref 96–112)
Creatinine, Ser: 0.72 mg/dL (ref 0.40–1.20)
GFR: 93.84 mL/min (ref >60.00)
Glucose, Bld: 90 mg/dL (ref 70–99)
Potassium: 3.6 meq/L (ref 3.5–5.1)
Sodium: 140 meq/L (ref 135–145)
Total Bilirubin: 0.5 mg/dL (ref 0.2–1.2)
Total Protein: 8 g/dL (ref 6.0–8.3)

## 2024-03-18 LAB — CBC WITH DIFFERENTIAL/PLATELET
Basophils Absolute: 0 K/uL (ref 0.0–0.1)
Basophils Relative: 0.4 % (ref 0.0–3.0)
Eosinophils Absolute: 0.1 K/uL (ref 0.0–0.7)
Eosinophils Relative: 1.9 % (ref 0.0–5.0)
HCT: 40.1 % (ref 36.0–46.0)
Hemoglobin: 13.1 g/dL (ref 12.0–15.0)
Lymphocytes Relative: 26.4 % (ref 12.0–46.0)
Lymphs Abs: 2 K/uL (ref 0.7–4.0)
MCHC: 32.7 g/dL (ref 30.0–36.0)
MCV: 84.7 fl (ref 78.0–100.0)
Monocytes Absolute: 0.6 K/uL (ref 0.1–1.0)
Monocytes Relative: 8.3 % (ref 3.0–12.0)
Neutro Abs: 4.7 K/uL (ref 1.4–7.7)
Neutrophils Relative %: 63 % (ref 43.0–77.0)
Platelets: 210 K/uL (ref 150.0–400.0)
RBC: 4.73 Mil/uL (ref 3.87–5.11)
RDW: 14 % (ref 11.5–15.5)
WBC: 7.5 K/uL (ref 4.0–10.5)

## 2024-03-18 LAB — BRAIN NATRIURETIC PEPTIDE: Pro B Natriuretic peptide (BNP): 57 pg/mL (ref 0.0–100.0)

## 2024-03-18 MED ORDER — VOQUEZNA 20 MG PO TABS
20.0000 mg | ORAL_TABLET | Freq: Once | ORAL | 3 refills | Status: DC
  Filled 2024-03-18: qty 30, 30d supply, fill #0

## 2024-03-18 NOTE — Telephone Encounter (Signed)
 Last benefits investigation for [employer]  was in April 2024. Per Dr. Jiles note 03/18/24, plan to restart [employer]  and [employer] .

## 2024-03-18 NOTE — Progress Notes (Addendum)
 Karen [name]    [id number]    July 26, 1970  Primary Care Physician:Patel, Suzzane POUR, MD  Referring Physician: Tobie Suzzane POUR, MD [address] [place],  [place] [address]   Problem list:  Scleroderma ILD with pulmonary HTN CellCept  started May 2019, Ofev  started December 2020  Pulmonary hypertension-started macitentan  April 2021 Transplant candidate at [facility]  HPI: 53 y.o.  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 Cherrie on 09/27/17 with no evidence of pulmonary hypertension Started on CellCept  May 2019 and uptitrated to max dose of 1.5 mg twice daily.  She had a hospitalization in [facility] in March 2019 for hypertension.  A CT chest scan at that time showed subtle lower lung findings of atelectasis, groundglass.  She has a right thyroid  nodule which was biopsied on 08/15/17 with pathology showing benign findings.  Thyroid  function tests are normal.  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 Finished pulmonary rehab in 2021 and is back at work full-time Repeat right heart cath 2021 by Dr. Cherrie showed mild pulmonary hypertension she has been started on  macitetan  Had annual follow-up at [facility] transplant program Oct 2023 when she las pulmonary function test that show a decline in DLCO from 39% to 35%. Reports worse exercise capacity and more fatigue She also had a repeat RHC in April 2023 which showed very mild PAH.  She was started on Actemra  by Dr. Ishmael due to decline in pulmonary function.  She got 2 doses around March and April 2024 but then developed allergic reaction to it which was stopped. Around May 2024 she had a visit with [facility] transplant center with PFTs that actually showed an improvement.  Interim history: Discussed the use of AI  scribe software for clinical note transcription with the patient, who gave verbal consent to proceed. History of Present Illness Karen [name] is a 53 year old female with scleroderma interstitial lung disease who presents with concerns about medication management and recent hospitalizations for TIA and hypertensive emergency.  Dyspnea and functional limitation - Increased shortness of breath since discontinuation of Cellcept  in August after hospitalization at [facility] for TIA - Fatigue impacting ability to work - Difficulty with physical activities, including dressing  Cough - Persistent cough since discontinuation of Cellcept   Medication management - Cellcept  discontinued during hospital stay in August - Continues Opsumit  for pulmonary hypertension despite medication changes during hospitalizations  Imaging surveillance - No CT scan performed since last year  Recent hospitalizations - Recent hospitalizations for transient ischemic attack (TIA) and hypertensive emergency   Relevant pulmonary history Pets: Dog, no cats, birds Occupation: Works as a CLINICAL BIOCHEMIST in cardiology clinic Exposures: Has crawlspace but no dampness.  No mold. No hot tub, Jacuzzi. She has down comforter.  Smoking history: Never smoker Travel history: Grew up in [place] .  Lived in [place]  and [place]  Relevant family history: No family history of lung disease.   Outpatient Encounter Medications as of 03/18/2024  Medication Sig   acetaminophen  (TYLENOL ) 325 MG tablet Take 2 tablets (650 mg total) by mouth every 6 (six) hours as needed for mild pain, fever or headache (or Fever >/= 101).   aspirin  81 MG chewable tablet Chew 1 tablet (81 mg total) by mouth daily.   bisoprolol  (ZEBETA ) 5 MG tablet Take  1 tablet (5 mg total) by mouth daily.   chlorthalidone (HYGROTON) 25 MG tablet Take 1 tablet (25 mg total) by mouth daily.   hydrALAZINE  (APRESOLINE ) 50 MG tablet Take 1 tablet (50 mg total) by mouth 3  (three) times daily.   macitentan  (OPSUMIT ) 10 MG tablet Take 1 tablet (10 mg total) by mouth daily.   mycophenolate  (CELLCEPT ) 500 MG tablet Take 1,000 mg by mouth 2 (two) times daily.   Nintedanib  (OFEV ) 150 MG CAPS Take 150 mg by mouth 2 (two) times daily.   NP THYROID  120 MG tablet Take 1 tablet (120 mg total) by mouth daily before breakfast.   olmesartan  (BENICAR ) 20 MG tablet Take 1 tablet (20 mg total) by mouth daily.   ondansetron  (ZOFRAN ) 4 MG tablet Take 1 tablet (4 mg total) by mouth every 6 (six) hours as needed for nausea.   traZODone  (DESYREL ) 50 MG tablet Take 1/2-1 tablets (25-50 mg total) by mouth at bedtime as needed for sleep.   cephALEXin  (KEFLEX ) 500 MG capsule Take 1 capsule (500 mg total) by mouth 2 (two) times daily. (Patient not taking: Reported on 03/18/2024)   [DISCONTINUED] PARoxetine  (PAXIL ) 10 MG tablet Take 1 tablet (10 mg total) by mouth daily as directed   No facility-administered encounter medications on file as of 03/18/2024.    Physical Exam: Blood pressure 92/65, temperature 97.8 F (36.6 C), temperature source Oral, height 5' 4 (1.626 m), weight 122 lb (55.3 kg). Gen:      No acute distress HEENT:  EOMI, sclera anicteric Neck:     No masses; no thyromegaly Lungs:    Mild bibasilar crackles CV:         Regular rate and rhythm; no murmurs Abd:      + bowel sounds; soft, non-tender; no palpable masses, no distension Ext:    No edema; adequate peripheral perfusion Neuro: alert and oriented x 3 Psych: normal mood and affect   Data Reviewed: Imaging CT chest, [facility] 07/24/2017 Dependent atelectasis, subtle groundglass opacities at the base.  2.5 cm right thyroid  nodule.  I have reviewed the images personally  CT high-resolution 09/18/2017- patchy reticular groundglass opacities, reticulation with subpleural sparing.  Mild traction bronchiectasis.  No honeycombing.  Patulous esophagus, multinodular goiter  CT high-res 01/29/2018- stable  interstitial lung disease consistent with NSIP.  Aortic atherosclerosis, three-vessel coronary artery disease.  CT high-resolution 06/27/2018-stable interstitial lung disease.  CT high-resolution 06/11/2019-stable interstitial lung disease  CT high-resolution 05/19/2020-stable interstitial lung disease  CT high-resolution 11/10/2021-interstitial lung disease with slight progression compared to before  CT high-resolution 03/22/2023-stable interstitial lung disease.  subtle increase in centrilobular nodularity I have reviewed the images personally.  PFTs FENO 09/06/2017-unable to complete  07/27/2017 FVC 1.65 (54%), FEV1 1.53 (62%], F/F 93, TLC 50, DLCO 44%, DLCO/VA 131%  10/25/2017 FVC 1.51 [50%], FEV1 1.26 [51%], F/F 83  01/29/2018 FVC 1.69 [5%), FEV1 1.50 [61%], F/F 89, DLCO 10.57 [41%]  07/01/2018 FVC 1.57 [52%), FEV1 1.47 [60%], F/F 93, DLCO 12.54 [59%)  01/06/2019 FVC 1.58 [52%], FEV1 1.46 [60%], F/F 92, TLC 2.62 [50%], DLCO 9.16 [42%] Severe restriction and diffusion impairment.  FVC is stable but DLCO has worsened.  6-minute walk  10/23/2017- 144 m Post walk heart rate, stats 94, 91%  02/04/2018- 249 m Post walk heart rate, sats 101, 99%  06/06/2018-212 m Post walk heart rate, sats 86, 92%  01/30/2019-172m Post walk heart rate, sats 83, 98%  01/13/21-238 m  Labs 08/20/2017-ANA, centromere, rheumatoid factor, SCL 70-negative  QuantiFERON 09/12/2017-negative Hepatitis B, C screening 09/21/2017-negative G6PD 09/25/2017-13  Labs from Dr. Ishmael 07/12/17 Rheumatoid factor < 10, CCP-8, ANCA-negative, double-stranded DNA-negative, Smith antibody-negative, SCL 70-negative, RNP-negative SSA 5.8, SSB 1.5 Smith antibody-negative, ANA IFA-negative Myositis panel-negative CK 165, C4-30, C3-168, total complement greater than 60, aldolase 8.8 Sed rate 9, CRP 0.9  CBC, hepatic panel 09/16/2020-within normal limits  Cardiac Cardiac cath 09/05/2019 PA = 38/16 (25) PCW = 6 Fick cardiac  output/index = 5.5/3.4 PVR = 3.4 WU Mild pulmonary hypertension  Cardiac cath 08/16/2021 PA = 36/14 (23) PCW = 3 Fick cardiac output/index = 7.3/4.4 PVR = 2.7  Overnight oximetry 01/30/2019- test time 8 hours 25 minutes Lowest O2 sat 87%.  Mean O2 sat 95%.  Time spent less than 88% - 1 minute 30 seconds No significant desaturation.  Does not need supplemental oxygen .  Assessment & Plan Scleroderma-associated interstitial lung disease Off CellCept  and Ofev  for nearly three months due to pharmacy issues, leading to worsening symptoms including fatigue and shortness of breath. No recent CT scan since last year. - Will restart CellCept  and Ofev  - Ordered CT scan and pulmonary function tests (PFTs) - Will reassess interstitial lung disease  Pulmonary hypertension Continues on Opsumit  for pulmonary hypertension. Symptoms include shortness of breath and fatigue, possibly due to medication lapse or disease progression. Echocardiogram scheduled to assess pulmonary hypertension status. - Ordered echocardiogram to assess pulmonary hypertension - Continue Opsumit   Gastroesophageal reflux disease with associated nausea and vomiting Experiencing nausea and vomiting, possibly related to GERD. Previously on Voquezna , which was effective but discontinued due to pharmacy issues. Currently experiencing symptoms again. - Prescribed Voquezna  20 mg once daily - Follow up with GI specialist on December 11th  Fatigue, weakness, and unintentional weight loss Reports significant fatigue, weakness, and unintentional weight loss, possibly related to medication changes and disease progression. Blood pressure management may also contribute to symptoms. - Ordered labs to assess current status  Hypertension, currently overtreated with hypotension Blood pressure currently low, possibly due to overtreatment. Recent medication changes include hydralazine , olmesartan , bisoprolol , and chlorthiazide. Experiencing  fatigue and low energy, possibly related to low blood pressure.  She has a prescription for hydralazine  50 mg tid from primary care but is actually taking 25 mg tid as per patient and still her blood pressure is low - Adjust hydralazine  to 25 mg two times a day - Monitor blood pressure closely - Follow up with primary care physician for further management  Cough Persistent cough, possibly related to lung disease or GERD. Symptoms may be exacerbated by low blood pressure and medication changes. - Address underlying causes with medication adjustments and follow-up  Dysphagia Esophageal dysfunction, esophageal stricture, GERD Follows with GI.  Restart voquenza as she is out of this medication   Plan/Recommendations: Restart CellCept , Ofev  Check labs for baseline assessment High-res CT, PFTs Prescribe Voquenza for GERD  I personally spent a total of 60 minutes in the care of the patient today including preparing to see the patient, getting/reviewing separately obtained history, counseling and educating, placing orders, referring and communicating with other health care professionals, independently interpreting results, and coordinating care.   Wilgus Deyton MD [facility] 03/18/2024, 1:26 PM

## 2024-03-18 NOTE — Telephone Encounter (Signed)
 Mannam, Praveen, MD  P Rx Rheum/Pulm; Veronia Aleck CROME, RPH This is a patient who is on MMF [CellCept ] 1500 mg twice daily in liquid form as she could not tolerate the pills and nintedanib .  Looks like she was taken off these medications after her hospitalization at [facility] in August.  Can we resend the prescription and get her back on treatment please.  Thank you

## 2024-03-18 NOTE — Patient Instructions (Addendum)
  VISIT SUMMARY: During your visit, we discussed your concerns about medication management and recent hospitalizations for TIA. We addressed your increased shortness of breath, persistent cough, and fatigue since discontinuing Cellcept . We also reviewed your pulmonary hypertension, GERD symptoms, and blood pressure management.  YOUR PLAN: SCLERODERMA-ASSOCIATED INTERSTITIAL LUNG DISEASE: Your symptoms have worsened since stopping Cellcept  and Ofev , including fatigue and shortness of breath. -We will restart Cellcept  and Ofev . -A CT scan and pulmonary function tests (PFTs) have been ordered. -We will reassess your interstitial lung disease after the tests.  PULMONARY HYPERTENSION: You continue to experience shortness of breath and fatigue, possibly due to medication lapse or disease progression. -An echocardiogram has been ordered to assess your pulmonary hypertension. -Continue taking Opsumit .  GASTROESOPHAGEAL REFLUX DISEASE (GERD): You are experiencing nausea and vomiting, possibly related to GERD. -We have prescribed Voquezna  20 mg once daily. -Follow up with your GI specialist on December 11th.  FATIGUE, WEAKNESS, AND UNINTENTIONAL WEIGHT LOSS: You report significant fatigue, weakness, and unintentional weight loss, possibly related to medication changes and disease progression. -We have ordered labs to assess your current status.  HYPERTENSION: Your blood pressure is currently low, possibly due to overtreatment, and you are experiencing fatigue and low energy. -Adjust hydralazine  to 25 mg two times a day. -Monitor your blood pressure closely. -Follow up with your primary care physician for further management.  COUGH: You have a persistent cough, possibly related to lung disease or GERD. -We will address the underlying causes with medication adjustments and follow-up.

## 2024-03-18 NOTE — Telephone Encounter (Signed)
 error

## 2024-03-19 ENCOUNTER — Encounter (HOSPITAL_COMMUNITY): Payer: Self-pay

## 2024-03-19 ENCOUNTER — Other Ambulatory Visit (HOSPITAL_COMMUNITY): Payer: Self-pay

## 2024-03-19 ENCOUNTER — Telehealth: Payer: Self-pay

## 2024-03-19 ENCOUNTER — Ambulatory Visit: Payer: Self-pay | Admitting: Pulmonary Disease

## 2024-03-19 ENCOUNTER — Other Ambulatory Visit: Payer: Self-pay

## 2024-03-19 ENCOUNTER — Telehealth (HOSPITAL_COMMUNITY): Payer: Self-pay

## 2024-03-19 ENCOUNTER — Other Ambulatory Visit (HOSPITAL_BASED_OUTPATIENT_CLINIC_OR_DEPARTMENT_OTHER): Payer: Self-pay

## 2024-03-19 ENCOUNTER — Encounter: Payer: Self-pay | Admitting: Gastroenterology

## 2024-03-19 MED ORDER — VOQUEZNA 20 MG PO TABS
20.0000 mg | ORAL_TABLET | Freq: Every day | ORAL | 1 refills | Status: AC
Start: 2024-03-19 — End: ?
  Filled 2024-03-19: qty 30, 30d supply, fill #0

## 2024-03-19 NOTE — Telephone Encounter (Signed)
 Submitted a Prior Authorization request to [facility] for OFEV  via CoverMyMeds. Will update once we receive a response.  Key: [id number]

## 2024-03-19 NOTE — Telephone Encounter (Signed)
*  Pulm  Pharmacy Patient Advocate Encounter   Received notification from CoverMyMeds that prior authorization for Voquezna  20MG  tablets  is required/requested.   Insurance verification completed.   The patient is insured through [facility].   Per test claim: PA required; However, NEW/RECENT labs/notes are needed to complete & submit PA request. Please see below.  Latent Key: [id number]  Medication is to be prescribed by a gastroenterologist, last seen by Mariellen in Jan and next appt in Dec. Please advise

## 2024-03-19 NOTE — Telephone Encounter (Signed)
 Received notification from [facility] regarding a prior authorization for OFEV . Authorization has been APPROVED from 03/19/24 to 03/18/25. Approval letter sent to scan center.  Per test claim, copay for 30 days supply is $0  Patient can fill with [facility]: [phone]  Authorization # [id number] Phone # [phone]

## 2024-03-19 NOTE — Telephone Encounter (Signed)
 Script re-written by gastro. Nothing further needed.

## 2024-03-20 ENCOUNTER — Ambulatory Visit
Admission: RE | Admit: 2024-03-20 | Discharge: 2024-03-20 | Disposition: A | Discharge status: Home / Self Care | Source: Ambulatory Visit | Attending: Pulmonary Disease | Admitting: Pulmonary Disease

## 2024-03-20 ENCOUNTER — Other Ambulatory Visit (HOSPITAL_COMMUNITY): Payer: Self-pay

## 2024-03-20 ENCOUNTER — Encounter (HOSPITAL_COMMUNITY): Payer: Self-pay

## 2024-03-20 ENCOUNTER — Telehealth: Payer: Self-pay

## 2024-03-20 ENCOUNTER — Ambulatory Visit (HOSPITAL_COMMUNITY)

## 2024-03-20 DIAGNOSIS — J849 Interstitial pulmonary disease, unspecified: Secondary | ICD-10-CM

## 2024-03-20 NOTE — Telephone Encounter (Signed)
 ATC patient regarding RESTART Ofev  & CellCept .   LVM at [phone] with my direct line to return call.

## 2024-03-20 NOTE — Telephone Encounter (Signed)
 Pharmacy Patient Advocate Encounter   Received notification from Patient Advice Request messages that prior authorization for Voquezna  20MG  tablet is required/requested.   Insurance verification completed.   The patient is insured through [facility].   Per test claim: xxx

## 2024-03-24 NOTE — Telephone Encounter (Signed)
 Spoke to patient - she answered call but requested brevity as she is at work.   She requests a call back tomorrow during her lunch break around 12:30 PM.  Will return call at 12:30 PM on 03/25/24.

## 2024-03-25 ENCOUNTER — Other Ambulatory Visit (HOSPITAL_COMMUNITY): Payer: Self-pay

## 2024-03-25 ENCOUNTER — Ambulatory Visit: Attending: Pulmonary Disease

## 2024-03-25 DIAGNOSIS — Z5181 Encounter for therapeutic drug level monitoring: Secondary | ICD-10-CM

## 2024-03-25 DIAGNOSIS — J849 Interstitial pulmonary disease, unspecified: Secondary | ICD-10-CM

## 2024-03-25 MED ORDER — MYCOPHENOLATE MOFETIL 200 MG/ML PO SUSR
1500.0000 mg | Freq: Two times a day (BID) | ORAL | 3 refills | Status: DC
  Filled 2024-03-25: qty 480, 32d supply, fill #0

## 2024-03-25 MED ORDER — OFEV 150 MG PO CAPS
150.0000 mg | ORAL_CAPSULE | Freq: Two times a day (BID) | ORAL | 3 refills | Status: DC
  Filled 2024-03-27: qty 60, 30d supply, fill #0

## 2024-03-25 MED ORDER — MYCOPHENOLATE MOFETIL 200 MG/ML PO SUSR
ORAL | 0 refills | Status: AC
  Filled 2024-03-25: qty 210, 28d supply, fill #0
  Filled 2024-03-26 – 2024-04-07 (×2): qty 320, 35d supply, fill #0
  Filled 2024-04-07: qty 320, fill #0

## 2024-03-25 NOTE — Progress Notes (Signed)
 [name]  Referring Provider: Dr. Lonna Coder  Virtual Visit via Telephone Note  I connected with Karen [name] on 03/25/24 at 12:30 PM EST by telephone and verified that I am speaking with the correct person using two identifiers.  Location: Patient: work, on lunch break in private location  Provider: office   I discussed the limitations, risks, security and privacy concerns of performing an evaluation and management service by telephone and the availability of in person appointments. I also discussed with the patient that there may be a patient responsible charge related to this service. The patient expressed understanding and agreed to proceed.  HPI: Karen [name] is a 53 y.o. female who presents to the pharmacotherapy clinic via telephone for restarting CellCept  and Ofev  after several months off both medications.  Last seen by Dr. Coder on 03/18/24. PMH includes scleroderma ILD with pulmonary HTN. She was started on CellCept  in 2019 and titrated to 1,500mg  BID. Ofev  started December 2020. For unclear reasons, she has been off Ofev  and CellCept  for several months after hospitalization at [facility] in August 2025 for TIA/hypertensive emergency. Worsening dyspnea noted. Plan to restart Ofev  and CellCept .  Patient Active Problem List   Diagnosis Date Noted   Hypertensive urgency 02/20/2024   ILD (interstitial lung disease) (HCC) 02/20/2024   Paresthesia 02/19/2024   Gastroesophageal reflux disease 02/19/2024   Pain in limb 02/19/2024   Acute metabolic encephalopathy 02/19/2024   Cerebral vascular disease 01/23/2024   Left hand paresthesia 01/23/2024   Peripheral polyneuropathy 01/23/2024   Neck pain 01/23/2024   Physical deconditioning 12/31/2023   History of TIA (transient ischemic attack) 12/28/2023   Primary insomnia 12/28/2023   Acute non-recurrent frontal sinusitis 12/28/2023   Mild protein-calorie malnutrition 09/13/2023   Hypomagnesemia 09/13/2023    Cervical radicular pain 08/08/2023   UTI (urinary tract infection) 02/04/2023   Encounter for general adult medical examination with abnormal findings 02/01/2023   Cervical radiculopathy 12/21/2022   Acute cystitis without hematuria 10/11/2022   Chronic diastolic CHF (congestive heart failure) (HCC) 10/11/2022   Hypokalemia 08/17/2022   Hospital discharge follow-up 06/22/2022   Allergic rhinitis 04/13/2022   Encounter for examination following treatment at hospital 02/16/2022   Delayed gastric emptying 11/10/2021   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   Raynaud's disease 11/10/2021   Bilateral impacted cerumen 09/08/2021   Chronic nonintractable headache 09/08/2021   Pulmonary hypertension (HCC) 08/04/2021   Hypothyroidism 08/04/2021   Thyroid  nodule 08/04/2021   Primary osteoarthritis 04/07/2021   Dermatofibroma 03/25/2020   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   Vitamin D  deficiency disease 02/27/2019   Hot flashes due to menopause 02/27/2019   Constipation 10/11/2018   Gastroparesis 07/03/2018   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 THEN take 5mL (1,000mg  total) by mouth twice daily for 14 days. THEN, take 7.30mL (1,500mg ) by mouth twice daily thereafter.  Previous Medications   ACETAMINOPHEN  (TYLENOL ) 325 MG TABLET    Take 2 tablets (650 mg total) by mouth every 6 (six) hours as needed for mild pain, fever  or headache (or Fever >/= 101).   ASPIRIN  81 MG CHEWABLE TABLET     Chew 1 tablet (81 mg total) by mouth daily.   BISOPROLOL  (ZEBETA ) 5 MG TABLET    Take 1 tablet (5 mg total) by mouth daily.   CEPHALEXIN  (KEFLEX ) 500 MG CAPSULE    Take 1 capsule (500 mg total) by mouth 2 (two) times daily.   CHLORTHALIDONE (HYGROTON) 25 MG TABLET    Take 1 tablet (25 mg total) by mouth daily.   HYDRALAZINE  (APRESOLINE ) 50 MG TABLET    Take 1 tablet (50 mg total) by mouth 3 (three) times daily.   MACITENTAN  (OPSUMIT ) 10 MG TABLET    Take 1 tablet (10 mg total) by mouth daily.   NINTEDANIB  (OFEV ) 150 MG CAPS    Take 150 mg by mouth 2 (two) times daily.   NP THYROID  120 MG TABLET    Take 1 tablet (120 mg total) by mouth daily before breakfast.   OLMESARTAN  (BENICAR ) 20 MG TABLET    Take 1 tablet (20 mg total) by mouth daily.   ONDANSETRON  (ZOFRAN ) 4 MG TABLET    Take 1 tablet (4 mg total) by mouth every 6 (six) hours as needed for nausea.   TRAZODONE  (DESYREL ) 50 MG TABLET    Take 1/2-1 tablets (25-50 mg total) by mouth at bedtime as needed for sleep.   VONOPRAZAN FUMARATE  (VOQUEZNA ) 20 MG TABS    Take 1 tablet (20 mg tablet) by mouth daily.  Modified Medications   No medications on file  Discontinued Medications   MYCOPHENOLATE  (CELLCEPT ) 500 MG TABLET    Take 1,000 mg by mouth 2 (two) times daily.    Allergies: Allergies  Allergen Reactions   Tocilizumab  Shortness Of Breath, Swelling and Other (See Comments)    Swelling and shortness of breath sharpe pains in back and chest tightness. 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   Cerebral vascular disease 01/23/2024    Cervical radicular pain 08/08/2023   CHF (congestive heart failure) (HCC)    Chronic diastolic CHF (congestive heart failure) (HCC) 10/11/2022   Chronic GERD 12/18/2017   Chronic nonintractable headache 09/08/2021   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   Perimenopause 02/27/2019   Peripheral polyneuropathy 01/23/2024   Primary osteoarthritis 04/07/2021   Pulmonary edema    Pulmonary hypertension (HCC) 08/04/2021   Raynaud's disease 11/10/2021   Scleroderma (HCC)    Sjogren's syndrome 08/30/2017   Status post laparoscopic cholecystectomy 02/13/20 02/13/2020   Thyroid  nodule 08/04/2021   Vitamin D  deficiency disease 02/27/2019    Social History: Social History   Socioeconomic History   Marital status: Single    Spouse name: Not  on file   Number of children: 0   Years of education: Not on file   Highest education level: Associate degree: occupational, scientist, product/process development, or vocational program  Occupational History   Occupation: CMA    Employer: [employer]   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   Social Drivers of Corporate Investment Banker Strain: Low Risk  (12/25/2023)   Received from [facility]   Overall Financial Resource Strain (CARDIA)    How hard is it for you to pay for the very basics like food, housing, medical care, and heating?: Not very hard  Food Insecurity: No Food Insecurity (02/20/2024)   Hunger Vital Sign    Worried About Running Out of Food in the Last Year: Never true    Ran Out of Food in the Last Year: Never true  Transportation Needs: No Transportation Needs (02/20/2024)   PRAPARE - Administrator, Civil Service (Medical): No    Lack of Transportation (Non-Medical): No  Physical Activity: Inactive (12/25/2023)   [facility] from [facility]   Exercise Vital Sign    On average, how many days per week do you engage in moderate to strenuous exercise (like a brisk walk)?: 0 days    On average, how many minutes do you engage in exercise at this level?: 0 min  Stress: No Stress Concern Present (12/25/2023)   Received from [facility] of Occupational Health - Occupational Stress Questionnaire    Do you feel stress - tense, restless, nervous, or anxious, or unable to sleep at night because your mind is troubled all the time - these days?: Only a little  Social Connections: Socially Integrated (12/25/2023)   Received from [facility]   Social Connection and Isolation Panel    In a typical week, how many times do you talk on the phone with family, friends, or neighbors?: More than three times a week    How often do you get together with friends or relatives?: More than three times a week    How often do you attend church or religious services?: More than 4 times per year    Do you belong to any clubs or organizations such as church groups, unions, fraternal or athletic groups, or school groups?: Yes    How often do you attend meetings of the clubs or organizations you belong to?: 1 to 4 times per year    Are you married, widowed, divorced, separated, never married, or living with a  partner?: Living with partner    Medication(s): 1) Ofev  counseling Patient counseled on purpose, proper use, and potential adverse effects including diarrhea, nausea, vomiting, abdominal pain, decreased appetite, and weight loss.. Stressed the importance of routine lab monitoring. Will monitor LFT's every month for the first 6 months of treatment then every 3 months thereafter.  Ofev  dose will be 150 mg capsule every 12 hours with food. Stressed importance of taking with food to minimize stomach upset.  2) CellCept  counseling - patient unable to tolerate tablets, plan to restart liquid Patient was  counseled on the purpose, proper use, and adverse effects of mycophenolate  including risk of infection, new or reactivation of viral infections, nausea, and headaches.    CellCept  is an immunosuppressive agent, so you should HOLD CellCept  during an active infection.   Discussed risk of neutropenia and discussed importance of regular labs to monitor blood counts, liver function, and kidney function.   Medication review: - Drug interactions: Voquezna  + CellCept  - may decrease serum concentrations of mycophenolate  (CellCept ). Recommend to separate administration of the two medications by several hours if possible. Monitor for decreased efficacy of CellCept  at follow-up with pulmonology team.   Plan: - RESTART Ofev  150mg  capsule by mouth twice daily with food - RESTART CellCept  suspension with retitration d/t risk of GI side effects and potential impact on blood counts - Take 2.4mL (500mg  total) by mouth twice daily x 14 days, THEN take 5 mL (1,000mg  total) by mouth twice daily x 14 days, THEN take 7.5mL (1,500mg  total) by mouth twice daily thereafter.  - CBC/CMET standing orders entered for monitoring every ~2 weeks after restart and dose titration of CellCept  - LFTs standing orders entered for monthly LFT monitoring x 3 months on Ofev  restart - Patient requests to schedule lab appointments with [facility] in [place]. Will message front desk to assist with scheduling.  - First set of labs due week of 04/07/24. Can obtain second set of labs on the same day as office visit with Dr. Theophilus on 04/17/24.   I discussed the assessment and treatment plan with the patient. The patient was provided an opportunity to ask questions and all were answered. The patient agreed with the plan and demonstrated an understanding of the instructions.   The patient was advised to call back or seek an in-person evaluation if the symptoms worsen or if the condition fails to improve as anticipated.  I provided 15 minutes of non-face-to-face time during this encounter.  Aleck Puls, PharmD, BCPS, CPP Clinical Pharmacist  [facility]

## 2024-03-25 NOTE — Telephone Encounter (Signed)
 Attempted to call patient. Left voicemail for patient to call back to get scheduled for lab appointment.

## 2024-03-25 NOTE — Telephone Encounter (Signed)
 Encounter completed - see pharmacotherapy visit note 03/25/24.

## 2024-03-26 ENCOUNTER — Other Ambulatory Visit (HOSPITAL_COMMUNITY): Payer: Self-pay

## 2024-03-26 ENCOUNTER — Other Ambulatory Visit

## 2024-03-27 ENCOUNTER — Ambulatory Visit (HOSPITAL_COMMUNITY)
Admission: RE | Admit: 2024-03-27 | Discharge: 2024-03-27 | Disposition: A | Discharge status: Home / Self Care | Source: Ambulatory Visit | Attending: Internal Medicine | Admitting: Internal Medicine

## 2024-03-27 ENCOUNTER — Other Ambulatory Visit: Payer: Self-pay

## 2024-03-27 ENCOUNTER — Ambulatory Visit: Payer: Self-pay | Admitting: Neurology

## 2024-03-27 DIAGNOSIS — I679 Cerebrovascular disease, unspecified: Secondary | ICD-10-CM | POA: Insufficient documentation

## 2024-03-27 DIAGNOSIS — G629 Polyneuropathy, unspecified: Secondary | ICD-10-CM | POA: Diagnosis present

## 2024-03-27 DIAGNOSIS — M542 Cervicalgia: Secondary | ICD-10-CM | POA: Diagnosis present

## 2024-03-27 DIAGNOSIS — G459 Transient cerebral ischemic attack, unspecified: Secondary | ICD-10-CM | POA: Diagnosis not present

## 2024-03-27 DIAGNOSIS — R202 Paresthesia of skin: Secondary | ICD-10-CM | POA: Insufficient documentation

## 2024-03-27 LAB — ECHOCARDIOGRAM COMPLETE
AR max vel: 1.88 cm2
AV Area VTI: 1.91 cm2
AV Area mean vel: 1.81 cm2
AV Mean grad: 3.8 mmHg
AV Peak grad: 6.9 mmHg
Ao pk vel: 1.31 m/s
Area-P 1/2: 2.26 cm2
S' Lateral: 2.2 cm

## 2024-03-27 NOTE — Progress Notes (Unsigned)
 Specialty Pharmacy Initial Fill Coordination Note  Karen [name] is a 53 y.o. female contacted today regarding initial fill of specialty medication(s) Nintedanib  Esylate (Ofev )   Patient requested Delivery   Delivery date: 03/31/24   Verified address: [address], [place], [place] [address]   Medication will be filled on: 03/28/24   Patient is aware of $0 copayment.

## 2024-03-27 NOTE — Progress Notes (Signed)
*  PRELIMINARY RESULTS* Echocardiogram 2D Echocardiogram has been performed.  Karen [name] 03/27/2024, 11:40 AM

## 2024-03-28 ENCOUNTER — Other Ambulatory Visit: Payer: Self-pay

## 2024-03-31 ENCOUNTER — Other Ambulatory Visit: Payer: Self-pay

## 2024-03-31 ENCOUNTER — Other Ambulatory Visit (HOSPITAL_COMMUNITY): Payer: Self-pay

## 2024-04-01 ENCOUNTER — Ambulatory Visit (HOSPITAL_COMMUNITY)

## 2024-04-01 ENCOUNTER — Other Ambulatory Visit: Payer: Self-pay

## 2024-04-01 NOTE — Progress Notes (Signed)
 Patient is due for a visit with [facility]. He has reached out a few times-pt has not returned calls. Last visit was April of 2024.

## 2024-04-02 ENCOUNTER — Other Ambulatory Visit (HOSPITAL_COMMUNITY): Payer: Self-pay

## 2024-04-02 ENCOUNTER — Other Ambulatory Visit: Payer: Self-pay

## 2024-04-02 NOTE — Progress Notes (Signed)
 Called patient - she was at work when she answered. I gave her [facility] number. She said she will try to call but she is in clinic at work. I explained that she will need to speak with Herlene before pharmacy can ship medication.

## 2024-04-07 ENCOUNTER — Other Ambulatory Visit: Payer: Self-pay

## 2024-04-07 ENCOUNTER — Other Ambulatory Visit (HOSPITAL_COMMUNITY): Payer: Self-pay

## 2024-04-07 ENCOUNTER — Other Ambulatory Visit (HOSPITAL_BASED_OUTPATIENT_CLINIC_OR_DEPARTMENT_OTHER): Payer: Self-pay

## 2024-04-07 ENCOUNTER — Ambulatory Visit: Attending: Pulmonary Disease

## 2024-04-07 DIAGNOSIS — J849 Interstitial pulmonary disease, unspecified: Secondary | ICD-10-CM

## 2024-04-07 MED ORDER — OFEV 150 MG PO CAPS
150.0000 mg | ORAL_CAPSULE | Freq: Two times a day (BID) | ORAL | 3 refills | Status: AC
  Filled 2024-04-07: qty 60, 30d supply, fill #0
  Filled 2024-04-29 (×2): qty 60, 30d supply, fill #1

## 2024-04-07 NOTE — Addendum Note (Signed)
 Addended by: Devlin Mcveigh L on: 04/07/2024 03:30 PM   Modules accepted: Orders

## 2024-04-07 NOTE — Progress Notes (Signed)
 [facility]  Referring Provider: Dr. Lonna Coder  Virtual Visit via Telephone Note  I connected with Karen [name] on 04/02/24 at 12:00 PM by telephone and verified that I am speaking with the correct person using two identifiers.   Of note, this is delayed documentation. Date and time of actual visit are listed above.  Location: Patient: work Provider: office   I discussed the limitations, risks, security and privacy concerns of performing an evaluation and management service by telephone and the availability of in person appointments. I also discussed with the patient that there may be a patient responsible charge related to this service. The patient expressed understanding and agreed to proceed.   HPI: Karen [name] is a 53 y.o. female who presents to the pharmacotherapy clinic via telephone for restart Ofev .  Patient Active Problem List   Diagnosis Date Noted   Hypertensive urgency 02/20/2024   ILD (interstitial lung disease) (HCC) 02/20/2024   Paresthesia 02/19/2024   Gastroesophageal reflux disease 02/19/2024   Pain in limb 02/19/2024   Acute metabolic encephalopathy 02/19/2024   Cerebral vascular disease 01/23/2024   Left hand paresthesia 01/23/2024   Peripheral polyneuropathy 01/23/2024   Neck pain 01/23/2024   Physical deconditioning 12/31/2023   History of TIA (transient ischemic attack) 12/28/2023   Primary insomnia 12/28/2023   Acute non-recurrent frontal sinusitis 12/28/2023   Mild protein-calorie malnutrition 09/13/2023   Hypomagnesemia 09/13/2023   Cervical radicular pain 08/08/2023   UTI (urinary tract infection) 02/04/2023   Encounter for general adult medical examination with abnormal findings 02/01/2023   Cervical radiculopathy 12/21/2022   Acute cystitis without hematuria 10/11/2022   Chronic diastolic CHF (congestive heart failure) (HCC) 10/11/2022   Hypokalemia 08/17/2022   Hospital discharge follow-up 06/22/2022    Allergic rhinitis 04/13/2022   Encounter for examination following treatment at hospital 02/16/2022   Delayed gastric emptying 11/10/2021   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   Raynaud's disease 11/10/2021   Bilateral impacted cerumen 09/08/2021   Chronic nonintractable headache 09/08/2021   Pulmonary hypertension (HCC) 08/04/2021   Hypothyroidism 08/04/2021   Thyroid  nodule 08/04/2021   Primary osteoarthritis 04/07/2021   Dermatofibroma 03/25/2020   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   Vitamin D  deficiency disease 02/27/2019   Hot flashes due to menopause 02/27/2019   Constipation 10/11/2018   Gastroparesis 07/03/2018   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   ASPIRIN  81 MG CHEWABLE TABLET    Chew 1 tablet (81 mg total) by mouth daily.   BISOPROLOL  (ZEBETA ) 5 MG TABLET    Take 1 tablet (5 mg total) by mouth daily.   CHLORTHALIDONE  (HYGROTON ) 25 MG TABLET    Take 1 tablet (25 mg total) by mouth daily.   HYDRALAZINE  (APRESOLINE ) 50 MG TABLET    Take 1 tablet (50 mg total) by mouth 3 (three) times daily.   MACITENTAN  (OPSUMIT ) 10 MG TABLET    Take 1  tablet (10 mg total) by mouth daily.   MYCOPHENOLATE  (CELLCEPT ) 200 MG/ML SUSPENSION    Take 2.5mL (500mg  total) by mouth twice daily for 14 days. THEN take 5mL (1,000mg  total) by mouth twice daily for 14 days. THEN, take 7.51mL  (1,500mg ) by mouth twice daily thereafter.   MYCOPHENOLATE  (CELLCEPT ) 200 MG/ML SUSPENSION    Take 7.5 mLs (1,500 mg total) by mouth 2 (two) times daily.   NINTEDANIB  (OFEV ) 150 MG CAPS    Take 1 capsule (150 mg total) by mouth 2 (two) times daily.   NP THYROID  120 MG TABLET    Take 1 tablet (120 mg total) by mouth daily before breakfast.   OLMESARTAN  (BENICAR ) 20 MG TABLET    Take 1 tablet (20 mg total) by mouth daily.   ONDANSETRON  (ZOFRAN ) 4 MG TABLET    Take 1 tablet (4 mg total) by mouth every 6 (six) hours as needed for nausea.   TRAZODONE  (DESYREL ) 50 MG TABLET    Take 1/2-1 tablets (25-50 mg total) by mouth at bedtime as needed for sleep.   VONOPRAZAN FUMARATE  (VOQUEZNA ) 20 MG TABS    Take 1 tablet (20 mg tablet) by mouth daily.  Modified Medications   No medications on file  Discontinued Medications   No medications on file    Allergies: Allergies  Allergen Reactions   Tocilizumab  Shortness Of Breath, Swelling and Other (See Comments)    Swelling and shortness of breath sharpe pains in back and chest tightness. 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   Cerebral vascular disease 01/23/2024   Cervical radicular pain 08/08/2023   CHF (congestive heart failure) (HCC)    Chronic diastolic CHF (congestive heart failure) (HCC) 10/11/2022   Chronic GERD 12/18/2017   Chronic nonintractable headache 09/08/2021   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   Perimenopause 02/27/2019   Peripheral polyneuropathy 01/23/2024   Primary osteoarthritis 04/07/2021   Pulmonary edema    Pulmonary hypertension (HCC) 08/04/2021   Raynaud's disease 11/10/2021   Scleroderma (HCC)    Sjogren's syndrome 08/30/2017   Status post laparoscopic cholecystectomy 02/13/20 02/13/2020   Thyroid  nodule 08/04/2021   Vitamin D  deficiency disease 02/27/2019    Social History: Social History   Socioeconomic History   Marital status: Single    Spouse name: Not on file   Number of children: 0   Years of education: Not on file   Highest education level: Associate degree: occupational, scientist, product/process development, or vocational program  Occupational History   Occupation: CMA    Employer: [employer]   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 She lives alone in one level home, a few steps to enter.CMA [employer].   Social Drivers of Corporate Investment Banker Strain: Low Risk (12/25/2023)   Received from [facility]   Overall Financial Resource Strain (CARDIA)    How hard is it for you to pay for the very basics like food, housing, medical care, and heating?: Not very hard  Food Insecurity: No Food Insecurity (02/20/2024)   Hunger Vital Sign    Worried About Running Out of Food in the Last Year: Never true    Ran Out of Food in the Last Year: Never  true  Transportation Needs: No Transportation Needs (02/20/2024)   PRAPARE - Administrator, Civil Service (Medical): No    Lack of Transportation (Non-Medical): No  Physical Activity: Inactive (12/25/2023)   Received from [facility]   Exercise Vital Sign    On average, how many days per week do you engage in moderate to strenuous exercise (like a brisk walk)?: 0 days    On average, how many minutes do you engage in exercise at this level?: 0 min  Stress: No Stress Concern Present (12/25/2023)   Received from [facility] of Occupational Health - Occupational Stress Questionnaire    Do you feel stress - tense, restless, nervous, or anxious, or unable to sleep at night because your mind is troubled all the time - these days?: Only a little  Social Connections: Socially Integrated (12/25/2023)   Received from [facility]   Social Connection and Isolation Panel    In a typical week, how many times do you talk on the phone with family, friends, or neighbors?: More than three times a week    How often do you get together with friends or relatives?: More than three times a week    How often do you attend church or religious services?: More than 4 times per year    Do you belong to any clubs or organizations such as church groups, unions, fraternal or athletic groups, or school groups?: Yes    How often do you attend meetings of the clubs or organizations you belong to?: 1 to 4 times per year    Are you married, widowed, divorced, separated, never married, or living with a partner?: Living with partner    Medication: Ofev   Plan: - START Ofev  150mg  capsule by mouth twice daily.  - Patient was advised of [facility] number as she normally receives counseling from [place] ahead of shipment of medication.  I discussed the assessment and treatment plan with the patient. The patient was provided an opportunity to ask questions and all were answered. The patient  agreed with the plan and demonstrated an understanding of the instructions.   The patient was advised to call back or seek an in-person evaluation if the symptoms worsen or if the condition fails to improve as anticipated.  Aleck Puls, PharmD, BCPS, CPP Clinical Pharmacist  [facility]  [facility]

## 2024-04-08 ENCOUNTER — Other Ambulatory Visit: Payer: Self-pay

## 2024-04-09 ENCOUNTER — Telehealth: Payer: Self-pay | Admitting: Pharmacist

## 2024-04-09 NOTE — Progress Notes (Unsigned)
 Patient was educated in detail

## 2024-04-09 NOTE — Telephone Encounter (Signed)
 Called patient to schedule an appointment for the [facility]. I was unable to reach the patient so I left a HIPAA-compliant message requesting that the patient return my call.   Herlene Fleeta Morris, PharmD, JAQUELINE, CPP Clinical Pharmacist [facility] [phone]

## 2024-04-10 ENCOUNTER — Ambulatory Visit (INDEPENDENT_AMBULATORY_CARE_PROVIDER_SITE_OTHER)

## 2024-04-10 ENCOUNTER — Telehealth (HOSPITAL_COMMUNITY): Payer: Self-pay

## 2024-04-10 ENCOUNTER — Ambulatory Visit (HOSPITAL_COMMUNITY)

## 2024-04-10 DIAGNOSIS — J849 Interstitial pulmonary disease, unspecified: Secondary | ICD-10-CM

## 2024-04-10 DIAGNOSIS — M349 Systemic sclerosis, unspecified: Secondary | ICD-10-CM

## 2024-04-10 LAB — PULMONARY FUNCTION TEST
DL/VA % pred: 107 %
DL/VA: 4.53 ml/min/mmHg/L
DLCO cor % pred: 42 %
DLCO cor: 9.14 ml/min/mmHg
DLCO unc % pred: 42 %
DLCO unc: 9.05 ml/min/mmHg
FEF 25-75 Post: 2.78 L/s
FEF 25-75 Pre: 2.53 L/s
FEF2575-%Change-Post: 9 %
FEF2575-%Pred-Post: 106 %
FEF2575-%Pred-Pre: 96 %
FEV1-%Change-Post: 0 %
FEV1-%Pred-Post: 46 %
FEV1-%Pred-Pre: 46 %
FEV1-Post: 1.3 L
FEV1-Pre: 1.3 L
FEV1FVC-%Change-Post: 0 %
FEV1FVC-%Pred-Pre: 118 %
FEV6-%Change-Post: 0 %
FEV6-%Pred-Post: 40 %
FEV6-%Pred-Pre: 40 %
FEV6-Post: 1.38 L
FEV6-Pre: 1.39 L
FEV6FVC-%Pred-Post: 103 %
FEV6FVC-%Pred-Pre: 103 %
FVC-%Change-Post: 0 %
FVC-%Pred-Post: 38 %
FVC-%Pred-Pre: 39 %
FVC-Post: 1.38 L
FVC-Pre: 1.39 L
Post FEV1/FVC ratio: 94 %
Post FEV6/FVC ratio: 100 %
Pre FEV1/FVC ratio: 93 %
Pre FEV6/FVC Ratio: 100 %
RV % pred: 76 %
RV: 1.48 L
TLC % pred: 54 %
TLC: 2.86 L

## 2024-04-10 NOTE — Telephone Encounter (Signed)
 No show 1 Patient called front desk concerning missed appointment prior to therapist reaching out. Reports she forgot about appointment. Rescheduled appointment to next Thursday, 04/17/24.   10:58 AM, 04/10/24 Rosaria Settler, PT, DPT [facility]

## 2024-04-10 NOTE — Progress Notes (Signed)
 Full PFT performed today.

## 2024-04-10 NOTE — Patient Instructions (Signed)
 Full PFT performed today.

## 2024-04-14 ENCOUNTER — Other Ambulatory Visit (HOSPITAL_COMMUNITY): Payer: Self-pay

## 2024-04-17 ENCOUNTER — Encounter: Payer: Self-pay | Admitting: Pulmonary Disease

## 2024-04-17 ENCOUNTER — Encounter (HOSPITAL_COMMUNITY): Payer: Self-pay

## 2024-04-17 ENCOUNTER — Ambulatory Visit: Admitting: Nurse Practitioner

## 2024-04-17 ENCOUNTER — Ambulatory Visit (HOSPITAL_COMMUNITY)

## 2024-04-17 ENCOUNTER — Encounter: Payer: Self-pay | Admitting: Nurse Practitioner

## 2024-04-17 ENCOUNTER — Ambulatory Visit: Admitting: Pulmonary Disease

## 2024-04-17 VITALS — BP 122/68 | HR 68 | Ht 64.0 in | Wt 127.0 lb

## 2024-04-17 VITALS — BP 119/84 | HR 93 | Temp 97.7°F | Ht 64.0 in | Wt 127.0 lb

## 2024-04-17 DIAGNOSIS — K3184 Gastroparesis: Secondary | ICD-10-CM | POA: Diagnosis not present

## 2024-04-17 DIAGNOSIS — M349 Systemic sclerosis, unspecified: Secondary | ICD-10-CM | POA: Diagnosis not present

## 2024-04-17 DIAGNOSIS — I272 Pulmonary hypertension, unspecified: Secondary | ICD-10-CM | POA: Diagnosis not present

## 2024-04-17 DIAGNOSIS — K219 Gastro-esophageal reflux disease without esophagitis: Secondary | ICD-10-CM

## 2024-04-17 DIAGNOSIS — J849 Interstitial pulmonary disease, unspecified: Secondary | ICD-10-CM

## 2024-04-17 DIAGNOSIS — I10 Essential (primary) hypertension: Secondary | ICD-10-CM | POA: Diagnosis not present

## 2024-04-17 DIAGNOSIS — Z5181 Encounter for therapeutic drug level monitoring: Secondary | ICD-10-CM

## 2024-04-17 LAB — COMPREHENSIVE METABOLIC PANEL WITH GFR
ALT: 11 U/L (ref 0–35)
AST: 24 U/L (ref 0–37)
Albumin: 4.2 g/dL (ref 3.5–5.2)
Alkaline Phosphatase: 86 U/L (ref 39–117)
BUN: 12 mg/dL (ref 6–23)
CO2: 32 meq/L (ref 19–32)
Calcium: 10.7 mg/dL — ABNORMAL HIGH (ref 8.4–10.5)
Chloride: 99 meq/L (ref 96–112)
Creatinine, Ser: 0.56 mg/dL (ref 0.40–1.20)
GFR: 102.37 mL/min (ref >60.00)
Glucose, Bld: 81 mg/dL (ref 70–99)
Potassium: 3.2 meq/L — ABNORMAL LOW (ref 3.5–5.1)
Sodium: 138 meq/L (ref 135–145)
Total Bilirubin: 0.5 mg/dL (ref 0.2–1.2)
Total Protein: 7.4 g/dL (ref 6.0–8.3)

## 2024-04-17 LAB — CBC WITH DIFFERENTIAL/PLATELET
Basophils Absolute: 0 K/uL (ref 0.0–0.1)
Basophils Relative: 0.5 % (ref 0.0–3.0)
Eosinophils Absolute: 0.2 K/uL (ref 0.0–0.7)
Eosinophils Relative: 2.1 % (ref 0.0–5.0)
HCT: 36.1 % (ref 36.0–46.0)
Hemoglobin: 11.7 g/dL — ABNORMAL LOW (ref 12.0–15.0)
Lymphocytes Relative: 17.9 % (ref 12.0–46.0)
Lymphs Abs: 1.7 K/uL (ref 0.7–4.0)
MCHC: 32.5 g/dL (ref 30.0–36.0)
MCV: 84.5 fl (ref 78.0–100.0)
Monocytes Absolute: 0.7 K/uL (ref 0.1–1.0)
Monocytes Relative: 7.4 % (ref 3.0–12.0)
Neutro Abs: 6.9 K/uL (ref 1.4–7.7)
Neutrophils Relative %: 72.1 % (ref 43.0–77.0)
Platelets: 225 K/uL (ref 150.0–400.0)
RBC: 4.27 Mil/uL (ref 3.87–5.11)
RDW: 13.9 % (ref 11.5–15.5)
WBC: 9.6 K/uL (ref 4.0–10.5)

## 2024-04-17 NOTE — Patient Instructions (Signed)
°  VISIT SUMMARY: Today, we discussed your respiratory symptoms, medication management, and overall health. Your pulmonary hypertension and interstitial lung disease are stable, and your blood pressure is well-controlled with the adjusted hydralazine  regimen. We also addressed your fatigue and gastroesophageal reflux issues.  YOUR PLAN: PULMONARY HYPERTENSION: Your pulmonary hypertension is well-controlled with Opsumit , and your recent echocardiogram shows normal right heart size and function. -Continue taking Opsumit  for pulmonary hypertension. -Follow up in three months to monitor your condition.  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 Lung function tests show stable lung volumes with some variation in diffusion capacity. -Continue current management and monitor for any changes. -Consider participation in a clinical trial for Pulmonix.  HYPERTENSION: Your blood pressure is well-controlled with the adjusted hydralazine  regimen, and your previous fatigue was likely related to overmedication. -Continue taking hydralazine  twice a day.  FATIGUE: You have persistent fatigue, which has improved after reducing the hydralazine  dose. -Monitor your fatigue levels and report any significant changes.  GASTROESOPHAGEAL REFLUX AND ESOPHAGEAL DYSMOTILITY: You have significant acid reflux and esophageal dysmotility, which worsens when not taking Voquenza. -Continue taking Voquenza for acid reflux. -Follow up with gastroenterology for further management.

## 2024-04-17 NOTE — Progress Notes (Signed)
 04/17/2024 Karen [name] [id number] 09/13/1970   Chief Complaint: Acid reflux   History of Present Illness: 53 year-old female with a history of scleroderma, GERD, gastroparesis, chronic constipation, interstitial lung disease on lung transplant list. Previously followed by [facility] as well as [facility]. Established care with Dr. Leigh 05/16/2023.  Due to having persistent reflux symptoms, Protonix  was discontinued and she was started on Voquenza 20 mg daily.  She underwent an EGD 08/02/2023 at [facility] which showed a normal esophagus with no evidence of reflux or Barrett's esophagus and inactive gastritis without evidence of H. pylori.  Her prior dysphagia symptoms were thought to be secondary to scleroderma and he was instructed to continue Voquenza 20 mg daily.  She presents today for follow-up regarding GERD with worsening acid reflux symptoms as she ran out of the Voquenza.  She previously ran out of Voquenza for 2 to 3 months which resulted in worsening reflux symptoms and she was provided samples and her symptoms improved.  She ran out of the Voquenza samples 1 week ago and developed recurrent heartburn and describes feeling that she is not digesting her food.  No nausea or vomiting.  No abdominal pain.  No difficulty swallowing liquids or food but sometimes has difficulty swallowing large pills which is a chronic issue.  She currently awaiting for authorization for Voquenza, patient is insured through [facility].  Authorization was denied until the patient had an office follow-up with documented improvement on this medication.  Patient stated her reflux symptoms, dysphagia and nausea significantly improved when she takes Voquenza consistently.  Food digestion significantly improves when she takes Voquenza and she does not experience any abdominal pain and she passes normal bowel movements when on this medication.  When she is off Voquenza, she develops severe acid reflux  which results in coughing with concerns of nighttime aspiration which potentially exacerbates interstitial lung disease and puts her at risk for aspiration pneumonia.  Prior workup:  EGD September 2019 with erosive reflux esophagitis, patulous GE junction, no dilation and limited exam due to retained food in the stomach.     Colonoscopy 01/02/2019 was normal, repeat in 10 years (2030).    [facility]S 2019 [facility] FINDINGS: Esophageal distention: Normal without mass or stricture Filling defects:  None 12.5 mm barium tablet: Easily passed from oral cavity to stomach without delay Motility: Mild impairment of esophageal motility with incomplete clearance of barium by primary peristaltic waves. Mucosa:  Smooth without irregularity or ulceration Hypopharynx/cervical esophagus: Mild laryngeal penetration without aspiration. Minimal vallecular and piriform sinus residuals. Hiatal hernia:  Absent GE reflux:  Not identified during exam IMPRESSION: Laryngeal penetration without aspiration. Mild esophageal dysmotility without stricture.   GES 2019 FINDINGS: Expected location of the stomach in the left upper quadrant. Ingested meal empties the stomach gradually over the course of the study.  12.4% emptied at 1 hr ( normal >= 10%)  24.2% emptied at 2 hr ( normal >= 40%)  36.1% emptied at 3 hr ( normal >= 70%)  53.8% emptied at 4 hr ( normal >= 90%)     Echo 07/21/21: EF 70-75%,    CT C/A/P 06/06/22: IMPRESSION: 1. CT findings consistent with a small-bowel obstruction. The exact point of obstruction is not identified for certain but appears to be in the midline of the lower abdomen. 2. Stable changes of interstitial lung disease. No acute superimposed pulmonary process. 3. Stable borderline mediastinal and hilar lymph nodes, likely reactive and due to the patient's  underlying lung disease. 4. Bilateral renal calculi but no obstructing ureteral calculi or hydroureteronephrosis. 5. Enlarged  fibroid uterus. 6. Status post cholecystectomy. No biliary dilatation.    CT abdomen / pelvis 10/09/22: IMPRESSION: Changes consistent with partial small bowel obstruction. Transition point appears to lie in the anterior abdomen just to the left of the midline. This is likely related to adhesions.   Remainder of the exam is stable with chronic findings of uterine fibroids and nonobstructing renal calculi.  Aortic Atherosclerosis (ICD10-I70.0).    CT abdomen / pelvis 02/03/23: IMPRESSION: Changes consistent with partial small bowel obstruction in the mid ileum related to transition zone in the anterior abdomen. This is likely related to adhesions.   Uterine fibroid change.   Nonobstructing left renal stone.    EGD 08/02/2023: - Z-line regular.  - 1 cm hiatal hernia.  - Normal esophagus otherwise - no focal stenosis / stricture seen, biopsies taken to rule out EoE. Empiric dilation not performed given residual food in the stomach.  - A medium amount of food (residue) in the stomach.  - Normal stomach otherwise of what was seen - patent pyloric channel and normal cardia.  - Biopsies taken to rule out H pylori although her upper tract symptoms are very likely due to gastroparesis.  - Normal examined duodenum. Dysphagia likely due to scleroderma, and upper tract symptoms due to gastroparesis.   - Continue Voquezna  given significant clinical improvement.  A. GASTRIC, BIOPSY:       Gastric antral / oxyntic mucosa with focal mild chronic inactive  gastritis.      No H. pylori identified on HE stain.       Negative for intestinal metaplasia or dysplasia.   B. ESOPHGEAL, BIOPSY:       Esophageal squamous mucosa with no significant diagnostic  alteration.      No evidence of intraepithelial eosinophils or lymphocytes.   Medications Ordered Prior to Encounter[1] Allergies[2]  Current Medications, Allergies, Past Medical History, Past Surgical History, Family History and Social History  were reviewed in [employer] record.  Review of Systems:   Constitutional: Negative for fever, sweats, chills or weight loss.  Respiratory: Negative for shortness of breath.   Cardiovascular: Negative for chest pain, palpitations and leg swelling.  Gastrointestinal: See HPI.  Musculoskeletal: Negative for back pain or muscle aches.  Neurological: Negative for dizziness, headaches or paresthesias.   Physical Exam: BP 122/68   Pulse 68   Ht 5' 4 (1.626 m)   Wt 127 lb (57.6 kg)   BMI 21.80 kg/m  General: 53 year old female in no acute distress. Head: Normocephalic and atraumatic. Eyes: No scleral icterus. Conjunctiva pink . Ears: Normal auditory acuity. Mouth: Dentition intact. No ulcers or lesions.  Lungs: Breath sounds slightly diminished throughout but clear.  On room air. Heart: Regular rate and rhythm, no murmur. Abdomen: Soft, nontender and nondistended. No masses or hepatomegaly. Normal bowel sounds x 4 quadrants.  Rectal: Furred. Musculoskeletal: Symmetrical with no gross deformities. Extremities: No edema. Neurological: Alert oriented x 4. No focal deficits.  Psychological: Alert and cooperative. Normal mood and affect  Assessment and Recommendations:  53 year old female with chronic acid reflux which was poorly controlled on multiple PPIs including Pantoprazole , subsequently switched Voquenza 20 mg daily 05/2023 and her reflux symptoms and dysphagia significantly improved.  Follow-up EGD 07/2023 showed a normal esophagus without esophagitis on Voquenza (prior EGD 2019 showed erosive esophagitis). Patient ran out of Voquenza and is experiencing significant acid reflux symptoms for the  past week.  - Voquenza 10 mg tablet, take 2 tablets (20mg ) once daily, 20-day supply provided - Await authorization for Voquenza 20 mg 1 tab p.o. daily - Need to write a letter of medical necessity if Voquenza not authorized - GERD diet   Gastroparesis, intolerant to  Reglan  - Continue to eat 3-4 small snack sized meals daily  Scleroderma  Interstitial lung disease         [1]  Current Outpatient Medications on File Prior to Visit  Medication Sig Dispense Refill   acetaminophen  (TYLENOL ) 325 MG tablet Take 2 tablets (650 mg total) by mouth every 6 (six) hours as needed for mild pain, fever or headache (or Fever >/= 101). 12 tablet 0   aspirin  81 MG chewable tablet Chew 1 tablet (81 mg total) by mouth daily. 30 tablet 0   bisoprolol  (ZEBETA ) 5 MG tablet Take 1 tablet (5 mg total) by mouth daily. 90 tablet 1   chlorthalidone  (HYGROTON ) 25 MG tablet Take 1 tablet (25 mg total) by mouth daily. 30 tablet 1   hydrALAZINE  (APRESOLINE ) 50 MG tablet Take 1 tablet (50 mg total) by mouth 3 (three) times daily. 90 tablet 3   macitentan  (OPSUMIT ) 10 MG tablet Take 1 tablet (10 mg total) by mouth daily. 30 tablet 11   mycophenolate  (CELLCEPT ) 200 MG/ML suspension Take 2.5mL (500mg  total) by mouth twice daily for 14 days. THEN take 5mL (1,000mg  total) by mouth twice daily for 14 days. THEN, take 7.70mL (1,500mg ) by mouth twice daily thereafter. 320 mL 0   Nintedanib  (OFEV ) 150 MG CAPS Take 1 capsule (150 mg total) by mouth 2 (two) times daily. 60 capsule 3   NP THYROID  120 MG tablet Take 1 tablet (120 mg total) by mouth daily before breakfast. 90 tablet 0   olmesartan  (BENICAR ) 20 MG tablet Take 1 tablet (20 mg total) by mouth daily. 30 tablet 3   ondansetron  (ZOFRAN ) 4 MG tablet Take 1 tablet (4 mg total) by mouth every 6 (six) hours as needed for nausea. 15 tablet 0   traZODone  (DESYREL ) 50 MG tablet Take 1/2-1 tablets (25-50 mg total) by mouth at bedtime as needed for sleep. 30 tablet 3   Vonoprazan Fumarate  (VOQUEZNA ) 20 MG TABS Take 1 tablet (20 mg tablet) by mouth daily. 30 tablet 1   mycophenolate  (CELLCEPT ) 200 MG/ML suspension Take 7.5 mLs (1,500 mg total) by mouth 2 (two) times daily. (Patient not taking: Reported on 04/17/2024) 480 mL 3   [DISCONTINUED]  PARoxetine  (PAXIL ) 10 MG tablet Take 1 tablet (10 mg total) by mouth daily as directed 60 tablet 2   No current facility-administered medications on file prior to visit.  [2]  Allergies Allergen Reactions   Tocilizumab  Shortness Of Breath, Swelling and Other (See Comments)    Swelling and shortness of breath sharpe pains in back and chest tightness. Vancomycin Infusion Reaction   Lisinopril Hives and Swelling

## 2024-04-17 NOTE — Progress Notes (Signed)
 Agree with assessment / plan as outlined.

## 2024-04-17 NOTE — Patient Instructions (Signed)
 Take Voquenza 10mg  tab two tabs  (20mg ) by mouth once daily, samples provided   Await authorization from her insurance carrier for Voquenza prescription  Eat 3 to 4 snack sized meals daily

## 2024-04-17 NOTE — Progress Notes (Signed)
 Karen [name]    [id number]    [birth year]  Primary Care Physician:Patel, Karen POUR, MD  Referring Physician: Tobie Karen POUR, MD [address] [place],  [place] [address]   Problem list:  Scleroderma ILD with pulmonary HTN CellCept  started May 2019, nintedanib  started December 2020  Pulmonary hypertension-started macitentan  April 2021 Transplant candidate at [facility]  HPI: 53 y.o.  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 Cherrie on 09/27/17 with no evidence of pulmonary hypertension Started on CellCept  May 2019 and uptitrated to max dose of 1.5 mg twice daily.  She had a hospitalization in [facility] in March 2019 for hypertension.  A CT chest scan at that time showed subtle lower lung findings of atelectasis, groundglass.  She has a right thyroid  nodule which was biopsied on 08/15/17 with pathology showing benign findings.  Thyroid  function tests are normal.  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 Finished pulmonary rehab in 2021 and is back at work full-time Repeat right heart cath 2021 by Dr. Cherrie showed mild pulmonary hypertension she has been started on macitetan  Had annual follow-up at [facility] transplant program Oct 2023 when she las pulmonary function test that show a decline in DLCO from 39% to 35%. Reports worse exercise capacity and more fatigue She also had a repeat RHC in April 2023 which showed very mild PAH.  She was started on Actemra  by Dr. Ishmael due to decline in pulmonary function.  She got 2 doses around March and April 2024 but then developed allergic reaction to it which was stopped. Around May 2024 she had a visit with [facility] transplant center with PFTs that actually showed an improvement. Hospitalized in August and October  2025 for altered mental status with TIA, hypertensive emergency.  Her blood pressure medications were adjusted and taken of CellCept  and nintedanib  for unclear reasons.  These medications were restarted in November 2025  Interim history: Discussed the use of AI scribe software for clinical note transcription with the patient, who gave verbal consent to proceed. History of Present Illness Karen [name] is a 53 year old female with scleroderma and pulmonary hypertension who presents for follow-up of her respiratory symptoms and medication management. She is accompanied by her mother.  Dyspnea and pulmonary function - Scleroderma-associated interstitial lung disease and pulmonary hypertension. - Breathing is stable at rest. - Marked shortness of breath with exertion, including work and household activities. - Frequently must stop to catch her breath during activity. - Recent CT chest obtained. - Lung function testing over the past year has shown variable diffusion capacity.  Fatigue - Persistent fatigue, partially attributed to activity level and medications. - Fatigue was worse when taking hydralazine  three times daily. - Fatigue improved after hydralazine  dose reduced to twice daily. - Blood pressure improved with hydralazine  dose adjustment.  Gastroesophageal reflux and esophageal dysmotility - Significant acid reflux and esophageal dysmotility. - Reflux becomes severe when not taking Voquenza; recently ran out of medication. - Plans to see gastroenterology today for further management.  Medication management and adverse reactions - Currently taking Opsumit  for pulmonary hypertension. - Resumed CellCept  and nintedanib  after prior interruption.  Functional status - Continues to work full-time but finds this increasingly difficult due to respiratory symptoms and fatigue. - Desires to remain active despite limitations.  Relevant pulmonary history Pets: Dog, no cats,  birds Occupation: Works as a CLINICAL BIOCHEMIST in cardiology clinic Exposures: Has crawlspace but no dampness.  No mold. No hot tub, Jacuzzi. She has down comforter.  Smoking history: Never smoker Travel history: Grew up in [place] .  Lived in [place]  and [place]  Relevant family history: No family history of lung disease.   Outpatient Encounter Medications as of 04/17/2024  Medication Sig   acetaminophen  (TYLENOL ) 325 MG tablet Take 2 tablets (650 mg total) by mouth every 6 (six) hours as needed for mild pain, fever or headache (or Fever >/= 101).   aspirin  81 MG chewable tablet Chew 1 tablet (81 mg total) by mouth daily.   bisoprolol  (ZEBETA ) 5 MG tablet Take 1 tablet (5 mg total) by mouth daily.   chlorthalidone  (HYGROTON ) 25 MG tablet Take 1 tablet (25 mg total) by mouth daily.   hydrALAZINE  (APRESOLINE ) 50 MG tablet Take 1 tablet (50 mg total) by mouth 3 (three) times daily.   macitentan  (OPSUMIT ) 10 MG tablet Take 1 tablet (10 mg total) by mouth daily.   mycophenolate  (CELLCEPT ) 200 MG/ML suspension Take 2.63mL (500mg  total) by mouth twice daily for 14 days. THEN take 5mL (1,000mg  total) by mouth twice daily for 14 days. THEN, take 7.83mL (1,500mg ) by mouth twice daily thereafter.   mycophenolate  (CELLCEPT ) 200 MG/ML suspension Take 7.5 mLs (1,500 mg total) by mouth 2 (two) times daily.   Nintedanib  (OFEV ) 150 MG CAPS Take 1 capsule (150 mg total) by mouth 2 (two) times daily.   NP THYROID  120 MG tablet Take 1 tablet (120 mg total) by mouth daily before breakfast.   olmesartan  (BENICAR ) 20 MG tablet Take 1 tablet (20 mg total) by mouth daily.   ondansetron  (ZOFRAN ) 4 MG tablet Take 1 tablet (4 mg total) by mouth every 6 (six) hours as needed for nausea.   traZODone  (DESYREL ) 50 MG tablet Take 1/2-1 tablets (25-50 mg total) by mouth at bedtime as needed for sleep.   Vonoprazan Fumarate  (VOQUEZNA ) 20 MG TABS Take 1 tablet (20 mg tablet) by mouth daily.   [DISCONTINUED] PARoxetine  (PAXIL )  10 MG tablet Take 1 tablet (10 mg total) by mouth daily as directed   No facility-administered encounter medications on file as of 04/17/2024.   Vitals:   04/17/24 1132  BP: 119/84  Pulse: 93  Temp: 97.7 F (36.5 C)  Height: 5' 4 (1.626 m)  Weight: 127 lb (57.6 kg)  TempSrc: Oral  BMI (Calculated): 21.79    Physical Exam VITALS: BP- 119/84 GEN: No acute distress CV: Regular rate and rhythm no murmurs LUNGS: Clear to auscultation bilaterally normal respiratory effort SKIN JOINTS: Warm and dry no rash    Data Reviewed: Imaging CT chest, [facility] 07/24/2017 Dependent atelectasis, subtle groundglass opacities at the base.  2.5 cm right thyroid  nodule.  I have reviewed the images personally  CT high-resolution 09/18/2017- patchy reticular groundglass opacities, reticulation with subpleural sparing.  Mild traction bronchiectasis.  No honeycombing.  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  Aortic atherosclerosis, three-vessel coronary artery disease.  CT high-resolution 06/27/2018-stable interstitial lung disease.  CT high-resolution 06/11/2019-stable interstitial lung disease  CT high-resolution 05/19/2020-stable interstitial lung disease  CT high-resolution 11/10/2021-interstitial lung disease with slight progression compared to before  CT high-resolution 03/22/2023-stable interstitial lung disease.  subtle increase in centrilobular nodularity  CT high-resolution 03/20/2024-stable pattern of interstitial lung disease.  Indeterminate for UIP.  Enlarged pulmonary  trunk, coronary artery calcification I have reviewed the images personally.  PFTs FENO 09/06/2017-unable to complete  07/27/2017 FVC 1.65 (54%), FEV1 1.53 (62%], F/F 93, TLC 50, DLCO 44%, DLCO/VA 131%  10/25/2017 FVC 1.51 [50%], FEV1 1.26 [51%], F/F 83  01/29/2018 FVC 1.69 [5%), FEV1 1.50 [61%], F/F 89, DLCO 10.57 [41%]  07/01/2018 FVC  1.57 [52%), FEV1 1.47 [60%], F/F 93, DLCO 12.54 [59%)  01/06/2019 FVC 1.58 [52%], FEV1 1.46 [60%], F/F 92, TLC 2.62 [50%], DLCO 9.16 [42%] Severe restriction and diffusion impairment.  FVC is stable but DLCO has worsened.  6-minute walk  10/23/2017- 144 m Post walk heart rate, stats 94, 91%  02/04/2018- 249 m Post walk heart rate, sats 101, 99%  06/06/2018-212 m Post walk heart rate, sats 86, 92%  01/30/2019-11m Post walk heart rate, sats 83, 98%  01/13/21-238 m  Labs 08/20/2017-ANA, centromere, rheumatoid factor, SCL 70-negative QuantiFERON 09/12/2017-negative Hepatitis B, C screening 09/21/2017-negative G6PD 09/25/2017-13  Labs from Dr. Ishmael 07/12/17 Rheumatoid factor < 10, CCP-8, ANCA-negative, double-stranded DNA-negative, Smith antibody-negative, SCL 70-negative, RNP-negative SSA 5.8, SSB 1.5 Smith antibody-negative, ANA IFA-negative Myositis panel-negative CK 165, C4-30, C3-168, total complement greater than 60, aldolase 8.8 Sed rate 9, CRP 0.9  CBC, hepatic panel 09/16/2020-within normal limits  Cardiac Cardiac cath 09/05/2019 PA = 38/16 (25) PCW = 6 Fick cardiac output/index = 5.5/3.4 PVR = 3.4 WU Mild pulmonary hypertension  Cardiac cath 08/16/2021 PA = 36/14 (23) PCW = 3 Fick cardiac output/index = 7.3/4.4 PVR = 2.7  Overnight oximetry 01/30/2019- test time 8 hours 25 minutes Lowest O2 sat 87%.  Mean O2 sat 95%.  Time spent less than 88% - 1 minute 30 seconds No significant desaturation.  Does not need supplemental oxygen .  Assessment & Plan Interstitial lung disease due to scleroderma Interstitial lung disease is stable with no significant progression on recent CT scan. Lung function tests show stable lung volumes with some variation in diffusion capacity.  She has had an interruption in CellCept  and prednisone  but is now back on therapy - Continue current management and monitor for any changes - Provided information on for potential clinical trial  participation  Pulmonary hypertension Well-controlled with Opsumit . Recent echocardiogram shows normal right heart size and function with an ejection fraction of 65-70%. No significant changes in symptoms - Continue macitentan  for pulmonary hypertension - Has follow-up with Dr. Bensimhon later this month  Hypertension Well-controlled with adjusted hydralazine  regimen. Blood pressure is stable at 119/84 mmHg. Previous fatigue likely related to overmedication with hydralazine . - Continue current hydralazine  regimen at two times a day  Dysphagia Esophageal dysfunction, esophageal stricture, GERD Follows with GI.    Plan/Recommendations: Continue CellCept , nintedanib  Labs for monitoring  I personally spent a total of 40 minutes in the care of the patient today including preparing to see the patient, getting/reviewing separately obtained history, referring and communicating with other health care professionals, documenting clinical information in the EHR, independently interpreting results, communicating results, and coordinating care.   Arleny Kruger MD [place] 04/17/2024, 11:37 AM

## 2024-04-18 ENCOUNTER — Telehealth: Payer: Self-pay

## 2024-04-18 ENCOUNTER — Other Ambulatory Visit (HOSPITAL_COMMUNITY): Payer: Self-pay

## 2024-04-18 NOTE — Telephone Encounter (Signed)
 Pharmacy Patient Advocate Encounter   Received notification from Pt Calls Messages that prior authorization for Voquezna  20MG  tablets is required/requested.   Insurance verification completed.   The patient is insured through [facility].   Per test claim: PA required; PA submitted to above mentioned insurance via Latent Key/confirmation #/EOC [id number] Status is pending

## 2024-04-21 ENCOUNTER — Ambulatory Visit: Payer: Self-pay

## 2024-04-21 ENCOUNTER — Other Ambulatory Visit (HOSPITAL_BASED_OUTPATIENT_CLINIC_OR_DEPARTMENT_OTHER): Payer: Self-pay

## 2024-04-21 MED ORDER — AMOXICILLIN 400 MG/5ML PO SUSR
Freq: Two times a day (BID) | ORAL | 0 refills | Status: DC
  Filled 2024-04-21: qty 200, 7d supply, fill #0

## 2024-04-21 NOTE — Telephone Encounter (Signed)
 FYI Only or Action Required?: FYI only for provider: ED advised.  Patient was last seen in primary care on 02/28/2024 by Tobie Suzzane POUR, MD.  Called Nurse Triage reporting Chest Pain.  Symptoms began yesterday.  Triage Disposition: Go to ED Now (Notify PCP)  Patient/caregiver understands and will follow disposition?: Yes                Copied from CRM #[id number]. Topic: Clinical - Red Word Triage >> Apr 21, 2024 11:13 AM Victoria A wrote: [facility] that prompted transfer to Nurse Triage: Patient is having tightness in chest, headaches since last night >> Apr 21, 2024 11:55 AM Myrick T wrote: Patient called back stated she was holding for a nurse for almost then  line disconnected Reason for Disposition  [1] Chest pain (or angina) comes and goes AND [2] is happening more often (increasing in frequency) or getting worse (increasing in severity)  (Exception: Chest pains that last only a few seconds.)  Answer Assessment - Initial Assessment Questions Chest tightness and headache since last night Head is hurting real bad When breathing tightness in chest, 4-5/10 pain level Pain around nose and eyes SOB, I'm always short of breath, about the same  Denies pain  Protocols used: Chest Pain-A-AH

## 2024-04-21 NOTE — Telephone Encounter (Signed)
 Noted patient advised ED

## 2024-04-22 NOTE — Telephone Encounter (Signed)
 Pharmacy Patient Advocate Encounter  Received notification from [employer] that Prior Authorization for Voquezna  20MG  tablets has been DENIED.  Full denial letter will be uploaded to the media tab. See denial reason below.  The reason(s) for this determination is (are):   Based on the information sent for review, the requested drug did not meet our guideline rules. To get the request approved, your doctor needs to show that you have met the guideline rules below.   When used for the treatment of non-erosive GERD, our guideline named VONOPRAZAN (reviewed for Voquezna  20mg  tablets) only allows for approval of Voquezna  10mg  tablets, which is the strength that is approved by the Food and Drug Administration (FDA) for the treatment of your condition.   Based on the information we have received, you do meet all of our guideline requirements for approval of Voquezna  10mg  tablets. A prior authorization has been approved for Voquezna  10mg  tablets for four (4) weeks with a quantity limit of one (1) tablet per day.   PA #/Case ID/Reference #: [id number]

## 2024-04-23 NOTE — Telephone Encounter (Signed)
 I would try her on Voquezna  10mg  / day for now and see how she does. Will this be covered by longer than 4 weeks? It is going to be a long term medication. She has scleroderma and has failed other PPIs. If Voquezna  20mg  / day dose is the only thing that has worked for her, we would otherwise need to write her a letter of medical necessity.

## 2024-04-23 NOTE — Telephone Encounter (Signed)
 Genna - got it - if that is the case is our only option a letter of medical necessity? If so we should proceed with the letter then

## 2024-04-23 NOTE — Telephone Encounter (Signed)
 At the moment, the medication was approved for the 10mg . Based on the denial, they won't cover for the 20mg  for her diagnosis. I'm not sure if a letter will change this decision but it is something we can pursue if the 10mg  does not help. If the 10mg  is helpful, we can attempt another authorization request as it gets closer to the expiration date of this approval. It does not specify if 4 weeks is all they will cover or if it is only 4 weeks at a time.

## 2024-04-23 NOTE — Telephone Encounter (Signed)
 Dottie, pls refer to Dr. Hassan response below and initiate Voquenza 10mg  once daily.

## 2024-04-23 NOTE — Telephone Encounter (Signed)
 Prior Auth team- Can you help with this?

## 2024-04-23 NOTE — Telephone Encounter (Signed)
 Left message for patient to call back

## 2024-04-24 ENCOUNTER — Other Ambulatory Visit (HOSPITAL_COMMUNITY): Payer: Self-pay

## 2024-04-24 ENCOUNTER — Encounter (HOSPITAL_COMMUNITY): Payer: Self-pay | Admitting: Internal Medicine

## 2024-04-24 ENCOUNTER — Ambulatory Visit (HOSPITAL_COMMUNITY): Attending: Internal Medicine | Admitting: Physical Therapy

## 2024-04-24 ENCOUNTER — Ambulatory Visit (HOSPITAL_BASED_OUTPATIENT_CLINIC_OR_DEPARTMENT_OTHER)
Admission: RE | Admit: 2024-04-24 | Discharge: 2024-04-24 | Disposition: A | Discharge status: Home / Self Care | Source: Ambulatory Visit | Attending: Internal Medicine | Admitting: Internal Medicine

## 2024-04-24 ENCOUNTER — Telehealth: Payer: Self-pay

## 2024-04-24 ENCOUNTER — Telehealth (HOSPITAL_COMMUNITY): Payer: Self-pay

## 2024-04-24 VITALS — BP 120/86 | HR 91 | Ht 64.0 in | Wt 127.4 lb

## 2024-04-24 DIAGNOSIS — J8410 Pulmonary fibrosis, unspecified: Secondary | ICD-10-CM | POA: Insufficient documentation

## 2024-04-24 DIAGNOSIS — Z79899 Other long term (current) drug therapy: Secondary | ICD-10-CM | POA: Insufficient documentation

## 2024-04-24 DIAGNOSIS — M6281 Muscle weakness (generalized): Secondary | ICD-10-CM | POA: Diagnosis present

## 2024-04-24 DIAGNOSIS — Z7682 Awaiting organ transplant status: Secondary | ICD-10-CM | POA: Insufficient documentation

## 2024-04-24 DIAGNOSIS — I110 Hypertensive heart disease with heart failure: Secondary | ICD-10-CM | POA: Insufficient documentation

## 2024-04-24 DIAGNOSIS — I272 Pulmonary hypertension, unspecified: Secondary | ICD-10-CM | POA: Diagnosis not present

## 2024-04-24 DIAGNOSIS — Z7409 Other reduced mobility: Secondary | ICD-10-CM | POA: Insufficient documentation

## 2024-04-24 DIAGNOSIS — Z7982 Long term (current) use of aspirin: Secondary | ICD-10-CM | POA: Insufficient documentation

## 2024-04-24 DIAGNOSIS — R2689 Other abnormalities of gait and mobility: Secondary | ICD-10-CM | POA: Insufficient documentation

## 2024-04-24 DIAGNOSIS — I10 Essential (primary) hypertension: Secondary | ICD-10-CM | POA: Diagnosis not present

## 2024-04-24 DIAGNOSIS — Z79890 Hormone replacement therapy (postmenopausal): Secondary | ICD-10-CM | POA: Insufficient documentation

## 2024-04-24 DIAGNOSIS — Z79624 Long term (current) use of inhibitors of nucleotide synthesis: Secondary | ICD-10-CM | POA: Insufficient documentation

## 2024-04-24 DIAGNOSIS — I5032 Chronic diastolic (congestive) heart failure: Secondary | ICD-10-CM | POA: Insufficient documentation

## 2024-04-24 DIAGNOSIS — M349 Systemic sclerosis, unspecified: Secondary | ICD-10-CM | POA: Diagnosis not present

## 2024-04-24 DIAGNOSIS — Z8249 Family history of ischemic heart disease and other diseases of the circulatory system: Secondary | ICD-10-CM | POA: Insufficient documentation

## 2024-04-24 MED ORDER — VOQUEZNA 10 MG PO TABS
1.0000 | ORAL_TABLET | Freq: Every day | ORAL | 2 refills | Status: AC
  Filled 2024-04-24 – 2024-06-05 (×3): qty 30, 30d supply, fill #0
  Filled 2024-06-05: qty 30, 30d supply, fill #1
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
## 2024-04-24 NOTE — Telephone Encounter (Signed)
 I have spoken to patient to advise that insurance will not approve Voquezna  at 20 mg daily. Rather, they will cover 10 mg once daily for 4 weeks. Patient is advised we will send a prescription for 10 mg tablets to her pharmacy and she should let us  know if this dosage works okay for her. If so, we will try to keep her on this dosage longer term depending on insurance kickback after that 4 week maximum time frame.
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
## 2024-04-24 NOTE — Patient Instructions (Signed)
 Good to see you today!   Your physician has requested that you have an echocardiogram. Echocardiography is a painless test that uses sound waves to create images of your heart. It provides your doctor with information about the size and shape of your heart and how well your hearts chambers and valves are working. This procedure takes approximately one hour. There are no restrictions for this procedure. Please do NOT wear cologne, perfume, aftershave, or lotions (deodorant is allowed). Please arrive 15 minutes prior to your appointment time.  Please note: We ask at that you not bring children with you during ultrasound (echo/ vascular) testing. Due to room size and safety concerns, children are not allowed in the ultrasound rooms during exams. Our front office staff cannot provide observation of children in our lobby area while testing is being conducted. An adult accompanying a patient to their appointment will only be allowed in the ultrasound room at the discretion of the ultrasound technician under special circumstances. We apologize for any inconvenience.  Your physician recommends that you schedule a follow-up appointment  1 year with  echo(December 2026) Call office in October to schedule an appointment  If you have any questions or concerns before your next appointment please send us  a message through [place] or call our office at [phone].    TO LEAVE A MESSAGE FOR THE NURSE SELECT OPTION 2, PLEASE LEAVE A MESSAGE INCLUDING: YOUR NAME DATE OF BIRTH CALL BACK NUMBER REASON FOR CALL**this is important as we prioritize the call backs  YOU WILL RECEIVE A CALL BACK THE SAME DAY AS LONG AS YOU CALL BEFORE 4:00 PM At the [facility], you and your health needs are our priority. As part of our continuing mission to provide you with exceptional heart care, we have created designated Provider Care Teams. These Care Teams include your primary Cardiologist (physician) and  Advanced Practice Providers (APPs- Physician Assistants and Nurse Practitioners) who all work together to provide you with the care you need, when you need it.   You may see any of the following providers on your designated Care Team at your next follow up: Dr Toribio Fuel Dr Ezra Shuck Dr. Morene Brownie Greig Mosses, NP Caffie Shed, [place] [facility] [place], [place] Beckey Coe, NP Jordan Lee, NP Ellouise Class, NP Tinnie Redman, PharmD Jaun Bash, PharmD   Please be sure to bring in all your medications bottles to every appointment.    Thank you for choosing [facility]

## 2024-04-24 NOTE — Addendum Note (Signed)
 Encounter addended by: Eryx Zane M, RN on: 04/24/2024 10:55 AM  Actions taken: Order list changed, Diagnosis association updated, Clinical Note Signed

## 2024-04-24 NOTE — Telephone Encounter (Incomplete)
 Last contact with PharmD ***

## 2024-04-25 ENCOUNTER — Other Ambulatory Visit (HOSPITAL_COMMUNITY): Payer: Self-pay

## 2024-04-25 ENCOUNTER — Telehealth: Payer: Self-pay

## 2024-04-25 NOTE — Telephone Encounter (Signed)
 Pharmacy Patient Advocate Encounter   Received notification from Pt Calls Messages that prior authorization for Voquezna  10MG  tablets is required/requested.   Insurance verification completed.   The patient is insured through [facility].   Prior Authorization for Voquezna  10MG  tablets has been APPROVED from 04-25-2024 to 05-23-2024

## 2024-04-25 NOTE — Telephone Encounter (Signed)
 Contacted insurance as there is suppose to already be approval for the 10mg  tablets. Instructed to call back in 3 hours to check on status update.

## 2024-04-25 NOTE — Telephone Encounter (Signed)
 PA approved.

## 2024-04-28 ENCOUNTER — Other Ambulatory Visit (HOSPITAL_COMMUNITY): Payer: Self-pay

## 2024-04-28 ENCOUNTER — Other Ambulatory Visit: Payer: Self-pay

## 2024-04-29 ENCOUNTER — Other Ambulatory Visit: Payer: Self-pay

## 2024-05-02 ENCOUNTER — Other Ambulatory Visit (HOSPITAL_COMMUNITY): Payer: Self-pay

## 2024-05-05 ENCOUNTER — Other Ambulatory Visit (HOSPITAL_COMMUNITY): Payer: Self-pay

## 2024-05-06 ENCOUNTER — Other Ambulatory Visit (HOSPITAL_COMMUNITY): Payer: Self-pay

## 2024-05-07 ENCOUNTER — Other Ambulatory Visit: Payer: Self-pay

## 2024-05-09 ENCOUNTER — Other Ambulatory Visit: Payer: Self-pay

## 2024-05-15 ENCOUNTER — Ambulatory Visit (HOSPITAL_COMMUNITY)

## 2024-05-29 ENCOUNTER — Ambulatory Visit: Admitting: Podiatry

## 2024-05-29 ENCOUNTER — Ambulatory Visit

## 2024-05-29 DIAGNOSIS — L600 Ingrowing nail: Secondary | ICD-10-CM

## 2024-05-29 DIAGNOSIS — M2041 Other hammer toe(s) (acquired), right foot: Secondary | ICD-10-CM | POA: Diagnosis not present

## 2024-05-29 DIAGNOSIS — D2371 Other benign neoplasm of skin of right lower limb, including hip: Secondary | ICD-10-CM

## 2024-05-29 DIAGNOSIS — T847XXA Infection and inflammatory reaction due to other internal orthopedic prosthetic devices, implants and grafts, initial encounter: Secondary | ICD-10-CM | POA: Diagnosis not present

## 2024-05-29 NOTE — Patient Instructions (Signed)

## 2024-06-02 NOTE — Progress Notes (Signed)
 "  Subjective:  Patient ID: Karen [name], female    DOB: 1971/05/03,  MRN: [id number] HPI Chief Complaint  Patient presents with   Callouses    RT 4th is painful, has intermittent swelling. Ongoing since previous surgery with Dr. Magdalen back in 2020/2021. Callous is located between 4th & 5th submet base. Not diabetic, no anticoag.     54 y.o. female presents with the above complaint.   ROS: Denies fever chills nausea vomiting muscle aches pains calf pain back pain chest pain shortness of breath.  States that she is having severe pain medial aspect of the fourth nail plate.  She states that his toes just hurt to me all the time.  She also states that she hurts beneath the fifth metatarsal head and can hardly walk on it.  Has had surgery there previously.  Is also having pain to the first metatarsal of the same foot.  She has painful calluses her right plantar foot.    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   Cerebral vascular disease 01/23/2024   Cervical radicular pain 08/08/2023   CHF (congestive heart failure) (HCC)    Chronic diastolic CHF (congestive heart failure) (HCC) 10/11/2022   Chronic GERD 12/18/2017   Chronic nonintractable headache 09/08/2021   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   Delayed gastric emptying 11/10/2021   Dermatofibroma 03/25/2020   Gastroparesis    GERD (gastroesophageal reflux disease)    History of TIA (transient ischemic attack) 12/28/2023   Hot flashes due to menopause 02/27/2019   HTN (hypertension) 03/23/2017   Hypothyroidism    Hypothyroidism, adult 08/04/2021   ILD (interstitial lung disease) (HCC) 02/08/2018   Left hand  paresthesia 01/23/2024   Multinodular goiter 08/30/2017   Multinodular thyroid     benign FNA 08/2017.   Perimenopause 02/27/2019   Peripheral polyneuropathy 01/23/2024   Primary osteoarthritis 04/07/2021   Pulmonary edema    Pulmonary hypertension (HCC) 08/04/2021   Raynaud's disease 11/10/2021   Scleroderma (HCC)    Sjogren's syndrome 08/30/2017   Status post laparoscopic cholecystectomy 02/13/20 02/13/2020   Thyroid  nodule 08/04/2021   Vitamin D  deficiency disease 02/27/2019   Past Surgical History:  Procedure Laterality Date   ABDOMINAL HERNIA REPAIR     BIOPSY OF SKIN SUBCUTANEOUS TISSUE AND/OR MUCOUS MEMBRANE  08/02/2023   Procedure: BIOPSY, SKIN, SUBCUTANEOUS TISSUE, OR MUCOUS MEMBRANE;  Surgeon: Leigh Elspeth SQUIBB, MD;  Location: [facility];  Service: Gastroenterology;;   ROMAYNE Bilateral 10/2018   CHOLECYSTECTOMY N/A 02/13/2020   Procedure: LAPAROSCOPIC CHOLECYSTECTOMY;  Surgeon: Mavis Anes, MD;  Location: [facility];  Service: General;  Laterality: N/A;   COLONOSCOPY WITH PROPOFOL  N/A 01/02/2019   Procedure: COLONOSCOPY WITH PROPOFOL ;  Surgeon: Shaaron Lamar HERO, MD;  Location: [facility];  Service: Endoscopy;  Laterality: N/A;  12:30pm   ESOPHAGOGASTRODUODENOSCOPY (EGD) WITH PROPOFOL  N/A 01/28/2018   erosive reflux esophagitis, patulous EG Junction, no dilation, incomplete EGD due to retained food in stomach. GES thereafter with delayed gastric emptying.    ESOPHAGOGASTRODUODENOSCOPY (EGD) WITH PROPOFOL  N/A 08/02/2023   Procedure: ESOPHAGOGASTRODUODENOSCOPY (EGD) WITH PROPOFOL ;  Surgeon: Leigh Elspeth SQUIBB, MD;  Location: [facility];  Service: Gastroenterology;  Laterality: N/A;   RIGHT HEART CATH N/A 09/27/2017   Procedure: RIGHT HEART  CATH;  Surgeon: Cherrie Toribio SAUNDERS, MD;  Location: [facility];  Service: Cardiovascular;  Laterality: N/A;   RIGHT HEART CATH N/A 09/05/2019   Procedure: RIGHT HEART CATH;  Surgeon: Cherrie Toribio SAUNDERS, MD;  Location: [facility]  INVASIVE CV LAB;  Service: Cardiovascular;  Laterality: N/A;   RIGHT/LEFT HEART CATH AND CORONARY ANGIOGRAPHY N/A 08/18/2021   Procedure: RIGHT/LEFT HEART CATH AND CORONARY ANGIOGRAPHY;  Surgeon: Cherrie Toribio SAUNDERS, MD;  Location: [facility]VAS[facility] CV LAB;  Service: Cardiovascular;  Laterality: N/A;   UTERINE FIBROID SURGERY     Current Medications[1]  Allergies[2] Review of Systems Objective:  There were no vitals filed for this visit.  General: Well developed, nourished, in no acute distress, alert and oriented x3   Dermatological: Skin is warm, dry and supple bilateral. Nails x 10 are well maintained; remaining integument appears unremarkable at this time. There are no open sores, no preulcerative lesions, no rash or signs of infection present.  Vascular: Dorsalis Pedis artery and Posterior Tibial artery pedal pulses are 2/4 bilateral with immedate capillary fill time. Pedal hair growth present. No varicosities and no lower extremity edema present bilateral.   Neruologic: Grossly intact via light touch bilateral. Vibratory intact via tuning fork bilateral. Protective threshold with Semmes Wienstein monofilament intact to all pedal sites bilateral. Patellar and Achilles deep tendon reflexes 2+ bilateral. No Babinski or clonus noted bilateral.   Musculoskeletal: No gross boney pedal deformities bilateral. No pain, crepitus, or limitation noted with foot and ankle range of motion bilateral. Muscular strength 5/5 in all groups tested bilateral.  Gait: Unassisted, Nonantalgic.    Radiographs:  Radiographs taken today demonstrate a displaced very long screw to the fifth metatarsal of the right foot I can see where plantarflexion of this joint would result in severe pain as well as ambulation.  This is easily palpated on physical exam.  2 K wires to the first metatarsal have turned and are now sticking straight up causing pain on palpation as well.  The anatomy of the foot otherwise appears to be  good.  Assessment & Plan:   Assessment: Painful ingrown toenail tibial border fourth digit right painful internal fixation 1st and 5th metatarsals.  Plan: Ingrown toenail tibial border fourth digit right surgically removed today after local anesthetic was administered the nail was prepped and draped and the medial border was resected.  This exposed the matrix which was then destroyed with 3 applications of phenol and neutralized with isopropyl alcohol.  Silvadene cream Telfa pad and dressed a compressive dressing was applied was given both all written restriction for the care and soaking of the toe.  Also prescribed a Corticosporin otic which will be applied twice daily after soaking.  I did recommend that she consider having this internal fixation removed and I debrided her benign lesion for her today.  Follow-up with her in 2 weeks     Uchechi Denison T. Adley Mazurowski, DPM    [1]  Current Outpatient Medications:    acetaminophen  (TYLENOL ) 325 MG tablet, Take 2 tablets (650 mg total) by mouth every 6 (six) hours as needed for mild pain, fever or headache (or Fever >/= 101)., Disp: 12 tablet, Rfl: 0   aspirin  81 MG chewable tablet, Chew 1 tablet (81 mg total) by mouth daily., Disp: 30 tablet, Rfl: 0   bisoprolol  (ZEBETA ) 5 MG tablet, Take 1 tablet (5 mg total) by mouth daily., Disp: 90 tablet, Rfl: 1   chlorthalidone  (HYGROTON ) 25 MG tablet, Take 1 tablet (25 mg total)  by mouth daily., Disp: 30 tablet, Rfl: 1   hydrALAZINE  (APRESOLINE ) 50 MG tablet, Take 1 tablet (50 mg total) by mouth 3 (three) times daily., Disp: 90 tablet, Rfl: 3   macitentan  (OPSUMIT ) 10 MG tablet, Take 1 tablet (10 mg total) by mouth daily., Disp: 30 tablet, Rfl: 11   mycophenolate  (CELLCEPT ) 200 MG/ML suspension, Take 2.5mL (500mg  total) by mouth twice daily for 14 days. THEN take 5mL (1,000mg  total) by mouth twice daily for 14 days. THEN, take 7.68mL (1,500mg ) by mouth twice daily thereafter., Disp: 320 mL, Rfl: 0   Nintedanib  (OFEV ) 150 MG  CAPS, Take 1 capsule (150 mg total) by mouth 2 (two) times daily., Disp: 60 capsule, Rfl: 3   NP THYROID  120 MG tablet, Take 1 tablet (120 mg total) by mouth daily before breakfast., Disp: 90 tablet, Rfl: 0   olmesartan  (BENICAR ) 20 MG tablet, Take 1 tablet (20 mg total) by mouth daily., Disp: 30 tablet, Rfl: 3   ondansetron  (ZOFRAN ) 4 MG tablet, Take 1 tablet (4 mg total) by mouth every 6 (six) hours as needed for nausea., Disp: 15 tablet, Rfl: 0   traZODone  (DESYREL ) 50 MG tablet, Take 1/2-1 tablets (25-50 mg total) by mouth at bedtime as needed for sleep., Disp: 30 tablet, Rfl: 3   Vonoprazan Fumarate  (VOQUEZNA ) 10 MG TABS, Take 1 tablet by mouth daily., Disp: 30 tablet, Rfl: 2 [2]  Allergies Allergen Reactions   Tocilizumab  Shortness Of Breath, Swelling and Other (See Comments)    Swelling and shortness of breath sharpe pains in back and chest tightness. Vancomycin Infusion Reaction   Lisinopril Hives, Swelling and Other (See Comments)   "

## 2024-06-05 ENCOUNTER — Other Ambulatory Visit (HOSPITAL_BASED_OUTPATIENT_CLINIC_OR_DEPARTMENT_OTHER): Payer: Self-pay

## 2024-06-05 ENCOUNTER — Other Ambulatory Visit: Payer: Self-pay | Admitting: Internal Medicine

## 2024-06-05 ENCOUNTER — Encounter: Payer: Self-pay | Admitting: Pharmacist

## 2024-06-05 ENCOUNTER — Other Ambulatory Visit: Payer: Self-pay

## 2024-06-05 ENCOUNTER — Telehealth (HOSPITAL_COMMUNITY): Payer: Self-pay

## 2024-06-05 ENCOUNTER — Other Ambulatory Visit (HOSPITAL_COMMUNITY): Payer: Self-pay

## 2024-06-05 DIAGNOSIS — E039 Hypothyroidism, unspecified: Secondary | ICD-10-CM

## 2024-06-05 MED ORDER — NP THYROID 120 MG PO TABS
120.0000 mg | ORAL_TABLET | Freq: Every day | ORAL | 0 refills | Status: AC
  Filled 2024-06-05 (×2): qty 90, 90d supply, fill #0

## 2024-06-05 NOTE — Telephone Encounter (Signed)
 Where is this request coming from? [facility]  Medication: Voquezna  10mg  Prior authorization required? Yes If YES, on primary or secondary insurance? Primary Comments:

## 2024-06-05 NOTE — Telephone Encounter (Signed)
 PA request has been Received. New Encounter has been or will be created for follow up. For additional info see Pharmacy Prior Auth telephone encounter from 06/05/24.

## 2024-06-05 NOTE — Telephone Encounter (Signed)
 Pharmacy Patient Advocate Encounter   Received notification from Pt Calls Messages that prior authorization for NP Thyroid  120MG  tablets  is required/requested.   Insurance verification completed.   The patient is insured through [facility].   Per test claim: PA required; PA submitted to above mentioned insurance via Latent Key/confirmation #/EOC [id number] Status is pending

## 2024-06-05 NOTE — Telephone Encounter (Signed)
 Where is this request coming from? Karen [name]  Medication: NP Thyroid  120mg  Prior authorization required? Yes If YES, on primary or secondary insurance? Primary Comments:

## 2024-06-06 ENCOUNTER — Telehealth: Payer: Self-pay

## 2024-06-06 ENCOUNTER — Other Ambulatory Visit: Payer: Self-pay

## 2024-06-06 ENCOUNTER — Other Ambulatory Visit (HOSPITAL_COMMUNITY): Payer: Self-pay

## 2024-06-06 NOTE — Telephone Encounter (Signed)
 Pharmacy Patient Advocate Encounter   Received notification from Pt Calls Messages that prior authorization for Voquezna  10MG  tablets is required/requested.   Insurance verification completed.   The patient is insured through [facility].    Prior Authorization for Voquezna  10MG  tablets has been APPROVED from 06-06-2024 to 07-03-2024. Ran test claim, Copay is $13.46 for a 30 day supply. This test claim was processed through [facility]- copay amounts may vary at other pharmacies due to pharmacy/plan contracts, or as the patient moves through the different stages of their insurance plan.   PA #/Case ID/Reference #: [id number]

## 2024-06-06 NOTE — Telephone Encounter (Signed)
 PA request has been Approved. New Encounter has been or will be created for follow up. For additional info see Pharmacy Prior Auth telephone encounter from 06-06-2024.

## 2024-06-10 ENCOUNTER — Other Ambulatory Visit: Payer: Self-pay

## 2024-06-10 ENCOUNTER — Other Ambulatory Visit (HOSPITAL_COMMUNITY): Payer: Self-pay

## 2024-06-11 NOTE — Telephone Encounter (Signed)
 Additional information has been requested from the patient's insurance in order to proceed with the prior authorization request. Requested information has been sent, or form has been filled out and faxed back to [phone]

## 2024-06-12 ENCOUNTER — Other Ambulatory Visit (HOSPITAL_COMMUNITY): Payer: Self-pay

## 2024-06-12 ENCOUNTER — Encounter: Payer: Self-pay | Admitting: Internal Medicine

## 2024-06-12 ENCOUNTER — Other Ambulatory Visit: Payer: Self-pay

## 2024-06-12 ENCOUNTER — Ambulatory Visit: Admitting: Podiatry

## 2024-06-12 ENCOUNTER — Encounter: Payer: Self-pay | Admitting: Podiatry

## 2024-06-12 ENCOUNTER — Ambulatory Visit: Admitting: Internal Medicine

## 2024-06-12 VITALS — BP 118/82 | HR 108 | Resp 16 | Ht 64.0 in | Wt 125.8 lb

## 2024-06-12 DIAGNOSIS — K219 Gastro-esophageal reflux disease without esophagitis: Secondary | ICD-10-CM

## 2024-06-12 DIAGNOSIS — E039 Hypothyroidism, unspecified: Secondary | ICD-10-CM

## 2024-06-12 DIAGNOSIS — Z0001 Encounter for general adult medical examination with abnormal findings: Secondary | ICD-10-CM

## 2024-06-12 DIAGNOSIS — L600 Ingrowing nail: Secondary | ICD-10-CM

## 2024-06-12 DIAGNOSIS — Z23 Encounter for immunization: Secondary | ICD-10-CM

## 2024-06-12 DIAGNOSIS — M349 Systemic sclerosis, unspecified: Secondary | ICD-10-CM

## 2024-06-12 DIAGNOSIS — J849 Interstitial pulmonary disease, unspecified: Secondary | ICD-10-CM

## 2024-06-12 DIAGNOSIS — I272 Pulmonary hypertension, unspecified: Secondary | ICD-10-CM

## 2024-06-12 DIAGNOSIS — R053 Chronic cough: Secondary | ICD-10-CM | POA: Insufficient documentation

## 2024-06-12 DIAGNOSIS — I10 Essential (primary) hypertension: Secondary | ICD-10-CM

## 2024-06-12 MED ORDER — CHLORTHALIDONE 25 MG PO TABS
25.0000 mg | ORAL_TABLET | Freq: Every day | ORAL | 3 refills | Status: AC
  Filled 2024-06-12 (×2): qty 90, 90d supply, fill #0

## 2024-06-12 MED ORDER — HYDRALAZINE HCL 50 MG PO TABS
50.0000 mg | ORAL_TABLET | Freq: Two times a day (BID) | ORAL | 3 refills | Status: AC
  Filled 2024-06-12 (×3): qty 180, 90d supply, fill #0

## 2024-06-12 MED ORDER — BENZONATATE 200 MG PO CAPS
200.0000 mg | ORAL_CAPSULE | Freq: Two times a day (BID) | ORAL | 1 refills | Status: AC | PRN
  Filled 2024-06-12: qty 30, 15d supply, fill #0

## 2024-06-12 MED ORDER — OLMESARTAN MEDOXOMIL 20 MG PO TABS
20.0000 mg | ORAL_TABLET | Freq: Every day | ORAL | 3 refills | Status: AC
  Filled 2024-06-12 (×4): qty 90, 90d supply, fill #0

## 2024-06-12 NOTE — Telephone Encounter (Signed)
 Pharmacy Patient Advocate Encounter  Received notification from [facility] that Prior Authorization for NP Thyroid  120MG  tablets  has been APPROVED from 06/11/24 to 06/10/25. Ran test claim, Copay is $15. This test claim was processed through [facility]- copay amounts may vary at other pharmacies due to pharmacy/plan contracts, or as the patient moves through the different stages of their insurance plan.   PA #/Case ID/Reference #: [id number]

## 2024-06-12 NOTE — Patient Instructions (Signed)
Please continue to take medications as prescribed.  Please continue to follow low salt diet and perform moderate exercise/walking at least 150 mins/week. 

## 2024-06-12 NOTE — Assessment & Plan Note (Addendum)
 Followed by ILD clinic and Rheumatology On Ofev  and CellCept  Needs follow-up with ILD clinic

## 2024-06-12 NOTE — Assessment & Plan Note (Signed)
 On Voquezna  for GERD Avoid hot and spicy food Has tried omeprazole, pantoprazole  and Nexium  in the past Followed by GI

## 2024-06-12 NOTE — Assessment & Plan Note (Addendum)
 BP Readings from Last 1 Encounters:  06/12/24 118/82   Well-controlled with olmesartan  at 20 mg once daily, Zebeta  5 mg once daily, hydralazine  50 mg BID and chlorthalidone  25 mg QD Counseled for compliance with the medications Advised DASH diet and moderate exercise/walking, at least 150 mins/week

## 2024-06-12 NOTE — Assessment & Plan Note (Signed)
 Has chronic dry cough, could be related to ILD Responds well to Tessalon , refilled

## 2024-06-12 NOTE — Assessment & Plan Note (Signed)
 Lab Results  Component Value Date   TSH 3.550 02/19/2024   On NP thyroid  120 mg QD -needs to take it in a.m. on empty stomach Recheck TSH and free T4

## 2024-06-12 NOTE — Assessment & Plan Note (Addendum)
 Followed by Dr. Ishmael Has multiple complications - ILD, pulmonary HTN, esophageal dysphagia and SBO

## 2024-06-12 NOTE — Assessment & Plan Note (Signed)
Physical exam as documented. Fasting blood tests reviewed. Gets flu vaccine at her workplace.

## 2024-06-12 NOTE — Assessment & Plan Note (Signed)
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

## 2024-06-12 NOTE — Progress Notes (Signed)
 "  Established Patient Office Visit  Subjective:  Patient ID: Karen [name], female    DOB: [birth year]  Age: 54 y.o. MRN: [id number]  CC:  Chief Complaint  Patient presents with   Hypertension    4 month follow up     HPI Karen [name] is a 54 y.o. female with past medical history of HTN, scleroderma, ILD, pulmonary hypertension, dysphagia, hypothyroidism, chronic constipation and hot flashes who presents for f/u of her chronic medical conditions.  She has history of hypothyroidism, for which she takes NP thyroid . She currently denies any tremors, palpitations, or recent change in weight or appetite.   Essential hypertension and pulmonary hypertension: Her blood pressure is wnl today, and reports that her BP stays around 130s/70s at home. She takes olmesartan  20 mg QD, Chlorthalidone  25 mg QD, Zebeta  5 mg QD and Hydralazine  50 mg BID. She had to stop amlodipine  in the past due to worsening of leg swelling.  She has history of pulmonary hypertension from ILD, for which she takes Opsumit . She follows up with CHF clinic. She has chronic, intermittent leg swelling.  She reports chronic cough, but denies any fever, chills, or recent worsening of dyspnea.  She has chronic nasal congestion and reports history of environmental allergies.  She has tried multiple antihistaminics including Zyrtec, Xyzal, etc.  She has history of scleroderma, for which she sees Dr. Ishmael. She is on Cellcept  now.  She sees Dr. 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  She is on Ofev  for it.  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   Cerebral vascular disease 01/23/2024   Cervical radicular pain  08/08/2023   CHF (congestive heart failure) (HCC)    Chronic diastolic CHF (congestive heart failure) (HCC) 10/11/2022   Chronic GERD 12/18/2017   Chronic nonintractable headache 09/08/2021   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   Perimenopause 02/27/2019   Peripheral polyneuropathy 01/23/2024   Primary osteoarthritis 04/07/2021   Pulmonary edema    Pulmonary hypertension (HCC) 08/04/2021   Raynaud's disease 11/10/2021   Scleroderma (HCC)    Sjogren's syndrome 08/30/2017   Status post laparoscopic cholecystectomy 02/13/20 02/13/2020   Thyroid  nodule 08/04/2021   Vitamin D  deficiency disease 02/27/2019    Past Surgical History:  Procedure Laterality Date   ABDOMINAL HERNIA REPAIR     BIOPSY OF SKIN SUBCUTANEOUS TISSUE AND/OR MUCOUS MEMBRANE  08/02/2023   Procedure: BIOPSY, SKIN, SUBCUTANEOUS TISSUE, OR MUCOUS MEMBRANE;  Surgeon: Leigh Elspeth SQUIBB, MD;  Location: [facility];  Service: Gastroenterology;;   ROMAYNE Bilateral 10/2018   CHOLECYSTECTOMY N/A 02/13/2020   Procedure: LAPAROSCOPIC CHOLECYSTECTOMY;  Surgeon: Mavis Anes, MD;  Location: [facility];  Service: General;  Laterality: N/A;   COLONOSCOPY WITH PROPOFOL  N/A 01/02/2019   Procedure: COLONOSCOPY WITH PROPOFOL ;  Surgeon: Shaaron Lamar HERO, MD;  Location: [facility];  Service: Endoscopy;  Laterality: N/A;  12:30pm   ESOPHAGOGASTRODUODENOSCOPY (EGD) WITH PROPOFOL  N/A 01/28/2018   erosive reflux esophagitis, patulous EG Junction, no dilation,  incomplete EGD due to retained food in stomach. GES thereafter with delayed gastric emptying.    ESOPHAGOGASTRODUODENOSCOPY (EGD) WITH PROPOFOL  N/A 08/02/2023   Procedure: ESOPHAGOGASTRODUODENOSCOPY (EGD) WITH PROPOFOL ;  Surgeon: Leigh Elspeth SQUIBB, MD;  Location: [facility];  Service: Gastroenterology;  Laterality: N/A;   RIGHT HEART CATH N/A 09/27/2017   Procedure: RIGHT HEART CATH;  Surgeon: Cherrie Toribio SAUNDERS, MD;  Location: [facility];  Service: Cardiovascular;  Laterality: N/A;   RIGHT HEART CATH N/A 09/05/2019   Procedure: RIGHT HEART CATH;  Surgeon: Cherrie Toribio SAUNDERS, MD;  Location: [facility];  Service: Cardiovascular;  Laterality: N/A;   RIGHT/LEFT HEART CATH AND CORONARY ANGIOGRAPHY N/A 08/18/2021   Procedure: RIGHT/LEFT HEART CATH AND CORONARY ANGIOGRAPHY;  Surgeon: Cherrie Toribio SAUNDERS, MD;  Location: [facility];  Service: Cardiovascular;  Laterality: N/A;   UTERINE FIBROID SURGERY      Family History  Problem Relation Age of Onset   Hypertension Father    Hypertension Sister    Hypertension Brother    Diabetes Maternal Grandfather    Diabetes Maternal Uncle    Diabetes Mother    Colon cancer Neg Hx     Social History   Socioeconomic History   Marital status: Single    Spouse name: Not on file   Number of children: 0   Years of education: Not on file   Highest education level: Associate degree: occupational, scientist, product/process development, or vocational program  Occupational History   Occupation: CMA    Employer: [employer]   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 She lives alone in one level home, a few steps to enter.CMA [employer].   Social Drivers of Health   Tobacco  Use: Low Risk (06/12/2024)   Patient History    Smoking Tobacco Use: Never    Smokeless Tobacco Use: Never    Passive Exposure: Not on file  Financial Resource Strain: Low Risk (12/25/2023)   Received from [facility]   Overall Financial Resource Strain (CARDIA)    How hard is it for you to pay for the very basics like food, housing, medical care, and heating?: Not very hard  Food Insecurity: No Food Insecurity (02/20/2024)   Epic    Worried About Radiation Protection Practitioner of Food in the Last Year: Never true    Ran Out of Food in the Last Year: Never true  Transportation Needs: No Transportation Needs (02/20/2024)   Epic    Lack of Transportation (Medical): No    Lack of Transportation (Non-Medical): No  Physical Activity: Inactive (12/25/2023)   [facility]   Exercise Vital Sign    On average, how many days per week do you engage in moderate to strenuous exercise (like a brisk walk)?: 0 days    On average, how many minutes do you engage in exercise at this level?: 0 min  Stress: No Stress Concern Present (12/25/2023)   Received from [facility]   [employer] of Occupational Health - Occupational Stress Questionnaire    Do you feel stress - tense, restless, nervous, or anxious, or unable to sleep at night because your mind is troubled all the time - these days?: Only a little  Social Connections: Socially Integrated (12/25/2023)   Received from [facility]   Social Connection and Isolation Panel    In a typical week, how many times do you talk on the phone with family, friends, or neighbors?: More than three times a week    How often do you get together with friends or relatives?: More than three times a week    How often do you attend church or religious services?: More than 4 times per year    Do you belong to any clubs or organizations such as church groups, unions, fraternal or athletic groups, or school groups?: Yes    How often do you attend meetings of the  clubs or organizations you belong to?: 1 to 4 times per year    Are you married, widowed, divorced, separated, never married, or living with a partner?: Living with partner  Intimate Partner Violence: Not At Risk (02/20/2024)   Epic    Fear of Current or Ex-Partner: No    Emotionally Abused: No    Physically Abused: No    Sexually Abused: No  Depression (PHQ2-9): Low Risk (06/12/2024)   Depression (PHQ2-9)    PHQ-2 Score: 0  Alcohol Screen: Not on file  Housing: Low Risk (02/20/2024)   Epic    Unable to Pay for Housing in the Last Year: No    Number of Times Moved in the Last Year: 0    Homeless in the Last Year: No  Utilities: Not At Risk (02/20/2024)   Epic    Threatened with loss of utilities: No  Health Literacy: Low Risk (12/25/2023)   Received from [facility]    How often do you need to have someone help you when you read instructions, pamphlets, or other written material from your doctor or pharmacy?: Never    Outpatient Medications Prior to Visit  Medication Sig Dispense Refill   acetaminophen  (TYLENOL ) 325 MG tablet Take 2 tablets (650 mg total) by mouth every 6 (six) hours as needed for mild pain, fever or headache (or Fever >/= 101). 12 tablet 0   aspirin  81 MG chewable tablet Chew 1 tablet (81 mg total) by mouth daily. 30 tablet 0   bisoprolol  (ZEBETA ) 5 MG tablet Take 1 tablet (5 mg total) by mouth daily. 90 tablet 1   macitentan  (OPSUMIT ) 10 MG tablet Take 1 tablet (10 mg total) by mouth daily. 30 tablet 11   mycophenolate  (CELLCEPT ) 200 MG/ML suspension Take 2.64mL (500mg  total) by mouth twice daily for 14 days. THEN take 5mL (1,000mg  total) by mouth twice daily for 14 days. THEN, take 7.25mL (1,500mg ) by mouth twice daily thereafter. 320 mL 0   Nintedanib  (OFEV ) 150 MG CAPS Take 1 capsule (150 mg total) by mouth 2 (two) times daily. 60 capsule 3   NP THYROID  120 MG tablet Take 1 tablet (120 mg total) by mouth daily before breakfast. 90 tablet 0    ondansetron  (ZOFRAN ) 4 MG tablet Take 1 tablet (4 mg total) by mouth every 6 (six) hours as needed for nausea. 15 tablet 0   traZODone  (DESYREL ) 50 MG tablet Take 1/2-1 tablets (25-50 mg total) by mouth at bedtime as needed for sleep. 30 tablet  3   Vonoprazan Fumarate  (VOQUEZNA ) 10 MG TABS Take 1 tablet by mouth daily. 30 tablet 2   chlorthalidone  (HYGROTON ) 25 MG tablet Take 1 tablet (25 mg total) by mouth daily. 30 tablet 1   hydrALAZINE  (APRESOLINE ) 50 MG tablet Take 1 tablet (50 mg total) by mouth 3 (three) times daily. 90 tablet 3   olmesartan  (BENICAR ) 20 MG tablet Take 1 tablet (20 mg total) by mouth daily. 30 tablet 3   No facility-administered medications prior to visit.    Allergies  Allergen Reactions   Tocilizumab  Shortness Of Breath, Swelling and Other (See Comments)    Swelling and shortness of breath sharpe pains in back and chest tightness. Vancomycin Infusion Reaction   Lisinopril Hives, Swelling and Other (See Comments)    ROS Review of Systems  Constitutional:  Positive for fatigue. Negative for chills and fever.  HENT:  Negative for congestion, sinus pressure, sinus pain and sore throat.   Eyes:  Negative for pain and discharge.  Respiratory:  Positive for cough and shortness of breath (intermittent).   Cardiovascular:  Negative for chest pain and palpitations.  Gastrointestinal:  Positive for constipation. Negative for abdominal pain, nausea and vomiting.  Endocrine: Negative for polydipsia and polyuria.  Genitourinary:  Negative for dysuria and hematuria.  Musculoskeletal:  Positive for arthralgias and neck pain. Negative for neck stiffness.  Skin:  Negative for rash.  Allergic/Immunologic: Positive for environmental allergies.  Neurological:  Positive for numbness (LUE). Negative for dizziness and weakness.  Psychiatric/Behavioral:  Negative for agitation and behavioral problems.       Objective:    Physical Exam Vitals reviewed.  Constitutional:       General: She is not in acute distress.    Appearance: She is not diaphoretic.  HENT:     Head: Normocephalic and atraumatic.     Nose: No congestion.     Mouth/Throat:     Mouth: Mucous membranes are moist.  Eyes:     General: No scleral icterus.    Extraocular Movements: Extraocular movements intact.  Cardiovascular:     Rate and Rhythm: Normal rate and regular rhythm.     Heart sounds: Normal heart sounds. No murmur heard. Pulmonary:     Breath sounds: Normal breath sounds. No wheezing or rales.  Abdominal:     Palpations: Abdomen is soft.     Tenderness: There is no abdominal tenderness.  Musculoskeletal:     Cervical back: Neck supple. Tenderness present.     Right lower leg: Edema (Mild) present.     Left lower leg: Edema (Mild) present.  Skin:    General: Skin is warm.     Findings: No rash.  Neurological:     General: No focal deficit present.     Mental Status: She is alert and oriented to person, place, and time.     Sensory: No sensory deficit.     Motor: No weakness.  Psychiatric:        Mood and Affect: Mood normal.        Behavior: Behavior normal.     BP 118/82   Pulse (!) 108   Resp 16   Ht 5' 4 (1.626 m)   Wt 125 lb 12.8 oz (57.1 kg)   SpO2 93%   BMI 21.59 kg/m  Wt Readings from Last 3 Encounters:  06/12/24 125 lb 12.8 oz (57.1 kg)  04/24/24 127 lb 6.4 oz (57.8 kg)  04/17/24 127 lb (57.6 kg)    Lab Results  Component Value Date   TSH 3.550 02/19/2024   Lab Results  Component Value Date   WBC 9.6 04/17/2024   HGB 11.7 (L) 04/17/2024   HCT 36.1 04/17/2024   MCV 84.5 04/17/2024   PLT 225.0 04/17/2024   Lab Results  Component Value Date   NA 138 04/17/2024   K 3.2 (L) 04/17/2024   CO2 32 04/17/2024   GLUCOSE 81 04/17/2024   BUN 12 04/17/2024   CREATININE 0.56 04/17/2024   BILITOT 0.5 04/17/2024   ALKPHOS 86 04/17/2024   AST 24 04/17/2024   ALT 11 04/17/2024   PROT 7.4 04/17/2024   ALBUMIN 4.2 04/17/2024   CALCIUM 10.7 (H)  04/17/2024   ANIONGAP 10 02/21/2024   EGFR 94 02/12/2024   GFR 102.37 04/17/2024   Lab Results  Component Value Date   CHOL 194 02/12/2024   Lab Results  Component Value Date   HDL 48 02/12/2024   Lab Results  Component Value Date   LDLCALC 128 (H) 02/12/2024   Lab Results  Component Value Date   TRIG 100 02/12/2024   Lab Results  Component Value Date   CHOLHDL 4.0 02/12/2024   Lab Results  Component Value Date   HGBA1C 5.7 (H) 02/12/2024      Assessment & Plan:   Problem List Items Addressed This Visit       Cardiovascular and Mediastinum   HTN (hypertension)   BP Readings from Last 1 Encounters:  06/12/24 118/82   Well-controlled with olmesartan  at 20 mg once daily, Zebeta  5 mg once daily, hydralazine  50 mg BID and chlorthalidone  25 mg QD Counseled for compliance with the medications Advised DASH diet and moderate exercise/walking, at least 150 mins/week      Relevant Medications   chlorthalidone  (HYGROTON ) 25 MG tablet   hydrALAZINE  (APRESOLINE ) 50 MG tablet   olmesartan  (BENICAR ) 20 MG tablet   Other Relevant Orders   CMP14+EGFR   CBC   Pulmonary hypertension (HCC)   Likely from ILD due to scleroderma Followed by CHF clinic Appears euvolemic currently, on spironolactone  and as needed Lasix  currently On Opsumit       Relevant Medications   chlorthalidone  (HYGROTON ) 25 MG tablet   hydrALAZINE  (APRESOLINE ) 50 MG tablet   olmesartan  (BENICAR ) 20 MG tablet     Respiratory   ILD (interstitial lung disease) (HCC)   Followed by ILD clinic and Rheumatology On Ofev  and CellCept  Needs follow-up with ILD clinic        Digestive   GERD (gastroesophageal reflux disease)   On Voquezna  for GERD Avoid hot and spicy food Has tried omeprazole, pantoprazole  and Nexium  in the past Followed by GI        Endocrine   Hypothyroidism   Lab Results  Component Value Date   TSH 3.550 02/19/2024   On NP thyroid  120 mg QD -needs to take it in a.m. on empty  stomach Recheck TSH and free T4      Relevant Orders   TSH + free T4     Other   Scleroderma (HCC) (Chronic)   Followed by Dr. Ishmael Has multiple complications - ILD, pulmonary HTN, esophageal dysphagia and SBO      Encounter for general adult medical examination with abnormal findings - Primary   Physical exam as documented. Fasting blood tests reviewed. Gets flu vaccine at her workplace.      Chronic cough   Has chronic dry cough, could be related to ILD Responds well to Tessalon , refilled  Relevant Medications   benzonatate  (TESSALON ) 200 MG capsule      Meds ordered this encounter  Medications   chlorthalidone  (HYGROTON ) 25 MG tablet    Sig: Take 1 tablet (25 mg total) by mouth daily.    Dispense:  90 tablet    Refill:  3   hydrALAZINE  (APRESOLINE ) 50 MG tablet    Sig: Take 1 tablet (50 mg total) by mouth 2 (two) times daily.    Dispense:  180 tablet    Refill:  3   olmesartan  (BENICAR ) 20 MG tablet    Sig: Take 1 tablet (20 mg total) by mouth daily.    Dispense:  90 tablet    Refill:  3   benzonatate  (TESSALON ) 200 MG capsule    Sig: Take 1 capsule (200 mg total) by mouth 2 (two) times daily as needed for cough.    Dispense:  30 capsule    Refill:  1    Follow-up: Return in about 6 months (around 12/10/2024).    Suzzane MARLA Blanch, MD "

## 2024-06-13 ENCOUNTER — Other Ambulatory Visit: Payer: Self-pay

## 2024-06-13 ENCOUNTER — Ambulatory Visit: Payer: Self-pay | Admitting: Internal Medicine

## 2024-06-13 ENCOUNTER — Other Ambulatory Visit (HOSPITAL_COMMUNITY): Payer: Self-pay

## 2024-06-13 LAB — CMP14+EGFR
ALT: 12 [IU]/L (ref 0–32)
AST: 25 [IU]/L (ref 0–40)
Albumin: 4.6 g/dL (ref 3.8–4.9)
Alkaline Phosphatase: 111 [IU]/L (ref 49–135)
BUN/Creatinine Ratio: 18 (ref 9–23)
BUN: 11 mg/dL (ref 6–24)
Bilirubin Total: 0.6 mg/dL (ref 0.0–1.2)
CO2: 23 mmol/L (ref 20–29)
Calcium: 11 mg/dL — ABNORMAL HIGH (ref 8.7–10.2)
Chloride: 101 mmol/L (ref 96–106)
Creatinine, Ser: 0.62 mg/dL (ref 0.57–1.00)
Globulin, Total: 2.8 g/dL (ref 1.5–4.5)
Glucose: 82 mg/dL (ref 70–99)
Potassium: 3.6 mmol/L (ref 3.5–5.2)
Sodium: 144 mmol/L (ref 134–144)
Total Protein: 7.4 g/dL (ref 6.0–8.5)
eGFR: 104 mL/min/{1.73_m2}

## 2024-06-13 LAB — CBC
Hematocrit: 40.8 % (ref 34.0–46.6)
Hemoglobin: 13.1 g/dL (ref 11.1–15.9)
MCH: 27.5 pg (ref 26.6–33.0)
MCHC: 32.1 g/dL (ref 31.5–35.7)
MCV: 86 fL (ref 79–97)
Platelets: 273 10*3/uL (ref 150–450)
RBC: 4.77 x10E6/uL (ref 3.77–5.28)
RDW: 13 % (ref 11.7–15.4)
WBC: 9.1 10*3/uL (ref 3.4–10.8)

## 2024-06-13 LAB — TSH+FREE T4
Free T4: 1.33 ng/dL (ref 0.82–1.77)
TSH: 1.3 u[IU]/mL (ref 0.450–4.500)

## 2024-06-13 NOTE — Progress Notes (Signed)
 She presents today for a nail check of her fourth toe right foot.  States that is still pretty sore but not as bad as it was continues to soak on a regular basis.  Objective: Vital signs are stable alert oriented x 3 fourth digit right foot does demonstrate some postinflammatory hyperpigmentation but no purulence drainage malodor but a small amount of tenderness along the surgical site.  I do think this is probably exacerbated secondary to the third toe laying over top of that medial margin.  Assessment: Well-healing surgical toe.  Plan: Encouraged her to soak Epsom salts and warm water where there is daily or every other day leave open at nighttime but cover during the daytime.  She understands this and is amenable to it we will follow-up with me on an as-needed basis.

## 2024-07-24 ENCOUNTER — Ambulatory Visit: Admitting: Pulmonary Disease

## 2024-12-11 ENCOUNTER — Ambulatory Visit: Payer: Self-pay | Admitting: Internal Medicine
